# Patient Record
Sex: Female | Born: 1960
Health system: Southern US, Community
[De-identification: ages and names within clinical notes are randomized; demographics above are authoritative.]

## PROBLEM LIST (undated history)

## (undated) DIAGNOSIS — K760 Fatty (change of) liver, not elsewhere classified: Secondary | ICD-10-CM

## (undated) DIAGNOSIS — F32A Depression, unspecified: Secondary | ICD-10-CM

## (undated) DIAGNOSIS — M1990 Unspecified osteoarthritis, unspecified site: Secondary | ICD-10-CM

## (undated) DIAGNOSIS — E119 Type 2 diabetes mellitus without complications: Secondary | ICD-10-CM

## (undated) DIAGNOSIS — C801 Malignant (primary) neoplasm, unspecified: Secondary | ICD-10-CM

## (undated) DIAGNOSIS — T8859XA Other complications of anesthesia, initial encounter: Secondary | ICD-10-CM

## (undated) DIAGNOSIS — F419 Anxiety disorder, unspecified: Secondary | ICD-10-CM

## (undated) DIAGNOSIS — E049 Nontoxic goiter, unspecified: Secondary | ICD-10-CM

## (undated) DIAGNOSIS — D649 Anemia, unspecified: Secondary | ICD-10-CM

## (undated) HISTORY — DX: Type 2 diabetes mellitus without complications: E11.9

## (undated) HISTORY — PX: TONSILLECTOMY: SUR1361

## (undated) HISTORY — PX: PARTIAL HYSTERECTOMY: SHX80

## (undated) HISTORY — PX: BOWEL RESECTION: SHX1257

---

## 2001-04-11 ENCOUNTER — Encounter: Payer: Self-pay | Admitting: Emergency Medicine

## 2001-04-11 ENCOUNTER — Emergency Department (HOSPITAL_COMMUNITY): Admission: EM | Admit: 2001-04-11 | Discharge: 2001-04-11 | Payer: Self-pay | Admitting: Emergency Medicine

## 2001-05-14 ENCOUNTER — Ambulatory Visit (HOSPITAL_COMMUNITY): Admission: RE | Admit: 2001-05-14 | Discharge: 2001-05-14 | Payer: Self-pay | Admitting: Obstetrics and Gynecology

## 2001-05-14 ENCOUNTER — Encounter (INDEPENDENT_AMBULATORY_CARE_PROVIDER_SITE_OTHER): Payer: Self-pay

## 2001-09-05 ENCOUNTER — Emergency Department (HOSPITAL_COMMUNITY): Admission: EM | Admit: 2001-09-05 | Discharge: 2001-09-05 | Payer: Self-pay | Admitting: Emergency Medicine

## 2001-09-07 ENCOUNTER — Emergency Department (HOSPITAL_COMMUNITY): Admission: EM | Admit: 2001-09-07 | Discharge: 2001-09-07 | Payer: Self-pay | Admitting: Emergency Medicine

## 2002-03-04 ENCOUNTER — Encounter: Admission: RE | Admit: 2002-03-04 | Discharge: 2002-03-04 | Payer: Self-pay | Admitting: Family Medicine

## 2003-03-02 ENCOUNTER — Encounter: Admission: RE | Admit: 2003-03-02 | Discharge: 2003-03-02 | Payer: Self-pay | Admitting: Sports Medicine

## 2004-01-10 ENCOUNTER — Emergency Department (HOSPITAL_COMMUNITY): Admission: EM | Admit: 2004-01-10 | Discharge: 2004-01-10 | Payer: Self-pay | Admitting: Emergency Medicine

## 2004-08-03 ENCOUNTER — Encounter: Admission: RE | Admit: 2004-08-03 | Discharge: 2004-08-03 | Payer: Self-pay | Admitting: Family Medicine

## 2004-10-06 ENCOUNTER — Emergency Department (HOSPITAL_COMMUNITY): Admission: EM | Admit: 2004-10-06 | Discharge: 2004-10-07 | Payer: Self-pay | Admitting: Emergency Medicine

## 2005-02-13 ENCOUNTER — Ambulatory Visit: Payer: Self-pay | Admitting: Family Medicine

## 2007-06-23 ENCOUNTER — Ambulatory Visit: Payer: Self-pay | Admitting: Family Medicine

## 2008-09-19 ENCOUNTER — Ambulatory Visit: Payer: Self-pay | Admitting: Family Medicine

## 2008-09-19 ENCOUNTER — Telehealth: Payer: Self-pay | Admitting: *Deleted

## 2008-09-19 ENCOUNTER — Encounter: Payer: Self-pay | Admitting: Family Medicine

## 2008-09-19 LAB — CONVERTED CEMR LAB: Beta hcg, urine, semiquantitative: NEGATIVE

## 2008-09-20 LAB — CONVERTED CEMR LAB
HCT: 37 % (ref 36.0–46.0)
Hemoglobin: 10.9 g/dL — ABNORMAL LOW (ref 12.0–15.0)
MCHC: 29.5 g/dL — ABNORMAL LOW (ref 30.0–36.0)
MCV: 73.9 fL — ABNORMAL LOW (ref 78.0–100.0)
Platelets: 428 10*3/uL — ABNORMAL HIGH (ref 150–400)
RBC: 5.01 M/uL (ref 3.87–5.11)
RDW: 18.2 % — ABNORMAL HIGH (ref 11.5–15.5)
TSH: 1.759 microintl units/mL (ref 0.350–4.50)
WBC: 5.7 10*3/uL (ref 4.0–10.5)

## 2008-09-23 ENCOUNTER — Encounter: Admission: RE | Admit: 2008-09-23 | Discharge: 2008-09-23 | Payer: Self-pay | Admitting: Family Medicine

## 2008-09-28 ENCOUNTER — Ambulatory Visit: Payer: Self-pay | Admitting: Family Medicine

## 2008-09-28 DIAGNOSIS — D509 Iron deficiency anemia, unspecified: Secondary | ICD-10-CM | POA: Insufficient documentation

## 2008-09-29 ENCOUNTER — Inpatient Hospital Stay (HOSPITAL_COMMUNITY): Admission: AD | Admit: 2008-09-29 | Discharge: 2008-09-29 | Payer: Self-pay | Admitting: Obstetrics & Gynecology

## 2008-09-29 ENCOUNTER — Telehealth: Payer: Self-pay | Admitting: *Deleted

## 2008-10-03 ENCOUNTER — Telehealth: Payer: Self-pay | Admitting: *Deleted

## 2008-10-06 ENCOUNTER — Encounter: Payer: Self-pay | Admitting: *Deleted

## 2008-11-23 ENCOUNTER — Other Ambulatory Visit: Admission: RE | Admit: 2008-11-23 | Discharge: 2008-11-23 | Payer: Self-pay | Admitting: Obstetrics and Gynecology

## 2008-11-23 ENCOUNTER — Ambulatory Visit: Payer: Self-pay | Admitting: Obstetrics and Gynecology

## 2008-11-23 ENCOUNTER — Encounter (INDEPENDENT_AMBULATORY_CARE_PROVIDER_SITE_OTHER): Payer: Self-pay | Admitting: Family Medicine

## 2008-12-30 ENCOUNTER — Ambulatory Visit: Payer: Self-pay | Admitting: Obstetrics & Gynecology

## 2009-05-12 ENCOUNTER — Encounter: Payer: Self-pay | Admitting: *Deleted

## 2009-05-29 ENCOUNTER — Ambulatory Visit: Payer: Self-pay | Admitting: Family Medicine

## 2009-05-29 DIAGNOSIS — E669 Obesity, unspecified: Secondary | ICD-10-CM | POA: Insufficient documentation

## 2009-05-29 LAB — CONVERTED CEMR LAB
ALT: 13 units/L (ref 0–35)
AST: 17 units/L (ref 0–37)
Albumin: 3.9 g/dL (ref 3.5–5.2)
Alkaline Phosphatase: 44 units/L (ref 39–117)
BUN: 14 mg/dL (ref 6–23)
CO2: 22 meq/L (ref 19–32)
Calcium: 8.8 mg/dL (ref 8.4–10.5)
Chloride: 108 meq/L (ref 96–112)
Cholesterol: 143 mg/dL (ref 0–200)
Creatinine, Ser: 0.71 mg/dL (ref 0.40–1.20)
Glucose, Bld: 103 mg/dL — ABNORMAL HIGH (ref 70–99)
HCT: 32.4 % — ABNORMAL LOW (ref 36.0–46.0)
HDL: 40 mg/dL (ref >39)
Hemoglobin: 9.3 g/dL — ABNORMAL LOW (ref 12.0–15.0)
LDL Cholesterol: 72 mg/dL (ref 0–99)
MCHC: 28.7 g/dL — ABNORMAL LOW (ref 30.0–36.0)
MCV: 72.3 fL — ABNORMAL LOW (ref 78.0–100.0)
Platelets: 407 10*3/uL — ABNORMAL HIGH (ref 150–400)
Potassium: 4.3 meq/L (ref 3.5–5.3)
RBC: 4.48 M/uL (ref 3.87–5.11)
RDW: 20.1 % — ABNORMAL HIGH (ref 11.5–15.5)
Sodium: 140 meq/L (ref 135–145)
Total Bilirubin: 0.3 mg/dL (ref 0.3–1.2)
Total CHOL/HDL Ratio: 3.6
Total Protein: 6.9 g/dL (ref 6.0–8.3)
Triglycerides: 156 mg/dL — ABNORMAL HIGH (ref <150)
VLDL: 31 mg/dL (ref 0–40)
WBC: 4.2 10*3/uL (ref 4.0–10.5)

## 2009-05-30 ENCOUNTER — Encounter: Payer: Self-pay | Admitting: Family Medicine

## 2009-06-07 ENCOUNTER — Encounter: Payer: Self-pay | Admitting: Family Medicine

## 2009-06-28 ENCOUNTER — Telehealth: Payer: Self-pay | Admitting: Family Medicine

## 2009-07-28 ENCOUNTER — Ambulatory Visit: Payer: Self-pay | Admitting: Obstetrics & Gynecology

## 2009-08-17 ENCOUNTER — Ambulatory Visit: Payer: Self-pay | Admitting: Obstetrics & Gynecology

## 2009-08-21 ENCOUNTER — Ambulatory Visit (HOSPITAL_COMMUNITY): Admission: RE | Admit: 2009-08-21 | Discharge: 2009-08-21 | Payer: Self-pay | Admitting: Obstetrics & Gynecology

## 2009-09-01 ENCOUNTER — Encounter (INDEPENDENT_AMBULATORY_CARE_PROVIDER_SITE_OTHER): Payer: Self-pay | Admitting: *Deleted

## 2009-09-08 ENCOUNTER — Ambulatory Visit: Payer: Self-pay | Admitting: Family Medicine

## 2009-09-11 ENCOUNTER — Ambulatory Visit: Payer: Self-pay | Admitting: Family Medicine

## 2009-09-21 ENCOUNTER — Encounter: Payer: Self-pay | Admitting: Obstetrics & Gynecology

## 2009-09-21 ENCOUNTER — Ambulatory Visit: Payer: Self-pay | Admitting: Obstetrics & Gynecology

## 2009-09-21 ENCOUNTER — Inpatient Hospital Stay (HOSPITAL_COMMUNITY): Admission: RE | Admit: 2009-09-21 | Discharge: 2009-09-23 | Payer: Self-pay | Admitting: Obstetrics & Gynecology

## 2009-10-12 ENCOUNTER — Ambulatory Visit: Payer: Self-pay | Admitting: Obstetrics and Gynecology

## 2009-10-18 ENCOUNTER — Ambulatory Visit: Payer: Self-pay | Admitting: Obstetrics & Gynecology

## 2009-10-18 ENCOUNTER — Inpatient Hospital Stay (HOSPITAL_COMMUNITY): Admission: AD | Admit: 2009-10-18 | Discharge: 2009-10-18 | Payer: Self-pay | Admitting: Obstetrics & Gynecology

## 2009-11-02 ENCOUNTER — Ambulatory Visit: Payer: Self-pay | Admitting: Obstetrics and Gynecology

## 2011-01-21 ENCOUNTER — Ambulatory Visit: Admission: RE | Admit: 2011-01-21 | Discharge: 2011-01-21 | Payer: Self-pay | Source: Home / Self Care

## 2011-01-21 ENCOUNTER — Encounter: Payer: Self-pay | Admitting: Family Medicine

## 2011-01-21 LAB — CONVERTED CEMR LAB
Glucose, Bld: 116 mg/dL — ABNORMAL HIGH (ref 70–99)
HCT: 38.3 % (ref 36.0–46.0)
Hemoglobin: 12.3 g/dL (ref 12.0–15.0)
MCHC: 32.1 g/dL (ref 30.0–36.0)
MCV: 82.2 fL (ref 78.0–100.0)
Platelets: 353 10*3/uL (ref 150–400)
RBC: 4.66 M/uL (ref 3.87–5.11)
RDW: 15.4 % (ref 11.5–15.5)
WBC: 6 10*3/uL (ref 4.0–10.5)

## 2011-01-21 LAB — CBC
HCT: 38.3 % (ref 36.0–46.0)
Hemoglobin: 12.3 g/dL (ref 12.0–15.0)
MCH: 26.4 pg (ref 26.0–34.0)
MCHC: 32.1 g/dL (ref 30.0–36.0)
MCV: 82.2 fL (ref 78.0–100.0)
Platelets: 353 10*3/uL (ref 150–400)
RBC: 4.66 MIL/uL (ref 3.87–5.11)
RDW: 15.4 % (ref 11.5–15.5)
WBC: 6 10*3/uL (ref 4.0–10.5)

## 2011-01-22 ENCOUNTER — Other Ambulatory Visit: Payer: Self-pay | Admitting: Family Medicine

## 2011-01-22 ENCOUNTER — Encounter: Payer: Self-pay | Admitting: Family Medicine

## 2011-01-22 LAB — GLUCOSE, RANDOM: Glucose, Bld: 116 mg/dL — ABNORMAL HIGH (ref 70–99)

## 2011-01-22 NOTE — Assessment & Plan Note (Signed)
Summary: f/u for uterine fibroid   Vital Signs:  Patient Profile:   50 Years Old Female Height:     66 inches (167.64 cm) Weight:      253.9 pounds BMI:     41.13 Temp:     98.3 degrees F oral Pulse rate:   72 / minute BP sitting:   123 / 82  (right arm)  Pt. in pain?   yes    Location:   RUQ    Intensity:   6  Vitals Entered By: Dedra Skeens CMA, (September 28, 2008 8:48 AM)                  PCP:  Pearlean Brownie MD  Chief Complaint:  f/u abd pain/menorrhagia.  History of Present Illness: 50yo AAF here for f/u abd pain/menorrhagia.  Abd pain/menorrhagia: s/p U/S that conveyed 5.3cm uterine fibroid.  Continues to have severe pain taking vicodin.  Continues to have heavy bleeding.  Currently does not intend to have future pregnancies.  Denies syncopal epidoses, fever, or SOB.      Current Allergies: No known allergies      Review of Systems      See HPI   Physical Exam  General:     Morbidly obese AAF, NAD    Impression & Recommendations:  Problem # 1:  FIBROIDS, UTERUS (ICD-218.9) Assessment: New 5.3 cm supracervical fibroid.  Discuss different options of tx: myomectomy vs. uterine artery emobliztion vs. hysterectomy vs. GnRH  vs. Mirena.  Plan to refer to gynecology.  Will provide Ultram for pain.  Will f/u after she has been seen and treated.   Orders: Gynecologic Referral (Gyn) [facility]- Est Level  3 ([address])   Problem # 2:  ANEMIA, IRON DEFICIENCY (ICD-280.9) Assessment: New Most likely 2/2 to iron deficiency given Hgb 10. 9 and MCV 73.9.  Will treat with Fe supplementation two times a day.     Her updated medication list for this problem includes:    Iron Supplement 325 (65 Fe) Mg Tabs (Ferrous sulfate) ..... One tablet by mouth two times a day  Orders: [facility]- Est Level  3 (60454)   Complete Medication List: 1)  Ultram 50 Mg Tabs (Tramadol hcl) .... One tablet by mouth every fours as needed for pain 2)  Iron Supplement 325 (65 Fe) Mg Tabs  (Ferrous sulfate) .... One tablet by mouth two times a day   Patient Instructions: 1)  Schedule f/u after you have been seen and treated by the gynecologist. 2)  Call the office if you have symptoms of passing out or fever (101).   Prescriptions: IRON SUPPLEMENT 325 (65 FE) MG TABS (FERROUS SULFATE) one tablet by mouth two times a day  #60 x 3   Entered and Authorized by:   Marisue Ivan  MD   Signed by:   Marisue Ivan  MD on 09/28/2008   Method used:   Handwritten   RxID:   [id number] ULTRAM 50 MG TABS (TRAMADOL HCL) one tablet by mouth every fours as needed for pain  #40 x 0   Entered and Authorized by:   Marisue Ivan  MD   Signed by:   Marisue Ivan  MD on 09/28/2008   Method used:   Handwritten   RxID:   [id number]  ]

## 2011-01-22 NOTE — Assessment & Plan Note (Signed)
Summary: cpe for work wk   Vital Signs:  Patient Profile:   50 Years Old Female Height:     66 inches (167.64 cm) Weight:      252 pounds (114.55 kg) BMI:     40.82 Temp:     97.9 degrees F (36.61 degrees C) Pulse rate:   71 / minute BP sitting:   126 / 85  (left arm)  Pt. in pain?   no  Vitals Entered By: Tomasa Rand (June 23, 2007 3:20 PM)                Chief Complaint:  CPE for work.  History of Present Illness: Need PE for change in jobs.  Feels well.  No problems except occaisional left arm tingling and aching.  Had for years.  Does not cause her any limitations.  Wants a female provider for a Pap smear  No chronic medications    Past Surgical History:    Tonsils as a child   Family History:    Colon Cancer in brother    Diabetes in nonfirst degree relative  Social History:    Will start to work at [employer] in [employer].     Risk Factors:  Tobacco use:  never   Review of Systems  The patient denies anorexia, weight loss, chest pain, syncope, dyspnea on exhertion, hemoptysis, abdominal pain, severe indigestion/heartburn, hematuria, muscle weakness, suspicious skin lesions, difficulty walking, depression, and enlarged lymph nodes.     Physical Exam  General:     Well-developed,well-nourished,in no acute distress; alert,appropriate and cooperative throughout examination Head:     Normocephalic and atraumatic without obvious abnormalities. No apparent alopecia or balding. Eyes:     No corneal or conjunctival inflammation noted. EOMI. Perrla. Vision grossly normal. Ears:     External ear exam shows no significant lesions or deformities.  Otoscopic examination reveals clear canals, tympanic membranes are intact bilaterally without bulging, retraction, inflammation or discharge. Hearing is grossly normal bilaterally. Mouth:     Oral mucosa and oropharynx without lesions or exudates.  Teeth in good repair. Neck:     No deformities, masses, or  tenderness noted. Lungs:     Normal respiratory effort, chest expands symmetrically. Lungs are clear to auscultation, no crackles or wheezes. Heart:     Normal rate and regular rhythm. S1 and S2 normal without gallop, murmur, click, rub or other extra sounds. Abdomen:     Bowel sounds positive,abdomen soft and non-tender without masses, organomegaly or hernias noted. Msk:     No deformity or scoliosis noted of thoracic or lumbar spine.   Extremities:     No clubbing, cyanosis, edema, or deformity noted with normal full range of motion of all joints.      Impression & Recommendations:  Problem # 1:  ROUTINE GENERAL MEDICAL EXAM, NON PEDIATRIC (ICD-V70.0) Normal exam.   Will make appointment to have Pap.  Discussed weight loss and exercise Orders: [facility] - Est  40-64 yrs (010[address])    Patient Instructions: 1)  Schedule appointment for a PAP smear 2)  You need to lose weight. Consider a lower calorie diet and regular exercise.  3)  It is important that you exercise regularly at least 20 minutes 5 times a week. If you develop chest pain, have severe difficulty breathing, or feel very tired , stop exercising immediately and seek medical attention. 4)  Schedule your mammogram.

## 2011-01-22 NOTE — Progress Notes (Signed)
Summary: phn msg  Phone Note Call from Patient Call back at Home Phone [phone]   Caller: Patient Summary of Call: [facility], but [facility] till [facility] and they told her to call her dr back and see if he wanted to refer her somewhere else. Initial call taken by: Clydell Hakim,  June 28, 2009 11:37 AM

## 2011-01-22 NOTE — Progress Notes (Signed)
Summary: Triage  Phone Note Call from Patient Call back at [phone]   Caller: Husband Summary of Call: Pts got a bad stomach pain and needs to be worked in - hurts to even touch. Initial call taken by: Haydee Salter,  September 19, 2008 9:19 AM  Follow-up for Phone Call        work in at 11am with Dr. Burnadette Pop Follow-up by: Golden Circle RN,  September 19, 2008 9:42 AM

## 2011-01-22 NOTE — Miscellaneous (Signed)
Summary: Physical Form  Patient dropped off a form to be filled out for her to take part in a [facility].  Please call her when it is ready to be picked up. Bradly Bienenstock  June 07, 2009 10:50 AM  form to pcp for completion...Marland KitchenMarland KitchenGolden Circle RN  June 07, 2009 12:45 PM   done Pearlean Brownie MD  June 08, 2009 2:22 PM

## 2011-01-22 NOTE — Assessment & Plan Note (Signed)
Summary: stomach pain   Vital Signs:  Patient Profile:   50 Years Old Female Height:     66 inches (167.64 cm) Weight:      250.1 pounds BMI:     40.51 Temp:     98.3 degrees F Pulse rate:   86 / minute BP sitting:   137 / 84  (right arm)  Pt. in pain?   yes    Location:   abdomen    Intensity:   6  Vitals Entered By: Starleen Blue RN (September 19, 2008 11:36 AM)                  PCP:  Pearlean Brownie MD  Chief Complaint:  abdominal pain & heavy menstrual bleeding.  History of Present Illness: 50yo AAF here w/ worsening abd pain and heavy menstrual bleeding.  Abd pain: Has been going on for at least 5 years but has worsened over the past few months.  States that it is cyclical occurring 2 weeks prior to the beginning of her menstrual period.  Pain is diffuse not associated with fevers, chills, N/V, or diarrhea.  She reports regular bowel movements, no bloody stools, no vaginal d//c, and no change in appetite.    Menorrhagia: States that her periods occur every 28 days, lasts for 7 days, and she usually goes through 3 thick pads three times per day.  She reports seeing clots.  Her LMP was 08/22/08.  She is not currently on any contraception.  She is not a smoker and does not know of any bleeding disorders that run in the family.  Denies any syncope, SOB, chest pain, HA, or dizziness.  Does endorse some weakness.  Also endorses very painful cramps during her period.  She is a G3P3 with 3 c-sections and hx of BTL in 1998.         Review of Systems      See HPI   Physical Exam  General:     Obese AAF, pleasant, cooperative, NAD Eyes:     conjuntiva are not pale Mouth:     Oral mucosa and oropharynx without lesions or exudates.  Teeth in good repair. Lungs:     Normal respiratory effort, chest expands symmetrically. Lungs are clear to auscultation, no crackles or wheezes. Heart:     Normal rate and regular rhythm. S1 and S2 normal without gallop, murmur, click,  rub or other extra sounds. Abdomen:     Guarded, soft, diffuse tenderness worse in right quadrant, no HSM, no rebound, +BS Pulses:     2+ radial and dp Extremities:     no edema    Impression & Recommendations:  Problem # 1:  MENORRHAGIA (ICD-626.2) Assessment: New Pt d/w and examined by Dr. [place].  Plan to check U Preg, CBC, and TSH.  Will send for transvaginal U/S.  If the U/S is negative, we will discuss the option of IUD to help with her symptoms.     Orders: CBC-[facility] (67893) U Preg-[facility] (81025) TSH-[facility] ([id number]) [facility]- Est  Level 4 (85277) Ultrasound (Ultrasound)   Problem # 2:  ABDOMINAL PAIN, CHRONIC (ICD-789.00) Assessment: New Given that this has been going on for 5 years, I doubt that this is an infectious process.  Given its cyclical nature and relation to her menstrual period, it most likely is gyn related.  Will first evaluate with U/S.  If that is negative and her symptoms continue, we'll consider a CT of the abd.  Orders: [facility]- Est  Level 4 (95093)   Problem # 3:  Preventive Health Care (ICD-V70.0) Assessment: Comment Only Pt is due for a pap smear.  She has agreed to have it done at her next visit.  Also due for mammogram.  Will address this with her once her acute symptoms are taken care of.  Will provide a flu shot today.    Problem # 4:  Dispo Assessment: Comment Only Will f/u in 3 weeks or sooner if needed.  Will need preventative screening measures addressed at next visit.  U/S and labs to be followed up.    Other Orders: Influenza Vaccine NON MCR (26712)   Patient Instructions: 1)  Follow up in 3 weeks or sooner if your symptoms worsen or if you have assoiciated fever. 2)  We have set you up for an ultrasound and will obtain lab work today.  I will call you if with any abnormal labs.   ]  Influenza Vaccine    Vaccine Type: Fluvax Non-MCR    Site: right deltoid    Mfr: GlaxoSmithKline    Dose: 0.5 ml    Route: IM    Given by: Starleen Blue RN    Exp. Date: 06/21/2009    Lot #: [id number]    VIS given: 07/16/07 version given September 19, 2008.  Flu Vaccine Consent Questions    Do you have a history of severe allergic reactions to this vaccine? no    Any prior history of allergic reactions to egg and/or gelatin? no    Do you have a sensitivity to the preservative Thimersol? no    Do you have a past history of Guillan-Barre Syndrome? no    Do you currently have an acute febrile illness? no    Have you ever had a severe reaction to latex? no    Vaccine information given and explained to patient? yes    Are you currently pregnant? no  Laboratory Results   Urine Tests  Date/Time Received: September 19, 2008 12:26 PM  Date/Time Reported: September 19, 2008 12:36 PM     Urine HCG: negative Comments: ...........test performed by...........Marland KitchenTerese Door, CMA

## 2011-01-22 NOTE — Progress Notes (Signed)
Summary: Jenny [name] - pls call back  Phone Note Call from Patient Call back at [phone]   Caller: husband-Kevin Summary of Call: took Ultram and Hydrocodeine(because of headache) and had to go to hospital at 4am this morning.  Needs to talk to dr about meds. Initial call taken by: De Nurse,  September 29, 2008 8:44 AM  Follow-up for Phone Call        Patient took Ultram given to her by Dr. Burnadette Pop yesterday and it gave her a horrible headache so 3-4 hrs later she took one of her husband Vicodin (500mg ). Patient then began having hot/cold flashes, hallucinations, tightness in chest and was taken to the ED. Pt husband wants MD to know that she needs something other than the Ultram (due to headaches) for pain and also wanted to know if patient needed to come back in to be re-evaluated. Message to MD Follow-up by: Juwuan Sedita LPN,  September 29, 2008 9:37 AM  Additional Follow-up for Phone Call Additional follow up Details #1::        [name], husband called again to ask if you had a chance to talk to Dr.- pls call him back.  Additional Follow-up by: De Nurse,  September 29, 2008 1:55 PM    Additional Follow-up for Phone Call Additional follow up Details #2::    Would stop the ultram.  Start diclofenac 75 mg two times a day  #30 NR for the pain and should help wit the bleeding.  If not better should see me next week.  Pls call this in to a pharmacy thanks [facility] Follow-up by: Pearlean Brownie MD,  September 29, 2008 3:35 PM  Additional Follow-up for Phone Call Additional follow up Details #3:: Details for Additional Follow-up Action Taken: Rx called into [facility] on [address]. Called and informed patient husaband of above. Additional Follow-up by: Hani Campusano LPN,  September 29, 2008 3:43 PM  New/Updated Medications: DICLOFENAC SODIUM 75 MG TBEC (DICLOFENAC SODIUM) 1 by mouth two times a day   Prescriptions: DICLOFENAC SODIUM 75 MG TBEC (DICLOFENAC SODIUM) 1 by mouth two times a day  #30  x 0   Entered and Authorized by:   Pearlean Brownie MD   Signed by:   Pearlean Brownie MD on 09/29/2008   Method used:   Telephoned to ...         RxID:   [id number]

## 2011-01-22 NOTE — Assessment & Plan Note (Signed)
Summary: CPP/EO   Vital Signs:  Patient profile:   50 year old female Height:      66 inches Weight:      252.7 pounds Temp:     97.9 degrees F Pulse rate:   70 / minute BP sitting:   119 / 70  (left arm)  Vitals Entered By: Theresia Lo RN (May 29, 2009 9:19 AM) CC: CPE Is Patient Diabetic? No Pain Assessment Patient in pain? yes     Location: low abdomen Intensity: 2 Type: ache   Primary Care Provider:  Pearlean Brownie MD  CC:  CPE.  History of Present Illness: Feels well except for vaginal bleeding and pain.  Is going to schedule a hysterectomy  Obesity has attempted diets in the past.  Drinks sweet sodas and lots of carbs  Anemia never started iron tablets  Edema bilateral lower extremities.  Much better in the AM.  No orthopnea or PND.  Has history of anemia due to vaginal bleeding.    Habits & Providers  Alcohol-Tobacco-Diet     Tobacco Status: never  Current Medications (verified): 1)  Iron Supplement 325 (65 Fe) Mg Tabs (Ferrous Sulfate) .... 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    Physical Exam  General:  Well-developed,well-nourished,in no acute distress; alert,appropriate and cooperative throughout examination Obese Nose:  External nasal examination shows no deformity or inflammation. Nasal mucosa are pink and moist without lesions  or exudates. Mouth:  Oral mucosa and oropharynx without lesions or exudates.  Teeth in good repair. Neck:  No deformities, masses, or tenderness noted. Lungs:  Normal respiratory effort, chest expands symmetrically. Lungs are clear to auscultation, no crackles or wheezes. Heart:  Normal rate and regular rhythm. S1 and S2 normal without gallop, murmur, click, rub or other extra sounds. Abdomen:  Bowel sounds positive,abdomen soft and non-tender without masses, organomegaly or hernias noted. Extremities:  1 + pitting edema at ankles Neurologic:  . Skin:  Intact without suspicious lesions or rashes Cervical Nodes:  No lymphadenopathy noted Psych:  Cognition and judgment appear intact. Alert and cooperative with normal attention span and concentration. No apparent delusions, illusions, hallucinations   Impression & Recommendations:  Problem # 1:  OBESITY (ICD-278.00) Discussed approaches to weight loss.  She has several ideas  Problem # 2:  EDEMA- LOCALIZED (ICD-782.3) Very likely venous insuffiency.  No red flags for cancer or dvt.  Check labs for anemia.  Encouraged weight loss salt restriction and elevation  Orders: Comp Met-[facility] ([id number])  Problem # 3:  ROUTINE GENERAL MEDICAL EXAM, NON PEDIATRIC (ICD-V70.0)  Check lipids.  Recommended mammogram  Orders: [facility] - Est  40-64 yrs (35573)  Complete Medication List: 1)  Iron Supplement 325 (65 Fe) Mg Tabs (Ferrous sulfate) .... One tablet by mouth two times a day  Other Orders: Lipid-[facility] ([id number]) CBC-[facility] (23762)  Patient Instructions: 1)  Please schedule a follow-up appointment in 1 year.  2)  I will call you if your lab is abnormal otherwise I will send you a letter  within 2 weeks. 3)  Weight control is key for your heart and swelling and joints. 4)  Start with stopping sweet sodas and plan your diet 5)  It is important that you exercise reguarly at least 20 minutes 5 times a week. If you develop chest pain, have severe  difficulty breathing, or feel very tired, stop exercising immediately and seek medical attention.  6)  Schedule your mammogram.     Vital Signs:  Patient profile:   50 year old female Height:      66 inches Weight:      252.7 pounds Temp:     97.9 degrees F Pulse rate:   70 / minute BP sitting:   119 / 70  (left arm)  Vitals Entered By: Theresia Lo RN (May 29, 2009 9:19 AM)

## 2011-01-22 NOTE — Miscellaneous (Signed)
Summary: dnka/ts  Clinical Lists Changes 

## 2011-01-22 NOTE — Progress Notes (Signed)
Summary: status of appt  Phone Note Call from Patient Call back at [phone]   Reason for Call: Talk to Nurse Summary of Call[facility] Initial call taken by: Knox Royalty,  October 03, 2008 10:15 AM  Follow-up for Phone Call        Looked up in [facility] appt is for 12.2.09 @ 1:15pm.  Pt husband informed Follow-up by: Jone Baseman CMA,  October 03, 2008 3:37 PM

## 2011-01-22 NOTE — Assessment & Plan Note (Signed)
Summary: read ppd/kh  Nurse Visit    Allergies: No Known Drug Allergies  PPD Results    Date of reading: 09/11/2009    Results: 0    Interpretation: negative  Orders Added: 1)  No Charge Patient Arrived (NCPA0) [NCPA0]   form given to patient. Theresia Lo RN  September 11, 2009 8:51 AM

## 2011-01-30 NOTE — Assessment & Plan Note (Signed)
Summary: cpe,df   Vital Signs:  Patient profile:   50 year old female Height:      66 inches Weight:      256 pounds BMI:     41.47 BSA:     2.22 Temp:     97.9 degrees F Pulse rate:   72 / minute BP sitting:   108 / 86  Vitals Entered By: Jone Baseman CMA (January 21, 2011 3:33 PM) CC: cpe Is Patient Diabetic? No Pain Assessment Patient in pain? no        Primary Care Provider:  Pearlean Brownie MD  CC:  cpe.  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 No apparent alopecia or balding. Eyes:  No corneal or conjunctival inflammation noted. EOMI. Perrla. Funduscopic exam benign, without hemorrhages, exudates or papilledema. Vision grossly normal. Nose:  External nasal examination shows no deformity or inflammation. Nasal mucosa are pink and moist without lesions or exudates. Mouth:  Oral mucosa and oropharynx without lesions or exudates.  Teeth in good repair. Neck:  No deformities, masses, or tenderness noted. Breasts:  No mass, nodules, thickening, tenderness, bulging, retraction, inflamation, nipple discharge or skin changes noted.   Lungs:  Normal respiratory effort, chest expands symmetrically. Lungs are clear to auscultation, no crackles or wheezes. Heart:  Normal rate and regular rhythm. S1 and S2 normal without gallop, murmur, click, rub or other extra sounds. Abdomen:  Bowel sounds positive,abdomen soft and  non-tender without masses, organomegaly or hernias noted.Obese Genitalia:  Normal introitus for age, no external lesions, no vaginal discharge, mucosa pink and moist, no vaginal or cervical lesions, no vaginal atrophy, no friaility or hemorrhage,Unable to feel ovaries.  Cervix feels normal size  Msk:  No deformity or scoliosis noted of thoracic or lumbar spine.   Extremities:  No clubbing, cyanosis, edema, or deformity noted with normal full range of motion of all joints.     Impression & Recommendations:  Problem # 1:  OBESITY (ICD-278.00) Her major health issue.  we discusssed importance and approaches   Other Orders: CBC-[facility] ([address]) Glucose-[facility] ([id number]) Pap Smear-[facility] ([id number]) [facility] - Est  40-64 yrs (13086)  Patient Instructions: 1)  Please schedule a follow-up appointment in 1 year.  2)  I will call you if your lab is abnormal otherwise I will send you a letter within 2 weeks. 3)  Weight and diet control is the key to your health   Orders Added: 1)  CBC-[facility] [85027] 2)  Glucose-[facility] [[id number]] 3)  Pap Smear-[facility] [[id number]] 4)  [facility] - Est  40-64 yrs [02725]

## 2011-01-30 NOTE — Letter (Signed)
Summary: Generic Letter  [facility]  [address]   [place], [place] [address]   Phone: [phone]  Fax: [phone]    01/22/2011  Jenny [name] [address], [place]  [address]  Dear [name],  Your labs are good except your blood sugar.  You do not have diabetes  now but you may be heading toward that.  Working on your weight is the way to help this.     WBC                       6.0 K/uL                    4.0-10.5   RBC                       4.66 MIL/uL                 3.87-5.11   Hemoglobin                12.3 g/dL                   57.8-46.9   Hematocrit                38.3 %                      36.0-46.0   MCV                       82.2 fL                     78.0-100.0 ! MCH                       26.4 pg                     26.0-34.0   MCHC                      32.1 g/dL                   62.9-52.8   RDW                       15.4 %                      11.5-15.5   Platelet Count            353 K/uL                    150-400  Tests: (2) Glucose (23040)   Glucose              [H]  116 mg/dL                   41-32    Sincerely,   Pearlean Brownie MD  Appended Document: Generic Letter mailed

## 2011-01-30 NOTE — Letter (Signed)
Summary: Generic Letter  [facility]  [address]   [place], [place] [address]   Phone: [phone]  Fax: [phone]    01/21/2011  SALEM MASTROGIOVANNI [address] [place], [place]  [address]  To Whom It May Concern.   Mrs. Womach is healthy and I see no reason should would not be able to adopt.   Sincerely,   Pearlean Brownie MD

## 2011-03-28 LAB — URINALYSIS, ROUTINE W REFLEX MICROSCOPIC
Bilirubin Urine: NEGATIVE
Glucose, UA: NEGATIVE mg/dL
Ketones, ur: NEGATIVE mg/dL
Leukocytes, UA: NEGATIVE
Nitrite: NEGATIVE
Protein, ur: NEGATIVE mg/dL
Specific Gravity, Urine: 1.025 (ref 1.005–1.030)
Urobilinogen, UA: 0.2 mg/dL (ref 0.0–1.0)
pH: 5 (ref 5.0–8.0)

## 2011-03-28 LAB — URINE MICROSCOPIC-ADD ON

## 2011-03-28 LAB — CBC
HCT: 32.1 % — ABNORMAL LOW (ref 36.0–46.0)
Hemoglobin: 10.6 g/dL — ABNORMAL LOW (ref 12.0–15.0)
MCHC: 33.2 g/dL (ref 30.0–36.0)
MCV: 78.8 fL (ref 78.0–100.0)
Platelets: 322 10*3/uL (ref 150–400)
RBC: 4.07 MIL/uL (ref 3.87–5.11)
RDW: 20.8 % — ABNORMAL HIGH (ref 11.5–15.5)
WBC: 11.1 10*3/uL — ABNORMAL HIGH (ref 4.0–10.5)

## 2011-03-28 LAB — DIFFERENTIAL
Basophils Absolute: 0 10*3/uL (ref 0.0–0.1)
Basophils Relative: 0 % (ref 0–1)
Eosinophils Absolute: 0 10*3/uL (ref 0.0–0.7)
Eosinophils Relative: 0 % (ref 0–5)
Lymphocytes Relative: 7 % — ABNORMAL LOW (ref 12–46)
Lymphs Abs: 0.7 10*3/uL (ref 0.7–4.0)
Monocytes Absolute: 0.6 10*3/uL (ref 0.1–1.0)
Monocytes Relative: 5 % (ref 3–12)
Neutro Abs: 9.7 10*3/uL — ABNORMAL HIGH (ref 1.7–7.7)
Neutrophils Relative %: 88 % — ABNORMAL HIGH (ref 43–77)

## 2011-03-28 LAB — HEMOCCULT GUIAC POC 1CARD (OFFICE): Fecal Occult Bld: NEGATIVE

## 2011-03-29 LAB — CROSSMATCH
ABO/RH(D): A POS
Antibody Screen: NEGATIVE

## 2011-03-29 LAB — CBC
HCT: 31.2 % — ABNORMAL LOW (ref 36.0–46.0)
Hemoglobin: 10 g/dL — ABNORMAL LOW (ref 12.0–15.0)
MCHC: 32.1 g/dL (ref 30.0–36.0)
MCV: 72.9 fL — ABNORMAL LOW (ref 78.0–100.0)
Platelets: 383 10*3/uL (ref 150–400)
RBC: 4.28 MIL/uL (ref 3.87–5.11)
RDW: 20.2 % — ABNORMAL HIGH (ref 11.5–15.5)
WBC: 4.6 10*3/uL (ref 4.0–10.5)

## 2011-03-29 LAB — ABO/RH: ABO/RH(D): A POS

## 2011-05-07 NOTE — Group Therapy Note (Signed)
NAMECHIRSTINA, [name] NO.:  [id number]   MEDICAL RECORD NO.:  [id number]          PATIENT TYPE:  WOC   LOCATION:  [facility]                   FACILITY:  [facility]   PHYSICIAN:  Jaynie Collins, MD     DATE OF BIRTH:  1961/10/02   DATE OF SERVICE:  08/17/2009                                  CLINIC NOTE   CHIEF COMPLAINT:  Pelvic pain, continued menorrhagia.   HISTORY OF PRESENT ILLNESS:  The patient is a 50 year old, gravida 3,  para 3, with a long history of chronic pelvic pain which has been  attributed to her large fibroid uterus.  The patient was noted to have  an 18-week size uterus and an ultrasound that was done in October 2009.  Around the same time, she reported menorrhagia and she underwent an  endometrial biopsy in December 2009, which was benign. After that  evaluation, she was on cyclic Provera, but did not say that this  alleviated her symptoms.  She is now interested in definitive surgical  management.  Of note, the patient has a history of three cesarean  sections and has had a bilateral tubal ligation.  Apart from her pelvic  pain and menorrhagia, the patient has no other concerns at this point.   PAST OB/GYN HISTORY:  Menorrhagia as above.  The patient has had three  cesarean sections and a bilateral tubal ligation.   PAST MEDICAL HISTORY:  Obesity.   PAST SURGICAL HISTORY:  Three cesarean sections and a bilateral tubal  ligation.   MEDICATIONS:  Advil as needed for pain.   ALLERGIES:  No known drug allergies.   SOCIAL HISTORY:  The patient is employed.  She is currently engaged and  will be married in December.  She is a nonsmoker and nondrinker, and  does not abuse any illicit drugs.  Denies any past or current history of  sexual or physical abuse.   FAMILY HISTORY:  Positive for diabetes and a cancer of unknown type in  her aunt.   PHYSICAL EXAMINATION:  VITAL SIGNS:  Temperature 98.1, pulse 71, blood  pressure 112/75, weight 254.3  pounds, and height 65 inches.  GENERAL:  No apparent distress.  HEART:  Regular rate and rhythm.  LUNGS:  Clear to auscultation bilaterally.  ABDOMEN:  Soft.  Large fibroid uterus was able to be palpated above the  umbilicus and had some moderate tenderness on palpation.  No rebound or  guarding.  PELVIC:  Deferred as per patient request.  EXTREMITIES:  No cyanosis, clubbing, or edema.   ASSESSMENT/PLAN:  The patient is a 50 year old, gravida 3, para 3, here  for preoperative consult for management of her fibroid uterus.  The  patient is desiring definitive surgical management.  She does have a  history of three cesarean section.  Given the size of her uterus, the  patient was counseled about a total abdominal hysterectomy, needed to go  through a vertical incision.  The patient was also given options of  using Depo Lupron to see if this could shrink her fibroids and also help  with intraoperative blood loss, but the patient  is not interested in  this method as she does want definitive surgery.  The risks of surgery  including but not limited to bleeding which might require transfusion;  infection requiring antibiotics; injury to surrounding organs including  intestines, bladder, ureters; need for additional procedures; increased  risk of adhesive disease especially given her three prior cesarean  sections; likelihood of having cervix left in place due to surgical  difficulty and adhesive disease; and other postoperative complications  were discussed with the patient.  The patient was also given written  information from the ACOG website regarding what to expect from a  hysterectomy.  She was also given a prescription for Vicodin to use as  she says that her pain is not alleviated by Advil.  The patient does not  currently need another endometrial biopsy as her last one was negative.  She will get an MRI to evaluate her uterus and also to evaluate for  possible organ compression like  evaluating her kidneys for possible  hydronephrosis or dye effects.  The patient was told to use a Fleet  Enema the night before surgery for bowel prep.  She was told that she  will be contacted by a surgical scheduler regarding the time and date of  her surgery, and told to come to me for any further concerns prior to  surgery.           ______________________________  Jaynie Collins, MD     UA/MEDQ  D:  08/17/2009  T:  08/18/2009  Job:  [id number]

## 2011-05-07 NOTE — Group Therapy Note (Signed)
[name], ABDELRAHMAN NO.:  [id number]   MEDICAL RECORD NO.:  [id number]          PATIENT TYPE:  WOC   LOCATION:  [facility]                   FACILITY:  [facility]   PHYSICIAN:  Dorthula Perfect, MD     DATE OF BIRTH:  1961-03-21   DATE OF SERVICE:  07/28/2009                                  CLINIC NOTE   A 50 year old black female was seen here in December 2009 and had an  endometrial biopsy.  This reveal benign secretory endometrium.  Her main  problem is that of a very heavy period with an awful lot of pain.  Her  periods are fairly cyclic, but heavy.  She uses 3 pads at a time and  changes them a number of times during the day.  She has staining of her  clothing and bed sheets.  In addition, a little bit of abdominal pain  begins about 2 weeks before the onset of her menstrual flow and does not  go away until her period ends.  After that she has just tenderness in  her lower abdomen.  She had an ultrasound done at [facility] in October of last year which revealed an enlarged  uterus with at least 1 fibroid that was 5.3 cm.  Uterus was enlarged  measuring 17.8 cm long.   She used Provera in the early part of this year on a cyclic basis, but  this did not help at all.  Her main problem is that of the heavy  bleeding as well as the problem with pain.   Abdomen is obese.  A firm area is palpable up to within about 3 cm of  the umbilicus.  Because of her abdominal size, it is hard to outline the  uterus, it is done on abdominal exam.  Bimanual exam, external  genitalia, BUS, Skene glands are normal.  Vaginal vault epithelialized  as well as the cervix.  Her Pap smear December 2009 was negative.  Bimanual exam reveals the uterus to be probably 14 weeks' size,  multinodular, and very tender.  Separate ovaries are not palpable  because of her body habitus.   IMPRESSION:  Symptomatic menorrhagia, symptomatic dysmenorrhea, and  uterine  fibroids.   DISPOSITION:  She will come back the next few weeks to see one of the  GYN surgical folks to evaluate for hysterectomy.  She will use ibuprofen  in the meantime.           ______________________________  Dorthula Perfect, MD     ER/MEDQ  D:  07/28/2009  T:  07/29/2009  Job:  [id number]

## 2011-05-07 NOTE — Group Therapy Note (Signed)
Jenny [name], Jenny [name]   MEDICAL RECORD NO.:  [id number]          PATIENT TYPE:  WOC   LOCATION:  [facility]                   FACILITY:  [facility]   PHYSICIAN:  Maylon Cos, CNM    DATE OF BIRTH:  [birth year]   DATE OF SERVICE:                                  CLINIC NOTE   CHIEF COMPLAINT:  Follow-up results from endometrial biopsy that was  performed by Dr. Odie Sera on November 23, 2008.   HISTORY OF PRESENT ILLNESS:  Normal results of this endometrial biopsy  were shared with the patient, benign secretory endometrium with no  hyperplasia or malignancy were identified, also negative Pap smear  result was also shared with patient.  Plan by Dr. Odie Sera was for  the patient to began taking 10 mg of Provera daily for 20 days and then  to reassess her symptoms.  She was also given a prescription for 800 mg  of ibuprofen to be taken for menstrual cramping.  The patient states  that she had not filled either of these prescriptions and all of her  symptoms are still the same secondary to a family tragedy that prevented  her from filling the prescriptions and starting them.   ASSESSMENT:  Uterine fibroids and menorrhagia.   PLAN:  The patient was instructed to fill the prescriptions for the 10  mg Provera and to use it daily for 20 days with her next period which  she is expecting at the end of January.  The patient should also fill  the prescription for her ibuprofen 100 mg to take as directed as needed  for severe pain.  We will assess the effectiveness of this medical  regimen at the completion of the prescription of Provera.  The patient  is instructed to follow up at the end of February which coincides with  completing this medication and her next menstrual period.           ______________________________  Maylon Cos, CNM     SS/MEDQ  D:  12/30/2008  T:  12/30/2008  Job:  [id number]

## 2011-05-07 NOTE — Group Therapy Note (Signed)
NAMESYRENITY, KLEPACKI NO.:  [id number]   MEDICAL RECORD NO.:  [id number]          PATIENT TYPE:  WOC   [facility]ATION[facility]                   FACILITY:  [facility]   PHYSICIAN:  Odie Sera, D.O.    DATE OF BIRTH:  [birth year]   DATE OF SERVICE:  11/23/2008                                  CLINIC NOTE   CHIEF COMPLAINT:  Pelvic pain and menorrhagia.   REFERRED BY:  Dr. Jonn Shingles with the [facility] for evaluation of menorrhagia, pelvic pain, and uterine fibroids.   HISTORY OF PRESENT ILLNESS:  Ms. Klinger is a 50 year old African  American female who presents with at least five years of pelvic pain  that is cyclical, occurring two weeks prior to the beginning of her  menstrual period and continues throughout her period, is cramping in  nature.  She uses Pamprin to try to relieve the pain, but that does not  really work.  She also has a history of menorrhagia that she reports has  been present since she can remember, but it has been worsening.  Her  periods, her entire life, have occurred every 28 days, lasting for about  5 days.  They have always been heavy.  She has to use 3 pads at 1 time  or she will bleed through her clothes.  She reports passing clots.  For  the past several months, her menstrual periods have been occurring at  longer intervals, every 30 to 32 days, and have been lasting longer for  a full 7 days and they have also become heavier.  She reports she can  also feel her uterus just below her umbilicus and her husband concurs.  She does not have any bleeding between periods.  She does not know of  any bleeding disorders that run in her family.  She denies syncope,  shortness of breath, chest pain, headache, and dizziness.   She is currently sexually active with her husband only and has had a BTL  for contraception.  The first day of her last menstrual period was today  11/23/2008.  On 09/23/2008 she had a pelvic  ultrasound, transabdominal  and transvaginal, that revealed an enlarged uterus measuring 17.8 cm x  8.7 cm x 10.8 cm.  Multiple uterine fibroids were identified and  resulted in an inability to visualize the endometrial stripe.  The  largest visualized discrete fibroid measured up to 5.3 cm at the  posterior supracervical aspect.  Bilateral ovaries were also not  visualized.   PAST MEDICAL HISTORY:  She has no history of any chronic medical  conditions.   OBSTETRIC HISTORY:  She is a G3 P3-0-0-2.  She has had 3 C sections.   SURGICAL HISTORY:  The patient has had a bilateral tubal ligation for  contraception.   MEDICATIONS:  None.   ALLERGIES:  NO KNOWN DRUG ALLERGIES.   FAMILY HISTORY:  Positive for diabetes in her great grandmother and  cancer of unknown type in her aunt.   SOCIAL HISTORY:  She is currently working.  She is married and lives  with her husband.  She is a nonsmoker and nondrinker and nondrug abuser.   REVIEW OF SYSTEMS:  Positive for swelling in her legs, muscle aches,  fatigue, dizzy spells, coughing up phlegm, loss of urine when coughing,  sneezing, hot flashes, vaginal odor, itching, and pain with intercourse.   PHYSICAL EXAMINATION:  VITAL SIGNS:  Temperature is 97.3, pulse is 65,  blood pressure is 127/80, weight was 252.2 pounds or 114.41 kg, height  is 65 inches.  GENERAL:  Patient is obese, alert, and oriented, in no acute distress.  ABDOMEN:  Soft, diffusely tender to palpation.  Uterus palpated several  centimeters below the umbilicus.  No rebound or guarding.  GU:  The patient is normal on external genitalia without lesions.  The  patient had normal pink and moist vaginal mucosa.  A scant amount of  menstrual blood is present.  The patient had no cervical lesions.  Uterus is enlarged, as above.  Ovaries are not palpated.  A Pap smear  was performed.   ASSESSMENT:  This is a 50 year old female with menorrhagia, uterine  fibroids.   PLAN:  1.  Due to the patient's obesity, age, and ultrasound results, decision      was made to proceed with endometrial biopsy.  The risks and      benefits were discussed in detail with the patient, including risk      of infection, bleeding, and uterine perforation, and the patient      understands the risks and wishes to proceed.  Informed consent was      obtained and signed.  Cervix was cleaned with Betadine times two.      A tenaculum was placed and uterus was sounded to 13 cm.  Sounding      was initially difficult, as there was resistance at the internal      os.  After uterus was sounded, the pipette was inserted and an      adequate sample was collected to be sent to pathology.  The patient      tolerated the procedure well without any complications.  The      patient will return in two weeks for biopsy results.  2. For the treatment of her fibroids we discussed several options,      including Provera, Lupron, and surgical options.  The patient      wishes to proceed with a trial of Provera to see if she gets      adequate relief of her pain and menorrhagia with this.  She did      start her menstrual cycle today, so we will have her start taking      10 mg of Provera daily for the next 20 days and will re-assess her      symptoms at her two-week follow-up appointment, and at this time      decide how to proceed from there.  The patient is in agreement with      this plan.  3. The patient is also given a prescription for 800 mg of ibuprofen to      take after her biopsy and also for her severe menstrual cramps.     ______________________________  Dorene Ar, Dr.    ______________________________  Odie Sera, D.O.    JH/MEDQ  D:  11/23/2008  T:  11/23/2008  Job:  [id number]

## 2011-05-10 NOTE — Op Note (Signed)
[facility]  Patient:    Jenny [name], Jenny [name]                       MRN: [id number] Proc. Date: 05/14/01 Adm. Date:  [id number] Attending:  Lendon Colonel                           Operative Report  PREOPERATIVE DIAGNOSES:  Severe dysmenorrhea and menorrhagia.  POSTOPERATIVE DIAGNOSES:  Severe dysmenorrhea and menorrhagia.  OPERATION:  Laparoscopy with lysis of adhesions with the YAG laser.  DESCRIPTION OF PROCEDURE:  The patient was placed in the supine position and prepped and draped in the usual fashion.  A transverse incision was made in the umbilicus, and the abdomen was distended with the Veress needle.  A trocar was inserted into the abdomen and visualization of the pelvis revealed dense adhesions to the anterior abdominal wall.  Using the YAG laser, I was able to free about two-thirds of the adhesions enough so that we could get a good look at the uterus.  The uterus had a fundal fibroid.  The ovary had a follicular cyst on its right surface.  There was no evidence of endometriosis.  After manipulating the adhesions, I placed first a 5 mm trocar and then a second trocar in the right lower quadrant and was able to release a good three-fifths to four-fifths of the adhesions.  We deemed that a LUNA procedure was not necessary, and the gas was evacuated.  The two incisions were closed with 0 Vicryl and then 3-0 Vicryl for the skin.  Went down below and carefully dilated the cervix.  The endometrial cavity had several small polyps which were resected and a small posterior fundal fibroid.  The cervix was injected with a Pitressin solution.  Ms. Lortz tolerated this procedure well and was sent to the recovery room in good condition.  Final diagnosis is uterine fibroids and dense pelvic adhesions. DD:  05/14/01 TD:  05/14/01 Job: [id number]

## 2011-07-09 ENCOUNTER — Telehealth: Payer: Self-pay | Admitting: Family Medicine

## 2011-07-09 NOTE — Telephone Encounter (Signed)
The paperwork to be completed for the Physicals for the family was faxed yesterday.  Jenny [name] will be coming in at 11:30 this morning for a Physical with Dr. Deirdre Priest and was hoping it might be able to be completed.  I told him that I couldn't promise it would be.

## 2011-07-09 NOTE — Telephone Encounter (Signed)
To MD to see if he has received forms.  Actually Jenny [name] has appt with MD tomorrow.

## 2011-09-27 LAB — POCT PREGNANCY, URINE: Preg Test, Ur: NEGATIVE

## 2012-05-05 ENCOUNTER — Encounter: Payer: Self-pay | Admitting: Family Medicine

## 2012-05-05 ENCOUNTER — Ambulatory Visit (INDEPENDENT_AMBULATORY_CARE_PROVIDER_SITE_OTHER): Payer: Self-pay | Admitting: Family Medicine

## 2012-05-05 VITALS — BP 118/76 | HR 76 | Temp 98.0°F | Ht 66.0 in | Wt 262.0 lb

## 2012-05-05 DIAGNOSIS — M5412 Radiculopathy, cervical region: Secondary | ICD-10-CM

## 2012-05-05 DIAGNOSIS — M542 Cervicalgia: Secondary | ICD-10-CM | POA: Insufficient documentation

## 2012-05-05 MED ORDER — CAPSAICIN-MENTHOL-METHYL SAL 0.05-7-20 % EX OINT: 1.0000 "application " | TOPICAL_OINTMENT | Freq: Three times a day (TID) | CUTANEOUS | Status: DC | PRN

## 2012-05-05 MED ORDER — GABAPENTIN 300 MG PO CAPS: 300.0000 mg | ORAL_CAPSULE | Freq: Three times a day (TID) | ORAL | Status: DC

## 2012-05-05 NOTE — Progress Notes (Signed)
  Subjective:   Patient ID: Jenny [name], female DOB: July 07, 1961 51 y.o. MRN: [id number] HPI:  1. Pain in right arm/shoulder. Patient describes the pain as shooting that goes from her neck down her arm into her hand. She has numbness and tingling in her hand.  Onset: has been chronic and recurrent  Time period of: 1 year(s).  Severity is described as moderate.  Course of her symptoms over time is recurrent, intermittent and worsening. Aggravating: certain head movements cause a "locking" that makes her not move her arm.  Alleviating: putting her arm in a sling/covering her arm with a blanket/jacket for warmth.  Associated sx/sn: coldness of right arm.  No injuries that could have caused this. History of spider bite on the forearm years ago.  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  Denies chest pain, jaw pain, sob.  History  Substance Use Topics  . Smoking status: Never Smoker   . Smokeless tobacco: Not on file  . Alcohol Use: Not on file   Review of Systems: Pertinent items are noted in HPI.    Objective:   Filed Vitals:   05/05/12 1613  BP: 118/76  Pulse: 76  Temp: 98 F (36.7 C)  TempSrc: Oral  Height: 5\' 6"  (1.676 m)  Weight: 262 lb (118.842 kg)   Physical Exam: General: aaf, nad, morbidly obese. Right arm: no ttp from hand to shoulder. Positive spurling's test. Negative tinnels. Reflexes normal. Strength equal bilaterally. Sensation intact.  Negative empty can. FAROM. Behind body, up in the air, crossed arms.   Assessment & Plan:

## 2012-05-05 NOTE — Assessment & Plan Note (Signed)
Radicular symptoms on the right worse with spurlings. Will obtain an MRI to evaluate this extent of disease and need for further interventions including surgery as a possibility.  Capsaicin ointment. neurontin 300 MG TID.

## 2012-05-05 NOTE — Progress Notes (Signed)
appt made for pt to have MRI w/o contrast of cervical spine.  [address]  May 10, 2012 @630  pm pt given this information prior to leaving ofc.Loralee Pacas Edmonson

## 2012-05-05 NOTE — Patient Instructions (Signed)
Meds ordered this encounter  Medications  . gabapentin (NEURONTIN) 300 MG capsule    Sig: Take 1 capsule (300 mg total) by mouth 3 (three) times daily.    Dispense:  90 capsule    Refill:  3  . Capsaicin-Menthol-Methyl Sal 0.04-29-19 % OINT    Sig: Apply 1 application topically 3 (three) times daily as needed.    Dispense:  120 g    Refill:  1   I have ordered an MRI of your neck bones.  I think the cause of your nerve pain on the right side is pressing of your nerves from your spine.

## 2012-05-10 ENCOUNTER — Ambulatory Visit
Admission: RE | Admit: 2012-05-10 | Discharge: 2012-05-10 | Disposition: A | Discharge status: Home / Self Care | Payer: BC Managed Care – PPO | Source: Ambulatory Visit | Attending: Family Medicine | Admitting: Family Medicine

## 2012-05-10 DIAGNOSIS — M5412 Radiculopathy, cervical region: Secondary | ICD-10-CM

## 2012-05-11 ENCOUNTER — Telehealth: Payer: Self-pay | Admitting: Family Medicine

## 2012-05-11 DIAGNOSIS — M4802 Spinal stenosis, cervical region: Secondary | ICD-10-CM

## 2012-05-11 NOTE — Telephone Encounter (Signed)
Reviewed mri results. Notified patient. referral to neurosurgery.

## 2012-05-14 ENCOUNTER — Telehealth: Payer: Self-pay | Admitting: Family Medicine

## 2012-05-14 NOTE — Telephone Encounter (Signed)
Pt hasn't heard anything about results about her MRI-   I read to him about what Dr Rivka Safer put in on 5/20 and they didn't know anything about this.

## 2012-05-15 NOTE — Telephone Encounter (Signed)
Left another message on their phone. If they would like to discuss the results of the MRI they need to call back and leave a number and good time to call. I have already placed a referral to neurosurgery on the 20th.

## 2012-05-15 NOTE — Telephone Encounter (Signed)
I left a message on her phone

## 2012-06-17 HISTORY — PX: CERVICAL SPINE SURGERY: SHX589

## 2012-06-22 ENCOUNTER — Inpatient Hospital Stay (HOSPITAL_COMMUNITY)
Admission: AD | Admit: 2012-06-22 | Discharge: 2012-06-24 | DRG: 183 | Disposition: A | Discharge status: Home / Self Care | Payer: BC Managed Care – PPO | Source: Ambulatory Visit | Attending: Neurosurgery | Admitting: Neurosurgery

## 2012-06-22 DIAGNOSIS — Z981 Arthrodesis status: Secondary | ICD-10-CM

## 2012-06-22 DIAGNOSIS — R1310 Dysphagia, unspecified: Principal | ICD-10-CM | POA: Diagnosis present

## 2012-06-22 LAB — BASIC METABOLIC PANEL
BUN: 14 mg/dL (ref 6–23)
CO2: 19 mEq/L (ref 19–32)
Calcium: 10 mg/dL (ref 8.4–10.5)
Chloride: 103 mEq/L (ref 96–112)
Creatinine, Ser: 0.66 mg/dL (ref 0.50–1.10)
GFR calc Af Amer: >90 mL/min (ref >90)
GFR calc non Af Amer: >90 mL/min (ref >90)
Glucose, Bld: 104 mg/dL — ABNORMAL HIGH (ref 70–99)
Potassium: 3.8 mEq/L (ref 3.5–5.1)
Sodium: 141 mEq/L (ref 135–145)

## 2012-06-22 LAB — CBC
HCT: 40.4 % (ref 36.0–46.0)
Hemoglobin: 13.8 g/dL (ref 12.0–15.0)
MCH: 27.9 pg (ref 26.0–34.0)
MCHC: 34.2 g/dL (ref 30.0–36.0)
MCV: 81.6 fL (ref 78.0–100.0)
Platelets: 381 10*3/uL (ref 150–400)
RBC: 4.95 MIL/uL (ref 3.87–5.11)
RDW: 14.8 % (ref 11.5–15.5)
WBC: 8.6 10*3/uL (ref 4.0–10.5)

## 2012-06-22 LAB — GLUCOSE, CAPILLARY: Glucose-Capillary: 139 mg/dL — ABNORMAL HIGH (ref 70–99)

## 2012-06-22 MED ORDER — KCL IN DEXTROSE-NACL 20-5-0.45 MEQ/L-%-% IV SOLN
INTRAVENOUS | Status: DC
  Administered 2012-06-22 – 2012-06-24 (×4): via INTRAVENOUS
  Filled 2012-06-22 (×5): qty 1000

## 2012-06-22 MED ORDER — DEXAMETHASONE SODIUM PHOSPHATE 10 MG/ML IJ SOLN
10.0000 mg | Freq: Once | INTRAMUSCULAR | Status: AC
  Administered 2012-06-22: 10 mg via INTRAVENOUS
  Filled 2012-06-22: qty 1

## 2012-06-22 MED ORDER — DEXAMETHASONE SODIUM PHOSPHATE 4 MG/ML IJ SOLN
8.0000 mg | INTRAMUSCULAR | Status: DC
  Administered 2012-06-22 – 2012-06-23 (×5): 8 mg via INTRAVENOUS
  Filled 2012-06-22 (×11): qty 2

## 2012-06-22 NOTE — Progress Notes (Signed)
Patient got on the floor with family members she , vitals ok and MD notified

## 2012-06-22 NOTE — H&P (Signed)
  Jenny [name] is an 51 y.o. female.   Chief Complaint: Swallowing difficulty HPI: The patient is a 51 yo female who underwent a two level ACDF 5 days ago. Since surgery, she has done well except for progressive difficulty swallowing.  Her husband was concerned about her hydration, and nutrition, and called the office. It was elected to admit the patient for observation and IV steroids.  No past medical history on file.  No past surgical history on file.  No family history on file. Social History:  reports that she has never smoked. She does not have any smokeless tobacco history on file. Her alcohol and drug histories not on file.  Allergies: No Known Allergies  Medications Prior to Admission  Medication Sig Dispense Refill  . HYDROcodone-acetaminophen (VICODIN) 5-500 MG per tablet Take 1 tablet by mouth every 6 (six) hours as needed. For pain.        Results for orders placed during the hospital encounter of 06/22/12 (from the past 48 hour(s))  CBC     Status: Normal   Collection Time   06/22/12  1:35 PM      Component Value Range Comment   WBC 8.6  4.0 - 10.5 K/uL WHITE COUNT CONFIRMED ON SMEAR   RBC 4.95  3.87 - 5.11 MIL/uL    Hemoglobin 13.8  12.0 - 15.0 g/dL    HCT 40.9  81.1 - 91.4 %    MCV 81.6  78.0 - 100.0 fL    MCH 27.9  26.0 - 34.0 pg    MCHC 34.2  30.0 - 36.0 g/dL    RDW 78.2  95.6 - 21.3 %    Platelets 381  150 - 400 K/uL   BASIC METABOLIC PANEL     Status: Abnormal   Collection Time   06/22/12  1:35 PM      Component Value Range Comment   Sodium 141  135 - 145 mEq/L    Potassium 3.8  3.5 - 5.1 mEq/L    Chloride 103  96 - 112 mEq/L    CO2 19  19 - 32 mEq/L    Glucose, Bld 104 (*) 70 - 99 mg/dL    BUN 14  6 - 23 mg/dL    Creatinine, Ser 0.86  0.50 - 1.10 mg/dL    Calcium 57.8  8.4 - 10.5 mg/dL    GFR calc non Af Amer >90  >90 mL/min    GFR calc Af Amer >90  >90 mL/min    No results found.  A comprehensive review of systems was negative.  Blood pressure  152/91, pulse 97, temperature 98.4 F (36.9 C), temperature source Oral, resp. rate 18, height 5\' 5"  (1.651 m), weight 114.76 kg (253 lb), SpO2 97.00%.  The patient is awake, alert and oriented. her strength is 5/5. Her breathing is unlabored, and her speech is clear. She has no swelling, fullness or redness of her incision. Assessment/Plan Impression is of post op dysphagia. The plan is for 24 hours of steroids to look for improvement. If not, will get CT of neck even though wound looks excellent.  Reinaldo Meeker, MD 06/22/2012, 4:37 PM

## 2012-06-23 ENCOUNTER — Encounter (HOSPITAL_COMMUNITY): Payer: Self-pay

## 2012-06-23 MED ORDER — DEXAMETHASONE SODIUM PHOSPHATE 4 MG/ML IJ SOLN
4.0000 mg | Freq: Four times a day (QID) | INTRAMUSCULAR | Status: DC
  Administered 2012-06-23 – 2012-06-24 (×4): 4 mg via INTRAVENOUS
  Filled 2012-06-23 (×4): qty 1

## 2012-06-23 NOTE — Progress Notes (Signed)
Patient ID: Jenny [name], female   DOB: 12/21/1961, 51 y.o.   MRN: [id number] Subjective: Patient reports feeling much better  Objective: Vital signs in last 24 hours: Temp:  [98 F (36.7 C)-98.6 F (37 C)] 98 F (36.7 C) (07/02 1000) Pulse Rate:  [75-97] 75  (07/02 1000) Resp:  [17-18] 18  (07/02 1000) BP: (116-152)/(75-91) 125/85 mmHg (07/02 1000) SpO2:  [94 %-97 %] 94 % (07/02 1000) Weight:  [114.76 kg (253 lb)] 114.76 kg (253 lb) (07/01 1200)  Intake/Output from previous day: 07/01 0701 - 07/02 0700 In: 825 [I.V.:825] Out: -  Intake/Output this shift:    No issue with swallowing today  Lab Results:  [facility] 06/22/12 1335  WBC 8.6  HGB 13.8  HCT 40.4  PLT 381   BMET  Basename 06/22/12 1335  NA 141  K 3.8  CL 103  CO2 19  GLUCOSE 104*  BUN 14  CREATININE 0.66  CALCIUM 10.0    Studies/Results: No results found.  Assessment/Plan: Doing much better. Ate ice cream without difficulty. Will advance diet slowly. Probably d/c tomorrow.  LOS: 1 day  as above   Reinaldo Meeker, MD 06/23/2012, 10:54 AM

## 2012-06-24 MED ORDER — METHYLPREDNISOLONE 4 MG PO KIT: PACK | ORAL | Status: AC

## 2012-06-24 NOTE — Progress Notes (Signed)
Patient ambulated in hall.  After ambulationPt complained of dizziness and fatigue.  Dizziness relieved when at rest. BP 123/77  HR 78.  Pt concerned and would like MD to be made aware.

## 2012-06-24 NOTE — Discharge Summary (Signed)
  Physician Discharge Summary  Patient ID: Jenny [name] MRN: [id number] DOB/AGE: 49-Nov-1962 49 y.o.  Admit date: 06/22/2012 Discharge date: 06/24/2012  Admission Diagnoses:  Discharge Diagnoses:  Active Problems:  * No active hospital problems. *    Discharged Condition: good  Hospital Course: Admitted for post op dysphagia. Started on prednisone, and by next day markedly improved. Diet advanced, and ready for d/c on 2nd day post admission. Specific inst given.  Consults: None  Significant Diagnostic Studies: none  Treatments: IV hydration  Discharge Exam: Blood pressure 123/77, pulse 78, temperature 97.6 F (36.4 C), temperature source Oral, resp. rate 18, height 5\' 5"  (1.651 m), weight 114.76 kg (253 lb), SpO2 98.00%. Incision/Wound:Healing well  Disposition:   Discharge Orders    Future Orders Please Complete By Expires   Diet general      Discharge instructions      Comments:   Mostly bedrest. Get up 9 or 10 times each day and walk for 15-20 minutes each time. Very little sitting the first week. No riding in the car until your first post op appointment. If you had neck surgery...may shower from the chest down. If you had low back surgery....you may shower with a saran wrap covering over the incision. Take your pain medicine as needed...and other medicines that you are instructed to take. Call for an appointment...[phone].   Call MD for:  temperature >100.4      Call MD for:  persistant nausea and vomiting      Call MD for:  severe uncontrolled pain      Call MD for:  redness, tenderness, or signs of infection (pain, swelling, redness, odor or green/yellow discharge around incision site)      Call MD for:  difficulty breathing, headache or visual disturbances      Call MD for:  hives        Medication List  As of 06/24/2012  8:30 AM   TAKE these medications         HYDROcodone-acetaminophen 5-500 MG per tablet   Commonly known as: VICODIN   Take 1 tablet by mouth  every 6 (six) hours as needed. For pain.      methylPREDNISolone 4 MG tablet   Commonly known as: MEDROL DOSEPAK   follow package directions             At home rest most of the time. Get up 9 or 10 times each day and take a 15 or 20 minute walk. No riding in the car and to your first postoperative appointment. If you have neck surgery you may shower from the chest down starting on the third postoperative day. If you had back surgery he may start showering on the third postoperative day with saran wrap wrapped around your incisional area 3 times. After the shower remove the saran wrap. Take pain medicine as needed and other medications as instructed. Call my office for an appointment.  SignedReinaldo Meeker, MD 06/24/2012, 8:30 AM

## 2012-06-24 NOTE — Care Management Note (Signed)
    Page 1 of 1   06/24/2012     11:21:06 AM   CARE MANAGEMENT NOTE 06/24/2012  Patient:  Jenny [name], Jenny [name]   Account Number:  [id number]  Date Initiated:  06/23/2012  Documentation initiated by:  [facility]  Subjective/Objective Assessment:   Admitted with dysphagia.  Lives with spouse.     Action/Plan:   Anticipated DC Date:  06/24/2012   Anticipated DC Plan:  HOME/SELF CARE      DC Planning Services  CM consult      Choice offered to / List presented to:             Status of service:  Completed, signed off Medicare Important Message given?   (If response is "NO", the following Medicare IM given date fields will be blank) Date Medicare IM given:   Date Additional Medicare IM given:    Discharge Disposition:  HOME/SELF CARE  Per UR Regulation:  Reviewed for med. necessity/level of care/duration of stay  If discussed at Long Length of Stay Meetings, dates discussed:    Comments:  PCP Dr. Pearlean Brownie

## 2013-05-24 ENCOUNTER — Ambulatory Visit (INDEPENDENT_AMBULATORY_CARE_PROVIDER_SITE_OTHER): Payer: BC Managed Care – PPO | Admitting: Family Medicine

## 2013-05-24 ENCOUNTER — Encounter: Payer: Self-pay | Admitting: Family Medicine

## 2013-05-24 VITALS — BP 120/84 | HR 80 | Ht 65.0 in | Wt 258.0 lb

## 2013-05-24 DIAGNOSIS — Z634 Disappearance and death of family member: Secondary | ICD-10-CM

## 2013-05-24 MED ORDER — LORAZEPAM 0.5 MG PO TABS: 1.0000 mg | ORAL_TABLET | Freq: Three times a day (TID) | ORAL | Status: DC | PRN

## 2013-05-24 NOTE — Patient Instructions (Addendum)
It was nice meeting you today.  I'm so sorry for your loss.    We will try Ativan for anxiety.  This is only for a short duration as it can be habit forming and I do not prescribed this on a chronic basis.  If your symptoms worsen or do not improve please let me know.  Please follow up in 1 month so we can see how you are doing.

## 2013-05-24 NOTE — Progress Notes (Signed)
Subjective:     Patient ID: Jenny [name], female   DOB: 1961-04-23, 52 y.o.   MRN: [id number]  HPI Jenny [name] presents today for anxiety/depression following recent loss. - Patient reports recent loss; Brother and his spouse passed away unexpectedly 2 weeks ago. - She reports significant anxiety and inability to meet deadlines and deal with the pressures and expectations at work.   - Due to her anxiety and difficulty concentrating (and lack of motivation) she has requested time away from work.    - She also endorses feeling "down" but states that she has a good support system and is doing okay.   Review of Systems Patient also notes increasing sleep and difficulty getting out of bed in the am.    Objective:   Physical Exam Filed Vitals:   05/24/13 1513  BP: 120/84  Pulse: 80  General: sitting comfortably on table, NAD.  Does appear distracted and depressed. Psych: appears distracted with little direct eye contact.  Tearful during conversation.  Thought process appropriate. Mood - depressed.     Assessment:     See Prob. List    Plan:

## 2013-05-24 NOTE — Assessment & Plan Note (Signed)
Patient appears to be undergoing normal grief over the loss of loved ones (brother and sister-in-law). However, patient is having significant anxiety.  Will give short-term Ativan PRN for anxiety.  Informed patient of short-term nature and patient understands and agrees.  Appointment with Dr. Pascal Lux offered by patient declined.  Patient to follow up in ~ 1 month.

## 2013-05-27 ENCOUNTER — Telehealth: Payer: Self-pay | Admitting: Family Medicine

## 2013-05-27 NOTE — Telephone Encounter (Signed)
FMLA form dropped off.  Please call when complted.

## 2013-05-27 NOTE — Telephone Encounter (Signed)
Please send for to Dr Adriana Simas since he evaluated her Thanks [facility]

## 2013-05-31 ENCOUNTER — Telehealth: Payer: Self-pay | Admitting: *Deleted

## 2013-05-31 NOTE — Telephone Encounter (Signed)
I'm on OB and haven't had time to complete it.  You can send it to her PCP (Dr. Deirdre Priest) if you like.

## 2013-05-31 NOTE — Telephone Encounter (Signed)
Patient stopped by and was asking about FMLA paperwork that shew dropped off.

## 2013-06-04 ENCOUNTER — Telehealth: Payer: Self-pay | Admitting: *Deleted

## 2013-06-04 NOTE — Telephone Encounter (Signed)
Message copied by Farrell Ours on Fri Jun 04, 2013 12:09 PM ------      Message from: Tommie Sams      Created: Fri Jun 04, 2013 11:52 AM      Regarding: FMLA form       I completed designated parts of form.  Please inform patient that it is available for pick up at the front desk.            Thanks            [employer] DO ------

## 2013-06-04 NOTE — Telephone Encounter (Signed)
Spoke with patient and informed her that FMLA papers up front for pick up

## 2013-07-21 ENCOUNTER — Ambulatory Visit (INDEPENDENT_AMBULATORY_CARE_PROVIDER_SITE_OTHER): Payer: BC Managed Care – PPO | Admitting: Family Medicine

## 2013-07-21 ENCOUNTER — Encounter: Payer: Self-pay | Admitting: Family Medicine

## 2013-07-21 VITALS — BP 132/83 | HR 72 | Temp 98.2°F | Wt 263.0 lb

## 2013-07-21 DIAGNOSIS — Z634 Disappearance and death of family member: Secondary | ICD-10-CM

## 2013-07-21 NOTE — Assessment & Plan Note (Signed)
Improved.   She has good insight and plans if she feels worse.  Fine to go back to work.

## 2013-07-21 NOTE — Patient Instructions (Addendum)
  Come back as needed or for check in a year or so  Schedule a mammogram and a colonoscopy  If you are worsening anxiety then come back sooner  You weight is your most important health issue - aim is to lose 1-2 lb a week

## 2013-07-21 NOTE — Progress Notes (Signed)
  Subjective:    Patient ID: Jenny [name], female    DOB: 03-12-1961, 52 y.o.   MRN: [id number]  HPI  Anxiety - post stress Feeling much better.  Has come to terms with the deaths, is sleeping back to normal, not taking any medications now.  Eating well.  Feels she is ready to go back to work.  Can discuss with her church members and her husband.  No indications of suicidal ideation    Review of Systems     Objective:   Physical Exam  Alert interactive Psych:  Cognition and judgment appear intact. Alert, communicative  and cooperative with normal attention span and concentration. No apparent delusions, illusions, hallucinations       Assessment & Plan:

## 2013-11-23 ENCOUNTER — Encounter (HOSPITAL_BASED_OUTPATIENT_CLINIC_OR_DEPARTMENT_OTHER): Payer: Self-pay | Admitting: Emergency Medicine

## 2013-11-23 ENCOUNTER — Emergency Department (HOSPITAL_BASED_OUTPATIENT_CLINIC_OR_DEPARTMENT_OTHER)
Admission: EM | Admit: 2013-11-23 | Discharge: 2013-11-23 | Disposition: A | Discharge status: Home / Self Care | Payer: BC Managed Care – PPO | Attending: Emergency Medicine | Admitting: Emergency Medicine

## 2013-11-23 DIAGNOSIS — Z8639 Personal history of other endocrine, nutritional and metabolic disease: Secondary | ICD-10-CM | POA: Insufficient documentation

## 2013-11-23 DIAGNOSIS — E669 Obesity, unspecified: Secondary | ICD-10-CM | POA: Insufficient documentation

## 2013-11-23 DIAGNOSIS — R209 Unspecified disturbances of skin sensation: Secondary | ICD-10-CM | POA: Insufficient documentation

## 2013-11-23 DIAGNOSIS — IMO0001 Reserved for inherently not codable concepts without codable children: Secondary | ICD-10-CM | POA: Insufficient documentation

## 2013-11-23 DIAGNOSIS — M792 Neuralgia and neuritis, unspecified: Secondary | ICD-10-CM

## 2013-11-23 DIAGNOSIS — Z862 Personal history of diseases of the blood and blood-forming organs and certain disorders involving the immune mechanism: Secondary | ICD-10-CM | POA: Insufficient documentation

## 2013-11-23 LAB — GLUCOSE, CAPILLARY: Glucose-Capillary: 102 mg/dL — ABNORMAL HIGH (ref 70–99)

## 2013-11-23 MED ORDER — GABAPENTIN 100 MG PO CAPS: 100.0000 mg | ORAL_CAPSULE | Freq: Three times a day (TID) | ORAL | Status: DC

## 2013-11-23 MED ORDER — GABAPENTIN 100 MG PO CAPS
100.0000 mg | ORAL_CAPSULE | Freq: Once | ORAL | Status: DC
  Filled 2013-11-23: qty 1

## 2013-11-23 MED ORDER — HYDROCODONE-ACETAMINOPHEN 5-325 MG PO TABS: 1.0000 | ORAL_TABLET | Freq: Four times a day (QID) | ORAL | Status: DC | PRN

## 2013-11-23 MED ORDER — HYDROCODONE-ACETAMINOPHEN 5-325 MG PO TABS
1.0000 | ORAL_TABLET | Freq: Once | ORAL | Status: AC
  Administered 2013-11-23: 1 via ORAL
  Filled 2013-11-23: qty 1

## 2013-11-23 NOTE — ED Provider Notes (Signed)
CSN: [id number]     Arrival date & time 11/23/13  1418 History   First MD Initiated Contact with Patient 11/23/13 1527     Chief Complaint  Patient presents with  . Leg Pain   (Consider location/radiation/quality/duration/timing/severity/associated sxs/prior Treatment) HPI  This is a 52 year old female with no reported past medical history who presents with bilateral foot and ankle pain for the last 4 weeks. Patient reports progressive burning pain of the bilateral feet. She states that she takes Aleve at home but does help some. She is noted swelling of the bilateral feet especially at the end of the day. Over last 2 days, the pain has made it difficult to ambulate. She states that she is never been diagnosed with diabetes but has been called "borderline diabetic." Patient does also report some tingling in her bilateral hands. She denies any chest pain, shortness of breath, fevers.  History reviewed. No pertinent past medical history. Past Surgical History  Procedure Laterality Date  . Tonsillectomy    . Cervical spine surgery  06/17/2012    C5-C7 ACDF  . Cesarean section    . Partial hysterectomy     No family history on file. History  Substance Use Topics  . Smoking status: Never Smoker   . Smokeless tobacco: Never Used  . Alcohol Use: No   OB History   Grav Para Term Preterm Abortions TAB SAB Ect Mult Living                 Review of Systems  Constitutional: Negative for fever.  Respiratory: Negative for chest tightness and shortness of breath.   Cardiovascular: Negative for chest pain.  Gastrointestinal: Negative for nausea, vomiting and abdominal pain.  Musculoskeletal: Positive for joint swelling. Negative for back pain.       Bilateral foot and ankle pain  All other systems reviewed and are negative.    Allergies  Review of patient's allergies indicates no known allergies.  Home Medications   Current Outpatient Rx  Name  Route  Sig  Dispense  Refill  .  gabapentin (NEURONTIN) 100 MG capsule   Oral   Take 1 capsule (100 mg total) by mouth 3 (three) times daily.   90 capsule   0   . HYDROcodone-acetaminophen (NORCO/VICODIN) 5-325 MG per tablet   Oral   Take 1 tablet by mouth every 6 (six) hours as needed.   10 tablet   0    BP 107/71  Pulse 73  Temp(Src) 98.6 F (37 C) (Oral)  Resp 18  Ht 5\' 2"  (1.575 m)  Wt 260 lb (117.935 kg)  BMI 47.54 kg/m2  SpO2 99% Physical Exam  Nursing note and vitals reviewed. Constitutional: She is oriented to person, place, and time. No distress.  Obese  HENT:  Head: Normocephalic and atraumatic.  Cardiovascular: Normal rate, regular rhythm and normal heart sounds.   Pulmonary/Chest: Effort normal and breath sounds normal. No respiratory distress.  Abdominal: Soft. There is no tenderness.  Musculoskeletal:  Examination of the bilateral feet reveals no significant swelling of the foot and ankle, there is no warmth or erythema, there is no tenderness to palpation or midfoot tenderness. There is no obvious deformity the  Neurological: She is alert and oriented to person, place, and time.  Skin: Skin is warm and dry.  Psychiatric: She has a normal mood and affect.    ED Course  Procedures (including critical care time) Labs Review Labs Reviewed  GLUCOSE, CAPILLARY - Abnormal; Notable for the  following:    Glucose-Capillary 102 (*)    All other components within normal limits   Imaging Review No results found.  EKG Interpretation   None      Medications  HYDROcodone-acetaminophen (NORCO/VICODIN) 5-325 MG per tablet 1 tablet (1 tablet Oral Given 11/23/13 1558)   MDM   1. Neuropathic pain    Patient presents with a four-week history of progressive bilateral foot pain. She has no evidence of injury, warmth, erythema or infection of the feet. She has no significant swelling. Given the description of pain and the duration as well as reports of tingling in her bilateral hands, I suspect the  patient may have some neuropathy. She has no documented history of diabetes but has had previous random blood sugars greater than 126.  Blood glucose here is 102. Patient was given Norco. She will be given a prescription for Norco and gabapentin. She was encouraged followup with her primary care doctor for further evaluation for diabetes and titration of medication.  After history, exam, and medical workup I feel the patient has been appropriately medically screened and is safe for discharge home. Pertinent diagnoses were discussed with the patient. Patient was given return precautions.     Shon Baton, MD 11/23/13 [id number]

## 2013-11-23 NOTE — ED Notes (Signed)
C/o pain to bilat legs, feet with swelling to feet and ankles

## 2014-02-03 ENCOUNTER — Ambulatory Visit (INDEPENDENT_AMBULATORY_CARE_PROVIDER_SITE_OTHER): Payer: BC Managed Care – PPO | Admitting: Family Medicine

## 2014-02-03 ENCOUNTER — Encounter: Payer: Self-pay | Admitting: Family Medicine

## 2014-02-03 VITALS — BP 152/90 | HR 75 | Temp 98.5°F | Wt 264.0 lb

## 2014-02-03 DIAGNOSIS — A084 Viral intestinal infection, unspecified: Secondary | ICD-10-CM

## 2014-02-03 DIAGNOSIS — A088 Other specified intestinal infections: Secondary | ICD-10-CM

## 2014-02-03 DIAGNOSIS — R109 Unspecified abdominal pain: Secondary | ICD-10-CM

## 2014-02-03 LAB — POCT URINALYSIS DIPSTICK
Bilirubin, UA: NEGATIVE
Glucose, UA: NEGATIVE
Ketones, UA: NEGATIVE
Leukocytes, UA: NEGATIVE
Nitrite, UA: NEGATIVE
Protein, UA: NEGATIVE
Spec Grav, UA: 1.02
Urobilinogen, UA: 0.2
pH, UA: 8

## 2014-02-03 LAB — POCT UA - MICROSCOPIC ONLY

## 2014-02-03 MED ORDER — LOPERAMIDE HCL 2 MG PO CAPS: 2.0000 mg | ORAL_CAPSULE | ORAL | Status: DC | PRN

## 2014-02-03 NOTE — Patient Instructions (Signed)
Diet for Diarrhea, Adult  Frequent, runny stools (diarrhea) may be caused or worsened by food or drink. Diarrhea may be relieved by changing your diet. Since diarrhea can last up to 7 days, it is easy for you to lose too much fluid from the body and become dehydrated. Fluids that are lost need to be replaced. Along with a modified diet, make sure you drink enough fluids to keep your urine clear or pale yellow.  DIET INSTRUCTIONS  · Ensure adequate fluid intake (hydration): have 1 cup (8 oz) of fluid for each diarrhea episode. Avoid fluids that contain simple sugars or sports drinks, fruit juices, whole milk products, and sodas. Your urine should be clear or pale yellow if you are drinking enough fluids. Hydrate with an oral rehydration solution that you can purchase at pharmacies, retail stores, and online. You can prepare an oral rehydration solution at home by mixing the following ingredients together:  ·   tsp table salt.  · ¾ tsp baking soda.  ·  tsp salt substitute containing potassium chloride.  · 1  tablespoons sugar.  · 1 L (34 oz) of water.  · Certain foods and beverages may increase the speed at which food moves through the gastrointestinal (GI) tract. These foods and beverages should be avoided and include:  · Caffeinated and alcoholic beverages.  · High-fiber foods, such as raw fruits and vegetables, nuts, seeds, and whole grain breads and cereals.  · Foods and beverages sweetened with sugar alcohols, such as xylitol, sorbitol, and mannitol.  · Some foods may be well tolerated and may help thicken stool including:  · Starchy foods, such as rice, toast, pasta, low-sugar cereal, oatmeal, grits, baked potatoes, crackers, and bagels.    · Bananas.    · Applesauce.  · Add probiotic-rich foods to help increase healthy bacteria in the GI tract, such as yogurt and fermented milk products.  RECOMMENDED FOODS AND BEVERAGES  Starches  Choose foods with less than 2 g of fiber per serving.  · Recommended:  White,  French, and pita breads, plain rolls, buns, bagels. Plain muffins, matzo. Soda, saltine, or graham crackers. Pretzels, melba toast, zwieback. Cooked cereals made with water: cornmeal, farina, cream cereals. Dry cereals: refined corn, wheat, rice. Potatoes prepared any way without skins, refined macaroni, spaghetti, noodles, refined rice.  · Avoid:  Bread, rolls, or crackers made with whole wheat, multi-grains, rye, bran seeds, nuts, or coconut. Corn tortillas or taco shells. Cereals containing whole grains, multi-grains, bran, coconut, nuts, raisins. Cooked or dry oatmeal. Coarse wheat cereals, granola. Cereals advertised as "high-fiber." Potato skins. Whole grain pasta, wild or brown rice. Popcorn. Sweet potatoes, yams. Sweet rolls, doughnuts, waffles, pancakes, sweet breads.  Vegetables  · Recommended: Strained tomato and vegetable juices. Most well-cooked and canned vegetables without seeds. Fresh: Tender lettuce, cucumber without the skin, cabbage, spinach, bean sprouts.  · Avoid: Fresh, cooked, or canned: Artichokes, baked beans, beet greens, broccoli, Brussels sprouts, corn, kale, legumes, peas, sweet potatoes. Cooked: Green or red cabbage, spinach. Avoid large servings of any vegetables because vegetables shrink when cooked, and they contain more fiber per serving than fresh vegetables.  Fruit  · Recommended: Cooked or canned: Apricots, applesauce, cantaloupe, cherries, fruit cocktail, grapefruit, grapes, kiwi, mandarin oranges, peaches, pears, plums, watermelon. Fresh: Apples without skin, ripe banana, grapes, cantaloupe, cherries, grapefruit, peaches, oranges, plums. Keep servings limited to ½ cup or 1 piece.  · Avoid: Fresh: Apples with skin, apricots, mangoes, pears, raspberries, strawberries. Prune juice, stewed or dried prunes. Dried   fruits, raisins, dates. Large servings of all fresh fruits.  Protein  · Recommended: Ground or well-cooked tender beef, ham, veal, lamb, pork, or poultry. Eggs. Fish,  oysters, shrimp, lobster, other seafoods. Liver, organ meats.  · Avoid: Tough, fibrous meats with gristle. Peanut butter, smooth or chunky. Cheese, nuts, seeds, legumes, dried peas, beans, lentils.  Dairy  · Recommended: Yogurt, lactose-free milk, kefir, drinkable yogurt, buttermilk, soy milk, or plain hard cheese.  · Avoid: Milk, chocolate milk, beverages made with milk, such as milkshakes.  Soups  · Recommended: Bouillon, broth, or soups made from allowed foods. Any strained soup.  · Avoid: Soups made from vegetables that are not allowed, cream or milk-based soups.  Desserts and Sweets  · Recommended: Sugar-free gelatin, sugar-free frozen ice pops made without sugar alcohol.  · Avoid: Plain cakes and cookies, pie made with fruit, pudding, custard, cream pie. Gelatin, fruit, ice, sherbet, frozen ice pops. Ice cream, ice milk without nuts. Plain hard candy, honey, jelly, molasses, syrup, sugar, chocolate syrup, gumdrops, marshmallows.  Fats and Oils  · Recommended: Limit fats to less than 8 tsp per day.  · Avoid: Seeds, nuts, olives, avocados. Margarine, butter, cream, mayonnaise, salad oils, plain salad dressings. Plain gravy, crisp bacon without rind.  Beverages  · Recommended: Water, decaffeinated teas, oral rehydration solutions, sugar-free beverages not sweetened with sugar alcohols.  · Avoid: Fruit juices, caffeinated beverages (coffee, tea, soda), alcohol, sports drinks, or lemon-lime soda.  Condiments  · Recommended: Ketchup, mustard, horseradish, vinegar, cocoa powder. Spices in moderation: allspice, basil, bay leaves, celery powder or leaves, cinnamon, cumin powder, curry powder, ginger, mace, marjoram, onion or garlic powder, oregano, paprika, parsley flakes, ground pepper, rosemary, sage, savory, tarragon, thyme, turmeric.  · Avoid: Coconut, honey.  Document Released: 02/29/2004 Document Revised: 09/02/2012 Document Reviewed: 04/24/2012  Exit[employer]® Patient Information ©2014 [employer].

## 2014-02-03 NOTE — Progress Notes (Signed)
Patient ID: Jenny [name], female   DOB: 26-Jan-1961, 53 y.o.   MRN: [id number] Subjective:     Patient ID: Jenny [name], female   DOB: 1961-10-20, 53 y.o.   MRN: [id number]  HPI 53 y.o. F here for evaluation of black stool. Pt has been having loose stool since Monday. Pt reports diarrhea like water. 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 Pt reports that she is having thicker stools with decreased frequency.  Pt took pepto bismal on Tuesday and Wednesday. Feeling week and decreased appetite. No LOC, no dizziness.  Review of Systems Pt reports some improvement. Pt denies blood in stool. Denies camping or travel. Denies unfiltered water. No fevers or chills, +nausing with retching, no true emesis.    Objective:   Physical Exam Filed Vitals:   02/03/14 1343  BP: 152/90  Pulse: 75  Temp: 98.5 F (36.9 C)  NAD VSS RRR no mgt CTAb no wrc Minimal RUQ tenderness (Full feeling) no other tenderness, Neg  Rectal exam no blood or stool in vault. Neg heme occult     Assessment:     53 y.o. F with black stool after pepto bismal use. Likely a viral diarrhea. Recommend imodium PRN and hydration. Diet provided. F/u PRN for worsening bloody or black diarrhea. If able pt may return to work.  Fredrik Rigger, MD OB Fellow

## 2014-02-04 ENCOUNTER — Ambulatory Visit: Payer: BC Managed Care – PPO

## 2014-02-10 ENCOUNTER — Encounter: Payer: Self-pay | Admitting: Gastroenterology

## 2014-02-28 ENCOUNTER — Ambulatory Visit (AMBULATORY_SURGERY_CENTER): Payer: Self-pay

## 2014-02-28 VITALS — Ht 66.0 in | Wt 265.0 lb

## 2014-02-28 DIAGNOSIS — Z1211 Encounter for screening for malignant neoplasm of colon: Secondary | ICD-10-CM

## 2014-02-28 MED ORDER — MOVIPREP 100 G PO SOLR
1.0000 | Freq: Once | ORAL | Status: DC
Start: 2014-02-28 — End: 2014-11-20

## 2014-03-02 ENCOUNTER — Encounter: Payer: Self-pay | Admitting: Gastroenterology

## 2014-03-08 ENCOUNTER — Telehealth: Payer: Self-pay | Admitting: Family Medicine

## 2014-03-08 NOTE — Telephone Encounter (Signed)
Pt called and needs a letter stating that she has a metal plate in her neck with screws. 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 Please call when this is ready for pick up. Her flight is on Friday 03/11/14. Blima Rich

## 2014-03-08 NOTE — Telephone Encounter (Signed)
Pt is aware.  Jenny [name],CMA  

## 2014-03-08 NOTE — Telephone Encounter (Signed)
Sorry we Jenny [name] have any information about the plate what kind it is or what restrictions she needs.  She would need to contact whoever put it in her neck

## 2014-03-10 ENCOUNTER — Encounter: Payer: BC Managed Care – PPO | Admitting: Gastroenterology

## 2014-05-12 ENCOUNTER — Ambulatory Visit (HOSPITAL_COMMUNITY)
Admission: RE | Admit: 2014-05-12 | Discharge: 2014-05-12 | Disposition: A | Discharge status: Home / Self Care | Payer: BC Managed Care – PPO | Source: Ambulatory Visit | Attending: Family Medicine | Admitting: Family Medicine

## 2014-05-12 ENCOUNTER — Ambulatory Visit
Admission: RE | Admit: 2014-05-12 | Discharge: 2014-05-12 | Disposition: A | Discharge status: Home / Self Care | Payer: BC Managed Care – PPO | Source: Ambulatory Visit | Attending: Family Medicine | Admitting: Family Medicine

## 2014-05-12 ENCOUNTER — Encounter: Payer: Self-pay | Admitting: Family Medicine

## 2014-05-12 ENCOUNTER — Ambulatory Visit (INDEPENDENT_AMBULATORY_CARE_PROVIDER_SITE_OTHER): Payer: BC Managed Care – PPO | Admitting: Family Medicine

## 2014-05-12 VITALS — BP 144/93 | HR 72 | Temp 98.4°F | Resp 18 | Wt 262.0 lb

## 2014-05-12 DIAGNOSIS — R0602 Shortness of breath: Secondary | ICD-10-CM

## 2014-05-12 DIAGNOSIS — R079 Chest pain, unspecified: Secondary | ICD-10-CM

## 2014-05-12 LAB — CBC
HCT: 36.8 % (ref 36.0–46.0)
Hemoglobin: 11.8 g/dL — ABNORMAL LOW (ref 12.0–15.0)
MCH: 25.5 pg — ABNORMAL LOW (ref 26.0–34.0)
MCHC: 32.1 g/dL (ref 30.0–36.0)
MCV: 79.5 fL (ref 78.0–100.0)
Platelets: 380 10*3/uL (ref 150–400)
RBC: 4.63 MIL/uL (ref 3.87–5.11)
RDW: 15.9 % — ABNORMAL HIGH (ref 11.5–15.5)
WBC: 5.5 10*3/uL (ref 4.0–10.5)

## 2014-05-12 LAB — COMPLETE METABOLIC PANEL WITH GFR
ALT: 19 U/L (ref 0–35)
AST: 24 U/L (ref 0–37)
Albumin: 4.2 g/dL (ref 3.5–5.2)
Alkaline Phosphatase: 69 U/L (ref 39–117)
BUN: 10 mg/dL (ref 6–23)
CO2: 23 mEq/L (ref 19–32)
Calcium: 9.6 mg/dL (ref 8.4–10.5)
Chloride: 101 mEq/L (ref 96–112)
Creat: 0.7 mg/dL (ref 0.50–1.10)
GFR, Est African American: >89 mL/min
GFR, Est Non African American: >89 mL/min
Glucose, Bld: 123 mg/dL — ABNORMAL HIGH (ref 70–99)
Potassium: 4.9 mEq/L (ref 3.5–5.3)
Sodium: 138 mEq/L (ref 135–145)
Total Bilirubin: 0.4 mg/dL (ref 0.3–1.2)
Total Protein: 7.7 g/dL (ref 6.0–8.3)

## 2014-05-12 LAB — TSH: TSH: 0.852 u[IU]/mL (ref 0.350–4.500)

## 2014-05-12 LAB — PRO B NATRIURETIC PEPTIDE: Pro B Natriuretic peptide (BNP): <5 pg/mL (ref <126)

## 2014-05-12 MED ORDER — ASPIRIN 325 MG PO TABS
325.0000 mg | ORAL_TABLET | Freq: Once | ORAL | Status: AC
  Administered 2014-05-12: 325 mg via ORAL

## 2014-05-12 MED ORDER — ASPIRIN 325 MG PO TABS: 325.0000 mg | ORAL_TABLET | Freq: Every day | ORAL | Status: DC

## 2014-05-12 NOTE — Assessment & Plan Note (Signed)
Shortness of breath is concerning given orthopnea. However, patient is currently stable exam with no evidence of hypoxemia or tachypnea.  Additionally, no lower extremity edema or JVD was appreciated. Given this, will proceed with outpatient work up - pro BNP, and chest xray. Patient will likely need Echo in near future.

## 2014-05-12 NOTE — Patient Instructions (Signed)
It was nice to see you today.  I will call you with the results of your labs.  Please take a daily aspirin (81 mg).    If your symptoms worsen please go the to the ED.   Follow up Monday of next week.

## 2014-05-12 NOTE — Assessment & Plan Note (Addendum)
Patient with reports of left-sided chest pain today. EKG was obtained and revealed normal sinus rhythm.  No ST elevation or depression was noted.  Patient did have T-wave inversion in lead 3 and V2, but these are not contiguous leads and are nonspecific.  No prior EKG available for comparison. Given physical exam and history, chest pain appears atypical in nature.  Patient does have some features of angina, however tenderness to palpation is considerably against angina/ACS.  No element of GERD on history.  Patient has had a recent life stressor (anniversary of family death last week).   Patient stable on exam with normal vital signs and normal pulse ox.  No tachycardia or hypoxemia to suggest pulmonary embolus.  Given this, will proceed with outpatient work up - CBC, CMP, Chest xray, proBNP. Return precautions given- if symptoms worsen or fail to improve patient is to go to the emergency room.  I precepted this case with Attending Dr. Erin Hearing, who agreed with the above.

## 2014-05-12 NOTE — Progress Notes (Signed)
   Subjective:    Patient ID: Jenny [name], female    DOB: 06/19/1961, 53 y.o.   MRN: [id number]  HPI 53 year old African American female with history of obesity presents for same day appointment with complaints of chest pain and shortness of breath.  1) Chest pain  Patient reports that she has had chest pain intermittently over the past week.  Pain lasts for minutes to hours.  Pain is located at the left upper chest.  Pain described as sharp in character.  Pain is also achy at times.  Patient currently reports that her pain is 3-4/10 in severity.  Pain is exacerbated by activity and relieved by rest.  Patient also notes that pain is worse with certain activities.  For example, this morning she had pain while rolling over in bed.  She denies any prior history of chest pain.   2) SOB  Patient endorses shortness of breath x3-4 weeks.  She states her shortness of breath is primarily with exertion.  She is now finding it more difficult to ambulate short distances.  Does have associated chest pain intermittently.  She denies any PND. She does endorse 4 pillow orthopnea (she was using 2 pillows prior).  No recent fevers, chills.   Patient also endorses associated lower extremity edema.  However, she states that is improved today.  Soc Hx - Nonsmoker  Review of Systems Per HPI with the following additions: No reports of heartburn, epigastric discomfort.    Objective:   Physical Exam Filed Vitals:   05/12/14 0951  BP: 144/93  Pulse: 72  Temp: 98.4 F (36.9 C)  Resp: 18   Exam: General: well appearing female. Patient does not appear in any acute distress. Neck: Thyromegaly noted. No JVD appreciated.  Cardiovascular: RRR. No murmurs, rubs, or gallops.  Upper chest tender to palpation. Respiratory: CTAB. No rales, rhonchi, or wheeze. Extremities: Trace lower extremity edema.    Assessment & Plan:  See Problem List

## 2014-05-17 ENCOUNTER — Telehealth: Payer: Self-pay | Admitting: *Deleted

## 2014-05-17 NOTE — Telephone Encounter (Signed)
Message copied by Corinna Capra on Tue May 17, 2014  8:55 AM ------      Message from: Coral Spikes      Created: Sat May 14, 2014  8:50 PM       Please call and inform patient of normal chest xray.  Labs also normal other than mild anemia.            Thanks            Jayce ------

## 2014-05-17 NOTE — Telephone Encounter (Signed)
Patient informed.[place]

## 2014-08-11 ENCOUNTER — Encounter: Payer: Self-pay | Admitting: Family Medicine

## 2014-08-11 ENCOUNTER — Ambulatory Visit (INDEPENDENT_AMBULATORY_CARE_PROVIDER_SITE_OTHER): Payer: BC Managed Care – PPO | Admitting: Family Medicine

## 2014-08-11 VITALS — BP 110/68 | HR 72 | Ht 66.0 in | Wt 262.0 lb

## 2014-08-11 DIAGNOSIS — Z1211 Encounter for screening for malignant neoplasm of colon: Secondary | ICD-10-CM

## 2014-08-11 DIAGNOSIS — K625 Hemorrhage of anus and rectum: Secondary | ICD-10-CM | POA: Insufficient documentation

## 2014-08-11 MED ORDER — POLYETHYLENE GLYCOL 3350 17 GM/SCOOP PO POWD: 17.0000 g | Freq: Every day | ORAL | Status: DC

## 2014-08-11 NOTE — Patient Instructions (Signed)
The bleeding could potentially be caused by an internal hemorrhoid that it looked like you had on exam today. The treatment for this would be using fiber (miralax) to make your bowel movements looser, with a goal of at least 1 bowel movement a day, and if you develop any itchiness or pain a topical cream.  I would like you to go be seen by the GI doctor, as you still need your colonoscopy to screen for colon cancer.

## 2014-08-11 NOTE — Progress Notes (Signed)
Patient ID: Jenny [name], female   DOB: [birth year], 53 y.o.   MRN: [id number]   Subjective:    Patient ID: Jenny [name], female    DOB: [birth year], 53 y.o.   MRN: [id number]  HPI  CC: Blood in stool  # Rectal bleeding:  First noticed near the end of June; noticed 1-2 episodes in July, since beginning of August happening 2-3 times a week  First episode: noticed red part of stool, toilet paper being red when she wiped. Noticed toilet water had small drops of blood (like food coloring drops).  Worsened episode yesterday: similar but a lot more red, more loose  Scheduled to have a colonoscopy last March but canceled it  Reports no family history of colon cancer  Bowel pattern: regularly every day, used to have a lot of straining when episodes first started  Attempted diet changes to remove red foods  No abdominal pain, no rectal pain. No itchiness.  ROS: tired (thinks because of lack of sleep), no dizziness, no CP, SOB, heartburn  Review of Systems   See HPI for ROS. All other systems reviewed and are negative. Objective:  BP 110/68  Pulse 72  Ht 5\' 6"  (1.676 m)  Wt 262 lb (118.842 kg)  BMI 42.31 kg/m2 Vitals reviewed  General: NAD, obese AA female CV: RRR, normal s1 and s2, no murmurs, 2+ radial pulses bilaterally Resp: CTAB, normal effort Rectal: small non-thrombosed external hemorrhoid at 6 o'clock, tone normal, no appreciable masses. Anoscopy: difficult exam due to patient discomfort, appears to have additional small internal hemorrhoid at 6 c'clock. Grossly positive FOBT. Skin: normal skin tone, no palmar crease pallor.    Assessment & Plan:  See Problem List Documentation'

## 2014-08-12 NOTE — Assessment & Plan Note (Signed)
Suspect from bleeding internal hemorrhoid. Patient has not had screening colonoscopy yet. No major red flags, do not think blood loss is enough to cause anemia. FOBT grossly positive on exam. Plan: miralax daily to achieve 1 loose stool a day. Referral and given info to call GI regarding screening colonoscopy.

## 2014-08-26 ENCOUNTER — Encounter: Payer: Self-pay | Admitting: Gastroenterology

## 2014-09-14 ENCOUNTER — Telehealth: Payer: Self-pay | Admitting: *Deleted

## 2014-09-14 NOTE — Telephone Encounter (Signed)
Dr Deatra Ina: pt scheduled for direct screening colonoscopy 10/9.  PV scheduled for Friday 9/25.  According to OV with Dr. Lamar Benes 8/20; pt is having rectal bleeding.  He also wrote: " I would like you to go be seen by the GI doctor, as you still need your colonoscopy to screen for colon cancer. "  Please review OV: does pt need OV or okay for direct?  Thanks, Juliann Pulse

## 2014-09-15 NOTE — Telephone Encounter (Signed)
I would prefer to see her in the office prior to colonoscopy

## 2014-09-15 NOTE — Telephone Encounter (Signed)
Called pt's number and talked to her husband who states pt isn't home at this time. Explained to husband that Jenny [name] needs to see Dr Deatra Ina in the office for a visit before we do her colonoscopy, per Dr Kelby Fam note in this TE. Marland Kitchen He stated he understood the reasoning for this and would get his wife to call and schedule an appointment with Dr Deatra Ina.  Gave office number to him for her to call. Explained to the husband that her pre visit and her colon for 10-9 will be cancelled and once she makes an OV with Dr Deatra Ina, this will all be rescheduled. He verbalized understanding of these instructions and will have wife call us back. ewm

## 2014-09-19 ENCOUNTER — Telehealth: Payer: Self-pay | Admitting: Family Medicine

## 2014-09-19 NOTE — Telephone Encounter (Signed)
Spoke with pt's husband; stating that pt is still having passing blood in her stool.  The tried to schedule an appt with Dr. Deatra Ina, but no appt is available until Nov 21, 2014.  Pt would like to be seen sooner.  Pt will need to have a office visit with Dr. Deatra Ina before the colonoscopy appt is made.  Per Dr. Erin Hearing; have pt come in for same day appt in the morning.  Pt prefers to be seen in the afternoon; appt with Dr. Lamar Benes at 3:30 PM 09/20/2014.  Pt and husband advised to go to urgent care of ED if chest pain; increase in bleeding, SOB, dizziness occur.   Derl Barrow, RN

## 2014-09-19 NOTE — Telephone Encounter (Signed)
Pt needs advice, was scheduled for colonoscopy but it was cancelled, see previous phone note. Pt is still passing blood in her stool and needs to know what to do until she can have colonoscopy.

## 2014-09-20 ENCOUNTER — Encounter: Payer: Self-pay | Admitting: Family Medicine

## 2014-09-20 ENCOUNTER — Ambulatory Visit (INDEPENDENT_AMBULATORY_CARE_PROVIDER_SITE_OTHER): Payer: BC Managed Care – PPO | Admitting: Family Medicine

## 2014-09-20 VITALS — BP 112/75 | HR 88 | Temp 98.6°F | Ht 66.0 in | Wt 260.0 lb

## 2014-09-20 DIAGNOSIS — K625 Hemorrhage of anus and rectum: Secondary | ICD-10-CM

## 2014-09-20 LAB — POCT HEMOGLOBIN: Hemoglobin: 9.2 g/dL — AB (ref 12.2–16.2)

## 2014-09-20 MED ORDER — FERROUS SULFATE 325 (65 FE) MG PO TABS: 325.0000 mg | ORAL_TABLET | Freq: Every day | ORAL | Status: DC

## 2014-09-20 NOTE — Progress Notes (Signed)
Patient ID: Jenny [name], female   DOB: [birth year], 53 y.o.   MRN: [id number]   Subjective:    Patient ID: Jenny [name], female    DOB: Feb 20, 1961, 53 y.o.   MRN: [id number]  HPI  Patient presents for Same Day Appointment  CC: rectal bleeding  # Rectal bleeding:  Seen for this issue by me 1 month ago, symptoms have been present since July  Notices blood in stool 2-3 times a week, yesterday "real heavy"  Blood mixed in the stool, doesn't have drops of blood around the bowl.  About 1 BM a day, taking miralax.   Weak, especially after BM  Was scheduled to see GI, however this appt was canceled ROS: no syncope, +lightheadedness intermittently, no CP, no SOB, no nausea/vomiting, no fevers/chills  Review of Systems   See HPI for ROS. All other systems reviewed and are negative.  Past medical history, surgical, family, and social history reviewed and updated in the EMR as appropriate.  Objective:  BP 112/75  Pulse 88  Temp(Src) 98.6 F (37 C) (Oral)  Ht 5\' 6"  (1.676 m)  Wt 260 lb (117.935 kg)  BMI 41.99 kg/m2 Vitals reviewed  General: NAD HEENT: conjunctival pallor CV: RRR, normal s1 and s2, systolic flow murmur present (noted as new), 2+ radial and PT pulses bilat Resp: CTAB, normal effort  Abdomen: soft, mildly tender throughout all 4 quadrants, no rebound or guarding. Bowel sounds are normal Rectal: patient asks to defer this Ext: no edema or cyanosis Skin: no rashes  Assessment & Plan:  See Problem List Documentation

## 2014-09-20 NOTE — Assessment & Plan Note (Signed)
Still present, hgb today in office 9.2 (down from 11.8 in May). Called and appt scheduled for GI on Thursday. Will start iron supplement now. Discussed red flags and need to go to ED for transfusion, patient verbalized understanding.

## 2014-09-20 NOTE — Patient Instructions (Signed)
Keep your appointment on Thursday with the GI doctor. It is very important you don't miss this.  If you develop any of the following symptoms, go to the ED: chest pains, dizziness/lightheadedness that is persistent, passing out, a lot of blood is coming out with a bowel movement.

## 2014-09-30 ENCOUNTER — Encounter: Payer: BC Managed Care – PPO | Admitting: Gastroenterology

## 2014-10-07 ENCOUNTER — Other Ambulatory Visit: Payer: Self-pay | Admitting: Gastroenterology

## 2014-10-07 DIAGNOSIS — K6389 Other specified diseases of intestine: Secondary | ICD-10-CM

## 2014-10-10 ENCOUNTER — Ambulatory Visit
Admission: RE | Admit: 2014-10-10 | Discharge: 2014-10-10 | Disposition: A | Discharge status: Home / Self Care | Payer: BC Managed Care – PPO | Source: Ambulatory Visit | Attending: Gastroenterology | Admitting: Gastroenterology

## 2014-10-10 DIAGNOSIS — K6389 Other specified diseases of intestine: Secondary | ICD-10-CM

## 2014-10-10 MED ORDER — IOHEXOL 300 MG/ML  SOLN
125.0000 mL | Freq: Once | INTRAMUSCULAR | Status: AC | PRN
  Administered 2014-10-10: 125 mL via INTRAVENOUS

## 2014-10-17 ENCOUNTER — Other Ambulatory Visit (INDEPENDENT_AMBULATORY_CARE_PROVIDER_SITE_OTHER): Payer: Self-pay | Admitting: General Surgery

## 2014-10-17 ENCOUNTER — Other Ambulatory Visit (INDEPENDENT_AMBULATORY_CARE_PROVIDER_SITE_OTHER): Payer: Self-pay

## 2014-10-17 DIAGNOSIS — C189 Malignant neoplasm of colon, unspecified: Secondary | ICD-10-CM

## 2014-10-17 NOTE — H&P (Signed)
Jenny [name]. [place] 10/17/2014 2:26 PM Location: [facility] Patient #: [id number] DOB: May 10, 1961 Married / Language: English / Race: Black or African American Female History of Present Illness Jenny Ruff MD; 98/92/1194 4:09 PM) Patient words: NP--colon cancer.  The patient is a 53 year old female who presents with colorectal cancer. The patient is being seen for initial consultation for cancer of the sigmoid colon.. The patient was referred by a gastroenterologist (Dr Collene Mares). Initial presentation was a few month(s) ago for hematochezia.. Current diagnosis was determined by colonoscopy. Tumor markers include carcinoembryonic antigen level of <0.5. CT scan of the abdomen and pelvis shows sigmoid colon eccentric wall thickening. There no signs of metastatic disease. Symptoms do not include abdominal pain, constipation, nausea, vomiting or weight loss.. Onset was Onset was gradual. Other Problems Jenny [name]; 10/17/2014 2:26 PM) Colon Cancer  Past Surgical History Jenny [name]; 10/17/2014 2:26 PM) Cesarean Section - 1 Colon Polyp Removal - Colonoscopy Spinal Surgery - Neck Tonsillectomy  Diagnostic Studies History Jenny [name]; 10/17/2014 2:26 PM) Colonoscopy within last year Pap Smear >5 years ago  Allergies Jenny [name]; 10/17/2014 2:27 PM) No Known Drug Allergies 10/17/2014  Medication History [name]; 10/17/2014 2:28 PM) Gabapentin (100MG  Capsule, Oral) Active. Tylenol Extra Strength (500MG  Tablet, Oral) Active. MiraLax (Oral) Active. Ferrous Sulfate CR (325MG  Tablet ER, Oral) Active.  Social History Jenny [name]; 10/17/2014 2:26 PM) Caffeine use Carbonated beverages, Coffee, Tea. No alcohol use No drug use Tobacco use Never smoker.  Family History Jenny [name]; 10/17/2014 2:26 PM) Cancer Brother. Heart Disease Mother. Hypertension Mother. Thyroid problems Mother.  Pregnancy / Birth History Jenny [name]; 10/17/2014 2:26 PM) Age at menarche 41 years. Gravida 3 Irregular periods Maternal age 61-20 Para 3     Review of Systems Jenny [name]; 10/17/2014 2:26 PM) General Present- Chills and Fatigue. Not Present- Appetite Loss, Fever, Night Sweats, Weight Gain and Weight Loss. Skin Not Present- Change in Wart/Mole, Dryness, Hives, Jaundice, New Lesions, Non-Healing Wounds, Rash and Ulcer. HEENT Not Present- Earache, Hearing Loss, Hoarseness, Nose Bleed, Oral Ulcers, Ringing in the Ears, Seasonal Allergies, Sinus Pain, Sore Throat, Visual Disturbances, Wears glasses/contact lenses and Yellow Eyes. Respiratory Present- Snoring. Not Present- Bloody sputum, Chronic Cough, Difficulty Breathing and Wheezing. Breast Not Present- Breast Mass, Breast Pain, Nipple Discharge and Skin Changes. Cardiovascular Not Present- Chest Pain, Difficulty Breathing Lying Down, Leg Cramps, Palpitations, Rapid Heart Rate, Shortness of Breath and Swelling of Extremities. Gastrointestinal Present- Abdominal Pain and Bloody Stool. Not Present- Bloating, Change in Bowel Habits, Chronic diarrhea, Constipation, Difficulty Swallowing, Excessive gas, Gets full quickly at meals, Hemorrhoids, Indigestion, Nausea, Rectal Pain and Vomiting. Female Genitourinary Not Present- Frequency, Nocturia, Painful Urination, Pelvic Pain and Urgency. Musculoskeletal Not Present- Back Pain, Joint Pain, Joint Stiffness, Muscle Pain, Muscle Weakness and Swelling of Extremities. Neurological Present- Tingling. Not Present- Decreased Memory, Fainting, Headaches, Numbness, Seizures, Tremor, Trouble walking and Weakness. Psychiatric Present- Change in Sleep Pattern. Not Present- Anxiety, Bipolar, Depression, Fearful and Frequent crying. Endocrine Not Present- Cold Intolerance, Excessive Hunger, Hair Changes, Heat Intolerance, Hot flashes and New Diabetes. Hematology Not Present- Easy Bruising, Excessive bleeding, Gland problems, HIV and  Persistent Infections.  Vitals [name]; 10/17/2014 2:28 PM) 10/17/2014 2:28 PM Weight: 259 lb Height: 65in Body Surface Area: 2.32 m Body Mass Index: 43.1 kg/m Temp.: 47F(Tympanic)  Pulse: 76 (Regular)  Resp.: 18 (Unlabored)  BP: 130/76 (Sitting, Left Arm, Standard)     Physical Exam Jenny Ruff MD; 17/40/8144 4:11 PM)  General Mental Status-Alert. General Appearance-Not  in acute distress, Not Sickly. Orientation-Oriented X3. Hydration-Well hydrated. Voice-Normal.  Integumentary Global Assessment Upon inspection and palpation of skin surfaces of the - Axillae: non-tender, no inflammation or ulceration, no drainage. and Distribution of scalp and body hair is normal. General Characteristics Temperature - normal warmth is noted.  Head and Neck Head-normocephalic, atraumatic with no lesions or palpable masses. Face Global Assessment - atraumatic, no absence of expression. Neck Global Assessment - no abnormal movements, no bruit auscultated on the right, no bruit auscultated on the left, no decreased range of motion, non-tender. Trachea-midline. Thyroid Gland Characteristics - non-tender.  ENMT Ears Pinna - Left - no drainage observed, no generalized tenderness observed. Right - no drainage observed, no generalized tenderness observed. Nose and Sinuses External Inspection of the Nose - no destructive lesion observed. Inspection of the nares - Left - quiet respiration. Right - quiet respiration. Mouth and Throat Lips - Upper Lip - no fissures observed, no pallor noted. Lower Lip - no fissures observed, no pallor noted. Nasopharynx - no discharge present. Oral Cavity/Oropharynx - Tongue - no dryness observed. Oral Mucosa - no cyanosis observed. Hypopharynx - no evidence of airway distress observed.  Chest and Lung Exam Inspection Movements - Normal and Symmetrical. Accessory muscles - No use of accessory muscles in  breathing. Palpation Palpation of the chest reveals - Non-tender. Auscultation Breath sounds - Normal and Clear.  Cardiovascular Auscultation Rhythm - Regular. Murmurs & Other Heart Sounds - Auscultation of the heart reveals - No Murmurs and No Systolic Clicks.  Abdomen Inspection Inspection of the abdomen reveals - No Visible peristalsis and No Abnormal pulsations. Note: lower midline scar. Palpation/Percussion Palpation and Percussion of the abdomen reveal - Soft, Non Tender, No Rebound tenderness, No Rigidity (guarding) and No Cutaneous hyperesthesia.  Peripheral Vascular Upper Extremity Inspection - Left - No Cyanotic nailbeds, Not Ischemic. Right - No Cyanotic nailbeds, Not Ischemic.  Neurologic Neurologic evaluation reveals -normal attention span and ability to concentrate, able to name objects and repeat phrases. Appropriate fund of knowledge , normal sensation and normal coordination. Mental Status Affect - not angry, not paranoid. Cranial Nerves-Normal Bilaterally. Gait-Normal.  Neuropsychiatric Mental status exam performed with findings of-able to articulate well with normal speech/language, rate, volume and coherence, thought content normal with ability to perform basic computations and apply abstract reasoning and no evidence of hallucinations, delusions, obsessions or homicidal/suicidal ideation.  Musculoskeletal Global Assessment Spine, Ribs and Pelvis - no instability, subluxation or laxity. Right Upper Extremity - no instability, subluxation or laxity.    Assessment & Plan Jenny Ruff MD; 53/29/9242 4:16 PM)  LOCALIZED CANCER OF COLON (153.9  C18.9) Story: This is a 53 year old female who was found to have a sigmoid colon mass on diagnostic colonoscopy. Biopsy revealed invasive adenocarcinoma, moderately differentiated. CT scan of the abdomen and pelvis show no signs of metastatic disease. CEA level was normal. Impression: Her physical exam is  benign. I have recommended surgical excision of her sigmoid colon. we discussed robotic and laparoscopic techniques. I will send her back to Dr. Collene Mares for tattooing of the lesion. She also will get a chest CT to complete her metastatic workup.  The surgery and anatomy were described to the patient as well as the risks of surgery and the possible complications. These include: Bleeding, deep abdominal infections and possible wound complications such as hernia and infection, damage to adjacent structures, leak of surgical connections, which can lead to other surgeries and possibly an ostomy, possible need for other procedures, such as abscess  drains in radiology, possible prolonged hospital stay, possible diarrhea from removal of part of the colon, possible constipation from narcotics, possible bowel, bladder or sexual dysfunction if having rectal surgery, prolonged fatigue/weakness or appetite loss, possible early recurrence of of disease, possible complications of their medical problems such as heart disease or arrhythmias or lung problems, death (less than 1%). I believe the patient understands and wishes to proceed with the surgery.

## 2014-10-19 ENCOUNTER — Inpatient Hospital Stay: Admission: RE | Admit: 2014-10-19 | Payer: BC Managed Care – PPO | Source: Ambulatory Visit

## 2014-10-25 ENCOUNTER — Ambulatory Visit
Admission: RE | Admit: 2014-10-25 | Discharge: 2014-10-25 | Disposition: A | Discharge status: Home / Self Care | Payer: BC Managed Care – PPO | Source: Ambulatory Visit | Attending: General Surgery | Admitting: General Surgery

## 2014-10-25 DIAGNOSIS — C189 Malignant neoplasm of colon, unspecified: Secondary | ICD-10-CM

## 2014-10-25 MED ORDER — IOHEXOL 300 MG/ML  SOLN
75.0000 mL | Freq: Once | INTRAMUSCULAR | Status: AC | PRN
  Administered 2014-10-25: 75 mL via INTRAVENOUS

## 2014-11-08 NOTE — Patient Instructions (Addendum)
Aarion Kittrell Wixon  11/08/2014   Your procedure is scheduled on:  11/14/2014    Come thru the [facility] entrance.   Follow the Signs to [place] at   0530     am  Call this number if you have problems the morning of surgery: [phone]   Remember:   Do not eat food or drink liquids after midnight.   Take these medicines the morning of surgery with A SIP OF WATER: none    Do not wear jewelry, make-up or nail polish.  Do not wear lotions, powders, or perfumes.  deodorant.  Do not shave 48 hours prior to surgery.   Do not bring valuables to the hospital.  Contacts, dentures or bridgework may not be worn into surgery.  Leave suitcase in the car. After surgery it may be brought to your room.  For patients admitted to the hospital, checkout time is 11:00 AM the day of  discharge.            Please read over the following fact sheets that you were given: [facility] - Preparing for Surgery Before surgery, you can play an important role.  Because skin is not sterile, your skin needs to be as free of germs as possible.  You can reduce the number of germs on your skin by washing with CHG (chlorahexidine gluconate) soap before surgery.  CHG is an antiseptic cleaner which kills germs and bonds with the skin to continue killing germs even after washing. Please DO NOT use if you have an allergy to CHG or antibacterial soaps.  If your skin becomes reddened/irritated stop using the CHG and inform your nurse when you arrive at Short Stay. Do not shave (including legs and underarms) for at least 48 hours prior to the first CHG shower.  You may shave your face/neck. Please follow these instructions carefully:  1.  Shower with CHG Soap the night before surgery and the  morning of Surgery.  2.  If you choose to wash your hair, wash your hair first as usual with your  normal  shampoo.  3.  After you shampoo, rinse your hair and body thoroughly to remove the  shampoo.                           4.   Use CHG as you would any other liquid soap.  You can apply chg directly  to the skin and wash                       Gently with a scrungie or clean washcloth.  5.  Apply the CHG Soap to your body ONLY FROM THE NECK DOWN.   Do not use on face/ open                           Wound or open sores. Avoid contact with eyes, ears mouth and genitals (private parts).                       Wash face,  Genitals (private parts) with your normal soap.             6.  Wash thoroughly, paying special attention to the area where your surgery  will be performed.  7.  Thoroughly rinse your body with warm water from the neck down.  8.  DO NOT  shower/wash with your normal soap after using and rinsing off  the CHG Soap.                9.  Pat yourself dry with a clean towel.            10.  Wear clean pajamas.            11.  Place clean sheets on your bed the night of your first shower and do not  sleep with pets. Day of Surgery : Do not apply any lotions/deodorants the morning of surgery.  Please wear clean clothes to the hospital/surgery center.  FAILURE TO FOLLOW THESE INSTRUCTIONS MAY RESULT IN THE CANCELLATION OF YOUR SURGERY PATIENT SIGNATURE_________________________________  NURSE SIGNATURE__________________________________  ________________________________________________________________________  WHAT IS A BLOOD TRANSFUSION? Blood Transfusion Information  A transfusion is the replacement of blood or some of its parts. Blood is made up of multiple cells which provide different functions.  Red blood cells carry oxygen and are used for blood loss replacement.  White blood cells fight against infection.  Platelets control bleeding.  Plasma helps clot blood.  Other blood products are available for specialized needs, such as hemophilia or other clotting disorders. BEFORE THE TRANSFUSION  Who gives blood for transfusions?   Healthy volunteers who are fully evaluated to make sure their blood is  safe. This is blood bank blood. Transfusion therapy is the safest it has ever been in the practice of medicine. Before blood is taken from a donor, a complete history is taken to make sure that person has no history of diseases nor engages in risky social behavior (examples are intravenous drug use or sexual activity with multiple partners). The donor's travel history is screened to minimize risk of transmitting infections, such as malaria. The donated blood is tested for signs of infectious diseases, such as HIV and hepatitis. The blood is then tested to be sure it is compatible with you in order to minimize the chance of a transfusion reaction. If you or a relative donates blood, this is often done in anticipation of surgery and is not appropriate for emergency situations. It takes many days to process the donated blood. RISKS AND COMPLICATIONS Although transfusion therapy is very safe and saves many lives, the main dangers of transfusion include:   Getting an infectious disease.  Developing a transfusion reaction. This is an allergic reaction to something in the blood you were given. Every precaution is taken to prevent this. The decision to have a blood transfusion has been considered carefully by your caregiver before blood is given. Blood is not given unless the benefits outweigh the risks. AFTER THE TRANSFUSION  Right after receiving a blood transfusion, you will usually feel much better and more energetic. This is especially true if your red blood cells have gotten low (anemic). The transfusion raises the level of the red blood cells which carry oxygen, and this usually causes an energy increase.  The nurse administering the transfusion will monitor you carefully for complications. HOME CARE INSTRUCTIONS  No special instructions are needed after a transfusion. You may find your energy is better. Speak with your caregiver about any limitations on activity for underlying diseases you may  have. SEEK MEDICAL CARE IF:   Your condition is not improving after your transfusion.  You develop redness or irritation at the intravenous (IV) site. SEEK IMMEDIATE MEDICAL CARE IF:  Any of the following symptoms occur over the next 12 hours:  Shaking chills.  You have a  temperature by mouth above 102 F (38.9 C), not controlled by medicine.  Chest, back, or muscle pain.  People around you feel you are not acting correctly or are confused.  Shortness of breath or difficulty breathing.  Dizziness and fainting.  You get a rash or develop hives.  You have a decrease in urine output.  Your urine turns a dark color or changes to pink, red, or brown. Any of the following symptoms occur over the next 10 days:  You have a temperature by mouth above 102 F (38.9 C), not controlled by medicine.  Shortness of breath.  Weakness after normal activity.  The white part of the eye turns yellow (jaundice).  You have a decrease in the amount of urine or are urinating less often.  Your urine turns a dark color or changes to pink, red, or brown. Document Released: 12/06/2000 Document Revised: 03/02/2012 Document Reviewed: 07/25/2008 ExitCare Patient Information 2014 ExitCare, [place].  _______________________________________________________________________, coughing and deep breathing exercises, leg exercises            [facility] - Preparing for Surgery Before surgery, you can play an important role.  Because skin is not sterile, your skin needs to be as free of germs as possible.  You can reduce the number of germs on your skin by washing with CHG (chlorahexidine gluconate) soap before surgery.  CHG is an antiseptic cleaner which kills germs and bonds with the skin to continue killing germs even after washing. Please DO NOT use if you have an allergy to CHG or antibacterial soaps.  If your skin becomes reddened/irritated stop using the CHG and inform your nurse when you arrive at Short  Stay. Do not shave (including legs and underarms) for at least 48 hours prior to the first CHG shower.  You may shave your face/neck. Please follow these instructions carefully:  1.  Shower with CHG Soap the night before surgery and the  morning of Surgery.  2.  If you choose to wash your hair, wash your hair first as usual with your  normal  shampoo.  3.  After you shampoo, rinse your hair and body thoroughly to remove the  shampoo.                           4.  Use CHG as you would any other liquid soap.  You can apply chg directly  to the skin and wash                       Gently with a scrungie or clean washcloth.  5.  Apply the CHG Soap to your body ONLY FROM THE NECK DOWN.   Do not use on face/ open                           Wound or open sores. Avoid contact with eyes, ears mouth and genitals (private parts).                       Wash face,  Genitals (private parts) with your normal soap.             6.  Wash thoroughly, paying special attention to the area where your surgery  will be performed.  7.  Thoroughly rinse your body with warm water from the neck down.  8.  DO NOT shower/wash with your normal soap  after using and rinsing off  the CHG Soap.                9.  Pat yourself dry with a clean towel.            10.  Wear clean pajamas.            11.  Place clean sheets on your bed the night of your first shower and do not  sleep with pets. Day of Surgery : Do not apply any lotions/deodorants the morning of surgery.  Please wear clean clothes to the hospital/surgery center.  FAILURE TO FOLLOW THESE INSTRUCTIONS MAY RESULT IN THE CANCELLATION OF YOUR SURGERY PATIENT SIGNATURE_________________________________  NURSE SIGNATURE__________________________________  ________________________________________________________________________

## 2014-11-09 ENCOUNTER — Encounter (HOSPITAL_COMMUNITY)
Admission: RE | Admit: 2014-11-09 | Discharge: 2014-11-09 | Disposition: A | Discharge status: Home / Self Care | Payer: BC Managed Care – PPO | Source: Ambulatory Visit | Attending: General Surgery | Admitting: General Surgery

## 2014-11-09 ENCOUNTER — Encounter (HOSPITAL_COMMUNITY): Payer: Self-pay

## 2014-11-09 DIAGNOSIS — Z01812 Encounter for preprocedural laboratory examination: Secondary | ICD-10-CM | POA: Diagnosis not present

## 2014-11-09 HISTORY — DX: Anemia, unspecified: D64.9

## 2014-11-09 HISTORY — DX: Malignant (primary) neoplasm, unspecified: C80.1

## 2014-11-09 HISTORY — DX: Nontoxic goiter, unspecified: E04.9

## 2014-11-09 LAB — CBC
HCT: 32.1 % — ABNORMAL LOW (ref 36.0–46.0)
Hemoglobin: 9.7 g/dL — ABNORMAL LOW (ref 12.0–15.0)
MCH: 22.6 pg — ABNORMAL LOW (ref 26.0–34.0)
MCHC: 30.2 g/dL (ref 30.0–36.0)
MCV: 74.7 fL — ABNORMAL LOW (ref 78.0–100.0)
Platelets: 371 10*3/uL (ref 150–400)
RBC: 4.3 MIL/uL (ref 3.87–5.11)
RDW: 17.3 % — ABNORMAL HIGH (ref 11.5–15.5)
WBC: 6.7 10*3/uL (ref 4.0–10.5)

## 2014-11-09 LAB — BASIC METABOLIC PANEL
Anion gap: 11 (ref 5–15)
BUN: 12 mg/dL (ref 6–23)
CO2: 25 mEq/L (ref 19–32)
Calcium: 9.6 mg/dL (ref 8.4–10.5)
Chloride: 103 mEq/L (ref 96–112)
Creatinine, Ser: 0.73 mg/dL (ref 0.50–1.10)
GFR calc Af Amer: >90 mL/min (ref >90)
GFR calc non Af Amer: >90 mL/min (ref >90)
Glucose, Bld: 118 mg/dL — ABNORMAL HIGH (ref 70–99)
Potassium: 4.5 mEq/L (ref 3.7–5.3)
Sodium: 139 mEq/L (ref 137–147)

## 2014-11-09 LAB — HEMOGLOBIN A1C
Hgb A1c MFr Bld: 7 % — ABNORMAL HIGH (ref <5.7)
Mean Plasma Glucose: 154 mg/dL — ABNORMAL HIGH (ref <117)

## 2014-11-09 LAB — ABO/RH: ABO/RH(D): A POS

## 2014-11-09 NOTE — Progress Notes (Signed)
EKG_ 05/12/14 EPIC  Ct Chest- 10/25/14 EPIC

## 2014-11-09 NOTE — Progress Notes (Signed)
Cbc and bmet results routed to inbasket of dr Marcello Moores by epic

## 2014-11-13 NOTE — Anesthesia Preprocedure Evaluation (Addendum)
Anesthesia Evaluation  Patient identified by MRN, date of birth, ID band Patient awake    Reviewed: Allergy & Precautions, H&P , NPO status , Patient's Chart, lab work & pertinent test results  Airway Mallampati: II  TM Distance: >3 FB Neck ROM: full    Dental no notable dental hx. (+) Teeth Intact, Dental Advisory Given   Pulmonary shortness of breath and with exertion,  breath sounds clear to auscultation  Pulmonary exam normal       Cardiovascular Exercise Tolerance: Good negative cardio ROS  Rhythm:regular Rate:Normal     Neuro/Psych Cervical spine surgery negative neurological ROS  negative psych ROS   GI/Hepatic negative GI ROS, Neg liver ROS,   Endo/Other  negative endocrine ROSMorbid obesity  Renal/GU negative Renal ROS  negative genitourinary   Musculoskeletal   Abdominal (+) + obese,   Peds  Hematology negative hematology ROS (+) anemia , hgb 9.7   Anesthesia Other Findings   Reproductive/Obstetrics negative OB ROS                           Anesthesia Physical Anesthesia Plan  ASA: III  Anesthesia Plan: General   Post-op Pain Management:    Induction: Intravenous  Airway Management Planned: Oral ETT  Additional Equipment:   Intra-op Plan:   Post-operative Plan: Extubation in OR  Informed Consent: I have reviewed the patients History and Physical, chart, labs and discussed the procedure including the risks, benefits and alternatives for the proposed anesthesia with the patient or authorized representative who has indicated his/her understanding and acceptance.   Dental Advisory Given  Plan Discussed with: CRNA and Surgeon  Anesthesia Plan Comments:        Anesthesia Quick Evaluation

## 2014-11-14 ENCOUNTER — Encounter (HOSPITAL_COMMUNITY): Payer: Self-pay

## 2014-11-14 ENCOUNTER — Inpatient Hospital Stay (HOSPITAL_COMMUNITY)
Admission: RE | Admit: 2014-11-14 | Discharge: 2014-11-20 | DRG: 330 | Disposition: A | Discharge status: Home / Self Care | Payer: BC Managed Care – PPO | Source: Ambulatory Visit | Attending: General Surgery | Admitting: General Surgery

## 2014-11-14 ENCOUNTER — Inpatient Hospital Stay (HOSPITAL_COMMUNITY): Payer: BC Managed Care – PPO | Admitting: Anesthesiology

## 2014-11-14 ENCOUNTER — Encounter (HOSPITAL_COMMUNITY)
Admission: RE | Disposition: A | Discharge status: Home / Self Care | Payer: Self-pay | Source: Ambulatory Visit | Attending: General Surgery

## 2014-11-14 DIAGNOSIS — Z85038 Personal history of other malignant neoplasm of large intestine: Secondary | ICD-10-CM | POA: Diagnosis present

## 2014-11-14 DIAGNOSIS — Z6841 Body Mass Index (BMI) 40.0 and over, adult: Secondary | ICD-10-CM | POA: Diagnosis not present

## 2014-11-14 DIAGNOSIS — C189 Malignant neoplasm of colon, unspecified: Secondary | ICD-10-CM | POA: Diagnosis present

## 2014-11-14 DIAGNOSIS — Z23 Encounter for immunization: Secondary | ICD-10-CM

## 2014-11-14 DIAGNOSIS — C19 Malignant neoplasm of rectosigmoid junction: Secondary | ICD-10-CM | POA: Diagnosis present

## 2014-11-14 DIAGNOSIS — C772 Secondary and unspecified malignant neoplasm of intra-abdominal lymph nodes: Secondary | ICD-10-CM | POA: Diagnosis present

## 2014-11-14 DIAGNOSIS — E669 Obesity, unspecified: Secondary | ICD-10-CM | POA: Diagnosis present

## 2014-11-14 DIAGNOSIS — C187 Malignant neoplasm of sigmoid colon: Secondary | ICD-10-CM | POA: Diagnosis present

## 2014-11-14 LAB — TYPE AND SCREEN
ABO/RH(D): A POS
Antibody Screen: NEGATIVE

## 2014-11-14 LAB — GLUCOSE, CAPILLARY: Glucose-Capillary: 151 mg/dL — ABNORMAL HIGH (ref 70–99)

## 2014-11-14 SURGERY — COLECTOMY, PARTIAL, ROBOT-ASSISTED, LAPAROSCOPIC
Anesthesia: General | Site: Abdomen

## 2014-11-14 MED ORDER — DEXTROSE 5 % IV SOLN
2.0000 g | Freq: Two times a day (BID) | INTRAVENOUS | Status: AC
  Administered 2014-11-14: 2 g via INTRAVENOUS
  Filled 2014-11-14: qty 2

## 2014-11-14 MED ORDER — LACTATED RINGERS IV SOLN
INTRAVENOUS | Status: DC | PRN
  Administered 2014-11-14: 07:00:00 via INTRAVENOUS

## 2014-11-14 MED ORDER — LIDOCAINE HCL (CARDIAC) 20 MG/ML IV SOLN
INTRAVENOUS | Status: DC | PRN
  Administered 2014-11-14: 75 mg via INTRAVENOUS

## 2014-11-14 MED ORDER — ALVIMOPAN 12 MG PO CAPS
12.0000 mg | ORAL_CAPSULE | Freq: Once | ORAL | Status: AC
  Administered 2014-11-14: 12 mg via ORAL
  Filled 2014-11-14: qty 1

## 2014-11-14 MED ORDER — ACETAMINOPHEN 10 MG/ML IV SOLN
1000.0000 mg | Freq: Once | INTRAVENOUS | Status: AC
  Administered 2014-11-14: 1000 mg via INTRAVENOUS
  Filled 2014-11-14: qty 100

## 2014-11-14 MED ORDER — MIDAZOLAM HCL 2 MG/2ML IJ SOLN
INTRAMUSCULAR | Status: AC
  Filled 2014-11-14: qty 2

## 2014-11-14 MED ORDER — LIP MEDEX EX OINT
TOPICAL_OINTMENT | CUTANEOUS | Status: AC
  Administered 2014-11-14: 1
  Filled 2014-11-14: qty 7

## 2014-11-14 MED ORDER — CEFOTETAN DISODIUM-DEXTROSE 2-2.08 GM-% IV SOLR
INTRAVENOUS | Status: AC
  Filled 2014-11-14: qty 50

## 2014-11-14 MED ORDER — LACTATED RINGERS IV SOLN: INTRAVENOUS | Status: DC

## 2014-11-14 MED ORDER — PROPOFOL 10 MG/ML IV BOLUS
INTRAVENOUS | Status: DC | PRN
  Administered 2014-11-14: 100 mg via INTRAVENOUS

## 2014-11-14 MED ORDER — MIDAZOLAM HCL 5 MG/5ML IJ SOLN
INTRAMUSCULAR | Status: DC | PRN
  Administered 2014-11-14 (×2): 1 mg via INTRAVENOUS

## 2014-11-14 MED ORDER — ONDANSETRON HCL 4 MG/2ML IJ SOLN
4.0000 mg | Freq: Four times a day (QID) | INTRAMUSCULAR | Status: DC | PRN
  Administered 2014-11-17 – 2014-11-19 (×4): 4 mg via INTRAVENOUS
  Filled 2014-11-14 (×4): qty 2

## 2014-11-14 MED ORDER — BUPIVACAINE-EPINEPHRINE 0.25% -1:200000 IJ SOLN
INTRAMUSCULAR | Status: AC
  Filled 2014-11-14: qty 1

## 2014-11-14 MED ORDER — BUPIVACAINE-EPINEPHRINE 0.25% -1:200000 IJ SOLN
INTRAMUSCULAR | Status: DC | PRN
  Administered 2014-11-14: 20 mL

## 2014-11-14 MED ORDER — CEFAZOLIN SODIUM-DEXTROSE 2-3 GM-% IV SOLR
INTRAVENOUS | Status: AC
  Filled 2014-11-14: qty 50

## 2014-11-14 MED ORDER — PROPOFOL 10 MG/ML IV BOLUS
INTRAVENOUS | Status: AC
  Filled 2014-11-14: qty 20

## 2014-11-14 MED ORDER — RINGERS IRRIGATION IR SOLN
Status: DC | PRN
  Administered 2014-11-14: 1000 mL

## 2014-11-14 MED ORDER — DIPHENHYDRAMINE HCL 50 MG/ML IJ SOLN: 12.5000 mg | Freq: Four times a day (QID) | INTRAMUSCULAR | Status: DC | PRN

## 2014-11-14 MED ORDER — DIPHENHYDRAMINE HCL 12.5 MG/5ML PO ELIX: 12.5000 mg | ORAL_SOLUTION | Freq: Four times a day (QID) | ORAL | Status: DC | PRN

## 2014-11-14 MED ORDER — LIDOCAINE HCL (CARDIAC) 20 MG/ML IV SOLN
INTRAVENOUS | Status: AC
Start: 2014-11-14 — End: 2014-11-14
  Filled 2014-11-14: qty 5

## 2014-11-14 MED ORDER — ATROPINE SULFATE 0.4 MG/ML IJ SOLN
INTRAMUSCULAR | Status: AC
  Filled 2014-11-14: qty 1

## 2014-11-14 MED ORDER — DEXTROSE 5 % IV SOLN
2.0000 g | INTRAVENOUS | Status: AC
  Administered 2014-11-14: 2 g via INTRAVENOUS
  Filled 2014-11-14: qty 2

## 2014-11-14 MED ORDER — CISATRACURIUM BESYLATE 20 MG/10ML IV SOLN
INTRAVENOUS | Status: AC
  Filled 2014-11-14: qty 10

## 2014-11-14 MED ORDER — FENTANYL CITRATE 0.05 MG/ML IJ SOLN
INTRAMUSCULAR | Status: AC
  Filled 2014-11-14: qty 5

## 2014-11-14 MED ORDER — 0.9 % SODIUM CHLORIDE (POUR BTL) OPTIME
TOPICAL | Status: DC | PRN
  Administered 2014-11-14: 2000 mL

## 2014-11-14 MED ORDER — NEOSTIGMINE METHYLSULFATE 10 MG/10ML IV SOLN
INTRAVENOUS | Status: AC
  Filled 2014-11-14: qty 1

## 2014-11-14 MED ORDER — CISATRACURIUM BESYLATE (PF) 10 MG/5ML IV SOLN
INTRAVENOUS | Status: DC | PRN
  Administered 2014-11-14: 6 mg via INTRAVENOUS
  Administered 2014-11-14 (×2): 2 mg via INTRAVENOUS
  Administered 2014-11-14: 6 mg via INTRAVENOUS
  Administered 2014-11-14 (×3): 2 mg via INTRAVENOUS

## 2014-11-14 MED ORDER — ALVIMOPAN 12 MG PO CAPS
12.0000 mg | ORAL_CAPSULE | Freq: Two times a day (BID) | ORAL | Status: DC
  Administered 2014-11-15 – 2014-11-16 (×4): 12 mg via ORAL
  Filled 2014-11-14 (×6): qty 1

## 2014-11-14 MED ORDER — ONDANSETRON HCL 4 MG/2ML IJ SOLN
INTRAMUSCULAR | Status: AC
  Filled 2014-11-14: qty 2

## 2014-11-14 MED ORDER — LACTATED RINGERS IV SOLN
INTRAVENOUS | Status: DC | PRN
  Administered 2014-11-14 (×3): via INTRAVENOUS

## 2014-11-14 MED ORDER — HYDROMORPHONE HCL 1 MG/ML IJ SOLN
INTRAMUSCULAR | Status: AC
Start: 2014-11-14 — End: 2014-11-15
  Filled 2014-11-14: qty 1

## 2014-11-14 MED ORDER — INSULIN ASPART 100 UNIT/ML ~~LOC~~ SOLN: 0.0000 [IU] | Freq: Every day | SUBCUTANEOUS | Status: DC

## 2014-11-14 MED ORDER — ONDANSETRON HCL 4 MG PO TABS
4.0000 mg | ORAL_TABLET | Freq: Four times a day (QID) | ORAL | Status: DC | PRN
  Filled 2014-11-14: qty 1

## 2014-11-14 MED ORDER — ALUM & MAG HYDROXIDE-SIMETH 200-200-20 MG/5ML PO SUSP: 30.0000 mL | Freq: Four times a day (QID) | ORAL | Status: DC | PRN

## 2014-11-14 MED ORDER — OXYCODONE-ACETAMINOPHEN 5-325 MG PO TABS
1.0000 | ORAL_TABLET | ORAL | Status: DC | PRN
  Administered 2014-11-15 (×3): 1 via ORAL
  Administered 2014-11-16 (×2): 2 via ORAL
  Administered 2014-11-16: 1 via ORAL
  Administered 2014-11-16 (×2): 2 via ORAL
  Administered 2014-11-16: 1 via ORAL
  Administered 2014-11-17 – 2014-11-20 (×10): 2 via ORAL
  Filled 2014-11-14 (×13): qty 2
  Filled 2014-11-14: qty 1
  Filled 2014-11-14: qty 2
  Filled 2014-11-14: qty 1
  Filled 2014-11-14 (×2): qty 2
  Filled 2014-11-14: qty 1
  Filled 2014-11-14 (×3): qty 2

## 2014-11-14 MED ORDER — GLYCOPYRROLATE 0.2 MG/ML IJ SOLN
INTRAMUSCULAR | Status: AC
  Filled 2014-11-14: qty 3

## 2014-11-14 MED ORDER — ONDANSETRON HCL 4 MG/2ML IJ SOLN
INTRAMUSCULAR | Status: DC | PRN
  Administered 2014-11-14 (×2): 2 mg via INTRAVENOUS
  Administered 2014-11-14: 4 mg via INTRAVENOUS

## 2014-11-14 MED ORDER — DEXAMETHASONE SODIUM PHOSPHATE 10 MG/ML IJ SOLN
INTRAMUSCULAR | Status: DC | PRN
  Administered 2014-11-14: 10 mg via INTRAVENOUS

## 2014-11-14 MED ORDER — SUCCINYLCHOLINE CHLORIDE 20 MG/ML IJ SOLN
INTRAMUSCULAR | Status: DC | PRN
  Administered 2014-11-14: 120 mg via INTRAVENOUS

## 2014-11-14 MED ORDER — DEXAMETHASONE SODIUM PHOSPHATE 10 MG/ML IJ SOLN
INTRAMUSCULAR | Status: AC
  Filled 2014-11-14: qty 1

## 2014-11-14 MED ORDER — ENOXAPARIN SODIUM 40 MG/0.4ML ~~LOC~~ SOLN
40.0000 mg | SUBCUTANEOUS | Status: DC
  Administered 2014-11-15 – 2014-11-20 (×6): 40 mg via SUBCUTANEOUS
  Filled 2014-11-14 (×9): qty 0.4

## 2014-11-14 MED ORDER — ACETAMINOPHEN 500 MG PO TABS
1000.0000 mg | ORAL_TABLET | Freq: Four times a day (QID) | ORAL | Status: AC
  Administered 2014-11-14 – 2014-11-15 (×4): 1000 mg via ORAL
  Filled 2014-11-14 (×4): qty 2

## 2014-11-14 MED ORDER — LACTATED RINGERS IV SOLN
INTRAVENOUS | Status: DC
  Administered 2014-11-14: 14:00:00 via INTRAVENOUS

## 2014-11-14 MED ORDER — GLYCOPYRROLATE 0.2 MG/ML IJ SOLN
INTRAMUSCULAR | Status: DC | PRN
  Administered 2014-11-14: 0.2 mg via INTRAVENOUS
  Administered 2014-11-14: 0.6 mg via INTRAVENOUS

## 2014-11-14 MED ORDER — EPHEDRINE SULFATE 50 MG/ML IJ SOLN
INTRAMUSCULAR | Status: AC
  Filled 2014-11-14: qty 1

## 2014-11-14 MED ORDER — HYDROMORPHONE HCL 1 MG/ML IJ SOLN
0.2500 mg | INTRAMUSCULAR | Status: DC | PRN
  Administered 2014-11-14: 0.25 mg via INTRAVENOUS

## 2014-11-14 MED ORDER — NEOSTIGMINE METHYLSULFATE 10 MG/10ML IV SOLN
INTRAVENOUS | Status: DC | PRN
  Administered 2014-11-14: 2 mg via INTRAVENOUS

## 2014-11-14 MED ORDER — MORPHINE SULFATE 2 MG/ML IJ SOLN
2.0000 mg | INTRAMUSCULAR | Status: DC | PRN
  Administered 2014-11-14 (×3): 2 mg via INTRAVENOUS
  Administered 2014-11-15: 4 mg via INTRAVENOUS
  Administered 2014-11-15: 2 mg via INTRAVENOUS
  Administered 2014-11-15: 4 mg via INTRAVENOUS
  Administered 2014-11-15: 2 mg via INTRAVENOUS
  Administered 2014-11-17 – 2014-11-18 (×7): 4 mg via INTRAVENOUS
  Administered 2014-11-18 (×2): 2 mg via INTRAVENOUS
  Administered 2014-11-18 (×2): 4 mg via INTRAVENOUS
  Filled 2014-11-14: qty 1
  Filled 2014-11-14 (×3): qty 2
  Filled 2014-11-14 (×3): qty 1
  Filled 2014-11-14: qty 2
  Filled 2014-11-14: qty 1
  Filled 2014-11-14 (×2): qty 2
  Filled 2014-11-14: qty 1
  Filled 2014-11-14: qty 2
  Filled 2014-11-14: qty 1
  Filled 2014-11-14: qty 2
  Filled 2014-11-14: qty 1
  Filled 2014-11-14 (×4): qty 2

## 2014-11-14 MED ORDER — INFLUENZA VAC SPLIT QUAD 0.5 ML IM SUSY
0.5000 mL | PREFILLED_SYRINGE | INTRAMUSCULAR | Status: DC
  Filled 2014-11-14 (×2): qty 0.5

## 2014-11-14 MED ORDER — SODIUM CHLORIDE 0.9 % IJ SOLN
INTRAMUSCULAR | Status: AC
  Filled 2014-11-14: qty 10

## 2014-11-14 MED ORDER — FENTANYL CITRATE 0.05 MG/ML IJ SOLN
INTRAMUSCULAR | Status: DC | PRN
  Administered 2014-11-14: 50 ug via INTRAVENOUS
  Administered 2014-11-14: 100 ug via INTRAVENOUS
  Administered 2014-11-14 (×7): 50 ug via INTRAVENOUS

## 2014-11-14 MED ORDER — HEPARIN SODIUM (PORCINE) 5000 UNIT/ML IJ SOLN
5000.0000 [IU] | Freq: Once | INTRAMUSCULAR | Status: AC
  Administered 2014-11-14: 5000 [IU] via SUBCUTANEOUS
  Filled 2014-11-14: qty 1

## 2014-11-14 MED ORDER — EPHEDRINE SULFATE 50 MG/ML IJ SOLN
INTRAMUSCULAR | Status: DC | PRN
  Administered 2014-11-14: 5 mg via INTRAVENOUS

## 2014-11-14 SURGICAL SUPPLY — 93 items
BLADE EXTENDED COATED 6.5IN (ELECTRODE) ×2 IMPLANT
BLADE SURG SZ11 CARB STEEL (BLADE) ×2 IMPLANT
CANNULA REDUC XI 12-8 STAPL (CANNULA) ×1
CANNULA REDUCER 12-8 DVNC XI (CANNULA) ×1 IMPLANT
CELLS DAT CNTRL 66122 CELL SVR (MISCELLANEOUS) IMPLANT
CHLORAPREP W/TINT 26ML (MISCELLANEOUS) ×2 IMPLANT
CLIP LIGATING HEM O LOK PURPLE (MISCELLANEOUS) IMPLANT
CLIP LIGATING HEMOLOK MED (MISCELLANEOUS) IMPLANT
COVER MAYO STAND STRL (DRAPES) ×2 IMPLANT
COVER TIP SHEARS 8 DVNC (MISCELLANEOUS) ×1 IMPLANT
COVER TIP SHEARS 8MM DA VINCI (MISCELLANEOUS) ×1
DECANTER SPIKE VIAL GLASS SM (MISCELLANEOUS) ×2 IMPLANT
DEVICE PMI PUNCTURE CLOSURE (MISCELLANEOUS) ×2 IMPLANT
DEVICE TROCAR PUNCTURE CLOSURE (ENDOMECHANICALS) IMPLANT
DRAPE ARM DVNC X/XI (DISPOSABLE) ×4 IMPLANT
DRAPE COLUMN DVNC XI (DISPOSABLE) ×1 IMPLANT
DRAPE DA VINCI XI ARM (DISPOSABLE) ×4
DRAPE DA VINCI XI COLUMN (DISPOSABLE) ×1
DRAPE SHEET LG 3/4 BI-LAMINATE (DRAPES) ×2 IMPLANT
DRAPE SURG IRRIG POUCH 19X23 (DRAPES) ×2 IMPLANT
DRAPE WARM FLUID 44X44 (DRAPE) ×2 IMPLANT
DRSG OPSITE POSTOP 4X10 (GAUZE/BANDAGES/DRESSINGS) IMPLANT
DRSG OPSITE POSTOP 4X6 (GAUZE/BANDAGES/DRESSINGS) ×1 IMPLANT
DRSG OPSITE POSTOP 4X8 (GAUZE/BANDAGES/DRESSINGS) IMPLANT
ELECT PENCIL ROCKER SW 15FT (MISCELLANEOUS) ×4 IMPLANT
ELECT REM PT RETURN 15FT ADLT (MISCELLANEOUS) ×2 IMPLANT
ENDOLOOP SUT PDS II  0 18 (SUTURE)
ENDOLOOP SUT PDS II 0 18 (SUTURE) IMPLANT
EVACUATOR SILICONE 100CC (DRAIN) IMPLANT
GAUZE SPONGE 4X4 12PLY STRL (GAUZE/BANDAGES/DRESSINGS) IMPLANT
GLOVE BIO SURGEON STRL SZ 6.5 (GLOVE) ×6 IMPLANT
GLOVE BIOGEL PI IND STRL 7.0 (GLOVE) ×3 IMPLANT
GLOVE BIOGEL PI INDICATOR 7.0 (GLOVE) ×3
GOWN STRL REUS W/TWL 2XL LVL3 (GOWN DISPOSABLE) ×6 IMPLANT
GOWN STRL REUS W/TWL XL LVL3 (GOWN DISPOSABLE) ×8 IMPLANT
HOLDER FOLEY CATH W/STRAP (MISCELLANEOUS) ×2 IMPLANT
KIT BASIN OR (CUSTOM PROCEDURE TRAY) ×4 IMPLANT
LEGGING LITHOTOMY PAIR STRL (DRAPES) ×2 IMPLANT
NDL INSUFFLATION 14GA 120MM (NEEDLE) ×1 IMPLANT
NEEDLE HYPO 22GX1.5 SAFETY (NEEDLE) ×2 IMPLANT
NEEDLE INSUFFLATION 14GA 120MM (NEEDLE) ×2 IMPLANT
PACK CARDIOVASCULAR III (CUSTOM PROCEDURE TRAY) ×2 IMPLANT
PACK GENERAL/GYN (CUSTOM PROCEDURE TRAY) ×2 IMPLANT
PORT LAP GEL ALEXIS MED 5-9CM (MISCELLANEOUS) IMPLANT
RELOAD STAPLE 45 BLU REG DVNC (STAPLE) IMPLANT
RELOAD STAPLE 45 GRN THCK DVNC (STAPLE) IMPLANT
RETRACTOR WND ALEXIS 18 MED (MISCELLANEOUS) IMPLANT
RTRCTR WOUND ALEXIS 18CM MED (MISCELLANEOUS)
SCISSORS LAP 5X45 EPIX DISP (ENDOMECHANICALS) ×2 IMPLANT
SEAL CANN UNIV 5-8 DVNC XI (MISCELLANEOUS) ×3 IMPLANT
SEAL XI 5MM-8MM UNIVERSAL (MISCELLANEOUS) ×3
SEALER VESSEL DA VINCI XI (MISCELLANEOUS) ×1
SEALER VESSEL EXT DVNC XI (MISCELLANEOUS) ×1 IMPLANT
SET IRRIG TUBING LAPAROSCOPIC (IRRIGATION / IRRIGATOR) ×2 IMPLANT
SLEEVE XCEL OPT CAN 5 100 (ENDOMECHANICALS) IMPLANT
SOLUTION ELECTROLUBE (MISCELLANEOUS) ×2 IMPLANT
SPONGE LAP 18X18 X RAY DECT (DISPOSABLE) IMPLANT
STAPLER 45 BLU RELOAD XI (STAPLE) ×2 IMPLANT
STAPLER 45 BLUE RELOAD XI (STAPLE) ×2
STAPLER 45 GREEN RELOAD XI (STAPLE)
STAPLER 45 GRN RELOAD XI (STAPLE) IMPLANT
STAPLER CANNULA SEAL DVNC XI (STAPLE) IMPLANT
STAPLER CANNULA SEAL XI (STAPLE)
STAPLER CIRC ILS CVD 33MM 37CM (STAPLE) ×1 IMPLANT
STAPLER SHEATH (SHEATH)
STAPLER SHEATH ENDOWRIST DVNC (SHEATH) IMPLANT
STAPLER VISISTAT 35W (STAPLE) ×2 IMPLANT
SUCTION POOLE TIP (SUCTIONS) ×2 IMPLANT
SUT PDS AB 1 CTX 36 (SUTURE) IMPLANT
SUT PDS AB 1 TP1 96 (SUTURE) IMPLANT
SUT PROLENE 2 0 KS (SUTURE) ×2 IMPLANT
SUT SILK 2 0 (SUTURE) ×2
SUT SILK 2 0 SH CR/8 (SUTURE) ×2 IMPLANT
SUT SILK 2-0 18XBRD TIE 12 (SUTURE) ×1 IMPLANT
SUT SILK 3 0 (SUTURE) ×2
SUT SILK 3 0 SH CR/8 (SUTURE) ×2 IMPLANT
SUT SILK 3-0 18XBRD TIE 12 (SUTURE) ×1 IMPLANT
SUT VIC AB 2-0 SH 18 (SUTURE) IMPLANT
SUT VIC AB 2-0 SH 27 (SUTURE)
SUT VIC AB 2-0 SH 27X BRD (SUTURE) IMPLANT
SUT VIC AB 4-0 PS2 27 (SUTURE) ×4 IMPLANT
SUT VICRYL 0 UR6 27IN ABS (SUTURE) ×1 IMPLANT
SYR 20CC LL (SYRINGE) ×2 IMPLANT
SYRINGE 10CC LL (SYRINGE) ×2 IMPLANT
SYS LAPSCP GELPORT 120MM (MISCELLANEOUS)
SYSTEM LAPSCP GELPORT 120MM (MISCELLANEOUS) IMPLANT
TOWEL OR 17X26 10 PK STRL BLUE (TOWEL DISPOSABLE) ×4 IMPLANT
TOWEL OR NON WOVEN STRL DISP B (DISPOSABLE) ×2 IMPLANT
TRAY FOLEY CATH 14FRSI W/METER (CATHETERS) ×2 IMPLANT
TROCAR BLADELESS OPT 5 100 (ENDOMECHANICALS) ×2 IMPLANT
TUBING CONNECTING 10 (TUBING) IMPLANT
TUBING FILTER THERMOFLATOR (ELECTROSURGICAL) ×2 IMPLANT
YANKAUER SUCT BULB TIP 10FT TU (MISCELLANEOUS) ×2 IMPLANT

## 2014-11-14 NOTE — Anesthesia Procedure Notes (Signed)
Procedure Name: Intubation Date/Time: 11/14/2014 7:51 AM Performed by: Ofilia Neas Pre-anesthesia Checklist: Patient identified, Timeout performed, Emergency Drugs available, Suction available and Patient being monitored Patient Re-evaluated:Patient Re-evaluated prior to inductionOxygen Delivery Method: Circle system utilized Preoxygenation: Pre-oxygenation with 100% oxygen Intubation Type: IV induction and Cricoid Pressure applied Ventilation: Mask ventilation without difficulty Laryngoscope Size: Mac and 4 Grade View: Grade III Tube type: Oral Tube size: 7.5 mm Number of attempts: 1 Airway Equipment and Method: Stylet Placement Confirmation: ETT inserted through vocal cords under direct vision,  positive ETCO2 and breath sounds checked- equal and bilateral Secured at: 21 cm Tube secured with: Tape Dental Injury: Teeth and Oropharynx as per pre-operative assessment  Difficulty Due To: Difficulty was anticipated, Difficult Airway- due to large tongue, Difficult Airway- due to reduced neck mobility, Difficult Airway- due to limited oral opening and Difficult Airway- due to anterior larynx Future Recommendations: Recommend- induction with short-acting agent, and alternative techniques readily available Comments: Minimal view of posterior cords with head lift and cricoid pressure.

## 2014-11-14 NOTE — Anesthesia Postprocedure Evaluation (Signed)
  Anesthesia Post-op Note  Patient: Jenny [name]  Procedure(s) Performed: Procedure(s) (LRB): XI ROBOT ASSISTED LAPAROSCOPIC PARTIAL COLECTOMY (N/A)  Patient Location: PACU  Anesthesia Type: General  Level of Consciousness: awake and alert   Airway and Oxygen Therapy: Patient Spontanous Breathing  Post-op Pain: mild  Post-op Assessment: Post-op Vital signs reviewed, Patient's Cardiovascular Status Stable, Respiratory Function Stable, Patent Airway and No signs of Nausea or vomiting  Last Vitals:  Filed Vitals:   11/14/14 1309  BP: 113/74  Pulse: 75  Temp: 36.6 C  Resp: 18    Post-op Vital Signs: stable   Complications: No apparent anesthesia complications

## 2014-11-14 NOTE — H&P (Signed)
History of Present Illness Jenny Ruff MD; 62/26/3335 4:09 PM) Patient words: NP--colon cancer.  The patient is a 53 year old female who presents with colorectal cancer. The patient is being seen for initial consultation for cancer of the sigmoid colon.. The patient was referred by a gastroenterologist (Dr Collene Mares). Initial presentation was a few month(s) ago for hematochezia.. Current diagnosis was determined by colonoscopy. Tumor markers include carcinoembryonic antigen level of <0.5. CT scan of the abdomen and pelvis shows sigmoid colon eccentric wall thickening. There no signs of metastatic disease. Symptoms do not include abdominal pain, constipation, nausea, vomiting or weight loss.. Onset was Onset was gradual.   Other Problems Jenny [name]; 10/17/2014 2:26 PM) Colon Cancer  Past Surgical History Jenny [name]; 10/17/2014 2:26 PM) Cesarean Section - 1 Colon Polyp Removal - Colonoscopy Spinal Surgery - Neck Tonsillectomy  Diagnostic Studies History Jenny [name]; 10/17/2014 2:26 PM) Colonoscopy within last year Pap Smear >5 years ago  Allergies Jenny [name]; 10/17/2014 2:27 PM) No Known Drug Allergies10/26/2015  Medication History Jenny [name]; 10/17/2014 2:28 PM) Gabapentin (100MG  Capsule, Oral) Active. Tylenol Extra Strength (500MG  Tablet, Oral) Active. MiraLax (Oral) Active. Ferrous Sulfate CR (325MG  Tablet ER, Oral) Active.  Social History Jenny [name]; 10/17/2014 2:26 PM) Caffeine use Carbonated beverages, Coffee, Tea. No alcohol use No drug use Tobacco use Never smoker.  Family History Jenny [name]; 10/17/2014 2:26 PM) Cancer Brother. Heart Disease Mother. Hypertension Mother. Thyroid problems Mother.  Pregnancy / Birth History Jenny [name]; 10/17/2014 2:26 PM) Age at menarche 19 years. Gravida 3 Irregular periods Maternal age 2-20 Para 3  Review of Systems Jenny [name]; 10/17/2014 2:26  PM) General Present- Chills and Fatigue. Not Present- Appetite Loss, Fever, Night Sweats, Weight Gain and Weight Loss. Skin Not Present- Change in Wart/Mole, Dryness, Hives, Jaundice, New Lesions, Non-Healing Wounds, Rash and Ulcer. HEENT Not Present- Earache, Hearing Loss, Hoarseness, Nose Bleed, Oral Ulcers, Ringing in the Ears, Seasonal Allergies, Sinus Pain, Sore Throat, Visual Disturbances, Wears glasses/contact lenses and Yellow Eyes. Respiratory Present- Snoring. Not Present- Bloody sputum, Chronic Cough, Difficulty Breathing and Wheezing. Breast Not Present- Breast Mass, Breast Pain, Nipple Discharge and Skin Changes. Cardiovascular Not Present- Chest Pain, Difficulty Breathing Lying Down, Leg Cramps, Palpitations, Rapid Heart Rate, Shortness of Breath and Swelling of Extremities. Gastrointestinal Present- Abdominal Pain and Bloody Stool. Not Present- Bloating, Change in Bowel Habits, Chronic diarrhea, Constipation, Difficulty Swallowing, Excessive gas, Gets full quickly at meals, Hemorrhoids, Indigestion, Nausea, Rectal Pain and Vomiting. Female Genitourinary Not Present- Frequency, Nocturia, Painful Urination, Pelvic Pain and Urgency. Musculoskeletal Not Present- Back Pain, Joint Pain, Joint Stiffness, Muscle Pain, Muscle Weakness and Swelling of Extremities. Neurological Present- Tingling. Not Present- Decreased Memory, Fainting, Headaches, Numbness, Seizures, Tremor, Trouble walking and Weakness. Psychiatric Present- Change in Sleep Pattern. Not Present- Anxiety, Bipolar, Depression, Fearful and Frequent crying. Endocrine Not Present- Cold Intolerance, Excessive Hunger, Hair Changes, Heat Intolerance, Hot flashes and New Diabetes. Hematology Not Present- Easy Bruising, Excessive bleeding, Gland problems, HIV and Persistent Infections.   BP 124/84 mmHg  Pulse 79  Temp(Src) 98.3 F (36.8 C) (Oral)  Resp 18  Ht 5\' 4"  (1.626 m)  Wt 260 lb (117.935 kg)  BMI 44.61 kg/m2  SpO2  100%  Physical Exam Jenny Ruff MD; 45/62/5638 4:11 PM) General Mental Status-Alert. General Appearance-Not in acute distress, Not Sickly. Orientation-Oriented X3. Hydration-Well hydrated. Voice-Normal.  Integumentary Global Assessment Upon inspection and palpation of skin surfaces of the - Axillae: non-tender, no inflammation or ulceration, no drainage. and Distribution of scalp and  body hair is normal. General Characteristics Temperature - normal warmth is noted.  Head and Neck Head-normocephalic, atraumatic with no lesions or palpable masses. Face Global Assessment - atraumatic, no absence of expression. Neck Global Assessment - no abnormal movements, no bruit auscultated on the right, no bruit auscultated on the left, no decreased range of motion, non-tender. Trachea-midline. Thyroid Gland Characteristics - non-tender.  ENMT Ears Pinna - Left - no drainage observed, no generalized tenderness observed. Right - no drainage observed, no generalized tenderness observed. Nose and Sinuses External Inspection of the Nose - no destructive lesion observed. Inspection of the nares - Left - quiet respiration. Right - quiet respiration. Mouth and Throat Lips - Upper Lip - no fissures observed, no pallor noted. Lower Lip - no fissures observed, no pallor noted. Nasopharynx - no discharge present. Oral Cavity/Oropharynx - Tongue - no dryness observed. Oral Mucosa - no cyanosis observed. Hypopharynx - no evidence of airway distress observed.  Chest and Lung Exam Inspection Movements - Normal and Symmetrical. Accessory muscles - No use of accessory muscles in breathing. Palpation Palpation of the chest reveals - Non-tender. Auscultation Breath sounds - Normal and Clear.  Cardiovascular Auscultation Rhythm - Regular. Murmurs & Other Heart Sounds - Auscultation of the heart reveals - No Murmurs and No Systolic Clicks.  Abdomen Inspection Inspection of the abdomen  reveals - No Visible peristalsis and No Abnormal pulsations. Note: lower midline scar. Palpation/Percussion Palpation and Percussion of the abdomen reveal - Soft, Non Tender, No Rebound tenderness, No Rigidity (guarding) and No Cutaneous hyperesthesia.  Peripheral Vascular Upper Extremity Inspection - Left - No Cyanotic nailbeds, Not Ischemic. Right - No Cyanotic nailbeds, Not Ischemic.  Neurologic Neurologic evaluation reveals -normal attention span and ability to concentrate, able to name objects and repeat phrases. Appropriate fund of knowledge , normal sensation and normal coordination. Mental Status Affect - not angry, not paranoid. Cranial Nerves-Normal Bilaterally. Gait-Normal.  Neuropsychiatric Mental status exam performed with findings of-able to articulate well with normal speech/language, rate, volume and coherence, thought content normal with ability to perform basic computations and apply abstract reasoning and no evidence of hallucinations, delusions, obsessions or homicidal/suicidal ideation.  Musculoskeletal Global Assessment Spine, Ribs and Pelvis - no instability, subluxation or laxity. Right Upper Extremity - no instability, subluxation or laxity.    Assessment & Plan Jenny Ruff MD; 46/56/8127 4:16 PM) LOCALIZED CANCER OF COLON (153.9  C18.9) Story: This is a 53 year old female who was found to have a sigmoid colon mass on diagnostic colonoscopy. Biopsy revealed invasive adenocarcinoma, moderately differentiated. CT scan of the abdomen and pelvis show no signs of metastatic disease. CEA level was normal. Impression: Her physical exam is benign. I have recommended surgical excision of her sigmoid colon. we discussed robotic and laparoscopic techniques. I will send her back to Dr. Collene Mares for tattooing of the lesion. She also will get a chest CT to complete her metastatic workup.  The surgery and anatomy were described to the patient as well as the risks of  surgery and the possible complications. These include: Bleeding, deep abdominal infections and possible wound complications such as hernia and infection, damage to adjacent structures, leak of surgical connections, which can lead to other surgeries and possibly an ostomy, possible need for other procedures, such as abscess drains in radiology, possible prolonged hospital stay, possible diarrhea from removal of part of the colon, possible constipation from narcotics, possible bowel, bladder or sexual dysfunction if having rectal surgery, prolonged fatigue/weakness or appetite loss, possible early recurrence  of of disease, possible complications of their medical problems such as heart disease or arrhythmias or lung problems, death (less than 1%). I believe the patient understands and wishes to proceed with the surgery.

## 2014-11-14 NOTE — Progress Notes (Signed)
Telephone order from MD Marcello Moores for patient to have LR Infusion at 100 ml/hr Neta Mends RN 11-14-2014 1:37 PM

## 2014-11-14 NOTE — Op Note (Signed)
11/14/2014  11:52 AM  PATIENT:  Jenny [name]  53 y.o. female  Patient Care Team: Lind Covert, MD as PCP - General  PRE-OPERATIVE DIAGNOSIS:  colon cancer  POST-OPERATIVE DIAGNOSIS:  COLON CANCER  PROCEDURE:  XI ROBOT ASSISTED SIGMOID COLECTOMY   Surgeon(s): [name], MD Alphonsa Overall, MD  ASSISTANT: Lucia Gaskins   ANESTHESIA:   local and general  EBL:  Total I/O In: 1500 [I.V.:1500] Out: 43 [Urine:650; Blood:25]  Delay start of Pharmacological VTE agent (>24hrs) due to surgical blood loss or risk of bleeding:  no  DRAINS: none   SPECIMEN:  Source of Specimen:  Sigmoid colon  DISPOSITION OF SPECIMEN:  PATHOLOGY  COUNTS:  YES  PLAN OF CARE: Admit to inpatient   PATIENT DISPOSITION:  PACU - hemodynamically stable.  INDICATION:    This is a 53 y.o. F with a sigmoid colon cancer found on colonoscopy.  I recommended segmental resection:  The anatomy & physiology of the digestive tract was discussed.  The pathophysiology was discussed.  Natural history risks without surgery was discussed.   I worked to give an overview of the disease and the frequent need to have multispecialty involvement.  I feel the risks of no intervention will lead to serious problems that outweigh the operative risks; therefore, I recommended a partial colectomy to remove the pathology.  Laparoscopic & open techniques were discussed.   Risks such as bleeding, infection, abscess, leak, reoperation, possible ostomy, hernia, heart attack, death, and other risks were discussed.  I noted a good likelihood this will help address the problem.   Goals of post-operative recovery were discussed as well.    The patient expressed understanding & wished to proceed with surgery.  OR FINDINGS:   Patient had a easily identifiable tattoo mark in her distal sigmoid colon.    No obvious metastatic disease on visceral parietal peritoneum or liver.  The anastomosis rests ~15cm cm from the anal verge by  rigid proctoscopy.  DESCRIPTION:   Informed consent was confirmed.  The patient underwent general anaesthesia without difficulty.  The patient was positioned appropriately.  VTE prevention in place.  The patient's abdomen was clipped, prepped, & draped in a sterile fashion.  Surgical timeout confirmed our plan.  The patient was positioned in reverse Trendelenburg.  Abdominal entry was gained using a Veress needle.  Entry was clean.  I induced carbon dioxide insufflation.  Camera inspection revealed no injury.  Extra ports were carefully placed under direct laparoscopic visualization.  A 21mm port was placed in the RUQ.  I evaluated the patient's abdomen. There was no obvious signs of metastatic disease. There was a portion of the omentum and sigmoid colon that was adherent to the abdominal wall. This was bluntly taken down using the robotic camera and laparoscopic instruments. I was not able to get all of this taken down laparoscopically. I then docked the robot to the patient's left side and placed instruments under direct visualization. I took down the remaining portion of the omentum and sigmoid colon using sharp dissection.       I reflected the greater omentum and the upper abdomen the small bowel in the upper abdomen. I scored the base of peritoneum of the right side of the mesentery of the left colon from the ligament of Treitz to the peritoneal reflection of the mid rectum.  I elevated the sigmoid mesentery and enetered into the retro-mesenteric plane. We were able to identify the left ureter and gonadal vessels. We kept those  posterior within the retroperitoneum and elevated the left colon mesentery off that. I did isolated IMA pedicle but did not ligate it yet.  I continued distally and got into the avascular plane posterior to the mesorectum. This allowed me to help mobilize the rectum as well by freeing the mesorectum off the sacrum.  I mobilized the peritoneal coverings towards the peritoneal  reflection on both the right and left sides of the rectum.  I could see the right and left ureters and stayed away from them.    I skeletonized the inferior mesenteric artery pedicle.  I went down to its takeoff from the aorta. After confirming the left ureter was out of the way, I went ahead and ligated the inferior mesenteric artery pedicle with robotic vessel sealer  ~2cm above its takeoff from the aorta. I did ligate sigmoid vein in a similar fashion.  We ensured hemostasis.  I then mobilized the colon off of the lateral sidewall using the vessel sealer. I mobilized this down to the level of the proximal rectum. This was adherent to the uterus and ovaries and was taken down using sharp dissection. After this was completed I identified a portion in the proximal rectum that I felt was just distal to the rectal sigmoid junction.  I skeletonized the mesorectum at the junction using blunt dissection and the vessel sealer .  I mobilized the left colon in a lateral to medial fashion off the line of Toldt up towards the splenic flexure to ensure good mobilization of the left colon to reach into the pelvis.  After this was completed, I divided the colon mesentery up to the level of the descending colon. I skeletonized a portion of descending colon easily reached into the pelvis. After this was completed I made a small incision in the patient's previous lower midline incision using a scalpel. This was carried down through subcutaneous tissues using electrocautery. The fascia was divided at midline. The peritoneum was entered bluntly. An Hooper wound protector was placed. The sigmoid colon was brought out through the abdominal wall and transected at the level that was previously mobilized. This was in the pathology for further examination. The margins appeared very wide. A pursestring device was placed on the descending colon. A 2-0 Prolene pursestring was placed. This was secured into place with 3-0 silk sutures.  A  33 mm EEA anvil was inserted and the pursestring was tied tightly around the anvil. This was then placed back into the abdomen. The cap was placed on the wound protector and the abdomen was reinsufflated. A 33 mm EEA was placed into the patient's rectum and brought out through the rectal stump. An anastomosis was created without tension. There was no when insufflated under water. The abdomen was irrigated. Hemostasis was good. The omentum was then brought down over the anastomosis. After this we switched to clean instruments, gowns and gloves and drapes. The 12 mm port was closed using a 0 Vicryl and the Endo Close device. The fascia of the extraction site was closed using interrupted 0 Novafil sutures. The subcutaneous tissue was closed using interrupted 2-0 Vicryl sutures. The skin of all incisions were closed using 4-0 Vicryl sutures. A sterile dressing was placed on lower midline wound. Dermabond was placed on the port sites. The patient was awakened from anesthesia and sent to the postanesthesia care unit in stable condition. All counts were correct per operating room staff.

## 2014-11-14 NOTE — Transfer of Care (Signed)
Immediate Anesthesia Transfer of Care Note  Patient: Jenny [name]  Procedure(s) Performed: Procedure(s): XI ROBOT ASSISTED LAPAROSCOPIC PARTIAL COLECTOMY (N/A)  Patient Location: PACU  Anesthesia Type:General  Level of Consciousness: awake, oriented, patient cooperative, lethargic and responds to stimulation  Airway & Oxygen Therapy: Patient Spontanous Breathing and Patient connected to face mask oxygen  Post-op Assessment: Report given to PACU RN, Post -op Vital signs reviewed and stable and Patient moving all extremities  Post vital signs: Reviewed and stable  Complications: No apparent anesthesia complications

## 2014-11-15 LAB — GLUCOSE, CAPILLARY
Glucose-Capillary: 146 mg/dL — ABNORMAL HIGH (ref 70–99)
Glucose-Capillary: 145 mg/dL — ABNORMAL HIGH (ref 70–99)
Glucose-Capillary: 126 mg/dL — ABNORMAL HIGH (ref 70–99)
Glucose-Capillary: 146 mg/dL — ABNORMAL HIGH (ref 70–99)

## 2014-11-15 LAB — BASIC METABOLIC PANEL
Anion gap: 14 (ref 5–15)
BUN: 10 mg/dL (ref 6–23)
CO2: 24 mEq/L (ref 19–32)
Calcium: 9.1 mg/dL (ref 8.4–10.5)
Chloride: 106 mEq/L (ref 96–112)
Creatinine, Ser: 0.74 mg/dL (ref 0.50–1.10)
GFR calc Af Amer: >90 mL/min (ref >90)
GFR calc non Af Amer: >90 mL/min (ref >90)
Glucose, Bld: 132 mg/dL — ABNORMAL HIGH (ref 70–99)
Potassium: 4 mEq/L (ref 3.7–5.3)
Sodium: 144 mEq/L (ref 137–147)

## 2014-11-15 LAB — CBC
HCT: 27.9 % — ABNORMAL LOW (ref 36.0–46.0)
Hemoglobin: 8.9 g/dL — ABNORMAL LOW (ref 12.0–15.0)
MCH: 23.4 pg — ABNORMAL LOW (ref 26.0–34.0)
MCHC: 31.9 g/dL (ref 30.0–36.0)
MCV: 73.2 fL — ABNORMAL LOW (ref 78.0–100.0)
Platelets: 368 10*3/uL (ref 150–400)
RBC: 3.81 MIL/uL — ABNORMAL LOW (ref 3.87–5.11)
RDW: 17.3 % — ABNORMAL HIGH (ref 11.5–15.5)
WBC: 8.5 10*3/uL (ref 4.0–10.5)

## 2014-11-15 MED ORDER — KCL IN DEXTROSE-NACL 20-5-0.45 MEQ/L-%-% IV SOLN
INTRAVENOUS | Status: DC
  Administered 2014-11-15 – 2014-11-16 (×2): via INTRAVENOUS
  Administered 2014-11-16: 50 mL via INTRAVENOUS
  Administered 2014-11-17: 14:00:00 via INTRAVENOUS
  Administered 2014-11-17: 1000 mL via INTRAVENOUS
  Administered 2014-11-18: 14:00:00 via INTRAVENOUS
  Filled 2014-11-15 (×8): qty 1000

## 2014-11-15 NOTE — Progress Notes (Signed)
PT Cancellation Note  Patient Details Name: Jenny [name] MRN: [id number] DOB: 1961-12-08   Cancelled Treatment:    Reason Eval/Treat Not Completed: Other (comment) (patient returning to bed after having ambulated with nursing.)   Claretha Cooper 11/15/2014, 3:00 PM

## 2014-11-15 NOTE — Progress Notes (Signed)
1 Day Post-Op Robotic-assisted Sigmoidectomy Subjective: Doing well, pain controlled, min nausea  Objective: Vital signs in last 24 hours: Temp:  [97.7 F (36.5 C)-99.1 F (37.3 C)] 98.5 F (36.9 C) (11/24 0610) Pulse Rate:  [73-96] 73 (11/24 0610) Resp:  [12-19] 18 (11/24 0610) BP: (109-136)/(63-82) 129/63 mmHg (11/24 0610) SpO2:  [100 %] 100 % (11/24 0610)   Intake/Output from previous day: 11/23 0701 - 11/24 0700 In: 3306.7 [I.V.:3306.7] Out: 3325 [Urine:3300; Blood:25] Intake/Output this shift:     General appearance: alert and cooperative GI: soft, appropriately tender  Incision: slight clear drainage present  Lab Results:   Recent Labs  11/15/14 0435  WBC 8.5  HGB 8.9*  HCT 27.9*  PLT 368   BMET  Recent Labs  11/15/14 0435  NA 144  K 4.0  CL 106  CO2 24  GLUCOSE 132*  BUN 10  CREATININE 0.74  CALCIUM 9.1   PT/INR No results for input(s): LABPROT, INR in the last 72 hours. ABG No results for input(s): PHART, HCO3 in the last 72 hours.  Invalid input(s): PCO2, PO2  MEDS, Scheduled . acetaminophen  1,000 mg Oral 4 times per day  . alvimopan  12 mg Oral BID  . enoxaparin (LOVENOX) injection  40 mg Subcutaneous Q24H  . Influenza vac split quadrivalent PF  0.5 mL Intramuscular Tomorrow-1000  . insulin aspart  0-5 Units Subcutaneous QHS    Studies/Results: No results found.  Assessment: s/p Procedure(s): XI ROBOT ASSISTED LAPAROSCOPIC PARTIAL COLECTOMY Patient Active Problem List   Diagnosis Date Noted  . Colon cancer 11/14/2014  . Rectal bleeding 08/11/2014  . Chest pain 2014-06-08  . SOB (shortness of breath) 06/08/14  . Loss or death of family member 2013/06/20  . OBESITY 05/29/2009    Expected post op course  Plan: Advance diet to clears Ambulate Reduced MIV fluids, good uop   LOS: 1 day     .Rosario Adie, [facility], [place]   11/15/2014 7:41 AM

## 2014-11-16 LAB — BASIC METABOLIC PANEL
Anion gap: 8 (ref 5–15)
BUN: 10 mg/dL (ref 6–23)
CO2: 27 mEq/L (ref 19–32)
Calcium: 9 mg/dL (ref 8.4–10.5)
Chloride: 103 mEq/L (ref 96–112)
Creatinine, Ser: 0.79 mg/dL (ref 0.50–1.10)
GFR calc Af Amer: >90 mL/min (ref >90)
GFR calc non Af Amer: >90 mL/min (ref >90)
Glucose, Bld: 129 mg/dL — ABNORMAL HIGH (ref 70–99)
Potassium: 4.5 mEq/L (ref 3.7–5.3)
Sodium: 138 mEq/L (ref 137–147)

## 2014-11-16 LAB — CBC
HCT: 27.4 % — ABNORMAL LOW (ref 36.0–46.0)
Hemoglobin: 8.5 g/dL — ABNORMAL LOW (ref 12.0–15.0)
MCH: 23.3 pg — ABNORMAL LOW (ref 26.0–34.0)
MCHC: 31 g/dL (ref 30.0–36.0)
MCV: 75.1 fL — ABNORMAL LOW (ref 78.0–100.0)
Platelets: 337 10*3/uL (ref 150–400)
RBC: 3.65 MIL/uL — ABNORMAL LOW (ref 3.87–5.11)
RDW: 17.6 % — ABNORMAL HIGH (ref 11.5–15.5)
WBC: 7.1 10*3/uL (ref 4.0–10.5)

## 2014-11-16 LAB — GLUCOSE, CAPILLARY
Glucose-Capillary: 118 mg/dL — ABNORMAL HIGH (ref 70–99)
Glucose-Capillary: 124 mg/dL — ABNORMAL HIGH (ref 70–99)
Glucose-Capillary: 116 mg/dL — ABNORMAL HIGH (ref 70–99)
Glucose-Capillary: 125 mg/dL — ABNORMAL HIGH (ref 70–99)

## 2014-11-16 MED ORDER — INFLUENZA VAC SPLIT QUAD 0.5 ML IM SUSY
0.5000 mL | PREFILLED_SYRINGE | INTRAMUSCULAR | Status: AC
  Administered 2014-11-20: 0.5 mL via INTRAMUSCULAR
  Filled 2014-11-16 (×5): qty 0.5

## 2014-11-16 NOTE — Progress Notes (Signed)
Pt ambulated X2 today. Pt has voided. Pt requested pain medicine and was given. Reported off to [employer].

## 2014-11-16 NOTE — Progress Notes (Signed)
2 Days Post-Op Robotic-assisted Sigmoidectomy Subjective: Doing well, pain controlled, min nausea, tolerated clears  Objective: Vital signs in last 24 hours: Temp:  [98.1 F (36.7 C)-99 F (37.2 C)] 98.4 F (36.9 C) (11/25 0535) Pulse Rate:  [59-70] 65 (11/25 0535) Resp:  [15-18] 15 (11/25 0535) BP: (98-112)/(55-58) 98/55 mmHg (11/25 0535) SpO2:  [98 %-100 %] 100 % (11/25 0535)   Intake/Output from previous day: 11/24 0701 - 11/25 0700 In: 1563.3 [P.O.:480; I.V.:1083.3] Out: 1880 [Urine:1880] Intake/Output this shift:   General appearance: alert and cooperative GI: soft, appropriately tender  Incision: slight clear drainage present  Lab Results:   Recent Labs  11/15/14 0435 11/16/14 0418  WBC 8.5 7.1  HGB 8.9* 8.5*  HCT 27.9* 27.4*  PLT 368 337   BMET  Recent Labs  11/15/14 0435 11/16/14 0418  NA 144 138  K 4.0 4.5  CL 106 103  CO2 24 27  GLUCOSE 132* 129*  BUN 10 10  CREATININE 0.74 0.79  CALCIUM 9.1 9.0   PT/INR No results for input(s): LABPROT, INR in the last 72 hours. ABG No results for input(s): PHART, HCO3 in the last 72 hours.  Invalid input(s): PCO2, PO2  MEDS, Scheduled . alvimopan  12 mg Oral BID  . enoxaparin (LOVENOX) injection  40 mg Subcutaneous Q24H  . Influenza vac split quadrivalent PF  0.5 mL Intramuscular Tomorrow-1000  . insulin aspart  0-5 Units Subcutaneous QHS    Studies/Results: No results found.  Assessment: s/p Procedure(s): XI ROBOT ASSISTED LAPAROSCOPIC PARTIAL COLECTOMY Patient Active Problem List   Diagnosis Date Noted  . Colon cancer 11/14/2014  . Rectal bleeding 08/11/2014  . Chest pain 05/29/14  . SOB (shortness of breath) May 29, 2014  . Loss or death of family member Jun 10, 2013  . OBESITY 05/29/2009    Expected post op course  Plan: Advance diet to [facility] Ambulate Cont min MIV fluids, uop adequate   LOS: 2 days     .Rosario Adie, MD [facility],  [place]   11/16/2014 8:29 AM

## 2014-11-16 NOTE — Evaluation (Signed)
Physical Therapy Evaluation Patient Details Name: Jenny [name] MRN: [id number] DOB: 08/28/1961 Today's Date: 11/16/2014   History of Present Illness  53 yo female adm 11/23 and is s/p sigmoidectomy due to colon CA;   Clinical Impression  Pt will benefit from PT to address deficits below; She needs to continue to work on bed mobility/transfers/transitional movements; She is doing well but requires incr time for all tasks and is no where near her baseline mobility; She also has a flight of stairs to get to her bedroom, pt and husband feel the bed downstairs will not work for her at this time.    Follow Up Recommendations No PT follow up    Equipment Recommendations  None recommended by PT    Recommendations for Other Services       Precautions / Restrictions Precautions Precautions: Other (comment) Precaution Comments: abd surgery--pain with mobility      Mobility  Bed Mobility Overal bed mobility: Needs Assistance Bed Mobility: Rolling;Sidelying to Sit Rolling: Min assist Sidelying to sit: Min assist;HOB elevated (HOB at 45*)       General bed mobility comments: incr time and cues for technique, pillow held to abdomen to assist with pain control  Transfers Overall transfer level: Needs assistance Equipment used: None Transfers: Sit to/from Stand Sit to Stand: Min assist         General transfer comment: from toilet and elevated bed; pt requires incr time, cues for technique, pillow to abdomen for pain control  Ambulation/Gait Ambulation/Gait assistance: Min guard;Supervision Ambulation Distance (Feet): 300 Feet (10') Assistive device: None Gait Pattern/deviations: Wide base of support;Trunk flexed   Gait velocity interpretation: Below normal speed for age/gender General Gait Details: pt is very guarded, decr trunk rotation and decr joint excursion LEs;   Stairs            Wheelchair Mobility    Modified Rankin (Stroke Patients Only)        Balance                                             Pertinent Vitals/Pain Pain Assessment: 0-10 Pain Score: 6  Pain Location: abd Pain Descriptors / Indicators: Sore Pain Intervention(s): Monitored during session;RN gave pain meds during session    Home Living Family/patient expects to be discharged to:: Private residence Living Arrangements: Spouse/significant other Available Help at Discharge: Family Type of Home: House Home Access: Stairs to enter   Technical brewer of Steps: 2 Home Layout: Two level;Able to live on main level with bedroom/bathroom Home Equipment: Kasandra Knudsen - single point Additional Comments: pt can go upstairs and stay    Prior Function Level of Independence: Independent               Hand Dominance        Extremity/Trunk Assessment   Upper Extremity Assessment: Overall WFL for tasks assessed           Lower Extremity Assessment: Overall WFL for tasks assessed         Communication   Communication: No difficulties  Cognition Arousal/Alertness: Awake/alert Behavior During Therapy: WFL for tasks assessed/performed Overall Cognitive Status: Within Functional Limits for tasks assessed                      General Comments      Exercises  Assessment/Plan    PT Assessment Patient needs continued PT services  PT Diagnosis Difficulty walking   PT Problem List Decreased mobility;Decreased activity tolerance;Pain  PT Treatment Interventions Gait training;Stair training;Functional mobility training;Therapeutic activities;Therapeutic exercise;Patient/family education   PT Goals (Current goals can be found in the Care Plan section) Acute Rehab PT Goals Patient Stated Goal: home when ready, walk with less pain PT Goal Formulation: With patient Time For Goal Achievement: 11/23/14 Potential to Achieve Goals: Good    Frequency Min 3X/week   Barriers to discharge        Co-evaluation                End of Session   Activity Tolerance: Patient tolerated treatment well;Patient limited by pain Patient left: in chair;with call bell/phone within reach;with family/visitor present           Time: 1517-6160 PT Time Calculation (min) (ACUTE ONLY): 35 min   Charges:   PT Evaluation $Initial PT Evaluation Tier I: 1 Procedure PT Treatments $Gait Training: 23-37 mins   PT G Codes:          Bob Eastwood 12/12/14, 10:53 AM

## 2014-11-17 LAB — CBC
HCT: 28.4 % — ABNORMAL LOW (ref 36.0–46.0)
Hemoglobin: 8.6 g/dL — ABNORMAL LOW (ref 12.0–15.0)
MCH: 22.8 pg — ABNORMAL LOW (ref 26.0–34.0)
MCHC: 30.3 g/dL (ref 30.0–36.0)
MCV: 75.1 fL — ABNORMAL LOW (ref 78.0–100.0)
Platelets: 335 10*3/uL (ref 150–400)
RBC: 3.78 MIL/uL — ABNORMAL LOW (ref 3.87–5.11)
RDW: 17.6 % — ABNORMAL HIGH (ref 11.5–15.5)
WBC: 7.2 10*3/uL (ref 4.0–10.5)

## 2014-11-17 LAB — BASIC METABOLIC PANEL
Anion gap: 8 (ref 5–15)
BUN: 10 mg/dL (ref 6–23)
CO2: 26 mEq/L (ref 19–32)
Calcium: 9.1 mg/dL (ref 8.4–10.5)
Chloride: 102 mEq/L (ref 96–112)
Creatinine, Ser: 0.78 mg/dL (ref 0.50–1.10)
GFR calc Af Amer: >90 mL/min (ref >90)
GFR calc non Af Amer: >90 mL/min (ref >90)
Glucose, Bld: 132 mg/dL — ABNORMAL HIGH (ref 70–99)
Potassium: 3.9 mEq/L (ref 3.7–5.3)
Sodium: 136 mEq/L — ABNORMAL LOW (ref 137–147)

## 2014-11-17 LAB — GLUCOSE, CAPILLARY
Glucose-Capillary: 115 mg/dL — ABNORMAL HIGH (ref 70–99)
Glucose-Capillary: 128 mg/dL — ABNORMAL HIGH (ref 70–99)
Glucose-Capillary: 140 mg/dL — ABNORMAL HIGH (ref 70–99)
Glucose-Capillary: 149 mg/dL — ABNORMAL HIGH (ref 70–99)

## 2014-11-17 NOTE — Plan of Care (Signed)
Problem: Phase I Progression Outcomes Goal: Voiding-avoid urinary catheter unless indicated Outcome: Completed/Met Date Met:  11/17/14     

## 2014-11-17 NOTE — Progress Notes (Signed)
Pharmacy Brief Note - Alvimopan (Entereg)  The standing order set for alvimopan (Entereg) now includes an automatic order to discontinue the drug after the patient has had a bowel movement. The change was approved by the [place] and the Medical Executive Committee.   This patient has had bowel movements documented by nursing. Therefore, alvimopan has been discontinued. If there are questions, please contact the pharmacy at [phone].   Thank you-  Dolly Rias RPh 11/17/2014, 9:46 AM Pager [phone]

## 2014-11-17 NOTE — Plan of Care (Signed)
Problem: Phase I Progression Outcomes Goal: Pain controlled with appropriate interventions Outcome: Progressing     

## 2014-11-17 NOTE — Progress Notes (Signed)
3 Days Post-Op Robotic-assisted Sigmoidectomy Subjective: Feeling bloated and nauseated.  Having flatus and a small BM  Objective: Vital signs in last 24 hours: Temp:  [98.2 F (36.8 C)-98.8 F (37.1 C)] 98.2 F (36.8 C) (11/26 0535) Pulse Rate:  [74-82] 82 (11/26 0535) Resp:  [16-18] 18 (11/26 0535) BP: (108-133)/(63-80) 133/80 mmHg (11/26 0535) SpO2:  [96 %-98 %] 97 % (11/26 0535)   Intake/Output from previous day: 11/25 0701 - 11/26 0700 In: 1200 [I.V.:1200] Out: 250 [Urine:250] Intake/Output this shift:   General appearance: alert and cooperative GI: soft, appropriately tender  Incision: slight clear drainage present  Lab Results:   Recent Labs  11/16/14 0418 11/17/14 0454  WBC 7.1 7.2  HGB 8.5* 8.6*  HCT 27.4* 28.4*  PLT 337 335   BMET  Recent Labs  11/16/14 0418 11/17/14 0454  NA 138 136*  K 4.5 3.9  CL 103 102  CO2 27 26  GLUCOSE 129* 132*  BUN 10 10  CREATININE 0.79 0.78  CALCIUM 9.0 9.1   PT/INR No results for input(s): LABPROT, INR in the last 72 hours. ABG No results for input(s): PHART, HCO3 in the last 72 hours.  Invalid input(s): PCO2, PO2  MEDS, Scheduled . alvimopan  12 mg Oral BID  . enoxaparin (LOVENOX) injection  40 mg Subcutaneous Q24H  . Influenza vac split quadrivalent PF  0.5 mL Intramuscular Tomorrow-1000  . insulin aspart  0-5 Units Subcutaneous QHS    Studies/Results: No results found.  Assessment: s/p Procedure(s): XI ROBOT ASSISTED LAPAROSCOPIC PARTIAL COLECTOMY Patient Active Problem List   Diagnosis Date Noted  . Colon cancer 11/14/2014  . Rectal bleeding 08/11/2014  . Chest pain 2014-06-04  . SOB (shortness of breath) 2014-06-04  . Loss or death of family member June 16, 2013  . OBESITY 05/29/2009    Seems to have a bit of an ileus despite bowel function  Plan: NPO today Ambulate Cont MIV fluids, uop adequate   LOS: 3 days     .Rosario Adie, [facility],  [place]   11/17/2014 7:55 AM

## 2014-11-18 LAB — GLUCOSE, CAPILLARY
Glucose-Capillary: 129 mg/dL — ABNORMAL HIGH (ref 70–99)
Glucose-Capillary: 136 mg/dL — ABNORMAL HIGH (ref 70–99)
Glucose-Capillary: 121 mg/dL — ABNORMAL HIGH (ref 70–99)

## 2014-11-18 NOTE — Plan of Care (Signed)
Problem: Consults Goal: Skin Care Protocol Initiated - if Braden Score 18 or less If consults are not indicated, leave blank or document N/A  Outcome: Not Applicable Date Met:  32/00/37  Problem: Phase I Progression Outcomes Goal: OOB as tolerated unless otherwise ordered Outcome: Completed/Met Date Met:  11/18/14 Goal: Incision/dressings dry and intact Outcome: Completed/Met Date Met:  11/18/14 Goal: Tubes/drains patent Outcome: Not Applicable Date Met:  94/44/61

## 2014-11-18 NOTE — Progress Notes (Signed)
4 Days Post-Op Robotic-assisted Sigmoidectomy Subjective: Feeling somewhat better.  Having flatus and BM's.  Walked several laps yesterday.    Objective: Vital signs in last 24 hours: Temp:  [98.4 F (36.9 C)-98.9 F (37.2 C)] 98.4 F (36.9 C) (11/27 0543) Pulse Rate:  [74-86] 74 (11/27 0543) Resp:  [16-18] 16 (11/27 0543) BP: (112-133)/(69-76) 120/69 mmHg (11/27 0543) SpO2:  [96 %-100 %] 96 % (11/27 0543)   Intake/Output from previous day: 11/26 0701 - 11/27 0700 In: 2289.2 [I.V.:2289.2] Out: 850 [Urine:850] Intake/Output this shift:   General appearance: alert and cooperative GI: soft, appropriately tender  Incision: clean, dry, intact  Lab Results:   Recent Labs  11/16/14 0418 11/17/14 0454  WBC 7.1 7.2  HGB 8.5* 8.6*  HCT 27.4* 28.4*  PLT 337 335   BMET  Recent Labs  11/16/14 0418 11/17/14 0454  NA 138 136*  K 4.5 3.9  CL 103 102  CO2 27 26  GLUCOSE 129* 132*  BUN 10 10  CREATININE 0.79 0.78  CALCIUM 9.0 9.1   PT/INR No results for input(s): LABPROT, INR in the last 72 hours. ABG No results for input(s): PHART, HCO3 in the last 72 hours.  Invalid input(s): PCO2, PO2  MEDS, Scheduled . enoxaparin (LOVENOX) injection  40 mg Subcutaneous Q24H  . Influenza vac split quadrivalent PF  0.5 mL Intramuscular Tomorrow-1000  . insulin aspart  0-5 Units Subcutaneous QHS    Studies/Results: No results found.  Assessment: s/p Procedure(s): XI ROBOT ASSISTED LAPAROSCOPIC PARTIAL COLECTOMY Patient Active Problem List   Diagnosis Date Noted  . Colon cancer 11/14/2014  . Rectal bleeding 08/11/2014  . Chest pain 2014/05/21  . SOB (shortness of breath) May 21, 2014  . Loss or death of family member 06-02-2013  . OBESITY 05/29/2009    Ileus seems better today.  Vitals and labs normal  Plan: Will try some full liquids per pt request Cont to ambulate Decreased MIV fluids, uop seems adequate   LOS: 4 days     .Rosario Adie, MD [facility], [place]   11/18/2014 8:01 AM

## 2014-11-19 LAB — GLUCOSE, CAPILLARY
Glucose-Capillary: 113 mg/dL — ABNORMAL HIGH (ref 70–99)
Glucose-Capillary: 149 mg/dL — ABNORMAL HIGH (ref 70–99)
Glucose-Capillary: 130 mg/dL — ABNORMAL HIGH (ref 70–99)
Glucose-Capillary: 141 mg/dL — ABNORMAL HIGH (ref 70–99)

## 2014-11-19 NOTE — Plan of Care (Signed)
Problem: Phase I Progression Outcomes Goal: Pain controlled with appropriate interventions Outcome: Completed/Met Date Met:  11/19/14     

## 2014-11-19 NOTE — Progress Notes (Signed)
5 Days Post-Op Robotic-assisted Sigmoidectomy Subjective: Feeling better.  Having flatus and denies any nausea.  Walked several laps yesterday.    Objective: Vital signs in last 24 hours: Temp:  [98.1 F (36.7 C)-98.4 F (36.9 C)] 98.1 F (36.7 C) (11/28 0522) Pulse Rate:  [67-80] 67 (11/28 0522) Resp:  [16] 16 (11/28 0522) BP: (100-108)/(50-59) 100/50 mmHg (11/28 0522) SpO2:  [96 %-99 %] 96 % (11/28 0522)   Intake/Output from previous day: 11/27 0701 - 11/28 0700 In: 1135.8 [P.O.:360; I.V.:775.8] Out: 1452 [Urine:1450; Stool:2] Intake/Output this shift:   General appearance: alert and cooperative GI: soft, appropriately tender  Incision: clean, dry, intact  Lab Results:   Recent Labs  11/17/14 0454  WBC 7.2  HGB 8.6*  HCT 28.4*  PLT 335   BMET  Recent Labs  11/17/14 0454  NA 136*  K 3.9  CL 102  CO2 26  GLUCOSE 132*  BUN 10  CREATININE 0.78  CALCIUM 9.1   PT/INR No results for input(s): LABPROT, INR in the last 72 hours. ABG No results for input(s): PHART, HCO3 in the last 72 hours.  Invalid input(s): PCO2, PO2  MEDS, Scheduled . enoxaparin (LOVENOX) injection  40 mg Subcutaneous Q24H  . Influenza vac split quadrivalent PF  0.5 mL Intramuscular Tomorrow-1000  . insulin aspart  0-5 Units Subcutaneous QHS    Studies/Results: No results found.  Assessment: s/p Procedure(s): XI ROBOT ASSISTED LAPAROSCOPIC PARTIAL COLECTOMY Patient Active Problem List   Diagnosis Date Noted  . Colon cancer 11/14/2014  . Rectal bleeding 08/11/2014  . Chest pain 05-18-14  . SOB (shortness of breath) 2014-05-18  . Loss or death of family member 05-30-13  . OBESITY 05/29/2009    Ileus seems better today.  Vitals normal  Plan: Will advance to soft diet SL IV Cont to ambulate Anticipate d/c in AM   LOS: 5 days     .Rosario Adie, MD [facility], [place]   11/19/2014 8:05 AM

## 2014-11-19 NOTE — Plan of Care (Signed)
Problem: Phase I Progression Outcomes Goal: Initial discharge plan identified Outcome: Completed/Met Date Met:  11/19/14 Goal: Vital signs/hemodynamically stable Outcome: Completed/Met Date Met:  11/19/14 Goal: Other Phase I Outcomes/Goals Outcome: Completed/Met Date Met:  11/19/14  Problem: Phase II Progression Outcomes Goal: Pain controlled Outcome: Completed/Met Date Met:  11/19/14

## 2014-11-19 NOTE — Progress Notes (Signed)
PT Cancellation Note  Patient Details Name: Jenny [name] MRN: [id number] DOB: 1961/02/05   Cancelled Treatment:    Reason Eval/Treat Not Completed: Other (comment)had ambulated  earlier and plans to walk again after she eats.   Claretha Cooper 11/19/2014, 10:48 AM Tresa Endo PT [phone]

## 2014-11-20 LAB — GLUCOSE, CAPILLARY
Glucose-Capillary: 133 mg/dL — ABNORMAL HIGH (ref 70–99)
Glucose-Capillary: 107 mg/dL — ABNORMAL HIGH (ref 70–99)

## 2014-11-20 MED ORDER — OXYCODONE-ACETAMINOPHEN 5-325 MG PO TABS: 1.0000 | ORAL_TABLET | ORAL | Status: DC | PRN

## 2014-11-20 NOTE — Plan of Care (Signed)
Problem: Phase II Progression Outcomes Goal: Dressings dry/intact Outcome: Completed/Met Date Met:  11/20/14 Goal: Return of bowel function (flatus, BM) IF ABDOMINAL SURGERY:  Outcome: Completed/Met Date Met:  11/20/14 Goal: Foley discontinued Outcome: Completed/Met Date Met:  11/20/14 Goal: Discharge plan established Outcome: Completed/Met Date Met:  11/20/14 Goal: Tolerating diet Outcome: Completed/Met Date Met:  11/20/14 Goal: Other Phase II Outcomes/Goals Outcome: Completed/Met Date Met:  11/20/14  Problem: Phase III Progression Outcomes Goal: Pain controlled on oral analgesia Outcome: Completed/Met Date Met:  11/20/14 Goal: Activity at appropriate level-compared to baseline (UP IN CHAIR FOR HEMODIALYSIS)  Outcome: Completed/Met Date Met:  11/20/14 Goal: Voiding independently Outcome: Completed/Met Date Met:  11/20/14 Goal: IV changed to normal saline lock Outcome: Completed/Met Date Met:  11/20/14 Goal: Discharge plan remains appropriate-arrangements made Outcome: Completed/Met Date Met:  11/20/14 Goal: Demonstrates TCDB, IS independently Outcome: Completed/Met Date Met:  11/20/14 Goal: Other Phase III Outcomes/Goals Outcome: Completed/Met Date Met:  11/20/14     

## 2014-11-20 NOTE — Plan of Care (Signed)
Problem: Phase I Progression Outcomes Goal: Sutures/staples intact Outcome: Not Applicable Date Met:  34/35/68  Problem: Phase II Progression Outcomes Goal: Progress activity as tolerated unless otherwise ordered Outcome: Completed/Met Date Met:  11/20/14 Goal: Progressing with IS, TCDB Outcome: Completed/Met Date Met:  11/20/14 Goal: Vital signs stable Outcome: Completed/Met Date Met:  11/20/14 Goal: Surgical site without signs of infection Outcome: Completed/Met Date Met:  11/20/14

## 2014-11-20 NOTE — Plan of Care (Signed)
Problem: Discharge Progression Outcomes Goal: Barriers To Progression Addressed/Resolved Outcome: Completed/Met Date Met:  11/20/14 Goal: Discharge plan in place and appropriate Outcome: Completed/Met Date Met:  11/20/14 Goal: Pain controlled with appropriate interventions Outcome: Completed/Met Date Met:  11/20/14 Goal: Hemodynamically stable Outcome: Completed/Met Date Met:  10/18/24 Goal: Complications resolved/controlled Outcome: Completed/Met Date Met:  11/20/14 Goal: Tolerating diet Outcome: Completed/Met Date Met:  11/20/14 Goal: Activity appropriate for discharge plan Outcome: Completed/Met Date Met:  11/20/14 Goal: Tubes and drains discontinued if indicated Outcome: Completed/Met Date Met:  11/20/14 Goal: Other Discharge Outcomes/Goals Outcome: Completed/Met Date Met:  11/20/14

## 2014-11-20 NOTE — Discharge Instructions (Signed)

## 2014-11-20 NOTE — Discharge Summary (Signed)
Physician Discharge Summary  Patient ID: Jenny [name] MRN: [id number] DOB/AGE: 51-Oct-1962 51 y.o.  Admit date: 11/14/2014 Discharge date: 11/20/2014  Admission Diagnoses: colon cancer  Discharge Diagnoses:  Active Problems:   Colon cancer   Discharged Condition: good  Hospital Course: Patient admitted after robotic assisted sigmoidectomy for colon cancer.  She did well post operatively.  Her foley was removed on POD 2.  She was kept on a liquid diet until return of bowel function.  Her diet was help on POD 3 due to concern for ileus.  By POD 6 she was tolerating a soft diet and her pain was controlled on PO medications.  She was able to ambulate and found to be in stable condition for discharge to home.    Consults: None  Significant Diagnostic Studies: labs: CBG, CBC, chemistry  Treatments: IV hydration, analgesia: acetaminophen w/ codeine, insulin: regular and surgery: robotic assisted sigmoidectomy.    Discharge Exam: Blood pressure 98/62, pulse 70, temperature 98.5 F (36.9 C), temperature source Oral, resp. rate 18, height 5\' 4"  (1.626 m), weight 260 lb (117.935 kg), SpO2 100 %. General appearance: alert and cooperative GI: normal findings: soft, non-tender Incision/Wound: clean, dry, intact  Disposition: 01-Home or Self Care     Medication List    STOP taking these medications        acetaminophen 500 MG tablet  Commonly known as:  TYLENOL     MOVIPREP 100 G Solr  Generic drug:  peg 3350 powder      TAKE these medications        ferrous sulfate 325 (65 FE) MG tablet  Take 1 tablet (325 mg total) by mouth daily with breakfast.     loperamide 2 MG capsule  Commonly known as:  IMODIUM  Take 1 capsule (2 mg total) by mouth as needed for diarrhea or loose stools.     oxyCODONE-acetaminophen 5-325 MG per tablet  Commonly known as:  PERCOCET/ROXICET  Take 1-2 tablets by mouth every 4 (four) hours as needed for moderate pain.     polyethylene glycol powder  powder  Commonly known as:  GLYCOLAX/MIRALAX  Take 17 g by mouth daily. Increase by 1 packet a day until having one regular, nonstraining bowel movement a day           Follow-up Information    Follow up with Rosario Adie., MD. Schedule an appointment as soon as possible for a visit in 2 weeks.   Specialty:  General Surgery   Contact information:   [address] [place]  [id number] [phone]       Signed: Rosario Adie 51/73/2202, 7:38 AM

## 2014-11-22 ENCOUNTER — Encounter (HOSPITAL_COMMUNITY): Payer: Self-pay

## 2014-11-23 ENCOUNTER — Telehealth: Payer: Self-pay | Admitting: Hematology

## 2014-11-23 ENCOUNTER — Telehealth (INDEPENDENT_AMBULATORY_CARE_PROVIDER_SITE_OTHER): Payer: Self-pay | Admitting: General Surgery

## 2014-11-23 DIAGNOSIS — C187 Malignant neoplasm of sigmoid colon: Secondary | ICD-10-CM

## 2014-11-23 NOTE — Telephone Encounter (Signed)
Discussed path with pt.  Will refer to oncology

## 2014-11-23 NOTE — Telephone Encounter (Signed)
S/W PATIENT AND GAVE NP APPT FOR 12/14 @ 1:30 W/DR. Sheldahl DX-CANCER OF SIGMOID COLON WELCOME PACKET MAILED Garrel Ridgel

## 2014-12-04 NOTE — Progress Notes (Signed)
[place]  [phone] [phone]) [place] Note   Patient Care Team: Lind Covert, MD as PCP - General Juanita Craver, MD as Consulting Physician (Gastroenterology) [name], MD as Consulting Physician (General Surgery) Truitt Merle, MD as Consulting Physician (Hematology) 12/05/2014  CHIEF COMPLAINTS/PURPOSE OF CONSULTATION:  Stage III colon cancer   HISTORY OF PRESENTING ILLNESS:  Jenny [name] 53 y.o. female without a significant past medical history, who is referred by her surgeon Dr. Marcello Moores to discuss adjuvant chemotherapy for her resected colon cancer.  She presented with abdominal pain for 8-9 month, which was related to position. She noticed bloody stool in Aug 2015. She was seen by PCP, and was referred to GI Dr. Collene Mares, and underwent colonoscopy which showed a mass at sigmoid colon (per patient, I do not have the report). Biopsy showed adenocarcinoma. She was then referred to surgeon Dr. Marcello Moores and underwent sigmoid colon segmentectomy on 11/14/2014.   She is still recovering from the surgery. She has intermittent pain from surgical site, she is taking percocet 2-3 times a day. She has normal BM, no blood, no nausea vomiting.  She has decent appetite, no recent weight loss. She has moderate fatigue, able to take care of herself, but not much else. She was physically very active for the surgery.  MEDICAL HISTORY:  Past Medical History  Diagnosis Date  . Enlarged thyroid   . Anemia   . Cancer     colon cancer     SURGICAL HISTORY: Past Surgical History  Procedure Laterality Date  . Tonsillectomy    . Cervical spine surgery  06/17/2012    C5-C7 ACDF  . Cesarean section    . Partial hysterectomy      SOCIAL HISTORY: History   Social History  . Marital Status: Married    Spouse Name: N/A    Number of Children: 2  . Years of Education: N/A   Occupational History  . Not on file.   Social History Main Topics   . Smoking status: Never Smoker   . Smokeless tobacco: Never Used  . Alcohol Use: No  . Drug Use: No  . Sexual Activity: Not on file    P2G2, one child was murdered. She is a Paramedic.   FAMILY HISTORY: Family History  Problem Relation Age of Onset  . Colon cancer Neg Hx   . Liver Cancer Brother        . Hypertension Mother     ALLERGIES:  has No Known Allergies.  MEDICATIONS:  Current Outpatient Prescriptions  Medication Sig Dispense Refill  . ferrous sulfate 325 (65 FE) MG tablet Take 1 tablet (325 mg total) by mouth daily with breakfast. 30 tablet 3  . loperamide (IMODIUM) 2 MG capsule Take 1 capsule (2 mg total) by mouth as needed for diarrhea or loose stools. 30 capsule 0  . oxyCODONE-acetaminophen (PERCOCET/ROXICET) 5-325 MG per tablet Take 1-2 tablets by mouth every 4 (four) hours as needed for moderate pain. 30 tablet 0  . polyethylene glycol powder (GLYCOLAX/MIRALAX) powder Take 17 g by mouth daily. Increase by 1 packet a day until having one regular, nonstraining bowel movement a day 3350 g 1   No current facility-administered medications for this visit.    REVIEW OF SYSTEMS:   Constitutional: Denies fevers, chills or abnormal night sweats Eyes: Denies blurriness of vision, double vision or watery eyes Ears, nose, mouth, throat, and face: Denies mucositis or sore throat Respiratory: Denies cough,  dyspnea or wheezes Cardiovascular: Denies palpitation, chest discomfort or lower extremity swelling Gastrointestinal:  Denies nausea, heartburn or change in bowel habits Skin: Denies abnormal skin rashes Lymphatics: Denies new lymphadenopathy or easy bruising Neurological:Denies numbness, tingling or new weaknesses Behavioral/Psych: Mood is stable, no new changes  All other systems were reviewed with the patient and are negative.  PHYSICAL EXAMINATION: ECOG PERFORMANCE STATUS: 2 - Symptomatic, <50% confined to bed  Filed Vitals:   12/05/14 1356  BP:  104/66  Pulse: 75  Temp: 99.1 F (37.3 C)  Resp: 19   Filed Weights   12/05/14 1356  Weight: 248 lb 4.8 oz (112.628 kg)    GENERAL:alert, no distress and comfortable SKIN: skin color, texture, turgor are normal, no rashes or significant lesions EYES: normal, conjunctiva are pink and non-injected, sclera clear OROPHARYNX:no exudate, no erythema and lips, buccal mucosa, and tongue normal  NECK: supple, thyroid normal size, non-tender, without nodularity LYMPH:  no palpable lymphadenopathy in the cervical, axillary or inguinal LUNGS: clear to auscultation and percussion with normal breathing effort HEART: regular rate & rhythm and no murmurs and no lower extremity edema ABDOMEN:abdomen soft, non-tender and normal bowel sounds Musculoskeletal:no cyanosis of digits and no clubbing  PSYCH: alert & oriented x 3 with fluent speech NEURO: no focal motor/sensory deficits  LABORATORY DATA:  I have reviewed the data as listed Lab Results  Component Value Date   WBC 7.2 11/17/2014   HGB 8.6* 11/17/2014   HCT 28.4* 11/17/2014   MCV 75.1* 11/17/2014   PLT 335 11/17/2014    Recent Labs  05/12/14 1104  11/15/14 0435 11/16/14 0418 11/17/14 0454  NA 138  < > 144 138 136*  K 4.9  < > 4.0 4.5 3.9  CL 101  < > 106 103 102  CO2 23  < > $R'24 27 26  'Xq$ GLUCOSE 123*  < > 132* 129* 132*  BUN 10  < > $R'10 10 10  'ZS$ CREATININE 0.70  < > 0.74 0.79 0.78  CALCIUM 9.6  < > 9.1 9.0 9.1  GFRNONAA >89  < > >90 >90 >90  GFRAA >89  < > >90 >90 >90  PROT 7.7  --   --   --   --   ALBUMIN 4.2  --   --   --   --   AST 24  --   --   --   --   ALT 19  --   --   --   --   ALKPHOS 69  --   --   --   --   BILITOT 0.4  --   --   --   --   < > = values in this interval not displayed.  PATHOLOGY REPORT 11/14/2014 Diagnosis(continued) 1. Colon, segmental resection for tumor, sigmoid - INVASIVE ADENOCARCINOMA, INVADING THROUGH THE MUSCULARIS PROPRIA INTO PERICOLONIC FATTY TISSUE. - ONE OF TWENTY-EIGHT LYMPH NODES,  POSITIVE FOR METASTATIC CARCINOMA (1/28). - RESECTION MARGINS, NEGATIVE FOR ATYPIA OR MALIGNANCY - RESECTION MARGINS, NEGATIVE FOR ATYPIA OR MALIGNANCY. 2. Colon, resection margin (donut), colon final distal margin - BENIGN COLONIC MUCOSA, NO EVIDENCE OF MALIGNANCY. Microscopic Comment 1. COLON Specimen: Sigmoid colon Procedure: Segmental resection Tumor site: Sigmoid colon Specimen integrity: Intact Macroscopic intactness of mesorectum: N/A Macroscopic tumor perforation: No Invasive tumor: Maximum size: 2.7 cm, gross measurement Histologic type(s): Invasive adenocarcinoma Histologic grade and differentiation: G2: moderately differentiated/low grade Type of polyp tumor arose from: Tubular adenoma Microscopic extension of invasive tumor: Invading through  the muscularis propria into pericolonic fatty tissue. Lymph-Vascular invasion: Not identified Peri-neural invasion: Not identified Tumor deposit(s) (discontinuous extramural extension): N/A Resection margins: Negative Open margin: 7.1 cm Stapled margin: 5.5 cm Mesenteric margin (sigmoid and transverse): 9 cm Treatment effect (neo-adjuvant therapy): No Additional polyp(s): N/A Non-neoplastic findings: N/A Lymph nodes: number examined 28; number positive: 1 Pathologic Staging: pT3, pN1a, pMX Ancillary studies: MMR stains will be performed and an addendum report will follow. MSI testing will be performed at an outside institution and the results will be available in EPIC. (HCL:kh 11-15-14)  TUMOR MSI: stable (by PCR),  MMR: NORMAL   RADIOGRAPHIC STUDIES: I have personally reviewed the radiological images as listed and agreed with the findings in the report.  CT chest 10/25/2014 IMPRESSION: No CT findings for pulmonary metastatic disease. No mediastinal or hilar mass or adenopathy.  CT abdomen Pelvis w contrast 10/10/2014 Eccentric wall thickening along the sigmoid colon, likely corresponding to the finding on colonoscopy,  worrisome for early colonic adenocarcinoma.  No findings suspicious for metastatic disease.  ASSESSMENT & PLAN:  53 year old African-American female, without significant past medical history, presents with abdominal pain and bloody bowel movement, anemia, was found to have a sigmoid adenocarcinoma, status post complete surgical resection.   1. Sigmoid colon adenocarcinoma, G2, pT3pN1aM0, stage IIIB, MSI stable  -I reviewed her surgical pathology findings with her and her husband in great details.  -She has 30-40% cancer recurrence in the next 5 years. I recommend adjuvant chemotherapy to reduce her risks of cancer recurrence, which is the standard care for stage III colon cancer, per NCCN guideline. -I would recommend 5-FU or capecitabine in combination with oxaliplatin, a total of 6 months adjuvant treatment. Potential side effects were reviewed with them in great detail, and the written material was given to patient.  -She would need a port placement by Dr. Marcello Moores if she agrees to do 5-FU infusion. -We also discussed the 5 year surveillance plan after she completes the adjuvant therapy.  2. Abdominal pain, secondary to surgery -She is taking Percocet as needed, and her Pain is controlled.  3. Microcytic anemia, likely iron deficient anemia secondary to colon cancer -I'll check her CBC, iron, TIBC, ferritin levels today.  -She is not able to tolerate by mouth iron pill due to her recent surgery. If she has very low ferritin level, I'll likely give her 1 dose of IV iron Feraheme.  Plan #1 she will call us in the next 2 weeks to let us know her decisions about chemotherapy and which chemotherapy agent she would like to take. #2 chemotherapy class within the next 2 weeks #3 I'll see her back on the first week of January to finalize her plan for adjuvant chemotherapy. #4 possible port placement by Dr. Marcello Moores in the next 2 weeks #4 lab today.  All questions were answered. The patient knows  to call the clinic with any problems, questions or concerns. I spent 40 minutes counseling the patient face to face. The total time spent in the appointment was 60 minutes and more than 50% was on counseling.     Truitt Merle, MD 12/05/2014 2:20 PM

## 2014-12-05 ENCOUNTER — Ambulatory Visit: Payer: BC Managed Care – PPO

## 2014-12-05 ENCOUNTER — Encounter: Payer: Self-pay | Admitting: Hematology

## 2014-12-05 ENCOUNTER — Ambulatory Visit (HOSPITAL_BASED_OUTPATIENT_CLINIC_OR_DEPARTMENT_OTHER): Payer: BC Managed Care – PPO | Admitting: Hematology

## 2014-12-05 ENCOUNTER — Encounter (INDEPENDENT_AMBULATORY_CARE_PROVIDER_SITE_OTHER): Payer: Self-pay

## 2014-12-05 ENCOUNTER — Ambulatory Visit (HOSPITAL_BASED_OUTPATIENT_CLINIC_OR_DEPARTMENT_OTHER): Payer: BC Managed Care – PPO

## 2014-12-05 ENCOUNTER — Telehealth: Payer: Self-pay | Admitting: Hematology

## 2014-12-05 VITALS — BP 104/66 | HR 75 | Temp 99.1°F | Resp 19 | Ht 64.0 in | Wt 248.3 lb

## 2014-12-05 DIAGNOSIS — C189 Malignant neoplasm of colon, unspecified: Secondary | ICD-10-CM

## 2014-12-05 DIAGNOSIS — D649 Anemia, unspecified: Secondary | ICD-10-CM

## 2014-12-05 DIAGNOSIS — C187 Malignant neoplasm of sigmoid colon: Secondary | ICD-10-CM

## 2014-12-05 DIAGNOSIS — G8918 Other acute postprocedural pain: Secondary | ICD-10-CM

## 2014-12-05 LAB — COMPREHENSIVE METABOLIC PANEL (CC13)
ALT: 17 U/L (ref 0–55)
AST: 10 U/L (ref 5–34)
Albumin: 3.8 g/dL (ref 3.5–5.0)
Alkaline Phosphatase: 59 U/L (ref 40–150)
Anion Gap: 10 mEq/L (ref 3–11)
BUN: 10.2 mg/dL (ref 7.0–26.0)
CO2: 23 mEq/L (ref 22–29)
Calcium: 9.7 mg/dL (ref 8.4–10.4)
Chloride: 105 mEq/L (ref 98–109)
Creatinine: 0.8 mg/dL (ref 0.6–1.1)
EGFR: >90 mL/min/{1.73_m2} (ref >90)
Glucose: 148 mg/dl — ABNORMAL HIGH (ref 70–140)
Potassium: 4 mEq/L (ref 3.5–5.1)
Sodium: 138 mEq/L (ref 136–145)
Total Bilirubin: 0.29 mg/dL (ref 0.20–1.20)
Total Protein: 7.3 g/dL (ref 6.4–8.3)

## 2014-12-05 LAB — CBC & DIFF AND RETIC
BASO%: 0.5 % (ref 0.0–2.0)
Basophils Absolute: 0 10*3/uL (ref 0.0–0.1)
EOS%: 3.6 % (ref 0.0–7.0)
Eosinophils Absolute: 0.2 10*3/uL (ref 0.0–0.5)
HCT: 33.1 % — ABNORMAL LOW (ref 34.8–46.6)
HGB: 10.2 g/dL — ABNORMAL LOW (ref 11.6–15.9)
Immature Retic Fract: 17 % — ABNORMAL HIGH (ref 1.60–10.00)
LYMPH%: 32.6 % (ref 14.0–49.7)
MCH: 22.2 pg — ABNORMAL LOW (ref 25.1–34.0)
MCHC: 30.8 g/dL — ABNORMAL LOW (ref 31.5–36.0)
MCV: 72.1 fL — ABNORMAL LOW (ref 79.5–101.0)
MONO#: 0.3 10*3/uL (ref 0.1–0.9)
MONO%: 4.8 % (ref 0.0–14.0)
NEUT#: 3.6 10*3/uL (ref 1.5–6.5)
NEUT%: 58.5 % (ref 38.4–76.8)
Platelets: 468 10*3/uL — ABNORMAL HIGH (ref 145–400)
RBC: 4.59 10*6/uL (ref 3.70–5.45)
RDW: 17.5 % — ABNORMAL HIGH (ref 11.2–14.5)
Retic %: 1.4 % (ref 0.70–2.10)
Retic Ct Abs: 64.26 10*3/uL (ref 33.70–90.70)
WBC: 6.1 10*3/uL (ref 3.9–10.3)
lymph#: 2 10*3/uL (ref 0.9–3.3)

## 2014-12-05 NOTE — Progress Notes (Signed)
Checked in new pt with no financial concerns at this time.  Pt has my card for any billing or ins questions or concerns.

## 2014-12-05 NOTE — Telephone Encounter (Signed)
Gave avs & cal for Jan. °

## 2014-12-06 LAB — IRON AND TIBC CHCC
%SAT: 6 % — ABNORMAL LOW (ref 21–57)
Iron: 18 ug/dL — ABNORMAL LOW (ref 41–142)
TIBC: 324 ug/dL (ref 236–444)
UIBC: 306 ug/dL (ref 120–384)

## 2014-12-06 LAB — FERRITIN CHCC: Ferritin: 22 ng/ml (ref 9–269)

## 2014-12-06 LAB — CEA: CEA: <0.5 ng/mL (ref 0.0–5.0)

## 2014-12-13 ENCOUNTER — Other Ambulatory Visit (INDEPENDENT_AMBULATORY_CARE_PROVIDER_SITE_OTHER): Payer: Self-pay | Admitting: General Surgery

## 2014-12-14 ENCOUNTER — Encounter: Payer: Self-pay | Admitting: Family Medicine

## 2014-12-14 ENCOUNTER — Ambulatory Visit (INDEPENDENT_AMBULATORY_CARE_PROVIDER_SITE_OTHER): Payer: BC Managed Care – PPO | Admitting: Family Medicine

## 2014-12-14 ENCOUNTER — Telehealth: Payer: Self-pay | Admitting: *Deleted

## 2014-12-14 VITALS — BP 133/85 | HR 79 | Temp 98.0°F | Wt 249.0 lb

## 2014-12-14 DIAGNOSIS — E049 Nontoxic goiter, unspecified: Secondary | ICD-10-CM | POA: Insufficient documentation

## 2014-12-14 DIAGNOSIS — E010 Iodine-deficiency related diffuse (endemic) goiter: Secondary | ICD-10-CM

## 2014-12-14 DIAGNOSIS — L259 Unspecified contact dermatitis, unspecified cause: Secondary | ICD-10-CM | POA: Insufficient documentation

## 2014-12-14 DIAGNOSIS — E890 Postprocedural hypothyroidism: Secondary | ICD-10-CM | POA: Insufficient documentation

## 2014-12-14 MED ORDER — TRIAMCINOLONE ACETONIDE 0.025 % EX OINT: 1.0000 "application " | TOPICAL_OINTMENT | Freq: Two times a day (BID) | CUTANEOUS | Status: DC

## 2014-12-14 NOTE — Telephone Encounter (Signed)
Pt called and left message re:  Pt is ready to take the treatment that Dr. Burr Medico had discussed with pt at last office visit.  Pt stated she would like to take treatment before end of year. Called pt back and left message for pt informing her that message will be relayed to Dr. Burr Medico for review when md returns to office 12/19/14. Pt's   Phone    [phone].

## 2014-12-14 NOTE — Progress Notes (Signed)
   Subjective:    Patient ID: Jenny [name], female    DOB: 07-25-1961, 53 y.o.   MRN: [id number]  HPI  Rash Itchy irritated rash with bumps around each trocar site from her abdomen surgery - none around central scar for last 2  weeks.  No shortness of breath or fever or mouth lesions.  Had be using lidocaine cream on these sites stopped one week ago    Thyromegaly Found on CT scan for follow up colon cancer.  She occasionally feels it is enlarged and sometimes might interfere with swallowing a little No temperature intolerance or night sweats.  About to start chemotherapy  Chief Complaint noted Review of Symptoms - see HPI PMH - Smoking status noted.   Vital Signs reviewed   Review of Systems     Objective:   Physical Exam Alert no acute distress Visible thyroid enlargement on R with palpable smooth thryoid R > left Red papular rash nontender around each laproscopic site.  No discharge or purulence  Mouth - no lesions, mucous membranes are moist, no decaying teeth         Assessment & Plan:

## 2014-12-14 NOTE — Patient Instructions (Signed)
Good to see you today!  Thanks for coming in.  Use the ointment twice daily for the next 7-10 days the rash should be gone by then If not call me or if it is spreading or any sores in your mouth  We will schedule a thyroid US and call you with the results  I will call you if your tests are not good.  Otherwise I will send you a letter.  If you do not hear from me with in 2 weeks please call our office.

## 2014-12-14 NOTE — Assessment & Plan Note (Addendum)
Likely due to surgery or lidocaine. No signs of infection.  Treat with steroid ointment

## 2014-12-14 NOTE — Assessment & Plan Note (Signed)
Incidental finding on CT.  Likely benign goiter.  Will check [name] and TSH

## 2014-12-15 LAB — TSH: TSH: 0.817 u[IU]/mL (ref 0.350–4.500)

## 2014-12-18 ENCOUNTER — Encounter: Payer: Self-pay | Admitting: Family Medicine

## 2014-12-19 ENCOUNTER — Encounter (HOSPITAL_COMMUNITY): Payer: Self-pay | Admitting: *Deleted

## 2014-12-19 ENCOUNTER — Other Ambulatory Visit: Payer: Self-pay | Admitting: *Deleted

## 2014-12-19 DIAGNOSIS — C189 Malignant neoplasm of colon, unspecified: Secondary | ICD-10-CM

## 2014-12-20 ENCOUNTER — Telehealth: Payer: Self-pay | Admitting: *Deleted

## 2014-12-20 NOTE — Telephone Encounter (Signed)
Per staff message and POF I have scheduled appts. Advised scheduler of appts. JMW  

## 2014-12-21 ENCOUNTER — Other Ambulatory Visit: Payer: Self-pay | Admitting: *Deleted

## 2014-12-21 ENCOUNTER — Telehealth: Payer: Self-pay | Admitting: Hematology

## 2014-12-21 ENCOUNTER — Ambulatory Visit (HOSPITAL_COMMUNITY)
Admission: RE | Admit: 2014-12-21 | Discharge: 2014-12-21 | Disposition: A | Discharge status: Home / Self Care | Payer: BC Managed Care – PPO | Source: Ambulatory Visit | Attending: Family Medicine | Admitting: Family Medicine

## 2014-12-21 DIAGNOSIS — E041 Nontoxic single thyroid nodule: Secondary | ICD-10-CM | POA: Insufficient documentation

## 2014-12-21 DIAGNOSIS — R221 Localized swelling, mass and lump, neck: Secondary | ICD-10-CM | POA: Diagnosis not present

## 2014-12-21 DIAGNOSIS — E010 Iodine-deficiency related diffuse (endemic) goiter: Secondary | ICD-10-CM

## 2014-12-21 NOTE — Telephone Encounter (Signed)
LM to confirm appt for Jan. °

## 2014-12-22 ENCOUNTER — Ambulatory Visit (HOSPITAL_COMMUNITY): Payer: BC Managed Care – PPO

## 2014-12-22 ENCOUNTER — Ambulatory Visit (HOSPITAL_COMMUNITY): Payer: BC Managed Care – PPO | Admitting: Anesthesiology

## 2014-12-22 ENCOUNTER — Ambulatory Visit (HOSPITAL_COMMUNITY)
Admission: RE | Admit: 2014-12-22 | Discharge: 2014-12-22 | Disposition: A | Discharge status: Home / Self Care | Payer: BC Managed Care – PPO | Source: Ambulatory Visit | Attending: General Surgery | Admitting: General Surgery

## 2014-12-22 ENCOUNTER — Encounter (HOSPITAL_COMMUNITY)
Admission: RE | Disposition: A | Discharge status: Home / Self Care | Payer: Self-pay | Source: Ambulatory Visit | Attending: General Surgery

## 2014-12-22 ENCOUNTER — Encounter (HOSPITAL_COMMUNITY): Payer: Self-pay | Admitting: *Deleted

## 2014-12-22 DIAGNOSIS — Z6841 Body Mass Index (BMI) 40.0 and over, adult: Secondary | ICD-10-CM | POA: Diagnosis not present

## 2014-12-22 DIAGNOSIS — C187 Malignant neoplasm of sigmoid colon: Secondary | ICD-10-CM | POA: Insufficient documentation

## 2014-12-22 DIAGNOSIS — E669 Obesity, unspecified: Secondary | ICD-10-CM | POA: Diagnosis not present

## 2014-12-22 DIAGNOSIS — E119 Type 2 diabetes mellitus without complications: Secondary | ICD-10-CM | POA: Insufficient documentation

## 2014-12-22 DIAGNOSIS — Z452 Encounter for adjustment and management of vascular access device: Secondary | ICD-10-CM

## 2014-12-22 DIAGNOSIS — C189 Malignant neoplasm of colon, unspecified: Secondary | ICD-10-CM | POA: Diagnosis present

## 2014-12-22 DIAGNOSIS — Z95828 Presence of other vascular implants and grafts: Secondary | ICD-10-CM

## 2014-12-22 HISTORY — PX: PORTACATH PLACEMENT: SHX2246

## 2014-12-22 LAB — CBC
HCT: 35 % — ABNORMAL LOW (ref 36.0–46.0)
Hemoglobin: 10.4 g/dL — ABNORMAL LOW (ref 12.0–15.0)
MCH: 21.6 pg — ABNORMAL LOW (ref 26.0–34.0)
MCHC: 29.7 g/dL — ABNORMAL LOW (ref 30.0–36.0)
MCV: 72.8 fL — ABNORMAL LOW (ref 78.0–100.0)
Platelets: 403 10*3/uL — ABNORMAL HIGH (ref 150–400)
RBC: 4.81 MIL/uL (ref 3.87–5.11)
RDW: 18 % — ABNORMAL HIGH (ref 11.5–15.5)
WBC: 6.7 10*3/uL (ref 4.0–10.5)

## 2014-12-22 SURGERY — INSERTION, TUNNELED CENTRAL VENOUS DEVICE, WITH PORT
Anesthesia: Monitor Anesthesia Care | Site: Chest | Laterality: Left

## 2014-12-22 MED ORDER — 0.9 % SODIUM CHLORIDE (POUR BTL) OPTIME
TOPICAL | Status: DC | PRN
  Administered 2014-12-22: 1000 mL

## 2014-12-22 MED ORDER — FENTANYL CITRATE 0.05 MG/ML IJ SOLN
INTRAMUSCULAR | Status: DC | PRN
  Administered 2014-12-22: 25 ug via INTRAVENOUS
  Administered 2014-12-22: 50 ug via INTRAVENOUS
  Administered 2014-12-22 (×3): 25 ug via INTRAVENOUS

## 2014-12-22 MED ORDER — LACTATED RINGERS IV SOLN
INTRAVENOUS | Status: DC
  Administered 2014-12-22: 1000 mL via INTRAVENOUS

## 2014-12-22 MED ORDER — DEXAMETHASONE SODIUM PHOSPHATE 10 MG/ML IJ SOLN
INTRAMUSCULAR | Status: DC | PRN
  Administered 2014-12-22: 10 mg via INTRAVENOUS

## 2014-12-22 MED ORDER — FENTANYL CITRATE 0.05 MG/ML IJ SOLN
INTRAMUSCULAR | Status: AC
  Filled 2014-12-22: qty 5

## 2014-12-22 MED ORDER — CEFAZOLIN SODIUM-DEXTROSE 2-3 GM-% IV SOLR
2.0000 g | INTRAVENOUS | Status: AC
  Administered 2014-12-22: 2 g via INTRAVENOUS

## 2014-12-22 MED ORDER — PROPOFOL 10 MG/ML IV BOLUS
INTRAVENOUS | Status: AC
  Filled 2014-12-22: qty 20

## 2014-12-22 MED ORDER — BUPIVACAINE HCL 0.25 % IJ SOLN
INTRAMUSCULAR | Status: DC | PRN
  Administered 2014-12-22: 11 mL

## 2014-12-22 MED ORDER — HEPARIN SOD (PORK) LOCK FLUSH 100 UNIT/ML IV SOLN
INTRAVENOUS | Status: DC | PRN
  Administered 2014-12-22: 500 [IU]

## 2014-12-22 MED ORDER — CHLORHEXIDINE GLUCONATE 4 % EX LIQD: 1.0000 "application " | Freq: Once | CUTANEOUS | Status: DC

## 2014-12-22 MED ORDER — HYDROMORPHONE HCL 1 MG/ML IJ SOLN
0.2500 mg | INTRAMUSCULAR | Status: DC | PRN
  Administered 2014-12-22 (×2): 0.5 mg via INTRAVENOUS

## 2014-12-22 MED ORDER — ONDANSETRON HCL 4 MG/2ML IJ SOLN
INTRAMUSCULAR | Status: AC
  Filled 2014-12-22: qty 2

## 2014-12-22 MED ORDER — HYDROMORPHONE HCL 1 MG/ML IJ SOLN
INTRAMUSCULAR | Status: AC
  Filled 2014-12-22: qty 1

## 2014-12-22 MED ORDER — MIDAZOLAM HCL 2 MG/2ML IJ SOLN
INTRAMUSCULAR | Status: AC
  Filled 2014-12-22: qty 2

## 2014-12-22 MED ORDER — OXYCODONE-ACETAMINOPHEN 5-325 MG/5ML PO SOLN: 5.0000 mL | ORAL | Status: DC | PRN

## 2014-12-22 MED ORDER — BUPIVACAINE HCL (PF) 0.25 % IJ SOLN
INTRAMUSCULAR | Status: AC
  Filled 2014-12-22: qty 30

## 2014-12-22 MED ORDER — SODIUM CHLORIDE 0.9 % IR SOLN
Freq: Once | Status: DC
  Filled 2014-12-22: qty 1.2

## 2014-12-22 MED ORDER — DEXAMETHASONE SODIUM PHOSPHATE 10 MG/ML IJ SOLN
INTRAMUSCULAR | Status: AC
  Filled 2014-12-22: qty 1

## 2014-12-22 MED ORDER — ONDANSETRON HCL 4 MG/2ML IJ SOLN
INTRAMUSCULAR | Status: DC | PRN
  Administered 2014-12-22: 4 mg via INTRAVENOUS

## 2014-12-22 MED ORDER — CEFAZOLIN SODIUM-DEXTROSE 2-3 GM-% IV SOLR
INTRAVENOUS | Status: AC
  Filled 2014-12-22: qty 50

## 2014-12-22 MED ORDER — PROPOFOL 10 MG/ML IV BOLUS
INTRAVENOUS | Status: DC | PRN
  Administered 2014-12-22: 150 mg via INTRAVENOUS

## 2014-12-22 MED ORDER — HEPARIN SOD (PORK) LOCK FLUSH 100 UNIT/ML IV SOLN
INTRAVENOUS | Status: AC
  Filled 2014-12-22: qty 5

## 2014-12-22 MED ORDER — MIDAZOLAM HCL 5 MG/5ML IJ SOLN
INTRAMUSCULAR | Status: DC | PRN
  Administered 2014-12-22: 2 mg via INTRAVENOUS

## 2014-12-22 SURGICAL SUPPLY — 35 items
APL SKNCLS STERI-STRIP NONHPOA (GAUZE/BANDAGES/DRESSINGS) ×1
BAG DECANTER FOR FLEXI CONT (MISCELLANEOUS) ×2 IMPLANT
BENZOIN TINCTURE PRP APPL 2/3 (GAUZE/BANDAGES/DRESSINGS) ×1 IMPLANT
BLADE SURG SZ11 CARB STEEL (BLADE) ×2 IMPLANT
CHLORAPREP W/TINT 26ML (MISCELLANEOUS) ×2 IMPLANT
DECANTER SPIKE VIAL GLASS SM (MISCELLANEOUS) ×2 IMPLANT
DRAPE C-ARM 42X120 X-RAY (DRAPES) ×2 IMPLANT
DRAPE LAPAROTOMY TRNSV 102X78 (DRAPE) ×2 IMPLANT
ELECT REM PT RETURN 9FT ADLT (ELECTROSURGICAL) ×2
ELECTRODE REM PT RTRN 9FT ADLT (ELECTROSURGICAL) ×1 IMPLANT
GAUZE SPONGE 4X4 12PLY STRL (GAUZE/BANDAGES/DRESSINGS) ×1 IMPLANT
GAUZE SPONGE 4X4 16PLY XRAY LF (GAUZE/BANDAGES/DRESSINGS) ×2 IMPLANT
GLOVE BIO SURGEON STRL SZ 6.5 (GLOVE) ×2 IMPLANT
GLOVE BIOGEL PI IND STRL 7.0 (GLOVE) ×1 IMPLANT
GLOVE BIOGEL PI INDICATOR 7.0 (GLOVE) ×1
GOWN STRL REUS W/TWL 2XL LVL3 (GOWN DISPOSABLE) ×2 IMPLANT
GOWN STRL REUS W/TWL XL LVL3 (GOWN DISPOSABLE) ×2 IMPLANT
KIT BASIN OR (CUSTOM PROCEDURE TRAY) ×2 IMPLANT
KIT PORT POWER 8FR ISP CVUE (Catheter) ×1 IMPLANT
KIT POWER CATH 8FR (Catheter) ×1 IMPLANT
LIQUID BAND (GAUZE/BANDAGES/DRESSINGS) ×1 IMPLANT
NDL HYPO 25X1 1.5 SAFETY (NEEDLE) ×1 IMPLANT
NEEDLE HYPO 25X1 1.5 SAFETY (NEEDLE) ×2 IMPLANT
PACK BASIC VI WITH GOWN DISP (CUSTOM PROCEDURE TRAY) ×2 IMPLANT
PENCIL BUTTON HOLSTER BLD 10FT (ELECTRODE) ×2 IMPLANT
STRIP CLOSURE SKIN 1/2X4 (GAUZE/BANDAGES/DRESSINGS) ×1 IMPLANT
SUT PROLENE 2 0 SH DA (SUTURE) ×3 IMPLANT
SUT VIC AB 3-0 SH 27 (SUTURE) ×4
SUT VIC AB 3-0 SH 27XBRD (SUTURE) ×1 IMPLANT
SUT VIC AB 4-0 PS2 18 (SUTURE) ×2 IMPLANT
SYR CONTROL 10ML LL (SYRINGE) ×2 IMPLANT
SYRINGE 10CC LL (SYRINGE) ×2 IMPLANT
TAPE CLOTH SURG 4X10 WHT LF (GAUZE/BANDAGES/DRESSINGS) ×1 IMPLANT
TOWEL OR 17X26 10 PK STRL BLUE (TOWEL DISPOSABLE) ×2 IMPLANT
TOWEL OR NON WOVEN STRL DISP B (DISPOSABLE) ×2 IMPLANT

## 2014-12-22 NOTE — Transfer of Care (Signed)
Immediate Anesthesia Transfer of Care Note  Patient: Jenny [name]  Procedure(s) Performed: Procedure(s): INSERTION PORT-A-CATH LEFT SUBCLAVIAN (Left)  Patient Location: PACU  Anesthesia Type:General  Level of Consciousness: awake, alert  and patient cooperative  Airway & Oxygen Therapy: Patient Spontanous Breathing and Patient connected to face mask oxygen  Post-op Assessment: Report given to PACU RN and Post -op Vital signs reviewed and stable  Post vital signs: Reviewed and stable  Complications: No apparent anesthesia complications

## 2014-12-22 NOTE — Op Note (Signed)
12/22/2014  12:23 PM  PATIENT:  Jenny [name]  53 y.o. female  Patient Care Team: Lind Covert, MD as PCP - General Juanita Craver, MD as Consulting Physician (Gastroenterology) [name], MD as Consulting Physician (General Surgery) Truitt Merle, MD as Consulting Physician (Hematology)  PRE-OPERATIVE DIAGNOSIS:  STAGE 3 COLON CANCER   POST-OPERATIVE DIAGNOSIS: STAGE 3 COLON CANCER  PROCEDURE: INSERTION OF PORT-A-CATH   Surgeon(s): [name], MD  ANESTHESIA:   local and general  EBL:  Total I/O In: 500 [I.V.:500] Out: -   DISPOSITION OF SPECIMEN:  N/A  COUNTS:  YES  PLAN OF CARE: Discharge to home after PACU  PATIENT DISPOSITION:  PACU - hemodynamically stable.  INDICATION: Patient with need for IV chemotherapy for stage 3 colon cancer. Port-A-Cath placement was requested.   Use of a central venous catheter for intravenous therapy was discussed. Technique of catheter placement using ultrasound and fluoroscopy guidance was discussed. Risks such as bleeding, infection, pneumothorax, catheter occlusion, reoperation, and other risks were discussed. I noted a good likelihood this will help address the problem. Questions were answered. The patient expressed understanding & wishes to proceed.   Findings: Normal-appearing anatomy.   8 [place] power port. It goes through the left subclavian vein   Procedure: Informed consent was confirmed. Patient was brought the operating room and positioned supine. Arms were tucked. The patient underwent deep sedation. Neck and chest were clipped and prepped and draped in a sterile fashion. A surgical timeout confirmed our plan. I placed a field block of local anesthesia on the chest.  I entered into the left subclavian on the first venipuncture using standard landmarks. Non-pulsatile blood was returned. Wire was easily passed into the inferior vena cava and confirmed by fluoroscopy. I confirmed placement of the wire in the right side of  the chest.  I made an incision in the lateral infraclavicular area and made a subcutaneous pocket. I used a dilator on the wire using Seldinger technique to dilate the tract under fluoroscopy. I placed the catheter into the sheath. I then peeled away the dilator sheath. I tunneled the power port from the puncture site to the chest pocket. I cut the catheter to appropriate length and attached it to the port using the plastic connector. The port was placed into the pocket and secured to the left anterior chest wall using 2-0 Prolene interrupted stitches x2. Catheter flushed well.  Fluoroscopy confirmed the tip in the distal SVC. Catheter aspirated and flushed well. On final fluoro reevaluation the tip seen to be in good position in the distal SVC.   I closed the wounds using 3-0 Vicryl interrupted sutures for the pocket and 4-0 Monocryl stitch was used to close the skin.  I did a final flush with concentrated heparin and at that point was unable to draw back, but the catheter did flush. I evaluated the entire catheter and access system. I could not find any defects. We shot another film and there was normal placement without any kinking of the catheter.  I still could not get it to withdrawal and it would not flush well either. I decided to replace the entire system, as I was unsure if it will work long-term. I removed the catheter and place the patient back in Trendelenburg position. I accessed the vein on the first venipuncture again. I was unable to place a wire at that point. I then had to re-access the vein with a second venipuncture. The wire then flowed easily into the  vein. I adjusted the wire until is going down the SVC. I then placed the introducer under direct fluoroscopy again. I placed the catheter through the introducer after removing the wire. After that I tunneled the catheter in a deeper position using the tunneler and brought it out through the pocket. This was then connected to the port and  secured into place using two 2-0 Prolene sutures.   At this point the port continued to flush well. I close the pocket once again with 3-0 Vicryl sutures and close the access site with the same. I placed the final concentrated heparin flush and it also drew and flushed well at this point. I closed the skin using a running 4-0 Vicryl suture. Steri-Strips were used to close the incision and a sterile dressing was applied.  CXR will be performed in PACU. Patient should go home later today. Catheter is okay to use.

## 2014-12-22 NOTE — Discharge Instructions (Addendum)
PORT-A-CATH: POST OP INSTRUCTIONS  Always review your discharge instruction sheet given to you by the facility where your surgery was performed.   1. A prescription for pain medication may be given to you upon discharge. Take your pain medication as prescribed, if needed. If narcotic pain medicine is not needed, then you make take acetaminophen (Tylenol) or ibuprofen (Advil) as needed.  2. Take your usually prescribed medications unless otherwise directed. 3. If you need a refill on your pain medication, please contact our office. All narcotic pain medicine now requires a paper prescription.  Phoned in and fax refills are no longer allowed by law.  Prescriptions will not be filled after 5 pm or on weekends.  4. You should follow a light diet for the remainder of the day after your procedure. 5. Most patients will experience some mild swelling and/or bruising in the area of the incision. It may take several days to resolve. 6. It is common to experience some constipation if taking pain medication after surgery. Increasing fluid intake and taking a stool softener (such as Colace) will usually help or prevent this problem from occurring. A mild laxative (Milk of Magnesia or Miralax) should be taken according to package directions if there are no bowel movements after 48 hours.  7. Unless discharge instructions indicate otherwise, you may remove your bandages 48 hours after surgery, and you may shower at that time. You may have steri-strips (small white skin tapes) in place directly over the incision.  These strips should be left on the skin for 7-10 days.  8. ACTIVITIES:  Limit activity involving your arms for the next 72 hours. Do no strenuous exercise or activity for 1 week. You may drive when you are no longer taking prescription pain medication, you can comfortably wear a seatbelt, and you can maneuver your car. 10.You may need to see your doctor in the office for a follow-up appointment.  Please        check with your doctor.  11.When you receive a new Port-a-Cath, you will get a product guide and        ID card.  Please keep them in case you need them.  WHEN TO CALL YOUR DOCTOR [phone]): 1. Fever over 101.0 2. Chills 3. Continued bleeding from incision 4. Increased redness and tenderness at the site 5. Shortness of breath, difficulty breathing   The clinic staff is available to answer your questions during regular business hours. Please dont hesitate to call and ask to speak to one of the nurses or medical assistants for clinical concerns. If you have a medical emergency, go to the nearest emergency room or call 911.  A surgeon from [facility] is always on call at the hospital.     For further information, please visit [email or web address]     General Anesthesia, Care After Refer to this sheet in the next few weeks. These instructions provide you with information on caring for yourself after your procedure. Your health care provider may also give you more specific instructions. Your treatment has been planned according to current medical practices, but problems sometimes occur. Call your health care provider if you have any problems or questions after your procedure. WHAT TO EXPECT AFTER THE PROCEDURE After the procedure, it is typical to experience:  Sleepiness.  Nausea and vomiting. HOME CARE INSTRUCTIONS  For the first 24 hours after general anesthesia:  Have a responsible person with you.  Do not drive a car. If you are alone, do not  take public transportation.  Do not drink alcohol.  Do not take medicine that has not been prescribed by your health care provider.  Do not sign important papers or make important decisions.  You may resume a normal diet and activities as directed by your health care provider.  Change bandages (dressings) as directed.  If you have questions or problems that seem related to general anesthesia, call the  hospital and ask for the anesthetist or anesthesiologist on call. SEEK MEDICAL CARE IF:  You have nausea and vomiting that continue the day after anesthesia.  You develop a rash. SEEK IMMEDIATE MEDICAL CARE IF:   You have difficulty breathing.  You have chest pain.  You have any allergic problems. Document Released: 03/17/2001 Document Revised: 12/14/2013 Document Reviewed: 06/24/2013 [facility] Patient Information 2015 [place], [place]. This information is not intended to replace advice given to you by your health care provider. Make sure you discuss any questions you have with your health care provider.

## 2014-12-22 NOTE — Anesthesia Postprocedure Evaluation (Signed)
  Anesthesia Post-op Note  Patient: Jenny [name]  Procedure(s) Performed: Procedure(s): INSERTION PORT-A-CATH LEFT SUBCLAVIAN (Left)  Patient Location: PACU  Anesthesia Type:General  Level of Consciousness: awake and alert   Airway and Oxygen Therapy: Patient Spontanous Breathing and Patient connected to nasal cannula oxygen  Post-op Pain: mild  Post-op Assessment: Post-op Vital signs reviewed, Patient's Cardiovascular Status Stable and Patent Airway  Post-op Vital Signs: Reviewed and stable  Last Vitals:  Filed Vitals:   12/22/14 1236  BP: 121/72  Pulse: 76  Temp: 36.7 C  Resp: 15    Complications: No apparent anesthesia complications

## 2014-12-22 NOTE — Anesthesia Preprocedure Evaluation (Addendum)
Anesthesia Evaluation  Patient identified by MRN, date of birth, ID band  Reviewed: Allergy & Precautions, H&P , NPO status , Patient's Chart, lab work & pertinent test results, reviewed documented beta blocker date and time   Airway Mallampati: II       Dental  (+) Teeth Intact   Pulmonary neg pulmonary ROS,  breath sounds clear to auscultation        Cardiovascular Rhythm:Regular     Neuro/Psych negative neurological ROS     GI/Hepatic   Endo/Other  probale pre- diabetic  Renal/GU      Musculoskeletal   Abdominal (+) + obese,   Peds  Hematology  (+) anemia , H/H 10/35   Anesthesia Other Findings   Reproductive/Obstetrics                             Anesthesia Physical Anesthesia Plan  ASA: II  Anesthesia Plan: General   Post-op Pain Management:    Induction: Intravenous  Airway Management Planned: LMA  Additional Equipment:   Intra-op Plan:   Post-operative Plan: Extubation in OR  Informed Consent: I have reviewed the patients History and Physical, chart, labs and discussed the procedure including the risks, benefits and alternatives for the proposed anesthesia with the patient or authorized representative who has indicated his/her understanding and acceptance.     Plan Discussed with:   Anesthesia Plan Comments:         Anesthesia Quick Evaluation

## 2014-12-22 NOTE — Anesthesia Postprocedure Evaluation (Signed)
  Anesthesia Post-op Note  Patient: Jenny [name]  Procedure(s) Performed: Procedure(s): INSERTION PORT-A-CATH LEFT SUBCLAVIAN (Left)  Patient Location: PACU  Anesthesia Type:General  Level of Consciousness: awake and alert   Airway and Oxygen Therapy: Patient Spontanous Breathing and Patient connected to nasal cannula oxygen  Post-op Pain: none  Post-op Assessment: Post-op Vital signs reviewed, Patient's Cardiovascular Status Stable, Respiratory Function Stable and Patent Airway  Post-op Vital Signs: Reviewed and stable  Last Vitals:  Filed Vitals:   12/22/14 1442  BP: 117/81  Pulse: 78  Temp:   Resp: 20    Complications: No apparent anesthesia complications

## 2014-12-22 NOTE — H&P (Signed)
The patient is a 52 year old female who presents with sigmoid colon cancer. She is s/p sigmoidectomy and has recovered mostly from this.   Other Problems [name]; 10/17/2014 2:26 PM) Colon Cancer  Past Surgical History [name]; 10/17/2014 2:26 PM) Cesarean Section - 1 Colon Polyp Removal - Colonoscopy Spinal Surgery - Neck Tonsillectomy  Diagnostic Studies History [name]; 10/17/2014 2:26 PM) Colonoscopy within last year Pap Smear >5 years ago  Allergies [name]; 10/17/2014 2:27 PM) No Known Drug Allergies10/26/2015  Medication History [name]; 10/17/2014 2:28 PM) Gabapentin (100MG  Capsule, Oral) Active. Tylenol Extra Strength (500MG  Tablet, Oral) Active. MiraLax (Oral) Active. Ferrous Sulfate CR (325MG  Tablet ER, Oral) Active.  Social History [name]; 10/17/2014 2:26 PM) Caffeine use Carbonated beverages, Coffee, Tea. No alcohol use No drug use Tobacco use Never smoker.  Family History [name]; 10/17/2014 2:26 PM) Cancer Brother. Heart Disease Mother. Hypertension Mother. Thyroid problems Mother.  Pregnancy / Birth History [name]; 10/17/2014 2:26 PM) Age at menarche 4 years. Gravida 3 Irregular periods Maternal age 37-20 Para 3  Review of Systems [name]; 10/17/2014 2:26 PM) General Present- Chills and Fatigue. Not Present- Appetite Loss, Fever, Night Sweats, Weight Gain and Weight Loss. Skin Not Present- Change in Wart/Mole, Dryness, Hives, Jaundice, New Lesions, Non-Healing Wounds, Rash and Ulcer. HEENT Not Present- Earache, Hearing Loss, Hoarseness, Nose Bleed, Oral Ulcers, Ringing in the Ears, Seasonal Allergies, Sinus Pain, Sore Throat, Visual Disturbances, Wears glasses/contact lenses and Yellow Eyes. Respiratory Present- Snoring. Not Present- Bloody sputum, Chronic Cough, Difficulty Breathing and Wheezing. Breast Not Present- Breast Mass, Breast Pain,  Nipple Discharge and Skin Changes. Cardiovascular Not Present- Chest Pain, Difficulty Breathing Lying Down, Leg Cramps, Palpitations, Rapid Heart Rate, Shortness of Breath and Swelling of Extremities. Gastrointestinal Present- Abdominal Pain and Bloody Stool. Not Present- Bloating, Change in Bowel Habits, Chronic diarrhea, Constipation, Difficulty Swallowing, Excessive gas, Gets full quickly at meals, Hemorrhoids, Indigestion, Nausea, Rectal Pain and Vomiting. Female Genitourinary Not Present- Frequency, Nocturia, Painful Urination, Pelvic Pain and Urgency. Musculoskeletal Not Present- Back Pain, Joint Pain, Joint Stiffness, Muscle Pain, Muscle Weakness and Swelling of Extremities. Neurological Present- Tingling. Not Present- Decreased Memory, Fainting, Headaches, Numbness, Seizures, Tremor, Trouble walking and Weakness. Psychiatric Present- Change in Sleep Pattern. Not Present- Anxiety, Bipolar, Depression, Fearful and Frequent crying. Endocrine Not Present- Cold Intolerance, Excessive Hunger, Hair Changes, Heat Intolerance, Hot flashes and New Diabetes. Hematology Not Present- Easy Bruising, Excessive bleeding, Gland problems, HIV and Persistent Infections.   BP 117/86 mmHg  Pulse 88  Temp(Src) 98 F (36.7 C) (Oral)  Resp 18  Ht 5\' 3"  (1.6 m)  Wt 245 lb 6.4 oz (111.313 kg)  BMI 43.48 kg/m2  SpO2 100%   Physical Exam [name] MD; 69/62/9528 4:11 PM) General Mental Status-Alert. General Appearance-Not in acute distress, Not Sickly. Orientation-Oriented X3. Hydration-Well hydrated. Voice-Normal.  Integumentary Global Assessment Upon inspection and palpation of skin surfaces of the - Axillae: non-tender, no inflammation or ulceration, no drainage. and Distribution of scalp and body hair is normal. General Characteristics Temperature - normal warmth is noted.  Head and Neck Head-normocephalic, atraumatic with no lesions or palpable masses. Face Global Assessment  - atraumatic, no absence of expression. Neck Global Assessment - no abnormal movements, no bruit auscultated on the right, no bruit auscultated on the left, no decreased range of motion, non-tender. Trachea-midline. Thyroid Gland Characteristics - non-tender.  ENMT Ears Pinna - Left - no drainage observed, no generalized tenderness observed. Right - no drainage observed,  no generalized tenderness observed. Nose and Sinuses External Inspection of the Nose - no destructive lesion observed. Inspection of the nares - Left - quiet respiration. Right - quiet respiration. Mouth and Throat Lips - Upper Lip - no fissures observed, no pallor noted. Lower Lip - no fissures observed, no pallor noted. Nasopharynx - no discharge present. Oral Cavity/Oropharynx - Tongue - no dryness observed. Oral Mucosa - no cyanosis observed. Hypopharynx - no evidence of airway distress observed.  Chest and Lung Exam Inspection Movements - Normal and Symmetrical. Accessory muscles - No use of accessory muscles in breathing. Palpation Palpation of the chest reveals - Non-tender. Auscultation Breath sounds - Normal and Clear.  Cardiovascular Auscultation Rhythm - Regular. Murmurs & Other Heart Sounds - Auscultation of the heart reveals - No Murmurs and No Systolic Clicks.  Abdomen Inspection Inspection of the abdomen reveals - No Visible peristalsis and No Abnormal pulsations. Note: lower midline scar. Palpation/Percussion Palpation and Percussion of the abdomen reveal - Soft, Non Tender, No Rebound tenderness, No Rigidity (guarding) and No Cutaneous hyperesthesia.  Peripheral Vascular Upper Extremity Inspection - Left - No Cyanotic nailbeds, Not Ischemic. Right - No Cyanotic nailbeds, Not Ischemic.  Neurologic Neurologic evaluation reveals -normal attention span and ability to concentrate, able to name objects and repeat phrases. Appropriate fund of knowledge , normal sensation and normal  coordination. Mental Status Affect - not angry, not paranoid. Cranial Nerves-Normal Bilaterally. Gait-Normal.  Neuropsychiatric Mental status exam performed with findings of-able to articulate well with normal speech/language, rate, volume and coherence, thought content normal with ability to perform basic computations and apply abstract reasoning and no evidence of hallucinations, delusions, obsessions or homicidal/suicidal ideation.  Musculoskeletal Global Assessment Spine, Ribs and Pelvis - no instability, subluxation or laxity. Right Upper Extremity - no instability, subluxation or laxity.    Assessment & Plan Pt with Stage 3 colon cancer.  She has decided to proceed with adjuvant IV chemotherapy.  We will place a port for this.  Patient appears to understand the risks of surgery, which include bleeding, infection, pneumothorax and device malfunction.

## 2014-12-26 ENCOUNTER — Encounter (HOSPITAL_COMMUNITY): Payer: Self-pay | Admitting: General Surgery

## 2014-12-26 ENCOUNTER — Encounter: Payer: Self-pay | Admitting: Hematology

## 2014-12-26 ENCOUNTER — Other Ambulatory Visit: Payer: Self-pay | Admitting: Hematology

## 2014-12-26 ENCOUNTER — Ambulatory Visit: Payer: BC Managed Care – PPO

## 2014-12-26 ENCOUNTER — Telehealth: Payer: Self-pay | Admitting: *Deleted

## 2014-12-26 DIAGNOSIS — C189 Malignant neoplasm of colon, unspecified: Secondary | ICD-10-CM

## 2014-12-26 MED ORDER — LIDOCAINE-PRILOCAINE 2.5-2.5 % EX CREA: 1.0000 "application " | TOPICAL_CREAM | CUTANEOUS | Status: DC | PRN

## 2014-12-26 NOTE — Telephone Encounter (Signed)
Faxed dictated letter to [facility] for pt to remain out of work until she completes chemo to [phone].

## 2014-12-27 ENCOUNTER — Telehealth: Payer: Self-pay | Admitting: Family Medicine

## 2014-12-27 ENCOUNTER — Other Ambulatory Visit: Payer: Self-pay | Admitting: Hematology

## 2014-12-27 ENCOUNTER — Encounter: Payer: Self-pay | Admitting: Family Medicine

## 2014-12-27 ENCOUNTER — Other Ambulatory Visit: Payer: Self-pay | Admitting: *Deleted

## 2014-12-27 NOTE — Telephone Encounter (Signed)
Called patient to dscuss thryoid [name].  See letter for details.  She is agreeable

## 2014-12-28 ENCOUNTER — Ambulatory Visit (HOSPITAL_BASED_OUTPATIENT_CLINIC_OR_DEPARTMENT_OTHER): Payer: BC Managed Care – PPO

## 2014-12-28 ENCOUNTER — Other Ambulatory Visit: Payer: Self-pay | Admitting: *Deleted

## 2014-12-28 ENCOUNTER — Ambulatory Visit (HOSPITAL_BASED_OUTPATIENT_CLINIC_OR_DEPARTMENT_OTHER): Payer: BC Managed Care – PPO | Admitting: Hematology

## 2014-12-28 ENCOUNTER — Encounter: Payer: Self-pay | Admitting: Hematology

## 2014-12-28 DIAGNOSIS — C189 Malignant neoplasm of colon, unspecified: Secondary | ICD-10-CM

## 2014-12-28 DIAGNOSIS — C187 Malignant neoplasm of sigmoid colon: Secondary | ICD-10-CM

## 2014-12-28 DIAGNOSIS — Z5111 Encounter for antineoplastic chemotherapy: Secondary | ICD-10-CM

## 2014-12-28 DIAGNOSIS — D509 Iron deficiency anemia, unspecified: Secondary | ICD-10-CM

## 2014-12-28 MED ORDER — OXYCODONE-ACETAMINOPHEN 5-325 MG PO TABS
2.0000 | ORAL_TABLET | Freq: Once | ORAL | Status: AC
  Administered 2014-12-28: 2 via ORAL

## 2014-12-28 MED ORDER — DEXTROSE 5 % IV SOLN
Freq: Once | INTRAVENOUS | Status: AC
  Administered 2014-12-28: 11:00:00 via INTRAVENOUS

## 2014-12-28 MED ORDER — ONDANSETRON HCL 8 MG PO TABS: ORAL_TABLET | ORAL | Status: DC

## 2014-12-28 MED ORDER — DEXAMETHASONE SODIUM PHOSPHATE 10 MG/ML IJ SOLN
INTRAMUSCULAR | Status: AC
  Filled 2014-12-28: qty 1

## 2014-12-28 MED ORDER — ONDANSETRON 8 MG/50ML IVPB (CHCC)
8.0000 mg | Freq: Once | INTRAVENOUS | Status: AC
  Administered 2014-12-28: 8 mg via INTRAVENOUS

## 2014-12-28 MED ORDER — OXYCODONE-ACETAMINOPHEN 5-325 MG PO TABS
ORAL_TABLET | ORAL | Status: AC
  Filled 2014-12-28: qty 1

## 2014-12-28 MED ORDER — ONDANSETRON 8 MG/NS 50 ML IVPB
INTRAVENOUS | Status: AC
  Filled 2014-12-28: qty 8

## 2014-12-28 MED ORDER — LEUCOVORIN CALCIUM INJECTION 350 MG
400.0000 mg/m2 | Freq: Once | INTRAVENOUS | Status: AC
  Administered 2014-12-28: 888 mg via INTRAVENOUS
  Filled 2014-12-28: qty 44.4

## 2014-12-28 MED ORDER — OXALIPLATIN CHEMO INJECTION 100 MG/20ML
85.0000 mg/m2 | Freq: Once | INTRAVENOUS | Status: AC
  Administered 2014-12-28: 190 mg via INTRAVENOUS
  Filled 2014-12-28: qty 38

## 2014-12-28 MED ORDER — DEXAMETHASONE SODIUM PHOSPHATE 10 MG/ML IJ SOLN
10.0000 mg | Freq: Once | INTRAMUSCULAR | Status: AC
  Administered 2014-12-28: 10 mg via INTRAVENOUS

## 2014-12-28 MED ORDER — SODIUM CHLORIDE 0.9 % IV SOLN
2400.0000 mg/m2 | INTRAVENOUS | Status: DC
  Administered 2014-12-28: 5350 mg via INTRAVENOUS
  Filled 2014-12-28: qty 107

## 2014-12-28 MED ORDER — FLUOROURACIL CHEMO INJECTION 2.5 GM/50ML
400.0000 mg/m2 | Freq: Once | INTRAVENOUS | Status: AC
  Administered 2014-12-28: 900 mg via INTRAVENOUS
  Filled 2014-12-28: qty 18

## 2014-12-28 NOTE — Progress Notes (Signed)
Reports pain relief and ready to proceed with access of port.

## 2014-12-28 NOTE — Progress Notes (Signed)
[place]  [phone] Fax:([phone] [name] Note   Patient Care Team: Lind Covert, MD as PCP - General Juanita Craver, MD as Consulting Physician (Gastroenterology) [name], MD as Consulting Physician (General Surgery) Truitt Merle, MD as Consulting Physician (Hematology) 12/28/2014  CHIEF COMPLAINTS Follow up colon cancer   DIAGNOSIS:  Colon cancer   Staging form: Colon and Rectum, AJCC 7th Edition     Clinical: Stage IIIB (T3, N1a, M0) - Signed by Truitt Merle, MD on 12/28/2014     Colon cancer   10/10/2014 Imaging CT abdomen and pelvis: Eccentric wall thickening along the sigmoid colon, worrisome for early colonic adenocarcinoma. No findings suspicious for metastatic disease. CT chest (-)      11/14/2014 Initial Diagnosis Colon cancer   11/14/2014 Pathologic Stage pT3pN1pMx, G2, tumor invading through the muscular propria into pericolonic fatty tissue.  LVI (-), PNI (-). 1/28 node positive, margins negative.    11/14/2014 Surgery sigmoid colon segmental resection, margins (-).     HISTORY OF INITIAL PRESENTING ILLNESS:  Jenny [name] 54 y.o. female without a significant past medical history, who is referred by her surgeon Dr. Marcello Moores to discuss adjuvant chemotherapy for her resected colon cancer.  She presented with abdominal pain for 8-9 month, which was related to position. She noticed bloody stool in Aug 2015. She was seen by PCP, and was referred to GI Dr. Collene Mares, and underwent colonoscopy which showed a mass at sigmoid colon (per patient, I do not have the report). Biopsy showed adenocarcinoma. She was then referred to surgeon Dr. Marcello Moores and underwent sigmoid colon segmentectomy on 11/14/2014.   INTERIM HISTORY: Jenny [name] returns for follow-up and the first cycle of adjuvant chemotherapy. She had a port placed by Dr. Marcello Moores week ago, still has some soreness at the port site. She actually had significant pain when the port  was accessed this morning and required a dose of Percocet. She otherwise has recovered well from surgery. She has good appetite, eating well, abdominal pain at the surgical site has most resolved. She still has some residual fatigue, but able to function well at home. She has not returned to work yet.  MEDICAL HISTORY:  Past Medical History  Diagnosis Date  . Enlarged thyroid   . Anemia   . Cancer     colon cancer     SURGICAL HISTORY: Past Surgical History  Procedure Laterality Date  . Tonsillectomy    . Cervical spine surgery  06/17/2012    C5-C7 ACDF  . Cesarean section    . Partial hysterectomy    . Portacath placement Left 12/22/2014    Procedure: INSERTION PORT-A-CATH LEFT SUBCLAVIAN;  Surgeon: [name], MD;  Location: [facility];  Service: General;  Laterality: Left;    SOCIAL HISTORY: History   Social History  . Marital Status: Married    Spouse Name: N/A    Number of Children: 2  . Years of Education: N/A   Occupational History  . Not on file.   Social History Main Topics  . Smoking status: Never Smoker   . Smokeless tobacco: Never Used  . Alcohol Use: No  . Drug Use: No  . Sexual Activity: Not on file    P2G2, one child was murdered. She is a Paramedic.   FAMILY HISTORY: Family History  Problem Relation Age of Onset  . Colon cancer Neg Hx   . Liver Cancer Brother        .  Hypertension Mother     ALLERGIES:  is allergic to other and shellfish allergy.  MEDICATIONS:  Current Outpatient Prescriptions  Medication Sig Dispense Refill  . diphenhydrAMINE (BENADRYL) 12.5 MG/5ML liquid Take 12.5-25 mg by mouth every 4 (four) hours as needed for itching or allergies.    . hydrocortisone cream 0.5 % Apply 1 application topically 2 (two) times daily as needed for itching.    . lidocaine-prilocaine (EMLA) cream Apply 1 application topically as needed. 30 g 0  . ondansetron (ZOFRAN) 8 MG tablet Take 1 by mouth twice a day for 2 days after  chemotherapy and then twice daily as needed 30 tablet 1  . oxyCODONE (ROXICODONE) 5 MG/5ML solution   0  . oxyCODONE-acetaminophen (PERCOCET/ROXICET) 5-325 MG per tablet     . oxyCODONE-acetaminophen (ROXICET) 5-325 MG/5ML solution Take 5 mLs by mouth every 4 (four) hours as needed for severe pain. 50 mL 0  . triamcinolone (KENALOG) 0.025 % ointment Apply 1 application topically 2 (two) times daily. 30 g 1   No current facility-administered medications for this visit.    REVIEW OF SYSTEMS:   Constitutional: Denies fevers, chills or abnormal night sweats Eyes: Denies blurriness of vision, double vision or watery eyes Ears, nose, mouth, throat, and face: Denies mucositis or sore throat Respiratory: Denies cough, dyspnea or wheezes Cardiovascular: Denies palpitation, chest discomfort or lower extremity swelling Gastrointestinal:  Denies nausea, heartburn or change in bowel habits Skin: Denies abnormal skin rashes Lymphatics: Denies new lymphadenopathy or easy bruising Neurological:Denies numbness, tingling or new weaknesses Behavioral/Psych: Mood is stable, no new changes  All other systems were reviewed with the patient and are negative.  PHYSICAL EXAMINATION: ECOG PERFORMANCE STATUS: 1 Vital sign was reviewed, normal.  GENERAL:alert, no distress and comfortable SKIN: skin color, texture, turgor are normal, no rashes or significant lesions EYES: normal, conjunctiva are pink and non-injected, sclera clear OROPHARYNX:no exudate, no erythema and lips, buccal mucosa, and tongue normal  NECK: supple, thyroid normal size, non-tender, without nodularity LYMPH:  no palpable lymphadenopathy in the cervical, axillary or inguinal LUNGS: clear to auscultation and percussion with normal breathing effort HEART: regular rate & rhythm and no murmurs and no lower extremity edema ABDOMEN:abdomen soft, non-tender and normal bowel sounds. Surgical wound has well-healed Musculoskeletal:no cyanosis of  digits and no clubbing  PSYCH: alert & oriented x 3 with fluent speech NEURO: no focal motor/sensory deficits  LABORATORY DATA:  I have reviewed the data as listed Lab Results  Component Value Date   WBC 6.7 12/22/2014   HGB 10.4* 12/22/2014   HCT 35.0* 12/22/2014   MCV 72.8* 12/22/2014   PLT 403* 12/22/2014    Recent Labs  05/12/14 1104  11/15/14 0435 11/16/14 0418 11/17/14 0454 12/05/14 1553  NA 138  < > 144 138 136* 138  K 4.9  < > 4.0 4.5 3.9 4.0  CL 101  < > 106 103 102  --   CO2 23  < > $R'24 27 26 23  'Qu$ GLUCOSE 123*  < > 132* 129* 132* 148*  BUN 10  < > $R'10 10 10 'Kx$ 10.2  CREATININE 0.70  < > 0.74 0.79 0.78 0.8  CALCIUM 9.6  < > 9.1 9.0 9.1 9.7  GFRNONAA >89  < > >90 >90 >90  --   GFRAA >89  < > >90 >90 >90  --   PROT 7.7  --   --   --   --  7.3  ALBUMIN 4.2  --   --   --   --  3.8  AST 24  --   --   --   --  10  ALT 19  --   --   --   --  17  ALKPHOS 69  --   --   --   --  59  BILITOT 0.4  --   --   --   --  0.29  < > = values in this interval not displayed.  PATHOLOGY REPORT 11/14/2014 Diagnosis(continued) 1. Colon, segmental resection for tumor, sigmoid - INVASIVE ADENOCARCINOMA, INVADING THROUGH THE MUSCULARIS PROPRIA INTO PERICOLONIC FATTY TISSUE. - ONE OF TWENTY-EIGHT LYMPH NODES, POSITIVE FOR METASTATIC CARCINOMA (1/28). - RESECTION MARGINS, NEGATIVE FOR ATYPIA OR MALIGNANCY - RESECTION MARGINS, NEGATIVE FOR ATYPIA OR MALIGNANCY. 2. Colon, resection margin (donut), colon final distal margin - BENIGN COLONIC MUCOSA, NO EVIDENCE OF MALIGNANCY. Microscopic Comment 1. COLON Specimen: Sigmoid colon Procedure: Segmental resection Tumor site: Sigmoid colon Specimen integrity: Intact Macroscopic intactness of mesorectum: N/A Macroscopic tumor perforation: No Invasive tumor: Maximum size: 2.7 cm, gross measurement Histologic type(s): Invasive adenocarcinoma Histologic grade and differentiation: G2: moderately differentiated/low grade Type of polyp tumor arose  from: Tubular adenoma Microscopic extension of invasive tumor: Invading through the muscularis propria into pericolonic fatty tissue. Lymph-Vascular invasion: Not identified Peri-neural invasion: Not identified Tumor deposit(s) (discontinuous extramural extension): N/A Resection margins: Negative Open margin: 7.1 cm Stapled margin: 5.5 cm Mesenteric margin (sigmoid and transverse): 9 cm Treatment effect (neo-adjuvant therapy): No Additional polyp(s): N/A Non-neoplastic findings: N/A Lymph nodes: number examined 28; number positive: 1 Pathologic Staging: pT3, pN1a, pMX Ancillary studies: MMR stains will be performed and an addendum report will follow. MSI testing will be performed at an outside institution and the results will be available in EPIC. (HCL:kh 11-15-14)  TUMOR MSI: stable (by PCR),  MMR: NORMAL   RADIOGRAPHIC STUDIES: I have personally reviewed the radiological images as listed and agreed with the findings in the report.  CT chest 10/25/2014 IMPRESSION: No CT findings for pulmonary metastatic disease. No mediastinal or hilar mass or adenopathy.  CT abdomen Pelvis w contrast 10/10/2014 Eccentric wall thickening along the sigmoid colon, likely corresponding to the finding on colonoscopy, worrisome for early colonic adenocarcinoma.  No findings suspicious for metastatic disease.  ASSESSMENT & PLAN:  54 year old African-American female, without significant past medical history, presents with abdominal pain and bloody bowel movement, anemia, was found to have a sigmoid adenocarcinoma, status post complete surgical resection.   1. Sigmoid colon adenocarcinoma, G2, pT3pN1aM0, stage IIIB, MSI stable  -She has recovered well from her surgery. I recommend adjuvant chemotherapy with FOLFOX, side effects include but not limited to, fatigue, cytopenia, nausea vomiting, diarrhea, mouth sores, hearing loss, cold sensitivity and peripheral neuropathy, risk of infection, need for  blood transfusion, heart attack, were explained to patient in great details. she agrees with chemotherapy consent was obtained today. -She will start today. -We also discussed the 5 year surveillance plan after she completes the adjuvant therapy.  2. Abdominal pain, secondary to surgery -Much improved. She still takes Percocet occasionally.  3. Microcytic anemia, likely iron deficient anemia secondary to colon cancer -Her serum iron and saturation were low. Likely from her colon cancer relates his GI bleeding. -She is not able to tolerate by mouth iron pill due to her recent surgery. I will give her 1 dose of IV iron Feraheme.  Plan -Return to clinic next week for toxicity check up. Lab before her visit. -Feraheme 510 mg infusion in 2 days.  All questions were answered.  The patient knows to call the clinic with any problems, questions or concerns.  I spent 20 minutes counseling the patient face to face. The total time spent in the appointment was 25 minutes and more than 50% was on counseling.     Truitt Merle, MD 12/28/2014 9:08 PM

## 2014-12-28 NOTE — Patient Instructions (Addendum)
[place] Discharge Instructions for Patients Receiving Chemotherapy  Today you received the following chemotherapy agents Oxaliplatin, leucovorin, 5FU.  To help prevent nausea and vomiting after your treatment, we encourage you to take your nausea medication Zofran 8 mg twice a day for two days beginning day after chemotherapy and then twice a day as needed.   If you develop nausea and vomiting that is not controlled by your nausea medication, call the clinic.   BELOW ARE SYMPTOMS THAT SHOULD BE REPORTED IMMEDIATELY:  *FEVER GREATER THAN 100.5 F  *CHILLS WITH OR WITHOUT FEVER  NAUSEA AND VOMITING THAT IS NOT CONTROLLED WITH YOUR NAUSEA MEDICATION  *UNUSUAL SHORTNESS OF BREATH  *UNUSUAL BRUISING OR BLEEDING  TENDERNESS IN MOUTH AND THROAT WITH OR WITHOUT PRESENCE OF ULCERS  *URINARY PROBLEMS  *BOWEL PROBLEMS  UNUSUAL RASH Items with * indicate a potential emergency and should be followed up as soon as possible.  Feel free to call the clinic you have any questions or concerns. The clinic phone number is [phone].   Gentle mouth care with Biotene products Salt and baking soda gargles after meals Make sure have BM every 3 days.  May hold miralax if diarrhea.

## 2014-12-28 NOTE — Progress Notes (Signed)
Discharged at 1540, with spouse via wheelchair in no distress.

## 2014-12-28 NOTE — Progress Notes (Signed)
Patient's left chest port-a-cath tender to touch.  With attempt to access patient yelled and pulled away.  Ice applied and emotional support given.  Dr. Burr Medico notified, here to see patient and ordered percocet for pain.  Patient resting awaiting pain relief to try to accesss port-a-cath. 11:25 patient's port-a-cath accessed with no pain or problems.  Reported relief of pain to port site with access.  Patient reports she is to receive [facility] today.  Called Dr. Burr Medico who will talk with patient in infusion room.  Dr. Burr Medico spoke with patient at 1430 about ferritin and iron.   1500 patient given 5FU bolus and tolerated well.  This nurse preparing CADD ambulatory pump and MRS. Been reports " I feel hard thumping in my chest, three times.  Is my body getting used to that medicine you gave me?"  Vitals signes checked and stable.  Denies S.O.B. Pain, dizziness or feeling faint.  Patient admits to feeling this in August 2015.  "I saw my PCP, they did an EKG and xray and said nothing is wrong."  Denies seeing cardiologist.   Called Dr. Burr Medico and notified her of these events.  Orders received to proceed with treatment if VSS.   1510 patient's VSS, she denies any further palpitations. 54 assisted to bathroom via wheelchair and one person assist.  Reports "it happened again.  I felt one more thump".  VSS, advised to go to  The ER if further episodes especially if sweating, dizzy and pain.  Spill kit given to patient.  Reviewed how to call the INFU system staff and how to rest at night with pump and do sponge baths.

## 2014-12-28 NOTE — Progress Notes (Signed)
Patient C/O feeling "thumps in her chest", VSS.

## 2014-12-28 NOTE — Progress Notes (Signed)
Reports feeling better with no further palpitations.

## 2014-12-30 ENCOUNTER — Ambulatory Visit (HOSPITAL_BASED_OUTPATIENT_CLINIC_OR_DEPARTMENT_OTHER): Payer: BC Managed Care – PPO

## 2014-12-30 ENCOUNTER — Telehealth: Payer: Self-pay | Admitting: *Deleted

## 2014-12-30 ENCOUNTER — Other Ambulatory Visit: Payer: Self-pay | Admitting: Physician Assistant

## 2014-12-30 DIAGNOSIS — D509 Iron deficiency anemia, unspecified: Secondary | ICD-10-CM

## 2014-12-30 DIAGNOSIS — C189 Malignant neoplasm of colon, unspecified: Secondary | ICD-10-CM

## 2014-12-30 MED ORDER — SODIUM CHLORIDE 0.9 % IJ SOLN
10.0000 mL | INTRAMUSCULAR | Status: DC | PRN
  Administered 2014-12-30: 10 mL
  Filled 2014-12-30: qty 10

## 2014-12-30 MED ORDER — FERUMOXYTOL INJECTION 510 MG/17 ML
510.0000 mg | Freq: Once | INTRAVENOUS | Status: AC
  Administered 2014-12-30: 510 mg via INTRAVENOUS
  Filled 2014-12-30: qty 17

## 2014-12-30 MED ORDER — HEPARIN SOD (PORK) LOCK FLUSH 100 UNIT/ML IV SOLN
500.0000 [IU] | Freq: Once | INTRAVENOUS | Status: AC | PRN
  Administered 2014-12-30: 500 [IU]
  Filled 2014-12-30: qty 5

## 2014-12-30 NOTE — Patient Instructions (Signed)

## 2014-12-30 NOTE — Telephone Encounter (Signed)
Confirmed with Dr. Burr Medico that she will receive fereheme today with her pump d/c. Spoke with infusion charge nurse to get her on schedule for 1 pm since pump was started at 3:10 pm on 12/28/13. Will take off flush list today. [place] notified.

## 2014-12-30 NOTE — Telephone Encounter (Signed)
Reports some mild body/muscle aches past couple days, but is improving. Some tingling in her fingers/toes on and off. No nausea, eating well. Good BM today. Asking for something to sleep at night-suggested she try OTC Benadryl 1st. Also asking if OK to take Tylenol or Ibuprofen for pain instead of the narcotic-confirmed OK to try OTC pain meds and try to limit how much ibuprofen she takes.

## 2015-01-03 ENCOUNTER — Ambulatory Visit (HOSPITAL_BASED_OUTPATIENT_CLINIC_OR_DEPARTMENT_OTHER): Payer: BC Managed Care – PPO | Admitting: Hematology

## 2015-01-03 ENCOUNTER — Encounter: Payer: Self-pay | Admitting: Hematology

## 2015-01-03 ENCOUNTER — Other Ambulatory Visit (HOSPITAL_BASED_OUTPATIENT_CLINIC_OR_DEPARTMENT_OTHER): Payer: BC Managed Care – PPO

## 2015-01-03 ENCOUNTER — Telehealth: Payer: Self-pay | Admitting: Hematology

## 2015-01-03 VITALS — BP 124/89 | HR 79 | Temp 98.1°F | Resp 18 | Ht 63.0 in | Wt 244.6 lb

## 2015-01-03 DIAGNOSIS — C189 Malignant neoplasm of colon, unspecified: Secondary | ICD-10-CM

## 2015-01-03 DIAGNOSIS — D509 Iron deficiency anemia, unspecified: Secondary | ICD-10-CM

## 2015-01-03 DIAGNOSIS — C187 Malignant neoplasm of sigmoid colon: Secondary | ICD-10-CM

## 2015-01-03 LAB — COMPREHENSIVE METABOLIC PANEL (CC13)
ALT: 7 U/L (ref 0–55)
AST: 11 U/L (ref 5–34)
Albumin: 4.1 g/dL (ref 3.5–5.0)
Alkaline Phosphatase: 71 U/L (ref 40–150)
Anion Gap: 10 mEq/L (ref 3–11)
BUN: 11.9 mg/dL (ref 7.0–26.0)
CO2: 24 mEq/L (ref 22–29)
Calcium: 9.2 mg/dL (ref 8.4–10.4)
Chloride: 107 mEq/L (ref 98–109)
Creatinine: 0.8 mg/dL (ref 0.6–1.1)
EGFR: >90 mL/min/{1.73_m2} (ref >90)
Glucose: 152 mg/dl — ABNORMAL HIGH (ref 70–140)
Potassium: 4.2 mEq/L (ref 3.5–5.1)
Sodium: 141 mEq/L (ref 136–145)
Total Bilirubin: 0.34 mg/dL (ref 0.20–1.20)
Total Protein: 7.5 g/dL (ref 6.4–8.3)

## 2015-01-03 LAB — CBC WITH DIFFERENTIAL/PLATELET
BASO%: 0.4 % (ref 0.0–2.0)
Basophils Absolute: 0 10*3/uL (ref 0.0–0.1)
EOS%: 4.5 % (ref 0.0–7.0)
Eosinophils Absolute: 0.3 10*3/uL (ref 0.0–0.5)
HCT: 33.8 % — ABNORMAL LOW (ref 34.8–46.6)
HGB: 10.5 g/dL — ABNORMAL LOW (ref 11.6–15.9)
LYMPH%: 38.7 % (ref 14.0–49.7)
MCH: 22.3 pg — ABNORMAL LOW (ref 25.1–34.0)
MCHC: 31.1 g/dL — ABNORMAL LOW (ref 31.5–36.0)
MCV: 71.8 fL — ABNORMAL LOW (ref 79.5–101.0)
MONO#: 0.2 10*3/uL (ref 0.1–0.9)
MONO%: 2.2 % (ref 0.0–14.0)
NEUT#: 3.6 10*3/uL (ref 1.5–6.5)
NEUT%: 54.2 % (ref 38.4–76.8)
Platelets: 397 10*3/uL (ref 145–400)
RBC: 4.71 10*6/uL (ref 3.70–5.45)
RDW: 18.3 % — ABNORMAL HIGH (ref 11.2–14.5)
WBC: 6.7 10*3/uL (ref 3.9–10.3)
lymph#: 2.6 10*3/uL (ref 0.9–3.3)

## 2015-01-03 MED ORDER — HYDROCODONE-ACETAMINOPHEN 5-300 MG PO TABS: 1.0000 | ORAL_TABLET | ORAL | Status: DC

## 2015-01-03 NOTE — Progress Notes (Signed)
[facility]  [phone]) [phone] [phone]  Clinic New Consult Note   Patient Care Team: Carney Living, MD as PCP - General Charna Elizabeth, MD as Consulting Physician (Gastroenterology) Romie Levee, MD as Consulting Physician (General Surgery) Malachy Mood, MD as Consulting Physician (Hematology) 01/03/2015  CHIEF COMPLAINTS Follow up colon cancer   DIAGNOSIS:  Colon cancer   Staging form: Colon and Rectum, AJCC 7th Edition     Clinical: Stage IIIB (T3, N1a, M0) - Signed by Malachy Mood, MD on 12/28/2014     Colon cancer   10/10/2014 Imaging CT abdomen and pelvis: Eccentric wall thickening along the sigmoid colon, worrisome for early colonic adenocarcinoma. No findings suspicious for metastatic disease. CT chest (-)      11/14/2014 Initial Diagnosis Colon cancer   11/14/2014 Pathologic Stage pT3pN1pMx, G2, tumor invading through the muscular propria into pericolonic fatty tissue.  LVI (-), PNI (-). 1/28 node positive, margins negative.    11/14/2014 Surgery sigmoid colon segmental resection, margins (-).     HISTORY OF INITIAL PRESENTING ILLNESS:  Jenny [name] 54 y.o. female without a significant past medical history, who is referred by her surgeon Dr. Maisie Fus to discuss adjuvant chemotherapy for her resected colon cancer.  She presented with abdominal pain for 8-9 month, which was related to position. She noticed bloody stool in Aug 2015. She was seen by PCP, and was referred to GI Dr. Loreta Ave, and underwent colonoscopy which showed a mass at sigmoid colon (per patient, I do not have the report). Biopsy showed adenocarcinoma. She was then referred to surgeon Dr. Maisie Fus and underwent sigmoid colon segmentectomy on 11/14/2014.   INTERIM HISTORY: Mrs. Brookover returns for follow-up after the first cycle of adjuvant chemotherapy. She tolerated the chemo well overall,  no significant nausea or vomiting. She did have low appetite and taste change  after chemo, but  able to keep food down, and drinks Ensure 1 bottle daily. She has cold sweats at night, and she keeps herself warm. Her appetite has improved this week. No fever or chills.    MEDICAL HISTORY:  Past Medical History  Diagnosis Date  . Enlarged thyroid   . Anemia   . Cancer     colon cancer     SURGICAL HISTORY: Past Surgical History  Procedure Laterality Date  . Tonsillectomy    . Cervical spine surgery  06/17/2012    C5-C7 ACDF  . Cesarean section    . Partial hysterectomy    . Portacath placement Left 12/22/2014    Procedure: INSERTION PORT-A-CATH LEFT SUBCLAVIAN;  Surgeon: Romie Levee, MD;  Location: [facility];  Service: General;  Laterality: Left;    SOCIAL HISTORY: History   Social History  . Marital Status: Married    Spouse Name: N/A    Number of Children: 2  . Years of Education: N/A   Occupational History  . Not on file.   Social History Main Topics  . Smoking status: Never Smoker   . Smokeless tobacco: Never Used  . Alcohol Use: No  . Drug Use: No  . Sexual Activity: Not on file    P2G2, one child was murdered. She is a Dentist.   FAMILY HISTORY: Family History  Problem Relation Age of Onset  . Colon cancer Neg Hx   . Liver Cancer Brother        . Hypertension Mother     ALLERGIES:  is allergic to other and shellfish allergy.  MEDICATIONS:  Current Outpatient  Prescriptions  Medication Sig Dispense Refill  . diphenhydrAMINE (BENADRYL) 12.5 MG/5ML liquid Take 12.5-25 mg by mouth every 4 (four) hours as needed for itching or allergies.    . hydrocortisone cream 0.5 % Apply 1 application topically 2 (two) times daily as needed for itching.    . lidocaine-prilocaine (EMLA) cream Apply 1 application topically as needed. 30 g 0  . ondansetron (ZOFRAN) 8 MG tablet Take 1 by mouth twice a day for 2 days after chemotherapy and then twice daily as needed 30 tablet 1  . oxyCODONE (ROXICODONE) 5 MG/5ML solution   0  . oxyCODONE-acetaminophen  (PERCOCET/ROXICET) 5-325 MG per tablet     . oxyCODONE-acetaminophen (ROXICET) 5-325 MG/5ML solution Take 5 mLs by mouth every 4 (four) hours as needed for severe pain. 50 mL 0  . triamcinolone (KENALOG) 0.025 % ointment Apply 1 application topically 2 (two) times daily. 30 g 1   No current facility-administered medications for this visit.    REVIEW OF SYSTEMS:   Constitutional: Denies fevers, chills or abnormal night sweats Eyes: Denies blurriness of vision, double vision or watery eyes Ears, nose, mouth, throat, and face: Denies mucositis or sore throat Respiratory: Denies cough, dyspnea or wheezes Cardiovascular: Denies palpitation, chest discomfort or lower extremity swelling Gastrointestinal:  Denies nausea, heartburn or change in bowel habits Skin: Denies abnormal skin rashes Lymphatics: Denies new lymphadenopathy or easy bruising Neurological:Denies numbness, tingling or new weaknesses Behavioral/Psych: Mood is stable, no new changes  All other systems were reviewed with the patient and are negative.  PHYSICAL EXAMINATION: ECOG PERFORMANCE STATUS: 1 Vital sign was reviewed, normal.  GENERAL:alert, no distress and comfortable SKIN: skin color, texture, turgor are normal, no rashes or significant lesions EYES: normal, conjunctiva are pink and non-injected, sclera clear OROPHARYNX:no exudate, no erythema and lips, buccal mucosa, and tongue normal  NECK: supple, thyroid normal size, non-tender, without nodularity LYMPH:  no palpable lymphadenopathy in the cervical, axillary or inguinal LUNGS: clear to auscultation and percussion with normal breathing effort HEART: regular rate & rhythm and no murmurs and no lower extremity edema ABDOMEN:abdomen soft, non-tender and normal bowel sounds. Surgical wound has well-healed Musculoskeletal:no cyanosis of digits and no clubbing  PSYCH: alert & oriented x 3 with fluent speech NEURO: no focal motor/sensory deficits  LABORATORY DATA:  I  have reviewed the data as listed Lab Results  Component Value Date   WBC 6.7 01/03/2015   HGB 10.5* 01/03/2015   HCT 33.8* 01/03/2015   MCV 71.8* 01/03/2015   PLT 397 01/03/2015    Recent Labs  05/12/14 1104  11/15/14 0435 11/16/14 0418 11/17/14 0454 12/05/14 1553  NA 138  < > 144 138 136* 138  K 4.9  < > 4.0 4.5 3.9 4.0  CL 101  < > 106 103 102  --   CO2 23  < > $R'24 27 26 23  'Te$ GLUCOSE 123*  < > 132* 129* 132* 148*  BUN 10  < > $R'10 10 10 'QY$ 10.2  CREATININE 0.70  < > 0.74 0.79 0.78 0.8  CALCIUM 9.6  < > 9.1 9.0 9.1 9.7  GFRNONAA >89  < > >90 >90 >90  --   GFRAA >89  < > >90 >90 >90  --   PROT 7.7  --   --   --   --  7.3  ALBUMIN 4.2  --   --   --   --  3.8  AST 24  --   --   --   --  10  ALT 19  --   --   --   --  17  ALKPHOS 69  --   --   --   --  59  BILITOT 0.4  --   --   --   --  0.29  < > = values in this interval not displayed.  PATHOLOGY REPORT 11/14/2014 Diagnosis(continued) 1. Colon, segmental resection for tumor, sigmoid - INVASIVE ADENOCARCINOMA, INVADING THROUGH THE MUSCULARIS PROPRIA INTO PERICOLONIC FATTY TISSUE. - ONE OF TWENTY-EIGHT LYMPH NODES, POSITIVE FOR METASTATIC CARCINOMA (1/28). - RESECTION MARGINS, NEGATIVE FOR ATYPIA OR MALIGNANCY - RESECTION MARGINS, NEGATIVE FOR ATYPIA OR MALIGNANCY. 2. Colon, resection margin (donut), colon final distal margin - BENIGN COLONIC MUCOSA, NO EVIDENCE OF MALIGNANCY. Microscopic Comment 1. COLON Specimen: Sigmoid colon Procedure: Segmental resection Tumor site: Sigmoid colon Specimen integrity: Intact Macroscopic intactness of mesorectum: N/A Macroscopic tumor perforation: No Invasive tumor: Maximum size: 2.7 cm, gross measurement Histologic type(s): Invasive adenocarcinoma Histologic grade and differentiation: G2: moderately differentiated/low grade Type of polyp tumor arose from: Tubular adenoma Microscopic extension of invasive tumor: Invading through the muscularis propria into pericolonic  fatty tissue. Lymph-Vascular invasion: Not identified Peri-neural invasion: Not identified Tumor deposit(s) (discontinuous extramural extension): N/A Resection margins: Negative Open margin: 7.1 cm Stapled margin: 5.5 cm Mesenteric margin (sigmoid and transverse): 9 cm Treatment effect (neo-adjuvant therapy): No Additional polyp(s): N/A Non-neoplastic findings: N/A Lymph nodes: number examined 28; number positive: 1 Pathologic Staging: pT3, pN1a, pMX Ancillary studies: MMR stains will be performed and an addendum report will follow. MSI testing will be performed at an outside institution and the results will be available in EPIC. (HCL:kh 11-15-14)  TUMOR MSI: stable (by PCR),  MMR: NORMAL   RADIOGRAPHIC STUDIES: I have personally reviewed the radiological images as listed and agreed with the findings in the report.  CT chest 10/25/2014 IMPRESSION: No CT findings for pulmonary metastatic disease. No mediastinal or hilar mass or adenopathy.  CT abdomen Pelvis w contrast 10/10/2014 Eccentric wall thickening along the sigmoid colon, likely corresponding to the finding on colonoscopy, worrisome for early colonic adenocarcinoma.  No findings suspicious for metastatic disease.  ASSESSMENT & PLAN:  55 year old African-American female, without significant past medical history, presents with abdominal pain and bloody bowel movement, anemia, was found to have a sigmoid adenocarcinoma, status post complete surgical resection.   1. Sigmoid colon adenocarcinoma, G2, pT3pN1aM0, stage IIIB, MSI stable  -She has recovered well from her surgery. I recommended adjuvant chemotherapy with FOLFOX for total of 12 cycles and she agreed. -She tolerated the first cycle well with some expected side effects. -We also discussed the 5 year surveillance plan after she completes the adjuvant therapy.  2. Abdominal pain, secondary to surgery -Much improved. She still takes Percocet occasionally. -She  is concerned about pain with her Mediport access, I given her a prescription of Vicodin and she can take 1 tablet before her port access.  3. Microcytic anemia, likely iron deficient anemia secondary to colon cancer -Her serum iron and saturation were low. Likely from her colon cancer relates. -she received iv feraheme last week.   Plan -continue FOLFOX Q2W, schedule next 3 treatments and lab today -RTC in 3 weeks, before third cycle chemo     All questions were answered. The patient knows to call the clinic with any problems,  questions or concerns.  I spent 20 minutes counseling the patient face to face. The total time spent in the appointment was 25 minutes and more than 50% was  on counseling.     Truitt Merle, MD 01/03/2015 1:48 PM

## 2015-01-03 NOTE — Telephone Encounter (Signed)
Gave avs & cal for Jan/Feb. Sent mess to sch tx. °

## 2015-01-05 ENCOUNTER — Telehealth: Payer: Self-pay | Admitting: Hematology

## 2015-01-05 ENCOUNTER — Telehealth: Payer: Self-pay | Admitting: *Deleted

## 2015-01-05 NOTE — Telephone Encounter (Signed)
LM to confirm change for 01/13/15.

## 2015-01-05 NOTE — Telephone Encounter (Signed)
Per staff message and POF I have scheduled appts. Advised scheduler of appts, first available given and top move lab appt. JMW

## 2015-01-11 ENCOUNTER — Other Ambulatory Visit: Payer: BC Managed Care – PPO

## 2015-01-11 ENCOUNTER — Ambulatory Visit (HOSPITAL_BASED_OUTPATIENT_CLINIC_OR_DEPARTMENT_OTHER): Payer: BC Managed Care – PPO

## 2015-01-11 ENCOUNTER — Other Ambulatory Visit (HOSPITAL_BASED_OUTPATIENT_CLINIC_OR_DEPARTMENT_OTHER): Payer: BC Managed Care – PPO

## 2015-01-11 ENCOUNTER — Other Ambulatory Visit: Payer: Self-pay | Admitting: *Deleted

## 2015-01-11 DIAGNOSIS — C187 Malignant neoplasm of sigmoid colon: Secondary | ICD-10-CM

## 2015-01-11 DIAGNOSIS — C189 Malignant neoplasm of colon, unspecified: Secondary | ICD-10-CM

## 2015-01-11 DIAGNOSIS — D509 Iron deficiency anemia, unspecified: Secondary | ICD-10-CM

## 2015-01-11 DIAGNOSIS — Z5111 Encounter for antineoplastic chemotherapy: Secondary | ICD-10-CM

## 2015-01-11 LAB — CBC & DIFF AND RETIC
BASO%: 0.6 % (ref 0.0–2.0)
Basophils Absolute: 0 10*3/uL (ref 0.0–0.1)
EOS%: 4.4 % (ref 0.0–7.0)
Eosinophils Absolute: 0.2 10*3/uL (ref 0.0–0.5)
HCT: 35.1 % (ref 34.8–46.6)
HGB: 10.8 g/dL — ABNORMAL LOW (ref 11.6–15.9)
Immature Retic Fract: 11.3 % — ABNORMAL HIGH (ref 1.60–10.00)
LYMPH%: 52.9 % — ABNORMAL HIGH (ref 14.0–49.7)
MCH: 22.7 pg — ABNORMAL LOW (ref 25.1–34.0)
MCHC: 30.8 g/dL — ABNORMAL LOW (ref 31.5–36.0)
MCV: 73.9 fL — ABNORMAL LOW (ref 79.5–101.0)
MONO#: 0.2 10*3/uL (ref 0.1–0.9)
MONO%: 5.6 % (ref 0.0–14.0)
NEUT#: 1.3 10*3/uL — ABNORMAL LOW (ref 1.5–6.5)
NEUT%: 36.5 % — ABNORMAL LOW (ref 38.4–76.8)
Platelets: 273 10*3/uL (ref 145–400)
RBC: 4.75 10*6/uL (ref 3.70–5.45)
RDW: 20.6 % — ABNORMAL HIGH (ref 11.2–14.5)
Retic %: 1.19 % (ref 0.70–2.10)
Retic Ct Abs: 56.53 10*3/uL (ref 33.70–90.70)
WBC: 3.4 10*3/uL — ABNORMAL LOW (ref 3.9–10.3)
lymph#: 1.8 10*3/uL (ref 0.9–3.3)

## 2015-01-11 LAB — COMPREHENSIVE METABOLIC PANEL (CC13)
ALT: 14 U/L (ref 0–55)
AST: 14 U/L (ref 5–34)
Albumin: 4.1 g/dL (ref 3.5–5.0)
Alkaline Phosphatase: 66 U/L (ref 40–150)
Anion Gap: 10 mEq/L (ref 3–11)
BUN: 11.9 mg/dL (ref 7.0–26.0)
CO2: 24 mEq/L (ref 22–29)
Calcium: 9.5 mg/dL (ref 8.4–10.4)
Chloride: 108 mEq/L (ref 98–109)
Creatinine: 0.8 mg/dL (ref 0.6–1.1)
EGFR: >90 mL/min/{1.73_m2} (ref >90)
Glucose: 110 mg/dl (ref 70–140)
Potassium: 4 mEq/L (ref 3.5–5.1)
Sodium: 142 mEq/L (ref 136–145)
Total Bilirubin: 0.41 mg/dL (ref 0.20–1.20)
Total Protein: 7.4 g/dL (ref 6.4–8.3)

## 2015-01-11 MED ORDER — LEUCOVORIN CALCIUM INJECTION 350 MG
400.0000 mg/m2 | Freq: Once | INTRAVENOUS | Status: AC
  Administered 2015-01-11: 888 mg via INTRAVENOUS
  Filled 2015-01-11: qty 44.4

## 2015-01-11 MED ORDER — DEXTROSE 5 % IV SOLN
Freq: Once | INTRAVENOUS | Status: AC
  Administered 2015-01-11: 15:00:00 via INTRAVENOUS

## 2015-01-11 MED ORDER — OXALIPLATIN CHEMO INJECTION 100 MG/20ML
85.0000 mg/m2 | Freq: Once | INTRAVENOUS | Status: AC
  Administered 2015-01-11: 190 mg via INTRAVENOUS
  Filled 2015-01-11: qty 38

## 2015-01-11 MED ORDER — SODIUM CHLORIDE 0.9 % IV SOLN
2400.0000 mg/m2 | INTRAVENOUS | Status: DC
  Administered 2015-01-11: 5350 mg via INTRAVENOUS
  Filled 2015-01-11: qty 107

## 2015-01-11 MED ORDER — DEXAMETHASONE SODIUM PHOSPHATE 10 MG/ML IJ SOLN
10.0000 mg | Freq: Once | INTRAMUSCULAR | Status: AC
  Administered 2015-01-11: 10 mg via INTRAVENOUS

## 2015-01-11 MED ORDER — ONDANSETRON 8 MG/50ML IVPB (CHCC)
8.0000 mg | Freq: Once | INTRAVENOUS | Status: AC
  Administered 2015-01-11: 8 mg via INTRAVENOUS

## 2015-01-11 MED ORDER — ONDANSETRON 8 MG/NS 50 ML IVPB
INTRAVENOUS | Status: AC
  Filled 2015-01-11: qty 8

## 2015-01-11 MED ORDER — DEXAMETHASONE SODIUM PHOSPHATE 10 MG/ML IJ SOLN
INTRAMUSCULAR | Status: AC
  Filled 2015-01-11: qty 1

## 2015-01-11 NOTE — Telephone Encounter (Signed)
Error

## 2015-01-11 NOTE — Patient Instructions (Signed)
[facility] Discharge Instructions for Patients Receiving Chemotherapy  Today you received the following chemotherapy agents oxaliplatin, leucovorin, 5FU pump  To help prevent nausea and vomiting after your treatment, we encourage you to take your nausea medication if needed. May start about 8 pm.   If you develop nausea and vomiting that is not controlled by your nausea medication, call the clinic.   BELOW ARE SYMPTOMS THAT SHOULD BE REPORTED IMMEDIATELY:  *FEVER GREATER THAN 100.5 F  *CHILLS WITH OR WITHOUT FEVER  NAUSEA AND VOMITING THAT IS NOT CONTROLLED WITH YOUR NAUSEA MEDICATION  *UNUSUAL SHORTNESS OF BREATH  *UNUSUAL BRUISING OR BLEEDING  TENDERNESS IN MOUTH AND THROAT WITH OR WITHOUT PRESENCE OF ULCERS  *URINARY PROBLEMS  *BOWEL PROBLEMS  UNUSUAL RASH Items with * indicate a potential emergency and should be followed up as soon as possible.  Feel free to call the clinic you have any questions or concerns. The clinic phone number is [phone].

## 2015-01-11 NOTE — Progress Notes (Signed)
OK to treat w/ANC 1.3 per Dr Burr Medico. She will dc 8fu push and add neulasta shot to pump dc day.

## 2015-01-13 ENCOUNTER — Ambulatory Visit (HOSPITAL_BASED_OUTPATIENT_CLINIC_OR_DEPARTMENT_OTHER): Payer: BC Managed Care – PPO

## 2015-01-13 ENCOUNTER — Telehealth: Payer: Self-pay | Admitting: *Deleted

## 2015-01-13 ENCOUNTER — Ambulatory Visit: Payer: BC Managed Care – PPO

## 2015-01-13 DIAGNOSIS — C187 Malignant neoplasm of sigmoid colon: Secondary | ICD-10-CM

## 2015-01-13 DIAGNOSIS — C189 Malignant neoplasm of colon, unspecified: Secondary | ICD-10-CM

## 2015-01-13 MED ORDER — HEPARIN SOD (PORK) LOCK FLUSH 100 UNIT/ML IV SOLN
500.0000 [IU] | Freq: Once | INTRAVENOUS | Status: AC | PRN
  Administered 2015-01-13: 500 [IU]
  Filled 2015-01-13: qty 5

## 2015-01-13 MED ORDER — PEGFILGRASTIM INJECTION 6 MG/0.6ML ~~LOC~~
6.0000 mg | PREFILLED_SYRINGE | Freq: Once | SUBCUTANEOUS | Status: AC
  Administered 2015-01-13: 6 mg via SUBCUTANEOUS

## 2015-01-13 MED ORDER — SODIUM CHLORIDE 0.9 % IJ SOLN
10.0000 mL | INTRAMUSCULAR | Status: DC | PRN
  Administered 2015-01-13: 10 mL
  Filled 2015-01-13: qty 10

## 2015-01-13 NOTE — Patient Instructions (Signed)
Pegfilgrastim injection What is this medicine? PEGFILGRASTIM (peg fil GRA stim) is a long-acting granulocyte colony-stimulating factor that stimulates the growth of neutrophils, a type of white blood cell important in the body's fight against infection. It is used to reduce the incidence of fever and infection in patients with certain types of cancer who are receiving chemotherapy that affects the bone marrow. This medicine may be used for other purposes; ask your health care provider or pharmacist if you have questions. COMMON BRAND NAME(S): Neulasta What should I tell my health care provider before I take this medicine? They need to know if you have any of these conditions: -latex allergy -ongoing radiation therapy -sickle cell disease -skin reactions to acrylic adhesives (On-Body Injector only) -an unusual or allergic reaction to pegfilgrastim, filgrastim, other medicines, foods, dyes, or preservatives -pregnant or trying to get pregnant -breast-feeding How should I use this medicine? This medicine is for injection under the skin. If you get this medicine at home, you will be taught how to prepare and give the pre-filled syringe or how to use the On-body Injector. Refer to the patient Instructions for Use for detailed instructions. Use exactly as directed. Take your medicine at regular intervals. Do not take your medicine more often than directed. It is important that you put your used needles and syringes in a special sharps container. Do not put them in a trash can. If you do not have a sharps container, call your pharmacist or healthcare provider to get one. Talk to your pediatrician regarding the use of this medicine in children. Special care may be needed. Overdosage: If you think you have taken too much of this medicine contact a poison control center or emergency room at once. NOTE: This medicine is only for you. Do not share this medicine with others. What if I miss a dose? It is  important not to miss your dose. Call your doctor or health care professional if you miss your dose. If you miss a dose due to an On-body Injector failure or leakage, a new dose should be administered as soon as possible using a single prefilled syringe for manual use. What may interact with this medicine? Interactions have not been studied. Give your health care provider a list of all the medicines, herbs, non-prescription drugs, or dietary supplements you use. Also tell them if you smoke, drink alcohol, or use illegal drugs. Some items may interact with your medicine. This list may not describe all possible interactions. Give your health care provider a list of all the medicines, herbs, non-prescription drugs, or dietary supplements you use. Also tell them if you smoke, drink alcohol, or use illegal drugs. Some items may interact with your medicine. What should I watch for while using this medicine? You may need blood work done while you are taking this medicine. If you are going to need a MRI, CT scan, or other procedure, tell your doctor that you are using this medicine (On-Body Injector only). What side effects may I notice from receiving this medicine? Side effects that you should report to your doctor or health care professional as soon as possible: -allergic reactions like skin rash, itching or hives, swelling of the face, lips, or tongue -dizziness -fever -pain, redness, or irritation at site where injected -pinpoint red spots on the skin -shortness of breath or breathing problems -stomach or side pain, or pain at the shoulder -swelling -tiredness -trouble passing urine Side effects that usually do not require medical attention (report to your doctor   or health care professional if they continue or are bothersome): -bone pain -muscle pain This list may not describe all possible side effects. Call your doctor for medical advice about side effects. You may report side effects to FDA at  1-800-FDA-1088. Where should I keep my medicine? Keep out of the reach of children. Store pre-filled syringes in a refrigerator between 2 and 8 degrees C (36 and 46 degrees F). Do not freeze. Keep in carton to protect from light. Throw away this medicine if it is left out of the refrigerator for more than 48 hours. Throw away any unused medicine after the expiration date. NOTE: This sheet is a summary. It may not cover all possible information. If you have questions about this medicine, talk to your doctor, pharmacist, or health care provider.  2015, [employer] Standard. (2014-03-10 16:14:05)  

## 2015-01-13 NOTE — Telephone Encounter (Signed)
Pt asked if she could make here before 3pm in order to have pump removed.  Pt states that she will try to make it here before 3pm today.

## 2015-01-25 ENCOUNTER — Ambulatory Visit (HOSPITAL_BASED_OUTPATIENT_CLINIC_OR_DEPARTMENT_OTHER): Payer: BC Managed Care – PPO

## 2015-01-25 ENCOUNTER — Other Ambulatory Visit (HOSPITAL_BASED_OUTPATIENT_CLINIC_OR_DEPARTMENT_OTHER): Payer: BC Managed Care – PPO

## 2015-01-25 ENCOUNTER — Telehealth: Payer: Self-pay | Admitting: *Deleted

## 2015-01-25 ENCOUNTER — Ambulatory Visit (HOSPITAL_BASED_OUTPATIENT_CLINIC_OR_DEPARTMENT_OTHER): Payer: BC Managed Care – PPO | Admitting: Hematology

## 2015-01-25 ENCOUNTER — Telehealth: Payer: Self-pay | Admitting: Hematology

## 2015-01-25 ENCOUNTER — Encounter: Payer: Self-pay | Admitting: Hematology

## 2015-01-25 VITALS — BP 135/83 | HR 102 | Temp 98.5°F | Resp 18 | Ht 63.0 in | Wt 244.4 lb

## 2015-01-25 DIAGNOSIS — C187 Malignant neoplasm of sigmoid colon: Secondary | ICD-10-CM

## 2015-01-25 DIAGNOSIS — C189 Malignant neoplasm of colon, unspecified: Secondary | ICD-10-CM

## 2015-01-25 DIAGNOSIS — Z5111 Encounter for antineoplastic chemotherapy: Secondary | ICD-10-CM

## 2015-01-25 DIAGNOSIS — D509 Iron deficiency anemia, unspecified: Secondary | ICD-10-CM

## 2015-01-25 LAB — COMPREHENSIVE METABOLIC PANEL (CC13)
ALT: 20 U/L (ref 0–55)
AST: 15 U/L (ref 5–34)
Albumin: 3.9 g/dL (ref 3.5–5.0)
Alkaline Phosphatase: 86 U/L (ref 40–150)
Anion Gap: 9 mEq/L (ref 3–11)
BUN: 11.1 mg/dL (ref 7.0–26.0)
CO2: 24 mEq/L (ref 22–29)
Calcium: 9.1 mg/dL (ref 8.4–10.4)
Chloride: 109 mEq/L (ref 98–109)
Creatinine: 0.8 mg/dL (ref 0.6–1.1)
EGFR: >90 mL/min/{1.73_m2} (ref >90)
Glucose: 133 mg/dl (ref 70–140)
Potassium: 3.9 mEq/L (ref 3.5–5.1)
Sodium: 142 mEq/L (ref 136–145)
Total Bilirubin: 0.42 mg/dL (ref 0.20–1.20)
Total Protein: 7.1 g/dL (ref 6.4–8.3)

## 2015-01-25 LAB — CBC & DIFF AND RETIC
BASO%: 0.2 % (ref 0.0–2.0)
Basophils Absolute: 0 10*3/uL (ref 0.0–0.1)
EOS%: 1.4 % (ref 0.0–7.0)
Eosinophils Absolute: 0.1 10*3/uL (ref 0.0–0.5)
HCT: 37.3 % (ref 34.8–46.6)
HGB: 11.8 g/dL (ref 11.6–15.9)
Immature Retic Fract: 26.3 % — ABNORMAL HIGH (ref 1.60–10.00)
LYMPH%: 30.7 % (ref 14.0–49.7)
MCH: 24.1 pg — ABNORMAL LOW (ref 25.1–34.0)
MCHC: 31.6 g/dL (ref 31.5–36.0)
MCV: 76.3 fL — ABNORMAL LOW (ref 79.5–101.0)
MONO#: 0.3 10*3/uL (ref 0.1–0.9)
MONO%: 4.2 % (ref 0.0–14.0)
NEUT#: 4.2 10*3/uL (ref 1.5–6.5)
NEUT%: 63.5 % (ref 38.4–76.8)
Platelets: 128 10*3/uL — ABNORMAL LOW (ref 145–400)
RBC: 4.89 10*6/uL (ref 3.70–5.45)
RDW: 22.8 % — ABNORMAL HIGH (ref 11.2–14.5)
Retic %: 1.46 % (ref 0.70–2.10)
Retic Ct Abs: 71.39 10*3/uL (ref 33.70–90.70)
WBC: 6.6 10*3/uL (ref 3.9–10.3)
lymph#: 2 10*3/uL (ref 0.9–3.3)

## 2015-01-25 MED ORDER — DEXAMETHASONE SODIUM PHOSPHATE 10 MG/ML IJ SOLN
10.0000 mg | Freq: Once | INTRAMUSCULAR | Status: AC
  Administered 2015-01-25: 10 mg via INTRAVENOUS

## 2015-01-25 MED ORDER — FLUOROURACIL CHEMO INJECTION 2.5 GM/50ML
400.0000 mg/m2 | Freq: Once | INTRAVENOUS | Status: AC
  Administered 2015-01-25: 900 mg via INTRAVENOUS
  Filled 2015-01-25: qty 18

## 2015-01-25 MED ORDER — LEUCOVORIN CALCIUM INJECTION 350 MG
400.0000 mg/m2 | Freq: Once | INTRAVENOUS | Status: AC
  Administered 2015-01-25: 888 mg via INTRAVENOUS
  Filled 2015-01-25: qty 44.4

## 2015-01-25 MED ORDER — DEXAMETHASONE SODIUM PHOSPHATE 10 MG/ML IJ SOLN
INTRAMUSCULAR | Status: AC
  Filled 2015-01-25: qty 1

## 2015-01-25 MED ORDER — OXALIPLATIN CHEMO INJECTION 100 MG/20ML
85.0000 mg/m2 | Freq: Once | INTRAVENOUS | Status: AC
  Administered 2015-01-25: 190 mg via INTRAVENOUS
  Filled 2015-01-25: qty 38

## 2015-01-25 MED ORDER — ONDANSETRON 8 MG/50ML IVPB (CHCC)
8.0000 mg | Freq: Once | INTRAVENOUS | Status: AC
  Administered 2015-01-25: 8 mg via INTRAVENOUS

## 2015-01-25 MED ORDER — ONDANSETRON 8 MG/NS 50 ML IVPB
INTRAVENOUS | Status: AC
  Filled 2015-01-25: qty 8

## 2015-01-25 MED ORDER — DEXTROSE 5 % IV SOLN
Freq: Once | INTRAVENOUS | Status: AC
  Administered 2015-01-25: 12:00:00 via INTRAVENOUS

## 2015-01-25 MED ORDER — SODIUM CHLORIDE 0.9 % IV SOLN
2400.0000 mg/m2 | INTRAVENOUS | Status: DC
  Administered 2015-01-25: 5350 mg via INTRAVENOUS
  Filled 2015-01-25: qty 107

## 2015-01-25 NOTE — Telephone Encounter (Signed)
Pt confirmed labs/ov per 02/03 POF, gave pt AVS.... KJ, sent msg to add chemo

## 2015-01-25 NOTE — Progress Notes (Signed)
[place]  Telephone:([phone]) [phone] Fax:([phone]) [place] Note   Patient Care Team: Lind Covert, MD as PCP - General Juanita Craver, MD as Consulting Physician (Gastroenterology) [name], MD as Consulting Physician (General Surgery) Truitt Merle, MD as Consulting Physician (Hematology) 01/25/2015  CHIEF COMPLAINTS Follow up colon cancer   DIAGNOSIS:  Colon cancer   Staging form: Colon and Rectum, AJCC 7th Edition     Clinical: Stage IIIB (T3, N1a, M0) - Signed by Truitt Merle, MD on 12/28/2014     Colon cancer   10/10/2014 Imaging CT abdomen and pelvis: Eccentric wall thickening along the sigmoid colon, worrisome for early colonic adenocarcinoma. No findings suspicious for metastatic disease. CT chest (-)      11/14/2014 Initial Diagnosis Colon cancer   11/14/2014 Pathologic Stage pT3pN1pMx, G2, tumor invading through the muscular propria into pericolonic fatty tissue.  LVI (-), PNI (-). 1/28 node positive, margins negative.    11/14/2014 Surgery sigmoid colon segmental resection, margins (-).     HISTORY OF INITIAL PRESENTING ILLNESS:  Jenny [name] 54 y.o. female without a significant past medical history, who is referred by her surgeon Dr. Marcello Moores to discuss adjuvant chemotherapy for her resected colon cancer.  She presented with abdominal pain for 8-9 month, which was related to position. She noticed bloody stool in Aug 2015. She was seen by PCP, and was referred to GI Dr. Collene Mares, and underwent colonoscopy which showed a mass at sigmoid colon (per patient, I do not have the report). Biopsy showed adenocarcinoma. She was then referred to surgeon Dr. Marcello Moores and underwent sigmoid colon segmentectomy on 11/14/2014.   TREATMENT: [id number]  started on 12/28/2014   INTERIM HISTORY: Mrs. Yusupov returns for follow-up and third cycle chemo. She tolerated the second cycle chemotherapy very well. She had mild nose bleeding yesterday. She has some  sensitivity to cold water, she also reports numbness and tingling on fingers and toes, mild, no pain, no effect on her and functions, gait is stable. No fever or chills, she has good appetite and eats well.   MEDICAL HISTORY:  Past Medical History  Diagnosis Date  . Enlarged thyroid   . Anemia   . Cancer     colon cancer     SURGICAL HISTORY: Past Surgical History  Procedure Laterality Date  . Tonsillectomy    . Cervical spine surgery  06/17/2012    C5-C7 ACDF  . Cesarean section    . Partial hysterectomy    . Portacath placement Left 12/22/2014    Procedure: INSERTION PORT-A-CATH LEFT SUBCLAVIAN;  Surgeon: [name], MD;  Location: [facility];  Service: General;  Laterality: Left;    SOCIAL HISTORY: History   Social History  . Marital Status: Married    Spouse Name: N/A    Number of Children: 2  . Years of Education: N/A   Occupational History  . Not on file.   Social History Main Topics  . Smoking status: Never Smoker   . Smokeless tobacco: Never Used  . Alcohol Use: No  . Drug Use: No  . Sexual Activity: Not on file    P2G2, one child was murdered. She is a Paramedic.   FAMILY HISTORY: Family History  Problem Relation Age of Onset  . Colon cancer Neg Hx   . Liver Cancer Brother        . Hypertension Mother     ALLERGIES:  is allergic to other and shellfish allergy.  MEDICATIONS:  Current Outpatient Prescriptions  Medication Sig Dispense Refill  . diphenhydrAMINE (BENADRYL) 12.5 MG/5ML liquid Take 12.5-25 mg by mouth every 4 (four) hours as needed for itching or allergies.    . Hydrocodone-Acetaminophen (VICODIN) 5-300 MG TABS Take 1 tablet by mouth every 21 ( twenty-one) days. 5 each 0  . hydrocortisone cream 0.5 % Apply 1 application topically 2 (two) times daily as needed for itching.    . lidocaine-prilocaine (EMLA) cream Apply 1 application topically as needed. 30 g 0  . ondansetron (ZOFRAN) 8 MG tablet Take 1 by mouth twice a day for 2  days after chemotherapy and then twice daily as needed 30 tablet 1  . oxyCODONE (ROXICODONE) 5 MG/5ML solution   0  . oxyCODONE-acetaminophen (PERCOCET/ROXICET) 5-325 MG per tablet     . oxyCODONE-acetaminophen (ROXICET) 5-325 MG/5ML solution Take 5 mLs by mouth every 4 (four) hours as needed for severe pain. 50 mL 0  . triamcinolone (KENALOG) 0.025 % ointment Apply 1 application topically 2 (two) times daily. 30 g 1   No current facility-administered medications for this visit.    REVIEW OF SYSTEMS:   Constitutional: Denies fevers, chills or abnormal night sweats Eyes: Denies blurriness of vision, double vision or watery eyes Ears, nose, mouth, throat, and face: Denies mucositis or sore throat Respiratory: Denies cough, dyspnea or wheezes Cardiovascular: Denies palpitation, chest discomfort or lower extremity swelling Gastrointestinal:  Denies nausea, heartburn or change in bowel habits Skin: Denies abnormal skin rashes Lymphatics: Denies new lymphadenopathy or easy bruising Neurological:Denies numbness, tingling or new weaknesses Behavioral/Psych: Mood is stable, no new changes  All other systems were reviewed with the patient and are negative.  PHYSICAL EXAMINATION: ECOG PERFORMANCE STATUS: 1 Vital sign was reviewed, normal.  GENERAL:alert, no distress and comfortable SKIN: skin color, texture, turgor are normal, no rashes or significant lesions EYES: normal, conjunctiva are pink and non-injected, sclera clear OROPHARYNX:no exudate, no erythema and lips, buccal mucosa, and tongue normal  NECK: supple, thyroid normal size, non-tender, without nodularity LYMPH:  no palpable lymphadenopathy in the cervical, axillary or inguinal LUNGS: clear to auscultation and percussion with normal breathing effort HEART: regular rate & rhythm and no murmurs and no lower extremity edema ABDOMEN:abdomen soft, non-tender and normal bowel sounds. Surgical wound has well-healed Musculoskeletal:no  cyanosis of digits and no clubbing  PSYCH: alert & oriented x 3 with fluent speech NEURO: no focal motor/sensory deficits  LABORATORY DATA:  I have reviewed the data as listed Lab Results  Component Value Date   WBC 6.6 01/25/2015   HGB 11.8 01/25/2015   HCT 37.3 01/25/2015   MCV 76.3* 01/25/2015   PLT 128* 01/25/2015    Recent Labs  11/15/14 0435 11/16/14 0418 11/17/14 0454  01/03/15 1311 01/11/15 1242 01/25/15 1011  NA 144 138 136*  < > 141 142 142  K 4.0 4.5 3.9  < > 4.2 4.0 3.9  CL 106 103 102  --   --   --   --   CO2 $Re'24 27 26  'qEq$ < > $R'24 24 24  'ie$ GLUCOSE 132* 129* 132*  < > 152* 110 133  BUN $Re'10 10 10  'fvz$ < > 11.9 11.9 11.1  CREATININE 0.74 0.79 0.78  < > 0.8 0.8 0.8  CALCIUM 9.1 9.0 9.1  < > 9.2 9.5 9.1  GFRNONAA >90 >90 >90  --   --   --   --   GFRAA >90 >90 >90  --   --   --   --  PROT  --   --   --   < > 7.5 7.4 7.1  ALBUMIN  --   --   --   < > 4.1 4.1 3.9  AST  --   --   --   < > $R'11 14 15  'Vf$ ALT  --   --   --   < > $R'7 14 20  'AO$ ALKPHOS  --   --   --   < > 71 66 86  BILITOT  --   --   --   < > 0.34 0.41 0.42  < > = values in this interval not displayed.  PATHOLOGY REPORT 11/14/2014 Diagnosis(continued) 1. Colon, segmental resection for tumor, sigmoid - INVASIVE ADENOCARCINOMA, INVADING THROUGH THE MUSCULARIS PROPRIA INTO PERICOLONIC FATTY TISSUE. - ONE OF TWENTY-EIGHT LYMPH NODES, POSITIVE FOR METASTATIC CARCINOMA (1/28). - RESECTION MARGINS, NEGATIVE FOR ATYPIA OR MALIGNANCY - RESECTION MARGINS, NEGATIVE FOR ATYPIA OR MALIGNANCY. 2. Colon, resection margin (donut), colon final distal margin - BENIGN COLONIC MUCOSA, NO EVIDENCE OF MALIGNANCY. Microscopic Comment 1. COLON Specimen: Sigmoid colon Procedure: Segmental resection Tumor site: Sigmoid colon Specimen integrity: Intact Macroscopic intactness of mesorectum: N/A Macroscopic tumor perforation: No Invasive tumor: Maximum size: 2.7 cm, gross measurement Histologic type(s): Invasive adenocarcinoma Histologic  grade and differentiation: G2: moderately differentiated/low grade Type of polyp tumor arose from: Tubular adenoma Microscopic extension of invasive tumor: Invading through the muscularis propria into pericolonic fatty tissue. Lymph-Vascular invasion: Not identified Peri-neural invasion: Not identified Tumor deposit(s) (discontinuous extramural extension): N/A Resection margins: Negative Open margin: 7.1 cm Stapled margin: 5.5 cm Mesenteric margin (sigmoid and transverse): 9 cm Treatment effect (neo-adjuvant therapy): No Additional polyp(s): N/A Non-neoplastic findings: N/A Lymph nodes: number examined 28; number positive: 1 Pathologic Staging: pT3, pN1a, pMX Ancillary studies: MMR stains will be performed and an addendum report will follow. MSI testing will be performed at an outside institution and the results will be available in EPIC. (HCL:kh 11-15-14)  TUMOR MSI: stable (by PCR),  MMR: NORMAL   RADIOGRAPHIC STUDIES: I have personally reviewed the radiological images as listed and agreed with the findings in the report.  CT chest 10/25/2014 IMPRESSION: No CT findings for pulmonary metastatic disease. No mediastinal or hilar mass or adenopathy.  CT abdomen Pelvis w contrast 10/10/2014 Eccentric wall thickening along the sigmoid colon, likely corresponding to the finding on colonoscopy, worrisome for early colonic adenocarcinoma.  No findings suspicious for metastatic disease.  ASSESSMENT & PLAN:  54 year old African-American female, without significant past medical history, presents with abdominal pain and bloody bowel movement, anemia, was found to have a sigmoid adenocarcinoma, status post complete surgical resection.   1. Sigmoid colon adenocarcinoma, G2, pT3pN1aM0, stage IIIB, MSI stable  -She is on adjuvant FOLFOX for total of 12 cycles planned. The goal of treatment is curative.  -She tolerated the first two  cycle well with some expected side effects. -She has  developed some mild neuropathy, we'll watch it closely. -We also discussed the 5 year surveillance plan after she completes the adjuvant therapy.  2. Abdominal pain, secondary to surgery -Resolved. She still takes Percocet occasionally.  3. Microcytic anemia, likely iron deficient anemia secondary to colon cancer -Her serum iron and saturation were low. Likely from her colon cancer relates.  -Her anemia improved after IV Feraheme. She will get her second dose today.  Plan -continue FOLFOX Q2W, cycle 3 today, no neulasta with this cycle and next cycle unless her ANC<1.5 before next cycle  -second dose Feraheme today  -  RTC in 4 weeks before cycle 5    All questions were answered. The patient knows to call the clinic with any problems,  questions or concerns.  I spent 20 minutes counseling the patient face to face. The total time spent in the appointment was 25 minutes and more than 50% was on counseling.     Truitt Merle, MD 01/25/2015 10:58 AM

## 2015-01-25 NOTE — Telephone Encounter (Signed)
Per staff message and POF I have scheduled appts. Advised scheduler of appts and to move labs. JMW  

## 2015-01-25 NOTE — Patient Instructions (Signed)
[place] Discharge Instructions for Patients Receiving Chemotherapy  Today you received the following chemotherapy agents: Oxaliplatin, Leucovorin, 5FU  To help prevent nausea and vomiting after your treatment, we encourage you to take your nausea medication: Zofran 8 mg every 8 hours as needed.   If you develop nausea and vomiting that is not controlled by your nausea medication, call the clinic.   BELOW ARE SYMPTOMS THAT SHOULD BE REPORTED IMMEDIATELY:  *FEVER GREATER THAN 100.5 F  *CHILLS WITH OR WITHOUT FEVER  NAUSEA AND VOMITING THAT IS NOT CONTROLLED WITH YOUR NAUSEA MEDICATION  *UNUSUAL SHORTNESS OF BREATH  *UNUSUAL BRUISING OR BLEEDING  TENDERNESS IN MOUTH AND THROAT WITH OR WITHOUT PRESENCE OF ULCERS  *URINARY PROBLEMS  *BOWEL PROBLEMS  UNUSUAL RASH Items with * indicate a potential emergency and should be followed up as soon as possible.  Feel free to call the clinic you have any questions or concerns. The clinic phone number is [phone].

## 2015-01-27 ENCOUNTER — Ambulatory Visit: Payer: BC Managed Care – PPO

## 2015-01-27 ENCOUNTER — Ambulatory Visit (HOSPITAL_BASED_OUTPATIENT_CLINIC_OR_DEPARTMENT_OTHER): Payer: BC Managed Care – PPO

## 2015-01-27 VITALS — BP 112/62 | HR 65 | Temp 98.2°F | Resp 20

## 2015-01-27 DIAGNOSIS — Z452 Encounter for adjustment and management of vascular access device: Secondary | ICD-10-CM

## 2015-01-27 DIAGNOSIS — C189 Malignant neoplasm of colon, unspecified: Secondary | ICD-10-CM

## 2015-01-27 DIAGNOSIS — C187 Malignant neoplasm of sigmoid colon: Secondary | ICD-10-CM

## 2015-01-27 MED ORDER — HEPARIN SOD (PORK) LOCK FLUSH 100 UNIT/ML IV SOLN
500.0000 [IU] | Freq: Once | INTRAVENOUS | Status: AC
  Administered 2015-01-27: 500 [IU] via INTRAVENOUS
  Filled 2015-01-27: qty 5

## 2015-01-27 MED ORDER — SODIUM CHLORIDE 0.9 % IJ SOLN
10.0000 mL | INTRAMUSCULAR | Status: DC | PRN
  Administered 2015-01-27: 10 mL via INTRAVENOUS
  Filled 2015-01-27: qty 10

## 2015-01-27 NOTE — Patient Instructions (Signed)
Implanted Port Insertion, Care After Refer to this sheet in the next few weeks. These instructions provide you with information on caring for yourself after your procedure. Your health care provider may also give you more specific instructions. Your treatment has been planned according to current medical practices, but problems sometimes occur. Call your health care provider if you have any problems or questions after your procedure. WHAT TO EXPECT AFTER THE PROCEDURE After your procedure, it is typical to have the following:   Discomfort at the port insertion site. Ice packs to the area will help.  Bruising on the skin over the port. This will subside in 3-4 days. HOME CARE INSTRUCTIONS  After your port is placed, you will get a manufacturer's information card. The card has information about your port. Keep this card with you at all times.   Know what kind of port you have. There are many types of ports available.   Wear a medical alert bracelet in case of an emergency. This can help alert health care workers that you have a port.   The port can stay in for as long as your health care provider believes it is necessary.   A home health care nurse may give medicines and take care of the port.   You or a family member can get special training and directions for giving medicine and taking care of the port at home.  SEEK MEDICAL CARE IF:   Your port does not flush or you are unable to get a blood return.   You have a fever or chills. SEEK IMMEDIATE MEDICAL CARE IF:  You have new fluid or pus coming from your incision.   You notice a bad smell coming from your incision site.   You have swelling, pain, or more redness at the incision or port site.   You have chest pain or shortness of breath. Document Released: 09/29/2013 Document Revised: 12/14/2013 Document Reviewed: 09/29/2013 Exit[employer] Patient Information 2015 [employer]. This information is not intended to replace  advice given to you by your health care provider. Make sure you discuss any questions you have with your health care provider.  

## 2015-02-08 ENCOUNTER — Ambulatory Visit (HOSPITAL_BASED_OUTPATIENT_CLINIC_OR_DEPARTMENT_OTHER): Payer: BC Managed Care – PPO

## 2015-02-08 ENCOUNTER — Other Ambulatory Visit (HOSPITAL_BASED_OUTPATIENT_CLINIC_OR_DEPARTMENT_OTHER): Payer: BC Managed Care – PPO

## 2015-02-08 DIAGNOSIS — C187 Malignant neoplasm of sigmoid colon: Secondary | ICD-10-CM

## 2015-02-08 DIAGNOSIS — C189 Malignant neoplasm of colon, unspecified: Secondary | ICD-10-CM

## 2015-02-08 DIAGNOSIS — Z5111 Encounter for antineoplastic chemotherapy: Secondary | ICD-10-CM

## 2015-02-08 LAB — CBC & DIFF AND RETIC
BASO%: 0.3 % (ref 0.0–2.0)
Basophils Absolute: 0 10*3/uL (ref 0.0–0.1)
EOS%: 5.2 % (ref 0.0–7.0)
Eosinophils Absolute: 0.2 10*3/uL (ref 0.0–0.5)
HCT: 36.6 % (ref 34.8–46.6)
HGB: 11.7 g/dL (ref 11.6–15.9)
Immature Retic Fract: 19.3 % — ABNORMAL HIGH (ref 1.60–10.00)
LYMPH%: 50.6 % — ABNORMAL HIGH (ref 14.0–49.7)
MCH: 24.5 pg — ABNORMAL LOW (ref 25.1–34.0)
MCHC: 32 g/dL (ref 31.5–36.0)
MCV: 76.6 fL — ABNORMAL LOW (ref 79.5–101.0)
MONO#: 0.3 10*3/uL (ref 0.1–0.9)
MONO%: 7.6 % (ref 0.0–14.0)
NEUT#: 1.2 10*3/uL — ABNORMAL LOW (ref 1.5–6.5)
NEUT%: 36.3 % — ABNORMAL LOW (ref 38.4–76.8)
Platelets: 160 10*3/uL (ref 145–400)
RBC: 4.78 10*6/uL (ref 3.70–5.45)
RDW: 23.9 % — ABNORMAL HIGH (ref 11.2–14.5)
Retic %: 1.74 % (ref 0.70–2.10)
Retic Ct Abs: 83.17 10*3/uL (ref 33.70–90.70)
WBC: 3.3 10*3/uL — ABNORMAL LOW (ref 3.9–10.3)
lymph#: 1.7 10*3/uL (ref 0.9–3.3)

## 2015-02-08 LAB — COMPREHENSIVE METABOLIC PANEL (CC13)
ALT: 35 U/L (ref 0–55)
AST: 35 U/L — ABNORMAL HIGH (ref 5–34)
Albumin: 4 g/dL (ref 3.5–5.0)
Alkaline Phosphatase: 71 U/L (ref 40–150)
Anion Gap: 11 mEq/L (ref 3–11)
BUN: 11.5 mg/dL (ref 7.0–26.0)
CO2: 23 mEq/L (ref 22–29)
Calcium: 9.8 mg/dL (ref 8.4–10.4)
Chloride: 109 mEq/L (ref 98–109)
Creatinine: 0.8 mg/dL (ref 0.6–1.1)
EGFR: >90 mL/min/{1.73_m2} (ref >90)
Glucose: 108 mg/dl (ref 70–140)
Potassium: 4.1 mEq/L (ref 3.5–5.1)
Sodium: 144 mEq/L (ref 136–145)
Total Bilirubin: 0.37 mg/dL (ref 0.20–1.20)
Total Protein: 7.6 g/dL (ref 6.4–8.3)

## 2015-02-08 MED ORDER — ONDANSETRON 8 MG/NS 50 ML IVPB
INTRAVENOUS | Status: AC
  Filled 2015-02-08: qty 8

## 2015-02-08 MED ORDER — SODIUM CHLORIDE 0.9 % IV SOLN
2400.0000 mg/m2 | INTRAVENOUS | Status: DC
  Administered 2015-02-08: 5350 mg via INTRAVENOUS
  Filled 2015-02-08: qty 107

## 2015-02-08 MED ORDER — OXALIPLATIN CHEMO INJECTION 100 MG/20ML
85.0000 mg/m2 | Freq: Once | INTRAVENOUS | Status: AC
  Administered 2015-02-08: 190 mg via INTRAVENOUS
  Filled 2015-02-08: qty 38

## 2015-02-08 MED ORDER — LEUCOVORIN CALCIUM INJECTION 350 MG
400.0000 mg/m2 | Freq: Once | INTRAMUSCULAR | Status: AC
  Administered 2015-02-08: 888 mg via INTRAVENOUS
  Filled 2015-02-08: qty 44.4

## 2015-02-08 MED ORDER — ONDANSETRON 8 MG/50ML IVPB (CHCC)
8.0000 mg | Freq: Once | INTRAVENOUS | Status: AC
  Administered 2015-02-08: 8 mg via INTRAVENOUS

## 2015-02-08 MED ORDER — DEXAMETHASONE SODIUM PHOSPHATE 10 MG/ML IJ SOLN
INTRAMUSCULAR | Status: AC
  Filled 2015-02-08: qty 1

## 2015-02-08 MED ORDER — DEXTROSE 5 % IV SOLN
Freq: Once | INTRAVENOUS | Status: AC
  Administered 2015-02-08: 12:00:00 via INTRAVENOUS

## 2015-02-08 MED ORDER — FLUOROURACIL CHEMO INJECTION 2.5 GM/50ML
400.0000 mg/m2 | Freq: Once | INTRAVENOUS | Status: AC
  Administered 2015-02-08: 900 mg via INTRAVENOUS
  Filled 2015-02-08: qty 18

## 2015-02-08 MED ORDER — DEXAMETHASONE SODIUM PHOSPHATE 10 MG/ML IJ SOLN
10.0000 mg | Freq: Once | INTRAMUSCULAR | Status: AC
  Administered 2015-02-08: 10 mg via INTRAVENOUS

## 2015-02-08 NOTE — Patient Instructions (Signed)
[facility] Discharge Instructions for Patients Receiving Chemotherapy  Today you received the following chemotherapy agents: Oxaliplatin, Leucovorin, 5FU  To help prevent nausea and vomiting after your treatment, we encourage you to take your nausea medication: Zofran 8 mg every 12 hours for 2 days after chemotherapy.   If you develop nausea and vomiting that is not controlled by your nausea medication, call the clinic.   BELOW ARE SYMPTOMS THAT SHOULD BE REPORTED IMMEDIATELY:  *FEVER GREATER THAN 100.5 F  *CHILLS WITH OR WITHOUT FEVER  NAUSEA AND VOMITING THAT IS NOT CONTROLLED WITH YOUR NAUSEA MEDICATION  *UNUSUAL SHORTNESS OF BREATH  *UNUSUAL BRUISING OR BLEEDING  TENDERNESS IN MOUTH AND THROAT WITH OR WITHOUT PRESENCE OF ULCERS  *URINARY PROBLEMS  *BOWEL PROBLEMS  UNUSUAL RASH Items with * indicate a potential emergency and should be followed up as soon as possible.  Feel free to call the clinic you have any questions or concerns. The clinic phone number is [phone].

## 2015-02-08 NOTE — Progress Notes (Signed)
Per Dr Burr Medico it is okay to treat pt today with chemotherapy and today's labs.

## 2015-02-10 ENCOUNTER — Ambulatory Visit (HOSPITAL_BASED_OUTPATIENT_CLINIC_OR_DEPARTMENT_OTHER): Payer: BC Managed Care – PPO

## 2015-02-10 ENCOUNTER — Other Ambulatory Visit: Payer: Self-pay

## 2015-02-10 DIAGNOSIS — Z5189 Encounter for other specified aftercare: Secondary | ICD-10-CM

## 2015-02-10 DIAGNOSIS — C189 Malignant neoplasm of colon, unspecified: Secondary | ICD-10-CM

## 2015-02-10 DIAGNOSIS — C187 Malignant neoplasm of sigmoid colon: Secondary | ICD-10-CM

## 2015-02-10 MED ORDER — HEPARIN SOD (PORK) LOCK FLUSH 100 UNIT/ML IV SOLN
500.0000 [IU] | Freq: Once | INTRAVENOUS | Status: AC | PRN
  Administered 2015-02-10: 500 [IU]
  Filled 2015-02-10: qty 5

## 2015-02-10 MED ORDER — SODIUM CHLORIDE 0.9 % IJ SOLN
10.0000 mL | INTRAMUSCULAR | Status: DC | PRN
  Administered 2015-02-10: 10 mL
  Filled 2015-02-10: qty 10

## 2015-02-10 MED ORDER — PEGFILGRASTIM INJECTION 6 MG/0.6ML ~~LOC~~
6.0000 mg | PREFILLED_SYRINGE | Freq: Once | SUBCUTANEOUS | Status: AC
  Administered 2015-02-10: 6 mg via SUBCUTANEOUS
  Filled 2015-02-10: qty 0.6

## 2015-02-10 NOTE — Patient Instructions (Signed)
Pegfilgrastim injection What is this medicine? PEGFILGRASTIM (peg fil GRA stim) is a long-acting granulocyte colony-stimulating factor that stimulates the growth of neutrophils, a type of white blood cell important in the body's fight against infection. It is used to reduce the incidence of fever and infection in patients with certain types of cancer who are receiving chemotherapy that affects the bone marrow. This medicine may be used for other purposes; ask your health care provider or pharmacist if you have questions. COMMON BRAND NAME(S): Neulasta What should I tell my health care provider before I take this medicine? They need to know if you have any of these conditions: -latex allergy -ongoing radiation therapy -sickle cell disease -skin reactions to acrylic adhesives (On-Body Injector only) -an unusual or allergic reaction to pegfilgrastim, filgrastim, other medicines, foods, dyes, or preservatives -pregnant or trying to get pregnant -breast-feeding How should I use this medicine? This medicine is for injection under the skin. If you get this medicine at home, you will be taught how to prepare and give the pre-filled syringe or how to use the On-body Injector. Refer to the patient Instructions for Use for detailed instructions. Use exactly as directed. Take your medicine at regular intervals. Do not take your medicine more often than directed. It is important that you put your used needles and syringes in a special sharps container. Do not put them in a trash can. If you do not have a sharps container, call your pharmacist or healthcare provider to get one. Talk to your pediatrician regarding the use of this medicine in children. Special care may be needed. Overdosage: If you think you have taken too much of this medicine contact a poison control center or emergency room at once. NOTE: This medicine is only for you. Do not share this medicine with others. What if I miss a dose? It is  important not to miss your dose. Call your doctor or health care professional if you miss your dose. If you miss a dose due to an On-body Injector failure or leakage, a new dose should be administered as soon as possible using a single prefilled syringe for manual use. What may interact with this medicine? Interactions have not been studied. Give your health care provider a list of all the medicines, herbs, non-prescription drugs, or dietary supplements you use. Also tell them if you smoke, drink alcohol, or use illegal drugs. Some items may interact with your medicine. This list may not describe all possible interactions. Give your health care provider a list of all the medicines, herbs, non-prescription drugs, or dietary supplements you use. Also tell them if you smoke, drink alcohol, or use illegal drugs. Some items may interact with your medicine. What should I watch for while using this medicine? You may need blood work done while you are taking this medicine. If you are going to need a MRI, CT scan, or other procedure, tell your doctor that you are using this medicine (On-Body Injector only). What side effects may I notice from receiving this medicine? Side effects that you should report to your doctor or health care professional as soon as possible: -allergic reactions like skin rash, itching or hives, swelling of the face, lips, or tongue -dizziness -fever -pain, redness, or irritation at site where injected -pinpoint red spots on the skin -shortness of breath or breathing problems -stomach or side pain, or pain at the shoulder -swelling -tiredness -trouble passing urine Side effects that usually do not require medical attention (report to your doctor   or health care professional if they continue or are bothersome): -bone pain -muscle pain This list may not describe all possible side effects. Call your doctor for medical advice about side effects. You may report side effects to FDA at  1-800-FDA-1088. Where should I keep my medicine? Keep out of the reach of children. Store pre-filled syringes in a refrigerator between 2 and 8 degrees C (36 and 46 degrees F). Do not freeze. Keep in carton to protect from light. Throw away this medicine if it is left out of the refrigerator for more than 48 hours. Throw away any unused medicine after the expiration date. NOTE: This sheet is a summary. It may not cover all possible information. If you have questions about this medicine, talk to your doctor, pharmacist, or health care provider.  2015, Elsevier/Gold Standard. (2014-03-10 16:14:05) Fluorouracil, 5FU; Diclofenac topical cream What is this medicine? FLUOROURACIL; DICLOFENAC (flure oh YOOR a sil; dye KLOE fen ak) is a combination of a topical chemotherapy agent and non-steroidal anti-inflammatory drug (NSAID). It is used on the skin to treat skin cancer and skin conditions that could become cancer. This medicine may be used for other purposes; ask your health care provider or pharmacist if you have questions. COMMON BRAND NAME(S): FLUORAC What should I tell my health care provider before I take this medicine? They need to know if you have any of these conditions: -bleeding problems -cigarette smoker -DPD enzyme deficiency -heart disease -high blood pressure -if you frequently drink alcohol containing drinks -kidney disease -liver disease -open or infected skin -stomach problems -swelling or open sores at the treatment site -recent or planned coronary artery bypass graft (CABG) surgery -an unusual or allergic reaction to fluorouracil, diclofenac, aspirin, other NSAIDs, other medicines, foods, dyes, or preservatives -pregnant or trying to get pregnant -breast-feeding How should I use this medicine? This medicine is only for use on the skin. Follow the directions on the prescription label. Wash hands before and after use. Wash affected area and gently pat dry. To apply this  medicine use a cotton-tipped applicator, or use gloves if applying with fingertips. If applied with unprotected fingertips, it is very important to wash your hands well after you apply this medicine. Avoid applying to the eyes, nose, or mouth. Apply enough medicine to cover the affected area. You can cover the area with a light gauze dressing, but do not use tight or air-tight dressings. Finish the full course prescribed by your doctor or health care professional, even if you think your condition is better. Do not stop taking except on the advice of your doctor or health care professional. Talk to your pediatrician regarding the use of this medicine in children. Special care may be needed. Overdosage: If you think you've taken too much of this medicine contact a poison control center or emergency room at once. Overdosage: If you think you have taken too much of this medicine contact a poison control center or emergency room at once. NOTE: This medicine is only for you. Do not share this medicine with others. What if I miss a dose? If you miss a dose, apply it as soon as you can. If it is almost time for your next dose, only use that dose. Do not apply extra doses. Contact your doctor or health care professional if you miss more than one dose. What may interact with this medicine? Interactions are not expected. Do not use any other skin products without telling your doctor or health care professional. This list   may not describe all possible interactions. Give your health care provider a list of all the medicines, herbs, non-prescription drugs, or dietary supplements you use. Also tell them if you smoke, drink alcohol, or use illegal drugs. Some items may interact with your medicine. What should I watch for while using this medicine? Visit your doctor or health care professional for checks on your progress. You will need to use this medicine for 2 to 6 weeks. This may be longer depending on the condition  being treated. You may not see full healing for another 1 to 2 months after you stop using the medicine. Treated areas of skin can look unsightly during and for several weeks after treatment with this medicine. This medicine can make you more sensitive to the sun. Keep out of the sun. If you cannot avoid being in the sun, wear protective clothing and use sunscreen. Do not use sun lamps or tanning beds/booths. Where should I keep my What side effects may I notice from receiving this medicine? Side effects that you should report to your doctor or health care professional as soon as possible: -allergic reactions like skin rash, itching or hives, swelling of the face, lips, or tongue -black or bloody stools, blood in the urine or vomit -blurred vision -chest pain -difficulty breathing or wheezing -redness, blistering, peeling or loosening of the skin, including inside the mouth -severe redness and swelling of normal skin -slurred speech or weakness on one side of the body -trouble passing urine or change in the amount of urine -unexplained weight gain or swelling -unusually weak or tired -yellowing of eyes or skin Side effects that usually do not require medical attention (Report these to your doctor or health care professional if they continue or are bothersome.): -increased sensitivity of the skin to sun and ultraviolet light -pain and burning of the affected area -scaling or swelling of the affected area -skin rash, itching of the affected area -tenderness This list may not describe all possible side effects. Call your doctor for medical advice about side effects. You may report side effects to FDA at 1-800-FDA-1088. Where should I keep my medicine? Keep out of the reach of children. Store at room temperature between 20 and 25 degrees C (68 and 77 degrees F). Throw away any unused medicine after the expiration date. NOTE: This sheet is a summary. It may not cover all possible information.  If you have questions about this medicine, talk to your doctor, pharmacist, or health care provider.  2015, Elsevier/Gold Standard. (2014-04-11 11:09:58)  

## 2015-02-22 ENCOUNTER — Encounter: Payer: Self-pay | Admitting: Hematology

## 2015-02-22 ENCOUNTER — Ambulatory Visit: Payer: BC Managed Care – PPO

## 2015-02-22 ENCOUNTER — Encounter: Payer: Self-pay | Admitting: *Deleted

## 2015-02-22 ENCOUNTER — Other Ambulatory Visit (HOSPITAL_BASED_OUTPATIENT_CLINIC_OR_DEPARTMENT_OTHER): Payer: BC Managed Care – PPO

## 2015-02-22 ENCOUNTER — Ambulatory Visit (HOSPITAL_BASED_OUTPATIENT_CLINIC_OR_DEPARTMENT_OTHER): Payer: BC Managed Care – PPO | Admitting: Hematology

## 2015-02-22 ENCOUNTER — Telehealth: Payer: Self-pay | Admitting: *Deleted

## 2015-02-22 VITALS — BP 117/68 | HR 79 | Temp 98.3°F | Resp 18 | Ht 63.0 in | Wt 246.6 lb

## 2015-02-22 DIAGNOSIS — C187 Malignant neoplasm of sigmoid colon: Secondary | ICD-10-CM

## 2015-02-22 DIAGNOSIS — D509 Iron deficiency anemia, unspecified: Secondary | ICD-10-CM

## 2015-02-22 DIAGNOSIS — R52 Pain, unspecified: Secondary | ICD-10-CM

## 2015-02-22 DIAGNOSIS — D500 Iron deficiency anemia secondary to blood loss (chronic): Secondary | ICD-10-CM

## 2015-02-22 DIAGNOSIS — C189 Malignant neoplasm of colon, unspecified: Secondary | ICD-10-CM

## 2015-02-22 LAB — CBC & DIFF AND RETIC
BASO%: 0.2 % (ref 0.0–2.0)
Basophils Absolute: 0 10*3/uL (ref 0.0–0.1)
EOS%: 1.4 % (ref 0.0–7.0)
Eosinophils Absolute: 0.1 10*3/uL (ref 0.0–0.5)
HCT: 37.3 % (ref 34.8–46.6)
HGB: 11.9 g/dL (ref 11.6–15.9)
Immature Retic Fract: 32.3 % — ABNORMAL HIGH (ref 1.60–10.00)
LYMPH%: 43.7 % (ref 14.0–49.7)
MCH: 25.2 pg (ref 25.1–34.0)
MCHC: 31.9 g/dL (ref 31.5–36.0)
MCV: 78.9 fL — ABNORMAL LOW (ref 79.5–101.0)
MONO#: 0.2 10*3/uL (ref 0.1–0.9)
MONO%: 4.8 % (ref 0.0–14.0)
NEUT#: 2.5 10*3/uL (ref 1.5–6.5)
NEUT%: 49.9 % (ref 38.4–76.8)
Platelets: 87 10*3/uL — ABNORMAL LOW (ref 145–400)
RBC: 4.73 10*6/uL (ref 3.70–5.45)
RDW: 25 % — ABNORMAL HIGH (ref 11.2–14.5)
Retic %: 2.53 % — ABNORMAL HIGH (ref 0.70–2.10)
Retic Ct Abs: 119.67 10*3/uL — ABNORMAL HIGH (ref 33.70–90.70)
WBC: 5 10*3/uL (ref 3.9–10.3)
lymph#: 2.2 10*3/uL (ref 0.9–3.3)

## 2015-02-22 LAB — COMPREHENSIVE METABOLIC PANEL (CC13)
ALT: 22 U/L (ref 0–55)
AST: 21 U/L (ref 5–34)
Albumin: 3.7 g/dL (ref 3.5–5.0)
Alkaline Phosphatase: 84 U/L (ref 40–150)
Anion Gap: 9 mEq/L (ref 3–11)
BUN: 10 mg/dL (ref 7.0–26.0)
CO2: 25 mEq/L (ref 22–29)
Calcium: 9.5 mg/dL (ref 8.4–10.4)
Chloride: 108 mEq/L (ref 98–109)
Creatinine: 0.8 mg/dL (ref 0.6–1.1)
EGFR: >90 mL/min/{1.73_m2} (ref >90)
Glucose: 124 mg/dl (ref 70–140)
Potassium: 4 mEq/L (ref 3.5–5.1)
Sodium: 142 mEq/L (ref 136–145)
Total Bilirubin: 0.41 mg/dL (ref 0.20–1.20)
Total Protein: 7.2 g/dL (ref 6.4–8.3)

## 2015-02-22 MED ORDER — OXYCODONE HCL 5 MG PO TABS: 5.0000 mg | ORAL_TABLET | Freq: Four times a day (QID) | ORAL | Status: DC | PRN

## 2015-02-22 NOTE — Telephone Encounter (Signed)
Per staff message and POF I have scheduled appts. Advised scheduler of appts and to move labs. JMW  

## 2015-02-22 NOTE — Progress Notes (Signed)
[facility]  Telephone:([phone] Fax:([phone]) [place] Note   Patient Care Team: Lind Covert, MD as PCP - General Juanita Craver, MD as Consulting Physician (Gastroenterology) [name], MD as Consulting Physician (General Surgery) Truitt Merle, MD as Consulting Physician (Hematology) 02/22/2015  CHIEF COMPLAINTS Follow up colon cancer   DIAGNOSIS:  Colon cancer   Staging form: Colon and Rectum, AJCC 7th Edition     Clinical: Stage IIIB (T3, N1a, M0) - Signed by Truitt Merle, MD on 12/28/2014     Colon cancer   10/10/2014 Imaging CT abdomen and pelvis: Eccentric wall thickening along the sigmoid colon, worrisome for early colonic adenocarcinoma. No findings suspicious for metastatic disease. CT chest (-)      11/14/2014 Initial Diagnosis Colon cancer   11/14/2014 Pathologic Stage pT3pN1pMx, G2, tumor invading through the muscular propria into pericolonic fatty tissue.  LVI (-), PNI (-). 1/28 node positive, margins negative.    11/14/2014 Surgery sigmoid colon segmental resection, margins (-).     HISTORY OF INITIAL PRESENTING ILLNESS:  Jenny [name] 54 y.o. female without a significant past medical history, who is referred by her surgeon Dr. Marcello Moores to discuss adjuvant chemotherapy for her resected colon cancer.  She presented with abdominal pain for 8-9 month, which was related to position. She noticed bloody stool in Aug 2015. She was seen by PCP, and was referred to GI Dr. Collene Mares, and underwent colonoscopy which showed a mass at sigmoid colon (per patient, I do not have the report). Biopsy showed adenocarcinoma. She was then referred to surgeon Dr. Marcello Moores and underwent sigmoid colon segmentectomy on 11/14/2014.   TREATMENT: [id number]  started on 12/28/2014   INTERIM HISTORY: Jenny [name] returns for follow-up and 5th cycle chemo. She has been more fatigued after the last chemo, it took longer time (about 10 days) to recover with the last cycle,  she also had nausea and low appetite for 3 days, no vomiting, her appetite much better this week. No fever or chills. Her neuropathy is stable, G1, no pain or weakness.    MEDICAL HISTORY:  Past Medical History  Diagnosis Date  . Enlarged thyroid   . Anemia   . Cancer     colon cancer     SURGICAL HISTORY: Past Surgical History  Procedure Laterality Date  . Tonsillectomy    . Cervical spine surgery  06/17/2012    C5-C7 ACDF  . Cesarean section    . Partial hysterectomy    . Portacath placement Left 12/22/2014    Procedure: INSERTION PORT-A-CATH LEFT SUBCLAVIAN;  Surgeon: [name], MD;  Location: [facility];  Service: General;  Laterality: Left;    SOCIAL HISTORY: History   Social History  . Marital Status: Married    Spouse Name: N/A    Number of Children: 2  . Years of Education: N/A   Occupational History  . Not on file.   Social History Main Topics  . Smoking status: Never Smoker   . Smokeless tobacco: Never Used  . Alcohol Use: No  . Drug Use: No  . Sexual Activity: Not on file    P2G2, one child was murdered. She is a Paramedic.   FAMILY HISTORY: Family History  Problem Relation Age of Onset  . Colon cancer Neg Hx   . Liver Cancer Brother        . Hypertension Mother     ALLERGIES:  is allergic to other and shellfish allergy.  MEDICATIONS:  Current Outpatient Prescriptions  Medication Sig Dispense Refill  . diphenhydrAMINE (BENADRYL) 12.5 MG/5ML liquid Take 12.5-25 mg by mouth every 4 (four) hours as needed for itching or allergies.    . hydrocortisone cream 0.5 % Apply 1 application topically 2 (two) times daily as needed for itching.    . lidocaine-prilocaine (EMLA) cream Apply 1 application topically as needed. 30 g 0  . ondansetron (ZOFRAN) 8 MG tablet Take 1 by mouth twice a day for 2 days after chemotherapy and then twice daily as needed 30 tablet 1  . triamcinolone (KENALOG) 0.025 % ointment Apply 1 application topically 2 (two)  times daily. 30 g 1  . Hydrocodone-Acetaminophen (VICODIN) 5-300 MG TABS Take 1 tablet by mouth every 21 ( twenty-one) days. (Patient not taking: Reported on 02/22/2015) 5 each 0   No current facility-administered medications for this visit.    REVIEW OF SYSTEMS:   Constitutional: Denies fevers, chills or abnormal night sweats Eyes: Denies blurriness of vision, double vision or watery eyes Ears, nose, mouth, throat, and face: Denies mucositis or sore throat Respiratory: Denies cough, dyspnea or wheezes Cardiovascular: Denies palpitation, chest discomfort or lower extremity swelling Gastrointestinal:  Denies nausea, heartburn or change in bowel habits Skin: Denies abnormal skin rashes Lymphatics: Denies new lymphadenopathy or easy bruising Neurological:Denies numbness, tingling or new weaknesses Behavioral/Psych: Mood is stable, no new changes  All other systems were reviewed with the patient and are negative.  PHYSICAL EXAMINATION: ECOG PERFORMANCE STATUS: 1 Vital sign was reviewed, normal.  GENERAL:alert, no distress and comfortable SKIN: skin color, texture, turgor are normal, no rashes or significant lesions EYES: normal, conjunctiva are pink and non-injected, sclera clear OROPHARYNX:no exudate, no erythema and lips, buccal mucosa, and tongue normal  NECK: supple, thyroid normal size, non-tender, without nodularity LYMPH:  no palpable lymphadenopathy in the cervical, axillary or inguinal LUNGS: clear to auscultation and percussion with normal breathing effort HEART: regular rate & rhythm and no murmurs and no lower extremity edema ABDOMEN:abdomen soft, non-tender and normal bowel sounds. Surgical wound has well-healed Musculoskeletal:no cyanosis of digits and no clubbing  PSYCH: alert & oriented x 3 with fluent speech NEURO: no focal motor/sensory deficits  LABORATORY DATA:  I have reviewed the data as listed CBC Latest Ref Rng 02/08/2015 01/25/2015 01/11/2015  WBC 3.9 - 10.3  10e3/uL 3.3(L) 6.6 3.4(L)  Hemoglobin 11.6 - 15.9 g/dL 11.7 11.8 10.8(L)  Hematocrit 34.8 - 46.6 % 36.6 37.3 35.1  Platelets 145 - 400 10e3/uL 160 128(L) 273    CMP Latest Ref Rng 02/08/2015 01/25/2015 01/11/2015  Glucose 70 - 140 mg/dl 108 133 110  BUN 7.0 - 26.0 mg/dL 11.5 11.1 11.9  Creatinine 0.6 - 1.1 mg/dL 0.8 0.8 0.8  Sodium 136 - 145 mEq/L 144 142 142  Potassium 3.5 - 5.1 mEq/L 4.1 3.9 4.0  Chloride 96 - 112 mEq/L - - -  CO2 22 - 29 mEq/L $Remove'23 24 24  '[id number]$ Calcium 8.4 - 10.4 mg/dL 9.8 9.1 9.5  Total Protein 6.4 - 8.3 g/dL 7.6 7.1 7.4  Total Bilirubin 0.20 - 1.20 mg/dL 0.37 0.42 0.41  Alkaline Phos 40 - 150 U/L 71 86 66  AST 5 - 34 U/L 35(H) 15 14  ALT 0 - 55 U/L 35 20 14     PATHOLOGY REPORT 11/14/2014 Diagnosis(continued) 1. Colon, segmental resection for tumor, sigmoid - INVASIVE ADENOCARCINOMA, INVADING THROUGH THE MUSCULARIS PROPRIA INTO PERICOLONIC FATTY TISSUE. - ONE OF TWENTY-EIGHT LYMPH NODES, POSITIVE FOR METASTATIC CARCINOMA (1/28). - RESECTION MARGINS, NEGATIVE  FOR ATYPIA OR MALIGNANCY - RESECTION MARGINS, NEGATIVE FOR ATYPIA OR MALIGNANCY. 2. Colon, resection margin (donut), colon final distal margin - BENIGN COLONIC MUCOSA, NO EVIDENCE OF MALIGNANCY. Microscopic Comment 1. COLON Specimen: Sigmoid colon Procedure: Segmental resection Tumor site: Sigmoid colon Specimen integrity: Intact Macroscopic intactness of mesorectum: N/A Macroscopic tumor perforation: No Invasive tumor: Maximum size: 2.7 cm, gross measurement Histologic type(s): Invasive adenocarcinoma Histologic grade and differentiation: G2: moderately differentiated/low grade Type of polyp tumor arose from: Tubular adenoma Microscopic extension of invasive tumor: Invading through the muscularis propria into pericolonic fatty tissue. Lymph-Vascular invasion: Not identified Peri-neural invasion: Not identified Tumor deposit(s) (discontinuous extramural extension): N/A Resection margins: Negative Open  margin: 7.1 cm Stapled margin: 5.5 cm Mesenteric margin (sigmoid and transverse): 9 cm Treatment effect (neo-adjuvant therapy): No Additional polyp(s): N/A Non-neoplastic findings: N/A Lymph nodes: number examined 28; number positive: 1 Pathologic Staging: pT3, pN1a, pMX Ancillary studies: MMR stains will be performed and an addendum report will follow. MSI testing will be performed at an outside institution and the results will be available in EPIC. (HCL:kh 11-15-14)  TUMOR MSI: stable (by PCR),  MMR: NORMAL   RADIOGRAPHIC STUDIES: I have personally reviewed the radiological images as listed and agreed with the findings in the report.  CT chest 10/25/2014 IMPRESSION: No CT findings for pulmonary metastatic disease. No mediastinal or hilar mass or adenopathy.  CT abdomen Pelvis w contrast 10/10/2014 Eccentric wall thickening along the sigmoid colon, likely corresponding to the finding on colonoscopy, worrisome for early colonic adenocarcinoma.  No findings suspicious for metastatic disease.  ASSESSMENT & PLAN:  54 year old African-American female, without significant past medical history, presents with abdominal pain and bloody bowel movement, anemia, was found to have a sigmoid adenocarcinoma, status post complete surgical resection.   1. Sigmoid colon adenocarcinoma, G2, pT3pN1aM0, stage IIIB, MSI stable  -She is on adjuvant FOLFOX for total of 12 cycles planned. The goal of treatment is curative.  -She tolerated the first 4 cycle well with some expected side effects, but fatigued, and anorexia seems getting worse with each cycle. -Due to her some cytopenia, I'll hold chemotherapy today. Postpone 2 next week. -She has developed G1 mild neuropathy, stable, we'll watch it closely. -Due to her thrombocytopenia, I will reduce her chemotherapy dose by 10% with next Cycle.    2. Abdominal pain, secondary to surgery, body pain, secondary to chemo? -Her abdominal pain has  resolved.  -She states she has diffuse body pain after chemotherapy, she takes Tylenol and ibuprofen but not sufficient. -I give her her refill of oxycodone today.  3. Microcytic anemia, likely iron deficient anemia secondary to colon cancer -Her serum iron and saturation were low. Likely from her colon cancer relates.  -Her anemia improved after IV Feraheme.  -Repeat her ferritin level.  Plan -hold chemo today, postpone to next week with 10% dose reduction.  -I will see you before cycle 6 on 3/23  All questions were answered. The patient knows to call the clinic with any problems,  questions or concerns.  I spent 20 minutes counseling the patient face to face. The total time spent in the appointment was 25 minutes and more than 50% was on counseling.     Truitt Merle, MD 02/22/2015 11:02 AM

## 2015-02-23 ENCOUNTER — Telehealth: Payer: Self-pay | Admitting: Hematology

## 2015-02-23 NOTE — Telephone Encounter (Signed)
Confirm appointment for 03/09. Will get calendar for March at appointment.

## 2015-02-24 ENCOUNTER — Ambulatory Visit: Payer: BC Managed Care – PPO

## 2015-03-01 ENCOUNTER — Ambulatory Visit (HOSPITAL_BASED_OUTPATIENT_CLINIC_OR_DEPARTMENT_OTHER): Payer: BC Managed Care – PPO

## 2015-03-01 ENCOUNTER — Encounter: Payer: Self-pay | Admitting: Hematology

## 2015-03-01 ENCOUNTER — Other Ambulatory Visit: Payer: BC Managed Care – PPO

## 2015-03-01 ENCOUNTER — Other Ambulatory Visit (HOSPITAL_BASED_OUTPATIENT_CLINIC_OR_DEPARTMENT_OTHER): Payer: BC Managed Care – PPO

## 2015-03-01 DIAGNOSIS — C189 Malignant neoplasm of colon, unspecified: Secondary | ICD-10-CM

## 2015-03-01 DIAGNOSIS — C187 Malignant neoplasm of sigmoid colon: Secondary | ICD-10-CM

## 2015-03-01 DIAGNOSIS — D509 Iron deficiency anemia, unspecified: Secondary | ICD-10-CM

## 2015-03-01 DIAGNOSIS — D500 Iron deficiency anemia secondary to blood loss (chronic): Secondary | ICD-10-CM

## 2015-03-01 DIAGNOSIS — Z5111 Encounter for antineoplastic chemotherapy: Secondary | ICD-10-CM

## 2015-03-01 LAB — CBC & DIFF AND RETIC
BASO%: 0.4 % (ref 0.0–2.0)
Basophils Absolute: 0 10*3/uL (ref 0.0–0.1)
EOS%: 1.2 % (ref 0.0–7.0)
Eosinophils Absolute: 0.1 10*3/uL (ref 0.0–0.5)
HCT: 40.6 % (ref 34.8–46.6)
HGB: 13 g/dL (ref 11.6–15.9)
Immature Retic Fract: 10.4 % — ABNORMAL HIGH (ref 1.60–10.00)
LYMPH%: 40.2 % (ref 14.0–49.7)
MCH: 25.5 pg (ref 25.1–34.0)
MCHC: 32 g/dL (ref 31.5–36.0)
MCV: 79.8 fL (ref 79.5–101.0)
MONO#: 0.4 10*3/uL (ref 0.1–0.9)
MONO%: 7.7 % (ref 0.0–14.0)
NEUT#: 2.5 10*3/uL (ref 1.5–6.5)
NEUT%: 50.5 % (ref 38.4–76.8)
Platelets: 179 10*3/uL (ref 145–400)
RBC: 5.09 10*6/uL (ref 3.70–5.45)
RDW: 25 % — ABNORMAL HIGH (ref 11.2–14.5)
Retic %: 1.29 % (ref 0.70–2.10)
Retic Ct Abs: 65.66 10*3/uL (ref 33.70–90.70)
WBC: 4.9 10*3/uL (ref 3.9–10.3)
lymph#: 2 10*3/uL (ref 0.9–3.3)

## 2015-03-01 LAB — COMPREHENSIVE METABOLIC PANEL (CC13)
ALT: 33 U/L (ref 0–55)
AST: 30 U/L (ref 5–34)
Albumin: 3.9 g/dL (ref 3.5–5.0)
Alkaline Phosphatase: 81 U/L (ref 40–150)
Anion Gap: 12 mEq/L — ABNORMAL HIGH (ref 3–11)
BUN: 12.4 mg/dL (ref 7.0–26.0)
CO2: 22 mEq/L (ref 22–29)
Calcium: 9.7 mg/dL (ref 8.4–10.4)
Chloride: 110 mEq/L — ABNORMAL HIGH (ref 98–109)
Creatinine: 0.8 mg/dL (ref 0.6–1.1)
EGFR: >90 mL/min/{1.73_m2} (ref >90)
Glucose: 124 mg/dl (ref 70–140)
Potassium: 3.8 mEq/L (ref 3.5–5.1)
Sodium: 144 mEq/L (ref 136–145)
Total Bilirubin: 0.35 mg/dL (ref 0.20–1.20)
Total Protein: 8 g/dL (ref 6.4–8.3)

## 2015-03-01 LAB — FERRITIN CHCC: Ferritin: 80 ng/ml (ref 9–269)

## 2015-03-01 MED ORDER — DEXTROSE 5 % IV SOLN
76.5000 mg/m2 | Freq: Once | INTRAVENOUS | Status: AC
  Administered 2015-03-01: 170 mg via INTRAVENOUS
  Filled 2015-03-01: qty 34

## 2015-03-01 MED ORDER — LEUCOVORIN CALCIUM INJECTION 350 MG
360.0000 mg/m2 | Freq: Once | INTRAMUSCULAR | Status: AC
  Administered 2015-03-01: 800 mg via INTRAVENOUS
  Filled 2015-03-01: qty 40

## 2015-03-01 MED ORDER — SODIUM CHLORIDE 0.9 % IV SOLN
Freq: Once | INTRAVENOUS | Status: AC
  Administered 2015-03-01: 15:00:00 via INTRAVENOUS
  Filled 2015-03-01: qty 4

## 2015-03-01 MED ORDER — DEXTROSE 5 % IV SOLN
Freq: Once | INTRAVENOUS | Status: AC
  Administered 2015-03-01: 15:00:00 via INTRAVENOUS

## 2015-03-01 MED ORDER — SODIUM CHLORIDE 0.9 % IV SOLN
2160.0000 mg/m2 | INTRAVENOUS | Status: DC
  Administered 2015-03-01: 4800 mg via INTRAVENOUS
  Filled 2015-03-01: qty 96

## 2015-03-01 MED ORDER — FLUOROURACIL CHEMO INJECTION 2.5 GM/50ML
360.0000 mg/m2 | Freq: Once | INTRAVENOUS | Status: AC
  Administered 2015-03-01: 800 mg via INTRAVENOUS
  Filled 2015-03-01: qty 16

## 2015-03-01 NOTE — Patient Instructions (Signed)
[facility] Discharge Instructions for Patients Receiving Chemotherapy  Today you received the following chemotherapy agents:  Oxaliplatin, Leucovorin and 5FU  To help prevent nausea and vomiting after your treatment, we encourage you to take your nausea medication as ordered per MD.   If you develop nausea and vomiting that is not controlled by your nausea medication, call the clinic.   BELOW ARE SYMPTOMS THAT SHOULD BE REPORTED IMMEDIATELY:  *FEVER GREATER THAN 100.5 F  *CHILLS WITH OR WITHOUT FEVER  NAUSEA AND VOMITING THAT IS NOT CONTROLLED WITH YOUR NAUSEA MEDICATION  *UNUSUAL SHORTNESS OF BREATH  *UNUSUAL BRUISING OR BLEEDING  TENDERNESS IN MOUTH AND THROAT WITH OR WITHOUT PRESENCE OF ULCERS  *URINARY PROBLEMS  *BOWEL PROBLEMS  UNUSUAL RASH Items with * indicate a potential emergency and should be followed up as soon as possible.  Feel free to call the clinic you have any questions or concerns. The clinic phone number is [phone].

## 2015-03-01 NOTE — Progress Notes (Signed)
Placed disability forms on the nurse for the dr. Burr Medico

## 2015-03-01 NOTE — Progress Notes (Signed)
Recd disability forms back from Dr. Burr Medico. Mrs Jenny [name] will give the patient her completed forms.

## 2015-03-02 ENCOUNTER — Telehealth: Payer: Self-pay | Admitting: *Deleted

## 2015-03-02 NOTE — Telephone Encounter (Signed)
Patient called stating that she has a headache rating 10 on a scale from 0-10. Patient has taken oxycodone with no relief. Advised patient to take Tylenol 500 mg every 6 hours. If headache gets worse she should call her PCP. Patient verbalized understanding.

## 2015-03-03 ENCOUNTER — Ambulatory Visit: Payer: BC Managed Care – PPO

## 2015-03-03 ENCOUNTER — Ambulatory Visit (HOSPITAL_BASED_OUTPATIENT_CLINIC_OR_DEPARTMENT_OTHER): Payer: BC Managed Care – PPO

## 2015-03-03 DIAGNOSIS — C187 Malignant neoplasm of sigmoid colon: Secondary | ICD-10-CM

## 2015-03-03 DIAGNOSIS — C189 Malignant neoplasm of colon, unspecified: Secondary | ICD-10-CM

## 2015-03-03 DIAGNOSIS — Z452 Encounter for adjustment and management of vascular access device: Secondary | ICD-10-CM

## 2015-03-03 MED ORDER — HEPARIN SOD (PORK) LOCK FLUSH 100 UNIT/ML IV SOLN
500.0000 [IU] | Freq: Once | INTRAVENOUS | Status: AC | PRN
  Administered 2015-03-03: 500 [IU]
  Filled 2015-03-03: qty 5

## 2015-03-03 MED ORDER — SODIUM CHLORIDE 0.9 % IJ SOLN
10.0000 mL | INTRAMUSCULAR | Status: DC | PRN
  Administered 2015-03-03: 10 mL
  Filled 2015-03-03: qty 10

## 2015-03-03 NOTE — Patient Instructions (Signed)
Fluorouracil, 5FU; Diclofenac topical cream What is this medicine? FLUOROURACIL; DICLOFENAC (flure oh YOOR a sil; dye KLOE fen ak) is a combination of a topical chemotherapy agent and non-steroidal anti-inflammatory drug (NSAID). It is used on the skin to treat skin cancer and skin conditions that could become cancer. This medicine may be used for other purposes; ask your health care provider or pharmacist if you have questions. COMMON BRAND NAME(S): FLUORAC What should I tell my health care provider before I take this medicine? They need to know if you have any of these conditions: -bleeding problems -cigarette smoker -DPD enzyme deficiency -heart disease -high blood pressure -if you frequently drink alcohol containing drinks -kidney disease -liver disease -open or infected skin -stomach problems -swelling or open sores at the treatment site -recent or planned coronary artery bypass graft (CABG) surgery -an unusual or allergic reaction to fluorouracil, diclofenac, aspirin, other NSAIDs, other medicines, foods, dyes, or preservatives -pregnant or trying to get pregnant -breast-feeding How should I use this medicine? This medicine is only for use on the skin. Follow the directions on the prescription label. Wash hands before and after use. Wash affected area and gently pat dry. To apply this medicine use a cotton-tipped applicator, or use gloves if applying with fingertips. If applied with unprotected fingertips, it is very important to wash your hands well after you apply this medicine. Avoid applying to the eyes, nose, or mouth. Apply enough medicine to cover the affected area. You can cover the area with a light gauze dressing, but do not use tight or air-tight dressings. Finish the full course prescribed by your doctor or health care professional, even if you think your condition is better. Do not stop taking except on the advice of your doctor or health care professional. Talk to your  pediatrician regarding the use of this medicine in children. Special care may be needed. Overdosage: If you think you've taken too much of this medicine contact a poison control center or emergency room at once. Overdosage: If you think you have taken too much of this medicine contact a poison control center or emergency room at once. NOTE: This medicine is only for you. Do not share this medicine with others. What if I miss a dose? If you miss a dose, apply it as soon as you can. If it is almost time for your next dose, only use that dose. Do not apply extra doses. Contact your doctor or health care professional if you miss more than one dose. What may interact with this medicine? Interactions are not expected. Do not use any other skin products without telling your doctor or health care professional. This list may not describe all possible interactions. Give your health care provider a list of all the medicines, herbs, non-prescription drugs, or dietary supplements you use. Also tell them if you smoke, drink alcohol, or use illegal drugs. Some items may interact with your medicine. What should I watch for while using this medicine? Visit your doctor or health care professional for checks on your progress. You will need to use this medicine for 2 to 6 weeks. This may be longer depending on the condition being treated. You may not see full healing for another 1 to 2 months after you stop using the medicine. Treated areas of skin can look unsightly during and for several weeks after treatment with this medicine. This medicine can make you more sensitive to the sun. Keep out of the sun. If you cannot avoid being in   the sun, wear protective clothing and use sunscreen. Do not use sun lamps or tanning beds/booths. Where should I keep my What side effects may I notice from receiving this medicine? Side effects that you should report to your doctor or health care professional as soon as possible: -allergic  reactions like skin rash, itching or hives, swelling of the face, lips, or tongue -black or bloody stools, blood in the urine or vomit -blurred vision -chest pain -difficulty breathing or wheezing -redness, blistering, peeling or loosening of the skin, including inside the mouth -severe redness and swelling of normal skin -slurred speech or weakness on one side of the body -trouble passing urine or change in the amount of urine -unexplained weight gain or swelling -unusually weak or tired -yellowing of eyes or skin Side effects that usually do not require medical attention (Report these to your doctor or health care professional if they continue or are bothersome.): -increased sensitivity of the skin to sun and ultraviolet light -pain and burning of the affected area -scaling or swelling of the affected area -skin rash, itching of the affected area -tenderness This list may not describe all possible side effects. Call your doctor for medical advice about side effects. You may report side effects to FDA at 1-800-FDA-1088. Where should I keep my medicine? Keep out of the reach of children. Store at room temperature between 20 and 25 degrees C (68 and 77 degrees F). Throw away any unused medicine after the expiration date. NOTE: This sheet is a summary. It may not cover all possible information. If you have questions about this medicine, talk to your doctor, pharmacist, or health care provider.  2015, [employer]vier/Gold Standard. (2014-04-11 11:09:58)  

## 2015-03-08 ENCOUNTER — Ambulatory Visit: Payer: BC Managed Care – PPO

## 2015-03-15 ENCOUNTER — Encounter: Payer: Self-pay | Admitting: Hematology

## 2015-03-15 ENCOUNTER — Ambulatory Visit (HOSPITAL_BASED_OUTPATIENT_CLINIC_OR_DEPARTMENT_OTHER): Payer: BC Managed Care – PPO | Admitting: Hematology

## 2015-03-15 ENCOUNTER — Ambulatory Visit (HOSPITAL_BASED_OUTPATIENT_CLINIC_OR_DEPARTMENT_OTHER): Payer: BC Managed Care – PPO

## 2015-03-15 ENCOUNTER — Other Ambulatory Visit (HOSPITAL_BASED_OUTPATIENT_CLINIC_OR_DEPARTMENT_OTHER): Payer: BC Managed Care – PPO

## 2015-03-15 ENCOUNTER — Telehealth: Payer: Self-pay | Admitting: *Deleted

## 2015-03-15 ENCOUNTER — Telehealth: Payer: Self-pay | Admitting: Hematology

## 2015-03-15 VITALS — BP 114/74 | HR 73 | Temp 98.2°F | Resp 18 | Ht 63.0 in | Wt 246.4 lb

## 2015-03-15 DIAGNOSIS — G620 Drug-induced polyneuropathy: Secondary | ICD-10-CM

## 2015-03-15 DIAGNOSIS — D6481 Anemia due to antineoplastic chemotherapy: Secondary | ICD-10-CM | POA: Diagnosis not present

## 2015-03-15 DIAGNOSIS — D509 Iron deficiency anemia, unspecified: Secondary | ICD-10-CM

## 2015-03-15 DIAGNOSIS — C187 Malignant neoplasm of sigmoid colon: Secondary | ICD-10-CM

## 2015-03-15 DIAGNOSIS — C189 Malignant neoplasm of colon, unspecified: Secondary | ICD-10-CM

## 2015-03-15 DIAGNOSIS — G893 Neoplasm related pain (acute) (chronic): Secondary | ICD-10-CM | POA: Diagnosis not present

## 2015-03-15 DIAGNOSIS — Z5111 Encounter for antineoplastic chemotherapy: Secondary | ICD-10-CM | POA: Diagnosis not present

## 2015-03-15 LAB — CBC & DIFF AND RETIC
BASO%: 0.4 % (ref 0.0–2.0)
Basophils Absolute: 0 10*3/uL (ref 0.0–0.1)
EOS%: 3.5 % (ref 0.0–7.0)
Eosinophils Absolute: 0.2 10*3/uL (ref 0.0–0.5)
HCT: 38.9 % (ref 34.8–46.6)
HGB: 12.7 g/dL (ref 11.6–15.9)
Immature Retic Fract: 13 % — ABNORMAL HIGH (ref 1.60–10.00)
LYMPH%: 47.2 % (ref 14.0–49.7)
MCH: 26.1 pg (ref 25.1–34.0)
MCHC: 32.6 g/dL (ref 31.5–36.0)
MCV: 80 fL (ref 79.5–101.0)
MONO#: 0.4 10*3/uL (ref 0.1–0.9)
MONO%: 8.7 % (ref 0.0–14.0)
NEUT#: 1.9 10*3/uL (ref 1.5–6.5)
NEUT%: 40.2 % (ref 38.4–76.8)
Platelets: 166 10*3/uL (ref 145–400)
RBC: 4.86 10*6/uL (ref 3.70–5.45)
RDW: 24.3 % — ABNORMAL HIGH (ref 11.2–14.5)
Retic %: 1.71 % (ref 0.70–2.10)
Retic Ct Abs: 83.11 10*3/uL (ref 33.70–90.70)
WBC: 4.8 10*3/uL (ref 3.9–10.3)
lymph#: 2.3 10*3/uL (ref 0.9–3.3)
nRBC: 0 % (ref 0–0)

## 2015-03-15 LAB — COMPREHENSIVE METABOLIC PANEL (CC13)
ALT: 26 U/L (ref 0–55)
AST: 27 U/L (ref 5–34)
Albumin: 3.8 g/dL (ref 3.5–5.0)
Alkaline Phosphatase: 70 U/L (ref 40–150)
Anion Gap: 10 mEq/L (ref 3–11)
BUN: 12.7 mg/dL (ref 7.0–26.0)
CO2: 24 mEq/L (ref 22–29)
Calcium: 9.3 mg/dL (ref 8.4–10.4)
Chloride: 108 mEq/L (ref 98–109)
Creatinine: 0.7 mg/dL (ref 0.6–1.1)
EGFR: >90 mL/min/{1.73_m2} (ref >90)
Glucose: 118 mg/dl (ref 70–140)
Potassium: 4.1 mEq/L (ref 3.5–5.1)
Sodium: 142 mEq/L (ref 136–145)
Total Bilirubin: 0.36 mg/dL (ref 0.20–1.20)
Total Protein: 7.6 g/dL (ref 6.4–8.3)

## 2015-03-15 MED ORDER — DEXTROSE 5 % IV SOLN
76.5000 mg/m2 | Freq: Once | INTRAVENOUS | Status: AC
  Administered 2015-03-15: 170 mg via INTRAVENOUS
  Filled 2015-03-15: qty 34

## 2015-03-15 MED ORDER — SODIUM CHLORIDE 0.9 % IV SOLN
Freq: Once | INTRAVENOUS | Status: AC
  Administered 2015-03-15: 14:00:00 via INTRAVENOUS
  Filled 2015-03-15: qty 4

## 2015-03-15 MED ORDER — OXYCODONE HCL 5 MG PO TABS: 5.0000 mg | ORAL_TABLET | Freq: Four times a day (QID) | ORAL | Status: DC | PRN

## 2015-03-15 MED ORDER — DEXTROSE 5 % IV SOLN
Freq: Once | INTRAVENOUS | Status: AC
  Administered 2015-03-15: 14:00:00 via INTRAVENOUS

## 2015-03-15 MED ORDER — FLUOROURACIL CHEMO INJECTION 2.5 GM/50ML
360.0000 mg/m2 | Freq: Once | INTRAVENOUS | Status: AC
  Administered 2015-03-15: 800 mg via INTRAVENOUS
  Filled 2015-03-15: qty 16

## 2015-03-15 MED ORDER — GABAPENTIN 100 MG PO CAPS: 100.0000 mg | ORAL_CAPSULE | Freq: Three times a day (TID) | ORAL | Status: DC

## 2015-03-15 MED ORDER — LIDOCAINE-PRILOCAINE 2.5-2.5 % EX CREA: 1.0000 "application " | TOPICAL_CREAM | CUTANEOUS | Status: DC | PRN

## 2015-03-15 MED ORDER — SODIUM CHLORIDE 0.9 % IV SOLN
2160.0000 mg/m2 | INTRAVENOUS | Status: DC
  Administered 2015-03-15: 4800 mg via INTRAVENOUS
  Filled 2015-03-15: qty 96

## 2015-03-15 MED ORDER — LEUCOVORIN CALCIUM INJECTION 350 MG
360.0000 mg/m2 | Freq: Once | INTRAVENOUS | Status: AC
  Administered 2015-03-15: 800 mg via INTRAVENOUS
  Filled 2015-03-15: qty 40

## 2015-03-15 NOTE — Progress Notes (Signed)
[facility]  [phone] Fax:([phone]) [place] Note   Patient Care Team: Lind Covert, MD as PCP - General Juanita Craver, MD as Consulting Physician (Gastroenterology) [name], MD as Consulting Physician (General Surgery) Truitt Merle, MD as Consulting Physician (Hematology) 03/15/2015  CHIEF COMPLAINTS Follow up colon cancer   DIAGNOSIS:  Colon cancer   Staging form: Colon and Rectum, AJCC 7th Edition     Clinical: Stage IIIB (T3, N1a, M0) - Signed by Truitt Merle, MD on 12/28/2014     Colon cancer   10/10/2014 Imaging CT abdomen and pelvis: Eccentric wall thickening along the sigmoid colon, worrisome for early colonic adenocarcinoma. No findings suspicious for metastatic disease. CT chest (-)      11/14/2014 Initial Diagnosis Colon cancer   11/14/2014 Pathologic Stage pT3pN1pMx, G2, tumor invading through the muscular propria into pericolonic fatty tissue.  LVI (-), PNI (-). 1/28 node positive, margins negative.    11/14/2014 Surgery sigmoid colon segmental resection, margins (-).     HISTORY OF INITIAL PRESENTING ILLNESS:  Sula Soda Iwai 54 y.o. female without a significant past medical history, who is referred by her surgeon Dr. Marcello Moores to discuss adjuvant chemotherapy for her resected colon cancer.  She presented with abdominal pain for 8-9 month, which was related to position. She noticed bloody stool in Aug 2015. She was seen by PCP, and was referred to GI Dr. Collene Mares, and underwent colonoscopy which showed a mass at sigmoid colon (per patient, I do not have the report). Biopsy showed adenocarcinoma. She was then referred to surgeon Dr. Marcello Moores and underwent sigmoid colon segmentectomy on 11/14/2014.   TREATMENT: [id number]  started on 12/28/2014, 10% dose reduction due to fatigue and thrombocytopenia from cycle 5  INTERIM HISTORY: Mrs. Mette returns for follow-up and 6 th cycle chemo. She has been more fatigued after the last chemo,  she is still able to do ADLs and some light house work, but she takes a lot of naps and sitting down for rest. She also had a few episodes of diarrhea, resolved on it's own. She had no fever, she does have mild chills. Her neuropathy in fingers and toes are slightly worse but she is able to functions well. She also has headaches and body aches after chemo, for which she takes oxycodone.   MEDICAL HISTORY:  Past Medical History  Diagnosis Date  . Enlarged thyroid   . Anemia   . Cancer     colon cancer     SURGICAL HISTORY: Past Surgical History  Procedure Laterality Date  . Tonsillectomy    . Cervical spine surgery  06/17/2012    C5-C7 ACDF  . Cesarean section    . Partial hysterectomy    . Portacath placement Left 12/22/2014    Procedure: INSERTION PORT-A-CATH LEFT SUBCLAVIAN;  Surgeon: [name], MD;  Location: [facility];  Service: General;  Laterality: Left;    SOCIAL HISTORY: History   Social History  . Marital Status: Married    Spouse Name: N/A    Number of Children: 2  . Years of Education: N/A   Occupational History  . Not on file.   Social History Main Topics  . Smoking status: Never Smoker   . Smokeless tobacco: Never Used  . Alcohol Use: No  . Drug Use: No  . Sexual Activity: Not on file    P2G2, one child was murdered. She is a Paramedic.   FAMILY HISTORY: Family History  Problem Relation  Age of Onset  . Colon cancer Neg Hx   . Liver Cancer Brother        . Hypertension Mother     ALLERGIES:  is allergic to other and shellfish allergy.  MEDICATIONS:  Current Outpatient Prescriptions  Medication Sig Dispense Refill  . diphenhydrAMINE (BENADRYL) 12.5 MG/5ML liquid Take 12.5-25 mg by mouth every 4 (four) hours as needed for itching or allergies.    . Hydrocodone-Acetaminophen (VICODIN) 5-300 MG TABS Take 1 tablet by mouth every 21 ( twenty-one) days. 5 each 0  . hydrocortisone cream 0.5 % Apply 1 application topically 2 (two) times daily  as needed for itching.    . lidocaine-prilocaine (EMLA) cream Apply 1 application topically as needed. 30 g 0  . ondansetron (ZOFRAN) 8 MG tablet Take 1 by mouth twice a day for 2 days after chemotherapy and then twice daily as needed 30 tablet 1  . oxyCODONE (OXY IR/ROXICODONE) 5 MG immediate release tablet Take 1 tablet (5 mg total) by mouth every 6 (six) hours as needed for severe pain. 40 tablet 0  . triamcinolone (KENALOG) 0.025 % ointment Apply 1 application topically 2 (two) times daily. 30 g 1   No current facility-administered medications for this visit.    REVIEW OF SYSTEMS:   Constitutional: Denies fevers, chills or abnormal night sweats Eyes: Denies blurriness of vision, double vision or watery eyes Ears, nose, mouth, throat, and face: Denies mucositis or sore throat Respiratory: Denies cough, dyspnea or wheezes Cardiovascular: Denies palpitation, chest discomfort or lower extremity swelling Gastrointestinal:  Denies nausea, heartburn or change in bowel habits Skin: Denies abnormal skin rashes Lymphatics: Denies new lymphadenopathy or easy bruising Neurological:Denies numbness, tingling or new weaknesses Behavioral/Psych: Mood is stable, no new changes  All other systems were reviewed with the patient and are negative.  PHYSICAL EXAMINATION: ECOG PERFORMANCE STATUS: 2 Vital sign was reviewed, normal.  GENERAL:alert, no distress and comfortable SKIN: skin color, texture, turgor are normal, no rashes or significant lesions EYES: normal, conjunctiva are pink and non-injected, sclera clear OROPHARYNX:no exudate, no erythema and lips, buccal mucosa, and tongue normal  NECK: supple, thyroid normal size, non-tender, without nodularity LYMPH:  no palpable lymphadenopathy in the cervical, axillary or inguinal LUNGS: clear to auscultation and percussion with normal breathing effort HEART: regular rate & rhythm and no murmurs and no lower extremity edema ABDOMEN:abdomen soft,  non-tender and normal bowel sounds. Surgical wound has well-healed Musculoskeletal:no cyanosis of digits and no clubbing  PSYCH: alert & oriented x 3 with fluent speech NEURO: no focal motor/sensory deficits  LABORATORY DATA:  I have reviewed the data as listed CBC Latest Ref Rng 03/15/2015 03/01/2015 02/22/2015  WBC 3.9 - 10.3 10e3/uL 4.8 4.9 5.0  Hemoglobin 11.6 - 15.9 g/dL 12.7 13.0 11.9  Hematocrit 34.8 - 46.6 % 38.9 40.6 37.3  Platelets 145 - 400 10e3/uL 166 179 87(L)    CMP Latest Ref Rng 03/01/2015 02/22/2015 02/08/2015  Glucose 70 - 140 mg/dl 124 124 108  BUN 7.0 - 26.0 mg/dL 12.4 10.0 11.5  Creatinine 0.6 - 1.1 mg/dL 0.8 0.8 0.8  Sodium 136 - 145 mEq/L 144 142 144  Potassium 3.5 - 5.1 mEq/L 3.8 4.0 4.1  Chloride 96 - 112 mEq/L - - -  CO2 22 - 29 mEq/L $Remove'22 25 23  'rCWxZub$ Calcium 8.4 - 10.4 mg/dL 9.7 9.5 9.8  Total Protein 6.4 - 8.3 g/dL 8.0 7.2 7.6  Total Bilirubin 0.20 - 1.20 mg/dL 0.35 0.41 0.37  Alkaline Phos 40 -  150 U/L 81 84 71  AST 5 - 34 U/L 30 21 35(H)  ALT 0 - 55 U/L 33 22 35     PATHOLOGY REPORT 11/14/2014 Diagnosis(continued) 1. Colon, segmental resection for tumor, sigmoid - INVASIVE ADENOCARCINOMA, INVADING THROUGH THE MUSCULARIS PROPRIA INTO PERICOLONIC FATTY TISSUE. - ONE OF TWENTY-EIGHT LYMPH NODES, POSITIVE FOR METASTATIC CARCINOMA (1/28). - RESECTION MARGINS, NEGATIVE FOR ATYPIA OR MALIGNANCY - RESECTION MARGINS, NEGATIVE FOR ATYPIA OR MALIGNANCY. 2. Colon, resection margin (donut), colon final distal margin - BENIGN COLONIC MUCOSA, NO EVIDENCE OF MALIGNANCY. Microscopic Comment 1. COLON Specimen: Sigmoid colon Procedure: Segmental resection Tumor site: Sigmoid colon Specimen integrity: Intact Macroscopic intactness of mesorectum: N/A Macroscopic tumor perforation: No Invasive tumor: Maximum size: 2.7 cm, gross measurement Histologic type(s): Invasive adenocarcinoma Histologic grade and differentiation: G2: moderately differentiated/low grade Type of polyp  tumor arose from: Tubular adenoma Microscopic extension of invasive tumor: Invading through the muscularis propria into pericolonic fatty tissue. Lymph-Vascular invasion: Not identified Peri-neural invasion: Not identified Tumor deposit(s) (discontinuous extramural extension): N/A Resection margins: Negative Open margin: 7.1 cm Stapled margin: 5.5 cm Mesenteric margin (sigmoid and transverse): 9 cm Treatment effect (neo-adjuvant therapy): No Additional polyp(s): N/A Non-neoplastic findings: N/A Lymph nodes: number examined 28; number positive: 1 Pathologic Staging: pT3, pN1a, pMX Ancillary studies: MMR stains will be performed and an addendum report will follow. MSI testing will be performed at an outside institution and the results will be available in EPIC. (HCL:kh 11-15-14)  TUMOR MSI: stable (by PCR),  MMR: NORMAL   RADIOGRAPHIC STUDIES: I have personally reviewed the radiological images as listed and agreed with the findings in the report.  CT chest 10/25/2014 IMPRESSION: No CT findings for pulmonary metastatic disease. No mediastinal or hilar mass or adenopathy.  CT abdomen Pelvis w contrast 10/10/2014 Eccentric wall thickening along the sigmoid colon, likely corresponding to the finding on colonoscopy, worrisome for early colonic adenocarcinoma.  No findings suspicious for metastatic disease.  ASSESSMENT & PLAN:  54 year old African-American female, without significant past medical history, presents with abdominal pain and bloody bowel movement, anemia, was found to have a sigmoid adenocarcinoma, status post complete surgical resection.   1. Sigmoid colon adenocarcinoma, G2, pT3pN1aM0, stage IIIB, MSI stable  -She is on adjuvant FOLFOX for total of 12 cycles planned. The goal of treatment is curative.  -She tolerated the first 4 cycle well with some expected side effects, but fatigued, and anorexia seems getting worse with each cycle. -Due to her thrombocytopenia  and fatigue, chemo dose was reduced by 10% from cycle 5  -She has developed G1 mild neuropathy, slightly worse, will add neurontin, possible further dose reduction or hold oxaliplatin if it gets worse. l watch it closely. -Her counts are adequate, proceed to cycle 6 today with 10% dose reduction (same as cycle 5)   2. body pain, secondary to chemo? -She states she has diffuse body pain after chemotherapy, she takes Tylenol and ibuprofen but not sufficient. -I give her her refill of oxycodone today.  3. Microcytic anemia, likely iron deficient anemia secondary to colon cancer, and chemotherapy related anemia -Her serum iron and saturation were low. Likely from her colon cancer relates.  -Her anemia improved after IV Feraheme.  -follow ferritin level.  4. Grade 1 neuropathy -Secondary to chemotherapy -I give her a prescription of Neurontin today  Plan -cycle 6 chemotherapy today -She will seethis in her in 2 weeks and the knee in 4 weeks before chemotherapy  -Start Neurontin 100 mg at bedtime, and increased  to 200 mg at bedtime after all week, for peripheral neuropathy  All questions were answered. The patient knows to call the clinic with any problems,  questions or concerns.  I spent 20 minutes counseling the patient face to face. The total time spent in the appointment was 25 minutes and more than 50% was on counseling.     Truitt Merle, MD 03/15/2015 12:48 PM

## 2015-03-15 NOTE — Patient Instructions (Signed)
[facility] Discharge Instructions for Patients Receiving Chemotherapy  Today you received the following chemotherapy agents 5 FU/Leucovorin/Oxaliplatin To help prevent nausea and vomiting after your treatment, we encourage you to take your nausea medication as prescribed.   If you develop nausea and vomiting that is not controlled by your nausea medication, call the clinic.   BELOW ARE SYMPTOMS THAT SHOULD BE REPORTED IMMEDIATELY:  *FEVER GREATER THAN 100.5 F  *CHILLS WITH OR WITHOUT FEVER  NAUSEA AND VOMITING THAT IS NOT CONTROLLED WITH YOUR NAUSEA MEDICATION  *UNUSUAL SHORTNESS OF BREATH  *UNUSUAL BRUISING OR BLEEDING  TENDERNESS IN MOUTH AND THROAT WITH OR WITHOUT PRESENCE OF ULCERS  *URINARY PROBLEMS  *BOWEL PROBLEMS  UNUSUAL RASH Items with * indicate a potential emergency and should be followed up as soon as possible.  Feel free to call the clinic you have any questions or concerns. The clinic phone number is [phone].  Please show the CHEMO ALERT CARD at check-in to the Emergency Department and triage nurse.   

## 2015-03-15 NOTE — Telephone Encounter (Signed)
Per staff message and POF I have scheduled appts. Advised scheduler of appts. JMW  

## 2015-03-15 NOTE — Telephone Encounter (Signed)
Gave avs & calendar for April. Sent message to schedule treatment. °

## 2015-03-16 ENCOUNTER — Other Ambulatory Visit: Payer: Self-pay | Admitting: *Deleted

## 2015-03-16 MED ORDER — GABAPENTIN 100 MG PO CAPS: 100.0000 mg | ORAL_CAPSULE | Freq: Three times a day (TID) | ORAL | Status: DC

## 2015-03-17 ENCOUNTER — Ambulatory Visit (HOSPITAL_BASED_OUTPATIENT_CLINIC_OR_DEPARTMENT_OTHER): Payer: BC Managed Care – PPO

## 2015-03-17 ENCOUNTER — Ambulatory Visit: Payer: BC Managed Care – PPO

## 2015-03-17 DIAGNOSIS — C189 Malignant neoplasm of colon, unspecified: Secondary | ICD-10-CM | POA: Diagnosis not present

## 2015-03-17 DIAGNOSIS — Z452 Encounter for adjustment and management of vascular access device: Secondary | ICD-10-CM

## 2015-03-17 MED ORDER — HEPARIN SOD (PORK) LOCK FLUSH 100 UNIT/ML IV SOLN
500.0000 [IU] | Freq: Once | INTRAVENOUS | Status: AC | PRN
Start: 2015-03-17 — End: 2015-03-17
  Administered 2015-03-17: 500 [IU]
  Filled 2015-03-17: qty 5

## 2015-03-17 MED ORDER — SODIUM CHLORIDE 0.9 % IJ SOLN
10.0000 mL | INTRAMUSCULAR | Status: DC | PRN
  Administered 2015-03-17: 10 mL
  Filled 2015-03-17: qty 10

## 2015-03-17 NOTE — Patient Instructions (Signed)
Fluorouracil, 5FU; Diclofenac topical cream What is this medicine? FLUOROURACIL; DICLOFENAC (flure oh YOOR a sil; dye KLOE fen ak) is a combination of a topical chemotherapy agent and non-steroidal anti-inflammatory drug (NSAID). It is used on the skin to treat skin cancer and skin conditions that could become cancer. This medicine may be used for other purposes; ask your health care provider or pharmacist if you have questions. COMMON BRAND NAME(S): FLUORAC What should I tell my health care provider before I take this medicine? They need to know if you have any of these conditions: -bleeding problems -cigarette smoker -DPD enzyme deficiency -heart disease -high blood pressure -if you frequently drink alcohol containing drinks -kidney disease -liver disease -open or infected skin -stomach problems -swelling or open sores at the treatment site -recent or planned coronary artery bypass graft (CABG) surgery -an unusual or allergic reaction to fluorouracil, diclofenac, aspirin, other NSAIDs, other medicines, foods, dyes, or preservatives -pregnant or trying to get pregnant -breast-feeding How should I use this medicine? This medicine is only for use on the skin. Follow the directions on the prescription label. Wash hands before and after use. Wash affected area and gently pat dry. To apply this medicine use a cotton-tipped applicator, or use gloves if applying with fingertips. If applied with unprotected fingertips, it is very important to wash your hands well after you apply this medicine. Avoid applying to the eyes, nose, or mouth. Apply enough medicine to cover the affected area. You can cover the area with a light gauze dressing, but do not use tight or air-tight dressings. Finish the full course prescribed by your doctor or health care professional, even if you think your condition is better. Do not stop taking except on the advice of your doctor or health care professional. Talk to your  pediatrician regarding the use of this medicine in children. Special care may be needed. Overdosage: If you think you've taken too much of this medicine contact a poison control center or emergency room at once. Overdosage: If you think you have taken too much of this medicine contact a poison control center or emergency room at once. NOTE: This medicine is only for you. Do not share this medicine with others. What if I miss a dose? If you miss a dose, apply it as soon as you can. If it is almost time for your next dose, only use that dose. Do not apply extra doses. Contact your doctor or health care professional if you miss more than one dose. What may interact with this medicine? Interactions are not expected. Do not use any other skin products without telling your doctor or health care professional. This list may not describe all possible interactions. Give your health care provider a list of all the medicines, herbs, non-prescription drugs, or dietary supplements you use. Also tell them if you smoke, drink alcohol, or use illegal drugs. Some items may interact with your medicine. What should I watch for while using this medicine? Visit your doctor or health care professional for checks on your progress. You will need to use this medicine for 2 to 6 weeks. This may be longer depending on the condition being treated. You may not see full healing for another 1 to 2 months after you stop using the medicine. Treated areas of skin can look unsightly during and for several weeks after treatment with this medicine. This medicine can make you more sensitive to the sun. Keep out of the sun. If you cannot avoid being in   the sun, wear protective clothing and use sunscreen. Do not use sun lamps or tanning beds/booths. Where should I keep my What side effects may I notice from receiving this medicine? Side effects that you should report to your doctor or health care professional as soon as possible: -allergic  reactions like skin rash, itching or hives, swelling of the face, lips, or tongue -black or bloody stools, blood in the urine or vomit -blurred vision -chest pain -difficulty breathing or wheezing -redness, blistering, peeling or loosening of the skin, including inside the mouth -severe redness and swelling of normal skin -slurred speech or weakness on one side of the body -trouble passing urine or change in the amount of urine -unexplained weight gain or swelling -unusually weak or tired -yellowing of eyes or skin Side effects that usually do not require medical attention (Report these to your doctor or health care professional if they continue or are bothersome.): -increased sensitivity of the skin to sun and ultraviolet light -pain and burning of the affected area -scaling or swelling of the affected area -skin rash, itching of the affected area -tenderness This list may not describe all possible side effects. Call your doctor for medical advice about side effects. You may report side effects to FDA at 1-800-FDA-1088. Where should I keep my medicine? Keep out of the reach of children. Store at room temperature between 20 and 25 degrees C (68 and 77 degrees F). Throw away any unused medicine after the expiration date. NOTE: This sheet is a summary. It may not cover all possible information. If you have questions about this medicine, talk to your doctor, pharmacist, or health care provider.  2015, [employer]vier/Gold Standard. (2014-04-11 11:09:58)  

## 2015-03-28 ENCOUNTER — Telehealth: Payer: Self-pay | Admitting: Hematology

## 2015-03-28 ENCOUNTER — Other Ambulatory Visit (HOSPITAL_BASED_OUTPATIENT_CLINIC_OR_DEPARTMENT_OTHER): Payer: BC Managed Care – PPO

## 2015-03-28 ENCOUNTER — Encounter: Payer: Self-pay | Admitting: Physician Assistant

## 2015-03-28 ENCOUNTER — Ambulatory Visit (HOSPITAL_BASED_OUTPATIENT_CLINIC_OR_DEPARTMENT_OTHER): Payer: BC Managed Care – PPO | Admitting: Physician Assistant

## 2015-03-28 VITALS — BP 134/82 | HR 65 | Temp 98.1°F | Resp 18 | Ht 63.0 in | Wt 235.4 lb

## 2015-03-28 DIAGNOSIS — D6481 Anemia due to antineoplastic chemotherapy: Secondary | ICD-10-CM | POA: Diagnosis not present

## 2015-03-28 DIAGNOSIS — C189 Malignant neoplasm of colon, unspecified: Secondary | ICD-10-CM

## 2015-03-28 DIAGNOSIS — C187 Malignant neoplasm of sigmoid colon: Secondary | ICD-10-CM

## 2015-03-28 DIAGNOSIS — G893 Neoplasm related pain (acute) (chronic): Secondary | ICD-10-CM | POA: Diagnosis not present

## 2015-03-28 DIAGNOSIS — D696 Thrombocytopenia, unspecified: Secondary | ICD-10-CM

## 2015-03-28 DIAGNOSIS — G620 Drug-induced polyneuropathy: Secondary | ICD-10-CM

## 2015-03-28 LAB — CBC & DIFF AND RETIC
BASO%: 0.2 % (ref 0.0–2.0)
Basophils Absolute: 0 10*3/uL (ref 0.0–0.1)
EOS%: 3 % (ref 0.0–7.0)
Eosinophils Absolute: 0.1 10*3/uL (ref 0.0–0.5)
HCT: 35.8 % (ref 34.8–46.6)
HGB: 11.6 g/dL (ref 11.6–15.9)
Immature Retic Fract: 22.3 % — ABNORMAL HIGH (ref 1.60–10.00)
LYMPH%: 52.6 % — ABNORMAL HIGH (ref 14.0–49.7)
MCH: 26.2 pg (ref 25.1–34.0)
MCHC: 32.4 g/dL (ref 31.5–36.0)
MCV: 81 fL (ref 79.5–101.0)
MONO#: 0.4 10*3/uL (ref 0.1–0.9)
MONO%: 8.9 % (ref 0.0–14.0)
NEUT#: 1.4 10*3/uL — ABNORMAL LOW (ref 1.5–6.5)
NEUT%: 35.3 % — ABNORMAL LOW (ref 38.4–76.8)
Platelets: 76 10*3/uL — ABNORMAL LOW (ref 145–400)
RBC: 4.42 10*6/uL (ref 3.70–5.45)
RDW: 23.1 % — ABNORMAL HIGH (ref 11.2–14.5)
Retic %: 2.38 % — ABNORMAL HIGH (ref 0.70–2.10)
Retic Ct Abs: 105.2 10*3/uL — ABNORMAL HIGH (ref 33.70–90.70)
WBC: 4.1 10*3/uL (ref 3.9–10.3)
lymph#: 2.1 10*3/uL (ref 0.9–3.3)
nRBC: 0 % (ref 0–0)

## 2015-03-28 LAB — COMPREHENSIVE METABOLIC PANEL (CC13)
ALT: 25 U/L (ref 0–55)
AST: 28 U/L (ref 5–34)
Albumin: 3.7 g/dL (ref 3.5–5.0)
Alkaline Phosphatase: 84 U/L (ref 40–150)
Anion Gap: 10 mEq/L (ref 3–11)
BUN: 16.4 mg/dL (ref 7.0–26.0)
CO2: 22 mEq/L (ref 22–29)
Calcium: 9.3 mg/dL (ref 8.4–10.4)
Chloride: 109 mEq/L (ref 98–109)
Creatinine: 0.8 mg/dL (ref 0.6–1.1)
EGFR: >90 mL/min/{1.73_m2} (ref >90)
Glucose: 107 mg/dl (ref 70–140)
Potassium: 3.9 mEq/L (ref 3.5–5.1)
Sodium: 141 mEq/L (ref 136–145)
Total Bilirubin: 0.45 mg/dL (ref 0.20–1.20)
Total Protein: 7.8 g/dL (ref 6.4–8.3)

## 2015-03-28 NOTE — Telephone Encounter (Signed)
Appointments adjusted/rescheduled per 4/5 pof. Patient given avs report and appointments for April.

## 2015-03-28 NOTE — Progress Notes (Signed)
[facility]  [phone] Fax:([phone]) [place] Note   Patient Care Team: Lind Covert, MD as PCP - General Juanita Craver, MD as Consulting Physician (Gastroenterology) [name], MD as Consulting Physician (General Surgery) Truitt Merle, MD as Consulting Physician (Hematology) 03/28/2015  CHIEF COMPLAINTS Follow up colon cancer   DIAGNOSIS:  Colon cancer   Staging form: Colon and Rectum, AJCC 7th Edition     Clinical: Stage IIIB (T3, N1a, M0) - Signed by Truitt Merle, MD on 12/28/2014     Colon cancer   10/10/2014 Imaging CT abdomen and pelvis: Eccentric wall thickening along the sigmoid colon, worrisome for early colonic adenocarcinoma. No findings suspicious for metastatic disease. CT chest (-)      11/14/2014 Initial Diagnosis Colon cancer   11/14/2014 Pathologic Stage pT3pN1pMx, G2, tumor invading through the muscular propria into pericolonic fatty tissue.  LVI (-), PNI (-). 1/28 node positive, margins negative.    11/14/2014 Surgery sigmoid colon segmental resection, margins (-).     HISTORY OF INITIAL PRESENTING ILLNESS:  Jenny [name] 54 y.o. female without a significant past medical history, who is referred by her surgeon Dr. Marcello Moores to discuss adjuvant chemotherapy for her resected colon cancer.  She presented with abdominal pain for 8-9 month, which was related to position. She noticed bloody stool in Aug 2015. She was seen by PCP, and was referred to GI Dr. Collene Mares, and underwent colonoscopy which showed a mass at sigmoid colon (per patient, I do not have the report). Biopsy showed adenocarcinoma. She was then referred to surgeon Dr. Marcello Moores and underwent sigmoid colon segmentectomy on 11/14/2014.   TREATMENT: [id number]  started on 12/28/2014, 10% dose reduction due to fatigue and thrombocytopenia from cycle 5. Status post 6 cycles  INTERIM HISTORY: Jenny [name] returns for follow-up and 7 th cycle chemo. She Continues to complain  of fatigue but is able to do her ADLs. She complains of her legs aching. She is tolerating the Neurontin and is currently is taking 200 mg at bedtime. She denied any fever or chills, nausea or vomiting, diarrhea or constipation.   MEDICAL HISTORY:  Past Medical History  Diagnosis Date  . Enlarged thyroid   . Anemia   . Cancer     colon cancer     SURGICAL HISTORY: Past Surgical History  Procedure Laterality Date  . Tonsillectomy    . Cervical spine surgery  06/17/2012    C5-C7 ACDF  . Cesarean section    . Partial hysterectomy    . Portacath placement Left 12/22/2014    Procedure: INSERTION PORT-A-CATH LEFT SUBCLAVIAN;  Surgeon: [name], MD;  Location: [facility];  Service: General;  Laterality: Left;    SOCIAL HISTORY: History   Social History  . Marital Status: Married    Spouse Name: N/A    Number of Children: 2  . Years of Education: N/A   Occupational History  . Not on file.   Social History Main Topics  . Smoking status: Never Smoker   . Smokeless tobacco: Never Used  . Alcohol Use: No  . Drug Use: No  . Sexual Activity: Not on file    P2G2, one child was murdered. She is a Paramedic.   FAMILY HISTORY: Family History  Problem Relation Age of Onset  . Colon cancer Neg Hx   . Liver Cancer Brother        . Hypertension Mother     ALLERGIES:  is allergic to other  and shellfish allergy.  MEDICATIONS:  Current Outpatient Prescriptions  Medication Sig Dispense Refill  . diphenhydrAMINE (BENADRYL) 12.5 MG/5ML liquid Take 12.5-25 mg by mouth every 4 (four) hours as needed for itching or allergies.    Marland Kitchen gabapentin (NEURONTIN) 100 MG capsule Take 1 capsule (100 mg total) by mouth 3 (three) times daily. 90 capsule 1  . Hydrocodone-Acetaminophen (VICODIN) 5-300 MG TABS Take 1 tablet by mouth every 21 ( twenty-one) days. 5 each 0  . hydrocortisone cream 0.5 % Apply 1 application topically 2 (two) times daily as needed for itching.    .  lidocaine-prilocaine (EMLA) cream Apply 1 application topically as needed. 30 g 2  . ondansetron (ZOFRAN) 8 MG tablet Take 1 by mouth twice a day for 2 days after chemotherapy and then twice daily as needed 30 tablet 1  . oxyCODONE (OXY IR/ROXICODONE) 5 MG immediate release tablet Take 1 tablet (5 mg total) by mouth every 6 (six) hours as needed for severe pain. 60 tablet 0  . triamcinolone (KENALOG) 0.025 % ointment Apply 1 application topically 2 (two) times daily. 30 g 1   No current facility-administered medications for this visit.    REVIEW OF SYSTEMS:   Constitutional: Denies fevers, chills or abnormal night sweats Eyes: Denies blurriness of vision, double vision or watery eyes Ears, nose, mouth, throat, and face: Denies mucositis or sore throat Respiratory: Denies cough, dyspnea or wheezes Cardiovascular: Denies palpitation, chest discomfort or lower extremity swelling Gastrointestinal:  Denies nausea, heartburn or change in bowel habits Skin: Denies abnormal skin rashes Lymphatics: Denies new lymphadenopathy or easy bruising Neurological:Denies numbness, tingling or new weaknesses Behavioral/Psych: Mood is stable, no new changes  All other systems were reviewed with the patient and are negative.  PHYSICAL EXAMINATION: ECOG PERFORMANCE STATUS: 2 Vital sign was reviewed, normal.  GENERAL:alert, no distress and comfortable SKIN: skin color, texture, turgor are normal, no rashes or significant lesions EYES: normal, conjunctiva are pink and non-injected, sclera clear OROPHARYNX:no exudate, no erythema and lips, buccal mucosa, and tongue normal  NECK: supple, thyroid normal size, non-tender, without nodularity LYMPH:  no palpable lymphadenopathy in the cervical, axillary or inguinal LUNGS: clear to auscultation and percussion with normal breathing effort HEART: regular rate & rhythm and no murmurs and no lower extremity edema ABDOMEN:abdomen soft, non-tender and normal bowel sounds.  Surgical wound has well-healed Musculoskeletal:no cyanosis of digits and no clubbing  PSYCH: alert & oriented x 3 with fluent speech NEURO: no focal motor/sensory deficits  LABORATORY DATA:  I have reviewed the data as listed CBC Latest Ref Rng 03/28/2015 03/15/2015 03/01/2015  WBC 3.9 - 10.3 10e3/uL 4.1 4.8 4.9  Hemoglobin 11.6 - 15.9 g/dL 11.6 12.7 13.0  Hematocrit 34.8 - 46.6 % 35.8 38.9 40.6  Platelets 145 - 400 10e3/uL 76(L) 166 179    CMP Latest Ref Rng 03/28/2015 03/15/2015 03/01/2015  Glucose 70 - 140 mg/dl 107 118 124  BUN 7.0 - 26.0 mg/dL 16.4 12.7 12.4  Creatinine 0.6 - 1.1 mg/dL 0.8 0.7 0.8  Sodium 136 - 145 mEq/L 141 142 144  Potassium 3.5 - 5.1 mEq/L 3.9 4.1 3.8  Chloride 96 - 112 mEq/L - - -  CO2 22 - 29 mEq/L _0 Calcium 8.4 - 10.4 mg/dL 9.3 9.3 9.7  Total Protein 6.4 - 8.3 g/dL 7.8 7.6 8.0  Total Bilirubin 0.20 - 1.20 mg/dL 0.45 0.36 0.35  Alkaline Phos 40 - 150 U/L 84 70 81  AST 5 - 34 U/L 28 27  30  ALT 0 - 55 U/L 25 26 33     PATHOLOGY REPORT 11/14/2014 Diagnosis(continued) 1. Colon, segmental resection for tumor, sigmoid - INVASIVE ADENOCARCINOMA, INVADING THROUGH THE MUSCULARIS PROPRIA INTO PERICOLONIC FATTY TISSUE. - ONE OF TWENTY-EIGHT LYMPH NODES, POSITIVE FOR METASTATIC CARCINOMA (1/28). - RESECTION MARGINS, NEGATIVE FOR ATYPIA OR MALIGNANCY - RESECTION MARGINS, NEGATIVE FOR ATYPIA OR MALIGNANCY. 2. Colon, resection margin (donut), colon final distal margin - BENIGN COLONIC MUCOSA, NO EVIDENCE OF MALIGNANCY. Microscopic Comment 1. COLON Specimen: Sigmoid colon Procedure: Segmental resection Tumor site: Sigmoid colon Specimen integrity: Intact Macroscopic intactness of mesorectum: N/A Macroscopic tumor perforation: No Invasive tumor: Maximum size: 2.7 cm, gross measurement Histologic type(s): Invasive adenocarcinoma Histologic grade and differentiation: G2: moderately differentiated/low grade Type of polyp tumor arose from: Tubular  adenoma Microscopic extension of invasive tumor: Invading through the muscularis propria into pericolonic fatty tissue. Lymph-Vascular invasion: Not identified Peri-neural invasion: Not identified Tumor deposit(s) (discontinuous extramural extension): N/A Resection margins: Negative Open margin: 7.1 cm Stapled margin: 5.5 cm Mesenteric margin (sigmoid and transverse): 9 cm Treatment effect (neo-adjuvant therapy): No Additional polyp(s): N/A Non-neoplastic findings: N/A Lymph nodes: number examined 28; number positive: 1 Pathologic Staging: pT3, pN1a, pMX Ancillary studies: MMR stains will be performed and an addendum report will follow. MSI testing will be performed at an outside institution and the results will be available in EPIC. (HCL:kh 11-15-14)  TUMOR MSI: stable (by PCR),  MMR: NORMAL   RADIOGRAPHIC STUDIES: I have personally reviewed the radiological images as listed and agreed with the findings in the report.  CT chest 10/25/2014 IMPRESSION: No CT findings for pulmonary metastatic disease. No mediastinal or hilar mass or adenopathy.  CT abdomen Pelvis w contrast 10/10/2014 Eccentric wall thickening along the sigmoid colon, likely corresponding to the finding on colonoscopy, worrisome for early colonic adenocarcinoma.  No findings suspicious for metastatic disease.  ASSESSMENT & PLAN:  54 year old African-American female, without significant past medical history, presents with abdominal pain and bloody bowel movement, anemia, was found to have a sigmoid adenocarcinoma, status post complete surgical resection.   1. Sigmoid colon adenocarcinoma, G2, pT3pN1aM0, stage IIIB, MSI stable  -She is on adjuvant FOLFOX for total of 12 cycles planned. The goal of treatment is curative.  -She tolerated the first 4 cycle well with some expected side effects, but fatigued, and anorexia seems getting worse with each cycle. -Due to her thrombocytopenia and fatigue, chemo dose was  reduced by 10% from cycle 5  -She has developed G1 mild neuropathy, slightly worse, will add neurontin, possible further dose reduction or hold oxaliplatin if it gets worse. l watch it closely. -Her platelet count is low today and we will post pone chemotherapy (cycle 7)  by one week.with 10% dose reduction (same as cycle 5)   2. body pain, secondary to chemo? -She states she has diffuse body pain after chemotherapy, she will continue oxycodone as prescribed.   3. Microcytic anemia, likely iron deficient anemia secondary to colon cancer, and chemotherapy related anemia -Her serum iron and saturation were low. Likely from her colon cancer relates.  -Her anemia improved after IV Feraheme.  - continue tofollow ferritin level.  4. Grade 1 neuropathy -Secondary to chemotherapy -She will increase her Neurontin to  300 mg at bedtime  Plan -postpone cycle 7 chemotherapy today, secondary to thrombocytopenia -She return in one week for cycle # 7 if her platelet count has recovered. She will then follow up with Dr. Burr Medico in 3 weeks with the start of  cycle # 8.  -Increase Neurontin to 300 mg at bedtime for peripheral neuropathy  All questions were answered. The patient knows to call the clinic with any problems,  questions or concerns.  I spent 25 minutes counseling the patient face to face. The total time spent in the appointment was 30 minutes and more than 50% was on counseling.  Patient reviewed with Dr. Burr Medico.    Carlton Adam, PA-C 03/28/2015 4:37 PM

## 2015-03-29 ENCOUNTER — Encounter: Payer: Self-pay | Admitting: Hematology

## 2015-03-29 ENCOUNTER — Ambulatory Visit: Payer: BC Managed Care – PPO

## 2015-03-29 NOTE — Progress Notes (Unsigned)
03/29/2015 Disability forms completed and signed by Dr. Burr Medico.  I faxed the completed forms to [facility] [employer]. Stanford Breed

## 2015-04-01 NOTE — Patient Instructions (Signed)
Increase your Neurontin to 300 mg by mouth at bedtime Return in 1 week for chemotherapy Follow up in 3 weeks for another evaluation prior to the next scheduled cycle of chemotherapy

## 2015-04-05 ENCOUNTER — Other Ambulatory Visit: Payer: Self-pay | Admitting: Hematology

## 2015-04-05 ENCOUNTER — Other Ambulatory Visit (HOSPITAL_BASED_OUTPATIENT_CLINIC_OR_DEPARTMENT_OTHER): Payer: BC Managed Care – PPO

## 2015-04-05 ENCOUNTER — Ambulatory Visit (HOSPITAL_BASED_OUTPATIENT_CLINIC_OR_DEPARTMENT_OTHER): Payer: BC Managed Care – PPO

## 2015-04-05 VITALS — BP 129/69 | HR 63 | Temp 98.1°F

## 2015-04-05 DIAGNOSIS — C187 Malignant neoplasm of sigmoid colon: Secondary | ICD-10-CM

## 2015-04-05 DIAGNOSIS — C189 Malignant neoplasm of colon, unspecified: Secondary | ICD-10-CM

## 2015-04-05 DIAGNOSIS — Z5111 Encounter for antineoplastic chemotherapy: Secondary | ICD-10-CM | POA: Diagnosis not present

## 2015-04-05 LAB — CBC & DIFF AND RETIC
BASO%: 0.6 % (ref 0.0–2.0)
Basophils Absolute: 0 10*3/uL (ref 0.0–0.1)
EOS%: 2.2 % (ref 0.0–7.0)
Eosinophils Absolute: 0.1 10*3/uL (ref 0.0–0.5)
HCT: 38.7 % (ref 34.8–46.6)
HGB: 12.4 g/dL (ref 11.6–15.9)
Immature Retic Fract: 19.9 % — ABNORMAL HIGH (ref 1.60–10.00)
LYMPH%: 48.9 % (ref 14.0–49.7)
MCH: 26.4 pg (ref 25.1–34.0)
MCHC: 32 g/dL (ref 31.5–36.0)
MCV: 82.5 fL (ref 79.5–101.0)
MONO#: 0.4 10*3/uL (ref 0.1–0.9)
MONO%: 8.7 % (ref 0.0–14.0)
NEUT#: 1.8 10*3/uL (ref 1.5–6.5)
NEUT%: 39.6 % (ref 38.4–76.8)
Platelets: 132 10*3/uL — ABNORMAL LOW (ref 145–400)
RBC: 4.69 10*6/uL (ref 3.70–5.45)
RDW: 22.8 % — ABNORMAL HIGH (ref 11.2–14.5)
Retic %: 1.94 % (ref 0.70–2.10)
Retic Ct Abs: 90.99 10*3/uL — ABNORMAL HIGH (ref 33.70–90.70)
WBC: 4.6 10*3/uL (ref 3.9–10.3)
lymph#: 2.3 10*3/uL (ref 0.9–3.3)

## 2015-04-05 LAB — COMPREHENSIVE METABOLIC PANEL (CC13)
ALT: 32 U/L (ref 0–55)
AST: 32 U/L (ref 5–34)
Albumin: 3.8 g/dL (ref 3.5–5.0)
Alkaline Phosphatase: 77 U/L (ref 40–150)
Anion Gap: 11 mEq/L (ref 3–11)
BUN: 11.4 mg/dL (ref 7.0–26.0)
CO2: 25 mEq/L (ref 22–29)
Calcium: 9.4 mg/dL (ref 8.4–10.4)
Chloride: 105 mEq/L (ref 98–109)
Creatinine: 0.8 mg/dL (ref 0.6–1.1)
EGFR: >90 mL/min/{1.73_m2} (ref >90)
Glucose: 152 mg/dl — ABNORMAL HIGH (ref 70–140)
Potassium: 3.8 mEq/L (ref 3.5–5.1)
Sodium: 141 mEq/L (ref 136–145)
Total Bilirubin: 0.48 mg/dL (ref 0.20–1.20)
Total Protein: 7.7 g/dL (ref 6.4–8.3)

## 2015-04-05 MED ORDER — DEXTROSE 5 % IV SOLN
Freq: Once | INTRAVENOUS | Status: AC
  Administered 2015-04-05: 12:00:00 via INTRAVENOUS

## 2015-04-05 MED ORDER — SODIUM CHLORIDE 0.9 % IV SOLN
Freq: Once | INTRAVENOUS | Status: AC
  Administered 2015-04-05: 13:00:00 via INTRAVENOUS
  Filled 2015-04-05: qty 4

## 2015-04-05 MED ORDER — FLUOROURACIL CHEMO INJECTION 2.5 GM/50ML: 360.0000 mg/m2 | Freq: Once | INTRAVENOUS | Status: DC

## 2015-04-05 MED ORDER — DEXTROSE 5 % IV SOLN
360.0000 mg/m2 | Freq: Once | INTRAVENOUS | Status: AC
  Administered 2015-04-05: 800 mg via INTRAVENOUS
  Filled 2015-04-05: qty 40

## 2015-04-05 MED ORDER — OXALIPLATIN CHEMO INJECTION 100 MG/20ML
76.5000 mg/m2 | Freq: Once | INTRAVENOUS | Status: AC
  Administered 2015-04-05: 170 mg via INTRAVENOUS
  Filled 2015-04-05: qty 34

## 2015-04-05 MED ORDER — FLUOROURACIL CHEMO INJECTION 5 GM/100ML
2160.0000 mg/m2 | INTRAVENOUS | Status: DC
  Administered 2015-04-05: 4800 mg via INTRAVENOUS
  Filled 2015-04-05: qty 96

## 2015-04-05 NOTE — Patient Instructions (Signed)
[facility] Discharge Instructions for Patients Receiving Chemotherapy  Today you received the following chemotherapy agents 5 FU/Leucovorin/Oxaliplatin To help prevent nausea and vomiting after your treatment, we encourage you to take your nausea medication as prescribed.   If you develop nausea and vomiting that is not controlled by your nausea medication, call the clinic.   BELOW ARE SYMPTOMS THAT SHOULD BE REPORTED IMMEDIATELY:  *FEVER GREATER THAN 100.5 F  *CHILLS WITH OR WITHOUT FEVER  NAUSEA AND VOMITING THAT IS NOT CONTROLLED WITH YOUR NAUSEA MEDICATION  *UNUSUAL SHORTNESS OF BREATH  *UNUSUAL BRUISING OR BLEEDING  TENDERNESS IN MOUTH AND THROAT WITH OR WITHOUT PRESENCE OF ULCERS  *URINARY PROBLEMS  *BOWEL PROBLEMS  UNUSUAL RASH Items with * indicate a potential emergency and should be followed up as soon as possible.  Feel free to call the clinic you have any questions or concerns. The clinic phone number is [phone].  Please show the CHEMO ALERT CARD at check-in to the Emergency Department and triage nurse.   

## 2015-04-05 NOTE — Progress Notes (Signed)
Discussed with patient that Dr. Burr Medico is not going to give her the 21fu push today because her blood counts have been borderline low.  She will get the 48fu in the pump for 46hrs.  When she returns to have pump discontinued, she will also get the neulasta shot that she has had before to help her white blood cell count.  Patient is agreeable to this plan

## 2015-04-07 ENCOUNTER — Ambulatory Visit (HOSPITAL_BASED_OUTPATIENT_CLINIC_OR_DEPARTMENT_OTHER): Payer: BC Managed Care – PPO

## 2015-04-07 ENCOUNTER — Ambulatory Visit: Payer: BC Managed Care – PPO

## 2015-04-07 VITALS — BP 106/62 | HR 73 | Temp 98.6°F

## 2015-04-07 DIAGNOSIS — C187 Malignant neoplasm of sigmoid colon: Secondary | ICD-10-CM

## 2015-04-07 DIAGNOSIS — Z5189 Encounter for other specified aftercare: Secondary | ICD-10-CM

## 2015-04-07 DIAGNOSIS — C189 Malignant neoplasm of colon, unspecified: Secondary | ICD-10-CM

## 2015-04-07 MED ORDER — SODIUM CHLORIDE 0.9 % IJ SOLN
10.0000 mL | INTRAMUSCULAR | Status: DC | PRN
  Administered 2015-04-07: 10 mL
  Filled 2015-04-07: qty 10

## 2015-04-07 MED ORDER — HEPARIN SOD (PORK) LOCK FLUSH 100 UNIT/ML IV SOLN
500.0000 [IU] | Freq: Once | INTRAVENOUS | Status: AC | PRN
  Administered 2015-04-07: 500 [IU]
  Filled 2015-04-07: qty 5

## 2015-04-07 MED ORDER — PEGFILGRASTIM INJECTION 6 MG/0.6ML ~~LOC~~
6.0000 mg | PREFILLED_SYRINGE | Freq: Once | SUBCUTANEOUS | Status: AC
  Administered 2015-04-07: 6 mg via SUBCUTANEOUS
  Filled 2015-04-07: qty 0.6

## 2015-04-07 NOTE — Progress Notes (Signed)
Neulasta injection given by the flush nurse.

## 2015-04-07 NOTE — Patient Instructions (Signed)
Pegfilgrastim injection What is this medicine? PEGFILGRASTIM (peg fil GRA stim) is a long-acting granulocyte colony-stimulating factor that stimulates the growth of neutrophils, a type of white blood cell important in the body's fight against infection. It is used to reduce the incidence of fever and infection in patients with certain types of cancer who are receiving chemotherapy that affects the bone marrow. This medicine may be used for other purposes; ask your health care provider or pharmacist if you have questions. COMMON BRAND NAME(S): Neulasta What should I tell my health care provider before I take this medicine? They need to know if you have any of these conditions: -latex allergy -ongoing radiation therapy -sickle cell disease -skin reactions to acrylic adhesives (On-Body Injector only) -an unusual or allergic reaction to pegfilgrastim, filgrastim, other medicines, foods, dyes, or preservatives -pregnant or trying to get pregnant -breast-feeding How should I use this medicine? This medicine is for injection under the skin. If you get this medicine at home, you will be taught how to prepare and give the pre-filled syringe or how to use the On-body Injector. Refer to the patient Instructions for Use for detailed instructions. Use exactly as directed. Take your medicine at regular intervals. Do not take your medicine more often than directed. It is important that you put your used needles and syringes in a special sharps container. Do not put them in a trash can. If you do not have a sharps container, call your pharmacist or healthcare provider to get one. Talk to your pediatrician regarding the use of this medicine in children. Special care may be needed. Overdosage: If you think you have taken too much of this medicine contact a poison control center or emergency room at once. NOTE: This medicine is only for you. Do not share this medicine with others. What if I miss a dose? It is  important not to miss your dose. Call your doctor or health care professional if you miss your dose. If you miss a dose due to an On-body Injector failure or leakage, a new dose should be administered as soon as possible using a single prefilled syringe for manual use. What may interact with this medicine? Interactions have not been studied. Give your health care provider a list of all the medicines, herbs, non-prescription drugs, or dietary supplements you use. Also tell them if you smoke, drink alcohol, or use illegal drugs. Some items may interact with your medicine. This list may not describe all possible interactions. Give your health care provider a list of all the medicines, herbs, non-prescription drugs, or dietary supplements you use. Also tell them if you smoke, drink alcohol, or use illegal drugs. Some items may interact with your medicine. What should I watch for while using this medicine? You may need blood work done while you are taking this medicine. If you are going to need a MRI, CT scan, or other procedure, tell your doctor that you are using this medicine (On-Body Injector only). What side effects may I notice from receiving this medicine? Side effects that you should report to your doctor or health care professional as soon as possible: -allergic reactions like skin rash, itching or hives, swelling of the face, lips, or tongue -dizziness -fever -pain, redness, or irritation at site where injected -pinpoint red spots on the skin -shortness of breath or breathing problems -stomach or side pain, or pain at the shoulder -swelling -tiredness -trouble passing urine Side effects that usually do not require medical attention (report to your doctor   or health care professional if they continue or are bothersome): -bone pain -muscle pain This list may not describe all possible side effects. Call your doctor for medical advice about side effects. You may report side effects to FDA at  1-800-FDA-1088. Where should I keep my medicine? Keep out of the reach of children. Store pre-filled syringes in a refrigerator between 2 and 8 degrees C (36 and 46 degrees F). Do not freeze. Keep in carton to protect from light. Throw away this medicine if it is left out of the refrigerator for more than 48 hours. Throw away any unused medicine after the expiration date. NOTE: This sheet is a summary. It may not cover all possible information. If you have questions about this medicine, talk to your doctor, pharmacist, or health care provider.  2015, Elsevier/Gold Standard. (2014-03-10 16:14:05) Fluorouracil, 5FU; Diclofenac topical cream What is this medicine? FLUOROURACIL; DICLOFENAC (flure oh YOOR a sil; dye KLOE fen ak) is a combination of a topical chemotherapy agent and non-steroidal anti-inflammatory drug (NSAID). It is used on the skin to treat skin cancer and skin conditions that could become cancer. This medicine may be used for other purposes; ask your health care provider or pharmacist if you have questions. COMMON BRAND NAME(S): FLUORAC What should I tell my health care provider before I take this medicine? They need to know if you have any of these conditions: -bleeding problems -cigarette smoker -DPD enzyme deficiency -heart disease -high blood pressure -if you frequently drink alcohol containing drinks -kidney disease -liver disease -open or infected skin -stomach problems -swelling or open sores at the treatment site -recent or planned coronary artery bypass graft (CABG) surgery -an unusual or allergic reaction to fluorouracil, diclofenac, aspirin, other NSAIDs, other medicines, foods, dyes, or preservatives -pregnant or trying to get pregnant -breast-feeding How should I use this medicine? This medicine is only for use on the skin. Follow the directions on the prescription label. Wash hands before and after use. Wash affected area and gently pat dry. To apply this  medicine use a cotton-tipped applicator, or use gloves if applying with fingertips. If applied with unprotected fingertips, it is very important to wash your hands well after you apply this medicine. Avoid applying to the eyes, nose, or mouth. Apply enough medicine to cover the affected area. You can cover the area with a light gauze dressing, but do not use tight or air-tight dressings. Finish the full course prescribed by your doctor or health care professional, even if you think your condition is better. Do not stop taking except on the advice of your doctor or health care professional. Talk to your pediatrician regarding the use of this medicine in children. Special care may be needed. Overdosage: If you think you've taken too much of this medicine contact a poison control center or emergency room at once. Overdosage: If you think you have taken too much of this medicine contact a poison control center or emergency room at once. NOTE: This medicine is only for you. Do not share this medicine with others. What if I miss a dose? If you miss a dose, apply it as soon as you can. If it is almost time for your next dose, only use that dose. Do not apply extra doses. Contact your doctor or health care professional if you miss more than one dose. What may interact with this medicine? Interactions are not expected. Do not use any other skin products without telling your doctor or health care professional. This list   may not describe all possible interactions. Give your health care provider a list of all the medicines, herbs, non-prescription drugs, or dietary supplements you use. Also tell them if you smoke, drink alcohol, or use illegal drugs. Some items may interact with your medicine. What should I watch for while using this medicine? Visit your doctor or health care professional for checks on your progress. You will need to use this medicine for 2 to 6 weeks. This may be longer depending on the condition  being treated. You may not see full healing for another 1 to 2 months after you stop using the medicine. Treated areas of skin can look unsightly during and for several weeks after treatment with this medicine. This medicine can make you more sensitive to the sun. Keep out of the sun. If you cannot avoid being in the sun, wear protective clothing and use sunscreen. Do not use sun lamps or tanning beds/booths. Where should I keep my What side effects may I notice from receiving this medicine? Side effects that you should report to your doctor or health care professional as soon as possible: -allergic reactions like skin rash, itching or hives, swelling of the face, lips, or tongue -black or bloody stools, blood in the urine or vomit -blurred vision -chest pain -difficulty breathing or wheezing -redness, blistering, peeling or loosening of the skin, including inside the mouth -severe redness and swelling of normal skin -slurred speech or weakness on one side of the body -trouble passing urine or change in the amount of urine -unexplained weight gain or swelling -unusually weak or tired -yellowing of eyes or skin Side effects that usually do not require medical attention (Report these to your doctor or health care professional if they continue or are bothersome.): -increased sensitivity of the skin to sun and ultraviolet light -pain and burning of the affected area -scaling or swelling of the affected area -skin rash, itching of the affected area -tenderness This list may not describe all possible side effects. Call your doctor for medical advice about side effects. You may report side effects to FDA at 1-800-FDA-1088. Where should I keep my medicine? Keep out of the reach of children. Store at room temperature between 20 and 25 degrees C (68 and 77 degrees F). Throw away any unused medicine after the expiration date. NOTE: This sheet is a summary. It may not cover all possible information.  If you have questions about this medicine, talk to your doctor, pharmacist, or health care provider.  2015, Elsevier/Gold Standard. (2014-04-11 11:09:58)  

## 2015-04-12 ENCOUNTER — Other Ambulatory Visit: Payer: BC Managed Care – PPO

## 2015-04-12 ENCOUNTER — Ambulatory Visit: Payer: BC Managed Care – PPO | Admitting: Hematology

## 2015-04-12 ENCOUNTER — Ambulatory Visit: Payer: BC Managed Care – PPO

## 2015-04-19 ENCOUNTER — Ambulatory Visit (HOSPITAL_BASED_OUTPATIENT_CLINIC_OR_DEPARTMENT_OTHER): Payer: BC Managed Care – PPO | Admitting: Nurse Practitioner

## 2015-04-19 ENCOUNTER — Telehealth: Payer: Self-pay | Admitting: Hematology

## 2015-04-19 ENCOUNTER — Ambulatory Visit (HOSPITAL_BASED_OUTPATIENT_CLINIC_OR_DEPARTMENT_OTHER): Payer: BC Managed Care – PPO

## 2015-04-19 ENCOUNTER — Other Ambulatory Visit (HOSPITAL_BASED_OUTPATIENT_CLINIC_OR_DEPARTMENT_OTHER): Payer: BC Managed Care – PPO

## 2015-04-19 ENCOUNTER — Ambulatory Visit (HOSPITAL_BASED_OUTPATIENT_CLINIC_OR_DEPARTMENT_OTHER): Payer: BC Managed Care – PPO | Admitting: Hematology

## 2015-04-19 VITALS — BP 150/94 | HR 64 | Temp 97.0°F | Resp 19

## 2015-04-19 VITALS — BP 122/84 | HR 70 | Temp 98.2°F | Resp 18 | Ht 63.0 in | Wt 249.7 lb

## 2015-04-19 DIAGNOSIS — G893 Neoplasm related pain (acute) (chronic): Secondary | ICD-10-CM

## 2015-04-19 DIAGNOSIS — C187 Malignant neoplasm of sigmoid colon: Secondary | ICD-10-CM

## 2015-04-19 DIAGNOSIS — R531 Weakness: Secondary | ICD-10-CM

## 2015-04-19 DIAGNOSIS — G620 Drug-induced polyneuropathy: Secondary | ICD-10-CM | POA: Diagnosis not present

## 2015-04-19 DIAGNOSIS — C189 Malignant neoplasm of colon, unspecified: Secondary | ICD-10-CM

## 2015-04-19 DIAGNOSIS — D630 Anemia in neoplastic disease: Secondary | ICD-10-CM

## 2015-04-19 DIAGNOSIS — Z5111 Encounter for antineoplastic chemotherapy: Secondary | ICD-10-CM

## 2015-04-19 DIAGNOSIS — D6481 Anemia due to antineoplastic chemotherapy: Secondary | ICD-10-CM | POA: Diagnosis not present

## 2015-04-19 LAB — COMPREHENSIVE METABOLIC PANEL (CC13)
ALT: 19 U/L (ref 0–55)
AST: 22 U/L (ref 5–34)
Albumin: 3.7 g/dL (ref 3.5–5.0)
Alkaline Phosphatase: 99 U/L (ref 40–150)
Anion Gap: 11 mEq/L (ref 3–11)
BUN: 12.8 mg/dL (ref 7.0–26.0)
CO2: 23 mEq/L (ref 22–29)
Calcium: 10 mg/dL (ref 8.4–10.4)
Chloride: 109 mEq/L (ref 98–109)
Creatinine: 0.8 mg/dL (ref 0.6–1.1)
EGFR: >90 mL/min/{1.73_m2} (ref >90)
Glucose: 125 mg/dl (ref 70–140)
Potassium: 4.2 mEq/L (ref 3.5–5.1)
Sodium: 142 mEq/L (ref 136–145)
Total Bilirubin: 0.34 mg/dL (ref 0.20–1.20)
Total Protein: 7.8 g/dL (ref 6.4–8.3)

## 2015-04-19 LAB — CBC & DIFF AND RETIC
BASO%: 0.4 % (ref 0.0–2.0)
Basophils Absolute: 0 10*3/uL (ref 0.0–0.1)
EOS%: 1.4 % (ref 0.0–7.0)
Eosinophils Absolute: 0.1 10*3/uL (ref 0.0–0.5)
HCT: 37 % (ref 34.8–46.6)
HGB: 12 g/dL (ref 11.6–15.9)
Immature Retic Fract: 21.4 % — ABNORMAL HIGH (ref 1.60–10.00)
LYMPH%: 42.2 % (ref 14.0–49.7)
MCH: 27.4 pg (ref 25.1–34.0)
MCHC: 32.4 g/dL (ref 31.5–36.0)
MCV: 84.5 fL (ref 79.5–101.0)
MONO#: 0.4 10*3/uL (ref 0.1–0.9)
MONO%: 7.6 % (ref 0.0–14.0)
NEUT#: 2.5 10*3/uL (ref 1.5–6.5)
NEUT%: 48.4 % (ref 38.4–76.8)
Platelets: 125 10*3/uL — ABNORMAL LOW (ref 145–400)
RBC: 4.38 10*6/uL (ref 3.70–5.45)
RDW: 21.3 % — ABNORMAL HIGH (ref 11.2–14.5)
Retic %: 2.02 % (ref 0.70–2.10)
Retic Ct Abs: 88.48 10*3/uL (ref 33.70–90.70)
WBC: 5.1 10*3/uL (ref 3.9–10.3)
lymph#: 2.2 10*3/uL (ref 0.9–3.3)

## 2015-04-19 MED ORDER — SODIUM CHLORIDE 0.9 % IV SOLN
2160.0000 mg/m2 | INTRAVENOUS | Status: DC
  Administered 2015-04-19: 4800 mg via INTRAVENOUS
  Filled 2015-04-19: qty 96

## 2015-04-19 MED ORDER — SODIUM CHLORIDE 0.9 % IJ SOLN
10.0000 mL | INTRAMUSCULAR | Status: DC | PRN
  Filled 2015-04-19: qty 10

## 2015-04-19 MED ORDER — LEUCOVORIN CALCIUM INJECTION 350 MG
360.0000 mg/m2 | Freq: Once | INTRAMUSCULAR | Status: AC
  Administered 2015-04-19: 800 mg via INTRAVENOUS
  Filled 2015-04-19: qty 40

## 2015-04-19 MED ORDER — FLUOROURACIL CHEMO INJECTION 2.5 GM/50ML
360.0000 mg/m2 | Freq: Once | INTRAVENOUS | Status: AC
  Administered 2015-04-19: 800 mg via INTRAVENOUS
  Filled 2015-04-19: qty 16

## 2015-04-19 MED ORDER — OXYCODONE HCL 5 MG PO TABS: 5.0000 mg | ORAL_TABLET | Freq: Four times a day (QID) | ORAL | Status: DC | PRN

## 2015-04-19 MED ORDER — SODIUM CHLORIDE 0.9 % IV SOLN
Freq: Once | INTRAVENOUS | Status: AC
  Administered 2015-04-19: 12:00:00 via INTRAVENOUS
  Filled 2015-04-19: qty 4

## 2015-04-19 MED ORDER — HEPARIN SOD (PORK) LOCK FLUSH 100 UNIT/ML IV SOLN
500.0000 [IU] | Freq: Once | INTRAVENOUS | Status: DC | PRN
  Filled 2015-04-19: qty 5

## 2015-04-19 MED ORDER — SODIUM CHLORIDE 0.9 % IV SOLN
Freq: Once | INTRAVENOUS | Status: AC
  Administered 2015-04-19: 12:00:00 via INTRAVENOUS

## 2015-04-19 NOTE — Telephone Encounter (Signed)
lvm for pt regarding to May appts.Marland KitchenMarland KitchenMarland KitchenMarland Kitchenpt will get print out in chemo

## 2015-04-19 NOTE — Progress Notes (Signed)
[place]  Telephone:([phone]) [phone] Fax:([phone]) [place] Note   Patient Care Team: Lind Covert, MD as PCP - General Juanita Craver, MD as Consulting Physician (Gastroenterology) [name], MD as Consulting Physician (General Surgery) Truitt Merle, MD as Consulting Physician (Hematology) 04/19/2015  CHIEF COMPLAINTS Follow up colon cancer   DIAGNOSIS:  Colon cancer   Staging form: Colon and Rectum, AJCC 7th Edition     Clinical: Stage IIIB (T3, N1a, M0) - Signed by Truitt Merle, MD on 12/28/2014     Colon cancer   10/10/2014 Imaging CT abdomen and pelvis: Eccentric wall thickening along the sigmoid colon, worrisome for early colonic adenocarcinoma. No findings suspicious for metastatic disease. CT chest (-)      11/14/2014 Initial Diagnosis Colon cancer   11/14/2014 Pathologic Stage pT3pN1pMx, G2, tumor invading through the muscular propria into pericolonic fatty tissue.  LVI (-), PNI (-). 1/28 node positive, margins negative.    11/14/2014 Surgery sigmoid colon segmental resection, margins (-).     HISTORY OF INITIAL PRESENTING ILLNESS:  Jenny [name] 54 y.o. female without a significant past medical history, who is referred by her surgeon Dr. Marcello Moores to discuss adjuvant chemotherapy for her resected colon cancer.  She presented with abdominal pain for 8-9 month, which was related to position. She noticed bloody stool in Aug 2015. She was seen by PCP, and was referred to GI Dr. Collene Mares, and underwent colonoscopy which showed a mass at sigmoid colon (per patient, I do not have the report). Biopsy showed adenocarcinoma. She was then referred to surgeon Dr. Marcello Moores and underwent sigmoid colon segmentectomy on 11/14/2014.   TREATMENT: [id number]  started on 12/28/2014, 10% dose reduction due to fatigue and thrombocytopenia from cycle 5  INTERIM HISTORY: Mrs. Leffel returns for follow-up and 8 th cycle chemo. Her prior cycle was postponed for one week due  to thrombocytopenia. She has been more fatigued after the last few cycle chemo, and she complains of worsening severe leg pain and tingling numbness on fingers and toes. The neck pain and tingling wakes her up at night. She feels some time is unbearable. She does take Neurontin 300 mg at night, which helps some. She has some difficulty buttoning, and drops small things sometime. No fall or balance issue. Her appetite recovers well from chemotherapy, she again some weight lately. No fever or chills.  MEDICAL HISTORY:  Past Medical History  Diagnosis Date  . Enlarged thyroid   . Anemia   . Cancer     colon cancer     SURGICAL HISTORY: Past Surgical History  Procedure Laterality Date  . Tonsillectomy    . Cervical spine surgery  06/17/2012    C5-C7 ACDF  . Cesarean section    . Partial hysterectomy    . Portacath placement Left 12/22/2014    Procedure: INSERTION PORT-A-CATH LEFT SUBCLAVIAN;  Surgeon: [name], MD;  Location: [facility];  Service: General;  Laterality: Left;    SOCIAL HISTORY: History   Social History  . Marital Status: Married    Spouse Name: N/A    Number of Children: 2  . Years of Education: N/A   Occupational History  . Not on file.   Social History Main Topics  . Smoking status: Never Smoker   . Smokeless tobacco: Never Used  . Alcohol Use: No  . Drug Use: No  . Sexual Activity: Not on file    P2G2, one child was murdered. She is a Paramedic.  FAMILY HISTORY: Family History  Problem Relation Age of Onset  . Colon cancer Neg Hx   . Liver Cancer Brother        . Hypertension Mother     ALLERGIES:  is allergic to other and shellfish allergy.  MEDICATIONS:  Current Outpatient Prescriptions  Medication Sig Dispense Refill  . diphenhydrAMINE (BENADRYL) 12.5 MG/5ML liquid Take 12.5-25 mg by mouth every 4 (four) hours as needed for itching or allergies.    Marland Kitchen gabapentin (NEURONTIN) 100 MG capsule Take 1 capsule (100 mg total) by mouth  3 (three) times daily. 90 capsule 1  . Hydrocodone-Acetaminophen (VICODIN) 5-300 MG TABS Take 1 tablet by mouth every 21 ( twenty-one) days. 5 each 0  . hydrocortisone cream 0.5 % Apply 1 application topically 2 (two) times daily as needed for itching.    . lidocaine-prilocaine (EMLA) cream Apply 1 application topically as needed. 30 g 2  . ondansetron (ZOFRAN) 8 MG tablet Take 1 by mouth twice a day for 2 days after chemotherapy and then twice daily as needed 30 tablet 1  . oxyCODONE (OXY IR/ROXICODONE) 5 MG immediate release tablet Take 1 tablet (5 mg total) by mouth every 6 (six) hours as needed for severe pain. 60 tablet 0  . triamcinolone (KENALOG) 0.025 % ointment Apply 1 application topically 2 (two) times daily. 30 g 1   No current facility-administered medications for this visit.    REVIEW OF SYSTEMS:   Constitutional: Denies fevers, chills or abnormal night sweats Eyes: Denies blurriness of vision, double vision or watery eyes Ears, nose, mouth, throat, and face: Denies mucositis or sore throat Respiratory: Denies cough, dyspnea or wheezes Cardiovascular: Denies palpitation, chest discomfort or lower extremity swelling Gastrointestinal:  Denies nausea, heartburn or change in bowel habits Skin: Denies abnormal skin rashes Lymphatics: Denies new lymphadenopathy or easy bruising Neurological:Denies numbness, tingling or new weaknesses Behavioral/Psych: Mood is stable, no new changes  All other systems were reviewed with the patient and are negative.  PHYSICAL EXAMINATION: ECOG PERFORMANCE STATUS: 2 Vital sign was reviewed, normal.  GENERAL:alert, no distress and comfortable SKIN: skin color, texture, turgor are normal, no rashes or significant lesions EYES: normal, conjunctiva are pink and non-injected, sclera clear OROPHARYNX:no exudate, no erythema and lips, buccal mucosa, and tongue normal  NECK: supple, thyroid normal size, non-tender, without nodularity LYMPH:  no palpable  lymphadenopathy in the cervical, axillary or inguinal LUNGS: clear to auscultation and percussion with normal breathing effort HEART: regular rate & rhythm and no murmurs and no lower extremity edema ABDOMEN:abdomen soft, non-tender and normal bowel sounds. Surgical wound has well-healed Musculoskeletal:no cyanosis of digits and no clubbing  PSYCH: alert & oriented x 3 with fluent speech NEURO: no focal motor/sensory deficits  LABORATORY DATA:  I have reviewed the data as listed CBC Latest Ref Rng 04/19/2015 04/05/2015 03/28/2015  WBC 3.9 - 10.3 10e3/uL 5.1 4.6 4.1  Hemoglobin 11.6 - 15.9 g/dL 12.0 12.4 11.6  Hematocrit 34.8 - 46.6 % 37.0 38.7 35.8  Platelets 145 - 400 10e3/uL 125(L) 132(L) 76(L)    CMP Latest Ref Rng 04/05/2015 03/28/2015 03/15/2015  Glucose 70 - 140 mg/dl 152(H) 107 118  BUN 7.0 - 26.0 mg/dL 11.4 16.4 12.7  Creatinine 0.6 - 1.1 mg/dL 0.8 0.8 0.7  Sodium 136 - 145 mEq/L 141 141 142  Potassium 3.5 - 5.1 mEq/L 3.8 3.9 4.1  Chloride 96 - 112 mEq/L - - -  CO2 22 - 29 mEq/L $Remove'25 22 24  'pMkZemo$ Calcium 8.4 - 10.4  mg/dL 9.4 9.3 9.3  Total Protein 6.4 - 8.3 g/dL 7.7 7.8 7.6  Total Bilirubin 0.20 - 1.20 mg/dL 0.48 0.45 0.36  Alkaline Phos 40 - 150 U/L 77 84 70  AST 5 - 34 U/L 32 28 27  ALT 0 - 55 U/L 32 25 26     PATHOLOGY REPORT 11/14/2014 Diagnosis(continued) 1. Colon, segmental resection for tumor, sigmoid - INVASIVE ADENOCARCINOMA, INVADING THROUGH THE MUSCULARIS PROPRIA INTO PERICOLONIC FATTY TISSUE. - ONE OF TWENTY-EIGHT LYMPH NODES, POSITIVE FOR METASTATIC CARCINOMA (1/28). - RESECTION MARGINS, NEGATIVE FOR ATYPIA OR MALIGNANCY - RESECTION MARGINS, NEGATIVE FOR ATYPIA OR MALIGNANCY. 2. Colon, resection margin (donut), colon final distal margin - BENIGN COLONIC MUCOSA, NO EVIDENCE OF MALIGNANCY. Microscopic Comment 1. COLON Specimen: Sigmoid colon Procedure: Segmental resection Tumor site: Sigmoid colon Specimen integrity: Intact Macroscopic intactness of mesorectum:  N/A Macroscopic tumor perforation: No Invasive tumor: Maximum size: 2.7 cm, gross measurement Histologic type(s): Invasive adenocarcinoma Histologic grade and differentiation: G2: moderately differentiated/low grade Type of polyp tumor arose from: Tubular adenoma Microscopic extension of invasive tumor: Invading through the muscularis propria into pericolonic fatty tissue. Lymph-Vascular invasion: Not identified Peri-neural invasion: Not identified Tumor deposit(s) (discontinuous extramural extension): N/A Resection margins: Negative Open margin: 7.1 cm Stapled margin: 5.5 cm Mesenteric margin (sigmoid and transverse): 9 cm Treatment effect (neo-adjuvant therapy): No Additional polyp(s): N/A Non-neoplastic findings: N/A Lymph nodes: number examined 28; number positive: 1 Pathologic Staging: pT3, pN1a, pMX Ancillary studies: MMR stains will be performed and an addendum report will follow. MSI testing will be performed at an outside institution and the results will be available in EPIC. (HCL:kh 11-15-14)  TUMOR MSI: stable (by PCR),  MMR: NORMAL   RADIOGRAPHIC STUDIES: I have personally reviewed the radiological images as listed and agreed with the findings in the report.  CT chest 10/25/2014 IMPRESSION: No CT findings for pulmonary metastatic disease. No mediastinal or hilar mass or adenopathy.  CT abdomen Pelvis w contrast 10/10/2014 Eccentric wall thickening along the sigmoid colon, likely corresponding to the finding on colonoscopy, worrisome for early colonic adenocarcinoma.  No findings suspicious for metastatic disease.  ASSESSMENT & PLAN:  54 year old African-American female, without significant past medical history, presents with abdominal pain and bloody bowel movement, anemia, was found to have a sigmoid adenocarcinoma, status post complete surgical resection.   1. Sigmoid colon adenocarcinoma, G2, pT3pN1aM0, stage IIIB, MSI stable  -She is on adjuvant FOLFOX  for total of 12 cycles planned. The goal of treatment is curative.  -She tolerated the first 4 cycle well with some expected side effects, but fatigued, and anorexia seems getting worse with each cycle. -Due to her thrombocytopenia and fatigue, chemo dose was reduced by 10% from cycle 5  -Her neuropathy and leg pain are getting much worse lately, despite a dose reduction on oxaliplatin and 5-FU. She has received 7 cycles, I'll stop of the platinum from this cycle.  -She still has mild some cytopenia, but adequate for treatment since I'm dropping off the oxaliplatin today. We'll proceed with 5-FU only today -We'll change to capecitabine from next cycle if her insurance company covers it.    2. body pain, secondary to chemo? -She states she has diffuse body pain after chemotherapy, she takes Tylenol and ibuprofen but not sufficient. -I give her her refill of oxycodone today.  3. Microcytic anemia, likely iron deficient anemia secondary to colon cancer, and chemotherapy related anemia -Her serum iron and saturation were low. Likely from her colon cancer relates.  -Her anemia  improved after IV Feraheme.  -follow ferritin level.  4. 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 The patient knows to call the clinic with any problems,  questions or concerns.  I spent 20 minutes counseling the patient face to face. The total time spent in the appointment was 25 minutes and more than 50% was on counseling.     Truitt Merle, MD 04/19/2015 10:59 AM

## 2015-04-19 NOTE — Progress Notes (Signed)
Patient needing to use rest room.  When she stood up she was very dizzy.  Steady used to transport pt. To and from restroom.  Patient still feels very tired and unsteady.  Sister in law is here and states patient has been up since 4am.  Patient states she has not eaten since this am.  [place] sandwhich and drink given to patient, as well as cheese and crackers.  VS taken and then rechecked.  Patient states she is feeling better as she eats. At discharge - patient is still feeling very weak.  Selena Lesser here to see patient.

## 2015-04-19 NOTE — Patient Instructions (Signed)
[facility] Discharge Instructions for Patients Receiving Chemotherapy  Today you received the following chemotherapy agents 5 FU/Leucovorin To help prevent nausea and vomiting after your treatment, we encourage you to take your nausea medication as prescribed. If you develop nausea and vomiting that is not controlled by your nausea medication, call the clinic.   BELOW ARE SYMPTOMS THAT SHOULD BE REPORTED IMMEDIATELY:  *FEVER GREATER THAN 100.5 F  *CHILLS WITH OR WITHOUT FEVER  NAUSEA AND VOMITING THAT IS NOT CONTROLLED WITH YOUR NAUSEA MEDICATION  *UNUSUAL SHORTNESS OF BREATH  *UNUSUAL BRUISING OR BLEEDING  TENDERNESS IN MOUTH AND THROAT WITH OR WITHOUT PRESENCE OF ULCERS  *URINARY PROBLEMS  *BOWEL PROBLEMS  UNUSUAL RASH Items with * indicate a potential emergency and should be followed up as soon as possible.  Feel free to call the clinic you have any questions or concerns. The clinic phone number is [phone].  Please show the CHEMO ALERT CARD at check-in to the Emergency Department and triage nurse.   

## 2015-04-20 ENCOUNTER — Encounter: Payer: Self-pay | Admitting: Nurse Practitioner

## 2015-04-20 ENCOUNTER — Encounter: Payer: Self-pay | Admitting: Hematology

## 2015-04-20 DIAGNOSIS — R531 Weakness: Secondary | ICD-10-CM | POA: Insufficient documentation

## 2015-04-20 MED ORDER — CAPECITABINE 500 MG PO TABS
2500.0000 mg | ORAL_TABLET | Freq: Two times a day (BID) | ORAL | Status: DC
Start: 2015-04-20 — End: 2015-07-18

## 2015-04-20 NOTE — Assessment & Plan Note (Signed)
Patient had completed all of her chemotherapy this afternoon; and was ready to be discharged to home- when she complained of some mild, generalized weakness.  Patient states that she has been up since 4 AM this morning; it has very little to eat all day.  On exam-patient appeared neurologically intact; but tired.  Patient was given a high protein snack which consisted of cheese, [place] sandwich, soda, and orange juice.  Following patient's neck-patient felt well enough to go home with family.  Patient advised to call/return or go directly to the emergency department if she develops any worsening symptoms whatsoever.

## 2015-04-20 NOTE — Assessment & Plan Note (Signed)
Patient is status post colon resection in November 2015.  Patient met earlier today with Dr.Feng; and decision was made to proceed today with only the 5-FU portion of her chemotherapy infusion due to continued poor tolerability of the oxaliplatin.  Patient has plans to return on 05/03/2015 for labs, follow up visit, and her next chemotherapy.

## 2015-04-20 NOTE — Progress Notes (Signed)
[facility]   HPI: Jenny [name] 54 y.o. female diagnosed with colon cancer.  Patient is status post colon resection.  Patient has been undergoing FOLFOX chemotherapy regimen; but decision was made today to switch to 5-FU chemotherapy only due to poor tolerability of the oxaliplatin.   Patient had completed all of her chemotherapy this afternoon; and was ready to be discharged to home- when she complained of some mild, generalized weakness.  Patient states that she has been up since 4 AM this morning; and has had very little to eat all day.    HPI  ROS  Past Medical History  Diagnosis Date  . Enlarged thyroid   . Anemia   . Cancer     colon cancer     Past Surgical History  Procedure Laterality Date  . Tonsillectomy    . Cervical spine surgery  06/17/2012    C5-C7 ACDF  . Cesarean section    . Partial hysterectomy    . Portacath placement Left 12/22/2014    Procedure: INSERTION PORT-A-CATH LEFT SUBCLAVIAN;  Surgeon: [name], MD;  Location: [facility];  Service: General;  Laterality: Left;    has OBESITY; Loss or death of family member; Chest pain; SOB (shortness of breath); Rectal bleeding; Colon cancer; Thyromegaly; Contact dermatitis; and Weakness on her problem list.    is allergic to other and shellfish allergy.    Medication List       This list is accurate as of: 04/19/15 11:59 PM.  Always use your most recent med list.               capecitabine 500 MG tablet  Commonly known as:  XELODA  Take 5 tablets (2,500 mg total) by mouth 2 (two) times daily after a meal.     diphenhydrAMINE 12.5 MG/5ML liquid  Commonly known as:  BENADRYL  Take 12.5-25 mg by mouth every 4 (four) hours as needed for itching or allergies.     gabapentin 100 MG capsule  Commonly known as:  NEURONTIN  Take 1 capsule (100 mg total) by mouth 3 (three) times daily.     Hydrocodone-Acetaminophen 5-300 MG Tabs  Commonly known as:  VICODIN  Take 1 tablet by mouth  every 21 ( twenty-one) days.     hydrocortisone cream 0.5 %  Apply 1 application topically 2 (two) times daily as needed for itching.     lidocaine-prilocaine cream  Commonly known as:  EMLA  Apply 1 application topically as needed.     ondansetron 8 MG tablet  Commonly known as:  ZOFRAN  Take 1 by mouth twice a day for 2 days after chemotherapy and then twice daily as needed     oxyCODONE 5 MG immediate release tablet  Commonly known as:  Oxy IR/ROXICODONE  Take 1 tablet (5 mg total) by mouth every 6 (six) hours as needed for severe pain.     triamcinolone 0.025 % ointment  Commonly known as:  KENALOG  Apply 1 application topically 2 (two) times daily.         PHYSICAL EXAMINATION  Oncology Vitals 04/19/2015 04/19/2015 04/19/2015 04/19/2015 04/19/2015 04/07/2015 04/05/2015  Height - - - - 160 cm - -  Weight - - - - 113.263 kg - -  Weight (lbs) - - - - 249 lbs 11 oz - -  BMI (kg/m2) - - - - 44.23 kg/m2 - -  Temp 97 98.6 - 98.9 98.2 98.6 98.1  Pulse 64 64 60 52  70 73 63  Resp 19 16 - 18 18 - -  SpO2 98 100 - 100 100 100 -  BSA (m2) - - - - 2.24 m2 - -   BP Readings from Last 3 Encounters:  04/19/15 150/94  04/19/15 122/84  04/07/15 106/62    Physical Exam  Constitutional: She is oriented to person, place, and time and well-developed, well-nourished, and in no distress.  HENT:  Head: Normocephalic and atraumatic.  Mouth/Throat: Oropharynx is clear and moist.  Eyes: Conjunctivae and EOM are normal. Pupils are equal, round, and reactive to light. Right eye exhibits no discharge. Left eye exhibits no discharge. No scleral icterus.  Neck: Normal range of motion. Neck supple.  Pulmonary/Chest: Effort normal. No stridor. No respiratory distress.  Musculoskeletal: Normal range of motion.  Neurological: She is alert and oriented to person, place, and time.  Skin: Skin is warm and dry.  Psychiatric: Affect normal.  Nursing note and vitals reviewed.   LABORATORY  DATA:. Appointment on 04/19/2015  Component Date Value Ref Range Status  . Sodium 04/19/2015 142  136 - 145 mEq/L Final  . Potassium 04/19/2015 4.2  3.5 - 5.1 mEq/L Final  . Chloride 04/19/2015 109  98 - 109 mEq/L Final  . CO2 04/19/2015 23  22 - 29 mEq/L Final  . Glucose 04/19/2015 125  70 - 140 mg/dl Final  . BUN 04/19/2015 12.8  7.0 - 26.0 mg/dL Final  . Creatinine 04/19/2015 0.8  0.6 - 1.1 mg/dL Final  . Total Bilirubin 04/19/2015 0.34  0.20 - 1.20 mg/dL Final  . Alkaline Phosphatase 04/19/2015 99  40 - 150 U/L Final  . AST 04/19/2015 22  5 - 34 U/L Final  . ALT 04/19/2015 19  0 - 55 U/L Final  . Total Protein 04/19/2015 7.8  6.4 - 8.3 g/dL Final  . Albumin 04/19/2015 3.7  3.5 - 5.0 g/dL Final  . Calcium 04/19/2015 10.0  8.4 - 10.4 mg/dL Final  . Anion Gap 04/19/2015 11  3 - 11 mEq/L Final  . EGFR 04/19/2015 >90  >90 ml/min/1.73 m2 Final   eGFR is calculated using the CKD-EPI Creatinine Equation (2009)  . WBC 04/19/2015 5.1  3.9 - 10.3 10e3/uL Final  . NEUT# 04/19/2015 2.5  1.5 - 6.5 10e3/uL Final  . HGB 04/19/2015 12.0  11.6 - 15.9 g/dL Final  . HCT 04/19/2015 37.0  34.8 - 46.6 % Final  . Platelets 04/19/2015 125* 145 - 400 10e3/uL Final  . MCV 04/19/2015 84.5  79.5 - 101.0 fL Final  . MCH 04/19/2015 27.4  25.1 - 34.0 pg Final  . MCHC 04/19/2015 32.4  31.5 - 36.0 g/dL Final  . RBC 04/19/2015 4.38  3.70 - 5.45 10e6/uL Final  . RDW 04/19/2015 21.3* 11.2 - 14.5 % Final  . lymph# 04/19/2015 2.2  0.9 - 3.3 10e3/uL Final  . MONO# 04/19/2015 0.4  0.1 - 0.9 10e3/uL Final  . Eosinophils Absolute 04/19/2015 0.1  0.0 - 0.5 10e3/uL Final  . Basophils Absolute 04/19/2015 0.0  0.0 - 0.1 10e3/uL Final  . NEUT% 04/19/2015 48.4  38.4 - 76.8 % Final  . LYMPH% 04/19/2015 42.2  14.0 - 49.7 % Final  . MONO% 04/19/2015 7.6  0.0 - 14.0 % Final  . EOS% 04/19/2015 1.4  0.0 - 7.0 % Final  . BASO% 04/19/2015 0.4  0.0 - 2.0 % Final  . Retic % 04/19/2015 2.02  0.70 - 2.10 % Final  . Retic Ct Abs  04/19/2015 88.48  33.70 - 90.70 10e3/uL Final  . Immature Retic Fract 04/19/2015 21.40* 1.60 - 10.00 % Final     RADIOGRAPHIC STUDIES: No results found.  ASSESSMENT/PLAN:    Colon cancer Patient is status post colon resection in November 2015.  Patient met earlier today with Dr.Feng; and decision was made to proceed today with only the 5-FU portion of her chemotherapy infusion due to continued poor tolerability of the oxaliplatin.  Patient has plans to return on 05/03/2015 for labs, follow up visit, and her next chemotherapy.   Weakness Patient had completed all of her chemotherapy this afternoon; and was ready to be discharged to home- when she complained of some mild, generalized weakness.  Patient states that she has been up since 4 AM this morning; it has very little to eat all day.  On exam-patient appeared neurologically intact; but tired.  Patient was given a high protein snack which consisted of cheese, [place] sandwich, soda, and orange juice.  Following patient's neck-patient felt well enough to go home with family.  Patient advised to call/return or go directly to the emergency department if she develops any worsening symptoms whatsoever.   Patient stated understanding of all instructions; and was in agreement with this plan of care. The patient knows to call the clinic with any problems, questions or concerns.   Review/collaboration with Dr. Burr Medico regarding all aspects of patient's visit today.   Total time spent with patient was 15 minutes;  with greater than 75 percent of that time spent in face to face counseling regarding patient's symptoms,  and coordination of care and follow up.  Disclaimer: This note was dictated with voice recognition software. Similar sounding words can inadvertently be transcribed and may not be corrected upon review.   Drue Second, NP 04/20/2015

## 2015-04-21 ENCOUNTER — Ambulatory Visit (HOSPITAL_BASED_OUTPATIENT_CLINIC_OR_DEPARTMENT_OTHER): Payer: BC Managed Care – PPO

## 2015-04-21 ENCOUNTER — Telehealth: Payer: Self-pay | Admitting: *Deleted

## 2015-04-21 VITALS — BP 115/70 | HR 65 | Temp 98.5°F | Resp 16

## 2015-04-21 DIAGNOSIS — C187 Malignant neoplasm of sigmoid colon: Secondary | ICD-10-CM | POA: Diagnosis not present

## 2015-04-21 DIAGNOSIS — C189 Malignant neoplasm of colon, unspecified: Secondary | ICD-10-CM

## 2015-04-21 MED ORDER — HEPARIN SOD (PORK) LOCK FLUSH 100 UNIT/ML IV SOLN
500.0000 [IU] | Freq: Once | INTRAVENOUS | Status: AC | PRN
  Administered 2015-04-21: 500 [IU]
  Filled 2015-04-21: qty 5

## 2015-04-21 MED ORDER — SODIUM CHLORIDE 0.9 % IJ SOLN
10.0000 mL | INTRAMUSCULAR | Status: DC | PRN
  Administered 2015-04-21: 10 mL
  Filled 2015-04-21: qty 10

## 2015-04-21 NOTE — Telephone Encounter (Signed)
TC to pt- LM for pt to rtn call checking status

## 2015-04-24 ENCOUNTER — Telehealth: Payer: Self-pay | Admitting: *Deleted

## 2015-04-24 NOTE — Telephone Encounter (Signed)
Patient's husband called to say pharmacy called to say prescription was ready for pick up. He states this is making his wife stressed out as she is not a pill taker, they are big pills, high copay and she wants to finish out IV chemo. Would like to talk with Dr Burr Medico further about this prescription. Could not give name of RX. Will forward to Dr Burr Medico.

## 2015-04-26 ENCOUNTER — Telehealth: Payer: Self-pay | Admitting: *Deleted

## 2015-04-26 NOTE — Telephone Encounter (Signed)
I spoke with pt and answered her questions. She will discuss with her husband and let us know if she decides to take Xeloda pill or 5-FU infusion. If she is going to take xeloda, please cancel her infusion appointment on May 11.  Truitt Merle  04/26/2015

## 2015-04-26 NOTE — Telephone Encounter (Signed)
TC from patient with numerous questions regarding her new Xeloda pills and if she will be getting any further IV chemo, questions about co-pay for Xeloda etc. Please call patient as soon as you are able.

## 2015-05-03 ENCOUNTER — Encounter: Payer: Self-pay | Admitting: *Deleted

## 2015-05-03 ENCOUNTER — Encounter: Payer: Self-pay | Admitting: Hematology

## 2015-05-03 ENCOUNTER — Ambulatory Visit (HOSPITAL_BASED_OUTPATIENT_CLINIC_OR_DEPARTMENT_OTHER): Payer: BC Managed Care – PPO | Admitting: Hematology

## 2015-05-03 ENCOUNTER — Ambulatory Visit (HOSPITAL_BASED_OUTPATIENT_CLINIC_OR_DEPARTMENT_OTHER): Payer: BC Managed Care – PPO

## 2015-05-03 ENCOUNTER — Telehealth: Payer: Self-pay | Admitting: Hematology

## 2015-05-03 VITALS — BP 139/73 | HR 65 | Temp 98.0°F | Resp 19 | Ht 63.0 in | Wt 254.4 lb

## 2015-05-03 DIAGNOSIS — C187 Malignant neoplasm of sigmoid colon: Secondary | ICD-10-CM

## 2015-05-03 DIAGNOSIS — Z5111 Encounter for antineoplastic chemotherapy: Secondary | ICD-10-CM | POA: Diagnosis not present

## 2015-05-03 DIAGNOSIS — C189 Malignant neoplasm of colon, unspecified: Secondary | ICD-10-CM

## 2015-05-03 DIAGNOSIS — G620 Drug-induced polyneuropathy: Secondary | ICD-10-CM | POA: Diagnosis not present

## 2015-05-03 DIAGNOSIS — D696 Thrombocytopenia, unspecified: Secondary | ICD-10-CM | POA: Diagnosis not present

## 2015-05-03 DIAGNOSIS — D509 Iron deficiency anemia, unspecified: Secondary | ICD-10-CM | POA: Diagnosis not present

## 2015-05-03 DIAGNOSIS — R52 Pain, unspecified: Secondary | ICD-10-CM

## 2015-05-03 LAB — COMPREHENSIVE METABOLIC PANEL (CC13)
ALT: 18 U/L (ref 0–55)
AST: 22 U/L (ref 5–34)
Albumin: 3.6 g/dL (ref 3.5–5.0)
Alkaline Phosphatase: 77 U/L (ref 40–150)
Anion Gap: 8 mEq/L (ref 3–11)
BUN: 12 mg/dL (ref 7.0–26.0)
CO2: 23 mEq/L (ref 22–29)
Calcium: 9 mg/dL (ref 8.4–10.4)
Chloride: 109 mEq/L (ref 98–109)
Creatinine: 0.8 mg/dL (ref 0.6–1.1)
EGFR: >90 mL/min/{1.73_m2} (ref >90)
Glucose: 134 mg/dl (ref 70–140)
Potassium: 4 mEq/L (ref 3.5–5.1)
Sodium: 140 mEq/L (ref 136–145)
Total Bilirubin: 0.28 mg/dL (ref 0.20–1.20)
Total Protein: 7.2 g/dL (ref 6.4–8.3)

## 2015-05-03 LAB — CBC & DIFF AND RETIC
BASO%: 0.3 % (ref 0.0–2.0)
Basophils Absolute: 0 10*3/uL (ref 0.0–0.1)
EOS%: 3 % (ref 0.0–7.0)
Eosinophils Absolute: 0.1 10*3/uL (ref 0.0–0.5)
HCT: 35.6 % (ref 34.8–46.6)
HGB: 11.7 g/dL (ref 11.6–15.9)
Immature Retic Fract: 14 % — ABNORMAL HIGH (ref 1.60–10.00)
LYMPH%: 54.6 % — ABNORMAL HIGH (ref 14.0–49.7)
MCH: 28.1 pg (ref 25.1–34.0)
MCHC: 32.9 g/dL (ref 31.5–36.0)
MCV: 85.4 fL (ref 79.5–101.0)
MONO#: 0.3 10*3/uL (ref 0.1–0.9)
MONO%: 6.8 % (ref 0.0–14.0)
NEUT#: 1.3 10*3/uL — ABNORMAL LOW (ref 1.5–6.5)
NEUT%: 35.3 % — ABNORMAL LOW (ref 38.4–76.8)
Platelets: 135 10*3/uL — ABNORMAL LOW (ref 145–400)
RBC: 4.17 10*6/uL (ref 3.70–5.45)
RDW: 19 % — ABNORMAL HIGH (ref 11.2–14.5)
Retic %: 1.8 % (ref 0.70–2.10)
Retic Ct Abs: 75.06 10*3/uL (ref 33.70–90.70)
WBC: 3.7 10*3/uL — ABNORMAL LOW (ref 3.9–10.3)
lymph#: 2 10*3/uL (ref 0.9–3.3)

## 2015-05-03 MED ORDER — HEPARIN SOD (PORK) LOCK FLUSH 100 UNIT/ML IV SOLN
500.0000 [IU] | Freq: Once | INTRAVENOUS | Status: DC | PRN
  Filled 2015-05-03: qty 5

## 2015-05-03 MED ORDER — SODIUM CHLORIDE 0.9 % IV SOLN
INTRAVENOUS | Status: DC
  Administered 2015-05-03: 11:00:00 via INTRAVENOUS

## 2015-05-03 MED ORDER — SODIUM CHLORIDE 0.9 % IJ SOLN
10.0000 mL | INTRAMUSCULAR | Status: DC | PRN
  Filled 2015-05-03: qty 10

## 2015-05-03 MED ORDER — SODIUM CHLORIDE 0.9 % IV SOLN
2160.0000 mg/m2 | INTRAVENOUS | Status: DC
  Administered 2015-05-03: 4800 mg via INTRAVENOUS
  Filled 2015-05-03: qty 96

## 2015-05-03 MED ORDER — FLUOROURACIL CHEMO INJECTION 2.5 GM/50ML
360.0000 mg/m2 | Freq: Once | INTRAVENOUS | Status: AC
  Administered 2015-05-03: 800 mg via INTRAVENOUS
  Filled 2015-05-03: qty 16

## 2015-05-03 MED ORDER — LEUCOVORIN CALCIUM INJECTION 350 MG
360.0000 mg/m2 | Freq: Once | INTRAMUSCULAR | Status: AC
  Administered 2015-05-03: 800 mg via INTRAVENOUS
  Filled 2015-05-03: qty 40

## 2015-05-03 MED ORDER — SODIUM CHLORIDE 0.9 % IV SOLN
Freq: Once | INTRAVENOUS | Status: AC
  Administered 2015-05-03: 11:00:00 via INTRAVENOUS
  Filled 2015-05-03: qty 4

## 2015-05-03 NOTE — Patient Instructions (Signed)
[facility] Discharge Instructions for Patients Receiving Chemotherapy  Today you received the following chemotherapy agents: leucovorin, 39fu  To help prevent nausea and vomiting after your treatment, we encourage you to take your nausea medication.  Take it as often as prescribed.     If you develop nausea and vomiting that is not controlled by your nausea medication, call the clinic. If it is after clinic hours your family physician or the after hours number for the clinic or go to the Emergency Department.   BELOW ARE SYMPTOMS THAT SHOULD BE REPORTED IMMEDIATELY:  *FEVER GREATER THAN 100.5 F  *CHILLS WITH OR WITHOUT FEVER  NAUSEA AND VOMITING THAT IS NOT CONTROLLED WITH YOUR NAUSEA MEDICATION  *UNUSUAL SHORTNESS OF BREATH  *UNUSUAL BRUISING OR BLEEDING  TENDERNESS IN MOUTH AND THROAT WITH OR WITHOUT PRESENCE OF ULCERS  *URINARY PROBLEMS  *BOWEL PROBLEMS  UNUSUAL RASH Items with * indicate a potential emergency and should be followed up as soon as possible.  Feel free to call the clinic you have any questions or concerns. The clinic phone number is [phone].   I have been informed and understand all the instructions given to me. I know to contact the clinic, my physician, or go to the Emergency Department if any problems should occur. I do not have any questions at this time, but understand that I may call the clinic during office hours   should I have any questions or need assistance in obtaining follow up care.    __________________________________________  _____________  __________ Signature of Patient or Authorized Representative            Date                   Time    __________________________________________ Nurse's Signature

## 2015-05-03 NOTE — [facility] Oncology Navigator Note (Signed)
Met with patient and husband during follow up visit. Explained the role of the GI Nurse Navigator and provided contact information. Discussed briefly the co pay $100 for her Xeloda and potential side effects. She and her husband have decided to proceed with the infusional 5FU at this time. Husband brought up that patient has had a lot of weight gain and feels this could be increasing her fatigue and leg aches. Inquired if she would like nutritionist to make appointment with her to discuss a healthy diet in general? She agrees.   , RN, BSN GI Oncology Navigator [facility] 

## 2015-05-03 NOTE — Progress Notes (Signed)
Pt saw Dr. Burr Medico today prior to chemo.  OK to treat with all lab results today per md.

## 2015-05-03 NOTE — Progress Notes (Signed)
[facility]  [phone] Fax:([phone]) [place] Note   Patient Care Team: Lind Covert, MD as PCP - General Juanita Craver, MD as Consulting Physician (Gastroenterology) [name], MD as Consulting Physician (General Surgery) Truitt Merle, MD as Consulting Physician (Hematology) 05/03/2015  CHIEF COMPLAINTS Follow up colon cancer   DIAGNOSIS:  Colon cancer   Staging form: Colon and Rectum, AJCC 7th Edition     Clinical: Stage IIIB (T3, N1a, M0) - Signed by Truitt Merle, MD on 12/28/2014     Colon cancer   10/10/2014 Imaging CT abdomen and pelvis: Eccentric wall thickening along the sigmoid colon, worrisome for early colonic adenocarcinoma. No findings suspicious for metastatic disease. CT chest (-)      11/14/2014 Initial Diagnosis Colon cancer   11/14/2014 Pathologic Stage pT3pN1pMx, G2, tumor invading through the muscular propria into pericolonic fatty tissue.  LVI (-), PNI (-). 1/28 node positive, margins negative.    11/14/2014 Surgery sigmoid colon segmental resection, margins (-).     HISTORY OF INITIAL PRESENTING ILLNESS:  Jenny [name] 54 y.o. female without a significant past medical history, who is referred by her surgeon Dr. Marcello Moores to discuss adjuvant chemotherapy for her resected colon cancer.  She presented with abdominal pain for 8-9 month, which was related to position. She noticed bloody stool in Aug 2015. She was seen by PCP, and was referred to GI Dr. Collene Mares, and underwent colonoscopy which showed a mass at sigmoid colon (per patient, I do not have the report). Biopsy showed adenocarcinoma. She was then referred to surgeon Dr. Marcello Moores and underwent sigmoid colon segmentectomy on 11/14/2014.   TREATMENT: [id number]  started on 12/28/2014, 10% dose reduction due to fatigue and thrombocytopenia from cycle 5, oxaliplatin held from cycle 8 due to leg pain and neuropathy  INTERIM HISTORY: Mrs. Willers returns for follow-up and 9th  cycle chemo. She presents with her husband today. She still has persistent severe back pain, and mild to moderate neuropathy on fingers and toes. Although she did not get oxaliplatin with last cycle chemotherapy, she did not feel much better compared to before. She is still able to function well at home, Fine motor function of fingers I slightly impact, but overall still preserved. She has good appetite, and gained about 10 pounds in the past 2 months.     MEDICAL HISTORY:  Past Medical History  Diagnosis Date  . Enlarged thyroid   . Anemia   . Cancer     colon cancer     SURGICAL HISTORY: Past Surgical History  Procedure Laterality Date  . Tonsillectomy    . Cervical spine surgery  06/17/2012    C5-C7 ACDF  . Cesarean section    . Partial hysterectomy    . Portacath placement Left 12/22/2014    Procedure: INSERTION PORT-A-CATH LEFT SUBCLAVIAN;  Surgeon: [name], MD;  Location: [facility];  Service: General;  Laterality: Left;    SOCIAL HISTORY: History   Social History  . Marital Status: Married    Spouse Name: N/A    Number of Children: 2  . Years of Education: N/A   Occupational History  . Not on file.   Social History Main Topics  . Smoking status: Never Smoker   . Smokeless tobacco: Never Used  . Alcohol Use: No  . Drug Use: No  . Sexual Activity: Not on file    P2G2, one child was murdered. She is a Paramedic.   FAMILY HISTORY: Family  History  Problem Relation Age of Onset  . Colon cancer Neg Hx   . Liver Cancer Brother        . Hypertension Mother     ALLERGIES:  is allergic to other and shellfish allergy.  MEDICATIONS:  Current Outpatient Prescriptions  Medication Sig Dispense Refill  . diphenhydrAMINE (BENADRYL) 12.5 MG/5ML liquid Take 12.5-25 mg by mouth every 4 (four) hours as needed for itching or allergies.    Marland Kitchen gabapentin (NEURONTIN) 100 MG capsule Take 1 capsule (100 mg total) by mouth 3 (three) times daily. 90 capsule 1  .  Hydrocodone-Acetaminophen (VICODIN) 5-300 MG TABS Take 1 tablet by mouth every 21 ( twenty-one) days. 5 each 0  . hydrocortisone cream 0.5 % Apply 1 application topically 2 (two) times daily as needed for itching.    . lidocaine-prilocaine (EMLA) cream Apply 1 application topically as needed. 30 g 2  . ondansetron (ZOFRAN) 8 MG tablet Take 1 by mouth twice a day for 2 days after chemotherapy and then twice daily as needed 30 tablet 1  . oxyCODONE (OXY IR/ROXICODONE) 5 MG immediate release tablet Take 1 tablet (5 mg total) by mouth every 6 (six) hours as needed for severe pain. 60 tablet 0  . triamcinolone (KENALOG) 0.025 % ointment Apply 1 application topically 2 (two) times daily. 30 g 1  . capecitabine (XELODA) 500 MG tablet Take 5 tablets (2,500 mg total) by mouth 2 (two) times daily after a meal. (Patient not taking: Reported on 05/03/2015) 140 tablet 0   No current facility-administered medications for this visit.    REVIEW OF SYSTEMS:   Constitutional: Denies fevers, chills or abnormal night sweats Eyes: Denies blurriness of vision, double vision or watery eyes Ears, nose, mouth, throat, and face: Denies mucositis or sore throat Respiratory: Denies cough, dyspnea or wheezes Cardiovascular: Denies palpitation, chest discomfort or lower extremity swelling Gastrointestinal:  Denies nausea, heartburn or change in bowel habits Skin: Denies abnormal skin rashes Lymphatics: Denies new lymphadenopathy or easy bruising Neurological:Denies numbness, tingling or new weaknesses Behavioral/Psych: Mood is stable, no new changes  All other systems were reviewed with the patient and are negative.  PHYSICAL EXAMINATION: ECOG PERFORMANCE STATUS: 2 Vital sign was reviewed, normal.  GENERAL:alert, no distress and comfortable SKIN: skin color, texture, turgor are normal, no rashes or significant lesions EYES: normal, conjunctiva are pink and non-injected, sclera clear OROPHARYNX:no exudate, no erythema  and lips, buccal mucosa, and tongue normal  NECK: supple, thyroid normal size, non-tender, without nodularity LYMPH:  no palpable lymphadenopathy in the cervical, axillary or inguinal LUNGS: clear to auscultation and percussion with normal breathing effort HEART: regular rate & rhythm and no murmurs and no lower extremity edema ABDOMEN:abdomen soft, non-tender and normal bowel sounds. Surgical wound has well-healed Musculoskeletal:no cyanosis of digits and no clubbing  PSYCH: alert & oriented x 3 with fluent speech NEURO: no focal motor/sensory deficits  LABORATORY DATA:  I have reviewed the data as listed CBC Latest Ref Rng 05/03/2015 04/19/2015 04/05/2015  WBC 3.9 - 10.3 10e3/uL 3.7(L) 5.1 4.6  Hemoglobin 11.6 - 15.9 g/dL 11.7 12.0 12.4  Hematocrit 34.8 - 46.6 % 35.6 37.0 38.7  Platelets 145 - 400 10e3/uL 135(L) 125(L) 132(L)    CMP Latest Ref Rng 05/03/2015 04/19/2015 04/05/2015  Glucose 70 - 140 mg/dl 134 125 152(H)  BUN 7.0 - 26.0 mg/dL 12.0 12.8 11.4  Creatinine 0.6 - 1.1 mg/dL 0.8 0.8 0.8  Sodium 136 - 145 mEq/L 140 142 141  Potassium 3.5 -  5.1 mEq/L 4.0 4.2 3.8  Chloride 96 - 112 mEq/L - - -  CO2 22 - 29 mEq/L $Remove'23 23 25  'lkLlMgn$ Calcium 8.4 - 10.4 mg/dL 9.0 10.0 9.4  Total Protein 6.4 - 8.3 g/dL 7.2 7.8 7.7  Total Bilirubin 0.20 - 1.20 mg/dL 0.28 0.34 0.48  Alkaline Phos 40 - 150 U/L 77 99 77  AST 5 - 34 U/L 22 22 32  ALT 0 - 55 U/L 18 19 32     PATHOLOGY REPORT 11/14/2014 Diagnosis(continued) 1. Colon, segmental resection for tumor, sigmoid - INVASIVE ADENOCARCINOMA, INVADING THROUGH THE MUSCULARIS PROPRIA INTO PERICOLONIC FATTY TISSUE. - ONE OF TWENTY-EIGHT LYMPH NODES, POSITIVE FOR METASTATIC CARCINOMA (1/28). - RESECTION MARGINS, NEGATIVE FOR ATYPIA OR MALIGNANCY - RESECTION MARGINS, NEGATIVE FOR ATYPIA OR MALIGNANCY. 2. Colon, resection margin (donut), colon final distal margin - BENIGN COLONIC MUCOSA, NO EVIDENCE OF MALIGNANCY. Microscopic Comment 1. COLON Specimen:  Sigmoid colon Procedure: Segmental resection Tumor site: Sigmoid colon Specimen integrity: Intact Macroscopic intactness of mesorectum: N/A Macroscopic tumor perforation: No Invasive tumor: Maximum size: 2.7 cm, gross measurement Histologic type(s): Invasive adenocarcinoma Histologic grade and differentiation: G2: moderately differentiated/low grade Type of polyp tumor arose from: Tubular adenoma Microscopic extension of invasive tumor: Invading through the muscularis propria into pericolonic fatty tissue. Lymph-Vascular invasion: Not identified Peri-neural invasion: Not identified Tumor deposit(s) (discontinuous extramural extension): N/A Resection margins: Negative Open margin: 7.1 cm Stapled margin: 5.5 cm Mesenteric margin (sigmoid and transverse): 9 cm Treatment effect (neo-adjuvant therapy): No Additional polyp(s): N/A Non-neoplastic findings: N/A Lymph nodes: number examined 28; number positive: 1 Pathologic Staging: pT3, pN1a, pMX Ancillary studies: MMR stains will be performed and an addendum report will follow. MSI testing will be performed at an outside institution and the results will be available in EPIC. (HCL:kh 11-15-14)  TUMOR MSI: stable (by PCR),  MMR: NORMAL   RADIOGRAPHIC STUDIES: I have personally reviewed the radiological images as listed and agreed with the findings in the report.  CT chest 10/25/2014 IMPRESSION: No CT findings for pulmonary metastatic disease. No mediastinal or hilar mass or adenopathy.  CT abdomen Pelvis w contrast 10/10/2014 Eccentric wall thickening along the sigmoid colon, likely corresponding to the finding on colonoscopy, worrisome for early colonic adenocarcinoma.  No findings suspicious for metastatic disease.  ASSESSMENT & PLAN:  54 year old African-American female, without significant past medical history, presents with abdominal pain and bloody bowel movement, anemia, was found to have a sigmoid adenocarcinoma,  status post complete surgical resection.   1. Sigmoid colon adenocarcinoma, G2, pT3pN1aM0, stage IIIB, MSI stable  -She is on adjuvant FOLFOX for total of 12 cycles planned. The goal of treatment is curative.  -She tolerated the first 4 cycle well with some expected side effects, but fatigued, and anorexia seems getting worse with each cycle. -Due to her thrombocytopenia and fatigue, chemo dose was reduced by 10% from cycle 5  -Her neuropathy and leg pain are getting much worse lately, despite a dose reduction on oxaliplatin and 5-FU.  I held oxaliplatin from cycle 8. -After lengthy discussion with patient and her husband, due to the concern of her complaints with oral pills, we decided to continue 5-FU infusion instead of capecitabine -Lab reviewed, she still has mild thrombus cytopenia, OK to proceed with 5-FU bolus and infusion, as same dose as last cycle.  2. body pain, secondary to chemo? -She states she has diffuse body pain after chemotherapy, she takes Tylenol and ibuprofen but not sufficient. -Continue  Oxycodone as needed. We'll weaned off  after she completes chemotherapy  3. Microcytic anemia, likely iron deficient anemia secondary to colon cancer, and chemotherapy related anemia -Her serum iron and saturation were low. Likely from her colon cancer relates.  -Her anemia improved after IV Feraheme.  -follow ferritin level.  4. Grade 2 neuropathy -Secondary to chemotherapy -continue neurontin -stop oxaliplatin from cycle 8  Plan  -cycle 9 chemotherapy today, hold oxaplatin -She will return in 2 weeks for next cycle   All questions were answered. The patient knows to call the clinic with any problems,  questions or concerns.  I spent 20 minutes counseling the patient face to face. The total time spent in the appointment was 25 minutes and more than 50% was on counseling.     Truitt Merle, MD 05/03/2015 10:12 AM

## 2015-05-03 NOTE — Telephone Encounter (Signed)
Gave and printed appt sched and avs for pt for May and June....added tx.

## 2015-05-05 ENCOUNTER — Ambulatory Visit (HOSPITAL_BASED_OUTPATIENT_CLINIC_OR_DEPARTMENT_OTHER): Payer: BC Managed Care – PPO

## 2015-05-05 VITALS — BP 100/55 | HR 80 | Temp 98.3°F

## 2015-05-05 DIAGNOSIS — C187 Malignant neoplasm of sigmoid colon: Secondary | ICD-10-CM | POA: Diagnosis not present

## 2015-05-05 DIAGNOSIS — C189 Malignant neoplasm of colon, unspecified: Secondary | ICD-10-CM

## 2015-05-05 MED ORDER — SODIUM CHLORIDE 0.9 % IJ SOLN
10.0000 mL | INTRAMUSCULAR | Status: DC | PRN
  Administered 2015-05-05: 10 mL
  Filled 2015-05-05: qty 10

## 2015-05-05 MED ORDER — HEPARIN SOD (PORK) LOCK FLUSH 100 UNIT/ML IV SOLN
500.0000 [IU] | Freq: Once | INTRAVENOUS | Status: AC | PRN
  Administered 2015-05-05: 500 [IU]
  Filled 2015-05-05: qty 5

## 2015-05-05 NOTE — Patient Instructions (Signed)
Fluorouracil, 5-FU injection What is this medicine? FLUOROURACIL, 5-FU (flure oh YOOR a sil) is a chemotherapy drug. It slows the growth of cancer cells. This medicine is used to treat many types of cancer like breast cancer, colon or rectal cancer, pancreatic cancer, and stomach cancer. This medicine may be used for other purposes; ask your health care provider or pharmacist if you have questions. COMMON BRAND NAME(S): Adrucil What should I tell my health care provider before I take this medicine? They need to know if you have any of these conditions: -blood disorders -dihydropyrimidine dehydrogenase (DPD) deficiency -infection (especially a virus infection such as chickenpox, cold sores, or herpes) -kidney disease -liver disease -malnourished, poor nutrition -recent or ongoing radiation therapy -an unusual or allergic reaction to fluorouracil, other chemotherapy, other medicines, foods, dyes, or preservatives -pregnant or trying to get pregnant -breast-feeding How should I use this medicine? This drug is given as an infusion or injection into a vein. It is administered in a hospital or clinic by a specially trained health care professional. Talk to your pediatrician regarding the use of this medicine in children. Special care may be needed. Overdosage: If you think you have taken too much of this medicine contact a poison control center or emergency room at once. NOTE: This medicine is only for you. Do not share this medicine with others. What if I miss a dose? It is important not to miss your dose. Call your doctor or health care professional if you are unable to keep an appointment. What may interact with this medicine? -allopurinol -cimetidine -dapsone -digoxin -hydroxyurea -leucovorin -levamisole -medicines for seizures like ethotoin, fosphenytoin, phenytoin -medicines to increase blood counts like filgrastim, pegfilgrastim, sargramostim -medicines that treat or prevent blood  clots like warfarin, enoxaparin, and dalteparin -methotrexate -metronidazole -pyrimethamine -some other chemotherapy drugs like busulfan, cisplatin, estramustine, vinblastine -trimethoprim -trimetrexate -vaccines Talk to your doctor or health care professional before taking any of these medicines: -acetaminophen -aspirin -ibuprofen -ketoprofen -naproxen This list may not describe all possible interactions. Give your health care provider a list of all the medicines, herbs, non-prescription drugs, or dietary supplements you use. Also tell them if you smoke, drink alcohol, or use illegal drugs. Some items may interact with your medicine. What should I watch for while using this medicine? Visit your doctor for checks on your progress. This drug may make you feel generally unwell. This is not uncommon, as chemotherapy can affect healthy cells as well as cancer cells. Report any side effects. Continue your course of treatment even though you feel ill unless your doctor tells you to stop. In some cases, you may be given additional medicines to help with side effects. Follow all directions for their use. Call your doctor or health care professional for advice if you get a fever, chills or sore throat, or other symptoms of a cold or flu. Do not treat yourself. This drug decreases your body's ability to fight infections. Try to avoid being around people who are sick. This medicine may increase your risk to bruise or bleed. Call your doctor or health care professional if you notice any unusual bleeding. Be careful brushing and flossing your teeth or using a toothpick because you may get an infection or bleed more easily. If you have any dental work done, tell your dentist you are receiving this medicine. Avoid taking products that contain aspirin, acetaminophen, ibuprofen, naproxen, or ketoprofen unless instructed by your doctor. These medicines may hide a fever. Do not become pregnant while taking this    medicine. Women should inform their doctor if they wish to become pregnant or think they might be pregnant. There is a potential for serious side effects to an unborn child. Talk to your health care professional or pharmacist for more information. Do not breast-feed an infant while taking this medicine. Men should inform their doctor if they wish to father a child. This medicine may lower sperm counts. Do not treat diarrhea with over the counter products. Contact your doctor if you have diarrhea that lasts more than 2 days or if it is severe and watery. This medicine can make you more sensitive to the sun. Keep out of the sun. If you cannot avoid being in the sun, wear protective clothing and use sunscreen. Do not use sun lamps or tanning beds/booths. What side effects may I notice from receiving this medicine? Side effects that you should report to your doctor or health care professional as soon as possible: -allergic reactions like skin rash, itching or hives, swelling of the face, lips, or tongue -low blood counts - this medicine may decrease the number of white blood cells, red blood cells and platelets. You may be at increased risk for infections and bleeding. -signs of infection - fever or chills, cough, sore throat, pain or difficulty passing urine -signs of decreased platelets or bleeding - bruising, pinpoint red spots on the skin, black, tarry stools, blood in the urine -signs of decreased red blood cells - unusually weak or tired, fainting spells, lightheadedness -breathing problems -changes in vision -chest pain -mouth sores -nausea and vomiting -pain, swelling, redness at site where injected -pain, tingling, numbness in the hands or feet -redness, swelling, or sores on hands or feet -stomach pain -unusual bleeding Side effects that usually do not require medical attention (report to your doctor or health care professional if they continue or are bothersome): -changes in finger or  toe nails -diarrhea -dry or itchy skin -hair loss -headache -loss of appetite -sensitivity of eyes to the light -stomach upset -unusually teary eyes This list may not describe all possible side effects. Call your doctor for medical advice about side effects. You may report side effects to FDA at 1-800-FDA-1088. Where should I keep my medicine? This drug is given in a hospital or clinic and will not be stored at home. NOTE: This sheet is a summary. It may not cover all possible information. If you have questions about this medicine, talk to your doctor, pharmacist, or health care provider.  2015, Elsevier/Gold Standard. (2008-04-13 13:53:16)   

## 2015-05-15 NOTE — Progress Notes (Signed)
Error

## 2015-05-16 ENCOUNTER — Other Ambulatory Visit: Payer: Self-pay | Admitting: Hematology

## 2015-05-17 ENCOUNTER — Encounter: Payer: Self-pay | Admitting: Physician Assistant

## 2015-05-17 ENCOUNTER — Ambulatory Visit (HOSPITAL_BASED_OUTPATIENT_CLINIC_OR_DEPARTMENT_OTHER): Payer: BC Managed Care – PPO

## 2015-05-17 ENCOUNTER — Ambulatory Visit: Payer: BC Managed Care – PPO | Admitting: Nutrition

## 2015-05-17 ENCOUNTER — Telehealth: Payer: Self-pay | Admitting: *Deleted

## 2015-05-17 ENCOUNTER — Telehealth: Payer: Self-pay | Admitting: Hematology

## 2015-05-17 ENCOUNTER — Other Ambulatory Visit (HOSPITAL_BASED_OUTPATIENT_CLINIC_OR_DEPARTMENT_OTHER): Payer: BC Managed Care – PPO

## 2015-05-17 ENCOUNTER — Ambulatory Visit (HOSPITAL_BASED_OUTPATIENT_CLINIC_OR_DEPARTMENT_OTHER): Payer: BC Managed Care – PPO | Admitting: Physician Assistant

## 2015-05-17 VITALS — BP 123/88 | HR 81 | Temp 98.1°F | Resp 19 | Ht 63.0 in | Wt 254.2 lb

## 2015-05-17 DIAGNOSIS — C187 Malignant neoplasm of sigmoid colon: Secondary | ICD-10-CM

## 2015-05-17 DIAGNOSIS — E669 Obesity, unspecified: Secondary | ICD-10-CM

## 2015-05-17 DIAGNOSIS — C189 Malignant neoplasm of colon, unspecified: Secondary | ICD-10-CM

## 2015-05-17 DIAGNOSIS — Z5111 Encounter for antineoplastic chemotherapy: Secondary | ICD-10-CM | POA: Diagnosis not present

## 2015-05-17 LAB — CBC & DIFF AND RETIC
BASO%: 0.6 % (ref 0.0–2.0)
Basophils Absolute: 0 10*3/uL (ref 0.0–0.1)
EOS%: 1.9 % (ref 0.0–7.0)
Eosinophils Absolute: 0.1 10*3/uL (ref 0.0–0.5)
HCT: 40 % (ref 34.8–46.6)
HGB: 13.2 g/dL (ref 11.6–15.9)
Immature Retic Fract: 17.2 % — ABNORMAL HIGH (ref 1.60–10.00)
LYMPH%: 42.7 % (ref 14.0–49.7)
MCH: 28.1 pg (ref 25.1–34.0)
MCHC: 33 g/dL (ref 31.5–36.0)
MCV: 85.1 fL (ref 79.5–101.0)
MONO#: 0.3 10*3/uL (ref 0.1–0.9)
MONO%: 7.2 % (ref 0.0–14.0)
NEUT#: 2.3 10*3/uL (ref 1.5–6.5)
NEUT%: 47.6 % (ref 38.4–76.8)
Platelets: 197 10*3/uL (ref 145–400)
RBC: 4.7 10*6/uL (ref 3.70–5.45)
RDW: 17.7 % — ABNORMAL HIGH (ref 11.2–14.5)
Retic %: 1.67 % (ref 0.70–2.10)
Retic Ct Abs: 78.49 10*3/uL (ref 33.70–90.70)
WBC: 4.7 10*3/uL (ref 3.9–10.3)
lymph#: 2 10*3/uL (ref 0.9–3.3)
nRBC: 0 % (ref 0–0)

## 2015-05-17 LAB — COMPREHENSIVE METABOLIC PANEL (CC13)
ALT: 16 U/L (ref 0–55)
AST: 21 U/L (ref 5–34)
Albumin: 3.9 g/dL (ref 3.5–5.0)
Alkaline Phosphatase: 73 U/L (ref 40–150)
Anion Gap: 11 mEq/L (ref 3–11)
BUN: 11.4 mg/dL (ref 7.0–26.0)
CO2: 21 mEq/L — ABNORMAL LOW (ref 22–29)
Calcium: 9.6 mg/dL (ref 8.4–10.4)
Chloride: 108 mEq/L (ref 98–109)
Creatinine: 0.9 mg/dL (ref 0.6–1.1)
EGFR: 83 mL/min/{1.73_m2} — ABNORMAL LOW (ref >90)
Glucose: 144 mg/dl — ABNORMAL HIGH (ref 70–140)
Potassium: 4.3 mEq/L (ref 3.5–5.1)
Sodium: 140 mEq/L (ref 136–145)
Total Bilirubin: 0.47 mg/dL (ref 0.20–1.20)
Total Protein: 7.8 g/dL (ref 6.4–8.3)

## 2015-05-17 MED ORDER — FLUOROURACIL CHEMO INJECTION 2.5 GM/50ML
400.0000 mg/m2 | Freq: Once | INTRAVENOUS | Status: AC
  Administered 2015-05-17: 900 mg via INTRAVENOUS
  Filled 2015-05-17: qty 18

## 2015-05-17 MED ORDER — SODIUM CHLORIDE 0.9 % IV SOLN
Freq: Once | INTRAVENOUS | Status: AC
  Administered 2015-05-17: 12:00:00 via INTRAVENOUS

## 2015-05-17 MED ORDER — DEXTROSE 5 % IV SOLN: 360.0000 mg/m2 | Freq: Once | INTRAVENOUS | Status: DC

## 2015-05-17 MED ORDER — SODIUM CHLORIDE 0.9 % IV SOLN
Freq: Once | INTRAVENOUS | Status: AC
  Administered 2015-05-17: 12:00:00 via INTRAVENOUS
  Filled 2015-05-17: qty 4

## 2015-05-17 MED ORDER — OXYCODONE HCL 5 MG PO TABS: 5.0000 mg | ORAL_TABLET | Freq: Four times a day (QID) | ORAL | Status: DC | PRN

## 2015-05-17 MED ORDER — ACETAMINOPHEN 325 MG PO TABS
ORAL_TABLET | ORAL | Status: AC
  Filled 2015-05-17: qty 2

## 2015-05-17 MED ORDER — ACETAMINOPHEN 325 MG PO TABS
650.0000 mg | ORAL_TABLET | Freq: Once | ORAL | Status: AC
  Administered 2015-05-17: 650 mg via ORAL

## 2015-05-17 MED ORDER — LEUCOVORIN CALCIUM INJECTION 350 MG
400.0000 mg/m2 | Freq: Once | INTRAMUSCULAR | Status: AC
  Administered 2015-05-17: 888 mg via INTRAVENOUS
  Filled 2015-05-17: qty 44.4

## 2015-05-17 MED ORDER — SODIUM CHLORIDE 0.9 % IV SOLN
2400.0000 mg/m2 | INTRAVENOUS | Status: DC
  Administered 2015-05-17: 5350 mg via INTRAVENOUS
  Filled 2015-05-17: qty 107

## 2015-05-17 NOTE — Telephone Encounter (Signed)
Pt confirmed labs/ov per 05/25 POF, gave pt AVS and Calendar.... KJ, gave pt barium

## 2015-05-17 NOTE — Patient Instructions (Signed)
[facility] Discharge Instructions for Patients Receiving Chemotherapy  Today you received the following chemotherapy agents:   Leucovorin and 5FU.  To help prevent nausea and vomiting after your treatment, we encourage you to take your nausea medication as ordered per MD.   If you develop nausea and vomiting that is not controlled by your nausea medication, call the clinic.   BELOW ARE SYMPTOMS THAT SHOULD BE REPORTED IMMEDIATELY:  *FEVER GREATER THAN 100.5 F  *CHILLS WITH OR WITHOUT FEVER  NAUSEA AND VOMITING THAT IS NOT CONTROLLED WITH YOUR NAUSEA MEDICATION  *UNUSUAL SHORTNESS OF BREATH  *UNUSUAL BRUISING OR BLEEDING  TENDERNESS IN MOUTH AND THROAT WITH OR WITHOUT PRESENCE OF ULCERS  *URINARY PROBLEMS  *BOWEL PROBLEMS  UNUSUAL RASH Items with * indicate a potential emergency and should be followed up as soon as possible.  Feel free to call the clinic you have any questions or concerns. The clinic phone number is [phone].  Please show the CHEMO ALERT CARD at check-in to the Emergency Department and triage nurse.   

## 2015-05-17 NOTE — Progress Notes (Signed)
Pt requesting Tylenol for a headache.  Dr. Burr Medico notified and order received to give pt Tylenol 650 mg PO now.  Tylenol given at 1337.  1415-pt states that headache is "slightly better"

## 2015-05-17 NOTE — Progress Notes (Signed)
Jenny [name]  [phone]) [phone] Fax:([phone]) [name] Note   Patient Care Team: Jenny Covert, MD as PCP - General Jenny Craver, MD as Consulting Physician (Gastroenterology) Jenny Ruff, MD as Consulting Physician (General Surgery) Jenny Merle, MD as Consulting Physician (Hematology) 05/17/2015  CHIEF COMPLAINTS Follow up colon cancer   DIAGNOSIS:  Colon cancer   Staging form: Colon and Rectum, AJCC 7th Edition     Clinical: Stage IIIB (T3, N1a, M0) - Signed by Jenny Merle, MD on 12/28/2014     Colon cancer   10/10/2014 Imaging CT abdomen and pelvis: Eccentric wall thickening along the sigmoid colon, worrisome for early colonic adenocarcinoma. No findings suspicious for metastatic disease. CT chest (-)      11/14/2014 Initial Diagnosis Colon cancer   11/14/2014 Pathologic Stage pT3pN1pMx, G2, tumor invading through the muscular propria into pericolonic fatty tissue.  LVI (-), PNI (-). 1/28 node positive, margins negative.    11/14/2014 Surgery sigmoid colon segmental resection, margins (-).    12/28/2014 -  Adjuvant Chemotherapy mFOLFOX6, 10% dose reduction due to neuropathy and cytopenia from cycle 5, oxaliplatin held from cycle 8 due to leg pain and neuropathy.     HISTORY OF INITIAL PRESENTING ILLNESS:  Jenny [name] 54 y.o. female without a significant past medical history, who is referred by her surgeon Dr. Marcello [name] to discuss adjuvant chemotherapy for her resected colon cancer.  She presented with abdominal pain for 8-9 month, which was related to position. She noticed bloody stool in Aug 2015. She was seen by PCP, and was referred to GI Dr. Collene [name], and underwent colonoscopy which showed a mass at sigmoid colon (per patient, I do not have the report). Biopsy showed adenocarcinoma. She was then referred to surgeon Dr. Marcello [name] and underwent sigmoid colon segmentectomy on 11/14/2014.   TREATMENT: mFOLFOX6  started on 12/28/2014, 10% dose reduction  due to fatigue and thrombocytopenia from cycle 5, oxaliplatin held from cycle 8 due to leg pain and neuropathy  INTERIM HISTORY: Mrs. Jenny [name] returns for follow-up and 10th cycle chemo. She presents with her husband and 2 other family members today. Today she feels well. She continues to have moderate back pain and mild to moderate neuropathy on fingers and toes.  She is still able to function well at home, Fine motor function of fingers is slightly impacted, but overall still preserved. She continues to have a good appetite.     MEDICAL HISTORY:  Past Medical History  Diagnosis Date  . Enlarged thyroid   . Anemia   . Cancer     colon cancer     SURGICAL HISTORY: Past Surgical History  Procedure Laterality Date  . Tonsillectomy    . Cervical spine surgery  06/17/2012    C5-C7 ACDF  . Cesarean section    . Partial hysterectomy    . Portacath placement Left 12/22/2014    Procedure: INSERTION PORT-A-CATH LEFT SUBCLAVIAN;  Surgeon: Jenny Ruff, MD;  Location: [facility];  Service: General;  Laterality: Left;    SOCIAL HISTORY: History   Social History  . Marital Status: Married    Spouse Name: N/A    Number of Children: 2  . Years of Education: N/A   Occupational History  . Not on file.   Social History Main Topics  . Smoking status: Never Smoker   . Smokeless tobacco: Never Used  . Alcohol Use: No  . Drug Use: No  . Sexual Activity: Not on file  P2G2, one child was murdered. She is a Paramedic.   FAMILY HISTORY: Family History  Problem Relation Age of Onset  . Colon cancer Neg Hx   . Liver Cancer Brother        . Hypertension Mother     ALLERGIES:  is allergic to other and shellfish allergy.  MEDICATIONS:  Current Outpatient Prescriptions  Medication Sig Dispense Refill  . capecitabine (XELODA) 500 MG tablet Take 5 tablets (2,500 mg total) by mouth 2 (two) times daily after a meal. 140 tablet 0  . diphenhydrAMINE (BENADRYL) 12.5 MG/5ML liquid Take  12.5-25 mg by mouth every 4 (four) hours as needed for itching or allergies.    Marland Kitchen gabapentin (NEURONTIN) 100 MG capsule Take 1 capsule (100 mg total) by mouth 3 (three) times daily. 90 capsule 1  . Hydrocodone-Acetaminophen (VICODIN) 5-300 MG TABS Take 1 tablet by mouth every 21 ( twenty-one) days. 5 each 0  . hydrocortisone cream 0.5 % Apply 1 application topically 2 (two) times daily as needed for itching.    . lidocaine-prilocaine (EMLA) cream Apply 1 application topically as needed. 30 g 2  . ondansetron (ZOFRAN) 8 MG tablet Take 1 by mouth twice a day for 2 days after chemotherapy and then twice daily as needed 30 tablet 1  . oxyCODONE (OXY IR/ROXICODONE) 5 MG immediate release tablet Take 1 tablet (5 mg total) by mouth every 6 (six) hours as needed for severe pain. 60 tablet 0  . triamcinolone (KENALOG) 0.025 % ointment Apply 1 application topically 2 (two) times daily. 30 g 1   No current facility-administered medications for this visit.   Facility-Administered Medications Ordered in Other Visits  Medication Dose Route Frequency Provider Last Rate Last Dose  . fluorouracil (ADRUCIL) 5,350 mg in sodium chloride 0.9 % 143 mL chemo infusion  2,400 mg/m2 (Treatment Plan Actual) Intravenous 1 day or 1 dose Jenny Merle, MD   5,350 mg at 05/17/15 1455    REVIEW OF SYSTEMS:   Constitutional: Denies fevers, chills or abnormal night sweats Eyes: Denies blurriness of vision, double vision or watery eyes Ears, nose, mouth, throat, and face: Denies mucositis or sore throat Respiratory: Denies cough, dyspnea or wheezes Cardiovascular: Denies palpitation, chest discomfort or lower extremity swelling Gastrointestinal:  Denies nausea, heartburn or change in bowel habits Skin: Denies abnormal skin rashes Lymphatics: Denies new lymphadenopathy or easy bruising Neurological:Denies numbness, tingling or new weaknesses Behavioral/Psych: Mood is stable, no new changes  All other systems were reviewed with  the patient and are negative.  PHYSICAL EXAMINATION: ECOG PERFORMANCE STATUS: 2 Vital sign was reviewed, normal.  GENERAL:alert, no distress and comfortable SKIN: skin color, texture, turgor are normal, no rashes or significant lesions EYES: normal, conjunctiva are pink and non-injected, sclera clear OROPHARYNX:no exudate, no erythema and lips, buccal mucosa, and tongue normal  NECK: supple, thyroid normal size, non-tender, without nodularity LYMPH:  no palpable lymphadenopathy in the cervical, axillary or inguinal LUNGS: clear to auscultation and percussion with normal breathing effort HEART: regular rate & rhythm and no murmurs and no lower extremity edema ABDOMEN:abdomen soft, non-tender and normal bowel sounds. Surgical wound has well-healed Musculoskeletal:no cyanosis of digits and no clubbing  PSYCH: alert & oriented x 3 with fluent speech NEURO: no focal motor/sensory deficits  LABORATORY DATA:  I have reviewed the data as listed CBC Latest Ref Rng 05/17/2015 05/03/2015 04/19/2015  WBC 3.9 - 10.3 10e3/uL 4.7 3.7(L) 5.1  Hemoglobin 11.6 - 15.9 g/dL 13.2 11.7 12.0  Hematocrit 34.8 -  46.6 % 40.0 35.6 37.0  Platelets 145 - 400 10e3/uL 197 135(L) 125(L)    CMP Latest Ref Rng 05/17/2015 05/03/2015 04/19/2015  Glucose 70 - 140 mg/dl 144(H) 134 125  BUN 7.0 - 26.0 mg/dL 11.4 12.0 12.8  Creatinine 0.6 - 1.1 mg/dL 0.9 0.8 0.8  Sodium 136 - 145 mEq/L 140 140 142  Potassium 3.5 - 5.1 mEq/L 4.3 4.0 4.2  Chloride 96 - 112 mEq/L - - -  CO2 22 - 29 mEq/L 21(L) 23 23  Calcium 8.4 - 10.4 mg/dL 9.6 9.0 10.0  Total Protein 6.4 - 8.3 g/dL 7.8 7.2 7.8  Total Bilirubin 0.20 - 1.20 mg/dL 0.47 0.28 0.34  Alkaline Phos 40 - 150 U/L 73 77 99  AST 5 - 34 U/L _0 ALT 0 - 55 U/L _1 PATHOLOGY REPORT 11/14/2014 Diagnosis(continued) 1. Colon, segmental resection for tumor, sigmoid - INVASIVE ADENOCARCINOMA, INVADING THROUGH THE MUSCULARIS PROPRIA INTO PERICOLONIC FATTY TISSUE. - ONE  OF TWENTY-EIGHT LYMPH NODES, POSITIVE FOR METASTATIC CARCINOMA (1/28). - RESECTION MARGINS, NEGATIVE FOR ATYPIA OR MALIGNANCY - RESECTION MARGINS, NEGATIVE FOR ATYPIA OR MALIGNANCY. 2. Colon, resection margin (donut), colon final distal margin - BENIGN COLONIC MUCOSA, NO EVIDENCE OF MALIGNANCY. Microscopic Comment 1. COLON Specimen: Sigmoid colon Procedure: Segmental resection Tumor site: Sigmoid colon Specimen integrity: Intact Macroscopic intactness of mesorectum: N/A Macroscopic tumor perforation: No Invasive tumor: Maximum size: 2.7 cm, gross measurement Histologic type(s): Invasive adenocarcinoma Histologic grade and differentiation: G2: moderately differentiated/low grade Type of polyp tumor arose from: Tubular adenoma Microscopic extension of invasive tumor: Invading through the muscularis propria into pericolonic fatty tissue. Lymph-Vascular invasion: Not identified Peri-neural invasion: Not identified Tumor deposit(s) (discontinuous extramural extension): N/A Resection margins: Negative Open margin: 7.1 cm Stapled margin: 5.5 cm Mesenteric margin (sigmoid and transverse): 9 cm Treatment effect (neo-adjuvant therapy): No Additional polyp(s): N/A Non-neoplastic findings: N/A Lymph nodes: number examined 28; number positive: 1 Pathologic Staging: pT3, pN1a, pMX Ancillary studies: MMR stains will be performed and an addendum report will follow. MSI testing will be performed at an outside institution and the results will be available in EPIC. (HCL:kh 11-15-14)  TUMOR MSI: stable (by PCR),  MMR: NORMAL   RADIOGRAPHIC STUDIES: I have personally reviewed the radiological images as listed and agreed with the findings in the report.  CT chest 10/25/2014 IMPRESSION: No CT findings for pulmonary metastatic disease. No mediastinal or hilar mass or adenopathy.  CT abdomen Pelvis w contrast 10/10/2014 Eccentric wall thickening along the sigmoid colon, likely corresponding to  the finding on colonoscopy, worrisome for early colonic adenocarcinoma.  No findings suspicious for metastatic disease.  ASSESSMENT & PLAN:  54 year old African-American female, without significant past medical history, presents with abdominal pain and bloody bowel movement, anemia, was found to have a sigmoid adenocarcinoma, status post complete surgical resection.   1. Sigmoid colon adenocarcinoma, G2, pT3pN1aM0, stage IIIB, MSI stable  -She is on adjuvant FOLFOX for total of 12 cycles planned. The goal of treatment is curative.  -She tolerated the first 4 cycle well with some expected side effects, but fatigued, and anorexia seems getting worse with each cycle. -Due to her thrombocytopenia and fatigue, chemo dose was reduced by 10% from cycle 5  -Her neuropathy and leg pain are stable. Oxaliplatin has been held from cycle 8. -After lengthy discussion with patient and her husband, due to the concern of her complaints with oral pills, we decided to continue 5-FU infusion instead of capecitabine. She  will stop with cycle #10. -Lab reviewed, and were within normal limits OK to proceed with 5-FU bolus and infusion, as same dose as last cycle.  2. body pain, secondary to chemo? -She states she has diffuse body pain after chemotherapy, she takes Tylenol and ibuprofen but not sufficient. -Continue  Oxycodone as needed. We'll weaned off after she completes chemotherapy She was given a refill of her oxycodone tablets today  3. Microcytic anemia, likely iron deficient anemia secondary to colon cancer, and chemotherapy related anemia -Her serum iron and saturation were low. Likely from her colon cancer relates.  -Her anemia improved after IV Feraheme.  -follow ferritin level.  4. Grade 2 neuropathy -Secondary to chemotherapy -continue neurontin -stop oxaliplatin from cycle 8  Plan  -cycle 10 chemotherapy today, hold oxaplatin -She will return in 2 months with a restaging CT scan of her hest  abdomen and pelvis with contrast to re-evaluate her disease.  All questions were answered. The patient knows to call the clinic with any problems,  questions or concerns.  Patient was discussed with and also seen by Dr. Burr Medico.    Carlton Adam, PA-C 05/17/2015 4:41 PM

## 2015-05-17 NOTE — Progress Notes (Signed)
54 year old female diagnosed with colon cancer.  She is a patient of Dr. Burr Medico.  Past medical history includes enlarged thyroid and anemia.  Medications include Xeloda and Zofran.  Labs were reviewed.  Height: 63 inches. Weight: 254 pounds. Usual body weight: 245 pounds. BMI: 45.04.  Patient received chemotherapy for resected colon cancer. Nutrition consult entered for education on healthy diet. Patient is receiving her last chemotherapy today. Patient drinks lactate milk.  Nutrition diagnosis: Food and nutrition related knowledge deficit related to obesity/overweight as evidenced by BMI 45.04.  Intervention: Patient educated on healthy plant-based diet with controlled calories. Recommended patient limit red and processed meats. Reviewed importance of increasing vegetables and whole fruits and whole grains. Encouraged patient to make gradual changes over time.   Fact sheets were provided.  Questions were answered and teach back method was used.  Monitoring, evaluation, goals: Patient will tolerate a healthy plant-based diet to achieve healthy weight and reduce recurrence for colon cancer.  No follow-up is needed.  Patient has my contact information for questions.  **Disclaimer: This note was dictated with voice recognition software. Similar sounding words can inadvertently be transcribed and this note may contain transcription errors which may not have been corrected upon publication of note.**

## 2015-05-19 ENCOUNTER — Ambulatory Visit (HOSPITAL_BASED_OUTPATIENT_CLINIC_OR_DEPARTMENT_OTHER): Payer: BC Managed Care – PPO

## 2015-05-19 VITALS — BP 98/61 | HR 70 | Temp 98.5°F

## 2015-05-19 DIAGNOSIS — C187 Malignant neoplasm of sigmoid colon: Secondary | ICD-10-CM | POA: Diagnosis not present

## 2015-05-19 DIAGNOSIS — C189 Malignant neoplasm of colon, unspecified: Secondary | ICD-10-CM

## 2015-05-19 MED ORDER — HEPARIN SOD (PORK) LOCK FLUSH 100 UNIT/ML IV SOLN
500.0000 [IU] | Freq: Once | INTRAVENOUS | Status: AC | PRN
  Administered 2015-05-19: 500 [IU]
  Filled 2015-05-19: qty 5

## 2015-05-19 MED ORDER — SODIUM CHLORIDE 0.9 % IJ SOLN
10.0000 mL | INTRAMUSCULAR | Status: DC | PRN
  Administered 2015-05-19: 10 mL
  Filled 2015-05-19: qty 10

## 2015-05-19 NOTE — Patient Instructions (Signed)
Fluorouracil, 5-FU injection What is this medicine? FLUOROURACIL, 5-FU (flure oh YOOR a sil) is a chemotherapy drug. It slows the growth of cancer cells. This medicine is used to treat many types of cancer like breast cancer, colon or rectal cancer, pancreatic cancer, and stomach cancer. This medicine may be used for other purposes; ask your health care provider or pharmacist if you have questions. COMMON BRAND NAME(S): Adrucil What should I tell my health care provider before I take this medicine? They need to know if you have any of these conditions: -blood disorders -dihydropyrimidine dehydrogenase (DPD) deficiency -infection (especially a virus infection such as chickenpox, cold sores, or herpes) -kidney disease -liver disease -malnourished, poor nutrition -recent or ongoing radiation therapy -an unusual or allergic reaction to fluorouracil, other chemotherapy, other medicines, foods, dyes, or preservatives -pregnant or trying to get pregnant -breast-feeding How should I use this medicine? This drug is given as an infusion or injection into a vein. It is administered in a hospital or clinic by a specially trained health care professional. Talk to your pediatrician regarding the use of this medicine in children. Special care may be needed. Overdosage: If you think you have taken too much of this medicine contact a poison control center or emergency room at once. NOTE: This medicine is only for you. Do not share this medicine with others. What if I miss a dose? It is important not to miss your dose. Call your doctor or health care professional if you are unable to keep an appointment. What may interact with this medicine? -allopurinol -cimetidine -dapsone -digoxin -hydroxyurea -leucovorin -levamisole -medicines for seizures like ethotoin, fosphenytoin, phenytoin -medicines to increase blood counts like filgrastim, pegfilgrastim, sargramostim -medicines that treat or prevent blood  clots like warfarin, enoxaparin, and dalteparin -methotrexate -metronidazole -pyrimethamine -some other chemotherapy drugs like busulfan, cisplatin, estramustine, vinblastine -trimethoprim -trimetrexate -vaccines Talk to your doctor or health care professional before taking any of these medicines: -acetaminophen -aspirin -ibuprofen -ketoprofen -naproxen This list may not describe all possible interactions. Give your health care provider a list of all the medicines, herbs, non-prescription drugs, or dietary supplements you use. Also tell them if you smoke, drink alcohol, or use illegal drugs. Some items may interact with your medicine. What should I watch for while using this medicine? Visit your doctor for checks on your progress. This drug may make you feel generally unwell. This is not uncommon, as chemotherapy can affect healthy cells as well as cancer cells. Report any side effects. Continue your course of treatment even though you feel ill unless your doctor tells you to stop. In some cases, you may be given additional medicines to help with side effects. Follow all directions for their use. Call your doctor or health care professional for advice if you get a fever, chills or sore throat, or other symptoms of a cold or flu. Do not treat yourself. This drug decreases your body's ability to fight infections. Try to avoid being around people who are sick. This medicine may increase your risk to bruise or bleed. Call your doctor or health care professional if you notice any unusual bleeding. Be careful brushing and flossing your teeth or using a toothpick because you may get an infection or bleed more easily. If you have any dental work done, tell your dentist you are receiving this medicine. Avoid taking products that contain aspirin, acetaminophen, ibuprofen, naproxen, or ketoprofen unless instructed by your doctor. These medicines may hide a fever. Do not become pregnant while taking this    medicine. Women should inform their doctor if they wish to become pregnant or think they might be pregnant. There is a potential for serious side effects to an unborn child. Talk to your health care professional or pharmacist for more information. Do not breast-feed an infant while taking this medicine. Men should inform their doctor if they wish to father a child. This medicine may lower sperm counts. Do not treat diarrhea with over the counter products. Contact your doctor if you have diarrhea that lasts more than 2 days or if it is severe and watery. This medicine can make you more sensitive to the sun. Keep out of the sun. If you cannot avoid being in the sun, wear protective clothing and use sunscreen. Do not use sun lamps or tanning beds/booths. What side effects may I notice from receiving this medicine? Side effects that you should report to your doctor or health care professional as soon as possible: -allergic reactions like skin rash, itching or hives, swelling of the face, lips, or tongue -low blood counts - this medicine may decrease the number of white blood cells, red blood cells and platelets. You may be at increased risk for infections and bleeding. -signs of infection - fever or chills, cough, sore throat, pain or difficulty passing urine -signs of decreased platelets or bleeding - bruising, pinpoint red spots on the skin, black, tarry stools, blood in the urine -signs of decreased red blood cells - unusually weak or tired, fainting spells, lightheadedness -breathing problems -changes in vision -chest pain -mouth sores -nausea and vomiting -pain, swelling, redness at site where injected -pain, tingling, numbness in the hands or feet -redness, swelling, or sores on hands or feet -stomach pain -unusual bleeding Side effects that usually do not require medical attention (report to your doctor or health care professional if they continue or are bothersome): -changes in finger or  toe nails -diarrhea -dry or itchy skin -hair loss -headache -loss of appetite -sensitivity of eyes to the light -stomach upset -unusually teary eyes This list may not describe all possible side effects. Call your doctor for medical advice about side effects. You may report side effects to FDA at 1-800-FDA-1088. Where should I keep my medicine? This drug is given in a hospital or clinic and will not be stored at home. NOTE: This sheet is a summary. It may not cover all possible information. If you have questions about this medicine, talk to your doctor, pharmacist, or health care provider.  2015, Elsevier/Gold Standard. (2008-04-13 13:53:16)   

## 2015-05-21 NOTE — Patient Instructions (Signed)
Follow up in 2 months with a restaging CT scan of your chest, abdomen and pelvis to re-evaluate your disease 

## 2015-05-31 ENCOUNTER — Ambulatory Visit: Payer: BC Managed Care – PPO

## 2015-05-31 ENCOUNTER — Other Ambulatory Visit: Payer: BC Managed Care – PPO

## 2015-06-14 ENCOUNTER — Other Ambulatory Visit: Payer: BC Managed Care – PPO

## 2015-06-14 ENCOUNTER — Ambulatory Visit: Payer: BC Managed Care – PPO

## 2015-06-29 ENCOUNTER — Telehealth: Payer: Self-pay | Admitting: Hematology

## 2015-06-29 NOTE — Telephone Encounter (Signed)
pt cld to r/s CT scan-gave pt # to call to r/s with [facility]

## 2015-07-17 ENCOUNTER — Encounter (HOSPITAL_COMMUNITY): Payer: Self-pay

## 2015-07-17 ENCOUNTER — Ambulatory Visit (HOSPITAL_COMMUNITY)
Admission: RE | Admit: 2015-07-17 | Discharge: 2015-07-17 | Disposition: A | Discharge status: Home / Self Care | Payer: BC Managed Care – PPO | Source: Ambulatory Visit | Attending: Physician Assistant | Admitting: Physician Assistant

## 2015-07-17 ENCOUNTER — Other Ambulatory Visit (HOSPITAL_BASED_OUTPATIENT_CLINIC_OR_DEPARTMENT_OTHER): Payer: BC Managed Care – PPO

## 2015-07-17 DIAGNOSIS — C189 Malignant neoplasm of colon, unspecified: Secondary | ICD-10-CM

## 2015-07-17 DIAGNOSIS — Z09 Encounter for follow-up examination after completed treatment for conditions other than malignant neoplasm: Secondary | ICD-10-CM | POA: Insufficient documentation

## 2015-07-17 DIAGNOSIS — C187 Malignant neoplasm of sigmoid colon: Secondary | ICD-10-CM

## 2015-07-17 LAB — CBC WITH DIFFERENTIAL/PLATELET
BASO%: 0.4 % (ref 0.0–2.0)
Basophils Absolute: 0 10*3/uL (ref 0.0–0.1)
EOS%: 1.9 % (ref 0.0–7.0)
Eosinophils Absolute: 0.1 10*3/uL (ref 0.0–0.5)
HCT: 36.2 % (ref 34.8–46.6)
HGB: 12.2 g/dL (ref 11.6–15.9)
LYMPH%: 41.3 % (ref 14.0–49.7)
MCH: 28.7 pg (ref 25.1–34.0)
MCHC: 33.7 g/dL (ref 31.5–36.0)
MCV: 85.2 fL (ref 79.5–101.0)
MONO#: 0.3 10*3/uL (ref 0.1–0.9)
MONO%: 4.8 % (ref 0.0–14.0)
NEUT#: 2.9 10*3/uL (ref 1.5–6.5)
NEUT%: 51.6 % (ref 38.4–76.8)
Platelets: 207 10*3/uL (ref 145–400)
RBC: 4.25 10*6/uL (ref 3.70–5.45)
RDW: 15.7 % — ABNORMAL HIGH (ref 11.2–14.5)
WBC: 5.7 10*3/uL (ref 3.9–10.3)
lymph#: 2.3 10*3/uL (ref 0.9–3.3)

## 2015-07-17 LAB — COMPREHENSIVE METABOLIC PANEL (CC13)
ALT: 18 U/L (ref 0–55)
AST: 18 U/L (ref 5–34)
Albumin: 3.7 g/dL (ref 3.5–5.0)
Alkaline Phosphatase: 56 U/L (ref 40–150)
Anion Gap: 9 mEq/L (ref 3–11)
BUN: 11.6 mg/dL (ref 7.0–26.0)
CO2: 22 mEq/L (ref 22–29)
Calcium: 9.2 mg/dL (ref 8.4–10.4)
Chloride: 108 mEq/L (ref 98–109)
Creatinine: 0.7 mg/dL (ref 0.6–1.1)
EGFR: >90 mL/min/{1.73_m2} (ref >90)
Glucose: 154 mg/dl — ABNORMAL HIGH (ref 70–140)
Potassium: 4 mEq/L (ref 3.5–5.1)
Sodium: 138 mEq/L (ref 136–145)
Total Bilirubin: 0.63 mg/dL (ref 0.20–1.20)
Total Protein: 6.9 g/dL (ref 6.4–8.3)

## 2015-07-17 LAB — CEA: CEA: <0.5 ng/mL (ref 0.0–5.0)

## 2015-07-17 MED ORDER — IOHEXOL 300 MG/ML  SOLN
100.0000 mL | Freq: Once | INTRAMUSCULAR | Status: AC | PRN
  Administered 2015-07-17: 100 mL via INTRAVENOUS

## 2015-07-18 ENCOUNTER — Ambulatory Visit (HOSPITAL_BASED_OUTPATIENT_CLINIC_OR_DEPARTMENT_OTHER): Payer: BC Managed Care – PPO | Admitting: Hematology

## 2015-07-18 ENCOUNTER — Telehealth: Payer: Self-pay | Admitting: Hematology

## 2015-07-18 ENCOUNTER — Encounter: Payer: Self-pay | Admitting: Hematology

## 2015-07-18 VITALS — BP 151/88 | HR 81 | Temp 98.5°F | Resp 17 | Ht 63.0 in | Wt 272.4 lb

## 2015-07-18 DIAGNOSIS — G620 Drug-induced polyneuropathy: Secondary | ICD-10-CM

## 2015-07-18 DIAGNOSIS — G893 Neoplasm related pain (acute) (chronic): Secondary | ICD-10-CM | POA: Diagnosis not present

## 2015-07-18 DIAGNOSIS — C189 Malignant neoplasm of colon, unspecified: Secondary | ICD-10-CM

## 2015-07-18 DIAGNOSIS — C187 Malignant neoplasm of sigmoid colon: Secondary | ICD-10-CM

## 2015-07-18 MED ORDER — GABAPENTIN 300 MG PO CAPS: 300.0000 mg | ORAL_CAPSULE | Freq: Three times a day (TID) | ORAL | Status: DC

## 2015-07-18 NOTE — Telephone Encounter (Signed)
S.w. Pt and advised on OCT appt... msg to gretchen to sched appt

## 2015-07-18 NOTE — Progress Notes (Signed)
[place]  [phone] Fax:([phone]) [place] Note   Patient Care Team: Lind Covert, MD as PCP - General Juanita Craver, MD as Consulting Physician (Gastroenterology) [name], MD as Consulting Physician (General Surgery) Truitt Merle, MD as Consulting Physician (Hematology) 07/18/2015  CHIEF COMPLAINTS Follow up colon cancer   DIAGNOSIS:  Colon cancer   Staging form: Colon and Rectum, AJCC 7th Edition     Clinical: Stage IIIB (T3, N1a, M0) - Signed by Truitt Merle, MD on 12/28/2014     Colon cancer   10/10/2014 Imaging CT abdomen and pelvis: Eccentric wall thickening along the sigmoid colon, worrisome for early colonic adenocarcinoma. No findings suspicious for metastatic disease. CT chest (-)      11/14/2014 Initial Diagnosis Colon cancer   11/14/2014 Pathologic Stage pT3pN1pMx, G2, tumor invading through the muscular propria into pericolonic fatty tissue.  LVI (-), PNI (-). 1/28 node positive, margins negative.    11/14/2014 Surgery sigmoid colon segmental resection, margins (-).    12/28/2014 - 05/17/2015 Adjuvant Chemotherapy [id number], 10% dose reduction due to neuropathy and cytopenia from cycle 5, oxaliplatin held from cycle 8 due to leg pain and neuropathy. chemo stopped after cycle 10 due to her tolerance issue.     HISTORY OF INITIAL PRESENTING ILLNESS:  Jenny [name] 54 y.o. female without a significant past medical history, who is referred by her surgeon Dr. Marcello Moores to discuss adjuvant chemotherapy for her resected colon cancer.  She presented with abdominal pain for 8-9 month, which was related to position. She noticed bloody stool in Aug 2015. She was seen by PCP, and was referred to GI Dr. Collene Mares, and underwent colonoscopy which showed a mass at sigmoid colon (per patient, I do not have the report). Biopsy showed adenocarcinoma. She was then referred to surgeon Dr. Marcello Moores and underwent sigmoid colon segmentectomy on  11/14/2014.   TREATMENT: observation  INTERIM HISTORY: Jenny [name] returns for follow-up. Her last chemo was 2 month ago. She is slightly better, but still complains significant leg pain, tingling and numbness on her feet and fingers. She is taking oxycodone twice a day. She is still quite fatigued, not very active at home, she wants to exercise but not able to due to her symptoms. She has good appetite and eats well. No other symptoms.    MEDICAL HISTORY:  Past Medical History  Diagnosis Date  . Enlarged thyroid   . Anemia   . Cancer     colon cancer     SURGICAL HISTORY: Past Surgical History  Procedure Laterality Date  . Tonsillectomy    . Cervical spine surgery  06/17/2012    C5-C7 ACDF  . Cesarean section    . Partial hysterectomy    . Portacath placement Left 12/22/2014    Procedure: INSERTION PORT-A-CATH LEFT SUBCLAVIAN;  Surgeon: [name], MD;  Location: [facility];  Service: General;  Laterality: Left;    SOCIAL HISTORY: History   Social History  . Marital Status: Married    Spouse Name: N/A    Number of Children: 2  . Years of Education: N/A   Occupational History  . Not on file.   Social History Main Topics  . Smoking status: Never Smoker   . Smokeless tobacco: Never Used  . Alcohol Use: No  . Drug Use: No  . Sexual Activity: Not on file    P2G2, one child was murdered. She is a Paramedic.   FAMILY HISTORY: Family History  Problem Relation Age of Onset  . Colon cancer Neg Hx   . Liver Cancer Brother        . Hypertension Mother     ALLERGIES:  is allergic to other and shellfish allergy.  MEDICATIONS:  Current Outpatient Prescriptions  Medication Sig Dispense Refill  . diphenhydrAMINE (BENADRYL) 12.5 MG/5ML liquid Take 12.5-25 mg by mouth every 4 (four) hours as needed for itching or allergies.    Marland Kitchen gabapentin (NEURONTIN) 100 MG capsule Take 1 capsule (100 mg total) by mouth 3 (three) times daily. 90 capsule 1  .  Hydrocodone-Acetaminophen (VICODIN) 5-300 MG TABS Take 1 tablet by mouth every 21 ( twenty-one) days. (Patient taking differently: Take 1 tablet by mouth as needed. ) 5 each 0  . hydrocortisone cream 0.5 % Apply 1 application topically 2 (two) times daily as needed for itching.    Marland Kitchen oxyCODONE (OXY IR/ROXICODONE) 5 MG immediate release tablet Take 1 tablet (5 mg total) by mouth every 6 (six) hours as needed for severe pain. 60 tablet 0  . lidocaine-prilocaine (EMLA) cream Apply 1 application topically as needed. (Patient not taking: Reported on 07/18/2015) 30 g 2  . ondansetron (ZOFRAN) 8 MG tablet Take 1 by mouth twice a day for 2 days after chemotherapy and then twice daily as needed (Patient not taking: Reported on 07/18/2015) 30 tablet 1  . triamcinolone (KENALOG) 0.025 % ointment Apply 1 application topically 2 (two) times daily. (Patient not taking: Reported on 07/18/2015) 30 g 1   No current facility-administered medications for this visit.    REVIEW OF SYSTEMS:   Constitutional: Denies fevers, chills or abnormal night sweats Eyes: Denies blurriness of vision, double vision or watery eyes Ears, nose, mouth, throat, and face: Denies mucositis or sore throat Respiratory: Denies cough, dyspnea or wheezes Cardiovascular: Denies palpitation, chest discomfort or lower extremity swelling Gastrointestinal:  Denies nausea, heartburn or change in bowel habits Skin: Denies abnormal skin rashes Lymphatics: Denies new lymphadenopathy or easy bruising Neurological:Denies numbness, tingling or new weaknesses Behavioral/Psych: Mood is stable, no new changes  All other systems were reviewed with the patient and are negative.  PHYSICAL EXAMINATION: ECOG PERFORMANCE STATUS: 2 Vital sign was reviewed, normal.  GENERAL:alert, no distress and comfortable SKIN: skin color, texture, turgor are normal, no rashes or significant lesions EYES: normal, conjunctiva are pink and non-injected, sclera  clear OROPHARYNX:no exudate, no erythema and lips, buccal mucosa, and tongue normal  NECK: supple, thyroid normal size, non-tender, without nodularity LYMPH:  no palpable lymphadenopathy in the cervical, axillary or inguinal LUNGS: clear to auscultation and percussion with normal breathing effort HEART: regular rate & rhythm and no murmurs and no lower extremity edema ABDOMEN:abdomen soft, non-tender and normal bowel sounds. Surgical wound has well-healed Musculoskeletal:no cyanosis of digits and no clubbing  PSYCH: alert & oriented x 3 with fluent speech NEURO: no focal motor/sensory deficits  LABORATORY DATA:  I have reviewed the data as listed CBC Latest Ref Rng 07/17/2015 05/17/2015 05/03/2015  WBC 3.9 - 10.3 10e3/uL 5.7 4.7 3.7(L)  Hemoglobin 11.6 - 15.9 g/dL 12.2 13.2 11.7  Hematocrit 34.8 - 46.6 % 36.2 40.0 35.6  Platelets 145 - 400 10e3/uL 207 197 135(L)    CMP Latest Ref Rng 07/17/2015 05/17/2015 05/03/2015  Glucose 70 - 140 mg/dl 154(H) 144(H) 134  BUN 7.0 - 26.0 mg/dL 11.6 11.4 12.0  Creatinine 0.6 - 1.1 mg/dL 0.7 0.9 0.8  Sodium 136 - 145 mEq/L 138 140 140  Potassium 3.5 - 5.1 mEq/L 4.0 4.3  4.0  Chloride 96 - 112 mEq/L - - -  CO2 22 - 29 mEq/L 22 21(L) 23  Calcium 8.4 - 10.4 mg/dL 9.2 9.6 9.0  Total Protein 6.4 - 8.3 g/dL 6.9 7.8 7.2  Total Bilirubin 0.20 - 1.20 mg/dL 0.63 0.47 0.28  Alkaline Phos 40 - 150 U/L 56 73 77  AST 5 - 34 U/L _0 ALT 0 - 55 U/L _1 PATHOLOGY REPORT 11/14/2014 Diagnosis(continued) 1. Colon, segmental resection for tumor, sigmoid - INVASIVE ADENOCARCINOMA, INVADING THROUGH THE MUSCULARIS PROPRIA INTO PERICOLONIC FATTY TISSUE. - ONE OF TWENTY-EIGHT LYMPH NODES, POSITIVE FOR METASTATIC CARCINOMA (1/28). - RESECTION MARGINS, NEGATIVE FOR ATYPIA OR MALIGNANCY - RESECTION MARGINS, NEGATIVE FOR ATYPIA OR MALIGNANCY. 2. Colon, resection margin (donut), colon final distal margin - BENIGN COLONIC MUCOSA, NO EVIDENCE OF  MALIGNANCY. Microscopic Comment 1. COLON Specimen: Sigmoid colon Procedure: Segmental resection Tumor site: Sigmoid colon Specimen integrity: Intact Macroscopic intactness of mesorectum: N/A Macroscopic tumor perforation: No Invasive tumor: Maximum size: 2.7 cm, gross measurement Histologic type(s): Invasive adenocarcinoma Histologic grade and differentiation: G2: moderately differentiated/low grade Type of polyp tumor arose from: Tubular adenoma Microscopic extension of invasive tumor: Invading through the muscularis propria into pericolonic fatty tissue. Lymph-Vascular invasion: Not identified Peri-neural invasion: Not identified Tumor deposit(s) (discontinuous extramural extension): N/A Resection margins: Negative Open margin: 7.1 cm Stapled margin: 5.5 cm Mesenteric margin (sigmoid and transverse): 9 cm Treatment effect (neo-adjuvant therapy): No Additional polyp(s): N/A Non-neoplastic findings: N/A Lymph nodes: number examined 28; number positive: 1 Pathologic Staging: pT3, pN1a, pMX Ancillary studies: MMR stains will be performed and an addendum report will follow. MSI testing will be performed at an outside institution and the results will be available in EPIC. (HCL:kh 11-15-14)  TUMOR MSI: stable (by PCR),  MMR: NORMAL   RADIOGRAPHIC STUDIES: I have personally reviewed the radiological images as listed and agreed with the findings in the report.  CT chest, abdomen and pelvis 07/17/2015 IMPRESSION: Suture line in the sigmoid colon, compatible with interval surgery.  No evidence for metastatic disease in the chest, abdomen, or pelvis.  ASSESSMENT & PLAN:  54 year old African-American female, without significant past medical history, presents with abdominal pain and bloody bowel movement, anemia, was found to have a sigmoid adenocarcinoma, status post complete surgical resection.   1. Sigmoid colon adenocarcinoma, G2, pT3pN1aM0, stage IIIB, MSI stable  -the nature  history of colon cancer, and high risk of cancer recurrence. IIIB disease with discussed and reviewed with her -She is now completed her adjuvant chemotherapy, she had chemo dose reduced and last 2 cycles with canceled due to her tolerance issue. -I discussed her repeat his CT scan from 07/13/2015, which showed NED -we'll continue surveillance. I'll see her every 3-4 months for the first 2 years, then every 6 months afterwards for total 5 years. We will check her lab and CEA was each visit, CT scan every 6-12 months, and colonoscopy in one year after her surgery.  2. Peripheral neuropathy andbody pain, secondary to chemo -She states she has diffuse body pain after chemotherapy, she takes Tylenol and ibuprofen but not sufficient. -Continue  Oxycodone as needed. We'll weaned off after she completes chemotherapy, but she has some difficulty -I'll refer her to physical therapy  3. Microcytic anemia, likely iron deficient anemia secondary to colon cancer, and chemotherapy related anemia -Her serum iron and saturation were low. Likely from her colon cancer relates.  -Her anemia improved after IV Feraheme.  -anemia  resolved now   4. Grade 2 neuropathy -Secondary to chemotherapy -continue neurontin, i recommend to increase her dose to 326m tid, she will start at night, then go up the dose during the day    Plan  -I'll refer her to survivorship in 1 month's -Physical therapy referral -Return to clinic and see me in 3 months with lab  All questions were answered. The patient knows to call the clinic with any problems,  questions or concerns.  I spent 20 minutes counseling the patient face to face. The total time spent in the appointment was 25 minutes and more than 50% was on counseling.     FTruitt Merle MD 07/18/2015 3:17 PM

## 2015-07-20 ENCOUNTER — Ambulatory Visit (HOSPITAL_COMMUNITY): Payer: BC Managed Care – PPO

## 2015-07-24 ENCOUNTER — Telehealth: Payer: Self-pay | Admitting: *Deleted

## 2015-07-24 NOTE — Telephone Encounter (Signed)
Please let patient know that our survivorship clinic is not ready for her yet, will schedule her in the future when it's ready.  Thanks.  Truitt Merle  07/24/2015

## 2015-07-24 NOTE — Telephone Encounter (Addendum)
"  Called as instructed by Dr. Burr Medico told me to call if appointments were not scheduled.  I've not heard anything about the appointments."  This nurse observed Symptom management request and PT for peripheral neuropathy from chemotherapy.  Scheduler has notified survivorship NP and referral has been entered.

## 2015-07-24 NOTE — Telephone Encounter (Signed)
Called patient and left voicemail with information per Dr. Burr Medico.

## 2015-07-25 ENCOUNTER — Other Ambulatory Visit: Payer: Self-pay | Admitting: General Surgery

## 2015-07-26 ENCOUNTER — Encounter (HOSPITAL_COMMUNITY): Payer: Self-pay | Admitting: *Deleted

## 2015-07-26 ENCOUNTER — Telehealth: Payer: Self-pay | Admitting: *Deleted

## 2015-07-26 NOTE — Telephone Encounter (Signed)
"  My principal would like the form today.  Form was completed previously in April or May of this year.  You can change the dates and fax this to my job.  They need to know how long I will be out of work to recruit a substitute for my position."    This nurse unable to locate form in [place].  Will notify collaborative nurse and H.I.M.  Ms. Chaudhary notified.  She will try to obtain a new form today for [facility] to complete while she awaits return call.  She can be reached at [phone].

## 2015-07-26 NOTE — Telephone Encounter (Signed)
Pt called stating that she is not ready to go back to work due to neuropathy & she is supposed to have some physical therapy & is planning to have port out on fri.  She works for the [employer] they need a date for return. Note to Dr Burr Medico.

## 2015-07-27 NOTE — Telephone Encounter (Signed)
Called & left message with Jenny [name] @ [phone] to discuss pt's return to work dates.  Message left that she is unable to return to work on Monday, 07/31/15 due to neuropathy & physical therapy.  Requested return call to let us know what they need from [name].  Message also left with Raquel/[facility] to f/u on this.

## 2015-07-28 ENCOUNTER — Ambulatory Visit (HOSPITAL_COMMUNITY): Payer: BC Managed Care – PPO | Admitting: Anesthesiology

## 2015-07-28 ENCOUNTER — Encounter: Payer: Self-pay | Admitting: Hematology

## 2015-07-28 ENCOUNTER — Encounter (HOSPITAL_COMMUNITY): Payer: Self-pay | Admitting: *Deleted

## 2015-07-28 ENCOUNTER — Ambulatory Visit (HOSPITAL_COMMUNITY)
Admission: RE | Admit: 2015-07-28 | Discharge: 2015-07-28 | Disposition: A | Discharge status: Home / Self Care | Payer: BC Managed Care – PPO | Source: Ambulatory Visit | Attending: General Surgery | Admitting: General Surgery

## 2015-07-28 ENCOUNTER — Encounter (HOSPITAL_COMMUNITY)
Admission: RE | Disposition: A | Discharge status: Home / Self Care | Payer: Self-pay | Source: Ambulatory Visit | Attending: General Surgery

## 2015-07-28 DIAGNOSIS — Z85038 Personal history of other malignant neoplasm of large intestine: Secondary | ICD-10-CM | POA: Insufficient documentation

## 2015-07-28 DIAGNOSIS — Z452 Encounter for adjustment and management of vascular access device: Secondary | ICD-10-CM | POA: Insufficient documentation

## 2015-07-28 HISTORY — PX: PORT-A-CATH REMOVAL: SHX5289

## 2015-07-28 SURGERY — REMOVAL PORT-A-CATH
Anesthesia: Monitor Anesthesia Care | Site: Chest | Laterality: Left

## 2015-07-28 MED ORDER — PROPOFOL 10 MG/ML IV BOLUS
INTRAVENOUS | Status: AC
  Filled 2015-07-28: qty 20

## 2015-07-28 MED ORDER — FENTANYL CITRATE (PF) 100 MCG/2ML IJ SOLN
INTRAMUSCULAR | Status: DC | PRN
  Administered 2015-07-28 (×3): 25 ug via INTRAVENOUS

## 2015-07-28 MED ORDER — BUPIVACAINE-EPINEPHRINE (PF) 0.25% -1:200000 IJ SOLN
INTRAMUSCULAR | Status: AC
  Filled 2015-07-28: qty 30

## 2015-07-28 MED ORDER — FENTANYL CITRATE (PF) 100 MCG/2ML IJ SOLN
INTRAMUSCULAR | Status: AC
  Filled 2015-07-28: qty 4

## 2015-07-28 MED ORDER — MIDAZOLAM HCL 2 MG/2ML IJ SOLN
INTRAMUSCULAR | Status: AC
  Filled 2015-07-28: qty 4

## 2015-07-28 MED ORDER — BUPIVACAINE-EPINEPHRINE 0.25% -1:200000 IJ SOLN
INTRAMUSCULAR | Status: DC | PRN
  Administered 2015-07-28: 7 mL

## 2015-07-28 MED ORDER — 0.9 % SODIUM CHLORIDE (POUR BTL) OPTIME
TOPICAL | Status: DC | PRN
  Administered 2015-07-28: 1000 mL

## 2015-07-28 MED ORDER — CEFAZOLIN SODIUM-DEXTROSE 2-3 GM-% IV SOLR
INTRAVENOUS | Status: DC | PRN
  Administered 2015-07-28: 2 g via INTRAVENOUS

## 2015-07-28 MED ORDER — LACTATED RINGERS IV SOLN
INTRAVENOUS | Status: DC
  Administered 2015-07-28: 1000 mL via INTRAVENOUS

## 2015-07-28 MED ORDER — MIDAZOLAM HCL 5 MG/5ML IJ SOLN
INTRAMUSCULAR | Status: DC | PRN
Start: 2015-07-28 — End: 2015-07-28
  Administered 2015-07-28 (×2): 1 mg via INTRAVENOUS

## 2015-07-28 MED ORDER — LIDOCAINE HCL 1 % IJ SOLN
INTRAMUSCULAR | Status: DC | PRN
  Administered 2015-07-28: 50 mg via INTRADERMAL

## 2015-07-28 MED ORDER — CEFAZOLIN SODIUM-DEXTROSE 2-3 GM-% IV SOLR
INTRAVENOUS | Status: AC
  Filled 2015-07-28: qty 50

## 2015-07-28 MED ORDER — LACTATED RINGERS IV SOLN
INTRAVENOUS | Status: DC | PRN
  Administered 2015-07-28: 13:00:00 via INTRAVENOUS

## 2015-07-28 MED ORDER — PROPOFOL 10 MG/ML IV BOLUS
INTRAVENOUS | Status: DC | PRN
  Administered 2015-07-28 (×2): 20 mg via INTRAVENOUS
  Administered 2015-07-28: 30 mg via INTRAVENOUS
  Administered 2015-07-28: 20 mg via INTRAVENOUS
  Administered 2015-07-28: 30 mg via INTRAVENOUS
  Administered 2015-07-28: 20 mg via INTRAVENOUS
  Administered 2015-07-28: 50 mg via INTRAVENOUS
  Administered 2015-07-28: 20 mg via INTRAVENOUS

## 2015-07-28 MED ORDER — LIDOCAINE HCL (CARDIAC) 20 MG/ML IV SOLN
INTRAVENOUS | Status: AC
  Filled 2015-07-28: qty 5

## 2015-07-28 MED ORDER — ONDANSETRON HCL 4 MG/2ML IJ SOLN
INTRAMUSCULAR | Status: DC | PRN
  Administered 2015-07-28: 4 mg via INTRAVENOUS

## 2015-07-28 MED ORDER — PROPOFOL INFUSION 10 MG/ML OPTIME: INTRAVENOUS | Status: DC | PRN

## 2015-07-28 SURGICAL SUPPLY — 32 items
APL SKNCLS STERI-STRIP NONHPOA (GAUZE/BANDAGES/DRESSINGS) ×1
BENZOIN TINCTURE PRP APPL 2/3 (GAUZE/BANDAGES/DRESSINGS) ×1 IMPLANT
BLADE HEX COATED 2.75 (ELECTRODE) ×2 IMPLANT
BLADE SURG 15 STRL LF DISP TIS (BLADE) ×1 IMPLANT
BLADE SURG 15 STRL SS (BLADE) ×2
COVER SURGICAL LIGHT HANDLE (MISCELLANEOUS) ×1 IMPLANT
DECANTER SPIKE VIAL GLASS SM (MISCELLANEOUS) ×2 IMPLANT
DRAPE LAPAROTOMY TRNSV 102X78 (DRAPE) ×2 IMPLANT
ELECT PENCIL ROCKER SW 15FT (MISCELLANEOUS) ×2 IMPLANT
ELECT REM PT RETURN 9FT ADLT (ELECTROSURGICAL) ×2
ELECTRODE REM PT RTRN 9FT ADLT (ELECTROSURGICAL) ×1 IMPLANT
GAUZE SPONGE 4X4 12PLY STRL (GAUZE/BANDAGES/DRESSINGS) ×1 IMPLANT
GLOVE BIO SURGEON STRL SZ 6.5 (GLOVE) ×2 IMPLANT
GLOVE BIOGEL PI IND STRL 7.0 (GLOVE) ×1 IMPLANT
GLOVE BIOGEL PI INDICATOR 7.0 (GLOVE) ×1
GOWN L4 XXLG W/PAP TWL (GOWN DISPOSABLE) ×2 IMPLANT
KIT BASIN OR (CUSTOM PROCEDURE TRAY) ×2 IMPLANT
LIQUID BAND (GAUZE/BANDAGES/DRESSINGS) ×1 IMPLANT
NDL HYPO 25X1 1.5 SAFETY (NEEDLE) ×1 IMPLANT
NEEDLE HYPO 25X1 1.5 SAFETY (NEEDLE) ×2 IMPLANT
PACK BASIC VI WITH GOWN DISP (CUSTOM PROCEDURE TRAY) ×2 IMPLANT
SPONGE LAP 4X18 X RAY DECT (DISPOSABLE) ×2 IMPLANT
STRIP CLOSURE SKIN 1/2X4 (GAUZE/BANDAGES/DRESSINGS) ×1 IMPLANT
SUT MNCRL AB 4-0 PS2 18 (SUTURE) ×1 IMPLANT
SUT VIC AB 2-0 SH 27 (SUTURE) ×2
SUT VIC AB 2-0 SH 27X BRD (SUTURE) IMPLANT
SUT VIC AB 3-0 SH 18 (SUTURE) ×1 IMPLANT
SUT VICRYL 4-0 (SUTURE) ×1 IMPLANT
SYR CONTROL 10ML LL (SYRINGE) ×2 IMPLANT
TOWEL OR 17X26 10 PK STRL BLUE (TOWEL DISPOSABLE) ×2 IMPLANT
TOWEL OR NON WOVEN STRL DISP B (DISPOSABLE) ×2 IMPLANT
YANKAUER SUCT BULB TIP 10FT TU (MISCELLANEOUS) IMPLANT

## 2015-07-28 NOTE — Discharge Instructions (Signed)
GENERAL SURGERY: POST OP INSTRUCTIONS  1. DIET: Follow a light bland diet the first 24 hours after arrival home, such as soup, liquids, crackers, etc.  Be sure to include lots of fluids daily.  Avoid fast food or heavy meals as your are more likely to get nauseated.   2. Take your usually prescribed home medications unless otherwise directed. 3. PAIN CONTROL: a. Pain is best controlled by a usual combination of three different methods TOGETHER: i. Ice/Heat ii. Over the counter pain medication b. Most patients will experience some swelling and bruising around the incisions.  Ice packs or heating pads (30-60 minutes up to 6 times a day) will help. Use ice for the first few days to help decrease swelling and bruising, then switch to heat to help relax tight/sore spots and speed recovery.  Some people prefer to use ice alone, heat alone, alternating between ice & heat.  Experiment to what works for you.  Swelling and bruising can take several weeks to resolve.   c. It is helpful to take an over-the-counter pain medication regularly for the first few weeks.  Choose one of the following that works best for you: i. Naproxen (Aleve, etc)  Two 220mg  tabs twice a day ii. Ibuprofen (Advil, etc) Three 200mg  tabs four times a day (every meal & bedtime) 4. Avoid getting constipated.  Between the surgery and the pain medications, it is common to experience some constipation.  Increasing fluid intake and taking a fiber supplement (such as Metamucil, Citrucel, FiberCon, MiraLax, etc) 1-2 times a day regularly will usually help prevent this problem from occurring.  A mild laxative (prune juice, Milk of Magnesia, MiraLax, etc) should be taken according to package directions if there are no bowel movements after 48 hours.   5. Wash / shower every day.  You may shower over the dressings as they are waterproof.  Continue to shower over incision(s) after the dressing is off. 6. Remove your waterproof bandages 5 days after  surgery.  You may leave the incision open to air.  You may have skin tapes (Steri Strips) covering the incision(s).  Leave them on until one week, then remove.  You may replace a dressing/Band-Aid to cover the incision for comfort if you wish.      7. ACTIVITIES as tolerated:   a. You may resume regular (light) daily activities beginning the next day--such as daily self-care, walking, climbing stairs--gradually increasing activities as tolerated.  If you can walk 30 minutes without difficulty, it is safe to try more intense activity such as jogging, treadmill, bicycling, low-impact aerobics, swimming, etc. b. Save the most intensive and strenuous activity for last such as sit-ups, heavy lifting, contact sports, etc  Refrain from any heavy lifting or straining until you are off narcotics for pain control.   c. DO NOT PUSH THROUGH PAIN.  Let pain be your guide: If it hurts to do something, don't do it.  Pain is your body warning you to avoid that activity for another week until the pain goes down. d. You may drive when you are no longer taking prescription pain medication, you can comfortably wear a seatbelt, and you can safely maneuver your car and apply brakes. e. Dennis Bast may have sexual intercourse when it is comfortable.  8. FOLLOW UP in our office a. Please call [facility] at [phone] to set up an appointment to see your surgeon in the office for a follow-up appointment approximately 2-3 weeks after your surgery. b. Make sure that  you call for this appointment the day you arrive home to insure a convenient appointment time. 9. IF YOU HAVE DISABILITY OR FAMILY LEAVE FORMS, BRING THEM TO THE OFFICE FOR PROCESSING.  DO NOT GIVE THEM TO YOUR DOCTOR.   WHEN TO CALL us [phone]: 1. Poor pain control 2. Reactions / problems with new medications (rash/itching, nausea, etc)  3. Fever over 101.5 F (38.5 C) 4. Worsening swelling or bruising 5. Continued bleeding from incision. 6. Increased pain,  redness, or drainage from the incision   The clinic staff is available to answer your questions during regular business hours (8:30am-5pm).  Please dont hesitate to call and ask to speak to one of our nurses for clinical concerns.   If you have a medical emergency, go to the nearest emergency room or call 911.  A surgeon from [facility] is always on call at the [facility], [place], [place], [place], [place]  [address] ? MAIN: [phone] ? TOLL FREE: [phone] ?  FAX [phone] [email or web address]

## 2015-07-28 NOTE — Transfer of Care (Signed)
Immediate Anesthesia Transfer of Care Note  Patient: Jenny [name]  Procedure(s) Performed: Procedure(s): REMOVAL PORT-A-CATH (Left)  Patient Location: PACU  Anesthesia Type:MAC  Level of Consciousness: awake, alert , oriented and patient cooperative  Airway & Oxygen Therapy: Patient Spontanous Breathing and Patient connected to face mask oxygen  Post-op Assessment: Report given to RN, Post -op Vital signs reviewed and stable and Patient moving all extremities  Post vital signs: Reviewed and stable  Last Vitals:  Filed Vitals:   07/28/15 1113  BP: 132/87  Pulse: 1  Temp: 36.6 C  Resp: 18    Complications: No apparent anesthesia complications

## 2015-07-28 NOTE — Progress Notes (Signed)
I placed std forms on desk for dr. Burr Medico. I called to find out the date she became disabled and her vmail is full- no message can be left.

## 2015-07-28 NOTE — Progress Notes (Signed)
I also advised the patient if there are other forms she needs to have them fax them to me.  I spoke with her on Thursday.

## 2015-07-28 NOTE — Anesthesia Preprocedure Evaluation (Addendum)
Anesthesia Evaluation  Patient identified by MRN, date of birth, ID band Patient awake    Reviewed: Allergy & Precautions, H&P , NPO status , Patient's Chart, lab work & pertinent test results, reviewed documented beta blocker date and time   Airway Mallampati: III  TM Distance: >3 FB Neck ROM: Full    Dental  (+) Teeth Intact   Pulmonary shortness of breath,  breath sounds clear to auscultation        Cardiovascular Rhythm:Regular     Neuro/Psych negative neurological ROS     GI/Hepatic   Endo/Other  probale pre- diabetic  Renal/GU      Musculoskeletal   Abdominal (+) + obese,   Peds  Hematology  (+) anemia , Hgb 12   Anesthesia Other Findings Colon cancer s/p port placement here for removal  Reproductive/Obstetrics                           Anesthesia Physical  Anesthesia Plan  ASA: III  Anesthesia Plan: MAC   Post-op Pain Management:    Induction: Intravenous  Airway Management Planned: Natural Airway  Additional Equipment:   Intra-op Plan:   Post-operative Plan: Extubation in OR  Informed Consent: I have reviewed the patients History and Physical, chart, labs and discussed the procedure including the risks, benefits and alternatives for the proposed anesthesia with the patient or authorized representative who has indicated his/her understanding and acceptance.     Plan Discussed with: Anesthesiologist and CRNA  Anesthesia Plan Comments:        Anesthesia Quick Evaluation

## 2015-07-28 NOTE — H&P (Signed)
Jenny [name] is a 54 y.o. F who received chemotherapy adjuvantly for colorectal cancer.  She has completed her therapy and is ready to have her port removed.  Repeat CT scans show no evidence of disease.  Past Medical History  Diagnosis Date  . Enlarged thyroid   . Anemia   . Cancer     colon cancer    Past Surgical History  Procedure Laterality Date  . Tonsillectomy    . Cervical spine surgery  06/17/2012    C5-C7 ACDF  . Cesarean section    . Partial hysterectomy    . Portacath placement Left 12/22/2014    Procedure: INSERTION PORT-A-CATH LEFT SUBCLAVIAN;  Surgeon: [name], MD;  Location: [facility];  Service: General;  Laterality: Left;   History   Social History  . Marital Status: Married    Spouse Name: N/A  . Number of Children: N/A  . Years of Education: N/A   Occupational History  . Not on file.   Social History Main Topics  . Smoking status: Never Smoker   . Smokeless tobacco: Never Used  . Alcohol Use: No  . Drug Use: No  . Sexual Activity: Not on file   Other Topics Concern  . Not on file   Social History Narrative   Family History  Problem Relation Age of Onset  . Colon cancer Neg Hx   . Cancer Brother   . Cancer Brother   . Hypertension Mother      Medication List           acetaminophen 500 MG tablet  Commonly known as:  TYLENOL  Take 500 mg by mouth every 6 (six) hours as needed.     diphenhydrAMINE 12.5 MG/5ML liquid  Commonly known as:  BENADRYL  Take 12.5-25 mg by mouth every 4 (four) hours as needed for itching or allergies.     gabapentin 300 MG capsule  Commonly known as:  NEURONTIN  Take 1 capsule (300 mg total) by mouth 3 (three) times daily.     Hydrocodone-Acetaminophen 5-300 MG Tabs  Commonly known as:  VICODIN  Take 1 tablet by mouth every 21 ( twenty-one) days.     hydrocortisone cream 0.5 %  Apply 1 application topically 2 (two) times daily as needed for itching.     lidocaine-prilocaine cream  Commonly known  as:  EMLA  Apply 1 application topically as needed.     loratadine 10 MG tablet  Commonly known as:  CLARITIN  Take 10 mg by mouth daily. AS NEEDED     multivitamin tablet  Take 1 tablet by mouth daily.     naproxen sodium 220 MG tablet  Commonly known as:  ANAPROX  Take 220 mg by mouth 2 (two) times daily with a meal.     ondansetron 8 MG tablet  Commonly known as:  ZOFRAN  Take 1 by mouth twice a day for 2 days after chemotherapy and then twice daily as needed     oxyCODONE 5 MG immediate release tablet  Commonly known as:  Oxy IR/ROXICODONE  Take 1 tablet (5 mg total) by mouth every 6 (six) hours as needed for severe pain.     triamcinolone 0.025 % ointment  Commonly known as:  KENALOG  Apply 1 application topically 2 (two) times daily.       Allergies  Allergen Reactions  . Other Rash    *Derma Bond*  . Shellfish Allergy Rash   Review of Systems  Constitutional:  Negative for fever and chills.  Eyes: Negative for blurred vision.  Respiratory: Negative for cough and shortness of breath.   Cardiovascular: Negative for chest pain.  Gastrointestinal: Negative for nausea, vomiting and abdominal pain.  Genitourinary: Negative for dysuria, urgency and frequency.  Skin: Negative for itching and rash.  Neurological: Negative for dizziness and headaches.    Physical Exam  Constitutional: She is oriented to person, place, and time. She appears well-developed and well-nourished.  HENT:  Head: Normocephalic and atraumatic.  Eyes: EOM are normal. Pupils are equal, round, and reactive to light.  Neck: Normal range of motion. Neck supple.  Cardiovascular: Normal rate, regular rhythm and normal heart sounds.   Pulmonary/Chest: Effort normal and breath sounds normal.  Abdominal: Soft.  Neurological: She is alert and oriented to person, place, and time.  Skin: Skin is warm and dry.    Assessment:  Port in place  Plan: We will remove the port per her request, as it is  causing her some discomfort and she does not need this right now.  Risks include bleeding and infection.

## 2015-07-28 NOTE — Op Note (Signed)
07/28/2015  2:59 PM  PATIENT:  Jenny [name]  54 y.o. female  Patient Care Team: Lind Covert, MD as PCP - General Juanita Craver, MD as Consulting Physician (Gastroenterology) [name], MD as Consulting Physician (General Surgery) Truitt Merle, MD as Consulting Physician (Hematology)  PRE-OPERATIVE DIAGNOSIS:  colon cancer  POST-OPERATIVE DIAGNOSIS:  colon cancer  PROCEDURE:  Procedure(s): REMOVAL PORT-A-CATH  SURGEON:  Surgeon(s): [name], MD  ASSISTANT: none   ANESTHESIA:   IV sedation  EBL: min     DRAINS: none   SPECIMEN:  Source of Specimen:  port  DISPOSITION OF SPECIMEN:  N/A  COUNTS:  YES  PLAN OF CARE: Discharge to home after PACU  PATIENT DISPOSITION:  PACU - hemodynamically stable.  INDICATION: The 54 year old female who had a Port-A-Cath placed for adjuvant chemotherapy. She is completed her chemotherapy and her follow-up CT scans are negative for any signs of metastatic disease. She is here today for Port-A-Cath removal. Risk and benefits were explained to the patient prior the OR and consent was signed placed on chart.   OR FINDINGS: Standard port, removed in total  DESCRIPTION: the patient was identified in the preoperative holding area and taken to the OR where they were laid supine on the operating room table.  IV sedation anesthesia was induced without difficulty. SCDs were also noted to be in place prior to the initiation of anesthesia.  The patient was then prepped and draped in the usual sterile fashion.   A surgical timeout was performed indicating the correct patient, procedure, positioning and need for preoperative antibiotics.   I began by using Court percent Marcaine with epinephrine to anesthetize the area where the port was placed. I made an incision using a 15 blade scalpel through the previous scar. Dissection was carried down to the level of the port. The 2 fixation sutures were cut using scissors. The port was removed. A  figure-of-eight suture was placed over the catheter exit points to allow for hemostasis. 3-0 Vicryl sutures were used to close the port cavity in an interrupted fashion. Subcutaneous tissue was also closed using interrupted 3-0 Vicryl sutures. The skin was closed in running 4-0 Vicryl suture. Sterile dressing was applied. The patient was awakened from anesthesia and sent to the postanesthesia care unit in stable condition. All counts were correct per operating room staff.

## 2015-07-28 NOTE — Progress Notes (Signed)
I faxed last notes to disability [employer] [phone].

## 2015-07-31 ENCOUNTER — Encounter (HOSPITAL_COMMUNITY): Payer: Self-pay | Admitting: General Surgery

## 2015-07-31 NOTE — Telephone Encounter (Signed)
Raquel, do you know if this was taken care of? Thanks.  Truitt Merle

## 2015-08-02 ENCOUNTER — Telehealth: Payer: Self-pay | Admitting: *Deleted

## 2015-08-02 NOTE — Telephone Encounter (Signed)
Incoming voice mail from [place] that she has not heard from PT yet. Sent message to scheduler to please follow up on the status of referral placed on 07/18/15 and to get back with Dr. Ernestina Penna collaborative nurse.

## 2015-08-02 NOTE — Telephone Encounter (Signed)
Call to follow up on her neuropathy and PT referral. No answer. Left VM to call office with any questions or if she need assistance with the referral.

## 2015-08-03 NOTE — Anesthesia Postprocedure Evaluation (Signed)
  Anesthesia Post-op Note  Patient: Jenny [name]  Procedure(s) Performed: Procedure(s) (LRB): REMOVAL PORT-A-CATH (Left)  Patient Location: PACU  Anesthesia Type: General  Level of Consciousness: awake and alert   Airway and Oxygen Therapy: Patient Spontanous Breathing  Post-op Pain: mild  Post-op Assessment: Post-op Vital signs reviewed, Patient's Cardiovascular Status Stable, Respiratory Function Stable, Patent Airway and No signs of Nausea or vomiting  Last Vitals:  Filed Vitals:   07/28/15 1628  BP: 102/66  Pulse: 72  Temp: 36.4 C  Resp: 18    Post-op Vital Signs: stable   Complications: No apparent anesthesia complications

## 2015-08-09 ENCOUNTER — Ambulatory Visit: Payer: BC Managed Care – PPO | Attending: Hematology | Admitting: Physical Therapy

## 2015-08-09 ENCOUNTER — Encounter: Payer: Self-pay | Admitting: Physical Therapy

## 2015-08-09 ENCOUNTER — Ambulatory Visit: Payer: BC Managed Care – PPO | Admitting: Physical Therapy

## 2015-08-09 DIAGNOSIS — M79671 Pain in right foot: Secondary | ICD-10-CM | POA: Diagnosis not present

## 2015-08-09 DIAGNOSIS — G622 Polyneuropathy due to other toxic agents: Secondary | ICD-10-CM | POA: Diagnosis present

## 2015-08-09 DIAGNOSIS — G620 Drug-induced polyneuropathy: Secondary | ICD-10-CM

## 2015-08-09 DIAGNOSIS — T451X5A Adverse effect of antineoplastic and immunosuppressive drugs, initial encounter: Secondary | ICD-10-CM

## 2015-08-09 DIAGNOSIS — M79672 Pain in left foot: Secondary | ICD-10-CM | POA: Insufficient documentation

## 2015-08-09 DIAGNOSIS — R269 Unspecified abnormalities of gait and mobility: Secondary | ICD-10-CM | POA: Diagnosis present

## 2015-08-09 NOTE — Therapy (Signed)
[place], [place], [address] Phone: [phone]   Fax:  [phone]  Physical Therapy Evaluation  Patient Details  Name: Jenny [name] MRN: [id number] Date of Birth: 1961/08/10 Referring Provider:  Truitt Merle, MD  Encounter Date: 08/09/2015      PT End of Session - 08/09/15 1710    Visit Number 1   Number of Visits 5   Date for PT Re-Evaluation 09/01/15   Activity Tolerance Patient limited by pain   Behavior During Therapy [facility] for tasks assessed/performed      Past Medical History  Diagnosis Date  . Enlarged thyroid   . Anemia   . Cancer     colon cancer     Past Surgical History  Procedure Laterality Date  . Tonsillectomy    . Cervical spine surgery  06/17/2012    C5-C7 ACDF  . Cesarean section    . Partial hysterectomy    . Portacath placement Left 12/22/2014    Procedure: INSERTION PORT-A-CATH LEFT SUBCLAVIAN;  Surgeon: [name], MD;  Location: [facility];  Service: General;  Laterality: Left;  . Port-a-cath removal Left 07/28/2015    Procedure: REMOVAL PORT-A-CATH;  Surgeon: [name], MD;  Location: [facility];  Service: General;  Laterality: Left;    There were no vitals filed for this visit.  Visit Diagnosis:  Pain in both feet - Plan: PT plan of care cert/re-cert  Peripheral neuropathy due to chemotherapy - Plan: PT plan of care cert/re-cert  Abnormality of gait - Plan: PT plan of care cert/re-cert      Subjective Assessment - 08/09/15 1615    Subjective I have a lot of pain in my feet and hands; swelling, especially with my hands; my knuckles hurt to bend my fingers; feet burn and tingle   Pertinent History Colon cancer diagnosed end of September 2015; did surgery in November to remove 12 inches of colon with 28 lymph nodes removed (one positive), stage III.  Adjuvant chemotherapy finished 04/2015.  Had neck disc surgery in approx. 2013 (with plate fusion) with fair outcome.   Has enlarged thyroid that is currentl being worked up.  Trying to adjust to pain from neuropathy.                                           Patient Stated Goals find out what to do to ease this pain or get rid of it   Currently in Pain? Yes   Pain Score 8    Pain Location --  hands and feet   Pain Descriptors / Indicators Burning;Tingling;Numbness  also sharp shooting pain at times in feet   Pain Type Neuropathic pain   Aggravating Factors  being up on feet   Pain Relieving Factors wears soft shoes, thick footies, slides feet instead of stepping down with them   Effect of Pain on Daily Activities difficult to stand from sitting; repositions hands and feet for driving to relieve pressure; takes a long time to get out of bed; slow to do housework   Multiple Pain Sites Yes            [facility] PT Assessment - 08/09/15 0001    Assessment   Medical Diagnosis colon cancer with resection and chemotherapy   Precautions   Precautions Other (comment)   Precaution Comments cancer precautions   Restrictions  Weight Bearing Restrictions No   Balance Screen   Has the patient fallen in the past 6 months No   Has the patient had a decrease in activity level because of a fear of falling?  No   Is the patient reluctant to leave their home because of a fear of falling?  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, museum for kindergarten level   Leisure used to walk for exercise but can't now   Cognition   Overall Cognitive Status Within Functional Limits for tasks assessed   Sensation   Light Touch Impaired Detail   Light Touch Impaired Details Impaired RUE;Impaired LUE;Impaired RLE;Impaired LLE  yet also painful  when touch is felt   ROM / Strength   AROM / PROM / Strength Strength   Strength   Overall Strength Comments both ankles grossly 4+/5;    Strength Assessment Site Hand   Right/Left hand Right;Left   Right Hand Grip (lbs) 23  dynamometer on 2nd slot   Left Hand Grip (lbs) 23   Ambulation/Gait   Ambulation/Gait Yes   Ambulation/Gait Assistance 6: Modified independent (Device/Increase time)  slow, antalgic for neuropathy with marked gait deviation   Ambulation Distance (Feet) 20 Feet  had slight SOB after this   Ambulation Surface Level;Indoor           LYMPHEDEMA/ONCOLOGY QUESTIONNAIRE - 08/09/15 1631    Date Lymphedema/Swelling Started   Date --  hands swell at night                                [place] - 08/09/15 1719    CC Long Term Goal  #1   Title Patient will be independent in self-management of pain using therapy techniques.   Time 2   Period Weeks   Status New   CC Long Term Goal  #2   Title Patient will be knowledgeable about where and how to obtain equipment to help her with pain and mobility (such as a platform walker, TENS unit)   Time 2   Period Weeks   Status New            Plan - 08/09/15 1710    Clinical Impression Statement This is a very pleasant 54 y.o. woman who is s/p colon resection and chemotherapy for colon cancer who has quite severe neuropathic pain in both hands and feet.  Pain makes it difficult for her to stand up due to feet hurting but also because she cannot push off from her hands due to pain.  We discussed the modalities we can try in physical therapy; we also discussed that a platform walker could help take pressure off her feet while not putting pressure on her painful hands.  Patient's grip strength his low, but ankle strength is fairly good (grossly 4+/5).                                                Pt will  benefit from skilled therapeutic intervention in order to improve on the following  deficits Pain;Difficulty walking;Decreased strength;Decreased mobility   Rehab Potential Fair   Clinical Impairments Affecting Rehab Potential CIPN   PT Frequency 2x / week   PT Duration 2 weeks   PT Treatment/Interventions Electrical Stimulation;Other (comment);Manual techniques;Gait training;Contrast Bath;Therapeutic exercise;Balance training  and issue TENS unit for home use if estim is beneficial   PT Next Visit Plan trial of electrical stimulation to low back or feet and hands; trial of massage for feet and/or hands; discuss platform walker further   Recommended Other Services possible referral for platform walker   Consulted and Agree with Plan of Care Patient         Problem List Patient Active Problem List   Diagnosis Date Noted  . Weakness 04/20/2015  . Thyromegaly 12/14/2014  . Contact dermatitis 12/14/2014  . Colon cancer 11/14/2014  . Rectal bleeding 08/11/2014  . Chest pain 05/28/14  . SOB (shortness of breath) 2014/05/28  . Loss or death of family member 09-Jun-2013  . Obesity 05/29/2009    SALIS[place],DONNA 08/09/2015, 5:23 PM  [place], [place], [address] Phone: [phone]   Fax:  [place], PT 08/09/2015 5:24 PM

## 2015-08-16 ENCOUNTER — Ambulatory Visit: Payer: BC Managed Care – PPO

## 2015-08-16 DIAGNOSIS — M79672 Pain in left foot: Secondary | ICD-10-CM

## 2015-08-16 DIAGNOSIS — R269 Unspecified abnormalities of gait and mobility: Secondary | ICD-10-CM

## 2015-08-16 DIAGNOSIS — G620 Drug-induced polyneuropathy: Secondary | ICD-10-CM

## 2015-08-16 DIAGNOSIS — M79671 Pain in right foot: Secondary | ICD-10-CM

## 2015-08-16 DIAGNOSIS — T451X5A Adverse effect of antineoplastic and immunosuppressive drugs, initial encounter: Secondary | ICD-10-CM

## 2015-08-16 NOTE — Patient Instructions (Signed)
PELVIC STABILIZATION: Pelvic Tilt (Lying)   Exhaling, pull belly toward spine, tilting pelvis forward. Inhaling, release. Should feel low back pressing into bed, should not be pushing with legs or using upper body at all. Can also try in sitting.  Repeat _10__ times. Do _3__ times per day.  Copyright  VHI. All rights reserved.   Throughout day open/close hands as tolerated and only use hands, not arms.   Ankle pumps as tolerated throughout day.

## 2015-08-16 NOTE — Therapy (Signed)
[place], [place], [address] Phone: [phone]   Fax:  [phone]  Physical Therapy Treatment  Patient Details  Name: Jenny [name] MRN: [id number] Date of Birth: 10-May-1961 Referring Provider:  Lind Covert, *  Encounter Date: 08/16/2015      PT End of Session - 08/16/15 1224    Visit Number 2   Number of Visits 5   Date for PT Re-Evaluation 09/01/15   PT Start Time 0933   PT Stop Time 1021   PT Time Calculation (min) 48 min   Activity Tolerance Patient limited by pain   Behavior During Therapy [facility] for tasks assessed/performed      Past Medical History  Diagnosis Date  . Enlarged thyroid   . Anemia   . Cancer     colon cancer     Past Surgical History  Procedure Laterality Date  . Tonsillectomy    . Cervical spine surgery  06/17/2012    C5-C7 ACDF  . Cesarean section    . Partial hysterectomy    . Portacath placement Left 12/22/2014    Procedure: INSERTION PORT-A-CATH LEFT SUBCLAVIAN;  Surgeon: [name], MD;  Location: [facility];  Service: General;  Laterality: Left;  . Port-a-cath removal Left 07/28/2015    Procedure: REMOVAL PORT-A-CATH;  Surgeon: [name], MD;  Location: [facility];  Service: General;  Laterality: Left;    There were no vitals filed for this visit.  Visit Diagnosis:  Pain in both feet  Peripheral neuropathy due to chemotherapy  Abnormality of gait      Subjective Assessment - 08/16/15 0946    Subjective My hands and feet are really hurting today. Ready to feel better.    Currently in Pain? Yes   Pain Score 8    Pain Location Other (Comment)  Hands and feet   Pain Orientation Right;Left   Pain Descriptors / Indicators Burning;Tingling;Numbness  intermittent sharp pain in feet   Aggravating Factors  standing too long   Pain Relieving Factors gabapentin helps for about 2 hrs at a time I can get a few things done                          [facility] Adult PT Treatment/Exercise - 08/16/15 0001    Lumbar Exercises: Supine   Ab Set 10 reps  Pelvic tilts   Clam Other (comment)  3 reps with pelvic tilt   Knee/Hip Exercises: Aerobic   Stationary Bike 2 mins no resistance   Knee/Hip Exercises: Seated   Other Seated Knee/Hip Exercises Rocker Board front-back and side-side 2 mins each   Shoulder Exercises: Therapy Ball   Flexion 5 reps   Flexion Limitations CGA throughout for balance   Electrical Stimulation   Electrical Stimulation Location lo-mid back  During supine exercises   Electrical Stimulation Action IFC   Electrical Stimulation Parameters 80-150 Hz x20 mins   Electrical Stimulation Goals Pain   Manual Therapy   Soft tissue mobilization To bil hands gently but pt couldn't tolerate longer than 2-3 mins on each side                        Long Term Clinic Goals - 08/16/15 1229    CC Long Term Goal  #1   Title Patient will be independent in self-management of pain using therapy techniques.   Status On-going   CC Long Term Goal  #  2   Title Patient will be knowledgeable about where and how to obtain equipment to help her with pain and mobility (such as a platform walker, TENS unit)   Status On-going            Plan - 08/16/15 1224    Clinical Impression Statement Todays was Ms. Womacks first treatment since evaluation. Overall, though very limited by pain, she tolerated treatment well and was willing to try all exercises and was good about verbalizing increase pain. Pt did report some relief noted from electrical stimulation.  Pt was unable to tolerate massage to hands today as this increased her pain.    Pt will benefit from skilled therapeutic intervention in order to improve on the following deficits Pain;Difficulty walking;Decreased strength;Decreased mobility   Rehab Potential Fair   Clinical Impairments Affecting Rehab Potential CIPN   PT Frequency 2x /  week   PT Duration 2 weeks   PT Treatment/Interventions Electrical Stimulation;Other (comment);Manual techniques;Gait training;Contrast Bath;Therapeutic exercise;Balance training  and issue home TENS unit if e-stim is beneficial   PT Next Visit Plan Cont electrical stimulation to low back or feet and hands; trial of massage for feet and/or hands as pt tolerates; discuss platform walker further   Consulted and Agree with Plan of Care Patient        Problem List Patient Active Problem List   Diagnosis Date Noted  . Weakness 04/20/2015  . Thyromegaly 12/14/2014  . Contact dermatitis 12/14/2014  . Colon cancer 11/14/2014  . Rectal bleeding 08/11/2014  . Chest pain 05/28/14  . SOB (shortness of breath) 05/28/2014  . Loss or death of family member Jun 09, 2013  . Obesity 05/29/2009    Otelia Limes, PTA 08/16/2015, 12:31 PM  [place], [place], [address] Phone: [phone]   Fax:  [phone]

## 2015-08-22 ENCOUNTER — Ambulatory Visit: Payer: BC Managed Care – PPO | Admitting: Physical Therapy

## 2015-08-22 DIAGNOSIS — M79671 Pain in right foot: Secondary | ICD-10-CM

## 2015-08-22 DIAGNOSIS — T451X5A Adverse effect of antineoplastic and immunosuppressive drugs, initial encounter: Secondary | ICD-10-CM

## 2015-08-22 DIAGNOSIS — G620 Drug-induced polyneuropathy: Secondary | ICD-10-CM

## 2015-08-22 DIAGNOSIS — M79672 Pain in left foot: Secondary | ICD-10-CM

## 2015-08-22 NOTE — Therapy (Signed)
[place], [place], [address] Phone: [phone]   Fax:  [phone]  Physical Therapy Treatment  Patient Details  Name: Jenny [name] MRN: [id number] Date of Birth: May 02, 1961 Referring Provider:  Truitt Merle, MD  Encounter Date: 08/22/2015      PT End of Session - 08/22/15 1731    Visit Number 3   Number of Visits 5   Date for PT Re-Evaluation 09/01/15   PT Start Time 1510   PT Stop Time 9417   PT Time Calculation (min) 64 min   Activity Tolerance Patient limited by pain   Behavior During Therapy [facility] for tasks assessed/performed      Past Medical History  Diagnosis Date  . Enlarged thyroid   . Anemia   . Cancer     colon cancer     Past Surgical History  Procedure Laterality Date  . Tonsillectomy    . Cervical spine surgery  06/17/2012    C5-C7 ACDF  . Cesarean section    . Partial hysterectomy    . Portacath placement Left 12/22/2014    Procedure: INSERTION PORT-A-CATH LEFT SUBCLAVIAN;  Surgeon: [name], MD;  Location: [facility];  Service: General;  Laterality: Left;  . Port-a-cath removal Left 07/28/2015    Procedure: REMOVAL PORT-A-CATH;  Surgeon: [name], MD;  Location: [facility];  Service: General;  Laterality: Left;    There were no vitals filed for this visit.  Visit Diagnosis:  Pain in both feet  Peripheral neuropathy due to chemotherapy      Subjective Assessment - 08/22/15 1513    Subjective "I did some of the exercise between then and now."  Thinks the last session helped a little bit.   Currently in Pain? Yes   Pain Score 8    Pain Location Foot  and hands   Pain Orientation Right;Left   Pain Descriptors / Indicators Burning   Aggravating Factors  putting pressure on   Pain Relieving Factors gabapentin                         [facility] Adult PT Treatment/Exercise - 08/22/15 0001    Knee/Hip Exercises: Aerobic   Stationary Bike 6 mins.  very  slow revolutions; no resistance   Knee/Hip Exercises: Seated   Long Arc Quad AROM;10 reps  not touching foot down in between reps   Shoulder Exercises: Seated   Retraction AROM;Both;10 reps  with elbows bent, hands up   Other Seated Exercises backward shoulder rolls, slowly x 10   Electrical Stimulation   Electrical Stimulation Location mid calf, medial and lateral   Electrical Stimulation Action Premod, one channel on each leg   Electrical Stimulation Parameters 80-150 Hz   Electrical Stimulation Goals Pain   Manual Therapy   Manual Therapy Other (comment)   Other Manual Therapy Very, very gentle soft tissue work to right foot only with Biotone, starting with dorsal aspect, then moving to lateral and medial, and finally plantar surfaces.                PT Education - 08/22/15 1735    Education provided Yes   Education Details suggested patient try soaking feet in warm, not hot water, with feet not touching the bottom of the tub, to see if beneficial   Person(s) Educated Patient   Methods Explanation   Comprehension Verbalized understanding  [facility] Goals - 08/16/15 1229    CC Long Term Goal  #1   Title Patient will be independent in self-management of pain using therapy techniques.   Status On-going   CC Long Term Goal  #2   Title Patient will be knowledgeable about where and how to obtain equipment to help her with pain and mobility (such as a platform walker, TENS unit)   Status On-going            Plan - 08/22/15 1731    Clinical Impression Statement At end of session, patient thought the electrical stim with electrodes placed on her calves MAY have been better than when placed on her back; this will need to be assessed further next time.     Pt will benefit from skilled therapeutic intervention in order to improve on the following deficits Pain;Difficulty walking;Decreased strength;Decreased mobility   Rehab Potential Fair    Clinical Impairments Affecting Rehab Potential CIPN   PT Frequency 2x / week   PT Duration 2 weeks   PT Treatment/Interventions Therapeutic exercise;Manual techniques;Electrical Stimulation   PT Next Visit Plan Assess whether leg or back placement was better for electrical stimulation; assess whether right foot felt any better or worse after treatment that included very gentle soft tissue work with Biotone to right foot only; continue with these; continue with exercise, especially non- or minimal weightbearing type.                                                              Consulted and Agree with Plan of Care Patient        Problem List Patient Active Problem List   Diagnosis Date Noted  . Weakness 04/20/2015  . Thyromegaly 12/14/2014  . Contact dermatitis 12/14/2014  . Colon cancer 11/14/2014  . Rectal bleeding 08/11/2014  . Chest pain 05/16/2014  . SOB (shortness of breath) May 16, 2014  . Loss or death of family member 05-28-13  . Obesity 05/29/2009    Zane Samson 08/22/2015, 5:37 PM  [place], [place], [address] Phone: [phone]   Fax:  [place], PT 08/22/2015 5:37 PM

## 2015-08-24 ENCOUNTER — Ambulatory Visit: Payer: BC Managed Care – PPO | Attending: Hematology

## 2015-08-24 DIAGNOSIS — M79671 Pain in right foot: Secondary | ICD-10-CM

## 2015-08-24 DIAGNOSIS — T451X5A Adverse effect of antineoplastic and immunosuppressive drugs, initial encounter: Secondary | ICD-10-CM

## 2015-08-24 DIAGNOSIS — G622 Polyneuropathy due to other toxic agents: Secondary | ICD-10-CM | POA: Insufficient documentation

## 2015-08-24 DIAGNOSIS — M79672 Pain in left foot: Secondary | ICD-10-CM | POA: Insufficient documentation

## 2015-08-24 DIAGNOSIS — R269 Unspecified abnormalities of gait and mobility: Secondary | ICD-10-CM | POA: Diagnosis present

## 2015-08-24 DIAGNOSIS — G620 Drug-induced polyneuropathy: Secondary | ICD-10-CM

## 2015-08-24 NOTE — Therapy (Signed)
[place], [place], [address] Phone: [phone]   Fax:  [phone]  Physical Therapy Treatment  Patient Details  Name: Jenny [name] MRN: [id number] Date of Birth: Jul 18, 1961 Referring Provider:  Lind Covert, *  Encounter Date: 08/24/2015      PT End of Session - 08/24/15 1108    Visit Number 4   Number of Visits 5   Date for PT Re-Evaluation 09/01/15   PT Start Time 1020   PT Stop Time 1118   PT Time Calculation (min) 58 min   Activity Tolerance Patient limited by pain   Behavior During Therapy [facility] for tasks assessed/performed      Past Medical History  Diagnosis Date  . Enlarged thyroid   . Anemia   . Cancer     colon cancer     Past Surgical History  Procedure Laterality Date  . Tonsillectomy    . Cervical spine surgery  06/17/2012    C5-C7 ACDF  . Cesarean section    . Partial hysterectomy    . Portacath placement Left 12/22/2014    Procedure: INSERTION PORT-A-CATH LEFT SUBCLAVIAN;  Surgeon: [name], MD;  Location: [facility];  Service: General;  Laterality: Left;  . Port-a-cath removal Left 07/28/2015    Procedure: REMOVAL PORT-A-CATH;  Surgeon: [name], MD;  Location: [facility];  Service: General;  Laterality: Left;    There were no vitals filed for this visit.  Visit Diagnosis:  Peripheral neuropathy due to chemotherapy  Abnormality of gait  Pain in both feet      Subjective Assessment - 08/24/15 1021    Subjective Had a really bad dizzy spell yesterday that really scared me. Started feeling bad around 2 and laid down, my husband came home around 5 and he had been calling and I didn't know, so he gave me 3-4 tablespoons of mustard and then it eased off around 545-6 and I just felt weak until about 9. My feet felt so good after I left last time, but they hurt worse than they have in awhile that night I had trouble sleeping, but since then it's been more  tolerable than it's been in awhile!   Currently in Pain? Yes   Pain Score 7    Pain Location Foot  Hands   Pain Orientation Right;Left   Pain Descriptors / Indicators Burning;Tingling   Pain Type Neuropathic pain   Aggravating Factors  too much pressure   Pain Relieving Factors gabapentin, overall the massage last time ended up helping                          [facility] Adult PT Treatment/Exercise - 08/24/15 0001    Knee/Hip Exercises: Aerobic   Stationary Bike 7 mins  Very slow revolutions, no resistance   Knee/Hip Exercises: Seated   Long Arc Quad AROM;10 reps  not touching foot down during reps   Other Seated Knee/Hip Exercises Rocker Board front-back 2 mins    Marching Limitations Able to tolerate 15 each    Electrical Stimulation   Electrical Stimulation Location mid calf, medial and lateral   Electrical Stimulation Action IFC as pt couldn't feel premod today, 1 channel each leg on either side of distal gastrocnemius   Electrical Stimulation Parameters 80-150 Hz    Electrical Stimulation Goals Pain   Manual Therapy   Other Manual Therapy Very, very gentle soft tissue work to both feet today with  Biotone, starting with dorsal aspect, then moving to lateral and medial, and finally plantar surfaces.                        [facility] Goals - 08/24/15 1112    CC Long Term Goal  #1   Title Patient will be independent in self-management of pain using therapy techniques.  Pt reports was bale to rub her feet a little after last treatment   Status On-going   CC Long Term Goal  #2   Title Patient will be knowledgeable about where and how to obtain equipment to help her with pain and mobility (such as a platform walker, TENS unit)   Status On-going            Plan - 08/24/15 1109    Clinical Impression Statement Pt tolerated manual therapy better today, reporting her tnederness much improved and her change in facial expressions was much less  today as well, being able to remain more relaxed. She is pleased with the progress made this week and eager to have more relief,    Pt will benefit from skilled therapeutic intervention in order to improve on the following deficits Pain;Difficulty walking;Decreased strength;Decreased mobility   Rehab Potential Fair   Clinical Impairments Affecting Rehab Potential CIPN   PT Frequency 2x / week   PT Duration 2 weeks   PT Treatment/Interventions Therapeutic exercise;Manual techniques;Electrical Stimulation   PT Next Visit Plan Continue with exercise, focusing on minimal to nonweightbearing ; and cont gentle manual therapy as tolerated by pt to bil feet, maybe try hands in next visit or 2??? and Cont electrical stimulation to bil calves.         Problem List Patient Active Problem List   Diagnosis Date Noted  . Weakness 04/20/2015  . Thyromegaly 12/14/2014  . Contact dermatitis 12/14/2014  . Colon cancer 11/14/2014  . Rectal bleeding 08/11/2014  . Chest pain May 25, 2014  . SOB (shortness of breath) 05/25/2014  . Loss or death of family member 06/06/13  . Obesity 05/29/2009    Otelia Limes, PTA 08/24/2015, 11:16 AM  [place], [place], [address] Phone: [phone]   Fax:  [phone]

## 2015-08-29 ENCOUNTER — Ambulatory Visit: Payer: BC Managed Care – PPO | Admitting: Physical Therapy

## 2015-08-29 DIAGNOSIS — G620 Drug-induced polyneuropathy: Secondary | ICD-10-CM

## 2015-08-29 DIAGNOSIS — M79672 Pain in left foot: Secondary | ICD-10-CM

## 2015-08-29 DIAGNOSIS — G622 Polyneuropathy due to other toxic agents: Secondary | ICD-10-CM | POA: Diagnosis not present

## 2015-08-29 DIAGNOSIS — M79671 Pain in right foot: Secondary | ICD-10-CM

## 2015-08-29 DIAGNOSIS — T451X5A Adverse effect of antineoplastic and immunosuppressive drugs, initial encounter: Principal | ICD-10-CM

## 2015-08-29 NOTE — Patient Instructions (Signed)
Over Head Pull: Narrow Grip     [place] [phone]   On back, knees bent, feet flat, band across thighs, elbows straight but relaxed. Pull hands apart (start). Keeping elbows straight, bring arms up and over head, hands toward floor. Keep pull steady on band. Hold momentarily. Return slowly, keeping pull steady, back to start. Repeat _5-10__ times. Band color _yellow_____   Side Pull: Double Arm   On back, knees bent, feet flat. Arms perpendicular to body, shoulder level, elbows straight but relaxed. Pull arms out to sides, elbows straight. Resistance band comes across collarbones, hands toward floor. Hold momentarily. Slowly return to starting position. Repeat _5-10__ times. Band color _yellow____   Sash   On back, knees bent, feet flat, left hand on left hip, right hand above left. Pull right arm DIAGONALLY (hip to shoulder) across chest. Bring right arm along head toward floor. Hold momentarily. Slowly return to starting position. Repeat __5-10_ times. Do with left arm. Band color __yellow____   Shoulder Rotation: Double Arm   On back, knees bent, feet flat, elbows tucked at sides, bent 90, hands palms up. Pull hands apart and down toward floor, keeping elbows near sides. Hold momentarily. Slowly return to starting position. Repeat __5-10_ times. Band color ___yellow___

## 2015-08-30 NOTE — Therapy (Signed)
[place], [place], [address] Phone: [phone]   Fax:  [phone]  Physical Therapy Treatment  Patient Details  Name: Jenny [name] MRN: [id number] Date of Birth: 16-May-1961 Referring Provider:  Truitt Merle, MD  Encounter Date: 08/29/2015      PT End of Session - 08/30/15 0857    Visit Number 5   Number of Visits 5   Date for PT Re-Evaluation 09/01/15   PT Start Time 5009   PT Stop Time 1523   PT Time Calculation (min) 48 min      Past Medical History  Diagnosis Date  . Enlarged thyroid   . Anemia   . Cancer     colon cancer     Past Surgical History  Procedure Laterality Date  . Tonsillectomy    . Cervical spine surgery  06/17/2012    C5-C7 ACDF  . Cesarean section    . Partial hysterectomy    . Portacath placement Left 12/22/2014    Procedure: INSERTION PORT-A-CATH LEFT SUBCLAVIAN;  Surgeon: Jenny Ruff, MD;  Location: [facility];  Service: General;  Laterality: Left;  . Port-a-cath removal Left 07/28/2015    Procedure: REMOVAL PORT-A-CATH;  Surgeon: Jenny Ruff, MD;  Location: [facility];  Service: General;  Laterality: Left;    There were no vitals filed for this visit.  Visit Diagnosis:  Peripheral neuropathy due to chemotherapy  Pain in both feet      Subjective Assessment - 08/29/15 1436    Subjective (p) has not had much acitivty today.  she feels like the electric stim is helping a little bit.    Pertinent History (p) Colon cancer diagnosed end of September 2015; did surgery in November to remove 12 inches of colon with 28 lymph nodes removed (one positive), stage III.  Adjuvant chemotherapy finished 04/2015.  Had neck disc surgery in approx. 2013 (with plate fusion) with fair outcome.  Has enlarged thyroid that is currentl being worked up.  Trying to adjust to pain from neuropathy.                                           Currently in Pain? (p) Yes   Pain Score (p) 6                           [facility] Adult PT Treatment/Exercise - 08/30/15 0001    Lumbar Exercises: Supine   Other Supine Lumbar Exercises supine scapular series x 5 reps with instruction to activate abdomen as well    Modalities   Modalities Moist Heat   Moist Heat Therapy   Number Minutes Moist Heat 10 Minutes   Moist Heat Location Other (comment)  both feet    Electrical Stimulation   Electrical Stimulation Location attempted electic stim, but not able to get effect today                 PT Education - 08/30/15 0852    Education provided Yes   Education Details supine scapular series with yellow theraband    Person(s) Educated Patient   Methods Explanation;Demonstration;Handout   Comprehension Verbalized understanding;Returned demonstration                [facility] Goals - 08/24/15 1112    CC Long Term Goal  #1  Title Patient will be independent in self-management of pain using therapy techniques.  Pt reports was bale to rub her feet a little after last treatment   Status On-going   CC Long Term Goal  #2   Title Patient will be knowledgeable about where and how to obtain equipment to help her with pain and mobility (such as a platform walker, TENS unit)   Status On-going            Plan - 08/30/15 0900    Clinical Impression Statement Pt had some symptomatic improvement with moist heat and was better able to tolerate manual therapy to feet.  She did well with UE exrcise and was able to activate core. We talked a little about increasing general activity and exrercise.  She is not interested at all in aquatic exercise and is busy with taking care of children. She lives in [place].  Question if she would be able to pariticpate in fhe fitness program at [facility] or other community fitness programs more local to her    Clinical Impairments Affecting Rehab Potential CIPN   PT Next Visit Plan check to see if order received for  TNS unit.  Issue / try TNS unit so pt can be more independent in managing her pain.   Consulted and Agree with Plan of Care Patient        Problem List Patient Active Problem List   Diagnosis Date Noted  . Weakness 04/20/2015  . Thyromegaly 12/14/2014  . Contact dermatitis 12/14/2014  . Colon cancer 11/14/2014  . Rectal bleeding 08/11/2014  . Chest pain May 18, 2014  . SOB (shortness of breath) 05-18-14  . Loss or death of family member May 30, 2013  . Obesity 05/29/2009   Jenny [name], PT 08/30/2015, 9:06 AM  [place], [place], [address] Phone: [phone]   Fax:  [phone]

## 2015-08-31 ENCOUNTER — Ambulatory Visit: Payer: BC Managed Care – PPO | Admitting: Physical Therapy

## 2015-08-31 ENCOUNTER — Encounter: Payer: Self-pay | Admitting: Physical Therapy

## 2015-08-31 DIAGNOSIS — G622 Polyneuropathy due to other toxic agents: Secondary | ICD-10-CM | POA: Diagnosis not present

## 2015-08-31 DIAGNOSIS — M79672 Pain in left foot: Secondary | ICD-10-CM

## 2015-08-31 DIAGNOSIS — R269 Unspecified abnormalities of gait and mobility: Secondary | ICD-10-CM

## 2015-08-31 DIAGNOSIS — G620 Drug-induced polyneuropathy: Secondary | ICD-10-CM

## 2015-08-31 DIAGNOSIS — M79671 Pain in right foot: Secondary | ICD-10-CM

## 2015-08-31 DIAGNOSIS — T451X5A Adverse effect of antineoplastic and immunosuppressive drugs, initial encounter: Principal | ICD-10-CM

## 2015-08-31 NOTE — Therapy (Signed)
[place], [place], [address] Phone: [phone]   Fax:  [phone]  Physical Therapy Treatment  Patient Details  Name: Jenny [name] MRN: [id number] Date of Birth: 06-12-1961 Referring Provider:  Truitt Merle, MD  Encounter Date: 08/31/2015      PT End of Session - 08/31/15 1056    Visit Number 6   Number of Visits 13   Date for PT Re-Evaluation 09/28/15   PT Start Time 6160   PT Stop Time 1102   PT Time Calculation (min) 44 min   Activity Tolerance Patient tolerated treatment well   Behavior During Therapy [facility] for tasks assessed/performed      Past Medical History  Diagnosis Date  . Enlarged thyroid   . Anemia   . Cancer     colon cancer     Past Surgical History  Procedure Laterality Date  . Tonsillectomy    . Cervical spine surgery  06/17/2012    C5-C7 ACDF  . Cesarean section    . Partial hysterectomy    . Portacath placement Left 12/22/2014    Procedure: INSERTION PORT-A-CATH LEFT SUBCLAVIAN;  Surgeon: [name], MD;  [facility];  Service: General;  Laterality: Left;  . Port-a-cath removal Left 07/28/2015    Procedure: REMOVAL PORT-A-CATH;  Surgeon: [name], MD;  [facility];  Service: General;  Laterality: Left;    There were no vitals filed for this visit.  Visit Diagnosis:  Peripheral neuropathy due to chemotherapy - Plan: PT plan of care cert/re-cert  Pain in both feet - Plan: PT plan of care cert/re-cert  Abnormality of gait - Plan: PT plan of care cert/re-cert      Subjective Assessment - 08/31/15 1024    Subjective The stim didn't help much last time but the heat really helped.  No reports of dizziness since last week.  Want to try to the stim again.  I think my feet are about 30% better than when we started.   Pertinent History Colon cancer diagnosed end of September 2015; did surgery in November to remove 12 inches of colon with 28 lymph nodes  removed (one positive), stage III.  Adjuvant chemotherapy finished 04/2015.  Had neck disc surgery in approx. 2013 (with plate fusion) with fair outcome.  Has enlarged thyroid that is currentl being worked up.  Trying to adjust to pain from neuropathy.                                           Patient Stated Goals find out what to do to ease this pain or get rid of it   Currently in Pain? Yes   Pain Score 5    Pain Location Foot   Pain Orientation Right;Left   Pain Descriptors / Indicators Burning   Pain Type Neuropathic pain   Pain Onset More than a month ago   Pain Frequency Constant            [facility] Adult PT Treatment/Exercise - 08/31/15 0001    Modalities   Modalities Moist Heat;Electrical Stimulation   Moist Heat Therapy   Number Minutes Moist Heat 15 Minutes   Moist Heat Location Other (comment)  Bilateral feet   Electrical Stimulation   Electrical Stimulation Location Bilateral plantar feet   Electrical Stimulation Action Pre-mod   Electrical Stimulation Parameters Intensity 5 on left  foot and 12 on right   Electrical Stimulation Goals Pain   Manual Therapy   Soft tissue mobilization Soft tissue work to bilateral plantar feet with Biotone   Other Manual Therapy PROM bilateral ankles all planes in supine to pt tolerance                PT Education - 08/30/15 0852    Education provided Yes   Education Details supine scapular series with yellow theraband    Person(s) Educated Patient   Methods Explanation;Demonstration;Handout   Comprehension Verbalized understanding;Returned demonstration                [facility] Goals - 08/31/15 1100    CC Long Term Goal  #5   Title Patient will be able to walk >/= 20 minutes without rest without holding onto anything.   Time 4   Period Weeks   Status New            Plan - 08/31/15 1157    Clinical Impression Statement Patient was very fearful of having manual therapy on her feet and trying the  electrodes directly on her plantar feet.  However, she tolerated everything very well and surprised herself.  She felt the direct contact with her feet was helpful and reproted pain 3-4/10 after completion of treatment.  She requested to spend the session focused on her feet rather than core strengthening as her goal is to return to work in 1 month and feels her feet are going to prevent that from happening.  We discussed whether to contineu therapy or not and she reported being motivated to continue to be able to return to prior functional level.   Pt will benefit from skilled therapeutic intervention in order to improve on the following deficits Pain;Difficulty walking;Decreased strength;Decreased mobility   Rehab Potential Good   Clinical Impairments Affecting Rehab Potential CIPN   PT Frequency 2x / week   PT Duration 2 weeks   PT Treatment/Interventions Therapeutic exercise;Manual techniques;Electrical Stimulation   PT Next Visit Plan Eval is signed so TENS order is included.  Issue TENS unit next visit.  Continue with focus on reducing pain in feet.  Renewal completed today.   Consulted and Agree with Plan of Care Patient;Family member/caregiver   Family Member Consulted daughter        Problem List Patient Active Problem List   Diagnosis Date Noted  . Weakness 04/20/2015  . Thyromegaly 12/14/2014  . Contact dermatitis 12/14/2014  . Colon cancer 11/14/2014  . Rectal bleeding 08/11/2014  . Chest pain 06-01-14  . SOB (shortness of breath) 2014/06/01  . Loss or death of family member 2013/06/13  . Obesity 05/29/2009   [name], PT 08/31/2015 12:21 PM  [place], [place], [address] Phone: [phone]   Fax:  [phone]

## 2015-09-05 ENCOUNTER — Ambulatory Visit: Payer: BC Managed Care – PPO | Admitting: Physical Therapy

## 2015-09-05 DIAGNOSIS — M79671 Pain in right foot: Secondary | ICD-10-CM

## 2015-09-05 DIAGNOSIS — M79672 Pain in left foot: Secondary | ICD-10-CM

## 2015-09-05 DIAGNOSIS — G622 Polyneuropathy due to other toxic agents: Secondary | ICD-10-CM | POA: Diagnosis not present

## 2015-09-05 DIAGNOSIS — G620 Drug-induced polyneuropathy: Secondary | ICD-10-CM

## 2015-09-05 DIAGNOSIS — T451X5A Adverse effect of antineoplastic and immunosuppressive drugs, initial encounter: Principal | ICD-10-CM

## 2015-09-05 NOTE — Patient Instructions (Signed)
Try to use the TENS unit for about 30 minutes a day.  Before you apply the electrodes, check to make sure the unit is at 0 for each channel Remove elecrodes from paper and apply to bottom of feet, 2 to each foot that are attached to the same wire.  If the electodes don't stick, place a couple of drops of water to electode and let it dry for few seconds.  It should get a little sticky and then it is ok to apply to foot.  Once the electrodes are on, use the arrows to increase the intesity of each side to your comfort.  You may have to increase the intensity after a few minutes.   I will submit the paperwork to you insurance company and [employer] will call you to let you know your coverage.  If you decide you do not want to keep the unit, put EVERYTHING back in the envelope that is in the black case and drop it in the mailbox.  Keep a copy of the paperwork with the date you sent it back for your records.

## 2015-09-05 NOTE — Therapy (Signed)
[place], [place], [address] Phone: [phone]   Fax:  [phone]  Physical Therapy Treatment  Patient Details  Name: Jenny [name] MRN: [id number] Date of Birth: 04-07-1961 Referring Provider:  Truitt Merle, MD  Encounter Date: 09/05/2015      PT End of Session - 09/05/15 1530    Visit Number 7   Number of Visits 13   Date for PT Re-Evaluation 09/28/15   PT Start Time 1440   PT Stop Time 1520   PT Time Calculation (min) 40 min   Activity Tolerance Patient tolerated treatment well   Behavior During Therapy [facility] for tasks assessed/performed      Past Medical History  Diagnosis Date  . Enlarged thyroid   . Anemia   . Cancer     colon cancer     Past Surgical History  Procedure Laterality Date  . Tonsillectomy    . Cervical spine surgery  06/17/2012    C5-C7 ACDF  . Cesarean section    . Partial hysterectomy    . Portacath placement Left 12/22/2014    Procedure: INSERTION PORT-A-CATH LEFT SUBCLAVIAN;  Surgeon: [name], MD;  Location: [facility];  Service: General;  Laterality: Left;  . Port-a-cath removal Left 07/28/2015    Procedure: REMOVAL PORT-A-CATH;  Surgeon: [name], MD;  Location: [facility];  Service: General;  Laterality: Left;    There were no vitals filed for this visit.  Visit Diagnosis:  Peripheral neuropathy due to chemotherapy  Pain in both feet      Subjective Assessment - 09/05/15 1441    Subjective pt states that she is feeling better    Pertinent History Colon cancer diagnosed end of September 2015; did surgery in November to remove 12 inches of colon with 28 lymph nodes removed (one positive), stage III.  Adjuvant chemotherapy finished 04/2015.  Had neck disc surgery in approx. 2013 (with plate fusion) with fair outcome.  Has enlarged thyroid that is currentl being worked up.  Trying to adjust to pain from neuropathy.                                           Patient Stated Goals find out what to do to ease this pain or get rid of it   Currently in Pain? Yes   Pain Score 5    Pain Location Foot   Pain Orientation Right;Left   Pain Descriptors / Indicators Burning   Pain Type Neuropathic pain   Pain Onset More than a month ago   Pain Frequency Constant   Pain Relieving Factors medication                          [facility] Adult PT Treatment/Exercise - 09/05/15 0001    Self-Care   Other Self-Care Comments  Issued TENS unit for home.  Pt instructed in how to  apply electrodes, turn unit on and adjust intesity.   Pt asked to use    Moist Heat Therapy   Number Minutes Moist Heat 15 Minutes  bilateral feet   Electrical Stimulation   Electrical Stimulation Location Bilateral plantar feet   Electrical Stimulation Action issued TENS unit and went though the various porgrams. Pt chose Modulated 1 and was able to self regulate the intesity of stimulation.  she chose intensity 5 for  left foot ( increased to level 9 after a few minutes)  , and intesity 10 for left foot    Electrical Stimulation Goals Pain                PT Education - 09/05/15 1528    Education provided Yes   Education Details TNS application, use and return policy   Person(s) Educated Patient   Methods Explanation;Demonstration;Handout   Comprehension Verbalized understanding                [place] - 08/31/15 1100    CC Long Term Goal  #5   Title Patient will be able to walk >/= 20 minutes without rest without holding onto anything.   Time 4   Period Weeks   Status New            Plan - 09/05/15 1531    Clinical Impression Statement Pt continues to have pain in both her feet, but feels that overall, she has had improvement.  She wants to try the TNS unit to see if it will help her in the long run, but she continued to have pain today after its use.  Moist heat packs were applied to both feet at end of session as pt received  symptomatic relief from them.  pt asked to bring TENS unit back with her next session   Pt will benefit from skilled therapeutic intervention in order to improve on the following deficits Pain;Difficulty walking;Decreased strength;Decreased mobility   Rehab Potential Good   Clinical Impairments Affecting Rehab Potential CIPN   PT Next Visit Plan Review application and adjustment of TENS to see if pt independent in its use and assess effectiveness.  Consider moist heat and soft tissue mobilizaion with ankle exercise if able    Consulted and Agree with Plan of Care Patient        Problem List Patient Active Problem List   Diagnosis Date Noted  . Weakness 04/20/2015  . Thyromegaly 12/14/2014  . Contact dermatitis 12/14/2014  . Colon cancer 11/14/2014  . Rectal bleeding 08/11/2014  . Chest pain 2014/05/17  . SOB (shortness of breath) May 17, 2014  . Loss or death of family member 29-May-2013  . Obesity 05/29/2009   Donato Heinz. Owens Shark, PT  09/05/2015, 3:37 PM  [place], [place], [address] Phone: [phone]   Fax:  [phone]

## 2015-09-07 ENCOUNTER — Ambulatory Visit: Payer: BC Managed Care – PPO

## 2015-09-07 DIAGNOSIS — M79672 Pain in left foot: Secondary | ICD-10-CM

## 2015-09-07 DIAGNOSIS — T451X5A Adverse effect of antineoplastic and immunosuppressive drugs, initial encounter: Principal | ICD-10-CM

## 2015-09-07 DIAGNOSIS — G622 Polyneuropathy due to other toxic agents: Secondary | ICD-10-CM | POA: Diagnosis not present

## 2015-09-07 DIAGNOSIS — G620 Drug-induced polyneuropathy: Secondary | ICD-10-CM

## 2015-09-07 DIAGNOSIS — R269 Unspecified abnormalities of gait and mobility: Secondary | ICD-10-CM

## 2015-09-07 DIAGNOSIS — M79671 Pain in right foot: Secondary | ICD-10-CM

## 2015-09-07 NOTE — Patient Instructions (Signed)
Starting position: Always have toes pointed at ceiling, then make sure to not roll leg with any side-side motions. Ankle Inversion: Long-Sitting (Single Leg)   Tubing around forefoot, on same side as anchor, rotate ankle, pointing toes inward. Repeat 5-10__ times per set. Repeat with other leg. Do _1-2_ sets per session. 1x/day if able.  http://tub.exer.us/220   Copyright  VHI. All rights reserved.   Ankle Eversion: Long-Sitting   Have someone hold tubing horizontal and do one foot at a time keeping toes pointed inward. Rotate ankles, pointing toes outward. Repeat 5-10__ times per set. Do _1-2_ sets per session. 1x/day if able.  http://tub.exer.us/218   Copyright  VHI. All rights reserved.    Plantar Flexion: Resisted   Anchor behind, tubing around left foot, press down. Repeat _5-10___ times per set. Do _1-2___ sets per session. Do __1__ sessions per day.  http://orth.exer.us/10   Copyright  VHI. All rights reserved.  Dorsiflexion: Resisted   Facing anchor, tubing around left foot, pull toward face.  Repeat _5-10___ times per set. Do __1-2__ sets per session. Do __1__ sessions per day.  http://orth.exer.us/8   Copyright  VHI. All rights reserved.   Cancer Rehab [phone]

## 2015-09-07 NOTE — Therapy (Signed)
[place], [place], [address] Phone: [phone]   Fax:  [phone]  Physical Therapy Treatment  Patient Details  Name: Jenny [name] MRN: [id number] Date of Birth: [birth year] Referring Provider:  Truitt Merle, MD  Encounter Date: 09/07/2015      PT End of Session - 09/07/15 1012    Visit Number 8   Number of Visits 13   Date for PT Re-Evaluation 09/28/15   PT Start Time 0931   PT Stop Time 1015   PT Time Calculation (min) 44 min   Activity Tolerance Patient tolerated treatment well   Behavior During Therapy [facility] for tasks assessed/performed      Past Medical History  Diagnosis Date  . Enlarged thyroid   . Anemia   . Cancer     colon cancer     Past Surgical History  Procedure Laterality Date  . Tonsillectomy    . Cervical spine surgery  06/17/2012    C5-C7 ACDF  . Cesarean section    . Partial hysterectomy    . Portacath placement Left 12/22/2014    Procedure: INSERTION PORT-A-CATH LEFT SUBCLAVIAN;  Surgeon: [name], MD;  Location: [facility];  Service: General;  Laterality: Left;  . Port-a-cath removal Left 07/28/2015    Procedure: REMOVAL PORT-A-CATH;  Surgeon: [name], MD;  Location: [facility];  Service: General;  Laterality: Left;    There were no vitals filed for this visit.  Visit Diagnosis:  Peripheral neuropathy due to chemotherapy  Pain in both feet  Abnormality of gait      Subjective Assessment - 09/07/15 0933    Subjective I used the TENS one time since I came and it was helpful, especially a little while after. Had relief for about 2-2.5 hours with pain down to a 2/10.    Currently in Pain? Yes   Pain Score 6    Pain Location Foot   Pain Orientation Right;Left   Pain Descriptors / Indicators Burning;Tingling   Pain Onset More than a month ago   Pain Frequency Constant   Aggravating Factors  Worse in the mornings, standing on them. Thats why pain is up to a  6/10 right now, bc its still early. And after sitting for awhile then stand up.   Pain Relieving Factors TENS unit, meds                         [facility] Adult PT Treatment/Exercise - 09/07/15 0001    Knee/Hip Exercises: Seated   Other Seated Knee/Hip Exercises Rocker board side-side x2 minutes   Moist Heat Therapy   Number Minutes Moist Heat 20 Minutes  1 channel each with TENS   Moist Heat Location Other (comment)  Bil plantar feet   Electrical Stimulation   Electrical Stimulation Location Bilateral plantar feet   Electrical Stimulation Action Modulated II with reviewing pt TENS unit while using   Electrical Stimulation Parameters 28 on lt and 11 on rt with Modulated II setting with pts TENS unit.   Electrical Stimulation Goals Pain   Ankle Exercises: Supine   T-Band Yellow theraband for bil ankle 4 ways 5 reps each with moderate verbal and tactile cuing throughout for correct ankle motion and not to compensate with hip, 1 rest break for each foot during                PT Education - 09/07/15 1012    Education provided Yes  Education Details Ankle 4 way with theraband in supine   Person(s) Educated Patient   Methods Explanation;Demonstration;Handout   Comprehension Verbalized understanding;Returned demonstration;Need further instruction                [facility] Goals - 09/07/15 1016    CC Long Term Goal  #1   Title Patient will be independent in self-management of pain using therapy techniques.  Pt doing well with TENS unit use and is able to massage her feet for symptomatic relief as well now.   Status Achieved   CC Long Term Goal  #2   Title Patient will be knowledgeable about where and how to obtain equipment to help her with pain and mobility (such as a platform walker, TENS unit)   Status Partially Met   CC Long Term Goal  #3   Title [name]    Status On-going   CC Long Term Goal  #4   Title Patient will be able to do grocery shopping for >/= 45 minutes while pushing a cart.  Able to walk about 15-18 mins now.   Status On-going   CC Long Term Goal  #5   Title Patient will be able to walk >/= 20 minutes without rest without holding onto anything.  Able to walk about 15-18 mins now.   Status On-going            Plan - 09/07/15 1012    Clinical Impression Statement Pt reported that she had relief for about 2-2.5 hours with pain decreased to 2/10 during that time after using TENS unit at home since last visit. She is very pleased with effects of TENS and felt like it was even more beneficial to do heat and TENS at same time today.  Pt doing well already with use of her TENS unit. Pt had reduction of pain to 4/10 after therapy and is progressing towards goals.    Pt will benefit from skilled therapeutic intervention in order to improve on the following deficits Pain;Difficulty walking;Decreased strength;Decreased mobility   Rehab Potential Good   Clinical Impairments Affecting Rehab Potential CIPN   PT Frequency 2x / week   PT Duration 2 weeks   PT Treatment/Interventions Therapeutic exercise;Manual techniques;Electrical Stimulation   PT Next Visit Plan Review ankle HEP issued today to assess correct technique. Cont moist heat with electrical stimulation and manual techniques as time allows. Progress exercises as pt tolerates.    PT Home Exercise Plan TENS unit with or without heat (but heat for short period of time) and ankle theraband strength   Consulted and Agree with Plan of Care Patient        Problem List Patient Active Problem List   Diagnosis Date Noted  . Weakness 04/20/2015  . Thyromegaly 12/14/2014  . Contact dermatitis 12/14/2014  . Colon cancer 11/14/2014  . Rectal bleeding 08/11/2014  . Chest pain 05-30-14  . SOB (shortness of breath) May 30, 2014  . Loss or death of family member June 11, 2013  . Obesity  05/29/2009    Otelia Limes, PTA 09/07/2015, 10:21 AM  [place], [place], [address] Phone: [phone]   Fax:  [phone]

## 2015-09-11 ENCOUNTER — Ambulatory Visit: Payer: BC Managed Care – PPO

## 2015-09-11 DIAGNOSIS — G620 Drug-induced polyneuropathy: Secondary | ICD-10-CM

## 2015-09-11 DIAGNOSIS — R269 Unspecified abnormalities of gait and mobility: Secondary | ICD-10-CM

## 2015-09-11 DIAGNOSIS — G622 Polyneuropathy due to other toxic agents: Secondary | ICD-10-CM | POA: Diagnosis not present

## 2015-09-11 DIAGNOSIS — M79671 Pain in right foot: Secondary | ICD-10-CM

## 2015-09-11 DIAGNOSIS — T451X5A Adverse effect of antineoplastic and immunosuppressive drugs, initial encounter: Secondary | ICD-10-CM

## 2015-09-11 DIAGNOSIS — M79672 Pain in left foot: Secondary | ICD-10-CM

## 2015-09-11 NOTE — Therapy (Signed)
[place], [place], [address] Phone: [phone]   Fax:  [phone]  Physical Therapy Treatment  Patient Details  Name: Jenny [name] MRN: [id number] Date of Birth: 12/03/1961 Referring Provider:  Truitt Merle, MD  Encounter Date: 09/11/2015      PT End of Session - 09/11/15 1101    Visit Number 9   Number of Visits 13   Date for PT Re-Evaluation 09/28/15   PT Start Time 9030   PT Stop Time 1111   PT Time Calculation (min) 56 min   Activity Tolerance Patient tolerated treatment well   Behavior During Therapy [facility] for tasks assessed/performed      Past Medical History  Diagnosis Date  . Enlarged thyroid   . Anemia   . Cancer     colon cancer     Past Surgical History  Procedure Laterality Date  . Tonsillectomy    . Cervical spine surgery  06/17/2012    C5-C7 ACDF  . Cesarean section    . Partial hysterectomy    . Portacath placement Left 12/22/2014    Procedure: INSERTION PORT-A-CATH LEFT SUBCLAVIAN;  Surgeon: [name], MD;  Location: [facility];  Service: General;  Laterality: Left;  . Port-a-cath removal Left 07/28/2015    Procedure: REMOVAL PORT-A-CATH;  Surgeon: [name], MD;  Location: [facility];  Service: General;  Laterality: Left;    There were no vitals filed for this visit.  Visit Diagnosis:  Pain in both feet  Peripheral neuropathy due to chemotherapy  Abnormality of gait      Subjective Assessment - 09/11/15 1023    Subjective I had 2 bad episodes since I was here last just from pushing myself too much. I know I feel better so when I try to do things I just need to find a balance, I'm doing too much. Feel better now than I did last night, but I used the TENS and pain meds so I feel better this morning.    Currently in Pain? Yes   Pain Score 6    Pain Location Foot  Worse in the heel    Pain Orientation Right;Left   Pain Descriptors / Indicators Burning;Tingling   Pain Type Neuropathic pain   Aggravating Factors  Just doing too much bc I've been feeling better   Pain Relieving Factors TENS unit and pain meds                         [facility] Adult PT Treatment/Exercise - 09/11/15 0001    Knee/Hip Exercises: Aerobic   Nustep Level 1 x7 minutes with UE support #10   Knee/Hip Exercises: Standing   Gait Training In // bars: Heel-toe pattern 2 reps, pt presented with very jerky whole body movements while trying to perform this but reported she used to experiencie this early on with chemo to the point of unable to get out of bed. She was not concerned about it and it appeared to be due to challenging her balance. Then high knee marching 1 rep. 2 seated rest required during then finished with supine exercises.    Knee/Hip Exercises: Supine   Short Arc Quad Sets Strengthening;Both;1 set;10 reps  1 lb each ankle    Short Arc Quad Sets Limitations Wrapped pillowcase around Rt ankle to help counteract hypersensitivity.   Moist Heat Therapy   Number Minutes Moist Heat 15 Minutes  During TENS   Moist Heat Location Other (  comment)  Bil plantar feet   Electrical Stimulation   Electrical Stimulation Location Bilateral plantar feet   Electrical Stimulation Action Modulated II of pts TENS unit   Electrical Stimulation Parameters 6 on Lt and 21 on Rt today for 15 minutes.   Electrical Stimulation Goals Pain                        [facility] Goals - 09/07/15 1016    CC Long Term Goal  #1   Title Patient will be independent in self-management of pain using therapy techniques.  Pt doing well with TENS unit use and is able to massage her feet for symptomatic relief as well now.   Status Achieved   CC Long Term Goal  #2   Title Patient will be knowledgeable about where and how to obtain equipment to help her with pain and mobility (such as a platform walker, TENS unit)   Status Partially Met   CC Long Term Goal  #3   Title  [name]   Status On-going   CC Long Term Goal  #4   Title Patient will be able to do grocery shopping for >/= 45 minutes while pushing a cart.  Able to walk about 15-18 mins now.   Status On-going   CC Long Term Goal  #5   Title Patient will be able to walk >/= 20 minutes without rest without holding onto anything.  Able to walk about 15-18 mins now.   Status On-going            Plan - 09/11/15 1101    Clinical Impression Statement Pt tolerated standing activities very well today with pain increasing only from a 6/10 to a 7/10. Ended with moist heat and TENS to bil plantar feet. Pt reports she is starting to be able to do more, just needs to find a balance bc she ends up doing too much and then paying late at night with increased pain.    Pt will benefit from skilled therapeutic intervention in order to improve on the following deficits Pain;Difficulty walking;Decreased strength;Decreased mobility   Rehab Potential Good   Clinical Impairments Affecting Rehab Potential CIPN   PT Frequency 2x / week   PT Duration 8 weeks   PT Treatment/Interventions Therapeutic exercise;Manual techniques;Electrical Stimulation   PT Next Visit Plan Continue with balance activities as pts pain allows and cont with modalities to help decrease pain at end of treatment.    Consulted and Agree with Plan of Care Patient        Problem List Patient Active Problem List   Diagnosis Date Noted  . Weakness 04/20/2015  . Thyromegaly 12/14/2014  . Contact dermatitis 12/14/2014  . Colon cancer 11/14/2014  . Rectal bleeding 08/11/2014  . Chest pain 2014/05/19  . SOB (shortness of breath) 2014/05/19  . Loss or death of family member 05-31-2013  . Obesity 05/29/2009    Otelia Limes, PTA 09/11/2015, 12:05 PM  [place], [place],  [address] Phone: [phone]   Fax:  [phone]

## 2015-09-13 ENCOUNTER — Ambulatory Visit: Payer: BC Managed Care – PPO | Admitting: Physical Therapy

## 2015-09-13 DIAGNOSIS — M79671 Pain in right foot: Secondary | ICD-10-CM

## 2015-09-13 DIAGNOSIS — G620 Drug-induced polyneuropathy: Secondary | ICD-10-CM

## 2015-09-13 DIAGNOSIS — M79672 Pain in left foot: Secondary | ICD-10-CM

## 2015-09-13 DIAGNOSIS — T451X5A Adverse effect of antineoplastic and immunosuppressive drugs, initial encounter: Secondary | ICD-10-CM

## 2015-09-13 DIAGNOSIS — R269 Unspecified abnormalities of gait and mobility: Secondary | ICD-10-CM

## 2015-09-13 DIAGNOSIS — G622 Polyneuropathy due to other toxic agents: Secondary | ICD-10-CM | POA: Diagnosis not present

## 2015-09-13 NOTE — Therapy (Signed)
[place], [place], [address] Phone: [phone]   Fax:  [phone]  Physical Therapy Treatment  Patient Details  Name: Jenny [name] MRN: [id number] Date of Birth: September 18, 1961 Referring Provider:  Truitt Merle, MD  Encounter Date: 09/13/2015      PT End of Session - 09/13/15 1720    Visit Number 10   Number of Visits 13   Date for PT Re-Evaluation 09/28/15   PT Start Time 8341   PT Stop Time 1539   PT Time Calculation (min) 55 min   Activity Tolerance Patient tolerated treatment well   Behavior During Therapy [facility] for tasks assessed/performed      Past Medical History  Diagnosis Date  . Enlarged thyroid   . Anemia   . Cancer     colon cancer     Past Surgical History  Procedure Laterality Date  . Tonsillectomy    . Cervical spine surgery  06/17/2012    C5-C7 ACDF  . Cesarean section    . Partial hysterectomy    . Portacath placement Left 12/22/2014    Procedure: INSERTION PORT-A-CATH LEFT SUBCLAVIAN;  Surgeon: [name], MD;  Location: [facility];  Service: General;  Laterality: Left;  . Port-a-cath removal Left 07/28/2015    Procedure: REMOVAL PORT-A-CATH;  Surgeon: [name], MD;  Location: [facility];  Service: General;  Laterality: Left;    There were no vitals filed for this visit.  Visit Diagnosis:  Pain in both feet  Peripheral neuropathy due to chemotherapy  Abnormality of gait      Subjective Assessment - 09/13/15 1448    Subjective "I'm doing a lot better."   Currently in Pain? Yes   Pain Score 6    Pain Location Foot   Pain Orientation Right;Left   Pain Descriptors / Indicators Burning;Tingling   Pain Type Neuropathic pain   Aggravating Factors  overdoing things   Pain Relieving Factors TENS, hot packs, pain meds                         [facility] Adult PT Treatment/Exercise - 09/13/15 0001    Ambulation/Gait   Ambulation/Gait Yes   Ambulation  Distance (Feet) 200 Feet  + 250   Knee/Hip Exercises: Aerobic   Nustep Level 1 x7 minutes with UE support #10  assessment, encouragement, and monitoring concurrently   Knee/Hip Exercises: Standing   Gait Training In parallel bars, mostly with one UE support, tandem gait x 2 lengths; then high marches x 2 lengths with one UE support.   Moist Heat Therapy   Number Minutes Moist Heat 18 Minutes   Moist Heat Location Other (comment)  each foot with cervical hot pack around it   Electrical Stimulation   Electrical Stimulation Location Bilateral plantar feet   Electrical Stimulation Action Pre-mod setting of clinic electrical stimulation   Electrical Stimulation Parameters 80-150 Hz x 20 mins.   Electrical Stimulation Goals Pain                        [facility] Goals - 09/13/15 1726    CC Long Term Goal  #2   Title Patient will be knowledgeable about where and how to obtain equipment to help her with pain and mobility (such as a platform walker, TENS unit)   Status Partially Met   CC Long Term Goal  #3   Title Patient will be able  to return to work at beginning of October with reports of pain </= 3/10.   Status On-going   CC Long Term Goal  #4   Title Patient will be able to do grocery shopping for >/= 45 minutes while pushing a cart.   Status On-going   CC Long Term Goal  #5   Title Patient will be able to walk >/= 20 minutes without rest without holding onto anything.   Status On-going            Plan - 09/13/15 1721    Clinical Impression Statement This therapist had not treated this patient in a couple of weeks and could see a marked improvement in her gait, with increased speed, greater ease, and decreased discomfort.  Still with peculiar motor response (ataxia, slow pace) when asked to tandem walk or do high marching in parallel bars.                                                                                                        Pt will benefit  from skilled therapeutic intervention in order to improve on the following deficits Pain;Difficulty walking;Decreased strength;Decreased mobility   Rehab Potential Good   Clinical Impairments Affecting Rehab Potential CIPN   PT Frequency 2x / week   PT Duration 8 weeks   PT Treatment/Interventions Therapeutic exercise;Neuromuscular re-education;Electrical Stimulation   PT Next Visit Plan Continue with balance activities as pt.'s pain allows and cont with modalities to help decrease pain at end of treatment.    Consulted and Agree with Plan of Care Patient        Problem List Patient Active Problem List   Diagnosis Date Noted  . Weakness 04/20/2015  . Thyromegaly 12/14/2014  . Contact dermatitis 12/14/2014  . Colon cancer 11/14/2014  . Rectal bleeding 08/11/2014  . Chest pain 05-19-14  . SOB (shortness of breath) May 19, 2014  . Loss or death of family member 05-31-2013  . Obesity 05/29/2009    SALIS[place],DONNA 09/13/2015, 5:28 PM  [place], [place], [address] Phone: [phone]   Fax:  Annetta, PT 09/13/2015 5:28 PM

## 2015-09-19 ENCOUNTER — Ambulatory Visit: Payer: BC Managed Care – PPO | Admitting: Physical Therapy

## 2015-09-21 ENCOUNTER — Ambulatory Visit: Payer: BC Managed Care – PPO | Admitting: Physical Therapy

## 2015-09-21 DIAGNOSIS — T451X5A Adverse effect of antineoplastic and immunosuppressive drugs, initial encounter: Secondary | ICD-10-CM

## 2015-09-21 DIAGNOSIS — M79672 Pain in left foot: Secondary | ICD-10-CM

## 2015-09-21 DIAGNOSIS — M79671 Pain in right foot: Secondary | ICD-10-CM

## 2015-09-21 DIAGNOSIS — G622 Polyneuropathy due to other toxic agents: Secondary | ICD-10-CM | POA: Diagnosis not present

## 2015-09-21 DIAGNOSIS — G620 Drug-induced polyneuropathy: Secondary | ICD-10-CM

## 2015-09-21 DIAGNOSIS — R269 Unspecified abnormalities of gait and mobility: Secondary | ICD-10-CM

## 2015-09-21 NOTE — Therapy (Signed)
[place], [place], [address] Phone: [phone]   Fax:  [phone]  Physical Therapy Treatment  Patient Details  Name: Jenny [name] MRN: [id number] Date of Birth: 11/04/1961 Referring Provider:  Truitt Merle, MD  Encounter Date: 09/21/2015      PT End of Session - 09/21/15 1705    Visit Number 11   Number of Visits 13   Date for PT Re-Evaluation 09/28/15   PT Start Time 8676   PT Stop Time 1615   PT Time Calculation (min) 60 min   Activity Tolerance Patient tolerated treatment well   Behavior During Therapy [facility] for tasks assessed/performed      Past Medical History  Diagnosis Date  . Enlarged thyroid   . Anemia   . Cancer     colon cancer     Past Surgical History  Procedure Laterality Date  . Tonsillectomy    . Cervical spine surgery  06/17/2012    C5-C7 ACDF  . Cesarean section    . Partial hysterectomy    . Portacath placement Left 12/22/2014    Procedure: INSERTION PORT-A-CATH LEFT SUBCLAVIAN;  Surgeon: [name], MD;  Location: [facility];  Service: General;  Laterality: Left;  . Port-a-cath removal Left 07/28/2015    Procedure: REMOVAL PORT-A-CATH;  Surgeon: [name], MD;  Location: [facility];  Service: General;  Laterality: Left;    There were no vitals filed for this visit.  Visit Diagnosis:  Pain in both feet  Peripheral neuropathy due to chemotherapy  Abnormality of gait      Subjective Assessment - 09/21/15 1519    Subjective Doing okay, a little tired, a little pain  she brings her TNS unit in today and states she is using it at home and gets relief from it. She uses it about every other day. She says at time she is too tired to use it and she needs someone to help her    Pertinent History Colon cancer diagnosed end of September 2015; did surgery in November to remove 12 inches of colon with 28 lymph nodes removed (one positive), stage III.  Adjuvant chemotherapy  finished 04/2015.  Had neck disc surgery in approx. 2013 (with plate fusion) with fair outcome.  Has enlarged thyroid that is currentl being worked up.  Trying to adjust to pain from neuropathy.                                           Patient Stated Goals find out what to do to ease this pain or get rid of it   Currently in Pain? Yes   Pain Score 5    Pain Location Foot   Pain Orientation Right;Left  about the same on both feet   Pain Descriptors / Indicators Burning;Tingling   Pain Type Neuropathic pain   Pain Relieving Factors keeping her feet warm, thick socks, TENS cushioned shoes    Effect of Pain on Daily Activities llimits daily activities                          [facility] Adult PT Treatment/Exercise - 09/21/15 0001    Knee/Hip Exercises: Standing   Forward Step Up Step Height: 6"  6 reps with each foot   Other Standing Knee Exercises moving arms around in different directions, while maintaining  balance and core activation    Other Standing Knee Exercises on blue foam, needed one arm on support and concentration on visual focal point , practiced arm movement, head turns, glue sets    Knee/Hip Exercises: Seated   Other Seated Knee/Hip Exercises toe taps onto 4" step, full steponto step. heel and toe raises, arm range of motion and bilateral hands clasped hip to hip    Moist Heat Therapy   Number Minutes Moist Heat 10 Minutes   Moist Heat Location Other (comment)  both feet    Electrical Stimulation   Electrical Stimulation Location Bilateral plantar feet   Electrical Stimulation Action pre-,mod   Electrical Stimulation Parameters 80-150 HZ x 20 minutes. intesiity at 10-12   Electrical Stimulation Goals Pain                        Long Term Clinic Goals - 09/13/15 1726    CC Long Term Goal  #2   Title Patient will be knowledgeable about where and how to obtain equipment to help her with pain and mobility (such as a platform walker, TENS unit)    Status Partially Met   CC Long Term Goal  #3   Title [name]   Status On-going   CC Long Term Goal  #4   Title Patient will be able to do grocery shopping for >/= 45 minutes while pushing a cart.   Status On-going   CC Long Term Goal  #5   Title Patient will be able to walk >/= 20 minutes without rest without holding onto anything.   Status On-going            Plan - 09/21/15 1706    Clinical Impression Statement pt continues to have difficulty with ambulation and pain in feet, but was able to participate with PT and do upgraded balance work on foam and use vision and core activation for stability to help her. she continues to get some temporary relief from foot pain with heat and electricity   Clinical Impairments Affecting Rehab Potential CIPN   PT Next Visit Plan Continue with balance activities as pt.'s pain allows and cont with modalities to help decrease pain at end of treatment.    Consulted and Agree with Plan of Care Patient        Problem List Patient Active Problem List   Diagnosis Date Noted  . Weakness 04/20/2015  . Thyromegaly 12/14/2014  . Contact dermatitis 12/14/2014  . Colon cancer 11/14/2014  . Rectal bleeding 08/11/2014  . Chest pain 05-15-14  . SOB (shortness of breath) May 15, 2014  . Loss or death of family member 05-27-2013  . Obesity 05/29/2009   Donato Heinz. Owens Shark PT   Norwood Levo 09/21/2015, 5:10 PM  [place], [place], [address] Phone: [phone]   Fax:  [phone]

## 2015-09-26 ENCOUNTER — Ambulatory Visit: Payer: BC Managed Care – PPO | Attending: Hematology | Admitting: Physical Therapy

## 2015-09-26 DIAGNOSIS — G620 Drug-induced polyneuropathy: Secondary | ICD-10-CM

## 2015-09-26 DIAGNOSIS — R269 Unspecified abnormalities of gait and mobility: Secondary | ICD-10-CM | POA: Insufficient documentation

## 2015-09-26 DIAGNOSIS — M79671 Pain in right foot: Secondary | ICD-10-CM | POA: Diagnosis not present

## 2015-09-26 DIAGNOSIS — M79672 Pain in left foot: Secondary | ICD-10-CM | POA: Insufficient documentation

## 2015-09-26 DIAGNOSIS — T451X5A Adverse effect of antineoplastic and immunosuppressive drugs, initial encounter: Secondary | ICD-10-CM | POA: Diagnosis present

## 2015-09-26 NOTE — Therapy (Signed)
[place], [place], [address] Phone: [phone]   Fax:  [phone]  Physical Therapy Treatment  Patient Details  Name: Jenny [name] MRN: [id number] Date of Birth: April 08, 1961 Referring Provider:  Truitt Merle, MD  Encounter Date: 09/26/2015      PT End of Session - 09/26/15 1528    Visit Number 12   Number of Visits 13   Date for PT Re-Evaluation 09/28/15   PT Start Time 8032   PT Stop Time 1540   PT Time Calculation (min) 67 min   Activity Tolerance Patient tolerated treatment well  but limited by ataxic tremors   Behavior During Therapy [facility] for tasks assessed/performed      Past Medical History  Diagnosis Date  . Enlarged thyroid   . Anemia   . Cancer     colon cancer     Past Surgical History  Procedure Laterality Date  . Tonsillectomy    . Cervical spine surgery  06/17/2012    C5-C7 ACDF  . Cesarean section    . Partial hysterectomy    . Portacath placement Left 12/22/2014    Procedure: INSERTION PORT-A-CATH LEFT SUBCLAVIAN;  Surgeon: [name], MD;  [facility];  Service: General;  Laterality: Left;  . Port-a-cath removal Left 07/28/2015    Procedure: REMOVAL PORT-A-CATH;  Surgeon: [name], MD;  [facility];  Service: General;  Laterality: Left;    There were no vitals filed for this visit.  Visit Diagnosis:  Pain in both feet  Peripheral neuropathy due to chemotherapy [facility])  Abnormality of gait      Subjective Assessment - 09/26/15 1434    Subjective Doing a little better.  Most of the pain is on the side and the heel of the foot on the bottom--not as severe on the ball.   Currently in Pain? Yes   Pain Score 5   balls of feet at 3/10   Pain Location Heel   Pain Orientation Right;Left   Aggravating Factors  air conditioning really bothers her ankles, making them feel like they're detached   Pain Relieving Factors keeping ankles warm; TENS unit helps  some                         [facility] Adult PT Treatment/Exercise - 09/26/15 0001    Neuro Re-ed    Neuro Re-ed Details  Tried walking sideways and then forwards after doing other balance exercise, but LE tremor kicked up and patient was unable to proceed.   Knee/Hip Exercises: Standing   Other Standing Knee Exercises standing on blue foam pad maintaining balance with eyes open and eyes closed x approx. 2 mins.; then with eyes open and arms in scaption with flutters; then with alternating shoulder flexion/extension.  Pt. with ataxia as she tried to perform these.   Other Standing Knee Exercises standing in corner with one foot forward, head turns and nods, done with right forward and left forward some; then standing with feet close together--pt. with severe ataxia/tremor in legs with trying this, so she had difficulty stepping out of it   Knee/Hip Exercises: Seated   Long Arc Quad AROM;15 reps;Right;Left;1 set   Knee/Hip Flexion seated SLR x 10 Left and right   Marching Limitations 30 times   Moist Heat Therapy   Number Minutes Moist Heat 20 Minutes   Moist Heat Location Other (comment)  both feet    Electrical Stimulation  Electrical Stimulation Location Bilateral plantar feet   Electrical Stimulation Action Pre-mod setting of clinic electrical stimulation   Electrical Stimulation Parameters 80-150 Hz. x 20 mins.   Electrical Stimulation Goals Pain                        Long Term Clinic Goals - 09/13/15 1726    CC Long Term Goal  #2   Title Patient will be knowledgeable about where and how to obtain equipment to help her with pain and mobility (such as a platform walker, TENS unit)   Status Partially Met   CC Long Term Goal  #3   Title [name]   Status On-going   CC Long Term Goal  #4   Title Patient will be able to do grocery shopping for >/= 45 minutes while  pushing a cart.   Status On-going   CC Long Term Goal  #5   Title Patient will be able to walk >/= 20 minutes without rest without holding onto anything.   Status On-going            Plan - 09/26/15 1529    Clinical Impression Statement Reports progress, but it is gradual.  She is limited now by pain but also by ataxia, seemingly especially with weightbearing and challenging balance and gait activities.   Pt will benefit from skilled therapeutic intervention in order to improve on the following deficits Pain;Difficulty walking;Decreased strength;Decreased mobility   Rehab Potential Good   Clinical Impairments Affecting Rehab Potential CIPN   PT Frequency 2x / week   PT Duration 8 weeks   PT Treatment/Interventions Therapeutic exercise;Neuromuscular re-education;Electrical Stimulation   PT Next Visit Plan Will need renewal or discharge; suggest renewal.  Reassess goals.  Continue neuromuscular re-ed, functional mobility, strengthening, and pain relieving techniques.   Consulted and Agree with Plan of Care Patient        Problem List Patient Active Problem List   Diagnosis Date Noted  . Weakness 04/20/2015  . Thyromegaly 12/14/2014  . Contact dermatitis 12/14/2014  . Colon cancer ([place]) 11/14/2014  . Rectal bleeding 08/11/2014  . Chest pain 09-Jun-2014  . SOB (shortness of breath) 06/09/14  . Loss or death of family member 06-21-13  . Obesity 05/29/2009    SALISBURY,DONNA 09/26/2015, 3:33 PM  [place], [place], [address] Phone: [phone]   Fax:  [place], PT 09/26/2015 3:33 PM

## 2015-09-28 ENCOUNTER — Ambulatory Visit: Payer: BC Managed Care – PPO | Admitting: Physical Therapy

## 2015-09-28 DIAGNOSIS — T451X5A Adverse effect of antineoplastic and immunosuppressive drugs, initial encounter: Secondary | ICD-10-CM

## 2015-09-28 DIAGNOSIS — M79671 Pain in right foot: Secondary | ICD-10-CM | POA: Diagnosis not present

## 2015-09-28 DIAGNOSIS — G620 Drug-induced polyneuropathy: Secondary | ICD-10-CM

## 2015-09-28 DIAGNOSIS — M79672 Pain in left foot: Secondary | ICD-10-CM

## 2015-09-28 DIAGNOSIS — R269 Unspecified abnormalities of gait and mobility: Secondary | ICD-10-CM

## 2015-09-28 NOTE — Therapy (Signed)
[place], [place], [address] Phone: [phone]   Fax:  [phone]  Physical Therapy Treatment  Patient Details  Name: Jenny [name] MRN: [id number] Date of Birth: [birth year] Referring Provider:  Truitt Merle, MD  Encounter Date: 09/28/2015      PT End of Session - 09/28/15 1729    Visit Number 13   Number of Visits 21   Date for PT Re-Evaluation 09/28/15   PT Start Time 7035   PT Stop Time 1430   PT Time Calculation (min) 45 min   Activity Tolerance Patient limited by fatigue   Behavior During Therapy [facility] for tasks assessed/performed      Past Medical History  Diagnosis Date  . Enlarged thyroid   . Anemia   . Cancer     colon cancer     Past Surgical History  Procedure Laterality Date  . Tonsillectomy    . Cervical spine surgery  06/17/2012    C5-C7 ACDF  . Cesarean section    . Partial hysterectomy    . Portacath placement Left 12/22/2014    Procedure: INSERTION PORT-A-CATH LEFT SUBCLAVIAN;  Surgeon: [name], MD;  [facility];  Service: General;  Laterality: Left;  . Port-a-cath removal Left 07/28/2015    Procedure: REMOVAL PORT-A-CATH;  Surgeon: [name], MD;  [facility];  Service: General;  Laterality: Left;    There were no vitals filed for this visit.  Visit Diagnosis:  Pain in both feet  Peripheral neuropathy due to chemotherapy [facility])  Abnormality of gait      Subjective Assessment - 09/28/15 1357    Subjective Pt states she wants to keep coming for PT for 4 more weeks in the hopes that she can transition to a community exercise program and get back to work.    Pertinent History Colon cancer diagnosed end of September 2015; did surgery in November to remove 12 inches of colon with 28 lymph nodes removed (one positive), stage III.  Adjuvant chemotherapy finished 04/2015.  Had neck disc surgery in approx. 2013 (with plate fusion) with fair outcome.  Has  enlarged thyroid that is currentl being worked up.  Trying to adjust to pain from neuropathy.                                           Patient Stated Goals to be able to go back to work    Currently in Pain? Yes   Pain Score 5    Pain Location Foot  bottom of root and goes around to the top of feet    Pain Orientation Right;Left   Pain Descriptors / Indicators Burning;Tingling   Pain Type Neuropathic pain   Pain Radiating Towards top of foot    Pain Onset More than a month ago   Aggravating Factors  worse when standing up on feet.    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             [facility] PT Assessment - 09/28/15 0001    Berg Balance Test   Sit to Stand Able to stand  independently using hands   Standing Unsupported Able to stand 2 minutes with supervision  pt with lots of trunk  and body movement to stay upright    Sitting with Back Unsupported but Feet Supported on Floor or Stool Able to sit safely and securely 2 minutes   Stand to Sit Controls descent by using hands   Transfers Able to transfer safely, definite need of hands   Standing Unsupported with Eyes Closed Able to stand 10 seconds with supervision   Standing Ubsupported with Feet Together Able to place feet together independently but unable to hold for 30 seconds   From Standing, Reach Forward with Outstretched Arm Can reach confidently >25 cm (10")   From Standing Position, Pick up Object from Floor Able to pick up shoe, needs supervision   From Standing Position, Turn to Look Behind Over each Shoulder Needs assist to keep from losing balance and falling   Turn 360 Degrees Able to turn 360 degrees safely but slowly   Standing Unsupported, Alternately Place Feet on Step/Stool Able to complete >2 steps/needs minimal assist   Standing Unsupported, One Foot in [employer] balance while stepping or standing   Standing on  One Leg Unable to try or needs assist to prevent fall   Total Score 31   Berg comment: pt with trunk ataxia and fatigue during test requiring several sitting rest breaks                      [facility] Adult PT Treatment/Exercise - 09/28/15 0001    Modalities   Modalities Moist Heat;Electrical Stimulation                        Long Term Clinic Goals - 09/28/15 1737    CC Long Term Goal  #1   Title Patient will be independent in self-management of pain using therapy techniques.  pt using TENS at home   Status Achieved   CC Long Term Goal  #2   Title Patient will be knowledgeable about where and how to obtain equipment to help her with pain and mobility (such as a platform walker, TENS unit)   Status Achieved   CC Long Term Goal  #3   Title [name]   Status Not Met   CC Long Term Goal  #4   Title Patient will be able to do grocery shopping for >/= 45 minutes while pushing a cart.   Status On-going   CC Long Term Goal  #5   Title Patient will be able to walk >/= 20 minutes without rest without holding onto anything.   Status On-going   CC Long Term Goal  #6   Title pt will improve BERG score to 37/56 indicating improved balance and lower risk for fall    Time 4   Period Weeks   Status New   Additional Goals   Additional Goals Yes            Plan - 09/28/15 1729    Clinical Impression Statement Discussed progress and goals with patient.  She wants to continue PT for 4 more weeks and hopefully transition to community exercise program either at [facility] or [place].  She scored 31/56 in BERG indicating a high  risk for falls if she walks without assistive device and demonstrates large amplitude truncal  ataxia when she is fatigued or stressed. This test took extra time as she needed several rest breaks as  activity was very  challenging to her   She has decreased peripheral sensation and possible decreased vestibular input as ataxia increased with head turns so will focus next session on maximizng visual compensatory strategies for balance and strengthing program  in addition to continuation of moist heat and electric stim for peripheral neuropathy symptoms    Pt will benefit from skilled therapeutic intervention in order to improve on the following deficits Pain;Difficulty walking;Decreased strength;Decreased mobility;Abnormal gait;Decreased endurance;Decreased activity tolerance;Decreased balance   Rehab Potential Good   Clinical Impairments Affecting Rehab Potential CIPN previous chemotherapy    PT Frequency 2x / week   PT Duration 4 weeks   PT Treatment/Interventions Therapeutic exercise;Neuromuscular re-education;Electrical Stimulation;Moist Heat;Balance training;Patient/family education   PT Next Visit Plan supine scapular series and supine LE strength HEP.  instuct in visual focal point for balance activites. moist heat and electic stim to feet    Consulted and Agree with Plan of Care Patient        Problem List Patient Active Problem List   Diagnosis Date Noted  . Weakness 04/20/2015  . Thyromegaly 12/14/2014  . Contact dermatitis 12/14/2014  . Colon cancer ([place]) 11/14/2014  . Rectal bleeding 08/11/2014  . Chest pain May 24, 2014  . SOB (shortness of breath) 05/24/14  . Loss or death of family member 2013/06/05  . Obesity 05/29/2009   Donato Heinz. Owens Shark, PT  09/28/2015, 5:42 PM  [place], [place], [address] Phone: [phone]   Fax:  [phone]

## 2015-10-03 ENCOUNTER — Ambulatory Visit: Payer: BC Managed Care – PPO | Admitting: Physical Therapy

## 2015-10-04 ENCOUNTER — Ambulatory Visit: Payer: BC Managed Care – PPO

## 2015-10-04 DIAGNOSIS — M79671 Pain in right foot: Secondary | ICD-10-CM

## 2015-10-04 DIAGNOSIS — G620 Drug-induced polyneuropathy: Secondary | ICD-10-CM

## 2015-10-04 DIAGNOSIS — R269 Unspecified abnormalities of gait and mobility: Secondary | ICD-10-CM

## 2015-10-04 DIAGNOSIS — T451X5A Adverse effect of antineoplastic and immunosuppressive drugs, initial encounter: Secondary | ICD-10-CM

## 2015-10-04 DIAGNOSIS — M79672 Pain in left foot: Secondary | ICD-10-CM

## 2015-10-04 NOTE — Therapy (Signed)
[place], [place], [address] Phone: [phone]   Fax:  [phone]  Physical Therapy Treatment  Patient Details  Name: Jenny [name] MRN: [id number] Date of Birth: 1961-04-25 Referring Provider:  Truitt Merle, MD  Encounter Date: 10/04/2015      PT End of Session - 10/04/15 1549    Visit Number 14   Number of Visits 21   Date for PT Re-Evaluation 10/29/15   PT Start Time 1520   PT Stop Time 1600   PT Time Calculation (min) 40 min   Activity Tolerance Patient limited by fatigue   Behavior During Therapy [facility] for tasks assessed/performed      Past Medical History  Diagnosis Date  . Enlarged thyroid   . Anemia   . Cancer     colon cancer     Past Surgical History  Procedure Laterality Date  . Tonsillectomy    . Cervical spine surgery  06/17/2012    C5-C7 ACDF  . Cesarean section    . Partial hysterectomy    . Portacath placement Left 12/22/2014    Procedure: INSERTION PORT-A-CATH LEFT SUBCLAVIAN;  Surgeon: [name], MD;  [facility];  Service: General;  Laterality: Left;  . Port-a-cath removal Left 07/28/2015    Procedure: REMOVAL PORT-A-CATH;  Surgeon: [name], MD;  [facility];  Service: General;  Laterality: Left;    There were no vitals filed for this visit.  Visit Diagnosis:  Pain in both feet  Peripheral neuropathy due to chemotherapy [facility])  Abnormality of gait      Subjective Assessment - 10/04/15 1544    Subjective I feel so tired today. Wednesday is normally my bad day bc I do so much from Sunday-Tuesday helping with my grandkids that when Wed comes I'm completely exhausted. I am still trying to find a balance.                           [facility] Adult PT Treatment/Exercise - 10/04/15 0001    Neuro Re-ed    Neuro Re-ed Details  Standing on [employer] by counter and with CGA for using focal point on wall and then performing Lt/Rt and Up/Down head  turns keeping focal point, and then alternate marching 5 reps each leg still on A[employer] and using focal point on the wall and pt had very minimal tremors with this and she reported she felt this was beneficial. Did nothing further today though as pt wsa hardly able to keep her eyes open.   Moist Heat Therapy   Number Minutes Moist Heat 20 Minutes   Moist Heat Location Other (comment)  Bil feet during IFC   Electrical Stimulation   Electrical Stimulation Location Bilateral plantar feet   Electrical Stimulation Action IFC    Electrical Stimulation Parameters 80-150 Hz x 20 minutes Lt foot output 11 and Rt foot output 23   Electrical Stimulation Goals Pain                PT Education - 10/04/15 1555    Education provided Yes   Education Details Instructed pt to begin trying the focal point technique with her ADLs that we did today to see if this decreases her tremors and pt liked this idea as it was beneficial today during therapy.   Person(s) Educated Patient   Methods Explanation;Demonstration   Comprehension Verbalized understanding;Returned demonstration;Need further instruction  [place] Clinic Goals - 09/28/15 1737    CC Long Term Goal  #1   Title Patient will be independent in self-management of pain using therapy techniques.  pt using TENS at home   Status Achieved   CC Long Term Goal  #2   Title Patient will be knowledgeable about where and how to obtain equipment to help her with pain and mobility (such as a platform walker, TENS unit)   Status Achieved   CC Long Term Goal  #3   Title [name]   Status Not Met   CC Long Term Goal  #4   Title Patient will be able to do grocery shopping for >/= 45 minutes while pushing a cart.   Status On-going   CC Long Term Goal  #5   Title Patient will be able to walk >/= 20 minutes without rest without holding onto anything.    Status On-going   CC Long Term Goal  #6   Title pt will improve BERG score to 37/56 indicating improved balance and lower risk for fall    Time 4   Period Weeks   Status New   Additional Goals   Additional Goals Yes            Plan - 10/04/15 1550    Clinical Impression Statement Briefly did visual compensatory techniques today with using a focal point with head turns and alternate marching standing on an unsteady surface and pt had very minimal-no tremor episodes during this and that is very good considering how fatigued pt is today. She reports normally is this fatigued on Wednesdays just due to her being so busy at the beginning of the week with grandkids catches up to her. She was unable to keep her eyes open due to fatigue upon arrival at therapy and had minimal LOB when getting up from chair in waiting room due to a tremor, so only did brief trial of visual compensation with balance activity today. Stayed with pt during eletrical stimulation today to monitor her and encourage her to call husband for a ride home instead of driving herself.   Pt will benefit from skilled therapeutic intervention in order to improve on the following deficits Pain;Difficulty walking;Decreased strength;Decreased mobility;Abnormal gait;Decreased endurance;Decreased activity tolerance;Decreased balance   Rehab Potential Good   Clinical Impairments Affecting Rehab Potential CIPN previous chemotherapy    PT Frequency 2x / week   PT Duration 4 weeks   PT Treatment/Interventions Therapeutic exercise;Neuromuscular re-education;Electrical Stimulation;Moist Heat;Balance training;Patient/family education   PT Next Visit Plan supine scapular series and supine LE strength HEP.  Continue in visual focal point for balance activites. moist heat and electic stim to feet    Consulted and Agree with Plan of Care Patient        Problem List Patient Active Problem List   Diagnosis Date Noted  . Weakness 04/20/2015   . Thyromegaly 12/14/2014  . Contact dermatitis 12/14/2014  . Colon cancer ([place]) 11/14/2014  . Rectal bleeding 08/11/2014  . Chest pain 06/04/2014  . SOB (shortness of breath) Jun 04, 2014  . Loss or death of family member 06/16/2013  . Obesity 05/29/2009    Otelia Limes, PTA 10/04/2015, 4:01 PM  [place], [place], [address] Phone: [phone]   Fax:  [phone]

## 2015-10-10 ENCOUNTER — Ambulatory Visit: Payer: BC Managed Care – PPO | Admitting: Physical Therapy

## 2015-10-10 DIAGNOSIS — M79672 Pain in left foot: Secondary | ICD-10-CM

## 2015-10-10 DIAGNOSIS — M79671 Pain in right foot: Secondary | ICD-10-CM | POA: Diagnosis not present

## 2015-10-10 DIAGNOSIS — R269 Unspecified abnormalities of gait and mobility: Secondary | ICD-10-CM

## 2015-10-10 DIAGNOSIS — G620 Drug-induced polyneuropathy: Secondary | ICD-10-CM

## 2015-10-10 DIAGNOSIS — T451X5A Adverse effect of antineoplastic and immunosuppressive drugs, initial encounter: Secondary | ICD-10-CM

## 2015-10-10 NOTE — Therapy (Signed)
[facility] [address] [place], [place], [address] Phone: [phone]   Fax:  [phone]  Physical Therapy Treatment  Patient Details  Name: Jenny [name] MRN: [id number] Date of Birth: 01/05/1961 Referring Provider: Dr. Malachy Mood  Encounter Date: 10/10/2015      PT End of Session - 10/10/15 1749    Visit Number 15   Number of Visits 21   Date for PT Re-Evaluation 10/29/15   PT Start Time 1345   PT Stop Time 1450   PT Time Calculation (min) 65 min   Activity Tolerance Patient tolerated treatment well   Behavior During Therapy [facility] for tasks assessed/performed      Past Medical History  Diagnosis Date  . Enlarged thyroid   . Anemia   . Cancer     colon cancer     Past Surgical History  Procedure Laterality Date  . Tonsillectomy    . Cervical spine surgery  06/17/2012    C5-C7 ACDF  . Cesarean section    . Partial hysterectomy    . Portacath placement Left 12/22/2014    Procedure: INSERTION PORT-A-CATH LEFT SUBCLAVIAN;  Surgeon: Romie Levee, MD;  Location: [facility];  Service: General;  Laterality: Left;  . Port-a-cath removal Left 07/28/2015    Procedure: REMOVAL PORT-A-CATH;  Surgeon: Romie Levee, MD;  Location: [facility];  Service: General;  Laterality: Left;    There were no vitals filed for this visit.  Visit Diagnosis:  Pain in both feet  Peripheral neuropathy due to chemotherapy [facility])  Abnormality of gait      Subjective Assessment - 10/10/15 1349    Subjective "Getting better."  "I've been riding my stationary bike some--for about 10 minutes."  Also trying to improve on my diet--working on losing weight.  Has been using TENS more.  Still struggling with overdoing it when I'm able to do it, then having more pain after that; trying to find more balance.     Currently in Pain? Yes   Pain Score 4    Pain Location Ankle  feet and legs too   Pain Orientation Right;Left   Pain Descriptors / Indicators  Throbbing;Sharp   Aggravating Factors  not sure   Pain Relieving Factors TENS, resting off of her feet, hot pack            [facility] PT Assessment - 10/10/15 0001    Assessment   Referring Provider Dr. Malachy Mood                     [facility] Adult PT Treatment/Exercise - 10/10/15 0001    Neuro Re-ed    Neuro Re-ed Details  Standing on Airex foam pad in corner: gaze stabilization exercises with sideways head turns and with head nods; feet apart and feet close together with eyes open and eyes closed; one foot forward of the other with eyes open and closed, with right forward, then with left forward.  Standing in corner on mini-tramp:  slight bounces, stepping side to side and forward/back with right foot forward, then left foot forward.  On treadmill to use handrails for support: standing on rocker board and doing forward and back with both hand and then one hand for support (right hand, then left hand); side to side rocking with both UE support.   Moist Heat Therapy   Number Minutes Moist Heat 20 Minutes   Moist Heat Location Other (comment)  feet during e-stim   Electrical Stimulation  Electrical Stimulation Location bilateral ankles   Electrical Stimulation Action Pre-mod   Electrical Stimulation Parameters 80-150 Hz, 13 on right, 10 on left for output   Electrical Stimulation Goals Pain                        Long Term Clinic Goals - 10/10/15 1354    CC Long Term Goal  #3   Title [name]   Status On-going   CC Long Term Goal  #4   Title Patient will be able to do grocery shopping for >/= 45 minutes while pushing a cart.   Baseline Did 30 minutes last week pushing a cart.   Status Partially Met   CC Long Term Goal  #5   Title Patient will be able to walk >/= 20 minutes without rest without holding onto anything.   Baseline patient estimates she could do 10-15 minutes at  home now without grabbing onto anything   Status On-going            Plan - 10/10/15 1749    Clinical Impression Statement Patient doing better today, but pain increased with therapeutic activities (neuro re-ed) done today, then was helped somewhat by electrical stim and heat.   Pt will benefit from skilled therapeutic intervention in order to improve on the following deficits Pain;Difficulty walking;Decreased strength;Decreased mobility;Abnormal gait;Decreased endurance;Decreased activity tolerance;Decreased balance   Rehab Potential Good   Clinical Impairments Affecting Rehab Potential CIPN previous chemotherapy    PT Frequency 2x / week   PT Duration 4 weeks   PT Treatment/Interventions Neuromuscular re-education;Electrical Stimulation   PT Next Visit Plan supine scapular series and supine LE strength HEP.  Continue in visual focal point for balance activites. moist heat and electic stim to feet    Consulted and Agree with Plan of Care Patient        Problem List Patient Active Problem List   Diagnosis Date Noted  . Weakness 04/20/2015  . Thyromegaly 12/14/2014  . Contact dermatitis 12/14/2014  . Colon cancer ([place]) 11/14/2014  . Rectal bleeding 08/11/2014  . Chest pain 06/03/14  . SOB (shortness of breath) 2014-06-03  . Loss or death of family member June 15, 2013  . Obesity 05/29/2009    [name],Jenny 10/10/2015, 5:52 PM  [place], [place], [address] Phone: [phone]   Fax:  [phone]  Name: Jenny [name] MRN: [id number] Date of Birth: 30-Jul-1961   Serafina Royals, PT 10/10/2015 5:52 PM

## 2015-10-11 ENCOUNTER — Ambulatory Visit: Payer: BC Managed Care – PPO | Admitting: Physical Therapy

## 2015-10-11 DIAGNOSIS — G620 Drug-induced polyneuropathy: Secondary | ICD-10-CM

## 2015-10-11 DIAGNOSIS — M79671 Pain in right foot: Secondary | ICD-10-CM | POA: Diagnosis not present

## 2015-10-11 DIAGNOSIS — T451X5A Adverse effect of antineoplastic and immunosuppressive drugs, initial encounter: Secondary | ICD-10-CM

## 2015-10-11 DIAGNOSIS — R269 Unspecified abnormalities of gait and mobility: Secondary | ICD-10-CM

## 2015-10-11 DIAGNOSIS — M79672 Pain in left foot: Secondary | ICD-10-CM

## 2015-10-11 NOTE — Therapy (Signed)
[place], [place], [address] Phone: [phone]   Fax:  [phone]  Physical Therapy Treatment  Patient Details  Name: Jenny [name] MRN: [id number] Date of Birth: 02/19/1961 Referring Provider: Dr. Truitt Merle  Encounter Date: 10/11/2015      PT End of Session - 10/11/15 1520    Visit Number 16   Number of Visits 21   Date for PT Re-Evaluation 10/29/15   PT Start Time 1435   PT Stop Time 1540   PT Time Calculation (min) 65 min   Activity Tolerance Patient limited by pain   Behavior During Therapy [facility] for tasks assessed/performed      Past Medical History  Diagnosis Date  . Enlarged thyroid   . Anemia   . Cancer     colon cancer     Past Surgical History  Procedure Laterality Date  . Tonsillectomy    . Cervical spine surgery  06/17/2012    C5-C7 ACDF  . Cesarean section    . Partial hysterectomy    . Portacath placement Left 12/22/2014    Procedure: INSERTION PORT-A-CATH LEFT SUBCLAVIAN;  Surgeon: [name], MD;  [facility]: [facility];  Service: General;  Laterality: Left;  . Port-a-cath removal Left 07/28/2015    Procedure: REMOVAL PORT-A-CATH;  Surgeon: [name], MD;  Location: [facility];  Service: General;  Laterality: Left;    There were no vitals filed for this visit.  Visit Diagnosis:  Pain in both feet  Peripheral neuropathy due to chemotherapy [facility])  Abnormality of gait      Subjective Assessment - 10/11/15 1433    Subjective "I'm alright."   Currently in Pain? Yes   Pain Score 5    Pain Location Other (Comment)  feet, leg   Pain Orientation Right;Left   Aggravating Factors  therapy session yesterday   Pain Relieving Factors TENS helped a little bit                         [facility] Adult PT Treatment/Exercise - 10/11/15 0001    Lumbar Exercises: Supine   Ab Set 5 reps   AB Set Limitations needs more instruction   Glut Set 10 reps   Other Supine  Lumbar Exercises diaphragmatic breathing x 5   Knee/Hip Exercises: Seated   Long Arc Quad Strengthening;Right;Left;10 reps   Long Arc Quad Weight 1 lbs.  lb., then 10 reps without weight (less painful)   Marching Limitations marchin 20 x right and left   Knee/Hip Exercises: Sidelying   Hip ABduction AROM;10 reps;Right;Left   Shoulder Exercises: Therapy Ball   Other Therapy Ball Exercises     Moist Heat Therapy   Number Minutes Moist Heat 20 Minutes   Moist Heat Location Other (comment)  feet during e-stim   Electrical Stimulation   Electrical Stimulation Location bilateral ankles   Electrical Stimulation Action Pre-mod   Electrical Stimulation Parameters 80-150 Hz.  approx. 10 on right, 7 on left for intensity   Electrical Stimulation Goals Pain   Ankle Exercises: Seated   ABC's 1 rep  A-G only, left and right                        [place] [facility] Goals - 10/10/15 1354    CC Long Term Goal  #3   Title Patient will be able to return to work at beginning of October with reports of pain </=  3/10.   Status On-going   CC Long Term Goal  #4   Title Patient will be able to do grocery shopping for >/= 45 minutes while pushing a cart.   Baseline Did 30 minutes last week pushing a cart.   Status Partially Met   CC Long Term Goal  #5   Title Patient will be able to walk >/= 20 minutes without rest without holding onto anything.   Baseline patient estimates she could do 10-15 minutes at home now without grabbing onto anything   Status On-going            Plan - 10/11/15 1521    Clinical Impression Statement Patient hurting today, probably from  having had treatment here yesterday that included many weightbearing activities.  Attempted to minimize discomfort by doing non-weightbearing exercise, but patient found that writing part of the alphabet with her feet (ankle motions) was fairly uncomfortable.                                                            Pt  will benefit from skilled therapeutic intervention in order to improve on the following deficits Pain;Difficulty walking;Decreased strength;Decreased mobility;Abnormal gait;Decreased endurance;Decreased activity tolerance;Decreased balance   Rehab Potential Good   Clinical Impairments Affecting Rehab Potential CIPN previous chemotherapy    PT Frequency 2x / week   PT Duration 4 weeks   PT Treatment/Interventions Therapeutic exercise;Electrical Stimulation   PT Next Visit Plan supine scapular series and supine LE strength HEP.  Continue in visual focal point for balance activites. moist heat and electic stim to feet    Consulted and Agree with Plan of Care Patient        Problem List Patient Active Problem List   Diagnosis Date Noted  . Weakness 04/20/2015  . Thyromegaly 12/14/2014  . Contact dermatitis 12/14/2014  . Colon cancer ([place]) 11/14/2014  . Rectal bleeding 08/11/2014  . Chest pain May 15, 2014  . SOB (shortness of breath) 2014/05/15  . Loss or death of family member May 27, 2013  . Obesity 05/29/2009    Jenny [name] 10/11/2015, 3:25 PM  [place], [place], [address] Phone: [phone]   Fax:  [phone]  Name: Jenny [name] MRN: [id number] Date of Birth: 06-Feb-1961    Jenny [name], PT 10/11/2015 3:25 PM

## 2015-10-13 ENCOUNTER — Other Ambulatory Visit (HOSPITAL_BASED_OUTPATIENT_CLINIC_OR_DEPARTMENT_OTHER): Payer: BC Managed Care – PPO

## 2015-10-13 ENCOUNTER — Encounter: Payer: Self-pay | Admitting: Hematology

## 2015-10-13 ENCOUNTER — Telehealth: Payer: Self-pay | Admitting: Hematology

## 2015-10-13 ENCOUNTER — Ambulatory Visit (HOSPITAL_BASED_OUTPATIENT_CLINIC_OR_DEPARTMENT_OTHER): Payer: BC Managed Care – PPO | Admitting: Hematology

## 2015-10-13 VITALS — BP 120/87 | HR 67 | Temp 98.6°F | Resp 19 | Ht 64.0 in | Wt 269.5 lb

## 2015-10-13 DIAGNOSIS — Z23 Encounter for immunization: Secondary | ICD-10-CM | POA: Diagnosis not present

## 2015-10-13 DIAGNOSIS — G620 Drug-induced polyneuropathy: Secondary | ICD-10-CM | POA: Diagnosis not present

## 2015-10-13 DIAGNOSIS — C187 Malignant neoplasm of sigmoid colon: Secondary | ICD-10-CM

## 2015-10-13 DIAGNOSIS — C189 Malignant neoplasm of colon, unspecified: Secondary | ICD-10-CM

## 2015-10-13 LAB — COMPREHENSIVE METABOLIC PANEL (CC13)
ALT: 23 U/L (ref 0–55)
AST: 20 U/L (ref 5–34)
Albumin: 4.2 g/dL (ref 3.5–5.0)
Alkaline Phosphatase: 64 U/L (ref 40–150)
Anion Gap: 9 mEq/L (ref 3–11)
BUN: 14.5 mg/dL (ref 7.0–26.0)
CO2: 23 mEq/L (ref 22–29)
Calcium: 10.5 mg/dL — ABNORMAL HIGH (ref 8.4–10.4)
Chloride: 108 mEq/L (ref 98–109)
Creatinine: 0.8 mg/dL (ref 0.6–1.1)
EGFR: >90 mL/min/{1.73_m2} (ref >90)
Glucose: 137 mg/dl (ref 70–140)
Potassium: 4.3 mEq/L (ref 3.5–5.1)
Sodium: 140 mEq/L (ref 136–145)
Total Bilirubin: 0.36 mg/dL (ref 0.20–1.20)
Total Protein: 7.8 g/dL (ref 6.4–8.3)

## 2015-10-13 LAB — CBC & DIFF AND RETIC
BASO%: 0.2 % (ref 0.0–2.0)
Basophils Absolute: 0 10*3/uL (ref 0.0–0.1)
EOS%: 2.1 % (ref 0.0–7.0)
Eosinophils Absolute: 0.1 10*3/uL (ref 0.0–0.5)
HCT: 39.7 % (ref 34.8–46.6)
HGB: 13.2 g/dL (ref 11.6–15.9)
Immature Retic Fract: 11.8 % — ABNORMAL HIGH (ref 1.60–10.00)
LYMPH%: 45.6 % (ref 14.0–49.7)
MCH: 28 pg (ref 25.1–34.0)
MCHC: 33.2 g/dL (ref 31.5–36.0)
MCV: 84.1 fL (ref 79.5–101.0)
MONO#: 0.3 10*3/uL (ref 0.1–0.9)
MONO%: 5.4 % (ref 0.0–14.0)
NEUT#: 2.9 10*3/uL (ref 1.5–6.5)
NEUT%: 46.7 % (ref 38.4–76.8)
Platelets: 270 10*3/uL (ref 145–400)
RBC: 4.72 10*6/uL (ref 3.70–5.45)
RDW: 14.4 % (ref 11.2–14.5)
Retic %: 1.53 % (ref 0.70–2.10)
Retic Ct Abs: 72.22 10*3/uL (ref 33.70–90.70)
WBC: 6.2 10*3/uL (ref 3.9–10.3)
lymph#: 2.8 10*3/uL (ref 0.9–3.3)

## 2015-10-13 MED ORDER — INFLUENZA VAC SPLIT QUAD 0.5 ML IM SUSY
0.5000 mL | PREFILLED_SYRINGE | Freq: Once | INTRAMUSCULAR | Status: AC
  Administered 2015-10-13: 0.5 mL via INTRAMUSCULAR
  Filled 2015-10-13: qty 0.5

## 2015-10-13 NOTE — Telephone Encounter (Signed)
per pof to sch pt appt-gave pt copy of scan-[facility] will call to sch scan

## 2015-10-13 NOTE — Progress Notes (Signed)
[place]  [phone] Fax:([phone]  Clinic Follow Up Note   Patient Care Team: Lind Covert, MD as PCP - General Juanita Craver, MD as Consulting Physician (Gastroenterology) [name], MD as Consulting Physician (General Surgery) Truitt Merle, MD as Consulting Physician (Hematology) 10/13/2015  CHIEF COMPLAINTS Follow up colon cancer   DIAGNOSIS:  Sigmoid colon cancer   Staging form: Colon and Rectum, AJCC 7th Edition     Clinical: Stage IIIB (T3, N1a, M0) - Signed by Truitt Merle, MD on 12/28/2014     Colon cancer ([place])   10/10/2014 Imaging CT abdomen and pelvis: Eccentric wall thickening along the sigmoid colon, worrisome for early colonic adenocarcinoma. No findings suspicious for metastatic disease. CT chest (-)      11/14/2014 Initial Diagnosis Colon cancer   11/14/2014 Pathologic Stage pT3pN1pMx, G2, tumor invading through the muscular propria into pericolonic fatty tissue.  LVI (-), PNI (-). 1/28 node positive, margins negative.    11/14/2014 Surgery sigmoid colon segmental resection, margins (-).    12/28/2014 - 05/17/2015 Adjuvant Chemotherapy [id number], 10% dose reduction due to neuropathy and cytopenia from cycle 5, oxaliplatin held from cycle 8 due to leg pain and neuropathy. chemo stopped after cycle 10 due to her tolerance issue.     HISTORY OF INITIAL PRESENTING ILLNESS:  Jenny [name] 54 y.o. female without significant past medical history, who is referred by her surgeon Dr. Marcello Moores to discuss adjuvant chemotherapy for her resected colon cancer.  She presented with abdominal pain for 8-9 month, which was related to position. She noticed bloody stool in Aug 2015. She was seen by PCP, and was referred to GI Dr. Collene Mares, and underwent colonoscopy which showed a mass at sigmoid colon (per patient, I do not have the report). Biopsy showed adenocarcinoma. She was then referred to surgeon Dr. Marcello Moores and underwent sigmoid colon segmentectomy on  11/14/2014.   TREATMENT: observation  INTERIM HISTORY: Jenny [name] returns for follow-up. She started physical therapy about 2 months ago, and her leg pain and stiffness has significantly improved, but has not completely resolved. She will probably finish rehabilitation early next months, and we'll continue exercise program afterwards. She feels she has benefited quite bit from the rehabilitation. Her energy level has improved, although she still feel quite fatigued after exertion. She is able to tolerate routine light activity, but doesn't feel strong enough to go back to work yet. Her short-term disability will cover her until January 2017. She does plan to return to work at that time. Her appetite is great, she denies any dyspnea, nausea, or other new symptoms. Her bowel movements normal, no melena or hematochezia.  MEDICAL HISTORY:  Past Medical History  Diagnosis Date  . Enlarged thyroid   . Anemia   . Cancer [facility])     colon cancer     SURGICAL HISTORY: Past Surgical History  Procedure Laterality Date  . Tonsillectomy    . Cervical spine surgery  06/17/2012    C5-C7 ACDF  . Cesarean section    . Partial hysterectomy    . Portacath placement Left 12/22/2014    Procedure: INSERTION PORT-A-CATH LEFT SUBCLAVIAN;  Surgeon: [name], MD;  [facility];  Service: General;  Laterality: Left;  . Port-a-cath removal Left 07/28/2015    Procedure: REMOVAL PORT-A-CATH;  Surgeon: [name], MD;  Location: [facility];  Service: General;  Laterality: Left;    SOCIAL HISTORY: History   Social History  . Marital Status: Married  Spouse Name: N/A    Number of Children: 2  . Years of Education: N/A   Occupational History  . Not on file.   Social History Main Topics  . Smoking status: Never Smoker   . Smokeless tobacco: Never Used  . Alcohol Use: No  . Drug Use: No  . Sexual Activity: Not on file    P2G2, one child was murdered. She is a Paramedic.   FAMILY  HISTORY: Family History  Problem Relation Age of Onset  . Colon cancer Neg Hx   . Liver Cancer Brother        . Hypertension Mother     ALLERGIES:  is allergic to other and shellfish allergy.  MEDICATIONS:  Current Outpatient Prescriptions  Medication Sig Dispense Refill  . acetaminophen (TYLENOL) 500 MG tablet Take 500 mg by mouth every 6 (six) hours as needed.    . diphenhydrAMINE (BENADRYL) 12.5 MG/5ML liquid Take 12.5-25 mg by mouth every 4 (four) hours as needed for itching or allergies.    Marland Kitchen gabapentin (NEURONTIN) 300 MG capsule Take 1 capsule (300 mg total) by mouth 3 (three) times daily. 90 capsule 2  . Hydrocodone-Acetaminophen (VICODIN) 5-300 MG TABS Take 1 tablet by mouth every 21 ( twenty-one) days. (Patient taking differently: Take 1 tablet by mouth as needed. ) 5 each 0  . hydrocortisone cream 0.5 % Apply 1 application topically 2 (two) times daily as needed for itching.    . lidocaine-prilocaine (EMLA) cream Apply 1 application topically as needed. 30 g 2  . loratadine (CLARITIN) 10 MG tablet Take 10 mg by mouth daily. AS NEEDED    . Multiple Vitamin (MULTIVITAMIN) tablet Take 1 tablet by mouth daily.    . naproxen sodium (ANAPROX) 220 MG tablet Take 220 mg by mouth 2 (two) times daily with a meal.    . ondansetron (ZOFRAN) 8 MG tablet Take 1 by mouth twice a day for 2 days after chemotherapy and then twice daily as needed 30 tablet 1  . oxyCODONE (OXY IR/ROXICODONE) 5 MG immediate release tablet Take 1 tablet (5 mg total) by mouth every 6 (six) hours as needed for severe pain. 60 tablet 0  . triamcinolone (KENALOG) 0.025 % ointment Apply 1 application topically 2 (two) times daily. 30 g 1   No current facility-administered medications for this visit.    REVIEW OF SYSTEMS:   Constitutional: Denies fevers, chills or abnormal night sweats Eyes: Denies blurriness of vision, double vision or watery eyes Ears, nose, mouth, throat, and face: Denies mucositis or sore  throat Respiratory: Denies cough, dyspnea or wheezes Cardiovascular: Denies palpitation, chest discomfort or lower extremity swelling Gastrointestinal:  Denies nausea, heartburn or change in bowel habits Skin: Denies abnormal skin rashes Lymphatics: Denies new lymphadenopathy or easy bruising Neurological:Denies numbness, tingling or new weaknesses Behavioral/Psych: Mood is stable, no new changes  All other systems were reviewed with the patient and are negative.  PHYSICAL EXAMINATION: ECOG PERFORMANCE STATUS: 1 Vital sign was reviewed, normal.  GENERAL:alert, no distress and comfortable, obese  SKIN: skin color, texture, turgor are normal, no rashes or significant lesions EYES: normal, conjunctiva are pink and non-injected, sclera clear OROPHARYNX:no exudate, no erythema and lips, buccal mucosa, and tongue normal  NECK: supple, thyroid normal size, non-tender, without nodularity LYMPH:  no palpable lymphadenopathy in the cervical, axillary or inguinal LUNGS: clear to auscultation and percussion with normal breathing effort HEART: regular rate & rhythm and no murmurs and no lower extremity edema ABDOMEN:abdomen soft, non-tender  and normal bowel sounds. Surgical wound has well-healed Musculoskeletal:no cyanosis of digits and no clubbing  PSYCH: alert & oriented x 3 with fluent speech NEURO: no focal motor/sensory deficits  LABORATORY DATA:  I have reviewed the data as listed CBC Latest Ref Rng 10/13/2015 07/17/2015 05/17/2015  WBC 3.9 - 10.3 10e3/uL 6.2 5.7 4.7  Hemoglobin 11.6 - 15.9 g/dL 13.2 12.2 13.2  Hematocrit 34.8 - 46.6 % 39.7 36.2 40.0  Platelets 145 - 400 10e3/uL 270 207 197    CMP Latest Ref Rng 10/13/2015 07/17/2015 05/17/2015  Glucose 70 - 140 mg/dl 137 154(H) 144(H)  BUN 7.0 - 26.0 mg/dL 14.5 11.6 11.4  Creatinine 0.6 - 1.1 mg/dL 0.8 0.7 0.9  Sodium 136 - 145 mEq/L 140 138 140  Potassium 3.5 - 5.1 mEq/L 4.3 4.0 4.3  Chloride 96 - 112 mEq/L - - -  CO2 22 - 29 mEq/L  23 22 21(L)  Calcium 8.4 - 10.4 mg/dL 10.5(H) 9.2 9.6  Total Protein 6.4 - 8.3 g/dL 7.8 6.9 7.8  Total Bilirubin 0.20 - 1.20 mg/dL 0.36 0.63 0.47  Alkaline Phos 40 - 150 U/L 64 56 73  AST 5 - 34 U/L $Remo'20 18 21  'glpfo$ ALT 0 - 55 U/L $Remo'23 18 16   'LDstg$ CEA  Status: Finalresult Visible to patient:  Not Released Nextappt: 10/16/2015 at 01:45 PM in Rehabilitation [facility], PT) Dx:  Colon cancer ([place])           Ref Range 37mo ago  56mo ago     CEA 0.0 - 5.0 ng/mL <0.5 <0.5          PATHOLOGY REPORT 11/14/2014 Diagnosis(continued) 1. Colon, segmental resection for tumor, sigmoid - INVASIVE ADENOCARCINOMA, INVADING THROUGH THE MUSCULARIS PROPRIA INTO PERICOLONIC FATTY TISSUE. - ONE OF TWENTY-EIGHT LYMPH NODES, POSITIVE FOR METASTATIC CARCINOMA (1/28). - RESECTION MARGINS, NEGATIVE FOR ATYPIA OR MALIGNANCY - RESECTION MARGINS, NEGATIVE FOR ATYPIA OR MALIGNANCY. 2. Colon, resection margin (donut), colon final distal margin - BENIGN COLONIC MUCOSA, NO EVIDENCE OF MALIGNANCY. Microscopic Comment 1. COLON Specimen: Sigmoid colon Procedure: Segmental resection Tumor site: Sigmoid colon Specimen integrity: Intact Macroscopic intactness of mesorectum: N/A Macroscopic tumor perforation: No Invasive tumor: Maximum size: 2.7 cm, gross measurement Histologic type(s): Invasive adenocarcinoma Histologic grade and differentiation: G2: moderately differentiated/low grade Type of polyp tumor arose from: Tubular adenoma Microscopic extension of invasive tumor: Invading through the muscularis propria into pericolonic fatty tissue. Lymph-Vascular invasion: Not identified Peri-neural invasion: Not identified Tumor deposit(s) (discontinuous extramural extension): N/A Resection margins: Negative Open margin: 7.1 cm Stapled margin: 5.5 cm Mesenteric margin (sigmoid and transverse): 9 cm Treatment effect (neo-adjuvant therapy): No Additional polyp(s): N/A Non-neoplastic findings:  N/A Lymph nodes: number examined 28; number positive: 1 Pathologic Staging: pT3, pN1a, pMX Ancillary studies: MMR stains will be performed and an addendum report will follow. MSI testing will be performed at an outside institution and the results will be available in EPIC. (HCL:kh 11-15-14)  TUMOR MSI: stable (by PCR),  MMR: NORMAL   RADIOGRAPHIC STUDIES: I have personally reviewed the radiological images as listed and agreed with the findings in the report.  CT chest, abdomen and pelvis 07/17/2015 IMPRESSION: Suture line in the sigmoid colon, compatible with interval surgery.  No evidence for metastatic disease in the chest, abdomen, or pelvis.  ASSESSMENT & PLAN:  54 year old African-American female, without significant past medical history, presents with abdominal pain and bloody bowel movement, anemia, was found to have a sigmoid adenocarcinoma, status post complete surgical resection.   1. Sigmoid  colon adenocarcinoma, G2, pT3pN1aM0, stage IIIB, MSI stable  -the nature history of colon cancer, and high risk of cancer recurrence. IIIB disease with discussed and reviewed with her -She is now completed her adjuvant chemotherapy, she had chemo dose reduced and last 2 cycles with canceled due to her tolerance issue. -I discussed her repeat his CT scan from 07/13/2015, which showed NED -we'll continue surveillance. I'll see her every 3-4 months for the first 2 years, then every 6 months afterwards for total 5 years. We will check her lab and CEA was each visit, CT scan every 6-12 months, and colonoscopy in one year after her surgery. -She is clinically doing well, lab reviewed, within normal limits. Today's CEA is still pending. -I encouraged her to call Dr. Collene Mares for colonoscopy next month -We'll repeat a CT chest, abdomen and pelvis in 3 months  2. B/l leg and body pain, secondary to chemo -Has improved. She takes Neurontin, Tylenol or ibuprofen as needed, she is off oxycodone now.   -she will continue physical therapy and exercise program afterwards.   3. Microcytic anemia, likely iron deficient anemia secondary to colon cancer, and chemotherapy related anemia -Her serum iron and saturation were low. Likely from her colon cancer relates.  -Her anemia improved after IV Feraheme.  -anemia resolved now   4 Peripheral neuropathy -Improved -Continue Neurontin   Plan  -Return to clinic and see me in 3 months with lab and a restaging CT chest, abdomen and pelvis with contrast  -For shot today.   All questions were answered. The patient knows to call the clinic with any problems,  questions or concerns.  I spent 20 minutes counseling the patient face to face. The total time spent in the appointment was 25 minutes and more than 50% was on counseling.     Truitt Merle, MD 10/13/2015 2:48 PM

## 2015-10-14 LAB — CEA: CEA: <0.5 ng/mL (ref 0.0–5.0)

## 2015-10-16 ENCOUNTER — Ambulatory Visit: Payer: BC Managed Care – PPO | Admitting: Physical Therapy

## 2015-10-16 ENCOUNTER — Telehealth: Payer: Self-pay | Admitting: *Deleted

## 2015-10-16 DIAGNOSIS — M79671 Pain in right foot: Secondary | ICD-10-CM

## 2015-10-16 DIAGNOSIS — G620 Drug-induced polyneuropathy: Secondary | ICD-10-CM

## 2015-10-16 DIAGNOSIS — T451X5A Adverse effect of antineoplastic and immunosuppressive drugs, initial encounter: Secondary | ICD-10-CM

## 2015-10-16 DIAGNOSIS — M79672 Pain in left foot: Secondary | ICD-10-CM

## 2015-10-16 NOTE — Therapy (Signed)
[place], [place], [address] Phone: [phone]   Fax:  [phone]  Physical Therapy Treatment  Patient Details  Name: Jenny [name] MRN: [id number] Date of Birth: 1961-05-29 Referring Provider: Dr. Truitt Merle  Encounter Date: 10/16/2015      PT End of Session - 10/16/15 1726    Visit Number 17   Number of Visits 21   Date for PT Re-Evaluation 10/29/15   PT Start Time 1355   PT Stop Time 1455   PT Time Calculation (min) 60 min   Activity Tolerance Patient tolerated treatment well   Behavior During Therapy [facility] for tasks assessed/performed      Past Medical History  Diagnosis Date  . Enlarged thyroid   . Anemia   . Cancer [facility])     colon cancer     Past Surgical History  Procedure Laterality Date  . Tonsillectomy    . Cervical spine surgery  06/17/2012    C5-C7 ACDF  . Cesarean section    . Partial hysterectomy    . Portacath placement Left 12/22/2014    Procedure: INSERTION PORT-A-CATH LEFT SUBCLAVIAN;  Surgeon: [name], MD;  [facility]: [facility];  Service: General;  Laterality: Left;  . Port-a-cath removal Left 07/28/2015    Procedure: REMOVAL PORT-A-CATH;  Surgeon: [name], MD;  [facility]: [facility];  Service: General;  Laterality: Left;    There were no vitals filed for this visit.  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  Nothing else new.  Went to my doctor.  She said I need to wait till January to go back to work.   Currently in Pain? (p) Yes   Pain Score (p) 5    Pain Location (p) Other (Comment)  and ankles and legs   Pain Orientation (p) Right;Left   Aggravating Factors  (p) cold weather; being up on her feet   Pain Relieving Factors (p) TENS                         [facility] Adult PT  Treatment/Exercise - 10/16/15 0001    Moist Heat Therapy   Number Minutes Moist Heat 20 Minutes   Moist Heat Location Other (comment)  feet during e-stim   Electrical Stimulation   Electrical Stimulation Location bilateral feet   Electrical Stimulation Action Pre-mod   Electrical Stimulation Parameters 80-150 Hz   Electrical Stimulation Goals Pain   Manual Therapy   Other Manual Therapy Soft tissue work to each foot with biotone cream, legs elevated on bolster, for pain relief                        [facility] Goals - 10/10/15 1354    CC Long Term Goal  #3   Title [name]   Status On-going   CC Long Term Goal  #4   Title Patient will be able to do grocery shopping for >/= 45 minutes while pushing a cart.   Baseline Did 30 minutes last week pushing a cart.   Status Partially Met   CC Long Term Goal  #5   Title Patient will  be able to walk >/= 20 minutes without rest without holding onto anything.   Baseline patient estimates she could do 10-15 minutes at home now without grabbing onto anything   Status On-going            Plan - 10/16/15 1726    Clinical Impression Statement Early on in her course of treatment here, patient had noted benefit of soft tissue work to both feet, so it was decided to try this again today. Patient was more tolerant of touch to her feet, reacting to the cold feeling of the Biotone lotion, but not to the pressure of the soft tissue work.  Will monitor her response after session today.   Pt will benefit from skilled therapeutic intervention in order to improve on the following deficits Pain;Difficulty walking;Decreased strength;Decreased mobility;Abnormal gait;Decreased endurance;Decreased activity tolerance;Decreased balance   Rehab Potential Good   Clinical Impairments Affecting Rehab Potential CIPN previous chemotherapy    PT Frequency 2x / week    PT Duration 4 weeks   PT Treatment/Interventions Manual techniques;Electrical Stimulation   PT Next Visit Plan Mini tramp exercises; Continue with visual focal point for balance activites. moist heat and electic stim to feet; soft tissue work to feet if beneficial.   Consulted and Agree with Plan of Care Patient        Problem List Patient Active Problem List   Diagnosis Date Noted  . Weakness 04/20/2015  . Thyromegaly 12/14/2014  . Contact dermatitis 12/14/2014  . Colon cancer ([place]) 11/14/2014  . Rectal bleeding 08/11/2014  . Chest pain 06/01/14  . SOB (shortness of breath) 06/01/2014  . Loss or death of family member Jun 13, 2013  . Obesity 05/29/2009    SALISBURY,DONNA 10/16/2015, 5:29 PM  [place], [place], [address] Phone: [phone]   Fax:  [phone]  Name: FAHIMA CIFELLI MRN: [id number] Date of Birth: Sep 17, 1961    Serafina Royals, PT 10/16/2015 5:29 PM

## 2015-10-16 NOTE — Telephone Encounter (Signed)
Left message for pt to return call.  Per Dr Burr Medico, need to tell her that CEA normal & calcium slightly high & if she is taking calcium with Vit D, she should hold for now.

## 2015-10-16 NOTE — Telephone Encounter (Signed)
Pt called back & was informed of CEA & Ca+ results.  She is not taking any extra Calcium with Vit D & knows to back off of calcium in her diet for now.

## 2015-10-19 ENCOUNTER — Ambulatory Visit: Payer: BC Managed Care – PPO | Admitting: Physical Therapy

## 2015-10-19 DIAGNOSIS — G620 Drug-induced polyneuropathy: Secondary | ICD-10-CM

## 2015-10-19 DIAGNOSIS — M79672 Pain in left foot: Secondary | ICD-10-CM

## 2015-10-19 DIAGNOSIS — M79671 Pain in right foot: Secondary | ICD-10-CM | POA: Diagnosis not present

## 2015-10-19 DIAGNOSIS — R269 Unspecified abnormalities of gait and mobility: Secondary | ICD-10-CM

## 2015-10-19 DIAGNOSIS — T451X5A Adverse effect of antineoplastic and immunosuppressive drugs, initial encounter: Secondary | ICD-10-CM

## 2015-10-19 NOTE — Therapy (Signed)
[place], [place], [address] Phone: [phone]   Fax:  [phone]  Physical Therapy Treatment  Patient Details  Name: Jenny [name] MRN: [id number] Date of Birth: 29-Apr-1961 Referring Provider: Dr. Truitt Merle  Encounter Date: 10/19/2015      PT End of Session - 10/19/15 1418    Visit Number 18   Number of Visits 21   Date for PT Re-Evaluation 10/29/15   PT Start Time 1345   Activity Tolerance Patient limited by pain  in feet after exrcise    Behavior During Therapy [facility] for tasks assessed/performed      Past Medical History  Diagnosis Date  . Enlarged thyroid   . Anemia   . Cancer [facility])     colon cancer     Past Surgical History  Procedure Laterality Date  . Tonsillectomy    . Cervical spine surgery  06/17/2012    C5-C7 ACDF  . Cesarean section    . Partial hysterectomy    . Portacath placement Left 12/22/2014    Procedure: INSERTION PORT-A-CATH LEFT SUBCLAVIAN;  Surgeon: [name], MD;  [facility];  Service: General;  Laterality: Left;  . Port-a-cath removal Left 07/28/2015    Procedure: REMOVAL PORT-A-CATH;  Surgeon: [name], MD;  [facility];  Service: General;  Laterality: Left;    There were no vitals filed for this visit.  Visit Diagnosis:  Pain in both feet  Peripheral neuropathy due to chemotherapy [facility])  Abnormality of gait      Subjective Assessment - 10/19/15 1349    Subjective "I've been exercising and drinking my water"  She said she has been riding a stationary bike at home for 15 minutes 3 days a week.    Pertinent History Colon cancer diagnosed end of September 2015; did surgery in November to remove 12 inches of colon with 28 lymph nodes removed (one positive), stage III.  Adjuvant chemotherapy finished 04/2015.  Had neck disc surgery in approx. 2013 (with plate fusion) with fair outcome.  Has enlarged thyroid that is currentl being worked up.   Trying to adjust to pain from neuropathy.                                           Patient Stated Goals to be able to go back to work    Currently in Pain? Yes   Pain Score 5    Pain Location Foot   Pain Orientation Right;Left   Pain Descriptors / Indicators Burning;Tingling   Pain Type Neuropathic pain   Pain Radiating Towards to top of feet up to ankles and legs    Pain Onset More than a month ago   Pain Frequency Constant   Aggravating Factors  activity    Pain Relieving Factors rest for a long period, heat    Effect of Pain on Daily Activities unable to work                          [facility] Adult PT Treatment/Exercise - 10/19/15 0001    Neck Exercises: Seated   Other Seated Exercise neck range of motion   Other Seated Exercise eyes focused ahead for head rotation and lateral flexion.,    Knee/Hip Exercises: Standing   Forward Step Up Step Height: 6"  5 reps with each  foot   Other Standing Knee Exercises lateral weight shift to wall.    Other Standing Knee Exercises lean back against wall    Knee/Hip Exercises: Seated   Marching Limitations marchin 10 x right and left   Shoulder Exercises: Seated   Other Seated Exercises both hands together hip to hip from shoulder height.                         [facility] Goals - 10/10/15 1354    CC Long Term Goal  #3   Title [name]   Status On-going   CC Long Term Goal  #4   Title Patient will be able to do grocery shopping for >/= 45 minutes while pushing a cart.   Baseline Did 30 minutes last week pushing a cart.   Status Partially Met   CC Long Term Goal  #5   Title Patient will be able to walk >/= 20 minutes without rest without holding onto anything.   Baseline patient estimates she could do 10-15 minutes at home now without grabbing onto anything   Status On-going            Plan - 10/19/15 1418     Clinical Impression Statement Pt doing better today when walking in to dept, but had increase in symptoms with prgression of standing balance and strengthening work She needed to have some heat to feet to reduce the pain in feet and could not tolerate soft tissue work after that.  She needs to continue leg strengthening in non weight bearing posiition.    Pt will benefit from skilled therapeutic intervention in order to improve on the following deficits Pain;Difficulty walking;Decreased strength;Decreased mobility;Abnormal gait;Decreased endurance;Decreased activity tolerance;Decreased balance   Clinical Impairments Affecting Rehab Potential CIPN previous chemotherapy    PT Next Visit Plan Mini tramp exercises if not pain in feet. ; Continue with visual focal point for balance activites. Do hip strengthening in supine and sidelying moist heat and electic stim to feet; soft tissue work to feet if beneficial.         Problem List Patient Active Problem List   Diagnosis Date Noted  . Weakness 04/20/2015  . Thyromegaly 12/14/2014  . Contact dermatitis 12/14/2014  . Colon cancer ([place]) 11/14/2014  . Rectal bleeding 08/11/2014  . Chest pain Jun 11, 2014  . SOB (shortness of breath) 06-11-2014  . Loss or death of family member 06/23/2013  . Obesity 05/29/2009   Donato Heinz. Owens Shark, PT  10/19/2015, 2:40 PM  [place], [place], [address] Phone: [phone]   Fax:  [phone]  Name: Jenny [name] MRN: [id number] Date of Birth: Mar 01, 1961

## 2015-10-24 ENCOUNTER — Ambulatory Visit: Payer: BC Managed Care – PPO | Attending: Hematology | Admitting: Physical Therapy

## 2015-10-24 DIAGNOSIS — T451X5A Adverse effect of antineoplastic and immunosuppressive drugs, initial encounter: Secondary | ICD-10-CM | POA: Insufficient documentation

## 2015-10-24 DIAGNOSIS — R269 Unspecified abnormalities of gait and mobility: Secondary | ICD-10-CM | POA: Diagnosis not present

## 2015-10-24 DIAGNOSIS — M79671 Pain in right foot: Secondary | ICD-10-CM | POA: Diagnosis not present

## 2015-10-24 DIAGNOSIS — M79672 Pain in left foot: Secondary | ICD-10-CM | POA: Insufficient documentation

## 2015-10-24 DIAGNOSIS — G620 Drug-induced polyneuropathy: Secondary | ICD-10-CM

## 2015-10-24 NOTE — Therapy (Signed)
[place], [place], [address] Phone: [phone]   Fax:  [phone]  Physical Therapy Treatment  Patient Details  Name: Jenny [name] MRN: [id number] Date of Birth: 1961/03/14 Referring Provider: Dr. Truitt Merle  Encounter Date: 10/24/2015      PT End of Session - 10/24/15 1338    Visit Number 19   Number of Visits 21   Date for PT Re-Evaluation 10/29/15   PT Start Time 1320   PT Stop Time 1355   PT Time Calculation (min) 35 min   Activity Tolerance Patient limited by pain   Behavior During Therapy [facility] for tasks assessed/performed      Past Medical History  Diagnosis Date  . Enlarged thyroid   . Anemia   . Cancer [facility])     colon cancer     Past Surgical History  Procedure Laterality Date  . Tonsillectomy    . Cervical spine surgery  06/17/2012    C5-C7 ACDF  . Cesarean section    . Partial hysterectomy    . Portacath placement Left 12/22/2014    Procedure: INSERTION PORT-A-CATH LEFT SUBCLAVIAN;  Surgeon: [name], MD;  [facility];  Service: General;  Laterality: Left;  . Port-a-cath removal Left 07/28/2015    Procedure: REMOVAL PORT-A-CATH;  Surgeon: [name], MD;  [facility];  Service: General;  Laterality: Left;    There were no vitals filed for this visit.  Visit Diagnosis:  Pain in both feet  Peripheral neuropathy due to chemotherapy [facility])  Abnormality of gait      Subjective Assessment - 10/24/15 1316    Subjective pt comes in today with granddaughter 10 minutes late.  She was observed to have difficulty hurrying across the parking lot to come to appointment She did not bring a cane today    Patient Stated Goals to be able to go back to work    Currently in Pain? Yes   Pain Score 8    Pain Location Foot   Pain Orientation Right;Left   Pain Descriptors / Indicators Burning;Tingling   Pain Type Neuropathic pain   Pain Radiating Towards top of feet up   legs    Pain Onset More than a month ago   Pain Frequency Constant   Aggravating Factors  activity    Pain Relieving Factors hot packs    Effect of Pain on Daily Activities unable to work                          [facility] Adult PT Treatment/Exercise - 10/24/15 0001    Shoulder Exercises: Supine   Horizontal ABduction Strengthening;Both;10 reps   Theraband Level (Shoulder Horizontal ABduction) Level 2 (Red)   External Rotation Strengthening;Both;10 reps   Theraband Level (Shoulder External Rotation) Level 2 (Red)   Flexion Strengthening;Both;10 reps;Theraband   Theraband Level (Shoulder Flexion) Level 2 (Red)   Flexion Limitations narrow and wide grip    Other Supine Exercises diagonal "sash" with red theraband 5 reps with each arm    Moist Heat Therapy   Number Minutes Moist Heat 20 Minutes   Moist Heat Location Other (comment)  both feet    Electrical Stimulation   Electrical Stimulation Location bilateral feet   Electrical Stimulation Action pre-mod   Electrical Stimulation Parameters 80-150 Hz ( intensity 11-14)   Electrical Stimulation Goals Pain  PT Education - 10/24/15 1337    Education Details supine scapular exercise with red theraband    Person(s) Educated Patient   Methods Explanation;Demonstration;Handout   Comprehension Verbalized understanding;Returned demonstration;Need further instruction                [facility] Goals - 10/10/15 1354    CC Long Term Goal  #3   Title [name]   Status On-going   CC Long Term Goal  #4   Title Patient will be able to do grocery shopping for >/= 45 minutes while pushing a cart.   Baseline Did 30 minutes last week pushing a cart.   Status Partially Met   CC Long Term Goal  #5   Title Patient will be able to walk >/= 20 minutes without rest without holding onto anything.   Baseline patient  estimates she could do 10-15 minutes at home now without grabbing onto anything   Status On-going            Plan - 10/24/15 1339    Clinical Impression Statement pt with increase pain today in feet. She was not able to perform standing exercise.  Was able to perform supine exercise for arms with rest periods in between.  She got some symptomatic relief from modalities, but continues to appear distressed due to pain    Clinical Impairments Affecting Rehab Potential CIPN previous chemotherapy    PT Next Visit Plan Mini tramp exercises if not pain in feet. ; Continue with visual focal point for balance activites. Do hip strengthening in supine and sidelying moist heat and electic stim to feet; soft tissue work to feet if beneficial.         Problem List Patient Active Problem List   Diagnosis Date Noted  . Weakness 04/20/2015  . Thyromegaly 12/14/2014  . Contact dermatitis 12/14/2014  . Colon cancer ([place]) 11/14/2014  . Rectal bleeding 08/11/2014  . Chest pain 2014/05/28  . SOB (shortness of breath) 05-28-14  . Loss or death of family member 06-09-2013  . Obesity 05/29/2009  Donato Heinz. Owens Shark, PT  10/24/2015, 5:34 PM  [place], [place], [address] Phone: [phone]   Fax:  [phone]  Name: Jenny [name] MRN: [id number] Date of Birth: 1961-06-30

## 2015-10-24 NOTE — Patient Instructions (Signed)
Over Head Pull: Narrow Grip       On back, knees bent, feet flat, band across thighs, elbows straight but relaxed. Pull hands apart (start). Keeping elbows straight, bring arms up and over head, hands toward floor. Keep pull steady on band. Hold momentarily. Return slowly, keeping pull steady, back to start. Repeat _5__ times. Band color _red_____    Aslo do this with wide grip  Side Pull: Double Arm   On back, knees bent, feet flat. Arms perpendicular to body, shoulder level, elbows straight but relaxed. Pull arms out to sides, elbows straight. Resistance band comes across collarbones, hands toward floor. Hold momentarily. Slowly return to starting position. Repeat _5__ times. Band color _red___   Sash   On back, knees bent, feet flat, left hand on left hip, right hand above left. Pull right arm DIAGONALLY (hip to shoulder) across chest. Bring right arm along head toward floor. Hold momentarily. Slowly return to starting position. Repeat _5__ times. Do with left arm. Band color ___red___  r Shoulder Rotation: Double Arm   On back, knees bent, feet flat, elbows tucked at sides, bent 90, hands palms up. Pull hands apart and down toward floor, keeping elbows near sides. Hold momentarily. Slowly return to starting position. Repeat _10__ times. Band color _red_____

## 2015-10-26 ENCOUNTER — Ambulatory Visit: Payer: BC Managed Care – PPO | Admitting: Physical Therapy

## 2015-10-26 DIAGNOSIS — G620 Drug-induced polyneuropathy: Secondary | ICD-10-CM

## 2015-10-26 DIAGNOSIS — M79672 Pain in left foot: Secondary | ICD-10-CM

## 2015-10-26 DIAGNOSIS — R269 Unspecified abnormalities of gait and mobility: Secondary | ICD-10-CM

## 2015-10-26 DIAGNOSIS — M79671 Pain in right foot: Secondary | ICD-10-CM | POA: Diagnosis not present

## 2015-10-26 DIAGNOSIS — T451X5A Adverse effect of antineoplastic and immunosuppressive drugs, initial encounter: Secondary | ICD-10-CM

## 2015-10-26 NOTE — Therapy (Signed)
[place], [place], [address] Phone: [phone]   Fax:  [phone]  Physical Therapy Treatment  Patient Details  Name: Jenny [name] MRN: [id number] Date of Birth: 27-Aug-1961 Referring Provider: Dr. Truitt Merle  Encounter Date: 10/26/2015      PT End of Session - 10/26/15 1626    Visit Number 20   Number of Visits 21   Date for PT Re-Evaluation 10/29/15   PT Start Time 1310   PT Stop Time 1400   PT Time Calculation (min) 50 min   Activity Tolerance Patient tolerated treatment well   Behavior During Therapy [facility] for tasks assessed/performed      Past Medical History  Diagnosis Date  . Enlarged thyroid   . Anemia   . Cancer [facility])     colon cancer     Past Surgical History  Procedure Laterality Date  . Tonsillectomy    . Cervical spine surgery  06/17/2012    C5-C7 ACDF  . Cesarean section    . Partial hysterectomy    . Portacath placement Left 12/22/2014    Procedure: INSERTION PORT-A-CATH LEFT SUBCLAVIAN;  Surgeon: [name], MD;  [facility];  Service: General;  Laterality: Left;  . Port-a-cath removal Left 07/28/2015    Procedure: REMOVAL PORT-A-CATH;  Surgeon: [name], MD;  [facility];  Service: General;  Laterality: Left;    There were no vitals filed for this visit.  Visit Diagnosis:  Pain in both feet  Peripheral neuropathy due to chemotherapy [facility])  Abnormality of gait      Subjective Assessment - 10/26/15 1313    Subjective "I feel better today"  She says she would like to find out about going to the [place] program at [facility] so she can keep working toward getting back to work. It would be much closer to her home and less stressfull than driving on [address]   Pertinent History Colon cancer diagnosed end of September 2015; did surgery in November to remove 12 inches of colon with 28 lymph nodes removed (one positive), stage III.   Adjuvant chemotherapy finished 04/2015.  Had neck disc surgery in approx. 2013 (with plate fusion) with fair outcome.  Has enlarged thyroid that is currentl being worked up.  Trying to adjust to pain from neuropathy.                                           Patient Stated Goals to be able to go back to work    Currently in Pain? Yes   Pain Score 5    Pain Location Foot   Pain Orientation Right;Left   Pain Descriptors / Indicators Burning;Tingling   Pain Type Neuropathic pain   Pain Radiating Towards to top of feet an up legs .                         [facility] Adult PT Treatment/Exercise - 10/26/15 0001    Knee/Hip Exercises: Aerobic   Nustep level 3 for 12 minutes.   shooting pain in feet with level 4    Other Aerobic scifit upper body exercise 2 minutes at level one    Knee/Hip Exercises: Standing   Other Standing Knee Exercises marching ambualtion x ~ 100 feet with effort to lift legs up  Shoulder Exercises: Standing   Other Standing Exercises flexion, bilateral shoulder extension, unilateral diagona,pull down, stand facing away and pull down,   Other Standing Exercises therapband low row with red theraband    Moist Heat Therapy   Number Minutes Moist Heat 15 Minutes   Moist Heat Location Other (comment)  both feet                         [facility] Goals - 10/10/15 1354    CC Long Term Goal  #3   Title [name]   Status On-going   CC Long Term Goal  #4   Title Patient will be able to do grocery shopping for >/= 45 minutes while pushing a cart.   Baseline Did 30 minutes last week pushing a cart.   Status Partially Met   CC Long Term Goal  #5   Title Patient will be able to walk >/= 20 minutes without rest without holding onto anything.   Baseline patient estimates she could do 10-15 minutes at home now without grabbing onto anything   Status On-going             Plan - 10/26/15 1627    Clinical Impression Statement Much better today.  Pt was able to tolerate 30 minutes of exercise with only a few rest breaks.  She is hoping to progress to fitness program at [facility] soon    Clinical Impairments Affecting Rehab Potential CIPN previous chemotherapy    PT Next Visit Plan continue with nustep, upper extremity aerobic exercise, standing exercise as tolerated, non weight bearing strenghening in feet are too painful     Consulted and Agree with Plan of Care Patient        Problem List Patient Active Problem List   Diagnosis Date Noted  . Weakness 04/20/2015  . Thyromegaly 12/14/2014  . Contact dermatitis 12/14/2014  . Colon cancer ([place]) 11/14/2014  . Rectal bleeding 08/11/2014  . Chest pain June 04, 2014  . SOB (shortness of breath) 06/04/2014  . Loss or death of family member 06/16/13  . Obesity 05/29/2009   Donato Heinz. Owens Shark, PT  10/26/2015, 4:30 PM  [place], [place], [address] Phone: [phone]   Fax:  [phone]  Name: Jenny [name] MRN: [id number] Date of Birth: 1961/01/30

## 2015-10-31 ENCOUNTER — Ambulatory Visit: Payer: BC Managed Care – PPO | Admitting: Physical Therapy

## 2015-10-31 DIAGNOSIS — G620 Drug-induced polyneuropathy: Secondary | ICD-10-CM

## 2015-10-31 DIAGNOSIS — M79671 Pain in right foot: Secondary | ICD-10-CM | POA: Diagnosis not present

## 2015-10-31 DIAGNOSIS — M79672 Pain in left foot: Secondary | ICD-10-CM

## 2015-10-31 DIAGNOSIS — R269 Unspecified abnormalities of gait and mobility: Secondary | ICD-10-CM

## 2015-10-31 DIAGNOSIS — T451X5A Adverse effect of antineoplastic and immunosuppressive drugs, initial encounter: Principal | ICD-10-CM

## 2015-10-31 NOTE — Therapy (Signed)
[place], [place], [address] Phone: [phone]   Fax:  [phone]  Physical Therapy Treatment  Patient Details  Name: Jenny [name] MRN: [id number] Date of Birth: 1961/08/24 Referring Provider: Dr. Truitt Merle  Encounter Date: 10/31/2015      PT End of Session - 10/31/15 1709    Visit Number 21   Number of Visits 25   Date for PT Re-Evaluation 11/14/15   PT Start Time 1307   PT Stop Time 1405   PT Time Calculation (min) 58 min   Activity Tolerance Patient limited by pain   Behavior During Therapy [facility] for tasks assessed/performed      Past Medical History  Diagnosis Date  . Enlarged thyroid   . Anemia   . Cancer [facility])     colon cancer     Past Surgical History  Procedure Laterality Date  . Tonsillectomy    . Cervical spine surgery  06/17/2012    C5-C7 ACDF  . Cesarean section    . Partial hysterectomy    . Portacath placement Left 12/22/2014    Procedure: INSERTION PORT-A-CATH LEFT SUBCLAVIAN;  Surgeon: [name], MD;  Location: [facility];  Service: General;  Laterality: Left;  . Port-a-cath removal Left 07/28/2015    Procedure: REMOVAL PORT-A-CATH;  Surgeon: [name], MD;  Location: [facility];  Service: General;  Laterality: Left;    There were no vitals filed for this visit.  Visit Diagnosis:  Peripheral neuropathy due to chemotherapy [facility]) - Plan: PT plan of care cert/re-cert  Abnormality of gait - Plan: PT plan of care cert/re-cert  Pain in both feet - Plan: PT plan of care cert/re-cert      Subjective Assessment - 10/31/15 1311    Subjective pt report she was hurting after last session, but wants to keep working on getting stronger, Overall she is feeling better.  She wants to go to the [employer] program at [facility] to continue working on her endurance .It is much closer to her home. She said it was OK to ask [facility] to fax release forms to Dr. Burr Medico.     Pertinent History Colon cancer diagnosed end of September 2015; did surgery in November to remove 12 inches of colon with 28 lymph nodes removed (one positive), stage III.  Adjuvant chemotherapy finished 04/2015.  Had neck disc surgery in approx. 2013 (with plate fusion) with fair outcome.  Has enlarged thyroid that is currentl being worked up.  Trying to adjust to pain from neuropathy.                                           Patient Stated Goals to be able to go back to work    Currently in Pain? Yes   Pain Score 5    Pain Location Foot   Pain Orientation Right;Left   Pain Descriptors / Indicators Burning;Tingling   Pain Type Neuropathic pain   Pain Radiating Towards top of the feet up the legs    Pain Onset More than a month ago   Pain Frequency Constant   Aggravating Factors  activity    Pain Relieving Factors moist heat and TNS at home    Effect of Pain on Daily Activities needs frequent rest breaks  [place] Adult PT Treatment/Exercise - 10/31/15 0001    High Level Balance   High Level Balance Comments resisted walking with theraband aroiund waist 5 steps forward, backward, and to each side.    Knee/Hip Exercises: Aerobic   Nustep level 3 for 12 minutes.   shooting pain in feet with level 4    Other Aerobic scifit upper body exercise 1 minute x 2 reps at level one    Shoulder Exercises: Standing   Other Standing Exercises free motion machine at top position, no weight, unilateral pull down with elbow straight.                         [facility] Goals - 10/31/15 1717    CC Long Term Goal  #1   Title Patient will be independent in self-management of pain using therapy techniques.   Status Achieved   CC Long Term Goal  #2   Title Patient will be knowledgeable about where and how to obtain equipment to help her with pain and mobility (such as a platform walker, TENS unit)   Status Achieved   CC Long Term Goal  #3    Title [name]   Status Not Met   CC Long Term Goal  #4   Title Patient will be able to do grocery shopping for >/= 45 minutes while pushing a cart.   Status On-going   CC Long Term Goal  #5   Title Patient will be able to walk >/= 20 minutes without rest without holding onto anything.   Status On-going   CC Long Term Goal  #6   Title pt will improve BERG score to 37/56 indicating improved balance and lower risk for fall    Status On-going            Plan - 10/31/15 1710    Clinical Impression Statement Pt  conitnues to have pain in feet that limits her activities, but is ready to transition to a community exercise program that is closer to her home.  She will continue to manage her pain at home with moist heat and her TENS unit, but wants to increase her endurance with her goal of returning back to work early next year.  Jenny [name] at [employer] at [facility] will fax the intake information to Dr. Burr Medico.  I will renew for 2 more weeks to assure that Jenny [name] has continuous exercise opportunities.    Pt will benefit from skilled therapeutic intervention in order to improve on the following deficits Pain;Difficulty walking;Decreased strength;Decreased mobility;Abnormal gait;Decreased endurance;Decreased activity tolerance;Decreased balance   Rehab Potential Good   Clinical Impairments Affecting Rehab Potential CIPN previous chemotherapy    PT Frequency 2x / week   PT Duration 2 weeks   PT Treatment/Interventions Manual techniques;Therapeutic exercise;Moist Heat   PT Next Visit Plan Repeat BERG and address goals continue with nustep, upper extremity aerobic exercise, standing exercise as tolerated, non weight bearing strenghening in feet are too painful     Consulted and Agree with Plan of Care Patient        Problem List Patient Active Problem List   Diagnosis Date Noted  . Weakness 04/20/2015  .  Thyromegaly 12/14/2014  . Contact dermatitis 12/14/2014  . Colon cancer ([place]) 11/14/2014  . Rectal bleeding 08/11/2014  . Chest pain 05/12/2014  . SOB (shortness of breath)  05/12/2014  . Loss or death of family member 06-15-13  . Obesity 05/29/2009   Donato Heinz. Owens Shark, PT  10/31/2015, 5:21 PM  [place], [place], [address] Phone: [phone]   Fax:  [phone]  Name: Jenny [name] MRN: [id number] Date of Birth: 02/23/1961

## 2015-11-02 ENCOUNTER — Ambulatory Visit: Payer: BC Managed Care – PPO | Admitting: Physical Therapy

## 2015-11-02 DIAGNOSIS — M79671 Pain in right foot: Secondary | ICD-10-CM

## 2015-11-02 DIAGNOSIS — G620 Drug-induced polyneuropathy: Secondary | ICD-10-CM

## 2015-11-02 DIAGNOSIS — R269 Unspecified abnormalities of gait and mobility: Secondary | ICD-10-CM

## 2015-11-02 DIAGNOSIS — T451X5A Adverse effect of antineoplastic and immunosuppressive drugs, initial encounter: Principal | ICD-10-CM

## 2015-11-02 DIAGNOSIS — M79672 Pain in left foot: Secondary | ICD-10-CM

## 2015-11-02 NOTE — Therapy (Signed)
[place], [place], [address] Phone: [phone]   Fax:  [phone]  Physical Therapy Treatment  Patient Details  Name: Jenny [name] MRN: [id number] Date of Birth: 10/21/1961 Referring Provider: Dr. Truitt Merle  Encounter Date: 11/02/2015      PT End of Session - 11/02/15 1313    Visit Number 22   Number of Visits 25   Date for PT Re-Evaluation 11/14/15   PT Start Time 1304   PT Stop Time 1355   PT Time Calculation (min) 51 min   Activity Tolerance Patient tolerated treatment well   Behavior During Therapy [facility] for tasks assessed/performed      Past Medical History  Diagnosis Date  . Enlarged thyroid   . Anemia   . Cancer [facility])     colon cancer     Past Surgical History  Procedure Laterality Date  . Tonsillectomy    . Cervical spine surgery  06/17/2012    C5-C7 ACDF  . Cesarean section    . Partial hysterectomy    . Portacath placement Left 12/22/2014    Procedure: INSERTION PORT-A-CATH LEFT SUBCLAVIAN;  Surgeon: [name], MD;  [facility];  Service: General;  Laterality: Left;  . Port-a-cath removal Left 07/28/2015    Procedure: REMOVAL PORT-A-CATH;  Surgeon: [name], MD;  [facility]: [facility];  Service: General;  Laterality: Left;    There were no vitals filed for this visit.  Visit Diagnosis:  Peripheral neuropathy due to chemotherapy [facility])  Abnormality of gait  Pain in both feet      Subjective Assessment - 11/02/15 1308    Subjective Pt reports she is feeling a bit better today . She is interested in going to the [employer] program but has not heard anything yet from them.    Pertinent History Colon cancer diagnosed end of September 2015; did surgery in November to remove 12 inches of colon with 28 lymph nodes removed (one positive), stage III.  Adjuvant chemotherapy finished 04/2015.  Had neck disc surgery in approx. 2013 (with plate fusion) with fair outcome.  Has  enlarged thyroid that is currentl being worked up.  Trying to adjust to pain from neuropathy.                                           Patient Stated Goals to be able to go back to work    Currently in Pain? Yes   Pain Score 4    Pain Orientation Right;Left   Pain Descriptors / Indicators Burning;Tingling   Pain Type Neuropathic pain   Pain Radiating Towards top of feet to legs    Pain Onset More than a month ago   Aggravating Factors  activity, especially weight bearing on legs    Pain Relieving Factors moist heat and TENS at home    Effect of Pain on Daily Activities needs frequent rest breaks             [facility] PT Assessment - 11/02/15 0001    Berg Balance Test   Sit to Stand Able to stand  independently using hands   Standing Unsupported Able to stand 2 minutes with supervision  trunk movement   Sitting with Back Unsupported but Feet Supported on Floor or Stool Able to sit safely and securely 2 minutes   Stand to Sit Controls descent  by using hands   Transfers Able to transfer safely, definite need of hands   Standing Unsupported with Eyes Closed Able to stand 10 seconds safely   Standing Ubsupported with Feet Together Able to place feet together independently and stand for 1 minute with supervision   From Standing, Reach Forward with Outstretched Arm Can reach forward >12 cm safely (5")   From Standing Position, Pick up Object from Floor Able to pick up shoe, needs supervision   From Standing Position, Turn to Look Behind Over each Shoulder Turn sideways only but maintains balance   Turn 360 Degrees Able to turn 360 degrees safely but slowly   Standing Unsupported, Alternately Place Feet on Step/Stool Able to complete 4 steps without aid or supervision   Standing Unsupported, One Foot in Front Needs help to step but can hold 15 seconds   Standing on One Leg Tries to lift leg/unable to hold 3 seconds but remains standing independently   Total Score 37                      [facility] Adult PT Treatment/Exercise - 11/02/15 0001    High Level Balance   High Level Balance Comments resisted walking with theraband aroiund waist 5 steps forward, backward, and to each side.    Knee/Hip Exercises: Aerobic   Nustep level 3 for 14 minutes.    Other Aerobic scifit upper body exercise 2 minute at level one    Shoulder Exercises: Standing   Other Standing Exercises free motion machine at top position, no weight, unilateral pull down with elbow straight.    Moist Heat Therapy   Number Minutes Moist Heat 15 Minutes   Moist Heat Location Other (comment)  both feet                         [facility] Goals - 11/02/15 1318    CC Long Term Goal  #1   Title Patient will be independent in self-management of pain using therapy techniques.   Status Achieved   CC Long Term Goal  #2   Title Patient will be knowledgeable about where and how to obtain equipment to help her with pain and mobility (such as a platform walker, TENS unit)   Status Achieved   CC Long Term Goal  #3   Title Patient will be able to return to work mid  January  2017 with reports of pain </= 3/10.   CC Long Term Goal  #4   Title Patient will be able to do grocery shopping for >/= 45 minutes while pushing a cart.  depends on how she feels, she walks too slow    Status On-going   CC Long Term Goal  #5   Title Patient will be able to walk >/= 20 minutes without rest without holding onto anything.   Status On-going   CC Long Term Goal  #6   Title pt will improve BERG score to 37/56 indicating improved balance and lower risk for fall    Baseline 37 on 11/02/2015   Status Achieved            Plan - 11/02/15 1314    Clinical Impression Statement Pt comes in today without assistive device and appears to be walking better with better speed. She had increased pain after last session, but it is better today.  On BERG balance testing, her score is improved  indicating  a decreased  risk for fall, but she still  had large amplitude movements of trunk and appearance of anxiety when doing challenging movments especially with narrow base of support .  I  feel she is ready to progress to community exercise program at [employer].  She will continue with exercise here unntil she transtions to that program  She will call to cancel scheduled appointments here when she has paperwork complete to start there.  Note with BERG results sent to Dr. Burr Medico   Rehab Potential Good   Clinical Impairments Affecting Rehab Potential CIPN previous chemotherapy    PT Frequency 2x / week   PT Next Visit Plan continue progression with nustep, upper extremity aerobic exercise, standing exercise as tolerated, non weight bearing strenghening in feet are too painful  until patient transitions to  [place] [facility] and Agree with Plan of Care Patient        Problem List Patient Active Problem List   Diagnosis Date Noted  . Weakness 04/20/2015  . Thyromegaly 12/14/2014  . Contact dermatitis 12/14/2014  . Colon cancer ([place]) 11/14/2014  . Rectal bleeding 08/11/2014  . Chest pain 09-Jun-2014  . SOB (shortness of breath) 06-09-2014  . Loss or death of family member 06/21/2013  . Obesity 05/29/2009   Donato Heinz. Owens Shark, PT   11/02/2015, 5:06 PM  [place], [place], [address] Phone: [phone]   Fax:  [phone]  Name: Jenny [name] MRN: [id number] Date of Birth: 07-Oct-1961

## 2015-11-07 ENCOUNTER — Encounter: Payer: Self-pay | Admitting: Hematology

## 2015-11-07 ENCOUNTER — Ambulatory Visit: Payer: BC Managed Care – PPO | Admitting: Physical Therapy

## 2015-11-07 DIAGNOSIS — R269 Unspecified abnormalities of gait and mobility: Secondary | ICD-10-CM

## 2015-11-07 DIAGNOSIS — M79671 Pain in right foot: Secondary | ICD-10-CM

## 2015-11-07 DIAGNOSIS — T451X5A Adverse effect of antineoplastic and immunosuppressive drugs, initial encounter: Principal | ICD-10-CM

## 2015-11-07 DIAGNOSIS — M79672 Pain in left foot: Secondary | ICD-10-CM

## 2015-11-07 DIAGNOSIS — G620 Drug-induced polyneuropathy: Secondary | ICD-10-CM

## 2015-11-07 NOTE — Therapy (Signed)
[place], [place], [address] Phone: [phone]   Fax:  [phone]  Physical Therapy Treatment  Patient Details  Name: Jenny [name] MRN: [id number] Date of Birth: [birth year] Referring Provider: Dr. Truitt Merle  Encounter Date: 11/07/2015      PT End of Session - 11/07/15 1703    Visit Number 23   Number of Visits 25   Date for PT Re-Evaluation 11/14/15   PT Start Time [id number]   PT Stop Time 1400   PT Time Calculation (min) 55 min   Activity Tolerance Patient tolerated treatment well   Behavior During Therapy [facility] for tasks assessed/performed      Past Medical History  Diagnosis Date  . Enlarged thyroid   . Anemia   . Cancer [facility])     colon cancer     Past Surgical History  Procedure Laterality Date  . Tonsillectomy    . Cervical spine surgery  06/17/2012    C5-C7 ACDF  . Cesarean section    . Partial hysterectomy    . Portacath placement Left 12/22/2014    Procedure: INSERTION PORT-A-CATH LEFT SUBCLAVIAN;  Surgeon: [name], MD;  [facility];  Service: General;  Laterality: Left;  . Port-a-cath removal Left 07/28/2015    Procedure: REMOVAL PORT-A-CATH;  Surgeon: [name], MD;  [facility];  Service: General;  Laterality: Left;    There were no vitals filed for this visit.  Visit Diagnosis:  Peripheral neuropathy due to chemotherapy [facility])  Abnormality of gait  Pain in both feet      Subjective Assessment - 11/07/15 1309    Subjective Pt states she is doing better with her eating and has lost about  9 or 10 pounds.    Pertinent History Colon cancer diagnosed end of September 2015; did surgery in November to remove 12 inches of colon with 28 lymph nodes removed (one positive), stage III.  Adjuvant chemotherapy finished 04/2015.  Had neck disc surgery in approx. 2013 (with plate fusion) with fair outcome.  Has enlarged thyroid that is currentl being worked up.  Trying to  adjust to pain from neuropathy.                                           Patient Stated Goals to be able to go back to work    Currently in Pain? Yes   Pain Score 4    Pain Location Foot   Pain Orientation Right;Left   Pain Descriptors / Indicators Burning;Tingling   Pain Type Neuropathic pain   Pain Onset More than a month ago   Pain Frequency Constant   Aggravating Factors  activity, especailly weight bearing on legs   Pain Relieving Factors moist heat and TENS at home                          [facility] Adult PT Treatment/Exercise - 11/07/15 0001    High Level Balance   High Level Balance Comments red physioball dribbling, bounce and catch on rebounder, bounce and catch on floor.    Pt assumed wide stance with knees semi bent for stablity.    Knee/Hip Exercises: Aerobic   Nustep level 3 for 15 minutes.    Other Aerobic scifit upper body exercise 2 minute at level one   better velocity today  Knee/Hip Exercises: Standing   Other Standing Knee Exercises attempted split stance and reaching up with UE ranger for reaching activites, but pt had problems with this challenge and had to sit down.    Shoulder Exercises: Standing   Other Standing Exercises free motion machine at top position, no weight, unilateral pull down with elbow straight.    Moist Heat Therapy   Number Minutes Moist Heat 15 Minutes   Moist Heat Location Other (comment)  both feet                         [facility] Goals - 11/02/15 1318    CC Long Term Goal  #1   Title Patient will be independent in self-management of pain using therapy techniques.   Status Achieved   CC Long Term Goal  #2   Title Patient will be knowledgeable about where and how to obtain equipment to help her with pain and mobility (such as a platform walker, TENS unit)   Status Achieved   CC Long Term Goal  #3   Title Patient will be able to return to work mid  January  2017 with reports of pain </= 3/10.    CC Long Term Goal  #4   Title Patient will be able to do grocery shopping for >/= 45 minutes while pushing a cart.  depends on how she feels, she walks too slow    Status On-going   CC Long Term Goal  #5   Title Patient will be able to walk >/= 20 minutes without rest without holding onto anything.   Status On-going   CC Long Term Goal  #6   Title pt will improve BERG score to 37/56 indicating improved balance and lower risk for fall    Baseline 37 on 11/02/2015   Status Achieved            Plan - 11/07/15 1703    Clinical Impression Statement Pt was able to increase the time she spent on the nustep and the speed she worked on the arm ergometer and perform more balance activities with a large ball.  She had difficulty with split stance activities as it narrowed her base of support.  Gave pt the phone number for [facility] and she will call them since she hasn't heard anything yet.    Clinical Impairments Affecting Rehab Potential CIPN previous chemotherapy    PT Next Visit Plan continue progression with nustep, upper extremity aerobic exercise, standing exercise as tolerated, non weight bearing strenghening in feet are too painful  until patient transitions to  [facility]         Problem List Patient Active Problem List   Diagnosis Date Noted  . Weakness 04/20/2015  . Thyromegaly 12/14/2014  . Contact dermatitis 12/14/2014  . Colon cancer ([place]) 11/14/2014  . Rectal bleeding 08/11/2014  . Chest pain 29-May-2014  . SOB (shortness of breath) May 29, 2014  . Loss or death of family member June 10, 2013  . Obesity 05/29/2009   Donato Heinz. Owens Shark, PT  11/07/2015, 5:07 PM  [place], [place], [address] Phone: [phone]   Fax:  [phone]  Name: Jenny [name] MRN: [id number] Date of Birth: 24-May-1961

## 2015-11-07 NOTE — Progress Notes (Signed)
I faxed office notes to disability [employer]  [phone]

## 2015-11-09 ENCOUNTER — Ambulatory Visit: Payer: BC Managed Care – PPO | Admitting: Physical Therapy

## 2015-11-13 ENCOUNTER — Telehealth: Payer: Self-pay | Admitting: *Deleted

## 2015-11-13 ENCOUNTER — Other Ambulatory Visit: Payer: Self-pay | Admitting: *Deleted

## 2015-11-13 DIAGNOSIS — C189 Malignant neoplasm of colon, unspecified: Secondary | ICD-10-CM

## 2015-11-13 NOTE — Telephone Encounter (Signed)
Spoke with pt and informed pt re:  Dr. Burr Medico will order a mammogram to be done since pt has not had one done within a year.  Pt will be scheduled for follow up after mammogram procedure.  Pt voiced understanding.

## 2015-11-13 NOTE — Telephone Encounter (Signed)
Call from [facility] reporting "yesterday morning while in shower, I found an area to left breast that's tender.  I don't know if it's a lump but it feels like a thickness where the breast connects to my chest going upwards at rib cage area.  I called [facility] and they do not have me in there system and told me to call my doctor for an order.  Does she need to see me, what do I do next?"    Unable to tell me where she's had previous mammograms stating "I do not know if I've had one."  Return number [phone]

## 2015-11-13 NOTE — Telephone Encounter (Signed)
Called Talbert Cage MD office [place] [phone].  Message left requesting if patient has ever had a mammogram and when.  Awaiting return call from [place].

## 2015-11-13 NOTE — Telephone Encounter (Signed)
Could you please contact her PCP office to see if she had mammogram before, when was the last one. I do not see any mammo in epic. Let me know. I will schedule one if she did not have one within past one year, then see me after.  Truitt Merle 11/13/2015

## 2015-11-14 ENCOUNTER — Ambulatory Visit: Payer: BC Managed Care – PPO | Admitting: Physical Therapy

## 2015-11-14 DIAGNOSIS — G620 Drug-induced polyneuropathy: Secondary | ICD-10-CM

## 2015-11-14 DIAGNOSIS — R269 Unspecified abnormalities of gait and mobility: Secondary | ICD-10-CM

## 2015-11-14 DIAGNOSIS — M79671 Pain in right foot: Secondary | ICD-10-CM | POA: Diagnosis not present

## 2015-11-14 DIAGNOSIS — T451X5A Adverse effect of antineoplastic and immunosuppressive drugs, initial encounter: Principal | ICD-10-CM

## 2015-11-14 DIAGNOSIS — M79672 Pain in left foot: Secondary | ICD-10-CM

## 2015-11-14 NOTE — Therapy (Signed)
[place], [place], [address] Phone: [phone]   Fax:  [phone]  Physical Therapy Treatment  Patient Details  Name: Jenny [name] MRN: [id number] Date of Birth: 11-19-1961 Referring Provider: Dr. Truitt Merle  Encounter Date: 11/14/2015      PT End of Session - 11/14/15 1342    Visit Number 24   Number of Visits 25   Date for PT Re-Evaluation 11/14/15   PT Start Time 1304   Activity Tolerance Patient tolerated treatment well   Behavior During Therapy [facility] for tasks assessed/performed      Past Medical History  Diagnosis Date  . Enlarged thyroid   . Anemia   . Cancer [facility])     colon cancer     Past Surgical History  Procedure Laterality Date  . Tonsillectomy    . Cervical spine surgery  06/17/2012    C5-C7 ACDF  . Cesarean section    . Partial hysterectomy    . Portacath placement Left 12/22/2014    Procedure: INSERTION PORT-A-CATH LEFT SUBCLAVIAN;  Surgeon: [name], MD;  Location: [facility];  Service: General;  Laterality: Left;  . Port-a-cath removal Left 07/28/2015    Procedure: REMOVAL PORT-A-CATH;  Surgeon: [name], MD;  Location: [facility];  Service: General;  Laterality: Left;    There were no vitals filed for this visit.  Visit Diagnosis:  Peripheral neuropathy due to chemotherapy [facility])  Abnormality of gait  Pain in both feet      Subjective Assessment - 11/14/15 1309    Subjective pt states she slipped on the steps last week.  She did not fall, but was sore from "catching" herself.  She has heard from the [facility] program and will go there on Tuesday.     Pertinent History Colon cancer diagnosed end of September 2015; did surgery in November to remove 12 inches of colon with 28 lymph nodes removed (one positive), stage III.  Adjuvant chemotherapy finished 04/2015.  Had neck disc surgery in approx. 2013 (with plate fusion) with fair outcome.  Has enlarged thyroid that is  currentl being worked up.  Trying to adjust to pain from neuropathy.                                           Patient Stated Goals to be able to go back to work    Currently in Pain? Yes   Pain Score 3    Pain Location Foot                         [facility] Adult PT Treatment/Exercise - 11/14/15 0001    High Level Balance   High Level Balance Comments red physioball dribbling, bounce and catch on rebounder, bounce and catch on floor.    Pt assumed wide stance with knees semi bent for stablity.    Knee/Hip Exercises: Aerobic   Nustep level 3 for 15 minutes.    Other Aerobic scifit upper body exercise 3 minute at level one   better velocity today with better tolerance    Knee/Hip Exercises: Seated   Knee/Hip Flexion 10 reps with core activation    Shoulder Exercises: Standing   Other Standing Exercises free motion machine at top position, no weight, unilateral pull down with elbow straight.    Moist Heat Therapy   Number  Minutes Moist Heat 15 Minutes   Moist Heat Location Other (comment)  both feet    Ankle Exercises: Seated   Other Seated Ankle Exercises heel raises                         Long Term Clinic Goals - 11/14/15 1313    CC Long Term Goal  #1   Title Patient will be independent in self-management of pain using therapy techniques.  using TENS unit and moist packs at home    Status Achieved   CC Long Term Goal  #2   Title Patient will be knowledgeable about where and how to obtain equipment to help her with pain and mobility (such as a platform walker, TENS unit)   Status Achieved   CC Long Term Goal  #3   Title Patient will be able to return to work mid  January  2017 with reports of pain </= 3/10.   Status Unable to assess   CC Long Term Goal  #4   Title Patient will be able to do grocery shopping for >/= 45 minutes while pushing a cart.  if she is having a good day, she could do this   Status Achieved   CC Long Term Goal  #5   Title  Patient will be able to walk >/= 20 minutes without rest without holding onto anything.   Status Partially Met   CC Long Term Goal  #6   Title pt will improve BERG score to 37/56 indicating improved balance and lower risk for fall    Status Achieved            Problem List Patient Active Problem List   Diagnosis Date Noted  . Weakness 04/20/2015  . Thyromegaly 12/14/2014  . Contact dermatitis 12/14/2014  . Colon cancer ([place]) 11/14/2014  . Rectal bleeding 08/11/2014  . Chest pain May 31, 2014  . SOB (shortness of breath) 05-31-2014  . Loss or death of family member 12-Jun-2013  . Obesity 05/29/2009   PHYSICAL THERAPY DISCHARGE SUMMARY  Visits from Start of Care: 24  Current functional level related to goals / functional outcomes: Pt feels improved and is ready to transition to community exercsise at [employer] at [facility] next week    Remaining deficits: Occasional balance loss, fatigue, gait impairment, neuropathy pain   Education / Equipment: Home exercise. Use of moist heat and TENS at home for pain management  Plan: Patient agrees to discharge.  Patient goals were partially met. Patient is being discharged due to the patient's request.  ?????         Donato Heinz. Owens Shark PT   Norwood Levo 11/14/2015, 1:44 PM  [place], [place], [address] Phone: [phone]   Fax:  [phone]  Name: Jenny [name] MRN: [id number] Date of Birth: [birth year]

## 2015-11-15 ENCOUNTER — Telehealth: Payer: Self-pay | Admitting: Hematology

## 2015-11-15 NOTE — Telephone Encounter (Signed)
cld & spoke to pt to give mamma appt/time/date-pt understood

## 2015-11-17 ENCOUNTER — Encounter: Payer: BC Managed Care – PPO | Admitting: Physical Therapy

## 2015-11-21 ENCOUNTER — Encounter: Payer: Self-pay | Admitting: Hematology

## 2015-11-21 ENCOUNTER — Ambulatory Visit: Payer: BC Managed Care – PPO

## 2015-11-21 ENCOUNTER — Telehealth: Payer: Self-pay | Admitting: *Deleted

## 2015-11-21 NOTE — Telephone Encounter (Signed)
"  Leron Croak from [facility] calling for referral from Dr. Burr Medico.  I need patient's home number to reach her."  Home number provided for [facility].

## 2015-11-21 NOTE — Progress Notes (Signed)
Faxed disability paperwork to [phone] copy to medical records.

## 2015-11-22 ENCOUNTER — Ambulatory Visit: Payer: BC Managed Care – PPO | Admitting: Family Medicine

## 2015-12-06 ENCOUNTER — Telehealth: Payer: Self-pay | Admitting: *Deleted

## 2015-12-06 ENCOUNTER — Ambulatory Visit (INDEPENDENT_AMBULATORY_CARE_PROVIDER_SITE_OTHER): Payer: BC Managed Care – PPO | Admitting: Family Medicine

## 2015-12-06 ENCOUNTER — Encounter: Payer: Self-pay | Admitting: Family Medicine

## 2015-12-06 ENCOUNTER — Ambulatory Visit
Admission: RE | Admit: 2015-12-06 | Discharge: 2015-12-06 | Disposition: A | Discharge status: Home / Self Care | Payer: BC Managed Care – PPO | Source: Ambulatory Visit | Attending: Hematology | Admitting: Hematology

## 2015-12-06 VITALS — Ht 64.0 in | Wt 261.0 lb

## 2015-12-06 DIAGNOSIS — E010 Iodine-deficiency related diffuse (endemic) goiter: Secondary | ICD-10-CM

## 2015-12-06 DIAGNOSIS — Z114 Encounter for screening for human immunodeficiency virus [HIV]: Secondary | ICD-10-CM

## 2015-12-06 DIAGNOSIS — Z1159 Encounter for screening for other viral diseases: Secondary | ICD-10-CM

## 2015-12-06 DIAGNOSIS — E049 Nontoxic goiter, unspecified: Secondary | ICD-10-CM

## 2015-12-06 DIAGNOSIS — R7309 Other abnormal glucose: Secondary | ICD-10-CM

## 2015-12-06 DIAGNOSIS — E119 Type 2 diabetes mellitus without complications: Secondary | ICD-10-CM

## 2015-12-06 DIAGNOSIS — C189 Malignant neoplasm of colon, unspecified: Secondary | ICD-10-CM

## 2015-12-06 LAB — POCT GLYCOSYLATED HEMOGLOBIN (HGB A1C): Hemoglobin A1C: 7.6

## 2015-12-06 NOTE — Telephone Encounter (Signed)
LM for patient to return call.  She has been scheduled for her ultrasound at [facility] on Tuesday 12/19/15.  She needs to arrive at 11am for her 11:15am appt.  I will also mail an appt card to her home.  Bodhi Stenglein,CMA

## 2015-12-06 NOTE — Patient Instructions (Addendum)
Good to see you today!  Thanks for coming in.  For the Thyroid  We will check your thyroid blood test   We will order an ultrasound  I will call you with the results and what we need to do next  For the Diabetes  Your A1c is 7.6 we would like it below 7.     Losing weight is the best treatment  We will refer you to diabetes education to learn more about it   We will check the A1c test again in 3 months  Come back in 3 months  Happy Holidays

## 2015-12-07 ENCOUNTER — Other Ambulatory Visit: Payer: Self-pay | Admitting: Hematology

## 2015-12-07 DIAGNOSIS — R928 Other abnormal and inconclusive findings on diagnostic imaging of breast: Secondary | ICD-10-CM

## 2015-12-07 DIAGNOSIS — E1165 Type 2 diabetes mellitus with hyperglycemia: Secondary | ICD-10-CM

## 2015-12-07 DIAGNOSIS — E119 Type 2 diabetes mellitus without complications: Secondary | ICD-10-CM | POA: Insufficient documentation

## 2015-12-07 LAB — TSH: TSH: 1.096 u[IU]/mL (ref 0.350–4.500)

## 2015-12-07 LAB — HIV ANTIBODY (ROUTINE TESTING W REFLEX): HIV 1&2 Ab, 4th Generation: NONREACTIVE

## 2015-12-07 LAB — HEPATITIS C ANTIBODY: HCV Ab: NEGATIVE

## 2015-12-07 NOTE — Progress Notes (Signed)
   Subjective:    Patient ID: Jenny [name], female    DOB: Apr 06, 1961, 54 y.o.   MRN: [id number]  HPI  Thyromegaly Has not followed up about this since her [name] in 2015 due to dx of colon cancer and treatment Feels like it has gotten worse.  Mild swallowing sensation but no food sticking or significant weight loss or shortness of breath or heat intolerance  DIABETES Disease Monitoring: Blood Sugar ranges(Severity) -not checking  Associated Symptoms- Polyuria/phagia/dipsia- no      Visual problems- no Medications: Compliance(Modifying factor) - no medications Hypoglycemic symptoms- no Timing - New problem - had an elevated A1c in 2015 but did not follow up until today due to diagnosis and tx of colon cancer  Chief Complaint noted Review of Symptoms - see HPI PMH - Smoking status noted.   Vital Signs reviewed   Review of Systems     Objective:   Physical Exam Alert nad Neck:  Diffusely enlarged thyroid gland nontender Supple with full range of motion without pain. Heart - Regular rate and rhythm.  No murmurs, gallops or rubs.    Lungs:  Normal respiratory effort, chest expands symmetrically. Lungs are clear to auscultation, no crackles or wheezes. Extremities:  No cyanosis, edema, or deformity noted with good range of motion of all major joints.           Assessment & Plan:

## 2015-12-07 NOTE — Assessment & Plan Note (Signed)
New onset.  Will try weight loss and diet control and refer to diabetes education

## 2015-12-07 NOTE — Telephone Encounter (Signed)
Spoke with patient's husband Lennette Bihari and he stated that patient might have called back yesterday to receive the appt information.  I gave it to him again so patient would have it in time.  Rida Loudin,CMA

## 2015-12-07 NOTE — Assessment & Plan Note (Signed)
Worsened Will repeat US since has been almost a year and check labs.   Will likely need bx

## 2015-12-14 ENCOUNTER — Ambulatory Visit
Admission: RE | Admit: 2015-12-14 | Discharge: 2015-12-14 | Disposition: A | Discharge status: Home / Self Care | Payer: BC Managed Care – PPO | Source: Ambulatory Visit | Attending: Hematology | Admitting: Hematology

## 2015-12-14 DIAGNOSIS — R928 Other abnormal and inconclusive findings on diagnostic imaging of breast: Secondary | ICD-10-CM

## 2015-12-19 ENCOUNTER — Ambulatory Visit (HOSPITAL_COMMUNITY)
Admission: RE | Admit: 2015-12-19 | Discharge: 2015-12-19 | Disposition: A | Discharge status: Home / Self Care | Payer: BC Managed Care – PPO | Source: Ambulatory Visit | Attending: Family Medicine | Admitting: Family Medicine

## 2015-12-19 DIAGNOSIS — E042 Nontoxic multinodular goiter: Secondary | ICD-10-CM | POA: Diagnosis not present

## 2015-12-19 DIAGNOSIS — E049 Nontoxic goiter, unspecified: Secondary | ICD-10-CM | POA: Diagnosis present

## 2015-12-19 DIAGNOSIS — E010 Iodine-deficiency related diffuse (endemic) goiter: Secondary | ICD-10-CM

## 2015-12-28 ENCOUNTER — Telehealth: Payer: Self-pay | Admitting: Family Medicine

## 2015-12-28 DIAGNOSIS — E010 Iodine-deficiency related diffuse (endemic) goiter: Secondary | ICD-10-CM

## 2015-12-29 ENCOUNTER — Ambulatory Visit (HOSPITAL_COMMUNITY)
Admission: RE | Admit: 2015-12-29 | Discharge: 2015-12-29 | Disposition: A | Discharge status: Home / Self Care | Payer: BC Managed Care – PPO | Source: Ambulatory Visit | Attending: Hematology | Admitting: Hematology

## 2015-12-29 ENCOUNTER — Encounter (HOSPITAL_COMMUNITY): Payer: Self-pay

## 2015-12-29 ENCOUNTER — Other Ambulatory Visit (HOSPITAL_BASED_OUTPATIENT_CLINIC_OR_DEPARTMENT_OTHER): Payer: BC Managed Care – PPO

## 2015-12-29 DIAGNOSIS — K760 Fatty (change of) liver, not elsewhere classified: Secondary | ICD-10-CM | POA: Insufficient documentation

## 2015-12-29 DIAGNOSIS — M47814 Spondylosis without myelopathy or radiculopathy, thoracic region: Secondary | ICD-10-CM | POA: Diagnosis not present

## 2015-12-29 DIAGNOSIS — M461 Sacroiliitis, not elsewhere classified: Secondary | ICD-10-CM | POA: Insufficient documentation

## 2015-12-29 DIAGNOSIS — Z9221 Personal history of antineoplastic chemotherapy: Secondary | ICD-10-CM | POA: Diagnosis not present

## 2015-12-29 DIAGNOSIS — C189 Malignant neoplasm of colon, unspecified: Secondary | ICD-10-CM | POA: Diagnosis not present

## 2015-12-29 DIAGNOSIS — M5136 Other intervertebral disc degeneration, lumbar region: Secondary | ICD-10-CM | POA: Diagnosis not present

## 2015-12-29 DIAGNOSIS — E041 Nontoxic single thyroid nodule: Secondary | ICD-10-CM | POA: Diagnosis not present

## 2015-12-29 DIAGNOSIS — C187 Malignant neoplasm of sigmoid colon: Secondary | ICD-10-CM

## 2015-12-29 DIAGNOSIS — M47816 Spondylosis without myelopathy or radiculopathy, lumbar region: Secondary | ICD-10-CM | POA: Insufficient documentation

## 2015-12-29 LAB — CBC & DIFF AND RETIC
BASO%: 0.4 % (ref 0.0–2.0)
Basophils Absolute: 0 10*3/uL (ref 0.0–0.1)
EOS%: 2.4 % (ref 0.0–7.0)
Eosinophils Absolute: 0.1 10*3/uL (ref 0.0–0.5)
HCT: 38.8 % (ref 34.8–46.6)
HGB: 12.5 g/dL (ref 11.6–15.9)
Immature Retic Fract: 16.5 % — ABNORMAL HIGH (ref 1.60–10.00)
LYMPH%: 48.7 % (ref 14.0–49.7)
MCH: 26.6 pg (ref 25.1–34.0)
MCHC: 32.2 g/dL (ref 31.5–36.0)
MCV: 82.6 fL (ref 79.5–101.0)
MONO#: 0.3 10*3/uL (ref 0.1–0.9)
MONO%: 4.7 % (ref 0.0–14.0)
NEUT#: 2.3 10*3/uL (ref 1.5–6.5)
NEUT%: 43.8 % (ref 38.4–76.8)
Platelets: 251 10*3/uL (ref 145–400)
RBC: 4.7 10*6/uL (ref 3.70–5.45)
RDW: 15.3 % — ABNORMAL HIGH (ref 11.2–14.5)
Retic %: 1.44 % (ref 0.70–2.10)
Retic Ct Abs: 67.68 10*3/uL (ref 33.70–90.70)
WBC: 5.3 10*3/uL (ref 3.9–10.3)
lymph#: 2.6 10*3/uL (ref 0.9–3.3)

## 2015-12-29 LAB — COMPREHENSIVE METABOLIC PANEL
ALT: 18 U/L (ref 0–55)
AST: 17 U/L (ref 5–34)
Albumin: 4.1 g/dL (ref 3.5–5.0)
Alkaline Phosphatase: 66 U/L (ref 40–150)
Anion Gap: 9 mEq/L (ref 3–11)
BUN: 11.9 mg/dL (ref 7.0–26.0)
CO2: 24 mEq/L (ref 22–29)
Calcium: 10 mg/dL (ref 8.4–10.4)
Chloride: 104 mEq/L (ref 98–109)
Creatinine: 0.8 mg/dL (ref 0.6–1.1)
EGFR: >90 mL/min/{1.73_m2} (ref >90)
Glucose: 139 mg/dl (ref 70–140)
Potassium: 4.3 mEq/L (ref 3.5–5.1)
Sodium: 138 mEq/L (ref 136–145)
Total Bilirubin: 0.38 mg/dL (ref 0.20–1.20)
Total Protein: 7.8 g/dL (ref 6.4–8.3)

## 2015-12-29 LAB — CEA: CEA: <0.5 ng/mL (ref 0.0–5.0)

## 2015-12-29 MED ORDER — IOHEXOL 300 MG/ML  SOLN
100.0000 mL | Freq: Once | INTRAMUSCULAR | Status: AC | PRN
  Administered 2015-12-29: 100 mL via INTRAVENOUS

## 2015-12-29 NOTE — Telephone Encounter (Signed)
Spoke with her about thyroid US and lab work  Recommend biopsy and she agrees  Put in referral for [employer]

## 2015-12-31 ENCOUNTER — Telehealth: Payer: Self-pay | Admitting: Hematology

## 2015-12-31 NOTE — Telephone Encounter (Signed)
DUE TO CALL MOVED 1/13 LAB/FU TO AN EARLIER TIME. SPOKE WITH PATIENT SHE IS AWARE.

## 2016-01-04 ENCOUNTER — Other Ambulatory Visit: Payer: Self-pay | Admitting: Family Medicine

## 2016-01-04 DIAGNOSIS — E010 Iodine-deficiency related diffuse (endemic) goiter: Secondary | ICD-10-CM

## 2016-01-05 ENCOUNTER — Telehealth: Payer: Self-pay | Admitting: Hematology

## 2016-01-05 ENCOUNTER — Encounter: Payer: Self-pay | Admitting: Hematology

## 2016-01-05 ENCOUNTER — Ambulatory Visit (HOSPITAL_BASED_OUTPATIENT_CLINIC_OR_DEPARTMENT_OTHER): Payer: BC Managed Care – PPO | Admitting: Hematology

## 2016-01-05 VITALS — BP 123/74 | HR 71 | Temp 98.5°F | Resp 17 | Ht 64.0 in | Wt 263.4 lb

## 2016-01-05 DIAGNOSIS — C187 Malignant neoplasm of sigmoid colon: Secondary | ICD-10-CM | POA: Diagnosis not present

## 2016-01-05 DIAGNOSIS — G620 Drug-induced polyneuropathy: Secondary | ICD-10-CM | POA: Diagnosis not present

## 2016-01-05 DIAGNOSIS — C772 Secondary and unspecified malignant neoplasm of intra-abdominal lymph nodes: Secondary | ICD-10-CM

## 2016-01-05 DIAGNOSIS — T451X5A Adverse effect of antineoplastic and immunosuppressive drugs, initial encounter: Secondary | ICD-10-CM

## 2016-01-05 NOTE — Progress Notes (Signed)
[place]  [phone] [phone]  Clinic Follow Up Note   Patient Care Team: Lind Covert, MD as PCP - General Juanita Craver, MD as Consulting Physician (Gastroenterology) [name], MD as Consulting Physician (General Surgery) Truitt Merle, MD as Consulting Physician (Hematology) 01/05/2016  CHIEF COMPLAINTS Follow up colon cancer   DIAGNOSIS:  Sigmoid colon cancer   Staging form: Colon and Rectum, AJCC 7th Edition     Clinical: Stage IIIB (T3, N1a, M0) - Signed by Truitt Merle, MD on 12/28/2014     Cancer of sigmoid colon metastatic to intra-abdominal lymph node ([place])   10/10/2014 Imaging CT abdomen and pelvis: Eccentric wall thickening along the sigmoid colon, worrisome for early colonic adenocarcinoma. No findings suspicious for metastatic disease. CT chest (-)      11/14/2014 Initial Diagnosis Colon cancer   11/14/2014 Pathologic Stage pT3pN1pMx, G2, tumor invading through the muscular propria into pericolonic fatty tissue.  LVI (-), PNI (-). 1/28 node positive, margins negative.    11/14/2014 Surgery sigmoid colon segmental resection, margins (-).    12/28/2014 - 05/17/2015 Adjuvant Chemotherapy [id number], 10% dose reduction due to neuropathy and cytopenia from cycle 5, oxaliplatin held from cycle 8 due to leg pain and neuropathy. chemo stopped after cycle 10 due to her tolerance issue.    12/29/2015 Imaging CT chest, abdomen and pelvis with contrast showed no evidence of recurrence.    HISTORY OF INITIAL PRESENTING ILLNESS:  Jenny [name] 55 y.o. female without significant past medical history, who is referred by her surgeon Dr. Marcello Moores to discuss adjuvant chemotherapy for her resected colon cancer.  She presented with abdominal pain for 8-9 month, which was related to position. She noticed bloody stool in Aug 2015. She was seen by PCP, and was referred to GI Dr. Collene Mares, and underwent colonoscopy which showed a mass at sigmoid colon (per  patient, I do not have the report). Biopsy showed adenocarcinoma. She was then referred to surgeon Dr. Marcello Moores and underwent sigmoid colon segmentectomy on 11/14/2014.   TREATMENT: observation  INTERIM HISTORY: Jenny [name] returns for follow-up. She has completed physical therapy, her leg pain and mobility have improved. She is able to tolerate routine daily activity, but feels very tired after 3-4 hours and does take naps in the morning and afternoon. She still has numbness and tingling on her fingers and toes, mild and stable, no balance issue, her hand function is grossly normal. No other new complaints.  MEDICAL HISTORY:  Past Medical History  Diagnosis Date  . Enlarged thyroid   . Anemia   . Cancer [facility])     colon cancer     SURGICAL HISTORY: Past Surgical History  Procedure Laterality Date  . Tonsillectomy    . Cervical spine surgery  06/17/2012    C5-C7 ACDF  . Cesarean section    . Partial hysterectomy    . Portacath placement Left 12/22/2014    Procedure: INSERTION PORT-A-CATH LEFT SUBCLAVIAN;  Surgeon: [name], MD;  Location: [facility];  Service: General;  Laterality: Left;  . Port-a-cath removal Left 07/28/2015    Procedure: REMOVAL PORT-A-CATH;  Surgeon: [name], MD;  Location: [facility];  Service: General;  Laterality: Left;    SOCIAL HISTORY: History   Social History  . Marital Status: Married    Spouse Name: N/A    Number of Children: 2  . Years of Education: N/A   Occupational History  . Not on file.   Social History Main Topics  .  Smoking status: Never Smoker   . Smokeless tobacco: Never Used  . Alcohol Use: No  . Drug Use: No  . Sexual Activity: Not on file    P2G2, one child was murdered. She is a Paramedic.   FAMILY HISTORY: Family History  Problem Relation Age of Onset  . Colon cancer Neg Hx   . Liver Cancer Brother        . Hypertension Mother     ALLERGIES:  is allergic to other and shellfish allergy.  MEDICATIONS:    Current Outpatient Prescriptions  Medication Sig Dispense Refill  . acetaminophen (TYLENOL) 500 MG tablet Take 500 mg by mouth every 6 (six) hours as needed.    . diphenhydrAMINE (BENADRYL) 12.5 MG/5ML liquid Take 12.5-25 mg by mouth every 4 (four) hours as needed for itching or allergies.    Marland Kitchen gabapentin (NEURONTIN) 300 MG capsule Take 1 capsule (300 mg total) by mouth 3 (three) times daily. 90 capsule 2  . Hydrocodone-Acetaminophen (VICODIN) 5-300 MG TABS Take 1 tablet by mouth every 21 ( twenty-one) days. (Patient taking differently: Take 1 tablet by mouth as needed. ) 5 each 0  . hydrocortisone cream 0.5 % Apply 1 application topically 2 (two) times daily as needed for itching.    . loratadine (CLARITIN) 10 MG tablet Take 10 mg by mouth daily. AS NEEDED    . Multiple Vitamin (MULTIVITAMIN) tablet Take 1 tablet by mouth daily.    . naproxen sodium (ANAPROX) 220 MG tablet Take 220 mg by mouth 2 (two) times daily with a meal.    . ondansetron (ZOFRAN) 8 MG tablet Take 1 by mouth twice a day for 2 days after chemotherapy and then twice daily as needed (Patient not taking: Reported on 12/06/2015) 30 tablet 1  . oxyCODONE (OXY IR/ROXICODONE) 5 MG immediate release tablet Take 1 tablet (5 mg total) by mouth every 6 (six) hours as needed for severe pain. 60 tablet 0  . triamcinolone (KENALOG) 0.025 % ointment Apply 1 application topically 2 (two) times daily. (Patient not taking: Reported on 12/06/2015) 30 g 1   No current facility-administered medications for this visit.    REVIEW OF SYSTEMS:   Constitutional: Denies fevers, chills or abnormal night sweats Eyes: Denies blurriness of vision, double vision or watery eyes Ears, nose, mouth, throat, and face: Denies mucositis or sore throat Respiratory: Denies cough, dyspnea or wheezes Cardiovascular: Denies palpitation, chest discomfort or lower extremity swelling Gastrointestinal:  Denies nausea, heartburn or change in bowel habits Skin: Denies  abnormal skin rashes Lymphatics: Denies new lymphadenopathy or easy bruising Neurological:Denies numbness, tingling or new weaknesses Behavioral/Psych: Mood is stable, no new changes  All other systems were reviewed with the patient and are negative.  PHYSICAL EXAMINATION: ECOG PERFORMANCE STATUS: 1 Vital sign was reviewed, normal.  GENERAL:alert, no distress and comfortable, obese  SKIN: skin color, texture, turgor are normal, no rashes or significant lesions EYES: normal, conjunctiva are pink and non-injected, sclera clear OROPHARYNX:no exudate, no erythema and lips, buccal mucosa, and tongue normal  NECK: supple, thyroid normal size, non-tender, without nodularity LYMPH:  no palpable lymphadenopathy in the cervical, axillary or inguinal LUNGS: clear to auscultation and percussion with normal breathing effort HEART: regular rate & rhythm and no murmurs and no lower extremity edema ABDOMEN:abdomen soft, non-tender and normal bowel sounds. Surgical wound has well-healed Musculoskeletal:no cyanosis of digits and no clubbing  PSYCH: alert & oriented x 3 with fluent speech NEURO: no focal motor/sensory deficits  LABORATORY DATA:  I have reviewed the data as listed CBC Latest Ref Rng 12/29/2015 10/13/2015 07/17/2015  WBC 3.9 - 10.3 10e3/uL 5.3 6.2 5.7  Hemoglobin 11.6 - 15.9 g/dL 12.5 13.2 12.2  Hematocrit 34.8 - 46.6 % 38.8 39.7 36.2  Platelets 145 - 400 10e3/uL 251 270 207    CMP Latest Ref Rng 12/29/2015 10/13/2015 07/17/2015  Glucose 70 - 140 mg/dl 139 137 154(H)  BUN 7.0 - 26.0 mg/dL 11.9 14.5 11.6  Creatinine 0.6 - 1.1 mg/dL 0.8 0.8 0.7  Sodium 136 - 145 mEq/L 138 140 138  Potassium 3.5 - 5.1 mEq/L 4.3 4.3 4.0  Chloride 96 - 112 mEq/L - - -  CO2 22 - 29 mEq/L _0 Calcium 8.4 - 10.4 mg/dL 10.0 10.5(H) 9.2  Total Protein 6.4 - 8.3 g/dL 7.8 7.8 6.9  Total Bilirubin 0.20 - 1.20 mg/dL 0.38 0.36 0.63  Alkaline Phos 40 - 150 U/L 66 64 56  AST 5 - 34 U/L _1 ALT 0 - 55 U/L  _2 CEA  Status: Finalresult Visible to patient:  Not Released Nextappt: Today at 01:00 PM in Oncology Burr Medico, Krista Blue, MD) Dx:  Colon cancer [facility])           Ref Range 7d ago  58moago  548mogo  [id number]     CEA 0.0 - 5.0 ng/mL <0.5 <0.5 <0.5 <0.5          PATHOLOGY REPORT 11/14/2014 Diagnosis(continued) 1. Colon, segmental resection for tumor, sigmoid - INVASIVE ADENOCARCINOMA, INVADING THROUGH THE MUSCULARIS PROPRIA INTO PERICOLONIC FATTY TISSUE. - ONE OF TWENTY-EIGHT LYMPH NODES, POSITIVE FOR METASTATIC CARCINOMA (1/28). - RESECTION MARGINS, NEGATIVE FOR ATYPIA OR MALIGNANCY - RESECTION MARGINS, NEGATIVE FOR ATYPIA OR MALIGNANCY. 2. Colon, resection margin (donut), colon final distal margin - BENIGN COLONIC MUCOSA, NO EVIDENCE OF MALIGNANCY. Microscopic Comment 1. COLON Specimen: Sigmoid colon Procedure: Segmental resection Tumor site: Sigmoid colon Specimen integrity: Intact Macroscopic intactness of mesorectum: N/A Macroscopic tumor perforation: No Invasive tumor: Maximum size: 2.7 cm, gross measurement Histologic type(s): Invasive adenocarcinoma Histologic grade and differentiation: G2: moderately differentiated/low grade Type of polyp tumor arose from: Tubular adenoma Microscopic extension of invasive tumor: Invading through the muscularis propria into pericolonic fatty tissue. Lymph-Vascular invasion: Not identified Peri-neural invasion: Not identified Tumor deposit(s) (discontinuous extramural extension): N/A Resection margins: Negative Open margin: 7.1 cm Stapled margin: 5.5 cm Mesenteric margin (sigmoid and transverse): 9 cm Treatment effect (neo-adjuvant therapy): No Additional polyp(s): N/A Non-neoplastic findings: N/A Lymph nodes: number examined 28; number positive: 1 Pathologic Staging: pT3, pN1a, pMX Ancillary studies: MMR stains will be performed and an addendum report will follow. MSI testing will be performed at an  outside institution and the results will be available in EPIC. (HCL:kh 11-15-14)  TUMOR MSI: stable (by PCR),  MMR: NORMAL   RADIOGRAPHIC STUDIES: I have personally reviewed the radiological images as listed and agreed with the findings in the report.  CT chest, abdomen and pelvis 12/28/2014 IMPRESSION: 1. No recurrent malignancy identified. 2. Hepatic steatosis. 3. Chronic bilateral symmetric sacroiliitis. 4. Thoracic and lumbar spondylosis with lumbar degenerative disc disease. 5. Stable appearance of right thyroid mass, as noted on prior thyroid ultrasound exams.  ASSESSMENT & PLAN:  [age]ar old African-American female, without significant past medical history, presents with abdominal pain and bloody bowel movement, anemia, was found to have a sigmoid adenocarcinoma, status post complete surgical resection.   1. Sigmoid colon adenocarcinoma, G2, pT3pN1aM0, stage IIIB, MSI stable  -the  nature history of colon cancer, and high risk of cancer recurrence. IIIB disease with previously discussed and reviewed with her -She is clinically doing well, still has some residual neuropathy and body aches from chemotherapy.  -Her physical exam today was normal. I reviewed her lab results, including CEA, which are all normal. -I discussed her repeat his CT scan from 12/29/2015, which showed NED -we'll continue surveillance. I'll see her every 3-4 months for the first 2 years, then every 6 months afterwards for total 5 years. We will check her lab and CEA was each visit, CT scan every 6-12 months. -her colonoscopy in 10/2015 was unremarkable   2.  Peripheral neuropathy, b/l leg and body pain, secondary to chemo -overall has improved. She takes Neurontin, Tylenol or ibuprofen as needed. -She has completed physical therapy and will continue exercise program.   3. Microcytic anemia, likely iron deficient anemia secondary to colon cancer, and chemotherapy related anemia -Her serum iron and saturation  were low. Likely from her colon cancer relates.  -Her anemia improved after IV Feraheme.  -anemia resolved now   4. Short-term disability -She is planning to go back to work on 01/15/2016, likely part time -She requests me to fill out her extension of disability paperwork  Plan  -Return to clinic and see me in 3 months with lab   All questions were answered. The patient knows to call the clinic with any problems,  questions or concerns.  I spent 20 minutes counseling the patient face to face. The total time spent in the appointment was 25 minutes and more than 50% was on counseling.     Truitt Merle, MD 01/05/2016 8:02 AM

## 2016-01-05 NOTE — Telephone Encounter (Signed)
GAVE PATIENT AVS REPORT AND APPOINTMENT FOR April.

## 2016-01-06 ENCOUNTER — Encounter: Payer: Self-pay | Admitting: Hematology

## 2016-01-06 DIAGNOSIS — T451X5A Adverse effect of antineoplastic and immunosuppressive drugs, initial encounter: Secondary | ICD-10-CM

## 2016-01-06 DIAGNOSIS — G620 Drug-induced polyneuropathy: Secondary | ICD-10-CM | POA: Insufficient documentation

## 2016-01-10 ENCOUNTER — Encounter: Payer: Self-pay | Admitting: Hematology

## 2016-01-10 NOTE — Progress Notes (Signed)
I placed forms on desk for dr. Burr Medico to sign

## 2016-01-10 NOTE — Progress Notes (Signed)
I called and left mess to let her know I faxed forms std  [phone]

## 2016-01-15 ENCOUNTER — Encounter: Payer: Self-pay | Admitting: Hematology

## 2016-01-15 NOTE — Progress Notes (Signed)
i placed form and note for dr. Burr Medico to sign. She needs note from dr. Burr Medico to say ok to go back to work 01/22/16,

## 2016-01-15 NOTE — Progress Notes (Signed)
Form/note left for dr. Burr Medico in box. sharepoint noted

## 2016-01-16 ENCOUNTER — Encounter: Payer: Self-pay | Admitting: Hematology

## 2016-01-16 NOTE — Progress Notes (Signed)
Patient wants form/letters faxed to [facility] and copy mailed to her. She asked about fmla I advised forms need to be faxed to me

## 2016-01-18 ENCOUNTER — Encounter: Payer: Self-pay | Admitting: Hematology

## 2016-01-18 NOTE — Progress Notes (Signed)
refaxed 7a with revised date 12/2016 to [facility]

## 2016-01-22 ENCOUNTER — Encounter: Payer: BC Managed Care – PPO | Attending: Family Medicine | Admitting: Skilled Nursing Facility1

## 2016-01-22 ENCOUNTER — Encounter: Payer: Self-pay | Admitting: Skilled Nursing Facility1

## 2016-01-22 VITALS — Ht 65.0 in | Wt 255.0 lb

## 2016-01-22 DIAGNOSIS — Z713 Dietary counseling and surveillance: Secondary | ICD-10-CM | POA: Insufficient documentation

## 2016-01-22 DIAGNOSIS — E119 Type 2 diabetes mellitus without complications: Secondary | ICD-10-CM

## 2016-01-22 NOTE — Progress Notes (Signed)
Diabetes Self-Management Education  Visit Type: First/Initial  Appt. Start Time: 3:30pm  Appt. End Time: 5:00pm  01/22/2016  [name], identified by name and date of birth, is a 55 y.o. female with a diagnosis of Diabetes: Type 2.   ASSESSMENT  Height 5\' 5"  (1.651 m), weight 255 lb (115.667 kg). Body mass index is 42.43 kg/(m^2). Pt is a colon cancer survivor from 21. Pt states she has had her colon resectioned. Pt states her physician states she has hypercholesterolemia. Pt states she has lost 5 pounds since her last doctors visit.      Diabetes Self-Management Education - 01/22/16 1557    Visit Information   Visit Type First/Initial   Initial Visit   Diabetes Type Type 2   Are you currently following a meal plan? No   Are you taking your medications as prescribed? Not on Medications   Date Diagnosed december 2016   Health Coping   How would you rate your overall health? Good   Psychosocial Assessment   Patient Belief/Attitude about Diabetes Denial   Self-management support Doctor's office   Patient Concerns Nutrition/Meal planning;Healthy Lifestyle   Special Needs None   Preferred Learning Style Auditory   Learning Readiness Contemplating   Complications   Last HgB A1C per patient/outside source 7.2 %   How often do you check your blood sugar? 0 times/day (not testing)   Have you had a dilated eye exam in the past 12 months? No   Have you had a dental exam in the past 12 months? No   Are you checking your feet? Yes   How many days per week are you checking your feet? 3   Dietary Intake   Breakfast egg whites, peppers, half glucerna   Snack (morning) other half glucerna-----half apple with peanut butter   Lunch tuna salad, green beans, broccoli   Snack (afternoon) none   Dinner BBQ chicken, cream of spinach with queso, vegetable   Snack (evening) carrots, celery------nuts----yogurt, berries, granola   Beverage(s) water, unsweet tea, zero calorie propel   Exercise   Exercise Type Light (walking / raking leaves)   How many days per week to you exercise? 2   How many minutes per day do you exercise? 60   Total minutes per week of exercise 120   Patient Education   Previous Diabetes Education No   Disease state  Definition of diabetes, type 1 and 2, and the diagnosis of diabetes;Factors that contribute to the development of diabetes   Nutrition management  Role of diet in the treatment of diabetes and the relationship between the three main macronutrients and blood glucose level;Food label reading, portion sizes and measuring food.;Carbohydrate counting;Information on hints to eating out and maintain blood glucose control.   Physical activity and exercise  Role of exercise on diabetes management, blood pressure control and cardiac health.   Monitoring Yearly dilated eye exam;Daily foot exams   Chronic complications Relationship between chronic complications and blood glucose control;Dental care;Assessed and discussed foot care and prevention of foot problems   Psychosocial adjustment Identified and addressed patients feelings and concerns about diabetes   Individualized Goals (developed by patient)   Nutrition Follow meal plan discussed;General guidelines for healthy choices and portions discussed   Physical Activity Exercise 1-2 times per week   Medications Not Applicable   Reducing Risk do foot checks daily;increase portions of healthy fats;increase portions of olive oil in diet;increase portions of nuts and seeds   Outcomes   Expected Outcomes  Demonstrated interest in learning. Expect positive outcomes   Future DMSE PRN   Program Status Completed      Individualized Plan for Diabetes Self-Management Training:   Learning Objective:  Patient will have a greater understanding of diabetes self-management. Patient education plan is to attend individual and/or group sessions per assessed needs and concerns.   Plan:   There are no Patient  Instructions on file for this visit.  Expected Outcomes:  Demonstrated interest in learning. Expect positive outcomes  Education material provided: Living Well with Diabetes, Meal plan card and Snack sheet  If problems or questions, patient to contact team via:  Phone  Future DSME appointment: PRN

## 2016-01-30 ENCOUNTER — Encounter: Payer: Self-pay | Admitting: Hematology

## 2016-01-30 NOTE — Progress Notes (Signed)
I faxed std forms to [facility]

## 2016-02-22 ENCOUNTER — Telehealth: Payer: Self-pay | Admitting: Hematology

## 2016-02-22 NOTE — Telephone Encounter (Signed)
Left message for pt notifying change in appt time and date.

## 2016-03-28 ENCOUNTER — Encounter: Payer: Self-pay | Admitting: Hematology

## 2016-03-28 NOTE — Progress Notes (Signed)
Faxes sent 01/10/16 01/30/16  01/16/16 I will send to medical records. These are done monthly

## 2016-04-05 ENCOUNTER — Ambulatory Visit: Payer: BC Managed Care – PPO | Admitting: Hematology

## 2016-04-05 ENCOUNTER — Other Ambulatory Visit: Payer: BC Managed Care – PPO

## 2016-04-12 ENCOUNTER — Other Ambulatory Visit (HOSPITAL_BASED_OUTPATIENT_CLINIC_OR_DEPARTMENT_OTHER): Payer: BC Managed Care – PPO

## 2016-04-12 ENCOUNTER — Telehealth: Payer: Self-pay | Admitting: Hematology

## 2016-04-12 ENCOUNTER — Ambulatory Visit (HOSPITAL_BASED_OUTPATIENT_CLINIC_OR_DEPARTMENT_OTHER): Payer: BC Managed Care – PPO | Admitting: Hematology

## 2016-04-12 ENCOUNTER — Encounter: Payer: Self-pay | Admitting: Hematology

## 2016-04-12 VITALS — BP 139/77 | HR 82 | Temp 98.4°F | Resp 18 | Ht 65.0 in | Wt 261.3 lb

## 2016-04-12 DIAGNOSIS — C187 Malignant neoplasm of sigmoid colon: Secondary | ICD-10-CM | POA: Diagnosis not present

## 2016-04-12 DIAGNOSIS — G620 Drug-induced polyneuropathy: Secondary | ICD-10-CM

## 2016-04-12 DIAGNOSIS — C772 Secondary and unspecified malignant neoplasm of intra-abdominal lymph nodes: Secondary | ICD-10-CM

## 2016-04-12 DIAGNOSIS — T451X5A Adverse effect of antineoplastic and immunosuppressive drugs, initial encounter: Secondary | ICD-10-CM

## 2016-04-12 DIAGNOSIS — G893 Neoplasm related pain (acute) (chronic): Secondary | ICD-10-CM | POA: Diagnosis not present

## 2016-04-12 DIAGNOSIS — C189 Malignant neoplasm of colon, unspecified: Secondary | ICD-10-CM

## 2016-04-12 LAB — CBC WITH DIFFERENTIAL/PLATELET
BASO%: 0.4 % (ref 0.0–2.0)
Basophils Absolute: 0 10*3/uL (ref 0.0–0.1)
EOS%: 1.6 % (ref 0.0–7.0)
Eosinophils Absolute: 0.1 10*3/uL (ref 0.0–0.5)
HCT: 38.7 % (ref 34.8–46.6)
HGB: 12.5 g/dL (ref 11.6–15.9)
LYMPH%: 35.5 % (ref 14.0–49.7)
MCH: 26 pg (ref 25.1–34.0)
MCHC: 32.2 g/dL (ref 31.5–36.0)
MCV: 80.8 fL (ref 79.5–101.0)
MONO#: 0.3 10*3/uL (ref 0.1–0.9)
MONO%: 4.1 % (ref 0.0–14.0)
NEUT#: 4 10*3/uL (ref 1.5–6.5)
NEUT%: 58.4 % (ref 38.4–76.8)
Platelets: 282 10*3/uL (ref 145–400)
RBC: 4.79 10*6/uL (ref 3.70–5.45)
RDW: 16 % — ABNORMAL HIGH (ref 11.2–14.5)
WBC: 6.8 10*3/uL (ref 3.9–10.3)
lymph#: 2.4 10*3/uL (ref 0.9–3.3)

## 2016-04-12 LAB — COMPREHENSIVE METABOLIC PANEL
ALT: 14 U/L (ref 0–55)
AST: 14 U/L (ref 5–34)
Albumin: 4 g/dL (ref 3.5–5.0)
Alkaline Phosphatase: 71 U/L (ref 40–150)
Anion Gap: 9 mEq/L (ref 3–11)
BUN: 10.4 mg/dL (ref 7.0–26.0)
CO2: 24 mEq/L (ref 22–29)
Calcium: 9.9 mg/dL (ref 8.4–10.4)
Chloride: 107 mEq/L (ref 98–109)
Creatinine: 0.8 mg/dL (ref 0.6–1.1)
EGFR: >90 mL/min/{1.73_m2} (ref >90)
Glucose: 164 mg/dl — ABNORMAL HIGH (ref 70–140)
Potassium: 4.1 mEq/L (ref 3.5–5.1)
Sodium: 140 mEq/L (ref 136–145)
Total Bilirubin: 0.32 mg/dL (ref 0.20–1.20)
Total Protein: 7.9 g/dL (ref 6.4–8.3)

## 2016-04-12 MED ORDER — GABAPENTIN 300 MG PO CAPS: 300.0000 mg | ORAL_CAPSULE | Freq: Three times a day (TID) | ORAL | Status: DC

## 2016-04-12 MED ORDER — HYDROCODONE-ACETAMINOPHEN 5-325 MG PO TABS: 1.0000 | ORAL_TABLET | Freq: Two times a day (BID) | ORAL | Status: DC | PRN

## 2016-04-12 NOTE — Progress Notes (Signed)
Jenny [name]  [phone] [phone]  Clinic Follow Up Note   Patient Care Team: Lind Covert, MD as PCP - General Juanita Craver, MD as Consulting Physician (Gastroenterology) [name], MD as Consulting Physician (General Surgery) Truitt Merle, MD as Consulting Physician (Hematology) 04/12/2016  CHIEF COMPLAINTS Follow up colon cancer   DIAGNOSIS:  Sigmoid colon cancer   Staging form: Colon and Rectum, AJCC 7th Edition     Clinical: Stage IIIB (T3, N1a, M0) - Signed by Truitt Merle, MD on 12/28/2014     Cancer of sigmoid colon metastatic to intra-abdominal lymph node ([place])   10/10/2014 Imaging CT abdomen and pelvis: Eccentric wall thickening along the sigmoid colon, worrisome for early colonic adenocarcinoma. No findings suspicious for metastatic disease. CT chest (-)      11/14/2014 Initial Diagnosis Colon cancer   11/14/2014 Pathologic Stage pT3pN1pMx, G2, tumor invading through the muscular propria into pericolonic fatty tissue.  LVI (-), PNI (-). 1/28 node positive, margins negative.    11/14/2014 Surgery sigmoid colon segmental resection, margins (-).    12/28/2014 - 05/17/2015 Adjuvant Chemotherapy [id number], 10% dose reduction due to neuropathy and cytopenia from cycle 5, oxaliplatin held from cycle 8 due to leg pain and neuropathy. chemo stopped after cycle 10 due to her tolerance issue.    12/29/2015 Imaging CT chest, abdomen and pelvis with contrast showed no evidence of recurrence.    HISTORY OF INITIAL PRESENTING ILLNESS:  Jenny [name] 55 y.o. female without significant past medical history, who is referred by her surgeon Dr. Marcello Moores to discuss adjuvant chemotherapy for her resected colon cancer.  She presented with abdominal pain for 8-9 month, which was related to position. She noticed bloody stool in Aug 2015. She was seen by PCP, and was referred to GI Dr. Collene Mares, and underwent colonoscopy which showed a mass at sigmoid colon (per  patient, I do not have the report). Biopsy showed adenocarcinoma. She was then referred to surgeon Dr. Marcello Moores and underwent sigmoid colon segmentectomy on 11/14/2014.   TREATMENT: observation  INTERIM HISTORY: Jenny [name] returns for follow-up. She is doing moderately well overall. Her main complaints is still body achiness, especially in her legs and arms  When she is physically active. She takes Neurontin 3 times a day , and Vicodin once or twice a week. She has completed physical therapy, but is still actively participating exercise program. She is not back to work yet,  Which  She feels she is not ready for. She otherwise feels well, has good appetite , denies any abdominal discomfort.  Her bowel movement is normal, no hematochezia.  MEDICAL HISTORY:  Past Medical History  Diagnosis Date  . Enlarged thyroid   . Anemia   . Cancer [facility])     colon cancer     SURGICAL HISTORY: Past Surgical History  Procedure Laterality Date  . Tonsillectomy    . Cervical spine surgery  06/17/2012    C5-C7 ACDF  . Cesarean section    . Partial hysterectomy    . Portacath placement Left 12/22/2014    Procedure: INSERTION PORT-A-CATH LEFT SUBCLAVIAN;  Surgeon: [name], MD;  Location: [facility];  Service: General;  Laterality: Left;  . Port-a-cath removal Left 07/28/2015    Procedure: REMOVAL PORT-A-CATH;  Surgeon: [name], MD;  Location: [facility];  Service: General;  Laterality: Left;    SOCIAL HISTORY: History   Social History  . Marital Status: Married    Spouse Name: N/A  Number of Children: 2  . Years of Education: N/A   Occupational History  . Not on file.   Social History Main Topics  . Smoking status: Never Smoker   . Smokeless tobacco: Never Used  . Alcohol Use: No  . Drug Use: No  . Sexual Activity: Not on file    P2G2, one child was murdered. She is a Paramedic.   FAMILY HISTORY: Family History  Problem Relation Age of Onset  . Colon cancer Neg Hx   .  Liver Cancer Brother        . Hypertension Mother     ALLERGIES:  is allergic to other and shellfish allergy.  MEDICATIONS:  Current Outpatient Prescriptions  Medication Sig Dispense Refill  . acetaminophen (TYLENOL) 500 MG tablet Take 500 mg by mouth every 6 (six) hours as needed.    . diphenhydrAMINE (BENADRYL) 12.5 MG/5ML liquid Take 12.5-25 mg by mouth every 4 (four) hours as needed for itching or allergies.    Marland Kitchen gabapentin (NEURONTIN) 300 MG capsule Take 1 capsule (300 mg total) by mouth 3 (three) times daily. 90 capsule 2  . Hydrocodone-Acetaminophen (VICODIN) 5-300 MG TABS Take 1 tablet by mouth every 21 ( twenty-one) days. (Patient taking differently: Take 1 tablet by mouth as needed. ) 5 each 0  . hydrocortisone cream 0.5 % Apply 1 application topically 2 (two) times daily as needed for itching.    . loratadine (CLARITIN) 10 MG tablet Take 10 mg by mouth daily. AS NEEDED    . Multiple Vitamin (MULTIVITAMIN) tablet Take 1 tablet by mouth daily.    . naproxen sodium (ANAPROX) 220 MG tablet Take 220 mg by mouth 2 (two) times daily with a meal.    . ondansetron (ZOFRAN) 8 MG tablet Take 1 by mouth twice a day for 2 days after chemotherapy and then twice daily as needed (Patient not taking: Reported on 12/06/2015) 30 tablet 1  . oxyCODONE (OXY IR/ROXICODONE) 5 MG immediate release tablet Take 1 tablet (5 mg total) by mouth every 6 (six) hours as needed for severe pain. 60 tablet 0  . triamcinolone (KENALOG) 0.025 % ointment Apply 1 application topically 2 (two) times daily. (Patient not taking: Reported on 12/06/2015) 30 g 1   No current facility-administered medications for this visit.    REVIEW OF SYSTEMS:   Constitutional: Denies fevers, chills or abnormal night sweats Eyes: Denies blurriness of vision, double vision or watery eyes Ears, nose, mouth, throat, and face: Denies mucositis or sore throat Respiratory: Denies cough, dyspnea or wheezes Cardiovascular: Denies palpitation,  chest discomfort or lower extremity swelling Gastrointestinal:  Denies nausea, heartburn or change in bowel habits Skin: Denies abnormal skin rashes Lymphatics: Denies new lymphadenopathy or easy bruising Neurological:Denies numbness, tingling or new weaknesses Behavioral/Psych: Mood is stable, no new changes  All other systems were reviewed with the patient and are negative.  PHYSICAL EXAMINATION: ECOG PERFORMANCE STATUS: 1 BP 139/77 mmHg  Pulse 82  Temp(Src) 98.4 F (36.9 C) (Oral)  Resp 18  Ht '5\' 5"'$  (1.651 m)  Wt 261 lb 4.8 oz (118.525 kg)  BMI 43.48 kg/m2  SpO2 96%  GENERAL:alert, no distress and comfortable, obese  SKIN: skin color, texture, turgor are normal, no rashes or significant lesions EYES: normal, conjunctiva are pink and non-injected, sclera clear OROPHARYNX:no exudate, no erythema and lips, buccal mucosa, and tongue normal  NECK: supple, thyroid normal size, non-tender, without nodularity LYMPH:  no palpable lymphadenopathy in the cervical, axillary or inguinal LUNGS: clear  to auscultation and percussion with normal breathing effort HEART: regular rate & rhythm and no murmurs and no lower extremity edema ABDOMEN:abdomen soft, non-tender and normal bowel sounds. Surgical wound has well-healed Musculoskeletal:no cyanosis of digits and no clubbing  PSYCH: alert & oriented x 3 with fluent speech NEURO: no focal motor/sensory deficits  LABORATORY DATA:  I have reviewed the data as listed CBC Latest Ref Rng 12/29/2015 10/13/2015 07/17/2015  WBC 3.9 - 10.3 10e3/uL 5.3 6.2 5.7  Hemoglobin 11.6 - 15.9 g/dL 12.5 13.2 12.2  Hematocrit 34.8 - 46.6 % 38.8 39.7 36.2  Platelets 145 - 400 10e3/uL 251 270 207    CMP Latest Ref Rng 12/29/2015 10/13/2015 07/17/2015  Glucose 70 - 140 mg/dl 139 137 154(H)  BUN 7.0 - 26.0 mg/dL 11.9 14.5 11.6  Creatinine 0.6 - 1.1 mg/dL 0.8 0.8 0.7  Sodium 136 - 145 mEq/L 138 140 138  Potassium 3.5 - 5.1 mEq/L 4.3 4.3 4.0  CO2 22 - 29 mEq/L '24 23  22  '$ Calcium 8.4 - 10.4 mg/dL 10.0 10.5(H) 9.2  Total Protein 6.4 - 8.3 g/dL 7.8 7.8 6.9  Total Bilirubin 0.20 - 1.20 mg/dL 0.38 0.36 0.63  Alkaline Phos 40 - 150 U/L 66 64 56  AST 5 - 34 U/L '17 20 18  '$ ALT 0 - 55 U/L '18 23 18   '$ CEA  Status: Finalresult Visible to patient:  Not Released Nextappt: Today at 01:00 PM in Oncology Burr Medico, Krista Blue, MD) Dx:  Colon cancer [facility])           Ref Range 7d ago  9moago  568mogo  [id number]     CEA 0.0 - 5.0 ng/mL <0.5 <0.5 <0.5 <0.5          PATHOLOGY REPORT 11/14/2014 Diagnosis(continued) 1. Colon, segmental resection for tumor, sigmoid - INVASIVE ADENOCARCINOMA, INVADING THROUGH THE MUSCULARIS PROPRIA INTO PERICOLONIC FATTY TISSUE. - ONE OF TWENTY-EIGHT LYMPH NODES, POSITIVE FOR METASTATIC CARCINOMA (1/28). - RESECTION MARGINS, NEGATIVE FOR ATYPIA OR MALIGNANCY - RESECTION MARGINS, NEGATIVE FOR ATYPIA OR MALIGNANCY. 2. Colon, resection margin (donut), colon final distal margin - BENIGN COLONIC MUCOSA, NO EVIDENCE OF MALIGNANCY. Microscopic Comment 1. COLON Specimen: Sigmoid colon Procedure: Segmental resection Tumor site: Sigmoid colon Specimen integrity: Intact Macroscopic intactness of mesorectum: N/A Macroscopic tumor perforation: No Invasive tumor: Maximum size: 2.7 cm, gross measurement Histologic type(s): Invasive adenocarcinoma Histologic grade and differentiation: G2: moderately differentiated/low grade Type of polyp tumor arose from: Tubular adenoma Microscopic extension of invasive tumor: Invading through the muscularis propria into pericolonic fatty tissue. Lymph-Vascular invasion: Not identified Peri-neural invasion: Not identified Tumor deposit(s) (discontinuous extramural extension): N/A Resection margins: Negative Open margin: 7.1 cm Stapled margin: 5.5 cm Mesenteric margin (sigmoid and transverse): 9 cm Treatment effect (neo-adjuvant therapy): No Additional polyp(s): N/A Non-neoplastic  findings: N/A Lymph nodes: number examined 28; number positive: 1 Pathologic Staging: pT3, pN1a, pMX Ancillary studies: MMR stains will be performed and an addendum report will follow. MSI testing will be performed at an outside institution and the results will be available in EPIC. (HCL:kh 11-15-14)  TUMOR MSI: stable (by PCR),  MMR: NORMAL   RADIOGRAPHIC STUDIES: I have personally reviewed the radiological images as listed and agreed with the findings in the report.  CT chest, abdomen and pelvis 12/28/2014 IMPRESSION: 1. No recurrent malignancy identified. 2. Hepatic steatosis. 3. Chronic bilateral symmetric sacroiliitis. 4. Thoracic and lumbar spondylosis with lumbar degenerative disc disease. 5. Stable appearance of right thyroid mass, as noted on prior thyroid ultrasound  exams.  ASSESSMENT & PLAN:  55 year old African-American female, without significant past medical history, presents with abdominal pain and bloody bowel movement, anemia, was found to have a sigmoid adenocarcinoma, status post complete surgical resection.   1. Sigmoid colon adenocarcinoma, G2, pT3pN1aM0, stage IIIB, MSI stable  -the nature history of colon cancer, and high risk of cancer recurrence. IIIB disease with previously discussed and reviewed with her -She is clinically doing well, still has some residual neuropathy and body aches from chemotherapy.  -Her physical exam today was normal. I reviewed her lab results, including CEA, which are all normal. -I discussed her repeat his CT scan from 12/29/2015, which showed NED -we'll continue surveillance. I'll see her every 3-4 months for the first 2 years, then every 6 months afterwards for total 5 years. We will check her lab and CEA was each visit, CT scan every 6-12 months. -her colonoscopy in 10/2015 was unremarkable ,done by Dr. Collene Mares  - her lab test today showed normal CBC and a CMP, CA result is still pending - She'll return in 3 months with lab and CT  scan.  2.  Peripheral neuropathy, b/l leg and body pain, secondary to chemo -overall has improved. She takes Neurontin, Tylenol or ibuprofen as needed,  And Vicodin once to twice  A week - We discussed pain clinically referral, she will think about it.  -She has completed physical therapy and will continue exercise program.    Plan  -lab reviewed with her -I refilled her  Neurontin and Vicodin -Return to clinic and see me in 3 months with lab and CT CAP with contrast   All questions were answered. The patient knows to call the clinic with any problems,  questions or concerns.  I spent 20 minutes counseling the patient face to face. The total time spent in the appointment was 25 minutes and more than 50% was on counseling.     Truitt Merle, MD 04/12/2016 8:31 AM

## 2016-04-12 NOTE — Telephone Encounter (Signed)
Gave pt appt & avs °

## 2016-04-13 LAB — CEA (PARALLEL TESTING): CEA: <0.5 ng/mL

## 2016-04-13 LAB — CEA: CEA: 2.3 ng/mL (ref 0.0–4.7)

## 2016-06-24 ENCOUNTER — Telehealth: Payer: Self-pay | Admitting: Hematology

## 2016-06-24 NOTE — Telephone Encounter (Signed)
returned call and s.w. pt and confirmed lab b4 ct scan.Marland KitchenMarland KitchenMarland KitchenMarland Kitchenpt ok and aware

## 2016-06-28 ENCOUNTER — Other Ambulatory Visit: Payer: BC Managed Care – PPO

## 2016-06-28 ENCOUNTER — Ambulatory Visit (HOSPITAL_COMMUNITY): Payer: BC Managed Care – PPO

## 2016-07-01 ENCOUNTER — Telehealth: Payer: Self-pay | Admitting: *Deleted

## 2016-07-01 ENCOUNTER — Ambulatory Visit (HOSPITAL_COMMUNITY)
Admission: RE | Admit: 2016-07-01 | Discharge: 2016-07-01 | Disposition: A | Discharge status: Home / Self Care | Payer: BC Managed Care – PPO | Source: Ambulatory Visit | Attending: Hematology | Admitting: Hematology

## 2016-07-01 ENCOUNTER — Other Ambulatory Visit (HOSPITAL_BASED_OUTPATIENT_CLINIC_OR_DEPARTMENT_OTHER): Payer: BC Managed Care – PPO

## 2016-07-01 DIAGNOSIS — K760 Fatty (change of) liver, not elsewhere classified: Secondary | ICD-10-CM | POA: Insufficient documentation

## 2016-07-01 DIAGNOSIS — E049 Nontoxic goiter, unspecified: Secondary | ICD-10-CM | POA: Insufficient documentation

## 2016-07-01 DIAGNOSIS — C772 Secondary and unspecified malignant neoplasm of intra-abdominal lymph nodes: Secondary | ICD-10-CM | POA: Insufficient documentation

## 2016-07-01 DIAGNOSIS — C187 Malignant neoplasm of sigmoid colon: Secondary | ICD-10-CM | POA: Diagnosis not present

## 2016-07-01 DIAGNOSIS — C189 Malignant neoplasm of colon, unspecified: Secondary | ICD-10-CM

## 2016-07-01 LAB — COMPREHENSIVE METABOLIC PANEL
ALT: 20 U/L (ref 0–55)
AST: 16 U/L (ref 5–34)
Albumin: 3.9 g/dL (ref 3.5–5.0)
Alkaline Phosphatase: 70 U/L (ref 40–150)
Anion Gap: 8 mEq/L (ref 3–11)
BUN: 14.8 mg/dL (ref 7.0–26.0)
CO2: 25 mEq/L (ref 22–29)
Calcium: 9.7 mg/dL (ref 8.4–10.4)
Chloride: 107 mEq/L (ref 98–109)
Creatinine: 1 mg/dL (ref 0.6–1.1)
EGFR: 78 mL/min/{1.73_m2} — ABNORMAL LOW (ref >90)
Glucose: 116 mg/dl (ref 70–140)
Potassium: 3.9 mEq/L (ref 3.5–5.1)
Sodium: 141 mEq/L (ref 136–145)
Total Bilirubin: 0.32 mg/dL (ref 0.20–1.20)
Total Protein: 7.9 g/dL (ref 6.4–8.3)

## 2016-07-01 LAB — CBC WITH DIFFERENTIAL/PLATELET
BASO%: 0.6 % (ref 0.0–2.0)
Basophils Absolute: 0 10*3/uL (ref 0.0–0.1)
EOS%: 2 % (ref 0.0–7.0)
Eosinophils Absolute: 0.1 10*3/uL (ref 0.0–0.5)
HCT: 39.2 % (ref 34.8–46.6)
HGB: 12.8 g/dL (ref 11.6–15.9)
LYMPH%: 40.7 % (ref 14.0–49.7)
MCH: 26.6 pg (ref 25.1–34.0)
MCHC: 32.7 g/dL (ref 31.5–36.0)
MCV: 81.3 fL (ref 79.5–101.0)
MONO#: 0.3 10*3/uL (ref 0.1–0.9)
MONO%: 4.4 % (ref 0.0–14.0)
NEUT#: 3.2 10*3/uL (ref 1.5–6.5)
NEUT%: 52.3 % (ref 38.4–76.8)
Platelets: 271 10*3/uL (ref 145–400)
RBC: 4.83 10*6/uL (ref 3.70–5.45)
RDW: 16.4 % — ABNORMAL HIGH (ref 11.2–14.5)
WBC: 6 10*3/uL (ref 3.9–10.3)
lymph#: 2.5 10*3/uL (ref 0.9–3.3)

## 2016-07-01 MED ORDER — IOPAMIDOL (ISOVUE-300) INJECTION 61%
100.0000 mL | Freq: Once | INTRAVENOUS | Status: AC | PRN
  Administered 2016-07-01: 100 mL via INTRAVENOUS

## 2016-07-01 NOTE — Telephone Encounter (Signed)
Call from patient stating "she is in traffic, will be a few minutes late today.  Estimated arrival 3:05 or 3:08 pm."

## 2016-07-01 NOTE — Telephone Encounter (Signed)
Message left in appointment comments.

## 2016-07-02 ENCOUNTER — Telehealth: Payer: Self-pay | Admitting: Hematology

## 2016-07-02 LAB — CEA: CEA: 2.1 ng/mL (ref 0.0–4.7)

## 2016-07-02 LAB — CEA (PARALLEL TESTING): CEA: <0.5 ng/mL

## 2016-07-02 NOTE — Telephone Encounter (Signed)
S/w pt, advised 7/14 appt moved to 7/17 due to md pal. Pt says she doesn't want to come in on 7/17 because it's her birthday. Moved appt to 7/19 @ 11.15am.

## 2016-07-05 ENCOUNTER — Ambulatory Visit: Payer: BC Managed Care – PPO | Admitting: Hematology

## 2016-07-08 ENCOUNTER — Ambulatory Visit: Payer: BC Managed Care – PPO | Admitting: Hematology

## 2016-07-10 ENCOUNTER — Encounter: Payer: Self-pay | Admitting: Hematology

## 2016-07-10 ENCOUNTER — Telehealth: Payer: Self-pay | Admitting: Hematology

## 2016-07-10 ENCOUNTER — Ambulatory Visit (HOSPITAL_BASED_OUTPATIENT_CLINIC_OR_DEPARTMENT_OTHER): Payer: BC Managed Care – PPO | Admitting: Hematology

## 2016-07-10 VITALS — BP 129/60 | HR 66 | Temp 98.6°F | Resp 18 | Ht 65.0 in | Wt 257.5 lb

## 2016-07-10 DIAGNOSIS — C772 Secondary and unspecified malignant neoplasm of intra-abdominal lymph nodes: Secondary | ICD-10-CM | POA: Diagnosis not present

## 2016-07-10 DIAGNOSIS — C187 Malignant neoplasm of sigmoid colon: Secondary | ICD-10-CM

## 2016-07-10 DIAGNOSIS — G620 Drug-induced polyneuropathy: Secondary | ICD-10-CM | POA: Diagnosis not present

## 2016-07-10 DIAGNOSIS — T451X5A Adverse effect of antineoplastic and immunosuppressive drugs, initial encounter: Secondary | ICD-10-CM

## 2016-07-10 NOTE — Progress Notes (Signed)
[place]  [phone] [phone]  Clinic Follow Up Note   Patient Care Team: Lind Covert, MD as PCP - General Juanita Craver, MD as Consulting Physician (Gastroenterology) [name], MD as Consulting Physician (General Surgery) Truitt Merle, MD as Consulting Physician (Hematology) 07/10/2016  CHIEF COMPLAINTS Follow up colon cancer   DIAGNOSIS:  Sigmoid colon cancer   Staging form: Colon and Rectum, AJCC 7th Edition     Clinical: Stage IIIB (T3, N1a, M0) - Signed by Truitt Merle, MD on 12/28/2014     Cancer of sigmoid colon metastatic to intra-abdominal lymph node ([place])   10/10/2014 Imaging CT abdomen and pelvis: Eccentric wall thickening along the sigmoid colon, worrisome for early colonic adenocarcinoma. No findings suspicious for metastatic disease. CT chest (-)      11/14/2014 Initial Diagnosis Colon cancer   11/14/2014 Pathologic Stage pT3pN1pMx, G2, tumor invading through the muscular propria into pericolonic fatty tissue.  LVI (-), PNI (-). 1/28 node positive, margins negative.    11/14/2014 Surgery sigmoid colon segmental resection, margins (-).    12/28/2014 - 05/17/2015 Adjuvant Chemotherapy [id number], 10% dose reduction due to neuropathy and cytopenia from cycle 5, oxaliplatin held from cycle 8 due to leg pain and neuropathy. chemo stopped after cycle 10 due to her tolerance issue.    12/29/2015 Imaging CT chest, abdomen and pelvis with contrast showed no evidence of recurrence.    HISTORY OF INITIAL PRESENTING ILLNESS:  Jenny [name] 55 y.o. female without significant past medical history, who is referred by her surgeon Dr. Marcello Moores to discuss adjuvant chemotherapy for her resected colon cancer.  She presented with abdominal pain for 8-9 month, which was related to position. She noticed bloody stool in Aug 2015. She was seen by PCP, and was referred to GI Dr. Collene Mares, and underwent colonoscopy which showed a mass at sigmoid colon (per  patient, I do not have the report). Biopsy showed adenocarcinoma. She was then referred to surgeon Dr. Marcello Moores and underwent sigmoid colon segmentectomy on 11/14/2014.   TREATMENT: observation  INTERIM HISTORY: Jenny [name] returns for follow-up. She is  Doing well overall.Her neuropathy and body pain has slightly improved. She is still on physical therapy, takes Neurontin, which helps. She has not been back to work,  She does not feel strong enough to do so. She otherwise denies any new symptoms, abdominal bloating, nausea,or change of her bowel habits. Her weight is stable.  MEDICAL HISTORY:  Past Medical History  Diagnosis Date  . Enlarged thyroid   . Anemia   . Cancer [facility])     colon cancer     SURGICAL HISTORY: Past Surgical History  Procedure Laterality Date  . Tonsillectomy    . Cervical spine surgery  06/17/2012    C5-C7 ACDF  . Cesarean section    . Partial hysterectomy    . Portacath placement Left 12/22/2014    Procedure: INSERTION PORT-A-CATH LEFT SUBCLAVIAN;  Surgeon: [name], MD;  Location: [facility];  Service: General;  Laterality: Left;  . Port-a-cath removal Left 07/28/2015    Procedure: REMOVAL PORT-A-CATH;  Surgeon: [name], MD;  Location: [facility];  Service: General;  Laterality: Left;    SOCIAL HISTORY: History   Social History  . Marital Status: Married    Spouse Name: N/A    Number of Children: 2  . Years of Education: N/A   Occupational History  . Not on file.   Social History Main Topics  . Smoking status: Never Smoker   .  Smokeless tobacco: Never Used  . Alcohol Use: No  . Drug Use: No  . Sexual Activity: Not on file    P2G2, one child was murdered. She is a Paramedic.   FAMILY HISTORY: Family History  Problem Relation Age of Onset  . Colon cancer Neg Hx   . Liver Cancer Brother        . Hypertension Mother     ALLERGIES:  is allergic to other and shellfish allergy.  MEDICATIONS:  Current Outpatient Prescriptions    Medication Sig Dispense Refill  . acetaminophen (TYLENOL) 500 MG tablet Take 500 mg by mouth every 6 (six) hours as needed.    . diphenhydrAMINE (BENADRYL) 12.5 MG/5ML liquid Take 12.5-25 mg by mouth every 4 (four) hours as needed for itching or allergies.    Marland Kitchen gabapentin (NEURONTIN) 300 MG capsule Take 1 capsule (300 mg total) by mouth 3 (three) times daily. 90 capsule 3  . HYDROcodone-acetaminophen (NORCO/VICODIN) 5-325 MG tablet Take 1 tablet by mouth every 12 (twelve) hours as needed for moderate pain. 30 tablet 0  . hydrocortisone cream 0.5 % Apply 1 application topically 2 (two) times daily as needed for itching.    . loratadine (CLARITIN) 10 MG tablet Take 10 mg by mouth daily. AS NEEDED    . Multiple Vitamin (MULTIVITAMIN) tablet Take 1 tablet by mouth daily.    . naproxen sodium (ANAPROX) 220 MG tablet Take 220 mg by mouth 2 (two) times daily with a meal.    . ondansetron (ZOFRAN) 8 MG tablet Take 1 by mouth twice a day for 2 days after chemotherapy and then twice daily as needed 30 tablet 1  . triamcinolone (KENALOG) 0.025 % ointment Apply 1 application topically 2 (two) times daily. 30 g 1   No current facility-administered medications for this visit.    REVIEW OF SYSTEMS:   Constitutional: Denies fevers, chills or abnormal night sweats Eyes: Denies blurriness of vision, double vision or watery eyes Ears, nose, mouth, throat, and face: Denies mucositis or sore throat Respiratory: Denies cough, dyspnea or wheezes Cardiovascular: Denies palpitation, chest discomfort or lower extremity swelling Gastrointestinal:  Denies nausea, heartburn or change in bowel habits Skin: Denies abnormal skin rashes Lymphatics: Denies new lymphadenopathy or easy bruising Neurological:Denies numbness, tingling or new weaknesses Behavioral/Psych: Mood is stable, no new changes  All other systems were reviewed with the patient and are negative.  PHYSICAL EXAMINATION: ECOG PERFORMANCE STATUS: 1 BP  129/60 mmHg  Pulse 66  Temp(Src) 98.6 F (37 C) (Oral)  Resp 18  Ht '5\' 5"'$  (1.651 m)  Wt 257 lb 8 oz (116.801 kg)  BMI 42.85 kg/m2  SpO2 100%  GENERAL:alert, no distress and comfortable, obese  SKIN: skin color, texture, turgor are normal, no rashes or significant lesions EYES: normal, conjunctiva are pink and non-injected, sclera clear OROPHARYNX:no exudate, no erythema and lips, buccal mucosa, and tongue normal  NECK: supple, thyroid normal size, non-tender, without nodularity LYMPH:  no palpable lymphadenopathy in the cervical, axillary or inguinal LUNGS: clear to auscultation and percussion with normal breathing effort HEART: regular rate & rhythm and no murmurs and no lower extremity edema ABDOMEN:abdomen soft, non-tender and normal bowel sounds. Surgical wound has well-healed Musculoskeletal:no cyanosis of digits and no clubbing  PSYCH: alert & oriented x 3 with fluent speech NEURO: no focal motor/sensory deficits  LABORATORY DATA:  I have reviewed the data as listed CBC Latest Ref Rng 07/01/2016 04/12/2016 12/29/2015  WBC 3.9 - 10.3 10e3/uL 6.0 6.8 5.3  Hemoglobin 11.6 - 15.9 g/dL 12.8 12.5 12.5  Hematocrit 34.8 - 46.6 % 39.2 38.7 38.8  Platelets 145 - 400 10e3/uL 271 282 251    CMP Latest Ref Rng 07/01/2016 04/12/2016 12/29/2015  Glucose 70 - 140 mg/dl 116 164(H) 139  BUN 7.0 - 26.0 mg/dL 14.8 10.4 11.9  Creatinine 0.6 - 1.1 mg/dL 1.0 0.8 0.8  Sodium 136 - 145 mEq/L 141 140 138  Potassium 3.5 - 5.1 mEq/L 3.9 4.1 4.3  CO2 22 - 29 mEq/L '25 24 24  '$ Calcium 8.4 - 10.4 mg/dL 9.7 9.9 10.0  Total Protein 6.4 - 8.3 g/dL 7.9 7.9 7.8  Total Bilirubin 0.20 - 1.20 mg/dL 0.32 0.32 0.38  Alkaline Phos 40 - 150 U/L 70 71 66  AST 5 - 34 U/L '16 14 17  '$ ALT 0 - 55 U/L '20 14 18   '$ CEA  Status: Finalresult Visible to patient:  Not Released Nextappt: Today at 01:00 PM in Oncology Burr Medico, Krista Blue, MD) Dx:  Colon cancer [facility])           Ref Range 7d ago  49moago  519mogo  [id number]     CEA 0.0 - 5.0 ng/mL <0.5 <0.5 <0.5 <0.5          PATHOLOGY REPORT 11/14/2014 Diagnosis(continued) 1. Colon, segmental resection for tumor, sigmoid - INVASIVE ADENOCARCINOMA, INVADING THROUGH THE MUSCULARIS PROPRIA INTO PERICOLONIC FATTY TISSUE. - ONE OF TWENTY-EIGHT LYMPH NODES, POSITIVE FOR METASTATIC CARCINOMA (1/28). - RESECTION MARGINS, NEGATIVE FOR ATYPIA OR MALIGNANCY - RESECTION MARGINS, NEGATIVE FOR ATYPIA OR MALIGNANCY. 2. Colon, resection margin (donut), colon final distal margin - BENIGN COLONIC MUCOSA, NO EVIDENCE OF MALIGNANCY. Microscopic Comment 1. COLON Specimen: Sigmoid colon Procedure: Segmental resection Tumor site: Sigmoid colon Specimen integrity: Intact Macroscopic intactness of mesorectum: N/A Macroscopic tumor perforation: No Invasive tumor: Maximum size: 2.7 cm, gross measurement Histologic type(s): Invasive adenocarcinoma Histologic grade and differentiation: G2: moderately differentiated/low grade Type of polyp tumor arose from: Tubular adenoma Microscopic extension of invasive tumor: Invading through the muscularis propria into pericolonic fatty tissue. Lymph-Vascular invasion: Not identified Peri-neural invasion: Not identified Tumor deposit(s) (discontinuous extramural extension): N/A Resection margins: Negative Open margin: 7.1 cm Stapled margin: 5.5 cm Mesenteric margin (sigmoid and transverse): 9 cm Treatment effect (neo-adjuvant therapy): No Additional polyp(s): N/A Non-neoplastic findings: N/A Lymph nodes: number examined 28; number positive: 1 Pathologic Staging: pT3, pN1a, pMX Ancillary studies: MMR stains will be performed and an addendum report will follow. MSI testing will be performed at an outside institution and the results will be available in EPIC. (HCL:kh 11-15-14)  TUMOR MSI: stable (by PCR),  MMR: NORMAL   RADIOGRAPHIC STUDIES: I have personally reviewed the radiological images as listed and agreed with the  findings in the report.  CT chest, abdomen and pelvis 07/01/2016 IMPRESSION: 1. Stable exam. No new or progressive findings to suggest recurrent/metastatic disease. 2. Hepatic steatosis. 3. Stable asymmetric enlargement right thyroid lobe, consistent with underlying nodule/mass.   ASSESSMENT & PLAN:  [age]ar old African-American female, without significant past medical history, presents with abdominal pain and bloody bowel movement, anemia, was found to have a sigmoid adenocarcinoma, status post complete surgical resection.   1. Sigmoid colon adenocarcinoma, G2, pT3pN1aM0, stage IIIB, MSI stable  -the nature history of colon cancer, and high risk of cancer recurrence. IIIB disease with previously discussed and reviewed with her -She is clinically doing well, still has some residual neuropathy and body aches from chemotherapy.  -Her physical exam today was normal. I  reviewed her lab results, including CEA, which are all normal. -I discussed her repeat his CT scan from 07/01/2016, which showed NED -we'll continue surveillance. I'll see her every 3-4 months for the first 2 years, then every 6 months afterwards for total 5 years. We will check her lab and CEA was each visit, CT scan every 6-12 months. -her colonoscopy in 10/2015 was unremarkable ,done by Dr. Collene Mares  - her lab test today showed normal CBC and CMP,  Her CEA has been in normal range also. - She is close to 2 years out of her diagnosis now, we discussed the risk of recurrence decreases after the first 2-3 years. - She'll return in 4 months with lab.  I'll repeat her CT scan in one year If she is clinically doing well.  2.  Peripheral neuropathy, b/l leg and body pain, secondary to chemo -overall has improved. She takes Neurontin, Tylenol or ibuprofen as needed,  And Vicodin once to twice  A week -She has completed physical therapy and will continue exercise program.   3. Obesity and  Hepatic steatosis - I encouraged her to lose  some weight, by diet control and exercise -she has mild fatty liver on CT scan,  I encouraged her to follow-up with her primary care physician and check her lipid profile at least once a year. I encouraged her to lose weight.  Plan  -lab and scan findings reviewed with her -Return to clinic and see me in 4 months with lab   All questions were answered. The patient knows to call the clinic with any problems,  questions or concerns.  I spent 20 minutes counseling the patient face to face. The total time spent in the appointment was 25 minutes and more than 50% was on counseling.     Truitt Merle, MD 07/10/2016 3:01 PM

## 2016-07-10 NOTE — Telephone Encounter (Signed)
Gave pt cal & avs °

## 2016-07-14 IMAGING — CT CT CHEST W/ CM
2 of 5 series · 16 of 46 positions shown, 18 images · IV contrast (OMNIPAQUE)
Comparison: Multiple exams, including 07/17/2015

CLINICAL DATA: Sigmoid colon cancer. Chemotherapy completed April 2015. Partial colectomy. Restaging assessment.

EXAM:
CT CHEST, ABDOMEN, AND PELVIS WITH CONTRAST
TECHNIQUE: Multidetector CT imaging of the chest, abdomen and pelvis was
performed following the standard protocol during bolus
administration of intravenous contrast.
CONTRAST:  100mL OMNIPAQUE IOHEXOL 300 MG/ML  SOLN

[Series 2: cap with st · axial · 0.78mm/px · z∈[-657,-97]mm · 13 of 127 slices shown, 15 images]
[im 8/127  soft-tissue]
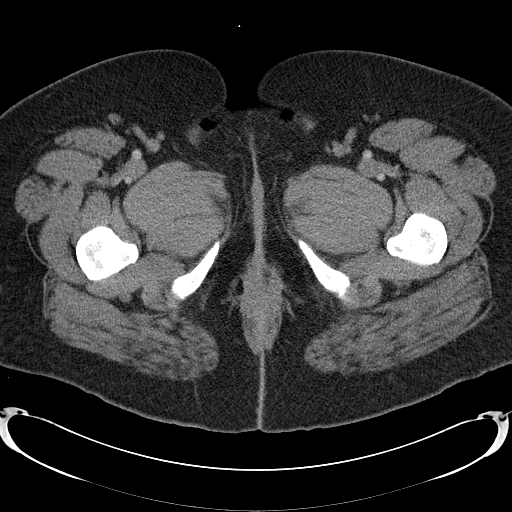
[im 8/127  bone]
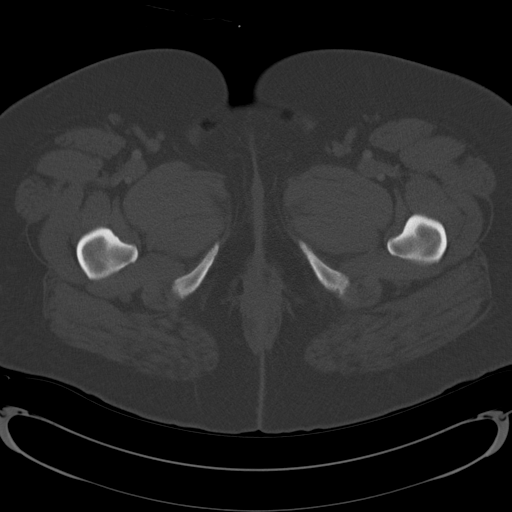
[im 15/127  soft-tissue]
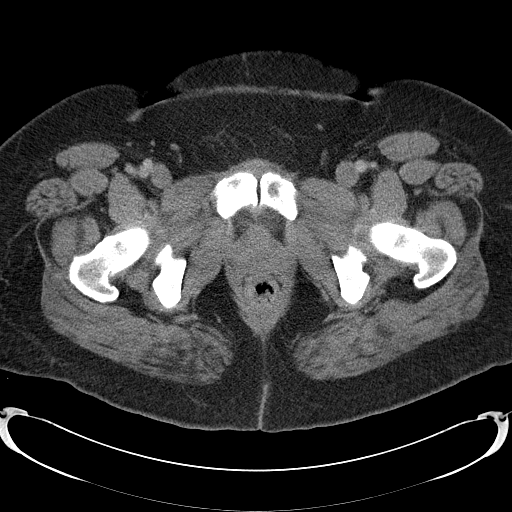
[im 29/127  soft-tissue]
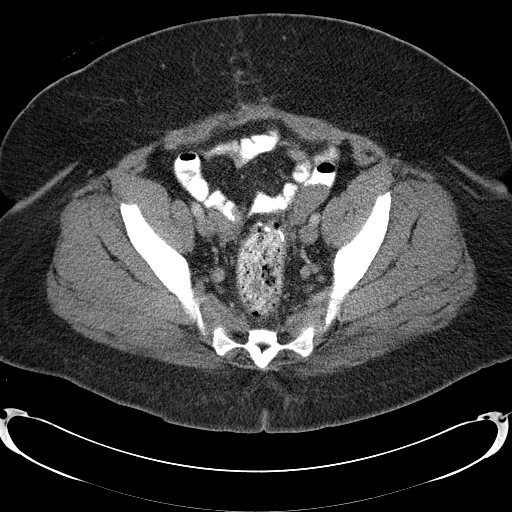
[im 36/127  soft-tissue]
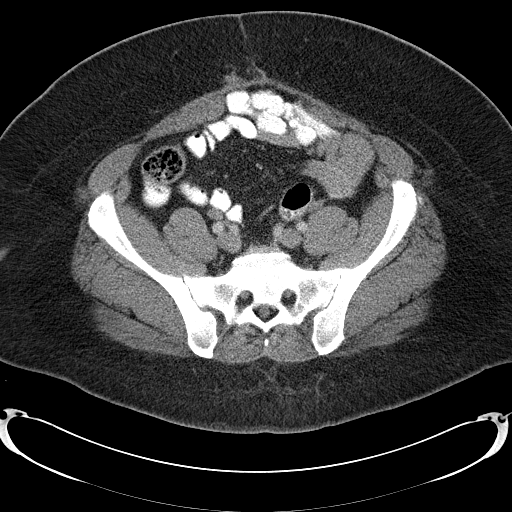
[im 43/127  soft-tissue]
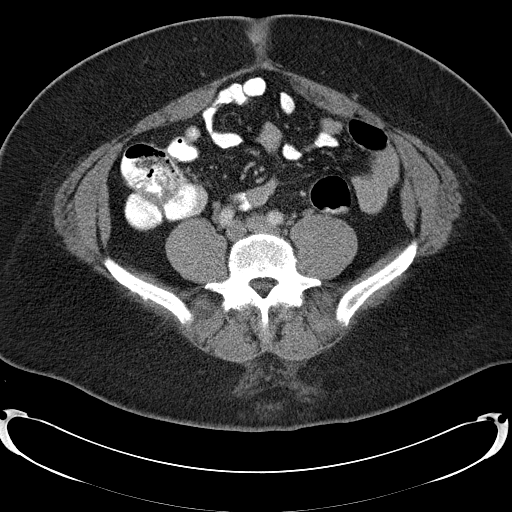
[im 57/127  soft-tissue]
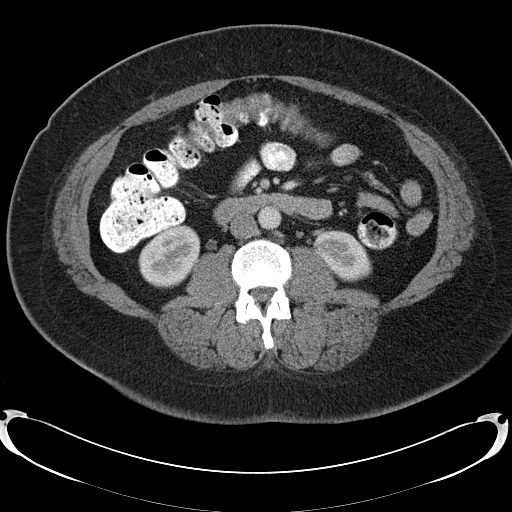
[im 64/127  soft-tissue]
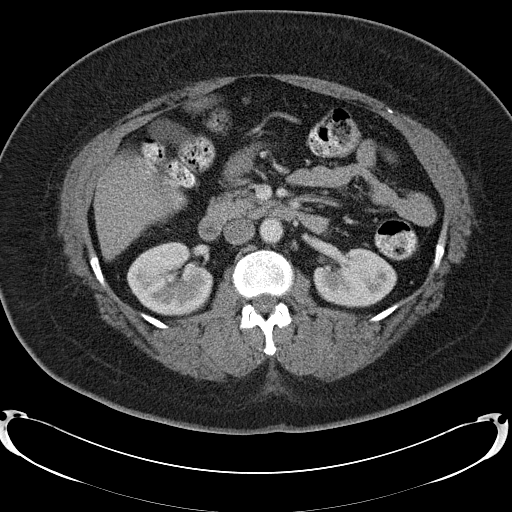
[im 71/127  soft-tissue]
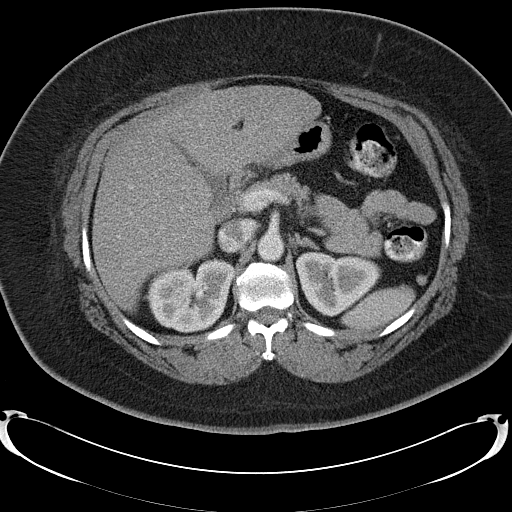
[im 85/127  soft-tissue]
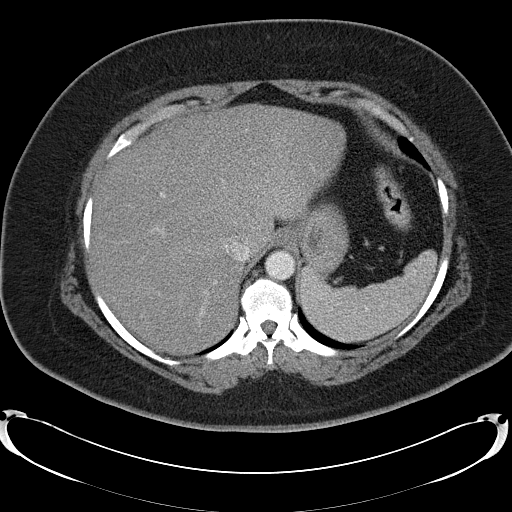
[im 85/127  bone]
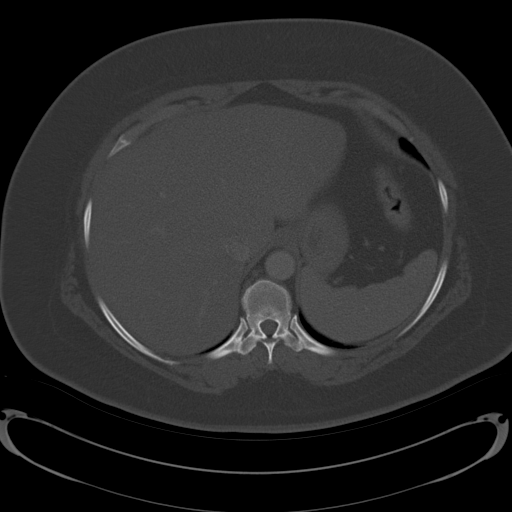
[im 92/127  soft-tissue]
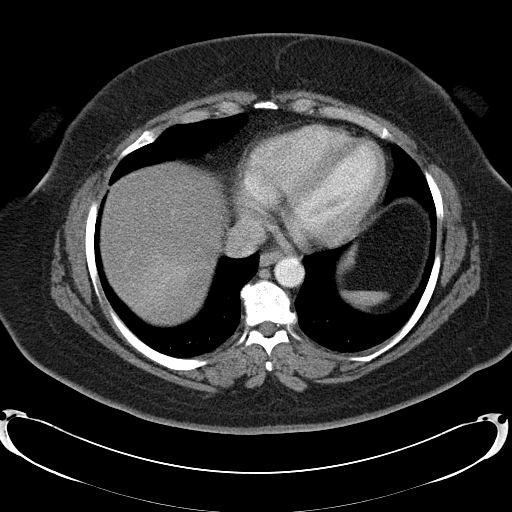
[im 99/127  soft-tissue]
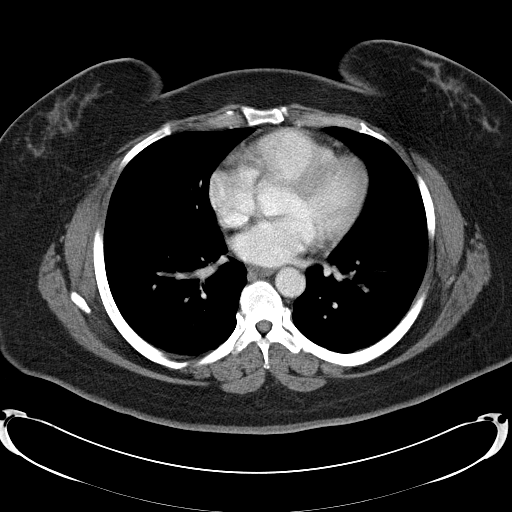
[im 113/127  soft-tissue]
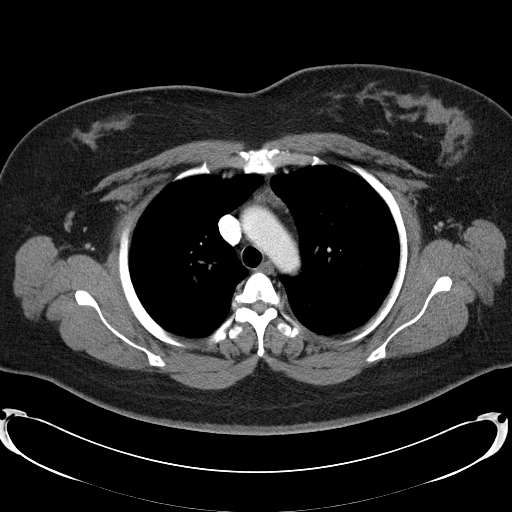
[im 120/127  soft-tissue]
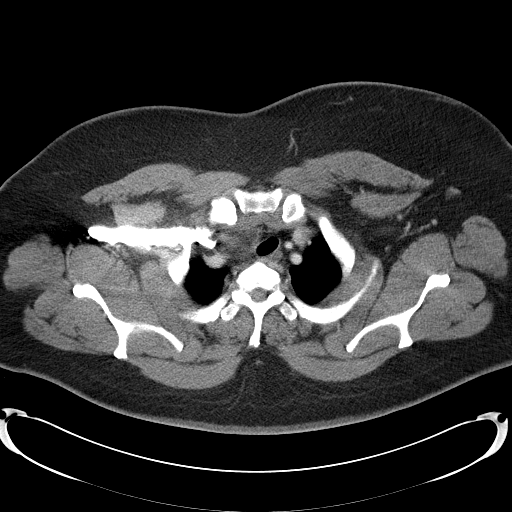

[Series 602: <mpr thick range> · coronal · 1.24mm/px · 3 of 102 slices shown]
[im 34/102  soft-tissue]
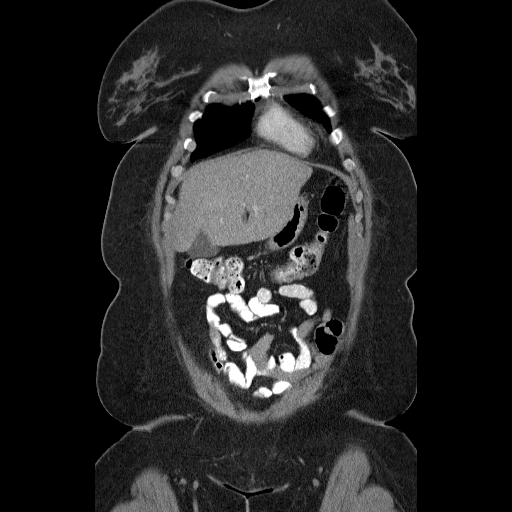
[im 45/102  soft-tissue]
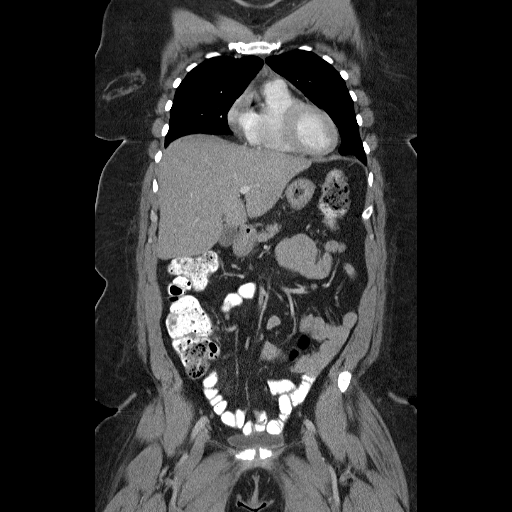
[im 57/102  soft-tissue]
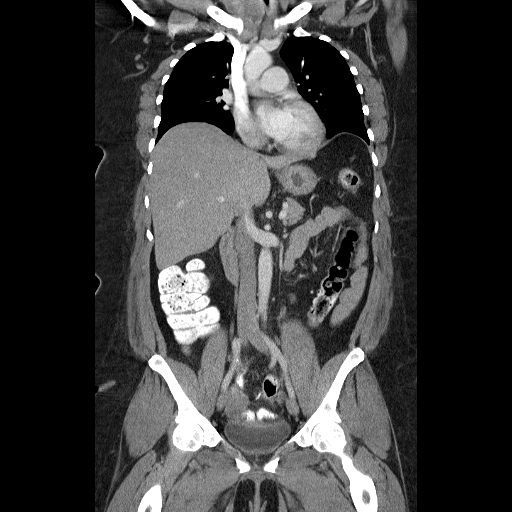

[16 of 46 positions shown; findings below may reference images not displayed]

FINDINGS: CT CHEST FINDINGS

Mediastinum/Nodes: Visualized portion of right thyroid mass measures
up to 4.6 by 2.6 cm on image 4 series 2, stable by my measurements
from 07/17/2015. This has been studied several times sonographically
and biopsy was previously recommended, correlate with any prior
biopsy results. The trachea is deviated to the left by this mass.

Small bilateral axillary lymph nodes are not pathologically
enlarged. No significant vascular abnormality.

Lungs/Pleura: Unremarkable

Musculoskeletal: Mild thoracic spondylosis.

CT ABDOMEN PELVIS FINDINGS

Hepatobiliary: Diffuse hepatic steatosis.  Gallbladder unremarkable.

Pancreas: Unremarkable

Spleen: Unremarkable

Adrenals/Urinary Tract: Unremarkable

Stomach/Bowel: Eft expected normal appearance of the anastomotic
staple line in the sigmoid colon, without local recurrence
identified.

Vascular/Lymphatic: Minimal aortoiliac atherosclerotic
calcification. No pathologic adenopathy.

Reproductive: Prior partial hysterectomy.  Ovaries unremarkable.

Other: No supplemental non-categorized findings.

Musculoskeletal: Symmetric sclerosis along both sacroiliac joints.
Stable appearance of considerable loss of disc height at the L3-4
level. Lumbar spondylosis and degenerative disc disease.
IMPRESSION: 1. No recurrent malignancy identified.
2. Hepatic steatosis.
3. Chronic bilateral symmetric sacroiliitis.
4. Thoracic and lumbar spondylosis with lumbar degenerative disc
disease.
5. Stable appearance of right thyroid mass, as noted on prior
thyroid ultrasound exams.

## 2016-08-02 NOTE — Telephone Encounter (Signed)
Closing encounter from 2016

## 2016-09-26 ENCOUNTER — Other Ambulatory Visit: Payer: Self-pay | Admitting: Hematology

## 2016-09-26 DIAGNOSIS — N6320 Unspecified lump in the left breast, unspecified quadrant: Secondary | ICD-10-CM

## 2016-10-02 ENCOUNTER — Other Ambulatory Visit: Payer: BC Managed Care – PPO

## 2016-10-09 ENCOUNTER — Ambulatory Visit
Admission: RE | Admit: 2016-10-09 | Discharge: 2016-10-09 | Disposition: A | Discharge status: Home / Self Care | Payer: BC Managed Care – PPO | Source: Ambulatory Visit | Attending: Hematology | Admitting: Hematology

## 2016-10-09 DIAGNOSIS — N6320 Unspecified lump in the left breast, unspecified quadrant: Secondary | ICD-10-CM

## 2016-11-11 ENCOUNTER — Other Ambulatory Visit (HOSPITAL_BASED_OUTPATIENT_CLINIC_OR_DEPARTMENT_OTHER): Payer: BC Managed Care – PPO

## 2016-11-11 ENCOUNTER — Encounter: Payer: Self-pay | Admitting: Hematology

## 2016-11-11 ENCOUNTER — Ambulatory Visit (HOSPITAL_BASED_OUTPATIENT_CLINIC_OR_DEPARTMENT_OTHER): Payer: BC Managed Care – PPO | Admitting: Hematology

## 2016-11-11 ENCOUNTER — Telehealth: Payer: Self-pay | Admitting: Hematology

## 2016-11-11 ENCOUNTER — Other Ambulatory Visit: Payer: Self-pay | Admitting: Hematology

## 2016-11-11 VITALS — BP 139/79 | HR 64 | Temp 98.0°F | Resp 18 | Ht 65.0 in | Wt 258.7 lb

## 2016-11-11 DIAGNOSIS — C187 Malignant neoplasm of sigmoid colon: Secondary | ICD-10-CM

## 2016-11-11 DIAGNOSIS — T451X5A Adverse effect of antineoplastic and immunosuppressive drugs, initial encounter: Secondary | ICD-10-CM

## 2016-11-11 DIAGNOSIS — E119 Type 2 diabetes mellitus without complications: Secondary | ICD-10-CM

## 2016-11-11 DIAGNOSIS — G620 Drug-induced polyneuropathy: Secondary | ICD-10-CM | POA: Diagnosis not present

## 2016-11-11 DIAGNOSIS — C772 Secondary and unspecified malignant neoplasm of intra-abdominal lymph nodes: Secondary | ICD-10-CM

## 2016-11-11 DIAGNOSIS — E6609 Other obesity due to excess calories: Secondary | ICD-10-CM

## 2016-11-11 DIAGNOSIS — K760 Fatty (change of) liver, not elsewhere classified: Secondary | ICD-10-CM

## 2016-11-11 DIAGNOSIS — R0789 Other chest pain: Secondary | ICD-10-CM

## 2016-11-11 DIAGNOSIS — IMO0001 Reserved for inherently not codable concepts without codable children: Secondary | ICD-10-CM

## 2016-11-11 LAB — CBC WITH DIFFERENTIAL/PLATELET
BASO%: 0.6 % (ref 0.0–2.0)
Basophils Absolute: 0 10*3/uL (ref 0.0–0.1)
EOS%: 2 % (ref 0.0–7.0)
Eosinophils Absolute: 0.1 10*3/uL (ref 0.0–0.5)
HCT: 38.8 % (ref 34.8–46.6)
HGB: 12.5 g/dL (ref 11.6–15.9)
LYMPH%: 45.4 % (ref 14.0–49.7)
MCH: 26.2 pg (ref 25.1–34.0)
MCHC: 32.3 g/dL (ref 31.5–36.0)
MCV: 81.1 fL (ref 79.5–101.0)
MONO#: 0.2 10*3/uL (ref 0.1–0.9)
MONO%: 4.3 % (ref 0.0–14.0)
NEUT#: 2.8 10*3/uL (ref 1.5–6.5)
NEUT%: 47.7 % (ref 38.4–76.8)
Platelets: 276 10*3/uL (ref 145–400)
RBC: 4.78 10*6/uL (ref 3.70–5.45)
RDW: 15.7 % — ABNORMAL HIGH (ref 11.2–14.5)
WBC: 5.8 10*3/uL (ref 3.9–10.3)
lymph#: 2.6 10*3/uL (ref 0.9–3.3)

## 2016-11-11 LAB — COMPREHENSIVE METABOLIC PANEL
ALT: 19 U/L (ref 0–55)
AST: 17 U/L (ref 5–34)
Albumin: 3.9 g/dL (ref 3.5–5.0)
Alkaline Phosphatase: 77 U/L (ref 40–150)
Anion Gap: 9 mEq/L (ref 3–11)
BUN: 11.2 mg/dL (ref 7.0–26.0)
CO2: 24 mEq/L (ref 22–29)
Calcium: 9.6 mg/dL (ref 8.4–10.4)
Chloride: 109 mEq/L (ref 98–109)
Creatinine: 0.8 mg/dL (ref 0.6–1.1)
EGFR: >90 mL/min/{1.73_m2} (ref >90)
Glucose: 126 mg/dl (ref 70–140)
Potassium: 4.2 mEq/L (ref 3.5–5.1)
Sodium: 141 mEq/L (ref 136–145)
Total Bilirubin: 0.34 mg/dL (ref 0.20–1.20)
Total Protein: 8 g/dL (ref 6.4–8.3)

## 2016-11-11 LAB — CEA (IN HOUSE-CHCC): CEA (CHCC-In House): <1 ng/mL (ref 0.00–5.00)

## 2016-11-11 NOTE — Progress Notes (Signed)
[facility]  [phone] [phone]  Clinic Follow Up Note   Patient Care Team: Lind Covert, MD as PCP - General Jenny Craver, MD as Consulting Physician (Gastroenterology) [name], MD as Consulting Physician (General Surgery) Truitt Merle, MD as Consulting Physician (Hematology) 11/11/2016  CHIEF COMPLAINTS Follow up colon cancer   DIAGNOSIS:  Sigmoid colon cancer   Staging form: Colon and Rectum, AJCC 7th Edition     Clinical: Stage IIIB (T3, N1a, M0) - Signed by Truitt Merle, MD on 12/28/2014     Cancer of sigmoid colon metastatic to intra-abdominal lymph node ([place])   10/10/2014 Imaging    CT abdomen and pelvis: Eccentric wall thickening along the sigmoid colon, worrisome for early colonic adenocarcinoma. No findings suspicious for metastatic disease. CT chest (-)         11/14/2014 Initial Diagnosis    Colon cancer      11/14/2014 Pathologic Stage    pT3pN1pMx, G2, tumor invading through the muscular propria into pericolonic fatty tissue.  LVI (-), PNI (-). 1/28 node positive, margins negative.       11/14/2014 Surgery    sigmoid colon segmental resection, margins (-).       12/28/2014 - 05/17/2015 Adjuvant Chemotherapy    [id number], 10% dose reduction due to neuropathy and cytopenia from cycle 5, oxaliplatin held from cycle 8 due to leg pain and neuropathy. chemo stopped after cycle 10 due to her tolerance issue.       12/29/2015 Imaging    CT chest, abdomen and pelvis with contrast showed no evidence of recurrence.       HISTORY OF INITIAL PRESENTING ILLNESS:  Jenny [name] 55 y.o. female without significant past medical history, who is referred by her surgeon Dr. Marcello Moores to discuss adjuvant chemotherapy for her resected colon cancer.  She presented with abdominal pain for 8-9 month, which was related to position. She noticed bloody stool in Aug 2015. She was seen by PCP, and was referred to GI Dr. Collene Mares, and underwent  colonoscopy which showed a mass at sigmoid colon (per patient, I do not have the report). Biopsy showed adenocarcinoma. She was then referred to surgeon Dr. Marcello Moores and underwent sigmoid colon segmentectomy on 11/14/2014.   TREATMENT: observation  INTERIM HISTORY: Jenny [name] returns for follow-up. She is doing well overall, her neuropathy and energy level has improved some lately. She plans to go back to work in January. She has had chest pain for the past few weeks, is located in the middle chest, persistent, no radiation, no dyspnea, cough, fever or chills, she did not notice any relationship with food. The pain is tolerable, but she is concerned it's been persistent.   MEDICAL HISTORY:  Past Medical History:  Diagnosis Date  . Anemia   . Cancer [facility])    colon cancer   . Enlarged thyroid     SURGICAL HISTORY: Past Surgical History:  Procedure Laterality Date  . CERVICAL SPINE SURGERY  06/17/2012   C5-C7 ACDF  . CESAREAN SECTION    . PARTIAL HYSTERECTOMY    . PORT-A-CATH REMOVAL Left 07/28/2015   Procedure: REMOVAL PORT-A-CATH;  Surgeon: [name], MD;  Location: [facility];  Service: General;  Laterality: Left;  . PORTACATH PLACEMENT Left 12/22/2014   Procedure: INSERTION PORT-A-CATH LEFT SUBCLAVIAN;  Surgeon: [name], MD;  Location: [facility];  Service: General;  Laterality: Left;  . TONSILLECTOMY      SOCIAL HISTORY: History   Social History  . Marital  Status: Married    Spouse Name: N/A    Number of Children: 2  . Years of Education: N/A   Occupational History  . Not on file.   Social History Main Topics  . Smoking status: Never Smoker   . Smokeless tobacco: Never Used  . Alcohol Use: No  . Drug Use: No  . Sexual Activity: Not on file    P2G2, one child was murdered. She is a Paramedic.   FAMILY HISTORY: Family History  Problem Relation Age of Onset  . Colon cancer Neg Hx   . Liver Cancer Brother        . Hypertension Mother     ALLERGIES:   is allergic to other and shellfish allergy.  MEDICATIONS:  Current Outpatient Prescriptions  Medication Sig Dispense Refill  . acetaminophen (TYLENOL) 500 MG tablet Take 500 mg by mouth every 6 (six) hours as needed.    . diphenhydrAMINE (BENADRYL) 12.5 MG/5ML liquid Take 12.5-25 mg by mouth every 4 (four) hours as needed for itching or allergies.    Marland Kitchen gabapentin (NEURONTIN) 300 MG capsule Take 1 capsule (300 mg total) by mouth 3 (three) times daily. 90 capsule 3  . HYDROcodone-acetaminophen (NORCO/VICODIN) 5-325 MG tablet Take 1 tablet by mouth every 12 (twelve) hours as needed for moderate pain. 30 tablet 0  . hydrocortisone cream 0.5 % Apply 1 application topically 2 (two) times daily as needed for itching.    . loratadine (CLARITIN) 10 MG tablet Take 10 mg by mouth daily. AS NEEDED    . Multiple Vitamin (MULTIVITAMIN) tablet Take 1 tablet by mouth daily.    . naproxen sodium (ANAPROX) 220 MG tablet Take 220 mg by mouth 2 (two) times daily with a meal.    . ondansetron (ZOFRAN) 8 MG tablet Take 1 by mouth twice a day for 2 days after chemotherapy and then twice daily as needed 30 tablet 1  . triamcinolone (KENALOG) 0.025 % ointment Apply 1 application topically 2 (two) times daily. 30 g 1   No current facility-administered medications for this visit.     REVIEW OF SYSTEMS:   Constitutional: Denies fevers, chills or abnormal night sweats Eyes: Denies blurriness of vision, double vision or watery eyes Ears, nose, mouth, throat, and face: Denies mucositis or sore throat Respiratory: Denies cough, dyspnea or wheezes Cardiovascular: Denies palpitation, chest discomfort or lower extremity swelling Gastrointestinal:  Denies nausea, heartburn or change in bowel habits Skin: Denies abnormal skin rashes Lymphatics: Denies new lymphadenopathy or easy bruising Neurological:Denies numbness, tingling or new weaknesses Behavioral/Psych: Mood is stable, no new changes  All other systems were  reviewed with the patient and are negative.  PHYSICAL EXAMINATION: ECOG PERFORMANCE STATUS: 1 BP 139/79 (BP Location: Left Arm, Patient Position: Sitting)   Pulse 64   Temp 98 F (36.7 C) (Oral)   Resp 18   Ht '5\' 5"'$  (1.651 m)   Wt 258 lb 11.2 oz (117.3 kg)   SpO2 99%   BMI 43.05 kg/m   GENERAL:alert, no distress and comfortable, obese  SKIN: skin color, texture, turgor are normal, no rashes or significant lesions EYES: normal, conjunctiva are pink and non-injected, sclera clear OROPHARYNX:no exudate, no erythema and lips, buccal mucosa, and tongue normal  NECK: supple, thyroid normal size, non-tender, without nodularity LYMPH:  no palpable lymphadenopathy in the cervical, axillary or inguinal LUNGS: clear to auscultation and percussion with normal breathing effort HEART: regular rate & rhythm and no murmurs and no lower extremity edema ABDOMEN:abdomen soft,  non-tender and normal bowel sounds. Surgical wound has well-healed Musculoskeletal:no cyanosis of digits and no clubbing  PSYCH: alert & oriented x 3 with fluent speech NEURO: no focal motor/sensory deficits  LABORATORY DATA:  I have reviewed the data as listed CBC Latest Ref Rng & Units 11/11/2016 07/01/2016 04/12/2016  WBC 3.9 - 10.3 10e3/uL 5.8 6.0 6.8  Hemoglobin 11.6 - 15.9 g/dL 12.5 12.8 12.5  Hematocrit 34.8 - 46.6 % 38.8 39.2 38.7  Platelets 145 - 400 10e3/uL 276 271 282    CMP Latest Ref Rng & Units 11/11/2016 07/01/2016 04/12/2016  Glucose 70 - 140 mg/dl 126 116 164(H)  BUN 7.0 - 26.0 mg/dL 11.2 14.8 10.4  Creatinine 0.6 - 1.1 mg/dL 0.8 1.0 0.8  Sodium 136 - 145 mEq/L 141 141 140  Potassium 3.5 - 5.1 mEq/L 4.2 3.9 4.1  Chloride 96 - 112 mEq/L - - -  CO2 22 - 29 mEq/L '24 25 24  '$ Calcium 8.4 - 10.4 mg/dL 9.6 9.7 9.9  Total Protein 6.4 - 8.3 g/dL 8.0 7.9 7.9  Total Bilirubin 0.20 - 1.20 mg/dL 0.34 0.32 0.32  Alkaline Phos 40 - 150 U/L 77 70 71  AST 5 - 34 U/L '17 16 14  '$ ALT 0 - 55 U/L '19 20 14   '$ CEA FROM TODAY  IS STILL PENDING   PATHOLOGY REPORT 11/14/2014 Diagnosis(continued) 1. Colon, segmental resection for tumor, sigmoid - INVASIVE ADENOCARCINOMA, INVADING THROUGH THE MUSCULARIS PROPRIA INTO PERICOLONIC FATTY TISSUE. - ONE OF TWENTY-EIGHT LYMPH NODES, POSITIVE FOR METASTATIC CARCINOMA (1/28). - RESECTION MARGINS, NEGATIVE FOR ATYPIA OR MALIGNANCY - RESECTION MARGINS, NEGATIVE FOR ATYPIA OR MALIGNANCY. 2. Colon, resection margin (donut), colon final distal margin - BENIGN COLONIC MUCOSA, NO EVIDENCE OF MALIGNANCY. Microscopic Comment 1. COLON Specimen: Sigmoid colon Procedure: Segmental resection Tumor site: Sigmoid colon Specimen integrity: Intact Macroscopic intactness of mesorectum: N/A Macroscopic tumor perforation: No Invasive tumor: Maximum size: 2.7 cm, gross measurement Histologic type(s): Invasive adenocarcinoma Histologic grade and differentiation: G2: moderately differentiated/low grade Type of polyp tumor arose from: Tubular adenoma Microscopic extension of invasive tumor: Invading through the muscularis propria into pericolonic fatty tissue. Lymph-Vascular invasion: Not identified Peri-neural invasion: Not identified Tumor deposit(s) (discontinuous extramural extension): N/A Resection margins: Negative Open margin: 7.1 cm Stapled margin: 5.5 cm Mesenteric margin (sigmoid and transverse): 9 cm Treatment effect (neo-adjuvant therapy): No Additional polyp(s): N/A Non-neoplastic findings: N/A Lymph nodes: number examined 28; number positive: 1 Pathologic Staging: pT3, pN1a, pMX Ancillary studies: MMR stains will be performed and an addendum report will follow. MSI testing will be performed at an outside institution and the results will be available in EPIC. (HCL:kh 11-15-14)  TUMOR MSI: stable (by PCR),  MMR: NORMAL   RADIOGRAPHIC STUDIES: I have personally reviewed the radiological images as listed and agreed with the findings in the report.  CT chest, abdomen  and pelvis 07/01/2016 IMPRESSION: 1. Stable exam. No new or progressive findings to suggest recurrent/metastatic disease. 2. Hepatic steatosis. 3. Stable asymmetric enlargement right thyroid lobe, consistent with underlying nodule/mass.   ASSESSMENT & PLAN:  55 year old African-American female, without significant past medical history, presents with abdominal pain and bloody bowel movement, anemia, was found to have a sigmoid adenocarcinoma, status post complete surgical resection.   1. Sigmoid colon adenocarcinoma, G2, pT3pN1aM0, stage IIIB, MSI stable  -the nature history of colon cancer, and high risk of cancer recurrence. IIIB disease with previously discussed and reviewed with her -She is clinically doing well, still has some residual neuropathy and body aches  from chemotherapy.  -Her physical exam today was normal. I reviewed her lab results, including CEA, which are all normal. -I discussed her repeat his CT scan from 07/01/2016, which showed NED -we'll continue surveillance. I'll see her every 3-4 months for the first 2 years, then every 6 months afterwards for total 5 years. We will check her lab and CEA was each visit, CT scan every 6-12 months. -her colonoscopy in 10/2015 was unremarkable ,done by Dr. Collene Mares  - her lab test today showed normal CBC and CMP,  Her CEA is still pending  - She is close to 2 years out of her diagnosis now, we discussed the risk of recurrence decreases after the first 2-3 years. - She'll return in 4 months with lab.  I'll repeat her CT scan in 06/2017  2.  Peripheral neuropathy, b/l leg and body pain, secondary to chemo -overall has improved. She takes Neurontin, Tylenol or ibuprofen as needed,  And Vicodin once to twice  A week -She has completed physical therapy and will continue exercise   3. Obesity and  Hepatic steatosis - I encouraged her to lose some weight, by diet control and exercise -she has mild fatty liver on CT scan,  I encouraged her to  follow-up with her primary care physician and check her lipid profile at least once a year. I encouraged her to lose weight.  4. Chest pain -not sure about the etiology, atypical for cardiac chest pain  -I recommend her to try over-the-counter antiacid medication, avoid NSAIDs, to see if her pain improves -I recommend her to follow-up with her primary care physician if her chest pain does not resolve.     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 The patient knows to call the clinic with any problems,  questions or concerns.  I spent 20 minutes counseling the patient face to face. The total time spent in the appointment was 25 minutes and more than 50% was on counseling.     Truitt Merle, MD 11/11/2016 10:49 AM

## 2016-11-11 NOTE — Telephone Encounter (Signed)
Appointments scheduled per 11/20 LOS. Patient given AVS report and calendars with future scheduled appointments. Patient aware of CT scan. Will come back later for prep/ contrast

## 2016-11-12 LAB — CEA: CEA: 2.1 ng/mL (ref 0.0–4.7)

## 2016-11-20 ENCOUNTER — Encounter: Payer: Self-pay | Admitting: Family Medicine

## 2016-11-20 NOTE — Progress Notes (Signed)
Subjective  Patient is presenting with the following illnesses     Chief Complaint noted Review of Symptoms - see HPI PMH - Smoking status noted.     Objective Vital Signs reviewed     Assessments/Plans  No problem-specific Assessment & Plan notes found for this encounter.   See Encounter view if individual problem A/Ps not visible See after visit summary for details of patient instuctions 

## 2016-12-02 ENCOUNTER — Encounter: Payer: Self-pay | Admitting: Hematology

## 2016-12-12 ENCOUNTER — Telehealth: Payer: Self-pay | Admitting: Family Medicine

## 2016-12-12 NOTE — Telephone Encounter (Signed)
Spoke with Ms Lipko on her husband's cell phone when he was here for a visit a few weeks ago.  Recommended she come in for a visit for her diabetes and thyroid problems She agreed

## 2016-12-25 ENCOUNTER — Encounter: Payer: Self-pay | Admitting: Family Medicine

## 2016-12-25 ENCOUNTER — Ambulatory Visit (INDEPENDENT_AMBULATORY_CARE_PROVIDER_SITE_OTHER): Payer: BC Managed Care – PPO | Admitting: Family Medicine

## 2016-12-25 VITALS — BP 138/82 | HR 82 | Temp 98.2°F | Ht 65.5 in | Wt 261.0 lb

## 2016-12-25 DIAGNOSIS — Z23 Encounter for immunization: Secondary | ICD-10-CM | POA: Diagnosis not present

## 2016-12-25 DIAGNOSIS — M545 Low back pain, unspecified: Secondary | ICD-10-CM

## 2016-12-25 DIAGNOSIS — G8929 Other chronic pain: Secondary | ICD-10-CM

## 2016-12-25 DIAGNOSIS — E010 Iodine-deficiency related diffuse (endemic) goiter: Secondary | ICD-10-CM

## 2016-12-25 DIAGNOSIS — E119 Type 2 diabetes mellitus without complications: Secondary | ICD-10-CM | POA: Diagnosis not present

## 2016-12-25 DIAGNOSIS — M549 Dorsalgia, unspecified: Secondary | ICD-10-CM | POA: Insufficient documentation

## 2016-12-25 LAB — POCT GLYCOSYLATED HEMOGLOBIN (HGB A1C): Hemoglobin A1C: 7.1

## 2016-12-25 MED ORDER — NAPROXEN SODIUM 220 MG PO TABS: 220.0000 mg | ORAL_TABLET | Freq: Two times a day (BID) | ORAL | 1 refills | Status: DC | PRN

## 2016-12-25 NOTE — Patient Instructions (Addendum)
Good to see you today!  Thanks for coming in.  Keep working on the weight that is good way to keep from having to take medications for diabetes.  Your goal is to lose 1-2 lbs a week.   The key is to eat less especially high calorie foods   For the back pain - Use the arthritis otc medication when not to bad.   If hurting a lot then try the naprosyn   Check your feet every day look for sores and put vaseline on them every night  You should get the thyroid biopsy to make sure you don't have cancer   You should see an eye doctor to check for any damage to your eyes from diabetes   I will call you if your tests are not good.  Otherwise I will send you a letter.  If you do not hear from me with in 2 weeks please call our office.     Come back in 3 months

## 2016-12-25 NOTE — Assessment & Plan Note (Signed)
Seems most consistent with DJD or back strain.  Will treat with as needed simple analgesics.   If not improving will need imaging given history of cancer

## 2016-12-25 NOTE — Assessment & Plan Note (Signed)
At goal. Discussed weight control

## 2016-12-25 NOTE — Progress Notes (Signed)
Subjective  Patient is presenting with the following illnesses after not being seen for more than a year  DIABETES Disease Monitoring: Blood Sugar ranges(Severity) -not checking  Associated Symptoms- Polyuria/phagia/dipsia- no      Visual problems- no Medications: Compliance(Modifying factor) - diet controlled Hypoglycemic symptoms- no Timing - continuous  Back Pain Arthritis Intermittent pain in lower back. Told has arthritis.  No injury.  No weakness or incontinence.  Tylenol helps some.  Not taking anything else.  Thyroid Nodules Was set up for a biopsy but never went.  Does feel it is getting slightly larger.  Minimal problems swallowing.  No weight loss or palpitations   Chief Complaint noted Review of Symptoms - see HPI PMH - Smoking status noted.     Objective Vital Signs reviewed Heart - Regular rate and rhythm.  No murmurs, gallops or rubs.    Lungs:  Normal respiratory effort, chest expands symmetrically. Lungs are clear to auscultation, no crackles or wheezes.   Back - Normal skin, Spine with normal alignment and no deformity.  No tenderness to vertebral process palpation.  Paraspinous muscles are mildly tender and without spasm.   Range of motion is full at neck and lumbar sacral regions Diabetic Foot Check -  Appearance - no lesions, ulcers diffusely dry with thickened skin Skin - no unusual pallor or redness Monofilament testing -  Right - Great toe, medial, central, lateral ball and posterior foot decreased Left - Great toe, medial, central, lateral ball and posterior foot decreased     Assessments/Plans  No problem-specific Assessment & Plan notes found for this encounter.   See Encounter view if individual problem A/Ps not visible See after visit summary for details of patient instuctions

## 2016-12-25 NOTE — Assessment & Plan Note (Signed)
Encouraged her to follow up with the biopsy as previously scheduled

## 2016-12-26 ENCOUNTER — Encounter: Payer: Self-pay | Admitting: Family Medicine

## 2016-12-26 LAB — LIPID PANEL
Cholesterol: 150 mg/dL (ref <200)
HDL: 41 mg/dL — ABNORMAL LOW (ref >50)
LDL Cholesterol: 74 mg/dL (ref <100)
Total CHOL/HDL Ratio: 3.7 Ratio (ref <5.0)
Triglycerides: 174 mg/dL — ABNORMAL HIGH (ref <150)
VLDL: 35 mg/dL — ABNORMAL HIGH (ref <30)

## 2016-12-26 LAB — MICROALBUMIN, URINE: Microalb, Ur: 0.7 mg/dL

## 2016-12-26 NOTE — Progress Notes (Signed)
Subjective  Patient is presenting with the following illnesses     Chief Complaint noted Review of Symptoms - see HPI PMH - Smoking status noted.     Objective Vital Signs reviewed     Assessments/Plans  No problem-specific Assessment & Plan notes found for this encounter.   See Encounter view if individual problem A/Ps not visible See after visit summary for details of patient instuctions 

## 2017-01-10 ENCOUNTER — Other Ambulatory Visit: Payer: Self-pay | Admitting: Nurse Practitioner

## 2017-02-01 ENCOUNTER — Encounter (HOSPITAL_COMMUNITY): Payer: Self-pay | Admitting: *Deleted

## 2017-02-01 ENCOUNTER — Ambulatory Visit (HOSPITAL_COMMUNITY)
Admission: EM | Admit: 2017-02-01 | Discharge: 2017-02-01 | Disposition: A | Discharge status: Home / Self Care | Payer: BC Managed Care – PPO | Attending: Radiology | Admitting: Radiology

## 2017-02-01 DIAGNOSIS — J069 Acute upper respiratory infection, unspecified: Secondary | ICD-10-CM

## 2017-02-01 MED ORDER — AMOXICILLIN-POT CLAVULANATE 875-125 MG PO TABS: 1.0000 | ORAL_TABLET | Freq: Two times a day (BID) | ORAL | 0 refills | Status: DC

## 2017-02-01 MED ORDER — FLUTICASONE PROPIONATE 50 MCG/ACT NA SUSP
2.0000 | Freq: Every day | NASAL | 2 refills | Status: AC
Start: 2017-02-01 — End: ?

## 2017-02-01 MED ORDER — IPRATROPIUM-ALBUTEROL 0.5-2.5 (3) MG/3ML IN SOLN
3.0000 mL | Freq: Once | RESPIRATORY_TRACT | Status: AC
  Administered 2017-02-01: 3 mL via RESPIRATORY_TRACT

## 2017-02-01 MED ORDER — BENZONATATE 100 MG PO CAPS: 100.0000 mg | ORAL_CAPSULE | Freq: Three times a day (TID) | ORAL | 0 refills | Status: DC

## 2017-02-01 MED ORDER — IPRATROPIUM-ALBUTEROL 0.5-2.5 (3) MG/3ML IN SOLN
RESPIRATORY_TRACT | Status: AC
  Filled 2017-02-01: qty 3

## 2017-02-01 MED ORDER — DEXAMETHASONE SODIUM PHOSPHATE 10 MG/ML IJ SOLN
INTRAMUSCULAR | Status: AC
  Filled 2017-02-01: qty 1

## 2017-02-01 MED ORDER — DEXAMETHASONE SODIUM PHOSPHATE 10 MG/ML IJ SOLN
10.0000 mg | Freq: Once | INTRAMUSCULAR | Status: AC
  Administered 2017-02-01: 10 mg via INTRAMUSCULAR

## 2017-02-01 MED ORDER — SODIUM CHLORIDE 0.9 % IN NEBU
INHALATION_SOLUTION | RESPIRATORY_TRACT | Status: AC
Start: 2017-02-01 — End: 2017-02-01
  Filled 2017-02-01: qty 3

## 2017-02-01 NOTE — ED Provider Notes (Signed)
CSN: [id number]     Arrival date & time 02/01/17  1521 History   First MD Initiated Contact with Patient 02/01/17 1717     Chief Complaint  Patient presents with  . Cough  . Fever   (Consider location/radiation/quality/duration/timing/severity/associated sxs/prior Treatment) 56 y.o. female presents with sore throat, nasal congestion, headache, generalized body aches, cough and incontinence (occuring when she coughs X 3 weeks. Patient states that she has been taking an array of over the counter medication and had felt better but not 100% better and then the symptoms worsened on tuesday . Condition is acute in nature. Condition is made better by nothing. Condition is made worse by nothing. Patient denies any relief from over the counter medication taken  prior to there arrival at this facility. 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        Past Medical History:  Diagnosis Date  . Anemia   . Cancer [facility])    colon cancer   . Enlarged thyroid    Past Surgical History:  Procedure Laterality Date  . BOWEL RESECTION    . CERVICAL SPINE SURGERY  06/17/2012   C5-C7 ACDF  . CESAREAN SECTION    . PARTIAL HYSTERECTOMY    . PORT-A-CATH REMOVAL Left 07/28/2015   Procedure: REMOVAL PORT-A-CATH;  Surgeon: [name], MD;  Location: [facility];  Service: General;  Laterality: Left;  . PORTACATH PLACEMENT Left 12/22/2014   Procedure: INSERTION PORT-A-CATH LEFT SUBCLAVIAN;  Surgeon: [name], MD;  Location: [facility];  Service: General;  Laterality: Left;  . TONSILLECTOMY     Family History  Problem Relation Age of Onset  . Cancer Brother   . Cancer Brother   . Hypertension Mother   . Colon cancer Neg Hx    Social History  Substance Use Topics  . Smoking status: Never Smoker  . Smokeless tobacco: Never Used  . Alcohol use No   OB History    No data available     Review of Systems  Allergies  Other and Shellfish allergy  Home  Medications   Prior to Admission medications   Medication Sig Start Date End Date Taking? Authorizing Provider  acetaminophen (TYLENOL) 500 MG tablet Take 500 mg by mouth every 6 (six) hours as needed.   Yes Historical Provider, MD  gabapentin (NEURONTIN) 300 MG capsule Take 1 capsule (300 mg total) by mouth 3 (three) times daily. 04/12/16  Yes Truitt Merle, MD  loratadine (CLARITIN) 10 MG tablet Take 10 mg by mouth daily. AS NEEDED   Yes Historical Provider, MD  Multiple Vitamin (MULTIVITAMIN) tablet Take 1 tablet by mouth daily.   Yes Historical Provider, MD  naproxen sodium (ANAPROX) 220 MG tablet Take 1 tablet (220 mg total) by mouth 2 (two) times daily as needed. 12/25/16  Yes Lind Covert, MD  amoxicillin-clavulanate (AUGMENTIN) 875-125 MG tablet Take 1 tablet by mouth every 12 (twelve) hours. 02/01/17   Jacqualine Mau, NP  benzonatate (TESSALON) 100 MG capsule Take 1 capsule (100 mg total) by mouth every 8 (eight) hours. 02/01/17   Jacqualine Mau, NP  diphenhydrAMINE (BENADRYL) 12.5 MG/5ML liquid Take 12.5-25 mg by mouth every 4 (four) hours as needed for itching or allergies.    Historical Provider, MD  fluticasone (FLONASE) 50 MCG/ACT nasal spray Place 2 sprays into both nostrils daily. 02/01/17   Jacqualine Mau, NP  HYDROcodone-acetaminophen (NORCO/VICODIN) 5-325 MG tablet Take 1 tablet by mouth every  12 (twelve) hours as needed for moderate pain. Patient not taking: Reported on 12/25/2016 04/12/16   Truitt Merle, MD  hydrocortisone cream 0.5 % Apply 1 application topically 2 (two) times daily as needed for itching.    Historical Provider, MD  ondansetron (ZOFRAN) 8 MG tablet Take 1 by mouth twice a day for 2 days after chemotherapy and then twice daily as needed 12/28/14   Truitt Merle, MD  triamcinolone (KENALOG) 0.025 % ointment Apply 1 application topically 2 (two) times daily. 12/14/14   Lind Covert, MD   Meds Ordered and Administered this Visit   Medications   dexamethasone (DECADRON) injection 10 mg (10 mg Intramuscular Given 02/01/17 1745)  ipratropium-albuterol (DUONEB) 0.5-2.5 (3) MG/3ML nebulizer solution 3 mL (3 mLs Nebulization Given 02/01/17 1747)    BP 128/74   Pulse 107   Temp 101.3 F (38.5 C) (Oral)   Resp 16   SpO2 98%  No data found.   Physical Exam  Urgent Care Course     Procedures (including critical care time)  Labs Review Labs Reviewed - No data to display  Imaging Review No results found.  Patient's breathing improved with nebulizer treatment   MDM   1. Upper respiratory tract infection, unspecified type        Jacqualine Mau, NP 02/01/17 1754

## 2017-02-01 NOTE — ED Triage Notes (Signed)
C/O cough, cold sxs x 4 wks.  Started to get worse approx 2 wks ago; and progressed over past few days.  C/O runny nose, HA, body aches, sore throat.  Has been taking Theraflu, Delsym, Mucinex.

## 2017-02-10 ENCOUNTER — Other Ambulatory Visit (HOSPITAL_BASED_OUTPATIENT_CLINIC_OR_DEPARTMENT_OTHER): Payer: BC Managed Care – PPO

## 2017-02-10 DIAGNOSIS — C187 Malignant neoplasm of sigmoid colon: Secondary | ICD-10-CM

## 2017-02-10 DIAGNOSIS — C772 Secondary and unspecified malignant neoplasm of intra-abdominal lymph nodes: Secondary | ICD-10-CM

## 2017-02-10 LAB — CBC WITH DIFFERENTIAL/PLATELET
BASO%: 0.3 % (ref 0.0–2.0)
Basophils Absolute: 0 10*3/uL (ref 0.0–0.1)
EOS%: 1.8 % (ref 0.0–7.0)
Eosinophils Absolute: 0.1 10*3/uL (ref 0.0–0.5)
HCT: 37.6 % (ref 34.8–46.6)
HGB: 12.5 g/dL (ref 11.6–15.9)
LYMPH%: 53.7 % — ABNORMAL HIGH (ref 14.0–49.7)
MCH: 27.1 pg (ref 25.1–34.0)
MCHC: 33.2 g/dL (ref 31.5–36.0)
MCV: 81.6 fL (ref 79.5–101.0)
MONO#: 0.2 10*3/uL (ref 0.1–0.9)
MONO%: 3.1 % (ref 0.0–14.0)
NEUT#: 2.5 10*3/uL (ref 1.5–6.5)
NEUT%: 41.1 % (ref 38.4–76.8)
Platelets: 281 10*3/uL (ref 145–400)
RBC: 4.61 10*6/uL (ref 3.70–5.45)
RDW: 15.7 % — ABNORMAL HIGH (ref 11.2–14.5)
WBC: 6.2 10*3/uL (ref 3.9–10.3)
lymph#: 3.3 10*3/uL (ref 0.9–3.3)

## 2017-02-10 LAB — COMPREHENSIVE METABOLIC PANEL
ALT: 20 U/L (ref 0–55)
AST: 16 U/L (ref 5–34)
Albumin: 4.2 g/dL (ref 3.5–5.0)
Alkaline Phosphatase: 64 U/L (ref 40–150)
Anion Gap: 11 mEq/L (ref 3–11)
BUN: 14.1 mg/dL (ref 7.0–26.0)
CO2: 23 mEq/L (ref 22–29)
Calcium: 10.2 mg/dL (ref 8.4–10.4)
Chloride: 107 mEq/L (ref 98–109)
Creatinine: 0.8 mg/dL (ref 0.6–1.1)
EGFR: >90 mL/min/{1.73_m2} (ref >90)
Glucose: 115 mg/dl (ref 70–140)
Potassium: 3.9 mEq/L (ref 3.5–5.1)
Sodium: 141 mEq/L (ref 136–145)
Total Bilirubin: 0.42 mg/dL (ref 0.20–1.20)
Total Protein: 7.9 g/dL (ref 6.4–8.3)

## 2017-02-11 LAB — CEA (IN HOUSE-CHCC): CEA (CHCC-In House): <1 ng/mL (ref 0.00–5.00)

## 2017-02-11 LAB — CEA: CEA: 2.3 ng/mL (ref 0.0–4.7)

## 2017-02-12 NOTE — Progress Notes (Signed)
[place]  [phone] [phone]  Clinic Follow Up Note   Patient Care Team: Lind Covert, MD as PCP - General Juanita Craver, MD as Consulting Physician (Gastroenterology) [name], MD as Consulting Physician (General Surgery) Truitt Merle, MD as Consulting Physician (Hematology) 02/13/2017  CHIEF COMPLAINTS Follow up colon cancer   DIAGNOSIS:  Sigmoid colon cancer   Staging form: Colon and Rectum, AJCC 7th Edition     Clinical: Stage IIIB (T3, N1a, M0) - Signed by Truitt Merle, MD on 12/28/2014     Cancer of sigmoid colon metastatic to intra-abdominal lymph node ([place])   10/10/2014 Imaging    CT abdomen and pelvis: Eccentric wall thickening along the sigmoid colon, worrisome for early colonic adenocarcinoma. No findings suspicious for metastatic disease. CT chest (-)         11/14/2014 Initial Diagnosis    Colon cancer      11/14/2014 Pathologic Stage    pT3pN1pMx, G2, tumor invading through the muscular propria into pericolonic fatty tissue.  LVI (-), PNI (-). 1/28 node positive, margins negative.       11/14/2014 Surgery    sigmoid colon segmental resection, margins (-).       12/28/2014 - 05/17/2015 Adjuvant Chemotherapy    [id number], 10% dose reduction due to neuropathy and cytopenia from cycle 5, oxaliplatin held from cycle 8 due to leg pain and neuropathy. chemo stopped after cycle 10 due to her tolerance issue.       12/29/2015 Imaging    CT chest, abdomen and pelvis with contrast showed no evidence of recurrence.       HISTORY OF INITIAL PRESENTING ILLNESS:  Jenny [name] 56 y.o. female without significant past medical history, who is referred by her surgeon Dr. Marcello Moores to discuss adjuvant chemotherapy for her resected colon cancer.  She presented with abdominal pain for 8-9 month, which was related to position. She noticed bloody stool in Aug 2015. She was seen by PCP, and was referred to GI Dr. Collene Mares, and underwent  colonoscopy which showed a mass at sigmoid colon (per patient, I do not have the report). Biopsy showed adenocarcinoma. She was then referred to surgeon Dr. Marcello Moores and underwent sigmoid colon segmentectomy on 11/14/2014.   TREATMENT: observation  INTERIM HISTORY: Jenny [name] returns for follow-up. The neuropathy in her hands and feet hasn't gotten any better. She says it is very painful. It hurts when she is working (started January 22) and showering, so she has a hard time showering more than twice a week. Writing is still difficult, but it is getting better. She takes Gabapentin once at night. Pt also takes walks during work and when she gets home. She is very tired and can't do much at home if she works. Work is draining her energy. She has been newly diagnosed with diabetes, but she is not on medication yet. Her PCP is monitoring her weight and food. She has some abdominal pain when she bends over. She explains it as cramping and knots. It lasts for a few minutes before it goes away. She takes MiraLax to help this. Denies loss of appetite, or any other concerns.   MEDICAL HISTORY:  Past Medical History:  Diagnosis Date  . Anemia   . Cancer [facility])    colon cancer   . Enlarged thyroid     SURGICAL HISTORY: Past Surgical History:  Procedure Laterality Date  . BOWEL RESECTION    . CERVICAL SPINE SURGERY  06/17/2012   C5-C7  ACDF  . CESAREAN SECTION    . PARTIAL HYSTERECTOMY    . PORT-A-CATH REMOVAL Left 07/28/2015   Procedure: REMOVAL PORT-A-CATH;  Surgeon: [name], MD;  Location: [facility];  Service: General;  Laterality: Left;  . PORTACATH PLACEMENT Left 12/22/2014   Procedure: INSERTION PORT-A-CATH LEFT SUBCLAVIAN;  Surgeon: [name], MD;  Location: [facility];  Service: General;  Laterality: Left;  . TONSILLECTOMY      SOCIAL HISTORY: History   Social History  . Marital Status: Married    Spouse Name: N/A    Number of Children: 2  . Years of Education: N/A   Occupational  History  . Not on file.   Social History Main Topics  . Smoking status: Never Smoker   . Smokeless tobacco: Never Used  . Alcohol Use: No  . Drug Use: No  . Sexual Activity: Not on file    P2G2, one child was murdered. She is a Paramedic.   FAMILY HISTORY: Family History  Problem Relation Age of Onset  . Colon cancer Neg Hx   . Liver Cancer Brother        . Hypertension Mother     ALLERGIES:  is allergic to other and shellfish allergy.  MEDICATIONS:  Current Outpatient Prescriptions  Medication Sig Dispense Refill  . acetaminophen (TYLENOL) 500 MG tablet Take 500 mg by mouth every 6 (six) hours as needed.    Marland Kitchen amoxicillin-clavulanate (AUGMENTIN) 875-125 MG tablet Take 1 tablet by mouth every 12 (twelve) hours. 14 tablet 0  . benzonatate (TESSALON) 100 MG capsule Take 1 capsule (100 mg total) by mouth every 8 (eight) hours. 21 capsule 0  . diphenhydrAMINE (BENADRYL) 12.5 MG/5ML liquid Take 12.5-25 mg by mouth every 4 (four) hours as needed for itching or allergies.    . fluticasone (FLONASE) 50 MCG/ACT nasal spray Place 2 sprays into both nostrils daily. 16 g 2  . gabapentin (NEURONTIN) 300 MG capsule Take 1 capsule (300 mg total) by mouth at bedtime. 30 capsule 5  . hydrocortisone cream 0.5 % Apply 1 application topically 2 (two) times daily as needed for itching.    . loratadine (CLARITIN) 10 MG tablet Take 10 mg by mouth daily. AS NEEDED    . Multiple Vitamin (MULTIVITAMIN) tablet Take 1 tablet by mouth daily.    . naproxen sodium (ANAPROX) 220 MG tablet Take 1 tablet (220 mg total) by mouth 2 (two) times daily as needed. 60 tablet 1  . triamcinolone (KENALOG) 0.025 % ointment Apply 1 application topically 2 (two) times daily. 30 g 1  . HYDROcodone-acetaminophen (NORCO/VICODIN) 5-325 MG tablet Take 1 tablet by mouth every 12 (twelve) hours as needed for moderate pain. (Patient not taking: Reported on 12/25/2016) 30 tablet 0  . ondansetron (ZOFRAN) 8 MG tablet Take 1  by mouth twice a day for 2 days after chemotherapy and then twice daily as needed (Patient not taking: Reported on 02/13/2017) 30 tablet 1   No current facility-administered medications for this visit.     REVIEW OF SYSTEMS:   Constitutional: Denies fevers, chills or abnormal night sweats (+) fatigue Eyes: Denies blurriness of vision, double vision or watery eyes Ears, nose, mouth, throat, and face: Denies mucositis or sore throat Respiratory: Denies cough, dyspnea or wheezes Cardiovascular: Denies palpitation, chest discomfort or lower extremity swelling Gastrointestinal:  Denies nausea, heartburn or change in bowel habits (+) abdominal pain Skin: Denies abnormal skin rashes Lymphatics: Denies new lymphadenopathy or easy bruising Neurological:Denies numbness, tingling or new weaknesses (+)  neuropathy in hands and feet Behavioral/Psych: Mood is stable, no new changes  All other systems were reviewed with the patient and are negative.  PHYSICAL EXAMINATION: ECOG PERFORMANCE STATUS: 1  BP 132/88 (BP Location: Left Wrist)   Pulse 72   Temp 98.1 F (36.7 C)   Resp 19   Ht 5' 5.5" (1.664 m)   Wt 249 lb 8 oz (113.2 kg)   SpO2 98%   BMI 40.89 kg/m    GENERAL:alert, no distress and comfortable, obese  SKIN: skin color, texture, turgor are normal, no rashes or significant lesions EYES: normal, conjunctiva are pink and non-injected, sclera clear OROPHARYNX:no exudate, no erythema and lips, buccal mucosa, and tongue normal  NECK: supple, thyroid normal size, non-tender, without nodularity LYMPH:  no palpable lymphadenopathy in the cervical, axillary or inguinal LUNGS: clear to auscultation and percussion with normal breathing effort HEART: regular rate & rhythm and no murmurs and no lower extremity edema ABDOMEN:abdomen soft, non-tender and normal bowel sounds. Surgical wound has well-healed  Musculoskeletal:no cyanosis of digits and no clubbing  PSYCH: alert & oriented x 3 with fluent  speech NEURO: no focal motor/sensory deficits  LABORATORY DATA:  I have reviewed the data as listed CBC Latest Ref Rng & Units 02/10/2017 11/11/2016 07/01/2016  WBC 3.9 - 10.3 10e3/uL 6.2 5.8 6.0  Hemoglobin 11.6 - 15.9 g/dL 12.5 12.5 12.8  Hematocrit 34.8 - 46.6 % 37.6 38.8 39.2  Platelets 145 - 400 10e3/uL 281 276 271    CMP Latest Ref Rng & Units 02/10/2017 11/11/2016 07/01/2016  Glucose 70 - 140 mg/dl 115 126 116  BUN 7.0 - 26.0 mg/dL 14.1 11.2 14.8  Creatinine 0.6 - 1.1 mg/dL 0.8 0.8 1.0  Sodium 136 - 145 mEq/L 141 141 141  Potassium 3.5 - 5.1 mEq/L 3.9 4.2 3.9  Chloride 96 - 112 mEq/L - - -  CO2 22 - 29 mEq/L '23 24 25  '$ Calcium 8.4 - 10.4 mg/dL 10.2 9.6 9.7  Total Protein 6.4 - 8.3 g/dL 7.9 8.0 7.9  Total Bilirubin 0.20 - 1.20 mg/dL 0.42 0.34 0.32  Alkaline Phos 40 - 150 U/L 64 77 70  AST 5 - 34 U/L '16 17 16  '$ ALT 0 - 55 U/L '20 19 20   '$ CEA normal (2.3ng/ml) on 02/10/2017  PATHOLOGY REPORT 11/14/2014 Diagnosis(continued) 1. Colon, segmental resection for tumor, sigmoid - INVASIVE ADENOCARCINOMA, INVADING THROUGH THE MUSCULARIS PROPRIA INTO PERICOLONIC FATTY TISSUE. - ONE OF TWENTY-EIGHT LYMPH NODES, POSITIVE FOR METASTATIC CARCINOMA (1/28). - RESECTION MARGINS, NEGATIVE FOR ATYPIA OR MALIGNANCY - RESECTION MARGINS, NEGATIVE FOR ATYPIA OR MALIGNANCY. 2. Colon, resection margin (donut), colon final distal margin - BENIGN COLONIC MUCOSA, NO EVIDENCE OF MALIGNANCY. Microscopic Comment 1. COLON Specimen: Sigmoid colon Procedure: Segmental resection Tumor site: Sigmoid colon Specimen integrity: Intact Macroscopic intactness of mesorectum: N/A Macroscopic tumor perforation: No Invasive tumor: Maximum size: 2.7 cm, gross measurement Histologic type(s): Invasive adenocarcinoma Histologic grade and differentiation: G2: moderately differentiated/low grade Type of polyp tumor arose from: Tubular adenoma Microscopic extension of invasive tumor: Invading through the muscularis  propria into pericolonic fatty tissue. Lymph-Vascular invasion: Not identified Peri-neural invasion: Not identified Tumor deposit(s) (discontinuous extramural extension): N/A Resection margins: Negative Open margin: 7.1 cm Stapled margin: 5.5 cm Mesenteric margin (sigmoid and transverse): 9 cm Treatment effect (neo-adjuvant therapy): No Additional polyp(s): N/A Non-neoplastic findings: N/A Lymph nodes: number examined 28; number positive: 1 Pathologic Staging: pT3, pN1a, pMX Ancillary studies: MMR stains will be performed and an addendum report will follow. MSI  testing will be performed at an outside institution and the results will be available in EPIC. (HCL:kh 11-15-14)  TUMOR MSI: stable (by PCR),  MMR: NORMAL   RADIOGRAPHIC STUDIES: I have personally reviewed the radiological images as listed and agreed with the findings in the report.  US Breast LTD Unilateral Left Axilla 10/09/2016 IMPRESSION: Stable adjacent likely benign masses involving the lower outer periareolar left breast, likely fibroadenomas.  CT chest, abdomen and pelvis 07/01/2016 IMPRESSION: 1. Stable exam. No new or progressive findings to suggest recurrent/metastatic disease. 2. Hepatic steatosis. 3. Stable asymmetric enlargement right thyroid lobe, consistent with underlying nodule/mass.  ASSESSMENT & PLAN:  56 y.o. African-American female, without significant past medical history, presents with abdominal pain and bloody bowel movement, anemia, was found to have a sigmoid adenocarcinoma, status post complete surgical resection.   1. Sigmoid colon adenocarcinoma, G2, pT3pN1aM0, stage IIIB, MSI stable  -the nature history of colon cancer, and high risk of cancer recurrence. IIIB disease with previously discussed and reviewed with her -She is clinically doing well, still has some residual neuropathy and body aches from chemotherapy.  -Her physical exam today was normal. I reviewed her lab results, including  CEA, which are all normal. -I previously discussed her repeat his CT scan from 07/01/2016, which showed NED -we'll continue surveillance. I'll see her every 3-4 months for the first 2 years, then every 6 months afterwards for total 5 years. We will check her lab and CEA was each visit, CT scan every 6-12 months. -her colonoscopy in 10/2015 was unremarkable ,done by Dr. Collene Mares  - her lab test today showed normal CBC, CMP and CEA  - She is close to 2 years out of her diagnosis now, we discussed the risk of recurrence decreases after the first 2-3 years. -I'll repeat her CT scan in 06/2017  2.  Peripheral neuropathy, b/l leg and body pain, secondary to chemo -overall has improved. She takes Neurontin, Tylenol or ibuprofen as needed,  And Vicodin once to twice  A week -She has completed physical therapy and will continue exercise  -She has been walking, no other exercise, I encourage her to exercise  -She takes Gabapentin once every night, will continue  3. Obesity and  Hepatic steatosis - I encouraged her to lose some weight, by diet control and exercise -she has mild fatty liver on CT scan,  I encouraged her to follow-up with her primary care physician and check her lipid profile at least once a year. I encouraged her to lose weight.  4. Diabetes -Not on medication -Newly diagnosed. -Monitored by PCP  6. Abdominal and leg Cramping -I have suggested staying hydrated and some calcium supplements.       Plan  -lab results reviewed  -Refill Gabapentin  -I will see her back in 4 months with a surveillance CT chest, abdomen and pelvis with contrast.  All questions were answered. The patient knows to call the clinic with any problems,  questions or concerns.  I spent 20 minutes counseling the patient face to face. The total time spent in the appointment was 25 minutes and more than 50% was on counseling.  This document serves as a record of services personally performed by Truitt Merle, MD. It was  created on her behalf by Martinique Casey, a trained medical scribe. The creation of this record is based on the scribe's personal observations and the provider's statements to them. This document has been checked and approved by the attending provider.  I have reviewed  the above documentation for accuracy and completeness and I agree with the above.   Truitt Merle, MD 02/13/2017

## 2017-02-13 ENCOUNTER — Ambulatory Visit (HOSPITAL_BASED_OUTPATIENT_CLINIC_OR_DEPARTMENT_OTHER): Payer: BC Managed Care – PPO | Admitting: Hematology

## 2017-02-13 VITALS — BP 132/88 | HR 72 | Temp 98.1°F | Resp 19 | Ht 65.5 in | Wt 249.5 lb

## 2017-02-13 DIAGNOSIS — G893 Neoplasm related pain (acute) (chronic): Secondary | ICD-10-CM

## 2017-02-13 DIAGNOSIS — C187 Malignant neoplasm of sigmoid colon: Secondary | ICD-10-CM | POA: Diagnosis not present

## 2017-02-13 DIAGNOSIS — T451X5A Adverse effect of antineoplastic and immunosuppressive drugs, initial encounter: Secondary | ICD-10-CM

## 2017-02-13 DIAGNOSIS — E6609 Other obesity due to excess calories: Secondary | ICD-10-CM

## 2017-02-13 DIAGNOSIS — G620 Drug-induced polyneuropathy: Secondary | ICD-10-CM

## 2017-02-13 DIAGNOSIS — C772 Secondary and unspecified malignant neoplasm of intra-abdominal lymph nodes: Secondary | ICD-10-CM | POA: Diagnosis not present

## 2017-02-13 MED ORDER — GABAPENTIN 300 MG PO CAPS: 300.0000 mg | ORAL_CAPSULE | Freq: Every day | ORAL | 5 refills | Status: DC

## 2017-02-14 ENCOUNTER — Encounter: Payer: Self-pay | Admitting: Hematology

## 2017-03-26 ENCOUNTER — Encounter: Payer: Self-pay | Admitting: Obstetrics and Gynecology

## 2017-03-26 ENCOUNTER — Ambulatory Visit (INDEPENDENT_AMBULATORY_CARE_PROVIDER_SITE_OTHER): Payer: BC Managed Care – PPO | Admitting: Obstetrics and Gynecology

## 2017-03-26 VITALS — BP 150/90 | HR 59 | Temp 98.0°F

## 2017-03-26 DIAGNOSIS — E119 Type 2 diabetes mellitus without complications: Secondary | ICD-10-CM

## 2017-03-26 DIAGNOSIS — H8110 Benign paroxysmal vertigo, unspecified ear: Secondary | ICD-10-CM

## 2017-03-26 LAB — GLUCOSE, POCT (MANUAL RESULT ENTRY): POC Glucose: 154 mg/dl — AB (ref 70–99)

## 2017-03-26 LAB — POCT GLYCOSYLATED HEMOGLOBIN (HGB A1C): Hemoglobin A1C: 7.1

## 2017-03-26 MED ORDER — MECLIZINE HCL 25 MG PO TABS: 25.0000 mg | ORAL_TABLET | Freq: Three times a day (TID) | ORAL | 0 refills | Status: DC | PRN

## 2017-03-26 NOTE — Patient Instructions (Signed)
Benign Positional Vertigo Vertigo is the feeling that you or your surroundings are moving when they are not. Benign positional vertigo is the most common form of vertigo. The cause of this condition is not serious (is benign). This condition is triggered by certain movements and positions (is positional). This condition can be dangerous if it occurs while you are doing something that could endanger you or others, such as driving. What are the causes? In many cases, the cause of this condition is not known. It may be caused by a disturbance in an area of the inner ear that helps your brain to sense movement and balance. This disturbance can be caused by a viral infection (labyrinthitis), head injury, or repetitive motion. What increases the risk? This condition is more likely to develop in:  Women.  People who are 50 years of age or older. What are the signs or symptoms? Symptoms of this condition usually happen when you move your head or your eyes in different directions. Symptoms may start suddenly, and they usually last for less than a minute. Symptoms may include:  Loss of balance and falling.  Feeling like you are spinning or moving.  Feeling like your surroundings are spinning or moving.  Nausea and vomiting.  Blurred vision.  Dizziness.  Involuntary eye movement (nystagmus). Symptoms can be mild and cause only slight annoyance, or they can be severe and interfere with daily life. Episodes of benign positional vertigo may return (recur) over time, and they may be triggered by certain movements. Symptoms may improve over time. How is this diagnosed? This condition is usually diagnosed by medical history and a physical exam of the head, neck, and ears. You may be referred to a health care provider who specializes in ear, nose, and throat (ENT) problems (otolaryngologist) or a provider who specializes in disorders of the nervous system (neurologist). You may have additional testing,  including:  MRI.  A CT scan.  Eye movement tests. Your health care provider may ask you to change positions quickly while he or she watches you for symptoms of benign positional vertigo, such as nystagmus. Eye movement may be tested with an electronystagmogram (ENG), caloric stimulation, the Dix-Hallpike test, or the roll test.  An electroencephalogram (EEG). This records electrical activity in your brain.  Hearing tests. How is this treated? Usually, your health care provider will treat this by moving your head in specific positions to adjust your inner ear back to normal. Surgery may be needed in severe cases, but this is rare. In some cases, benign positional vertigo may resolve on its own in 2-4 weeks. Follow these instructions at home: Safety   Move slowly.Avoid sudden body or head movements.  Avoid driving.  Avoid operating heavy machinery.  Avoid doing any tasks that would be dangerous to you or others if a vertigo episode would occur.  If you have trouble walking or keeping your balance, try using a cane for stability. If you feel dizzy or unstable, sit down right away.  Return to your normal activities as told by your health care provider. Ask your health care provider what activities are safe for you. General instructions   Take over-the-counter and prescription medicines only as told by your health care provider.  Avoid certain positions or movements as told by your health care provider.  Drink enough fluid to keep your urine clear or pale yellow.  Keep all follow-up visits as told by your health care provider. This is important. Contact a health care provider   if:  You have a fever.  Your condition gets worse or you develop new symptoms.  Your family or friends notice any behavioral changes.  Your nausea or vomiting gets worse.  You have numbness or a "pins and needles" sensation. Get help right away if:  You have difficulty speaking or moving.  You are  always dizzy.  You faint.  You develop severe headaches.  You have weakness in your legs or arms.  You have changes in your hearing or vision.  You develop a stiff neck.  You develop sensitivity to light. This information is not intended to replace advice given to you by your health care provider. Make sure you discuss any questions you have with your health care provider. Document Released: 09/16/2006 Document Revised: 05/16/2016 Document Reviewed: 04/03/2015 Elsevier Interactive Patient Education  2017 [employer].  How to Perform the Epley Maneuver The Epley maneuver is an exercise that relieves symptoms of vertigo. Vertigo is the feeling that you or your surroundings are moving when they are not. When you feel vertigo, you may feel like the room is spinning and have trouble walking. Dizziness is a little different than vertigo. When you are dizzy, you may feel unsteady or light-headed. You can do this maneuver at home whenever you have symptoms of vertigo. You can do it up to 3 times a day until your symptoms go away. Even though the Epley maneuver may relieve your vertigo for a few weeks, it is possible that your symptoms will return. This maneuver relieves vertigo, but it does not relieve dizziness. What are the risks? If it is done correctly, the Epley maneuver is considered safe. Sometimes it can lead to dizziness or nausea that goes away after a short time. If you develop other symptoms, such as changes in vision, weakness, or numbness, stop doing the maneuver and call your health care provider. How to perform the Epley maneuver (FIND VIDEO ONLINE AS WELL) 1. Sit on the edge of a bed or table with your back straight and your legs extended or hanging over the edge of the bed or table. 2. Turn your head halfway toward the affected ear or side. 3. Lie backward quickly with your head turned until you are lying flat on your back. You may want to position a pillow under your  shoulders. 4. Hold this position for 30 seconds. You may experience an attack of vertigo. This is normal. 5. Turn your head to the opposite direction until your unaffected ear is facing the floor. 6. Hold this position for 30 seconds. You may experience an attack of vertigo. This is normal. Hold this position until the vertigo stops. 7. Turn your whole body to the same side as your head. Hold for another 30 seconds. 8. Sit back up. You can repeat this exercise up to 3 times a day. Follow these instructions at home:  After doing the Epley maneuver, you can return to your normal activities.  Ask your health care provider if there is anything you should do at home to prevent vertigo. He or she may recommend that you:  Keep your head raised (elevated) with two or more pillows while you sleep.  Do not sleep on the side of your affected ear.  Get up slowly from bed.  Avoid sudden movements during the day.  Avoid extreme head movement, like looking up or bending over. Contact a health care provider if:  Your vertigo gets worse.  You have other symptoms, including:  Nausea.  Vomiting.  Headache. Get help right away if:  You have vision changes.  You have a severe or worsening headache or neck pain.  You cannot stop vomiting.  You have new numbness or weakness in any part of your body. Summary  Vertigo is the feeling that you or your surroundings are moving when they are not.  The Epley maneuver is an exercise that relieves symptoms of vertigo.  If the Epley maneuver is done correctly, it is considered safe. You can do it up to 3 times a day. This information is not intended to replace advice given to you by your health care provider. Make sure you discuss any questions you have with your health care provider. Document Released: 12/14/2013 Document Revised: 10/29/2016 Document Reviewed: 10/29/2016 Elsevier Interactive Patient Education  2017 [employer].

## 2017-03-26 NOTE — Progress Notes (Signed)
   Subjective:   Patient ID: Jenny [name], female    DOB: 05-13-1961, 56 y.o.   MRN: [id number]  Patient presents for Same Day Appointment  Chief Complaint  Patient presents with  . Dizziness    HPI: #Dizziness Got up at 230a this morning to clean up and got dizzy Sudden onset Felt like room spinning Lightheadedness when stands: yes Crawling to bathroom Palpitations or heart racing: no Prior dizziness: yes, had to lay down for a whole day for symptoms to resolve Medications tried: none Taking blood thinners: no  Symptoms Hearing Loss: no Ear Pain or fullness: no Nausea or vomiting: no Vision difficulty or double vision: no Falls: no Head trauma: no Weakness in arm or leg: yes at baseline Headache: no Pressure on eyes and photopobia  Review of Systems   See HPI for ROS.   History  Smoking Status  . Never Smoker  Smokeless Tobacco  . Never Used    Past medical history, surgical, family, and social history reviewed and updated in the EMR as appropriate.  Pertinent Historical Findings include: colon cancer s/p chemotherapy Objective:  BP (!) 154/90   Pulse (!) 59   Temp 98 F (36.7 C) (Oral)   SpO2 98%  Vitals and nursing note reviewed  Physical Exam  Constitutional: She is oriented to person, place, and time and well-developed, well-nourished, and in no distress.  Eyes: Conjunctivae are normal. Pupils are equal, round, and reactive to light. Right eye exhibits nystagmus. Left eye exhibits nystagmus.  Eyes remained closed during most of encounter  Cardiovascular: Normal rate and regular rhythm.   Pulmonary/Chest: Effort normal.  Neurological: She is alert and oriented to person, place, and time. No cranial nerve deficit.  Sitting in wheelchair due to unsteady stance    Assessment & Plan:  1. Benign paroxysmal positional vertigo, unspecified laterality Severe vertigo. Patient fall risk. Unsteady balance and stance. Had to be assisted in wheelchair. Unable  to perform Dix-Halpike maneuver or Epley maneuver on patient because unable tot get on bed. Unspecified laterality. Rx given for meclizine to help with vertigo. Urgent referral to vestibular rehab placed to get patient corrected. Encouraged patient to lay down and avoid moving much with her dizziness. Handout given for her to try and perform Epley maneuver at home with family.  - Ambulatory referral to Physical Therapy  2. Controlled type 2 diabetes mellitus without complication, without long-term current use of insulin (HCC) A1c checked today. To follow-up with PCP. - HgB A1c - Glucose (CBG)  Diagnosis and plan along with any newly prescribed medication(s) were discussed in detail with this patient today. The patient verbalized understanding and agreed with the plan. Patient advised if symptoms worsen return to clinic or ER.   PATIENT EDUCATION PROVIDED: See AVS   Luiz Blare, DO 03/26/2017, 9:47 AM PGY-3, [place]

## 2017-03-27 ENCOUNTER — Ambulatory Visit: Payer: BC Managed Care – PPO | Attending: Family Medicine

## 2017-04-03 ENCOUNTER — Emergency Department (HOSPITAL_COMMUNITY)
Admission: EM | Admit: 2017-04-03 | Discharge: 2017-04-03 | Disposition: A | Discharge status: Home / Self Care | Payer: BC Managed Care – PPO | Attending: Emergency Medicine | Admitting: Emergency Medicine

## 2017-04-03 ENCOUNTER — Emergency Department (HOSPITAL_COMMUNITY): Payer: BC Managed Care – PPO

## 2017-04-03 ENCOUNTER — Encounter (HOSPITAL_COMMUNITY): Payer: Self-pay | Admitting: *Deleted

## 2017-04-03 DIAGNOSIS — R42 Dizziness and giddiness: Secondary | ICD-10-CM | POA: Diagnosis present

## 2017-04-03 DIAGNOSIS — Z85038 Personal history of other malignant neoplasm of large intestine: Secondary | ICD-10-CM | POA: Diagnosis not present

## 2017-04-03 DIAGNOSIS — E119 Type 2 diabetes mellitus without complications: Secondary | ICD-10-CM | POA: Insufficient documentation

## 2017-04-03 LAB — BASIC METABOLIC PANEL
Anion gap: 10 (ref 5–15)
BUN: 13 mg/dL (ref 6–20)
CO2: 24 mmol/L (ref 22–32)
Calcium: 9.8 mg/dL (ref 8.9–10.3)
Chloride: 105 mmol/L (ref 101–111)
Creatinine, Ser: 0.66 mg/dL (ref 0.44–1.00)
GFR calc Af Amer: >60 mL/min (ref >60)
GFR calc non Af Amer: >60 mL/min (ref >60)
Glucose, Bld: 104 mg/dL — ABNORMAL HIGH (ref 65–99)
Potassium: 3.8 mmol/L (ref 3.5–5.1)
Sodium: 139 mmol/L (ref 135–145)

## 2017-04-03 LAB — CBC WITH DIFFERENTIAL/PLATELET
Basophils Absolute: 0 10*3/uL (ref 0.0–0.1)
Basophils Relative: 1 %
Eosinophils Absolute: 0.1 10*3/uL (ref 0.0–0.7)
Eosinophils Relative: 2 %
HCT: 39.6 % (ref 36.0–46.0)
Hemoglobin: 13.1 g/dL (ref 12.0–15.0)
Lymphocytes Relative: 43 %
Lymphs Abs: 2.8 10*3/uL (ref 0.7–4.0)
MCH: 27.4 pg (ref 26.0–34.0)
MCHC: 33.1 g/dL (ref 30.0–36.0)
MCV: 82.8 fL (ref 78.0–100.0)
Monocytes Absolute: 0.3 10*3/uL (ref 0.1–1.0)
Monocytes Relative: 4 %
Neutro Abs: 3.3 10*3/uL (ref 1.7–7.7)
Neutrophils Relative %: 50 %
Platelets: 269 10*3/uL (ref 150–400)
RBC: 4.78 MIL/uL (ref 3.87–5.11)
RDW: 15.2 % (ref 11.5–15.5)
WBC: 6.5 10*3/uL (ref 4.0–10.5)

## 2017-04-03 MED ORDER — ONDANSETRON HCL 4 MG/2ML IJ SOLN
4.0000 mg | Freq: Once | INTRAMUSCULAR | Status: DC
  Filled 2017-04-03: qty 2

## 2017-04-03 MED ORDER — MECLIZINE HCL 25 MG PO TABS: 25.0000 mg | ORAL_TABLET | Freq: Three times a day (TID) | ORAL | 0 refills | Status: DC | PRN

## 2017-04-03 MED ORDER — ONDANSETRON 4 MG PO TBDP
4.0000 mg | ORAL_TABLET | Freq: Once | ORAL | Status: AC
  Administered 2017-04-03: 4 mg via ORAL
  Filled 2017-04-03: qty 1

## 2017-04-03 MED ORDER — MECLIZINE HCL 25 MG PO TABS
25.0000 mg | ORAL_TABLET | Freq: Once | ORAL | Status: AC
  Administered 2017-04-03: 25 mg via ORAL
  Filled 2017-04-03: qty 1

## 2017-04-03 MED ORDER — ONDANSETRON 4 MG PO TBDP: 4.0000 mg | ORAL_TABLET | Freq: Three times a day (TID) | ORAL | 0 refills | Status: DC | PRN

## 2017-04-03 MED ORDER — SODIUM CHLORIDE 0.9 % IV BOLUS (SEPSIS): 1000.0000 mL | Freq: Once | INTRAVENOUS | Status: DC

## 2017-04-03 NOTE — ED Provider Notes (Signed)
[facility] DEPT Provider Note   CSN: [id number] Arrival date & time: 04/03/17  1226     History   Chief Complaint Chief Complaint  Patient presents with  . Dizziness  . Headache    HPI Jenny [name] is a 56 y.o. female.  The history is provided by the patient and medical records.  Dizziness  Associated symptoms: headaches   Headache      56 y.o. F with hx of anemia, hx of colon cancer, thyroid disease, presenting to the ED for headache and dizziness.  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  States she was seen by her PCP for similar symptoms on 03/26/2017. She was diagnosed with vertigo started on meclizine. Reports she took it for a few days but started feeling better so she stopped taking it.  States it recurred today, however this time was associated with bad headache.  She reports room spinning sensation to the point when she could not walk.  Does have nausea/vomiting as well.  States symptoms do get better when she sits still and keeps her eyes closed.  Headache is generalized, throbbing.  Husband reports she did not eat very much today.  She denies any head injury or falls. She is not on anticoagulation.  Past Medical History:  Diagnosis Date  . Anemia   . Cancer [facility])    colon cancer   . Enlarged thyroid     Patient Active Problem List   Diagnosis Date Noted  . Back pain 12/25/2016  . Chemotherapy-induced peripheral neuropathy ([place]) 01/06/2016  . Diabetes type 2, controlled ([place]) 12/07/2015  . Thyromegaly 12/14/2014  . Cancer of sigmoid colon metastatic to intra-abdominal lymph node ([place]) 11/14/2014  . Obesity 05/29/2009    Past Surgical History:  Procedure Laterality Date  . BOWEL RESECTION    . CERVICAL SPINE SURGERY  06/17/2012   C5-C7 ACDF  . CESAREAN SECTION    . PARTIAL HYSTERECTOMY    . PORT-A-CATH REMOVAL Left 07/28/2015   Procedure: REMOVAL PORT-A-CATH;  Surgeon: [name], MD;  Location: [facility] ORS;  Service:  General;  Laterality: Left;  . PORTACATH PLACEMENT Left 12/22/2014   Procedure: INSERTION PORT-A-CATH LEFT SUBCLAVIAN;  Surgeon: [name], MD;  Location: [facility] ORS;  Service: General;  Laterality: Left;  . TONSILLECTOMY      OB History    No data available       Home Medications    Prior to Admission medications   Medication Sig Start Date End Date Taking? Authorizing Provider  acetaminophen (TYLENOL) 500 MG tablet Take 500 mg by mouth every 6 (six) hours as needed.    Historical Provider, MD  amoxicillin-clavulanate (AUGMENTIN) 875-125 MG tablet Take 1 tablet by mouth every 12 (twelve) hours. 02/01/17   Jacqualine Mau, NP  benzonatate (TESSALON) 100 MG capsule Take 1 capsule (100 mg total) by mouth every 8 (eight) hours. 02/01/17   Jacqualine Mau, NP  diphenhydrAMINE (BENADRYL) 12.5 MG/5ML liquid Take 12.5-25 mg by mouth every 4 (four) hours as needed for itching or allergies.    Historical Provider, MD  fluticasone (FLONASE) 50 MCG/ACT nasal spray Place 2 sprays into both nostrils daily. 02/01/17   Jacqualine Mau, NP  gabapentin (NEURONTIN) 300 MG capsule Take 1 capsule (300 mg total) by mouth at bedtime. 02/13/17   Truitt Merle, MD  HYDROcodone-acetaminophen (NORCO/VICODIN) 5-325 MG tablet Take 1 tablet by mouth every 12 (twelve) hours as needed for moderate pain. Patient not taking: Reported on  12/25/2016 04/12/16   Truitt Merle, MD  hydrocortisone cream 0.5 % Apply 1 application topically 2 (two) times daily as needed for itching.    Historical Provider, MD  loratadine (CLARITIN) 10 MG tablet Take 10 mg by mouth daily. AS NEEDED    Historical Provider, MD  meclizine (ANTIVERT) 25 MG tablet Take 1 tablet (25 mg total) by mouth 3 (three) times daily as needed for dizziness. 03/26/17   Katheren Shams, DO  Multiple Vitamin (MULTIVITAMIN) tablet Take 1 tablet by mouth daily.    Historical Provider, MD  naproxen sodium (ANAPROX) 220 MG tablet Take 1 tablet (220 mg total) by mouth 2  (two) times daily as needed. 12/25/16   Lind Covert, MD  ondansetron (ZOFRAN) 8 MG tablet Take 1 by mouth twice a day for 2 days after chemotherapy and then twice daily as needed Patient not taking: Reported on 02/13/2017 12/28/14   Truitt Merle, MD  triamcinolone (KENALOG) 0.025 % ointment Apply 1 application topically 2 (two) times daily. 12/14/14   Lind Covert, MD    Family History Family History  Problem Relation Age of Onset  . Cancer Brother   . Cancer Brother   . Hypertension Mother   . Colon cancer Neg Hx     Social History Social History  Substance Use Topics  . Smoking status: Never Smoker  . Smokeless tobacco: Never Used  . Alcohol use No     Allergies   Other and Shellfish allergy   Review of Systems Review of Systems  Neurological: Positive for dizziness and headaches.  All other systems reviewed and are negative.    Physical Exam Updated Vital Signs SpO2 97%   Physical Exam  Constitutional: She is oriented to person, place, and time. She appears well-developed and well-nourished. No distress.  Lying on left side with eyes closed, not moving willingly, speaking softly  HENT:  Head: Normocephalic and atraumatic.  Right Ear: External ear normal.  Left Ear: External ear normal.  Mouth/Throat: Oropharynx is clear and moist.  Eyes: Conjunctivae and EOM are normal. Pupils are equal, round, and reactive to light.  Neck: Normal range of motion and full passive range of motion without pain. Neck supple. No neck rigidity.  No rigidity, no meningismus  Cardiovascular: Normal rate, regular rhythm and normal heart sounds.   No murmur heard. Pulmonary/Chest: Effort normal and breath sounds normal. No respiratory distress. She has no wheezes. She has no rhonchi.  Abdominal: Soft. Bowel sounds are normal. There is no tenderness. There is no rebound and no guarding.  Musculoskeletal: Normal range of motion. She exhibits no edema.  Neurological: She is alert  and oriented to person, place, and time. She has normal strength. She displays no tremor. No cranial nerve deficit or sensory deficit. She displays no seizure activity.  AAOx3, answering questions and following commands appropriately; equal strength UE and LE bilaterally; CN grossly intact; moves all extremities appropriately without ataxia; no focal neuro deficits or facial asymmetry appreciated  Skin: Skin is warm and dry. No rash noted. She is not diaphoretic.  Psychiatric: She has a normal mood and affect. Her behavior is normal. Thought content normal.  Nursing note and vitals reviewed.    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 Associated headache, dizziness and nausea. EXAM: CT HEAD WITHOUT CONTRAST TECHNIQUE: Contiguous axial images were obtained from the base of the skull through the vertex without intravenous contrast. COMPARISON:  None. FINDINGS: Brain: No evidence of malformation, atrophy, old or acute small or large vessel infarction, mass lesion, hemorrhage, hydrocephalus or extra-axial collection. No evidence of pituitary lesion. Vascular: No vascular calcification.  No hyperdense vessels. Skull: Normal.  No fracture or focal bone lesion. Sinuses/Orbits: Visualized sinuses are clear. No fluid in the middle ears or mastoids. Visualized orbits are normal. Other: None significant IMPRESSION: Normal head CT Electronically Signed   By: Nelson Chimes M.D.   On: 04/03/2017 14:31    Procedures Procedures (including critical care time)  Medications Ordered in  ED Medications  meclizine (ANTIVERT) tablet 25 mg (25 mg Oral Given 04/03/17 1405)  ondansetron (ZOFRAN-ODT) disintegrating tablet 4 mg (4 mg Oral Given 04/03/17 1429)     Initial Impression / Assessment and Plan / ED Course  I have reviewed the triage vital signs and the nursing notes.  Pertinent labs & imaging results that were available during my care of the patient were reviewed by me and considered in my medical decision making (see chart for details).  56 y.o. F here with dizziness.  Seen for same by PCP 8 days ago, diagnosed with vertigo.  Got better with meclizine but stopped taking it.  Recurrent symptoms today accompanied by headache.  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  VSS.  Neurologic exam non-focal.  Dizziness is reproduced with movement.  No work-up with PCP last week.  Given recurrent symptoms, will obtain labs.  Also has hx of colon CA, will obtain CT head.  Lab work reassuring.  CT head negative.  Symptoms improved with medications, some mild drowsiness which she reports has happened before.  Requesting to eat now.  Remains neurologically intact.    3:53 PM Care signed out to oncoming PA.  Once drowsiness wears off and tolerating PO, feel she can be discharged home.  Will continue treatment at home PRN with meclizine, zofran.  Will have her follow-up with PCP as well.  Patient and husband updated with care plan, they are in agreement with this.  Final Clinical Impressions(s) / ED Diagnoses   Final diagnoses:  Dizziness  Vertigo    New Prescriptions New Prescriptions   MECLIZINE (ANTIVERT) 25 MG TABLET    Take 1 tablet (25 mg total) by mouth 3 (three) times daily as needed for dizziness.   ONDANSETRON (ZOFRAN ODT) 4 MG DISINTEGRATING TABLET    Take 1 tablet (4 mg total) by mouth every 8 (eight) hours as needed for nausea.     Larene Pickett, PA-C 04/03/17 [place], MD 04/10/17 1046

## 2017-04-03 NOTE — ED Notes (Addendum)
RN attempted IV multiple times unsuccessfully. Will ask phlebotomy to attempt blood draw.  Pt states she wants to try PO fluids and zofran. Will make MD aware.

## 2017-04-03 NOTE — ED Notes (Signed)
Patient has been tolerating fluids and crackers well.

## 2017-04-03 NOTE — Discharge Instructions (Signed)
Take the prescribed medication as directed.  Make sure to drink fluids to stay hydrated. °Follow-up with your primary care doctor. °Return to the ED for new or worsening symptoms. °

## 2017-04-03 NOTE — ED Notes (Signed)
Pt was in a rush to leave and would not let me get vitals because they were hungry and wanted to go.

## 2017-04-03 NOTE — ED Triage Notes (Signed)
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 Pt was diagnosed with vertigo and is on medications for it. Pt states she did not take her vertigo medication today.

## 2017-06-02 ENCOUNTER — Other Ambulatory Visit: Payer: BC Managed Care – PPO

## 2017-06-03 ENCOUNTER — Telehealth: Payer: Self-pay | Admitting: Hematology

## 2017-06-03 NOTE — Telephone Encounter (Signed)
Per 6/10 schedule message moved 6/13 lab to the week 6/25 and 6/18 f/u to the week of 7/2. Left message for patient re change with new appointments for 6/27 and 7/3. Schedule mailed. Added comment to ct orders to schedule scan for 6/27.

## 2017-06-04 ENCOUNTER — Other Ambulatory Visit: Payer: BC Managed Care – PPO

## 2017-06-09 ENCOUNTER — Ambulatory Visit: Payer: BC Managed Care – PPO | Admitting: Hematology

## 2017-06-18 ENCOUNTER — Other Ambulatory Visit (HOSPITAL_BASED_OUTPATIENT_CLINIC_OR_DEPARTMENT_OTHER): Payer: BC Managed Care – PPO

## 2017-06-18 ENCOUNTER — Ambulatory Visit (HOSPITAL_COMMUNITY)
Admission: RE | Admit: 2017-06-18 | Discharge: 2017-06-18 | Disposition: A | Discharge status: Home / Self Care | Payer: BC Managed Care – PPO | Source: Ambulatory Visit | Attending: Hematology | Admitting: Hematology

## 2017-06-18 DIAGNOSIS — C772 Secondary and unspecified malignant neoplasm of intra-abdominal lymph nodes: Secondary | ICD-10-CM | POA: Insufficient documentation

## 2017-06-18 DIAGNOSIS — K760 Fatty (change of) liver, not elsewhere classified: Secondary | ICD-10-CM | POA: Insufficient documentation

## 2017-06-18 DIAGNOSIS — C187 Malignant neoplasm of sigmoid colon: Secondary | ICD-10-CM | POA: Diagnosis not present

## 2017-06-18 DIAGNOSIS — E041 Nontoxic single thyroid nodule: Secondary | ICD-10-CM | POA: Diagnosis not present

## 2017-06-18 DIAGNOSIS — I700 Atherosclerosis of aorta: Secondary | ICD-10-CM | POA: Diagnosis not present

## 2017-06-18 DIAGNOSIS — Z9889 Other specified postprocedural states: Secondary | ICD-10-CM | POA: Insufficient documentation

## 2017-06-18 LAB — COMPREHENSIVE METABOLIC PANEL
ALT: 21 U/L (ref 0–55)
AST: 15 U/L (ref 5–34)
Albumin: 4.1 g/dL (ref 3.5–5.0)
Alkaline Phosphatase: 66 U/L (ref 40–150)
Anion Gap: 11 mEq/L (ref 3–11)
BUN: 14.8 mg/dL (ref 7.0–26.0)
CO2: 25 mEq/L (ref 22–29)
Calcium: 10.1 mg/dL (ref 8.4–10.4)
Chloride: 106 mEq/L (ref 98–109)
Creatinine: 0.8 mg/dL (ref 0.6–1.1)
EGFR: >90 mL/min/{1.73_m2} (ref >90)
Glucose: 138 mg/dl (ref 70–140)
Potassium: 4.1 mEq/L (ref 3.5–5.1)
Sodium: 142 mEq/L (ref 136–145)
Total Bilirubin: 0.47 mg/dL (ref 0.20–1.20)
Total Protein: 8 g/dL (ref 6.4–8.3)

## 2017-06-18 LAB — CBC WITH DIFFERENTIAL/PLATELET
BASO%: 0.5 % (ref 0.0–2.0)
Basophils Absolute: 0 10*3/uL (ref 0.0–0.1)
EOS%: 2.7 % (ref 0.0–7.0)
Eosinophils Absolute: 0.2 10*3/uL (ref 0.0–0.5)
HCT: 39.3 % (ref 34.8–46.6)
HGB: 12.8 g/dL (ref 11.6–15.9)
LYMPH%: 42.2 % (ref 14.0–49.7)
MCH: 26.8 pg (ref 25.1–34.0)
MCHC: 32.7 g/dL (ref 31.5–36.0)
MCV: 82.1 fL (ref 79.5–101.0)
MONO#: 0.4 10*3/uL (ref 0.1–0.9)
MONO%: 4.7 % (ref 0.0–14.0)
NEUT#: 3.7 10*3/uL (ref 1.5–6.5)
NEUT%: 49.9 % (ref 38.4–76.8)
Platelets: 294 10*3/uL (ref 145–400)
RBC: 4.79 10*6/uL (ref 3.70–5.45)
RDW: 15.6 % — ABNORMAL HIGH (ref 11.2–14.5)
WBC: 7.5 10*3/uL (ref 3.9–10.3)
lymph#: 3.2 10*3/uL (ref 0.9–3.3)

## 2017-06-18 MED ORDER — IOPAMIDOL (ISOVUE-300) INJECTION 61%
INTRAVENOUS | Status: AC
  Filled 2017-06-18: qty 100

## 2017-06-18 MED ORDER — IOPAMIDOL (ISOVUE-300) INJECTION 61%
100.0000 mL | Freq: Once | INTRAVENOUS | Status: AC | PRN
  Administered 2017-06-18: 100 mL via INTRAVENOUS

## 2017-06-19 LAB — CEA (IN HOUSE-CHCC): CEA (CHCC-In House): <1 ng/mL (ref 0.00–5.00)

## 2017-06-23 NOTE — Progress Notes (Signed)
[facility]  [phone] [phone]  Clinic Follow Up Note   Patient Care Team: Lind Covert, MD as PCP - General Juanita Craver, MD as Consulting Physician (Gastroenterology) [name], MD as Consulting Physician (General Surgery) Truitt Merle, MD as Consulting Physician (Hematology) 06/24/2017  CHIEF COMPLAINTS Follow up colon cancer   DIAGNOSIS:  Sigmoid colon cancer   Staging form: Colon and Rectum, AJCC 7th Edition     Clinical: Stage IIIB (T3, N1a, M0) - Signed by Truitt Merle, MD on 12/28/2014     Cancer of sigmoid colon metastatic to intra-abdominal lymph node ([place])   10/10/2014 Imaging    CT abdomen and pelvis: Eccentric wall thickening along the sigmoid colon, worrisome for early colonic adenocarcinoma. No findings suspicious for metastatic disease. CT chest (-)         11/14/2014 Initial Diagnosis    Colon cancer      11/14/2014 Pathologic Stage    pT3pN1pMx, G2, tumor invading through the muscular propria into pericolonic fatty tissue.  LVI (-), PNI (-). 1/28 node positive, margins negative.       11/14/2014 Surgery    sigmoid colon segmental resection, margins (-).       12/28/2014 - 05/17/2015 Adjuvant Chemotherapy    [id number], 10% dose reduction due to neuropathy and cytopenia from cycle 5, oxaliplatin held from cycle 8 due to leg pain and neuropathy. chemo stopped after cycle 10 due to her tolerance issue.       12/29/2015 Imaging    CT chest, abdomen and pelvis with contrast showed no evidence of recurrence.      06/19/2017 Imaging    CT CAPw contrast IMPRESSION: No evidence recurrent or metastatic colon carcinoma.  Stable mild hepatic steatosis.  5.1 cm right thyroid lobe nodule measures larger in size compared to recent studies. Thyroid ultrasound recommended for further evaluation. This recommendation follows ACR consensus guidelines: Managing Incidental Thyroid Nodules Detected on Imaging: White Paper of  the ACR Incidental Thyroid Findings Committee. J Am Coll Radiol 2015;12(2):143-150.  Aortic atherosclerosis.       HISTORY OF INITIAL PRESENTING ILLNESS:  Jenny [name] 56 y.o. female without significant past medical history, who is referred by her surgeon Dr. Marcello Moores to discuss adjuvant chemotherapy for her resected colon cancer.  She presented with abdominal pain for 8-9 month, which was related to position. She noticed bloody stool in Aug 2015. She was seen by PCP, and was referred to GI Dr. Collene Mares, and underwent colonoscopy which showed a mass at sigmoid colon (per patient, I do not have the report). Biopsy showed adenocarcinoma. She was then referred to surgeon Dr. Marcello Moores and underwent sigmoid colon segmentectomy on 11/14/2014.   TREATMENT: Surveillance  INTERIM HISTORY: Jenny [name] returns for follow-up. She has been getting better. She has went back to school. Her neuropathy is still present. She takes Neurontin which is causing drowsiness throughout the day. She denies any falls. She does not like to take medication. She is having pains all throughout her body and she tries to just deal with it.   MEDICAL HISTORY:  Past Medical History:  Diagnosis Date  . Anemia   . Cancer [facility])    colon cancer   . Enlarged thyroid     SURGICAL HISTORY: Past Surgical History:  Procedure Laterality Date  . BOWEL RESECTION    . CERVICAL SPINE SURGERY  06/17/2012   C5-C7 ACDF  . CESAREAN SECTION    . PARTIAL HYSTERECTOMY    . PORT-A-CATH  REMOVAL Left 07/28/2015   Procedure: REMOVAL PORT-A-CATH;  Surgeon: [name], MD;  Location: [facility];  Service: General;  Laterality: Left;  . PORTACATH PLACEMENT Left 12/22/2014   Procedure: INSERTION PORT-A-CATH LEFT SUBCLAVIAN;  Surgeon: [name], MD;  Location: [facility];  Service: General;  Laterality: Left;  . TONSILLECTOMY      SOCIAL HISTORY: History   Social History  . Marital Status: Married    Spouse Name: N/A    Number of Children:  2  . Years of Education: N/A   Occupational History  . Not on file.   Social History Main Topics  . Smoking status: Never Smoker   . Smokeless tobacco: Never Used  . Alcohol Use: No  . Drug Use: No  . Sexual Activity: Not on file    P2G2, one child was murdered. She is a Paramedic.   FAMILY HISTORY: Family History  Problem Relation Age of Onset  . Colon cancer Neg Hx   . Liver Cancer Brother        . Hypertension Mother     ALLERGIES:  is allergic to other and shellfish allergy.  MEDICATIONS:  Current Outpatient Prescriptions  Medication Sig Dispense Refill  . acetaminophen (TYLENOL) 500 MG tablet Take 500 mg by mouth every 6 (six) hours as needed.    Marland Kitchen amoxicillin-clavulanate (AUGMENTIN) 875-125 MG tablet Take 1 tablet by mouth every 12 (twelve) hours. 14 tablet 0  . benzonatate (TESSALON) 100 MG capsule Take 1 capsule (100 mg total) by mouth every 8 (eight) hours. 21 capsule 0  . diphenhydrAMINE (BENADRYL) 12.5 MG/5ML liquid Take 12.5-25 mg by mouth every 4 (four) hours as needed for itching or allergies.    . fluticasone (FLONASE) 50 MCG/ACT nasal spray Place 2 sprays into both nostrils daily. 16 g 2  . gabapentin (NEURONTIN) 300 MG capsule Take 1 capsule (300 mg total) by mouth 3 (three) times daily. 90 capsule 3  . HYDROcodone-acetaminophen (NORCO/VICODIN) 5-325 MG tablet Take 1 tablet by mouth every 12 (twelve) hours as needed for moderate pain. (Patient not taking: Reported on 12/25/2016) 30 tablet 0  . hydrocortisone cream 0.5 % Apply 1 application topically 2 (two) times daily as needed for itching.    . loratadine (CLARITIN) 10 MG tablet Take 10 mg by mouth daily. AS NEEDED    . meclizine (ANTIVERT) 25 MG tablet Take 1 tablet (25 mg total) by mouth 3 (three) times daily as needed for dizziness. 30 tablet 0  . Multiple Vitamin (MULTIVITAMIN) tablet Take 1 tablet by mouth daily.    . naproxen sodium (ANAPROX) 220 MG tablet Take 1 tablet (220 mg total) by  mouth 2 (two) times daily as needed. 60 tablet 1  . ondansetron (ZOFRAN ODT) 4 MG disintegrating tablet Take 1 tablet (4 mg total) by mouth every 8 (eight) hours as needed for nausea. 10 tablet 0  . ondansetron (ZOFRAN) 8 MG tablet Take 1 by mouth twice a day for 2 days after chemotherapy and then twice daily as needed (Patient not taking: Reported on 02/13/2017) 30 tablet 1  . triamcinolone (KENALOG) 0.025 % ointment Apply 1 application topically 2 (two) times daily. 30 g 1   No current facility-administered medications for this visit.     REVIEW OF SYSTEMS:   Constitutional: Denies fevers, chills or abnormal night sweats (+) fatigue (+) full body aches Eyes: Denies blurriness of vision, double vision or watery eyes Ears, nose, mouth, throat, and face: Denies mucositis or sore throat Respiratory: Denies  cough, dyspnea or wheezes Cardiovascular: Denies palpitation, chest discomfort or lower extremity swelling Gastrointestinal:  Denies nausea, heartburn or change in bowel habits  Skin: Denies abnormal skin rashes Lymphatics: Denies new lymphadenopathy or easy bruising Neurological:Denies numbness, tingling or new weaknesses (+) neuropathy in hands and feet Behavioral/Psych: Mood is stable, no new changes  All other systems were reviewed with the patient and are negative.  PHYSICAL EXAMINATION:  ECOG PERFORMANCE STATUS: 1  BP 123/75 (BP Location: Right Arm, Patient Position: Sitting)   Pulse 74   Temp 98.4 F (36.9 C) (Oral)   Resp 18   Ht 5' 5.5" (1.664 m)   Wt 257 lb 14.4 oz (117 kg)   SpO2 98%   BMI 42.26 kg/m    GENERAL:alert, no distress and comfortable, obese  SKIN: skin color, texture, turgor are normal, no rashes or significant lesions EYES: normal, conjunctiva are pink and non-injected, sclera clear OROPHARYNX:no exudate, no erythema and lips, buccal mucosa, and tongue normal  NECK: supple, thyroid normal size, non-tender, without nodularity LYMPH:  no palpable  lymphadenopathy in the cervical, axillary or inguinal LUNGS: clear to auscultation and percussion with normal breathing effort HEART: regular rate & rhythm and no murmurs and no lower extremity edema ABDOMEN:abdomen soft, non-tender and normal bowel sounds. Surgical wound has well-healed  Musculoskeletal:no cyanosis of digits and no clubbing  PSYCH: alert & oriented x 3 with fluent speech NEURO: no focal motor/sensory deficits  LABORATORY DATA:  I have reviewed the data as listed CBC Latest Ref Rng & Units 06/18/2017 04/03/2017 02/10/2017  WBC 3.9 - 10.3 10e3/uL 7.5 6.5 6.2  Hemoglobin 11.6 - 15.9 g/dL 12.8 13.1 12.5  Hematocrit 34.8 - 46.6 % 39.3 39.6 37.6  Platelets 145 - 400 10e3/uL 294 269 281    CMP Latest Ref Rng & Units 06/18/2017 04/03/2017 02/10/2017  Glucose 70 - 140 mg/dl 138 104(H) 115  BUN 7.0 - 26.0 mg/dL 14.8 13 14.1  Creatinine 0.6 - 1.1 mg/dL 0.8 0.66 0.8  Sodium 136 - 145 mEq/L 142 139 141  Potassium 3.5 - 5.1 mEq/L 4.1 3.8 3.9  Chloride 101 - 111 mmol/L - 105 -  CO2 22 - 29 mEq/L _0 Calcium 8.4 - 10.4 mg/dL 10.1 9.8 10.2  Total Protein 6.4 - 8.3 g/dL 8.0 - 7.9  Total Bilirubin 0.20 - 1.20 mg/dL 0.47 - 0.42  Alkaline Phos 40 - 150 U/L 66 - 64  AST 5 - 34 U/L 15 - 16  ALT 0 - 55 U/L 21 - 20   Results for Jenny [name], Jenny [name] (MRN [id number]) as of 06/25/2017 17:38  Ref. Range 11/11/2016 09:05 02/10/2017 15:44 06/18/2017 16:10  CEA Latest Ref Range: 0.0 - 4.7 ng/mL 2.1 2.3   CEA ([facility]) Latest Ref Range: 0.00 - 5.00 ng/mL <1.00 <1.00 <1.00    PATHOLOGY REPORT 11/14/2014 Diagnosis(continued) 1. Colon, segmental resection for tumor, sigmoid - INVASIVE ADENOCARCINOMA, INVADING THROUGH THE MUSCULARIS PROPRIA INTO PERICOLONIC FATTY TISSUE. - ONE OF TWENTY-EIGHT LYMPH NODES, POSITIVE FOR METASTATIC CARCINOMA (1/28). - RESECTION MARGINS, NEGATIVE FOR ATYPIA OR MALIGNANCY - RESECTION MARGINS, NEGATIVE FOR ATYPIA OR MALIGNANCY. 2. Colon, resection margin (donut),  colon final distal margin - BENIGN COLONIC MUCOSA, NO EVIDENCE OF MALIGNANCY. Microscopic Comment 1. COLON Specimen: Sigmoid colon Procedure: Segmental resection Tumor site: Sigmoid colon Specimen integrity: Intact Macroscopic intactness of mesorectum: N/A Macroscopic tumor perforation: No Invasive tumor: Maximum size: 2.7 cm, gross measurement Histologic type(s): Invasive adenocarcinoma Histologic grade and differentiation: G2: moderately differentiated/low grade Type  of polyp tumor arose from: Tubular adenoma Microscopic extension of invasive tumor: Invading through the muscularis propria into pericolonic fatty tissue. Lymph-Vascular invasion: Not identified Peri-neural invasion: Not identified Tumor deposit(s) (discontinuous extramural extension): N/A Resection margins: Negative Open margin: 7.1 cm Stapled margin: 5.5 cm Mesenteric margin (sigmoid and transverse): 9 cm Treatment effect (neo-adjuvant therapy): No Additional polyp(s): N/A Non-neoplastic findings: N/A Lymph nodes: number examined 28; number positive: 1 Pathologic Staging: pT3, pN1a, pMX Ancillary studies: MMR stains will be performed and an addendum report will follow. MSI testing will be performed at an outside institution and the results will be available in EPIC. (HCL:kh 11-15-14)  TUMOR MSI: stable (by PCR),  MMR: NORMAL   RADIOGRAPHIC STUDIES: I have personally reviewed the radiological images as listed and agreed with the findings in the report.  CT CAPw contrast 06/19/2017 IMPRESSION: No evidence recurrent or metastatic colon carcinoma.  Stable mild hepatic steatosis.  5.1 cm right thyroid lobe nodule measures larger in size compared to recent studies. Thyroid ultrasound recommended for further evaluation. This recommendation follows ACR consensus guidelines: Managing Incidental Thyroid Nodules Detected on Imaging: White Paper of the ACR Incidental Thyroid Findings Committee. J Am Coll  Radiol 2015;12(2):143-150.  Aortic atherosclerosis.  CT Head wo contrast 04/03/2017 - normal head CT  US Breast LTD Unilateral Left Axilla 10/09/2016 IMPRESSION: Stable adjacent likely benign masses involving the lower outer periareolar left breast, likely fibroadenomas.  CT chest, abdomen and pelvis 07/01/2016 IMPRESSION: 1. Stable exam. No new or progressive findings to suggest recurrent/metastatic disease. 2. Hepatic steatosis. 3. Stable asymmetric enlargement right thyroid lobe, consistent with underlying nodule/mass.  ASSESSMENT & PLAN:  56 y.o. African-American female, without significant past medical history, presents with abdominal pain and bloody bowel movement, anemia, was found to have a sigmoid adenocarcinoma, status post complete surgical resection.   1. Sigmoid colon adenocarcinoma, G2, pT3pN1aM0, stage IIIB, MSI stable  -the nature history of colon cancer, and high risk of cancer recurrence. IIIB disease with previously discussed and reviewed with her -She is clinically doing well, still has some residual neuropathy and body aches from chemotherapy.  -Her physical exam today was normal. I reviewed her lab results, including CEA, which are all normal. -I previously discussed her repeat his CT scan from 07/01/2016, which showed NED -we'll continue surveillance. I'll see her every 3-4 months for the first 2 years, then every 6 months afterwards for total 5 years. We will check her lab and CEA was each visit, CT scan every 6-12 months. -her colonoscopy in 10/2015 was unremarkable ,done by Dr. Collene Mares   - She is close to 3 years out of her diagnosis now, we again discussed the risk of recurrence decreases significantly after the first 2-3 years. -scan results discussed, NED. She is doing well. No evidence of cancer reoccurance -I recommend right thyroid nodule biopsy. She is agreeable. She will call 2 days after to discuss results  2.  Peripheral neuropathy, b/l leg and body  pain, secondary to chemo -overall has improved. She takes Neurontin, Tylenol or ibuprofen as needed,  And Vicodin once to twice  A week -She has completed physical therapy and will continue exercise  - I strongly encouragedged her to take Neurontin at night and try tot take less throughout the day to avoid increased fatigue - due to pain reoccurance and her reluctance to taking mediation, I recommend trying acupuncture or pain clinic. She will think about it.   3. Obesity and  Hepatic steatosis - I encouraged her to  lose some weight, by diet control and exercise -she has mild fatty liver on CT scan,  I encouraged her to follow-up with her primary care physician and check her lipid profile at least once a year. I encouraged her to lose weight.  4. Diabetes -Not on medication -Newly diagnosed. -Monitored by PCP       Plan  -lab and CT scan results reviewed  -Refill Gabapentin  - schedule thyroid nodule biopsy in 1-2 weeks, I will call her after her biopsy  -lab and f/u in 6 months  All questions were answered. The patient knows to call the clinic with any problems,  questions or concerns.  I spent 20 minutes counseling the patient face to face. The total time spent in the appointment was 25 minutes and more than 50% was on counseling.  This document serves as a record of services personally performed by Truitt Merle, MD. It was created on her behalf by Brandt Loosen, a trained medical scribe. The creation of this record is based on the scribe's personal observations and the provider's statements to them. This document has been checked and approved by the attending provider.   I have reviewed the above documentation for accuracy and completeness and I agree with the above.   Truitt Merle, MD 06/24/2017

## 2017-06-24 ENCOUNTER — Ambulatory Visit (HOSPITAL_BASED_OUTPATIENT_CLINIC_OR_DEPARTMENT_OTHER): Payer: BC Managed Care – PPO | Admitting: Hematology

## 2017-06-24 ENCOUNTER — Telehealth: Payer: Self-pay | Admitting: Hematology

## 2017-06-24 VITALS — BP 123/75 | HR 74 | Temp 98.4°F | Resp 18 | Ht 65.5 in | Wt 257.9 lb

## 2017-06-24 DIAGNOSIS — G620 Drug-induced polyneuropathy: Secondary | ICD-10-CM

## 2017-06-24 DIAGNOSIS — T451X5A Adverse effect of antineoplastic and immunosuppressive drugs, initial encounter: Secondary | ICD-10-CM

## 2017-06-24 DIAGNOSIS — E041 Nontoxic single thyroid nodule: Secondary | ICD-10-CM | POA: Diagnosis not present

## 2017-06-24 DIAGNOSIS — E6609 Other obesity due to excess calories: Secondary | ICD-10-CM

## 2017-06-24 DIAGNOSIS — G893 Neoplasm related pain (acute) (chronic): Secondary | ICD-10-CM | POA: Diagnosis not present

## 2017-06-24 DIAGNOSIS — C772 Secondary and unspecified malignant neoplasm of intra-abdominal lymph nodes: Secondary | ICD-10-CM | POA: Diagnosis not present

## 2017-06-24 DIAGNOSIS — C187 Malignant neoplasm of sigmoid colon: Secondary | ICD-10-CM | POA: Diagnosis not present

## 2017-06-24 MED ORDER — GABAPENTIN 300 MG PO CAPS: 300.0000 mg | ORAL_CAPSULE | Freq: Three times a day (TID) | ORAL | 3 refills | Status: DC

## 2017-06-24 NOTE — Telephone Encounter (Signed)
Scheduled appt per 7/3 los - Gave patient AVS and calender per los.  

## 2017-06-25 ENCOUNTER — Encounter: Payer: Self-pay | Admitting: Hematology

## 2017-06-26 ENCOUNTER — Other Ambulatory Visit: Payer: Self-pay | Admitting: Hematology

## 2017-06-26 DIAGNOSIS — E041 Nontoxic single thyroid nodule: Secondary | ICD-10-CM

## 2017-07-02 ENCOUNTER — Other Ambulatory Visit: Payer: BC Managed Care – PPO

## 2017-07-10 ENCOUNTER — Other Ambulatory Visit (HOSPITAL_COMMUNITY)
Admission: RE | Admit: 2017-07-10 | Discharge: 2017-07-10 | Disposition: A | Discharge status: Home / Self Care | Payer: BC Managed Care – PPO | Source: Ambulatory Visit | Attending: Student | Admitting: Student

## 2017-07-10 ENCOUNTER — Ambulatory Visit
Admission: RE | Admit: 2017-07-10 | Discharge: 2017-07-10 | Disposition: A | Discharge status: Home / Self Care | Payer: BC Managed Care – PPO | Source: Ambulatory Visit | Attending: Hematology | Admitting: Hematology

## 2017-07-10 ENCOUNTER — Other Ambulatory Visit: Payer: Self-pay | Admitting: Hematology

## 2017-07-10 DIAGNOSIS — E041 Nontoxic single thyroid nodule: Secondary | ICD-10-CM

## 2017-07-17 ENCOUNTER — Telehealth: Payer: Self-pay | Admitting: *Deleted

## 2017-07-17 NOTE — Telephone Encounter (Signed)
-----   Message from Truitt Merle, MD sent at 07/15/2017 10:37 PM EDT ----- Please call pt and let her know the thyroid nodule biopsy which was benign. No need for treatment for now. Thanks.   Truitt Merle  07/15/2017

## 2017-07-17 NOTE — Telephone Encounter (Signed)
Spoke with pt and informed her of thyroid nodule biopsy was benign.  No need for treatment for now as per Dr. Ernestina Penna instructions.  Pt voiced understanding, and understood to call office back with any changes.

## 2017-10-30 ENCOUNTER — Ambulatory Visit (HOSPITAL_COMMUNITY)
Admission: EM | Admit: 2017-10-30 | Discharge: 2017-10-30 | Disposition: A | Discharge status: Home / Self Care | Payer: BC Managed Care – PPO | Attending: Family Medicine | Admitting: Family Medicine

## 2017-10-30 ENCOUNTER — Encounter (HOSPITAL_COMMUNITY): Payer: Self-pay | Admitting: Family Medicine

## 2017-10-30 DIAGNOSIS — R519 Headache, unspecified: Secondary | ICD-10-CM

## 2017-10-30 DIAGNOSIS — R51 Headache: Secondary | ICD-10-CM | POA: Diagnosis not present

## 2017-10-30 DIAGNOSIS — R42 Dizziness and giddiness: Secondary | ICD-10-CM | POA: Diagnosis not present

## 2017-10-30 LAB — GLUCOSE, CAPILLARY: Glucose-Capillary: 86 mg/dL (ref 65–99)

## 2017-10-30 MED ORDER — KETOROLAC TROMETHAMINE 60 MG/2ML IM SOLN
60.0000 mg | Freq: Once | INTRAMUSCULAR | Status: AC
  Administered 2017-10-30: 60 mg via INTRAMUSCULAR

## 2017-10-30 MED ORDER — MECLIZINE HCL 12.5 MG PO TABS: 12.5000 mg | ORAL_TABLET | Freq: Once | ORAL | Status: DC

## 2017-10-30 MED ORDER — MECLIZINE HCL 25 MG PO TABS: 25.0000 mg | ORAL_TABLET | Freq: Three times a day (TID) | ORAL | 0 refills | Status: DC | PRN

## 2017-10-30 MED ORDER — ONDANSETRON 4 MG PO TBDP
4.0000 mg | ORAL_TABLET | Freq: Once | ORAL | Status: AC
  Administered 2017-10-30: 4 mg via ORAL

## 2017-10-30 MED ORDER — ONDANSETRON 4 MG PO TBDP
ORAL_TABLET | ORAL | Status: AC
  Filled 2017-10-30: qty 1

## 2017-10-30 MED ORDER — KETOROLAC TROMETHAMINE 60 MG/2ML IM SOLN
INTRAMUSCULAR | Status: AC
  Filled 2017-10-30: qty 2

## 2017-10-30 NOTE — ED Triage Notes (Signed)
Pt here for headache and neck pain intermittent x 2 weeks. Reports worsening last night with sensitivity to noise. Denies N,V. Denies photophobia. Denies vision difficulty but dizzy when standing.

## 2017-10-30 NOTE — Discharge Instructions (Signed)
Push fluids to ensure adequate hydration. Ibuprofen or aleve tonight ~8p if symptoms persist. Meclizine as needed for dizziness, may cause drowsiness. If your symptoms worsen, develop increased pain, change in vision, weakness, loss of consciousness, confusion or disorientation please return to be seen or go to Ed. If no improvement in symptoms in the next 2-4 days return to be seen or follow up with your primary care provider

## 2017-10-30 NOTE — ED Provider Notes (Signed)
[place]    CSN: [id number] Arrival date & time: 10/30/17  1055     History   Chief Complaint Chief Complaint  Patient presents with  . Headache  . Neck Pain    HPI Jenny [name] is a 56 y.o. female.   Jenny [name] presents with family with complaints of posterior head and neck pain as well as dizziness. This has been intermittent for the past week. Current episode started last night. Sleep did seem to help with her headache but she states laying on her left side causes more dizziness. 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 Rates pain 10/10, states feels similar to other headaches she has had in the past. Was in the ed 4/18 with headache and dizziness. She has not taken any medications for her symptoms. Denies nausea. The pain is a pressure. Sitting improves her dizziness. Noise increases her pain. Denies vision changes. Denies head or neck injury. Denies weakness, confusion. Endorses being under a lot of stress recently at work.  Episode of diarrhea last night. She has been drinking fluids. Denies chest pain or shortness of breath. She is not on any blood thinners. Has a cancer history, is no longer on chemo.   ROS per HPI.       Past Medical History:  Diagnosis Date  . Anemia   . Cancer [facility])    colon cancer   . Enlarged thyroid     Patient Active Problem List   Diagnosis Date Noted  . Back pain 12/25/2016  . Chemotherapy-induced peripheral neuropathy ([place]) 01/06/2016  . Diabetes type 2, controlled ([place]) 12/07/2015  . Thyromegaly 12/14/2014  . Cancer of sigmoid colon metastatic to intra-abdominal lymph node ([place]) 11/14/2014  . Obesity 05/29/2009    Past Surgical History:  Procedure Laterality Date  . BOWEL RESECTION    . CERVICAL SPINE SURGERY  06/17/2012   C5-C7 ACDF  . CESAREAN SECTION    . PARTIAL HYSTERECTOMY    . TONSILLECTOMY      OB History    No data available       Home Medications    Prior to Admission medications     Medication Sig Start Date End Date Taking? Authorizing Provider  acetaminophen (TYLENOL) 500 MG tablet Take 500 mg by mouth every 6 (six) hours as needed.    [provider]  amoxicillin-clavulanate (AUGMENTIN) 875-125 MG tablet Take 1 tablet by mouth every 12 (twelve) hours. 02/01/17   Jacqualine Mau, NP  benzonatate (TESSALON) 100 MG capsule Take 1 capsule (100 mg total) by mouth every 8 (eight) hours. 02/01/17   Jacqualine Mau, NP  diphenhydrAMINE (BENADRYL) 12.5 MG/5ML liquid Take 12.5-25 mg by mouth every 4 (four) hours as needed for itching or allergies.    [provider]  fluticasone (FLONASE) 50 MCG/ACT nasal spray Place 2 sprays into both nostrils daily. 02/01/17   Jacqualine Mau, NP  gabapentin (NEURONTIN) 300 MG capsule Take 1 capsule (300 mg total) by mouth 3 (three) times daily. 06/24/17   Truitt Merle, MD  HYDROcodone-acetaminophen (NORCO/VICODIN) 5-325 MG tablet Take 1 tablet by mouth every 12 (twelve) hours as needed for moderate pain. Patient not taking: Reported on 12/25/2016 04/12/16   Truitt Merle, MD  hydrocortisone cream 0.5 % Apply 1 application topically 2 (two) times daily as needed for itching.    [provider]  loratadine (CLARITIN) 10 MG tablet Take 10 mg by mouth daily. AS NEEDED    [provider]  meclizine (ANTIVERT) 25 MG tablet Take 1 tablet (25 mg total) by mouth 3 (three) times daily as needed for dizziness. 04/03/17   Larene Pickett, PA-C  Multiple Vitamin (MULTIVITAMIN) tablet Take 1 tablet by mouth daily.    [provider]  naproxen sodium (ANAPROX) 220 MG tablet Take 1 tablet (220 mg total) by mouth 2 (two) times daily as needed. 12/25/16   Lind Covert, MD  ondansetron (ZOFRAN ODT) 4 MG disintegrating tablet Take 1 tablet (4 mg total) by mouth every 8 (eight) hours as needed for nausea. 04/03/17   Larene Pickett, PA-C  ondansetron (ZOFRAN) 8 MG tablet Take 1 by mouth twice a day for 2 days after  chemotherapy and then twice daily as needed Patient not taking: Reported on 02/13/2017 12/28/14   Truitt Merle, MD  triamcinolone (KENALOG) 0.025 % ointment Apply 1 application topically 2 (two) times daily. 12/14/14   Lind Covert, MD    Family History Family History  Problem Relation Age of Onset  . Cancer Brother   . Cancer Brother   . Hypertension Mother   . Colon cancer Neg Hx     Social History Social History   Tobacco Use  . Smoking status: Never Smoker  . Smokeless tobacco: Never Used  Substance Use Topics  . Alcohol use: No  . Drug use: No     Allergies   Other and Shellfish allergy   Review of Systems Review of Systems   Physical Exam Triage Vital Signs ED Triage Vitals  Enc Vitals Group     BP 10/30/17 1126 139/86     Pulse Rate 10/30/17 1126 60     Resp 10/30/17 1126 18     Temp 10/30/17 1126 98.3 F (36.8 C)     Temp src --      SpO2 10/30/17 1126 100 %     Weight --      Height --      Head Circumference --      Peak Flow --      Pain Score 10/30/17 1124 10     Pain Loc --      Pain Edu? --      Excl. in [place]? --    No data found.  Updated Vital Signs BP 139/86   Pulse 60   Temp 98.3 F (36.8 C)   Resp 18   SpO2 100%   Visual Acuity Right Eye Distance:   Left Eye Distance:   Bilateral Distance:    Right Eye Near:   Left Eye Near:    Bilateral Near:     Physical Exam  Constitutional: She is oriented to person, place, and time. She appears well-developed and well-nourished. No distress.  HENT:  Head: Normocephalic and atraumatic.  Left tm occluded by cerumen  Eyes: EOM are normal. Pupils are equal, round, and reactive to light. No scleral icterus. Right eye exhibits no nystagmus. Left eye exhibits no nystagmus.  Neck: Normal range of motion. No neck rigidity.  Cardiovascular: Normal rate, regular rhythm and normal heart sounds.  Pulmonary/Chest: Effort normal and breath sounds normal.  Musculoskeletal: Normal range of  motion.  Neurological: She is alert and oriented to person, place, and time. She has normal strength. She is not disoriented. No cranial nerve deficit or sensory deficit.  Dizziness with ambulation, resolves with sitting  Skin: Skin is warm and dry.     UC Treatments / Results  Labs (all labs ordered are listed, but only  abnormal results are displayed) Labs Reviewed  GLUCOSE, CAPILLARY    EKG  EKG Interpretation None       Radiology No results found.  Procedures Procedures (including critical care time)  Medications Ordered in UC Medications  ketorolac (TORADOL) injection 60 mg (not administered)  ondansetron (ZOFRAN-ODT) disintegrating tablet 4 mg (not administered)  meclizine (ANTIVERT) tablet 12.5 mg (not administered)     Initial Impression / Assessment and Plan / UC Course  I have reviewed the triage vital signs and the nursing notes.  Pertinent labs & imaging results that were available during my care of the patient were reviewed by me and considered in my medical decision making (see chart for details).  Clinical Course as of Oct 31 1339  Thu Oct 30, 2017  1340 Patient states pain has improved, rates it 8/10 and she is feeling better  [NB]    Clinical Course User Index [NB] Augusto Gamble B, NP    Without neurological findings on exam. BS 12. Without chest pain or shortness of breath, vitals stable. Presentation similar to previous headaches and episodes of vertigo, patient states. Improvement of symptoms s/p toradol and zofran in clinic, reassuring.  Rest. Push fluids. Ibuprofen or aleve tonight if symptoms persist. Meclizine as needed for dizziness, may cause drowsiness. Discussed signs to return or visit ed, with patient and family member present in room. If symptoms do not improve in the next 2-4 days to return to be seen or to follow up with PCP. Patient verbalized understanding and agreeable to plan.     Final Clinical Impressions(s) / UC Diagnoses     Final diagnoses:  Bad headache  Dizziness    ED Discharge Orders    None       Controlled Substance Prescriptions [place] Controlled Substance Registry consulted? Not Applicable   Zigmund Gottron, NP 10/30/17 1341

## 2017-12-18 ENCOUNTER — Telehealth: Payer: Self-pay | Admitting: Hematology

## 2017-12-18 NOTE — Telephone Encounter (Signed)
Call day - moved 1/2 appointments to 1/8. Not able to reach patient or leave message. Schedule mailed.

## 2017-12-24 ENCOUNTER — Other Ambulatory Visit: Payer: BC Managed Care – PPO

## 2017-12-25 ENCOUNTER — Ambulatory Visit: Payer: BC Managed Care – PPO | Admitting: Hematology

## 2017-12-29 NOTE — Progress Notes (Signed)
[name]  [phone] [phone]  Clinic Follow Up Note   Patient Care Team: Lind Covert, MD as PCP - General Juanita Craver, MD as Consulting Physician (Gastroenterology) [name], MD as Consulting Physician (General Surgery) Truitt Merle, MD as Consulting Physician (Hematology)   Date of Service:  12/30/2017  CHIEF COMPLAINTS Follow up colon cancer   DIAGNOSIS:  Sigmoid colon cancer   Staging form: Colon and Rectum, AJCC 7th Edition     Clinical: Stage IIIB (T3, N1a, M0) - Signed by Truitt Merle, MD on 12/28/2014     Cancer of sigmoid colon metastatic to intra-abdominal lymph node ([place])   10/10/2014 Imaging    CT abdomen and pelvis: Eccentric wall thickening along the sigmoid colon, worrisome for early colonic adenocarcinoma. No findings suspicious for metastatic disease. CT chest (-)         11/14/2014 Initial Diagnosis    Colon cancer      11/14/2014 Pathologic Stage    pT3pN1pMx, G2, tumor invading through the muscular propria into pericolonic fatty tissue.  LVI (-), PNI (-). 1/28 node positive, margins negative.       11/14/2014 Surgery    sigmoid colon segmental resection, margins (-).       12/28/2014 - 05/17/2015 Adjuvant Chemotherapy    [id number], 10% dose reduction due to neuropathy and cytopenia from cycle 5, oxaliplatin held from cycle 8 due to leg pain and neuropathy. chemo stopped after cycle 10 due to her tolerance issue.       12/29/2015 Imaging    CT chest, abdomen and pelvis with contrast showed no evidence of recurrence.      06/19/2017 Imaging    CT CAPw contrast IMPRESSION: No evidence recurrent or metastatic colon carcinoma.  Stable mild hepatic steatosis.  5.1 cm right thyroid lobe nodule measures larger in size compared to recent studies. Thyroid ultrasound recommended for further evaluation. This recommendation follows ACR consensus guidelines: Managing Incidental Thyroid Nodules Detected on  Imaging: White Paper of the ACR Incidental Thyroid Findings Committee. J Am Coll Radiol 2015;12(2):143-150.  Aortic atherosclerosis.      07/10/2017 Pathology Results    Thyroid Biopsy 07/10/17 Diagnosis THYROID, FINE NEEDLE ASPIRATION, RIGHT INFERIOR, RLP (SPECIMEN 1 OF 2 COLLECTED 07-10-2017) CONSISTENT WITH BENIGN FOLLICULAR NODULE (BETHESDA CATEGORY II).        HISTORY OF INITIAL PRESENTING ILLNESS:  Jenny [name] 57 y.o. female without significant past medical history, who is referred by her surgeon Dr. Marcello Moores to discuss adjuvant chemotherapy for her resected colon cancer.  She presented with abdominal pain for 8-9 month, which was related to position. She noticed bloody stool in Aug 2015. She was seen by PCP, and was referred to GI Dr. Collene Mares, and underwent colonoscopy which showed a mass at sigmoid colon (per patient, I do not have the report). Biopsy showed adenocarcinoma. She was then referred to surgeon Dr. Marcello Moores and underwent sigmoid colon segmentectomy on 11/14/2014.   TREATMENT: Surveillance  INTERIM HISTORY:  [name] returns for follow-up. She was last seen by me 6 months ago. She presents to the clinic today no new changes since her last visit. She still has significant neuropathy with some pain in her hands and legs. She notes she takes Gabapentin and ibuprofen. The pain is worse in the morning.   She is back working full time that sometimes takes a toll on her. She notes she has been constipated and tries to manage it with Miralax and drinks plenty of water and  eating fiber. She notes she recently loss her mother at 31 from cardiac complications. She reports her old port location will swell at times, but is not currently.   On review of symptoms, pt notes constipation, neuropathy and pain with no change. She notes low energy and occasional ankle swelling.     MEDICAL HISTORY:  Past Medical History:  Diagnosis Date  . Anemia   . Cancer [facility])    colon cancer   .  Enlarged thyroid     SURGICAL HISTORY: Past Surgical History:  Procedure Laterality Date  . BOWEL RESECTION    . CERVICAL SPINE SURGERY  06/17/2012   C5-C7 ACDF  . CESAREAN SECTION    . PARTIAL HYSTERECTOMY    . PORT-A-CATH REMOVAL Left 07/28/2015   Procedure: REMOVAL PORT-A-CATH;  Surgeon: [name], MD;  Location: [facility];  Service: General;  Laterality: Left;  . PORTACATH PLACEMENT Left 12/22/2014   Procedure: INSERTION PORT-A-CATH LEFT SUBCLAVIAN;  Surgeon: [name], MD;  Location: [facility];  Service: General;  Laterality: Left;  . TONSILLECTOMY      SOCIAL HISTORY: History   Social History  . Marital Status: Married    Spouse Name: N/A    Number of Children: 2  . Years of Education: N/A   Occupational History  . Not on file.   Social History Main Topics  . Smoking status: Never Smoker   . Smokeless tobacco: Never Used  . Alcohol Use: No  . Drug Use: No  . Sexual Activity: Not on file    P2G2, one child was murdered. She is a Oncologist.   FAMILY HISTORY: Family History  Problem Relation Age of Onset  . Colon cancer Neg Hx   . Liver Cancer Brother        . Hypertension Mother     ALLERGIES:  is allergic to other and shellfish allergy.  MEDICATIONS:  Current Outpatient Medications  Medication Sig Dispense Refill  . acetaminophen (TYLENOL) 500 MG tablet Take 500 mg by mouth every 6 (six) hours as needed.    . diphenhydrAMINE (BENADRYL) 12.5 MG/5ML liquid Take 12.5-25 mg by mouth every 4 (four) hours as needed for itching or allergies.    . fluticasone (FLONASE) 50 MCG/ACT nasal spray Place 2 sprays into both nostrils daily. 16 g 2  . gabapentin (NEURONTIN) 300 MG capsule Take 1 capsule (300 mg total) by mouth 3 (three) times daily. 90 capsule 5  . hydrocortisone cream 0.5 % Apply 1 application topically 2 (two) times daily as needed for itching.    . loratadine (CLARITIN) 10 MG tablet Take 10 mg by mouth daily. AS NEEDED    . meclizine  (ANTIVERT) 25 MG tablet Take 1 tablet (25 mg total) 3 (three) times daily as needed by mouth for dizziness. 30 tablet 0  . Multiple Vitamin (MULTIVITAMIN) tablet Take 1 tablet by mouth daily.    . naproxen sodium (ANAPROX) 220 MG tablet Take 1 tablet (220 mg total) by mouth 2 (two) times daily as needed. 60 tablet 1  . benzonatate (TESSALON) 100 MG capsule Take 1 capsule (100 mg total) by mouth every 8 (eight) hours. (Patient not taking: Reported on 12/30/2017) 21 capsule 0  . ondansetron (ZOFRAN ODT) 4 MG disintegrating tablet Take 1 tablet (4 mg total) by mouth every 8 (eight) hours as needed for nausea. (Patient not taking: Reported on 12/30/2017) 10 tablet 0   No current facility-administered medications for this visit.     REVIEW OF SYSTEMS:  Constitutional: Denies fevers, chills or abnormal night sweats (+) fatigue (+) full body aches  Eyes: Denies blurriness of vision, double vision or watery eyes Ears, nose, mouth, throat, and face: Denies mucositis or sore throat Respiratory: Denies cough, dyspnea or wheezes Cardiovascular: Denies palpitation, chest discomfort (+) occasional ankle swelling Gastrointestinal:  Denies nausea, heartburn or change in bowel habits  Skin: Denies abnormal skin rashes Lymphatics: Denies new lymphadenopathy or easy bruising Neurological:Denies numbness, tingling or new weaknesses (+) significant neuropathy in extremities with pain  Behavioral/Psych: Mood is stable, no new changes  All other systems were reviewed with the patient and are negative.  PHYSICAL EXAMINATION:  ECOG PERFORMANCE STATUS: 1  BP 120/75 (BP Location: Left Arm, Patient Position: Sitting)   Pulse 63   Temp 98.4 F (36.9 C) (Oral)   Resp 18   Ht 5' 5.5" (1.664 m)   Wt 255 lb 9.6 oz (115.9 kg)   SpO2 99%   BMI 41.89 kg/m    GENERAL:alert, no distress and comfortable, obese  SKIN: skin color, texture, turgor are normal, no rashes or significant lesions EYES: normal, conjunctiva are  pink and non-injected, sclera clear OROPHARYNX:no exudate, no erythema and lips, buccal mucosa, and tongue normal  NECK: supple, thyroid normal size, non-tender, without nodularity LYMPH:  no palpable lymphadenopathy in the cervical, axillary or inguinal LUNGS: clear to auscultation and percussion with normal breathing effort HEART: regular rate & rhythm and no murmurs and no lower extremity edema ABDOMEN:abdomen soft, non-tender and normal bowel sounds. Surgical wound has well-healed  Musculoskeletal:no cyanosis of digits and no clubbing  PSYCH: alert & oriented x 3 with fluent speech NEURO: no focal motor/sensory deficits  LABORATORY DATA:  I have reviewed the data as listed CBC Latest Ref Rng & Units 12/30/2017 06/18/2017 04/03/2017  WBC 3.9 - 10.3 K/uL 5.7 7.5 6.5  Hemoglobin 11.6 - 15.9 g/dL 12.2 12.8 13.1  Hematocrit 34.8 - 46.6 % 37.7 39.3 39.6  Platelets 145 - 400 K/uL 262 294 269    CMP Latest Ref Rng & Units 12/30/2017 06/18/2017 04/03/2017  Glucose 70 - 140 mg/dL 110 138 104(H)  BUN 7 - 26 mg/dL 11 14.8 13  Creatinine 0.60 - 1.10 mg/dL 0.82 0.8 0.66  Sodium 136 - 145 mmol/L 140 142 139  Potassium 3.3 - 4.7 mmol/L 3.8 4.1 3.8  Chloride 98 - 109 mmol/L 106 - 105  CO2 22 - 29 mmol/L '26 25 24  '$ Calcium 8.4 - 10.4 mg/dL 9.6 10.1 9.8  Total Protein 6.4 - 8.3 g/dL 7.4 8.0 -  Total Bilirubin 0.2 - 1.2 mg/dL 0.4 0.47 -  Alkaline Phos 40 - 150 U/L 70 66 -  AST 5 - 34 U/L 13 15 -  ALT 0 - 55 U/L 16 21 -    Results for DENELLE, CAPURRO (MRN [id number]) as of 06/25/2017 17:38  Ref. Range 11/11/2016 09:05 02/10/2017 15:44 06/18/2017 16:10  CEA Latest Ref Range: 0.0 - 4.7 ng/mL 2.1 2.3   CEA ([facility]) Latest Ref Range: 0.00 - 5.00 ng/mL <1.00 <1.00 <1.00  PENDING 12/30/17  PATHOLOGY   Thyroid Biopsy 07/10/17 Diagnosis THYROID, FINE NEEDLE ASPIRATION, RIGHT INFERIOR, RLP (SPECIMEN 1 OF 2 COLLECTED 07-10-2017) CONSISTENT WITH BENIGN FOLLICULAR NODULE (BETHESDA CATEGORY II).   Diagnosis  11/14/14 1. Colon, segmental resection for tumor, sigmoid - INVASIVE ADENOCARCINOMA, INVADING THROUGH THE MUSCULARIS PROPRIA INTO PERICOLONIC FATTY TISSUE. - ONE OF TWENTY-EIGHT LYMPH NODES, POSITIVE FOR METASTATIC CARCINOMA (1/28). - RESECTION MARGINS, NEGATIVE FOR ATYPIA OR MALIGNANCY - RESECTION  MARGINS, NEGATIVE FOR ATYPIA OR MALIGNANCY. 2. Colon, resection margin (donut), colon final distal margin - BENIGN COLONIC MUCOSA, NO EVIDENCE OF MALIGNANCY. Microscopic Comment 1. COLON Specimen: Sigmoid colon Procedure: Segmental resection Tumor site: Sigmoid colon Specimen integrity: Intact Macroscopic intactness of mesorectum: N/A Macroscopic tumor perforation: No Invasive tumor: Maximum size: 2.7 cm, gross measurement Histologic type(s): Invasive adenocarcinoma Histologic grade and differentiation: G2: moderately differentiated/low grade Type of polyp tumor arose from: Tubular adenoma Microscopic extension of invasive tumor: Invading through the muscularis propria into pericolonic fatty tissue. Lymph-Vascular invasion: Not identified Peri-neural invasion: Not identified Tumor deposit(s) (discontinuous extramural extension): N/A Resection margins: Negative Open margin: 7.1 cm Stapled margin: 5.5 cm Mesenteric margin (sigmoid and transverse): 9 cm Treatment effect (neo-adjuvant therapy): No Additional polyp(s): N/A Non-neoplastic findings: N/A Lymph nodes: number examined 28; number positive: 1 Pathologic Staging: pT3, pN1a, pMX Ancillary studies: MMR stains will be performed and an addendum report will follow. MSI testing will be performed at an outside institution and the results will be available in EPIC. (HCL:kh 11-15-14)  TUMOR MSI: stable (by PCR),  MMR: NORMAL   RADIOGRAPHIC STUDIES: I have personally reviewed the radiological images as listed and agreed with the findings in the report.  CT CAPw contrast 06/19/2017 IMPRESSION: No evidence recurrent or metastatic  colon carcinoma.  Stable mild hepatic steatosis.  5.1 cm right thyroid lobe nodule measures larger in size compared to recent studies. Thyroid ultrasound recommended for further evaluation. This recommendation follows ACR consensus guidelines: Managing Incidental Thyroid Nodules Detected on Imaging: White Paper of the ACR Incidental Thyroid Findings Committee. J Am Coll Radiol 2015;12(2):143-150.  Aortic atherosclerosis.  CT Head wo contrast 04/03/2017 - normal head CT  US Breast LTD Unilateral Left Axilla 10/09/2016 IMPRESSION: Stable adjacent likely benign masses involving the lower outer periareolar left breast, likely fibroadenomas.  CT chest, abdomen and pelvis 07/01/2016 IMPRESSION: 1. Stable exam. No new or progressive findings to suggest recurrent/metastatic disease. 2. Hepatic steatosis. 3. Stable asymmetric enlargement right thyroid lobe, consistent with underlying nodule/mass.  ASSESSMENT & PLAN:  57 y.o. African-American female, without significant past medical history, presents with abdominal pain and bloody bowel movement, anemia, was found to have a sigmoid adenocarcinoma, status post complete surgical resection.   1. Sigmoid colon adenocarcinoma, G2, pT3pN1aM0, stage IIIB, MSI stable  -the nature history of colon cancer, and high risk of cancer recurrence. IIIB disease with previously discussed and reviewed with her -She is clinically doing well, still has some residual neuropathy and body aches from chemotherapy.  -Her physical exam today was normal. I reviewed her lab results, including CEA, which are all normal. -I previously discussed her repeat his CT scan from 07/01/2016, which showed NED -we'll continue surveillance. I'll see her every 3-4 months for the first 2 years, then every 6 months afterwards for total 5 years. We will check her lab and CEA was each visit, CT scan every 6-12 months. -her colonoscopy in 10/2015 was unremarkable, done by Dr. Collene Mares     -05/2017 scan results discussed, NED. She is doing well. No evidence of cancer reoccurance -I recommend right thyroid nodule biopsy. She is agreeable. Her thyroid biopsy from 06/2017 was benign. - She is a little over 3 years out of her diagnosis now, we again discussed the risk of recurrence decreases significantly after the first 3 years. Will do one more CT scan before next visit.  -I encouraged her to continue to move around with activities given her ongoing neuropathy and energy.  -Labs reviewed, CBC  WNL, CMP and CEA still pending for today, I will call her with results.  -I encouraged her to keep up with all her cancer screenings. -F/u in 6 months with surveillance CT    2.  Peripheral neuropathy, b/l leg and body pain, secondary to chemo, G2 -overall has improved. She takes Neurontin, Tylenol or ibuprofen as needed, and Vicodin once to twice  A week -She has completed physical therapy and will continue exercise  -Due to pain reoccurance and her reluctance to taking medication, I recommend trying acupuncture or pain clinic. She will think about it.  -Her significant neuropathy and pain has not changed. She manages with gabapentin and ibuprofen.  - I again encouraged her to take Neurontin at night and try tot take less throughout the day to avoid increased fatigue  3. Obesity and  Hepatic steatosis - I encouraged her to lose some weight, by diet control and exercise -she has mild fatty liver on CT scan,  I encouraged her to follow-up with her primary care physician and check her lipid profile at least once a year. I encouraged her to lose weight.  4. Diabetes -Not on medication -Monitored by PCP      Plan  -Refill gabapentin today  -F/u in 6 months -Lab and CT CAP with contrast one week ago    All questions were answered. The patient knows to call the clinic with any problems,  questions or concerns.  I spent 20 minutes counseling the patient face to face. The total time spent  in the appointment was 25 minutes and more than 50% was on counseling.  This document serves as a record of services personally performed by Truitt Merle, MD. It was created on her behalf by Joslyn Devon, a trained medical scribe. The creation of this record is based on the scribe's personal observations and the provider's statements to them.    I have reviewed the above documentation for accuracy and completeness, and I agree with the above.     Truitt Merle, MD 12/30/2017

## 2017-12-30 ENCOUNTER — Inpatient Hospital Stay: Payer: BC Managed Care – PPO | Attending: Hematology | Admitting: Hematology

## 2017-12-30 ENCOUNTER — Inpatient Hospital Stay: Payer: BC Managed Care – PPO

## 2017-12-30 ENCOUNTER — Telehealth: Payer: Self-pay | Admitting: Hematology

## 2017-12-30 VITALS — BP 120/75 | HR 63 | Temp 98.4°F | Resp 18 | Ht 65.5 in | Wt 255.6 lb

## 2017-12-30 DIAGNOSIS — G620 Drug-induced polyneuropathy: Secondary | ICD-10-CM | POA: Diagnosis not present

## 2017-12-30 DIAGNOSIS — Z85038 Personal history of other malignant neoplasm of large intestine: Secondary | ICD-10-CM | POA: Diagnosis not present

## 2017-12-30 DIAGNOSIS — T451X5A Adverse effect of antineoplastic and immunosuppressive drugs, initial encounter: Secondary | ICD-10-CM

## 2017-12-30 DIAGNOSIS — Z808 Family history of malignant neoplasm of other organs or systems: Secondary | ICD-10-CM | POA: Insufficient documentation

## 2017-12-30 DIAGNOSIS — C772 Secondary and unspecified malignant neoplasm of intra-abdominal lymph nodes: Secondary | ICD-10-CM

## 2017-12-30 DIAGNOSIS — C187 Malignant neoplasm of sigmoid colon: Secondary | ICD-10-CM

## 2017-12-30 DIAGNOSIS — K5900 Constipation, unspecified: Secondary | ICD-10-CM

## 2017-12-30 LAB — CBC WITH DIFFERENTIAL/PLATELET
Abs Granulocyte: 2.7 10*3/uL (ref 1.5–6.5)
Basophils Absolute: 0 10*3/uL (ref 0.0–0.1)
Basophils Relative: 0 %
Eosinophils Absolute: 0.1 10*3/uL (ref 0.0–0.5)
Eosinophils Relative: 2 %
HCT: 37.7 % (ref 34.8–46.6)
Hemoglobin: 12.2 g/dL (ref 11.6–15.9)
Lymphocytes Relative: 48 %
Lymphs Abs: 2.7 10*3/uL (ref 0.9–3.3)
MCH: 27.1 pg (ref 25.1–34.0)
MCHC: 32.4 g/dL (ref 31.5–36.0)
MCV: 83.8 fL (ref 79.5–101.0)
Monocytes Absolute: 0.2 10*3/uL (ref 0.1–0.9)
Monocytes Relative: 3 %
Neutro Abs: 2.7 10*3/uL (ref 1.5–6.5)
Neutrophils Relative %: 47 %
Platelets: 262 10*3/uL (ref 145–400)
RBC: 4.5 MIL/uL (ref 3.70–5.45)
RDW: 15.5 % (ref 11.2–16.1)
WBC: 5.7 10*3/uL (ref 3.9–10.3)

## 2017-12-30 LAB — COMPREHENSIVE METABOLIC PANEL
ALT: 16 U/L (ref 0–55)
AST: 13 U/L (ref 5–34)
Albumin: 4 g/dL (ref 3.5–5.0)
Alkaline Phosphatase: 70 U/L (ref 40–150)
Anion gap: 8 (ref 3–11)
BUN: 11 mg/dL (ref 7–26)
CO2: 26 mmol/L (ref 22–29)
Calcium: 9.6 mg/dL (ref 8.4–10.4)
Chloride: 106 mmol/L (ref 98–109)
Creatinine, Ser: 0.82 mg/dL (ref 0.60–1.10)
GFR calc Af Amer: >60 mL/min (ref >60)
GFR calc non Af Amer: >60 mL/min (ref >60)
Glucose, Bld: 110 mg/dL (ref 70–140)
Potassium: 3.8 mmol/L (ref 3.3–4.7)
Sodium: 140 mmol/L (ref 136–145)
Total Bilirubin: 0.4 mg/dL (ref 0.2–1.2)
Total Protein: 7.4 g/dL (ref 6.4–8.3)

## 2017-12-30 LAB — CEA (IN HOUSE-CHCC): CEA (CHCC-In House): <1 ng/mL (ref 0.00–5.00)

## 2017-12-30 MED ORDER — GABAPENTIN 300 MG PO CAPS: 300.0000 mg | ORAL_CAPSULE | Freq: Three times a day (TID) | ORAL | 5 refills | Status: DC

## 2017-12-30 NOTE — Telephone Encounter (Signed)
Gave avs and calendar for july °

## 2017-12-31 ENCOUNTER — Encounter: Payer: Self-pay | Admitting: Hematology

## 2018-01-05 ENCOUNTER — Telehealth: Payer: Self-pay | Admitting: *Deleted

## 2018-01-05 NOTE — Telephone Encounter (Signed)
Left message for pt to call back tomorrow for good result.

## 2018-01-05 NOTE — Telephone Encounter (Signed)
-----   Message from Truitt Merle, MD sent at 01/04/2018  6:04 PM EST ----- Please let pt know her tumor marker CEA was normal on her last visit, thanks.   Truitt Merle  01/04/2018

## 2018-01-06 NOTE — Telephone Encounter (Signed)
-----   Message from Truitt Merle, MD sent at 01/04/2018  6:04 PM EST ----- Please let pt know her tumor marker CEA was normal on her last visit, thanks.   Truitt Merle  01/04/2018

## 2018-01-06 NOTE — Telephone Encounter (Signed)
Pt returned call & left message to call back.  Returned call & informed of good report of normal CEA.  She also requested a handicapped sticker due to her neuropathy.  Informed that we would ask Dr Burr Medico to sign & let her know when it is ready.  She expressed appreciation.

## 2018-01-07 ENCOUNTER — Telehealth: Payer: Self-pay | Admitting: *Deleted

## 2018-01-07 NOTE — Telephone Encounter (Signed)
Called pt and left message on voice mail re:  Temporary disability form for DMV is ready for pt to pick up.  Form left at front desk.

## 2018-01-24 IMAGING — US US THYROID BIOPSY
1 series · 13 of 15 positions shown · non-contrast
Comparison: US THYROID 07/10/2017

MEDICATIONS:
5 mL 1% lidocaine

COMPLICATIONS:
None immediate.

INDICATION: Patient with indeterminate thyroid nodules of bilateral thyroid.
Request is made for fine-needle aspiration of a right inferior
thyroid nodule and left mid thyroid nodule.

EXAM:
ULTRASOUND GUIDED FINE NEEDLE ASPIRATION OF INDETERMINATE THYROID
NODULES
TECHNIQUE: Informed written consent was obtained from the patient after a
discussion of the risks, benefits and alternatives to treatment.
Questions regarding the procedure were encouraged and answered. A
timeout was performed prior to the initiation of the procedure.

[Series 1: us thyroid biopsy · 0.09mm/px · 15 acquisitions, 13 frames shown]
[im 1/15]
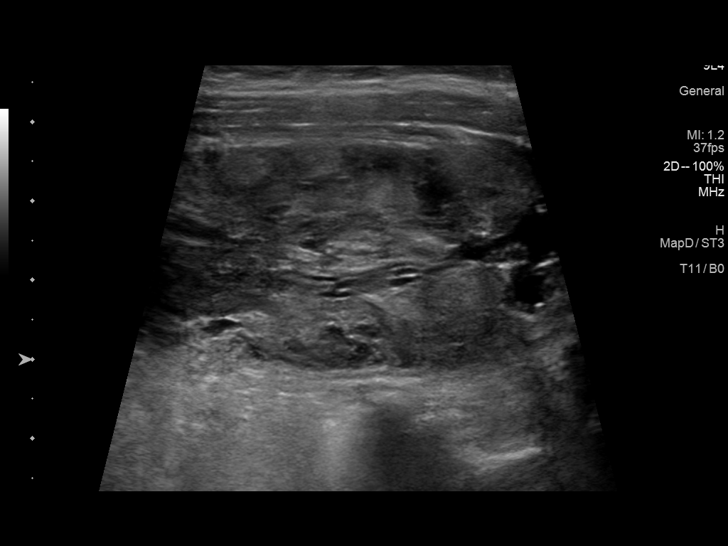
[im 2/15]
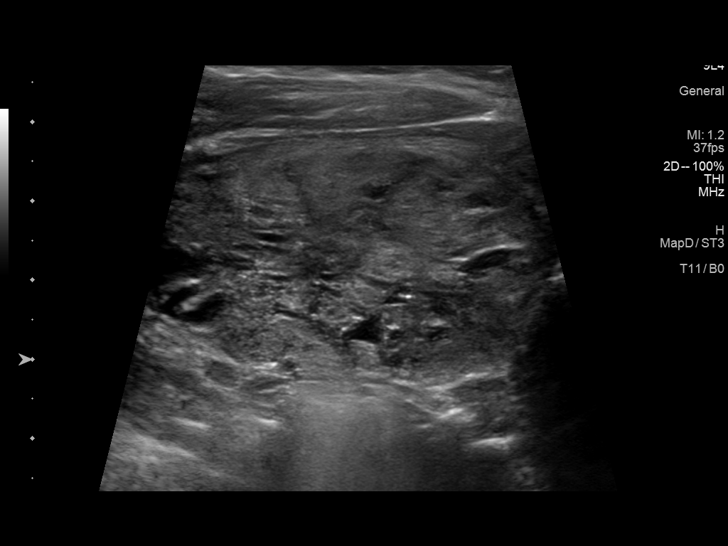
[im 3/15]
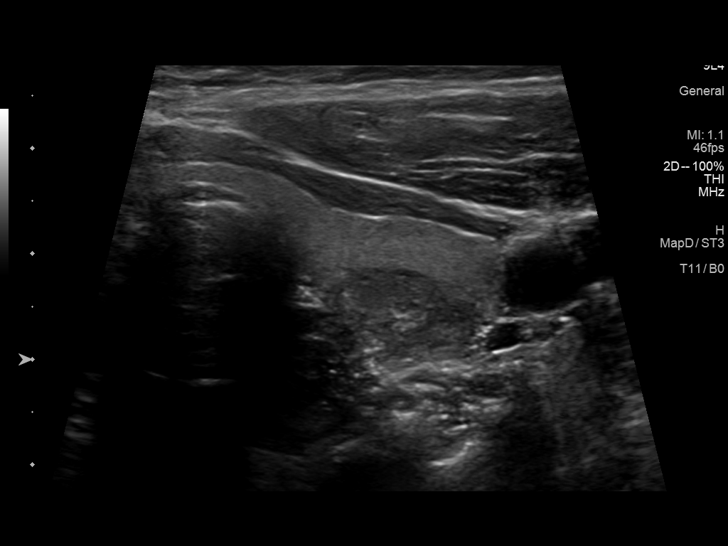
[im 5/15]
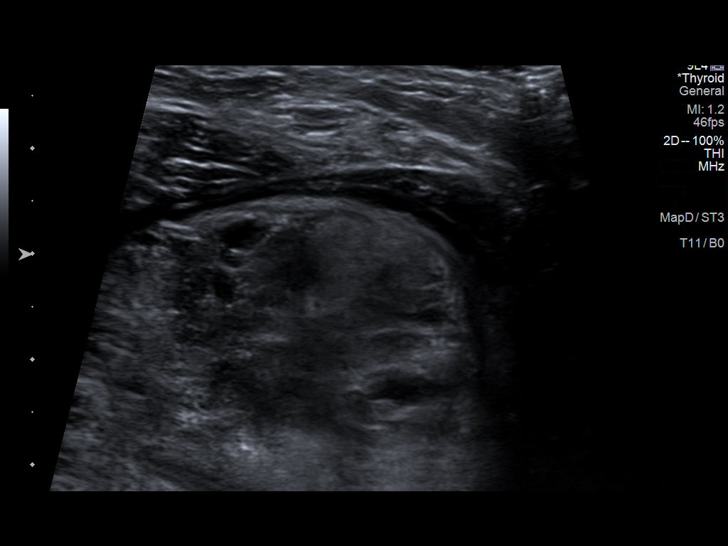
[im 6/15]
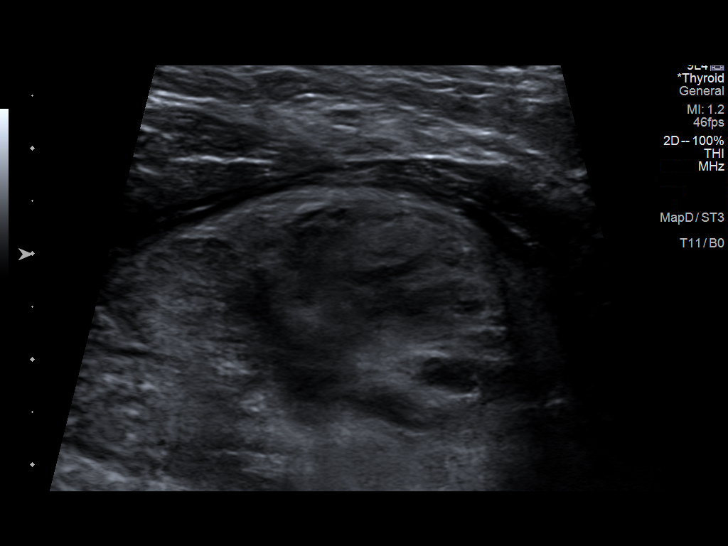
[im 7/15]
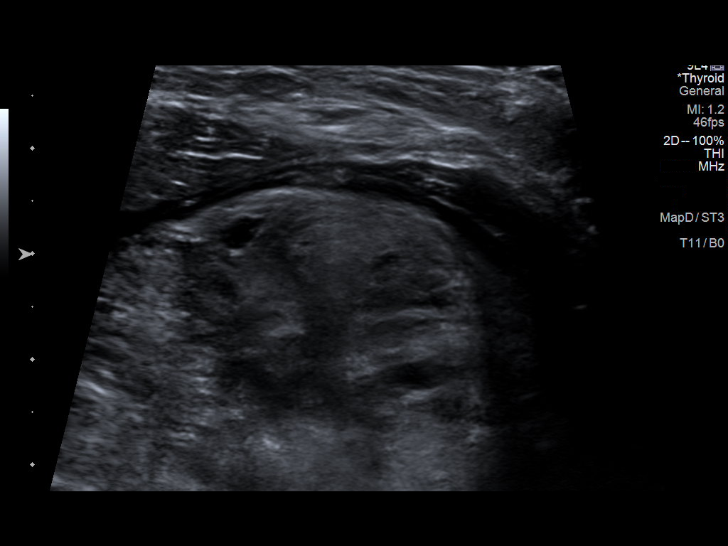
[im 8/15]
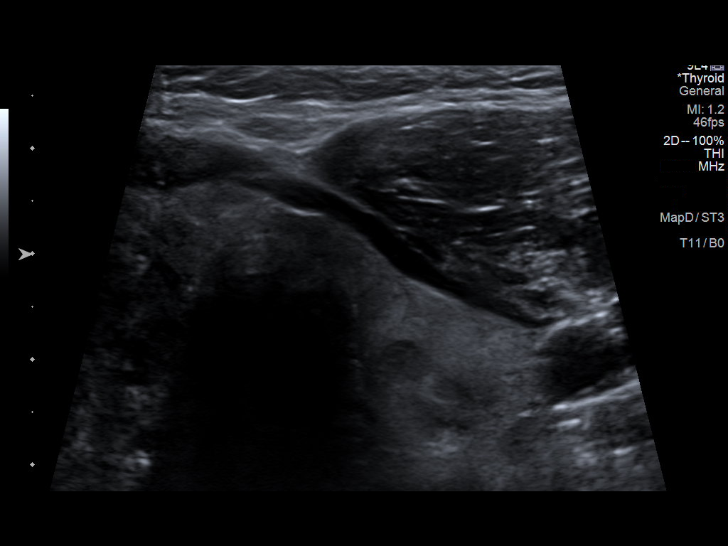
[im 9/15]
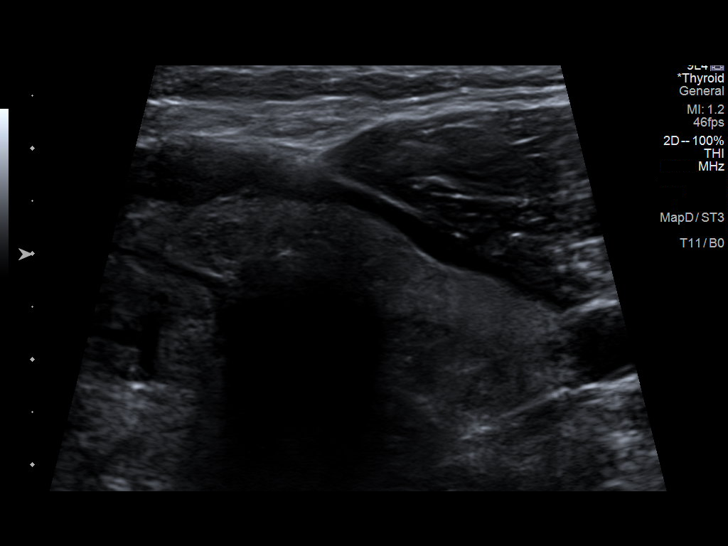
[im 10/15]
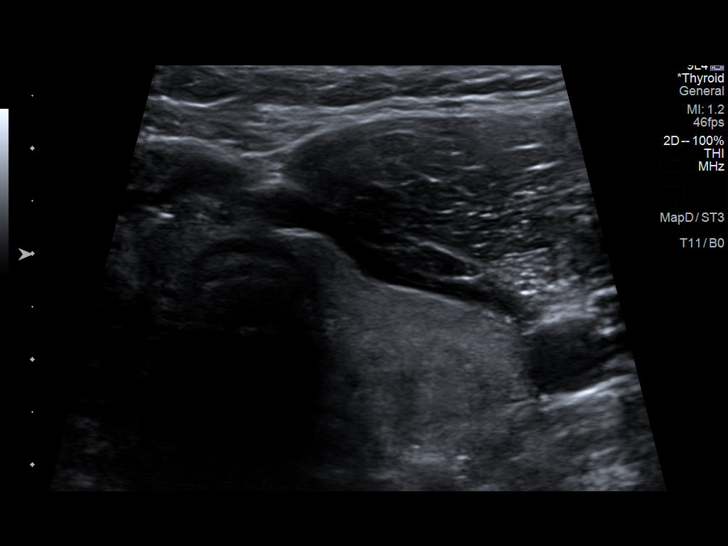
[im 11/15]
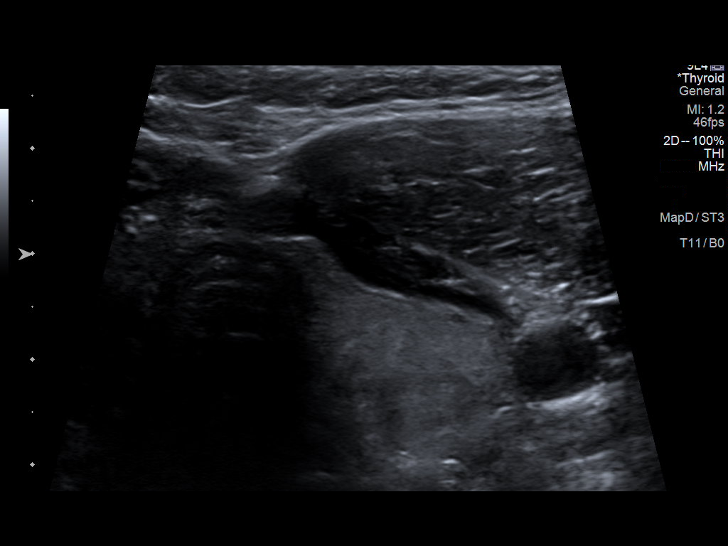
[im 13/15]
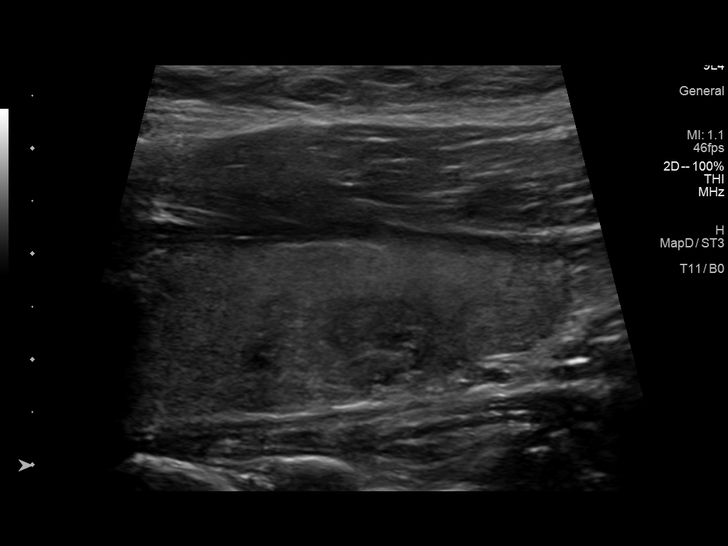
[im 14/15]
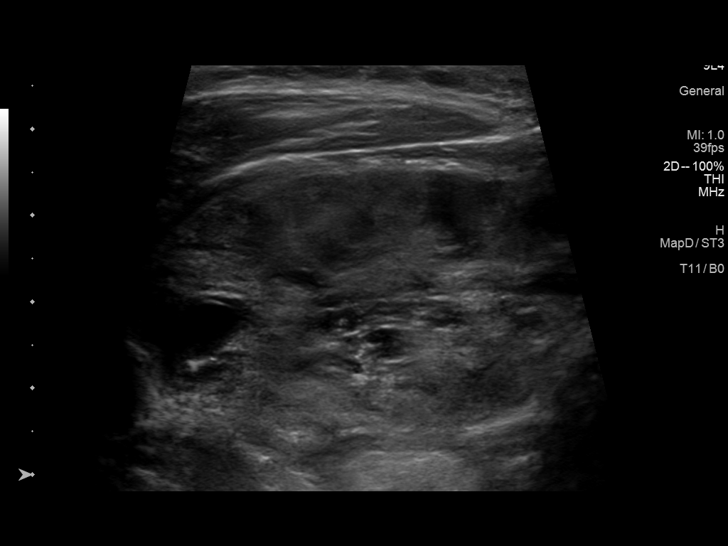
[im 15/15]
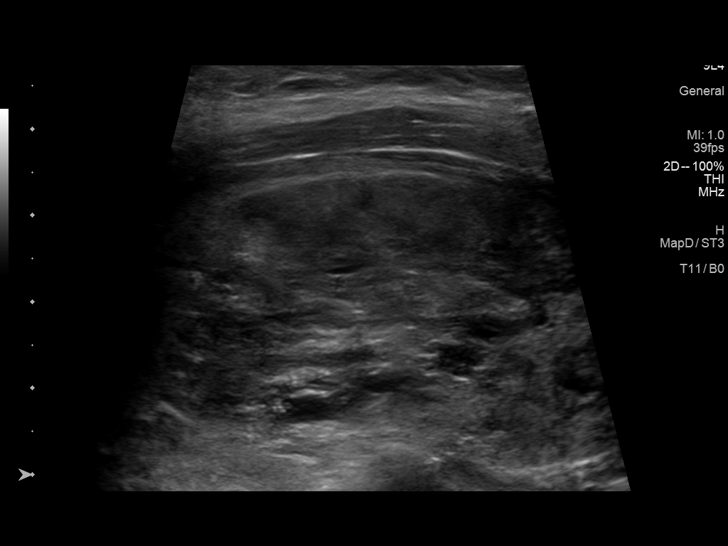

[13 of 15 positions shown; findings below may reference images not displayed]

Pre-procedural ultrasound scanning demonstrated unchanged size and
appearance of the indeterminate nodules within the right and left
thyroid.

The procedure was planned. The neck was prepped in the usual sterile
fashion, and a sterile drape was applied covering the operative
field. A timeout was performed prior to the initiation of the
procedure. Local anesthesia was provided with 1% lidocaine.

Under direct ultrasound guidance, 3 FNA biopsies were performed of
the right inferior thyroid nodule with a 25 gauge needle. Multiple
ultrasound images were saved for procedural documentation purposes.
The samples were prepared and submitted to pathology.

Under direct ultrasound guidance, 3 FNA biopsies were performed of
the left mid thyroid nodule with a 25 gauge needle. Multiple
ultrasound images were saved for procedural documentation purposes.
The samples were prepared and submitted to pathology.

Limited post procedural scanning was negative for hematoma or
additional complication. Dressings were placed. The patient
tolerated the above procedures procedure well without immediate
postprocedural complication.
FINDINGS: Nodule reference number based on prior diagnostic ultrasound: 1

Maximum size: 6.3 cm

Location: Right; Inferior

ACR TI-RADS risk category: TR3 (3 points)

Reason for biopsy: meets ACR TI-RADS criteria

_________________________________________________________

Nodule reference number based on prior diagnostic ultrasound: 2

Maximum size: 1.8 cm

Location: Left; Mid

ACR TI-RADS risk category: TR4 (4-6 points)

Reason for biopsy: meets ACR TI-RADS criteria

Ultrasound imaging confirms appropriate placement of the needles
within the thyroid nodule.
IMPRESSION: 1. Technically successful ultrasound guided fine needle aspiration
of right inferior thyroid nodule.
2. Technically successful ultrasound guided fine needle aspiration
of left mid thyroid nodule.

## 2018-02-10 ENCOUNTER — Ambulatory Visit: Payer: BC Managed Care – PPO | Admitting: Family Medicine

## 2018-02-11 ENCOUNTER — Other Ambulatory Visit: Payer: Self-pay

## 2018-02-11 ENCOUNTER — Ambulatory Visit (INDEPENDENT_AMBULATORY_CARE_PROVIDER_SITE_OTHER): Payer: BC Managed Care – PPO | Admitting: Family Medicine

## 2018-02-11 ENCOUNTER — Encounter: Payer: Self-pay | Admitting: Family Medicine

## 2018-02-11 ENCOUNTER — Encounter: Payer: Self-pay | Admitting: Licensed Clinical Social Worker

## 2018-02-11 VITALS — BP 124/88 | HR 72 | Temp 98.3°F | Ht 64.0 in | Wt 244.6 lb

## 2018-02-11 DIAGNOSIS — F439 Reaction to severe stress, unspecified: Secondary | ICD-10-CM | POA: Diagnosis not present

## 2018-02-11 DIAGNOSIS — G8929 Other chronic pain: Secondary | ICD-10-CM

## 2018-02-11 DIAGNOSIS — M545 Low back pain, unspecified: Secondary | ICD-10-CM

## 2018-02-11 DIAGNOSIS — F4323 Adjustment disorder with mixed anxiety and depressed mood: Secondary | ICD-10-CM | POA: Insufficient documentation

## 2018-02-11 MED ORDER — GABAPENTIN 300 MG PO CAPS: 600.0000 mg | ORAL_CAPSULE | Freq: Three times a day (TID) | ORAL | 0 refills | Status: DC

## 2018-02-11 MED ORDER — HYDROXYZINE HCL 25 MG PO TABS: 25.0000 mg | ORAL_TABLET | Freq: Three times a day (TID) | ORAL | 0 refills | Status: DC | PRN

## 2018-02-11 NOTE — Assessment & Plan Note (Addendum)
With tenderness over the lumbar bony spine and history of cancer, warrants imaging.  Ordered MRI lumbar spine with and without contrast.  Notably patient has a history of a cervical fusion in the past.  I spoke with the on-call radiologist who reported this should be fine for MRI imaging.  We will increase dose of gabapentin to 600 mg 3 times a day for pain control.  Want to avoid narcotics if possible given comorbid mental stress.

## 2018-02-11 NOTE — Patient Instructions (Signed)
I'm sorry you're going through all this. Wrote out of work until you see Dr. Erin Hearing on March 6. Scheduling MRI. Sent medicine to help with anxiety.  Be well, Dr. Ardelia Mems

## 2018-02-11 NOTE — Progress Notes (Addendum)
Date of Visit: 02/11/2018   HPI:  Patient presents for a same day appointment to discuss stress and pain.  Stress: Patient has a history of colon cancer which was metastatic to lymph nodes.  She is status post surgical resection and chemotherapy.  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  She was out for some time after her cancer, but started back to work in January 2018.  She successfully worked through June.  Then had summer break, and went back to school to work in August.  Over the last several days she has had marked increase in stress and now seems to have an existential crisis over not being able to keep up with work.  She reports her body is no longer able to sustain working full-time.  She is very tearful while talking about this.  On Monday she was unable to go to work due to having a "breakdown" prior to work.  Yesterday on Tuesday she did attempt to go to work but had to leave early due to being stressed out.  She denies any history of mental health issues in the past.  No family history of mental health diagnoses.  Has never taken any medication for mood or mental health issues in the past.  Denies thoughts of harming herself or others.  Patient thinks the most helpful thing for her right now would be to be out of work.  Back pain: Has had increasingly painful low back pain lately.  Also has neuropathy related to her chemotherapy.  She takes gabapentin 300 mg 3 times a day.  Believes her back pain and neuropathy are worse every time she gets more stressed out.  Ambulation is somewhat limited due to pain, denies any weakness in her legs.  No fecal incontinence.  She does endorse 6 months of occasional urinary incontinence.  Wears a pad most of the time in case she leaks. States tylenol and ibuprofen are not helping with the pain.  ROS: See HPI  [place]: thyromegaly, type 2 diabetes, metastatic colon cancer, obesity  PHYSICAL EXAM: BP 124/88  (BP Location: Left Arm, Patient Position: Sitting, Cuff Size: Large)   Pulse 72   Temp 98.3 F (36.8 C) (Oral)   Ht 5\' 4"  (1.626 m)   Wt 244 lb 9.6 oz (110.9 kg)   SpO2 97%   BMI 41.99 kg/m  Gen: tearful, appears in mental distress HEENT: normocephalic, atraumatic  Back: tenderness to palpation over bony lumbar spine Extremities: Full strength bilateral lower extremities.  Gait slowed but normal. Psych: sad/tearful affect, well groomed, talkative about stressors, decreased eye contact  ASSESSMENT/PLAN:  Stress Patient quite distressed today.  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  As such I am going to write her out of work until she can follow-up with her PCP who knows her better.  Hopefully this time out of work will be therapeutic for her.  Appointment scheduled for PCP's earliest next available appointment on March 6.  For stress/anxiety will prescribe hydroxyzine.  No SI/HI. Warm handoff done to [place] for support.  Back pain With tenderness over the lumbar bony spine and history of cancer, warrants imaging.  Ordered MRI lumbar spine with and without contrast.  Notably patient has a history of a cervical fusion in the past.  I spoke with the on-call radiologist who reported this should be fine for MRI  imaging.  We will increase dose of gabapentin to 600 mg 3 times a day for pain control.  Want to avoid narcotics if possible given comorbid mental stress.   FOLLOW UP: Follow up in 2 weeks with PCP for above issues.  [place]. Ardelia Mems, [place]

## 2018-02-11 NOTE — Assessment & Plan Note (Addendum)
Patient quite distressed today.  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  As such I am going to write her out of work until she can follow-up with her PCP who knows her better.  Hopefully this time out of work will be therapeutic for her.  Appointment scheduled for PCP's earliest next available appointment on March 6.  For stress/anxiety will prescribe hydroxyzine.  No SI/HI. Warm handoff done to [place] for support.

## 2018-02-11 NOTE — Progress Notes (Signed)
ESTIMATE TIME:30 minutes Type of Service: Integrated Behavioral Health warm handoff  Interpreter:No.   SUBJECTIVE: Jenny [name] is a 57 y.o. female referred by Dr. Ardelia Mems for: symptoms of  anxiety a well as work related stressors.  Patient is soft spoken, pleasant and tearful at times. Patient reports difficulty with the demands and changes of her job, stress of not being able to keep up with the children, chronic pain, feeling tired, forgetting things, difficulty with sitting and standing up. Patient recently transitioned back to work after Cancer treatment.  States she thought she could return and do her job but realizes her body has changed and things are different.  Duration of problem: since she return to work in 2018 Impact: not able to think, becomes overwhelmed, not able to do the things she did in the past  Current /Hx of mental health treatment & substance use: none  LIFE CONTEXT:  [place] lives with husband of 6 years ,and 3 grandchildren  School / Work /Fun: works full time as Pharmacist, hospital, strong faith and active in her church   Life changes: unable to manage the stress of  work, home life and caring for grandchildren GOALS: Patient will reduce symptoms of: stress, and increase  ability TT:CNGFRE skills, self-management skills and stress reduction, Increase healthy adjustment to current life circumstances. INTERVENTION:  Solution-Focused Strategies and Supportive Counseling, Reflective listening, Behavioral Therapy (Relaxed breathing), Psychoeducation, community resources.    ISSUES DISCUSSED: Integrated care services, support system, previous and current coping skills, [place] community programs , things patient enjoy or use to enjoy doing, faith support, finding her new normal, what patient would like and discussed options.    ASSESSMENT:Patient currently experiencing symptoms of stress.  Symptoms exacerbated by patient's new limitations as she is begining to  recognize she has a new normal and not at her previous level of functioning. Patient may benefit from, assessment and brief therapeutic interventions to assist with managing this process.  Patient is not open to ongoing therapy at this time.    PLAN:   1.Patient will F/U with LCSW is needed  2. Patient declined F/U phone call  3. Behavioral recommendations: relaxed breathing,   4. Referral: [place] "Finding Your New Normal" support group   Warm Hand Off Completed.     [name], LCSW Licensed Clinical Social Worker [facility]   [phone] 2:08 PM

## 2018-02-12 ENCOUNTER — Telehealth: Payer: Self-pay | Admitting: *Deleted

## 2018-02-12 NOTE — Telephone Encounter (Signed)
-----   Message from Leeanne Rio, MD sent at 02/11/2018  4:37 PM EST ----- Patient needs MRI scheduled. Can you schedule & call her with appointment? Thanks! Leeanne Rio, MD

## 2018-02-12 NOTE — Telephone Encounter (Signed)
Attempted to call pt, no answer. LMOVM with appt time/location. Deseree Kennon Holter, CMA

## 2018-02-23 ENCOUNTER — Ambulatory Visit (HOSPITAL_COMMUNITY): Payer: BC Managed Care – PPO

## 2018-02-25 ENCOUNTER — Ambulatory Visit: Payer: BC Managed Care – PPO | Admitting: Family Medicine

## 2018-02-25 ENCOUNTER — Encounter: Payer: Self-pay | Admitting: Family Medicine

## 2018-02-25 VITALS — BP 99/60 | HR 56 | Temp 98.0°F | Wt 252.6 lb

## 2018-02-25 DIAGNOSIS — Z23 Encounter for immunization: Secondary | ICD-10-CM | POA: Diagnosis not present

## 2018-02-25 DIAGNOSIS — F411 Generalized anxiety disorder: Secondary | ICD-10-CM | POA: Diagnosis not present

## 2018-02-25 DIAGNOSIS — E119 Type 2 diabetes mellitus without complications: Secondary | ICD-10-CM

## 2018-02-25 LAB — GLUCOSE, POCT (MANUAL RESULT ENTRY): POC Glucose: 123 mg/dl — AB (ref 70–99)

## 2018-02-25 LAB — POCT GLYCOSYLATED HEMOGLOBIN (HGB A1C): Hemoglobin A1C: 7.1

## 2018-02-25 MED ORDER — SERTRALINE HCL 50 MG PO TABS: 50.0000 mg | ORAL_TABLET | Freq: Every day | ORAL | 0 refills | Status: DC

## 2018-02-25 NOTE — Patient Instructions (Addendum)
Good to see you today  For the Anxiety  -  will check blood tests and talk with you when you come back   - Debra or her partner will call you in the next few days to set up a counselor   - Start sertraline take 1/2 pill each morning for 2 days then one pill every morning   - If you feel a lot worse call us immediately  Come back in one week

## 2018-02-25 NOTE — Assessment & Plan Note (Signed)
I think she best fits anxiety with some depressive symptoms.     She reluctantly agrees to counseling and medication.  Will start low dose sertraline and ask IC to call her to arrange longer term counseling.  Will check labs to rule out thyroid and electrolyte problems given her recent medical history of cancer and thyromegaly  I stated she should not work for another week and I would fill out her paperwork and leave up front

## 2018-02-25 NOTE — Progress Notes (Signed)
Subjective  Patient is presenting with the following illnesses  Stress:  Feels better than at last visit.  Still feels anxious about going back to work and being observed.   She has had performance anxiety in past but never been on medications or seen a counselor.   Never been treated for depression.  No manic episodes.   Having trouble with sleep and particularly feeling bad that she is a failure.  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  Reluctant to consider medications or counseling No alchohol.  Faith is a strength for her  Past Stressors Brother committed suicide in 2014 Son was killed in 2007  DIABETES Disease Monitoring: Blood Sugar ranges(Severity) -not checking  Associated Symptoms- Polyuria/phagia/dipsia- no      Visual problems- no Medications: Compliance(Modifying factor) - watching diet  Hypoglycemic symptoms- no Timing - continuous  Monitoring Labs and Parameters Last A1C:  Lab Results  Component Value Date   HGBA1C 7.1 02/25/2018   Last Lipid:     Component Value Date/Time   CHOL 150 12/25/2016 1157   HDL 41 (L) 12/25/2016 1157   Last Bmet  Potassium  Date Value Ref Range Status  12/30/2017 3.8 3.3 - 4.7 mmol/L Final  06/18/2017 4.1 3.5 - 5.1 mEq/L Final   Sodium  Date Value Ref Range Status  12/30/2017 140 136 - 145 mmol/L Final  06/18/2017 142 136 - 145 mEq/L Final   Creatinine  Date Value Ref Range Status  06/18/2017 0.8 0.6 - 1.1 mg/dL Final   Creatinine, Ser  Date Value Ref Range Status  12/30/2017 0.82 0.60 - 1.10 mg/dL Final         Chief Complaint noted Review of Symptoms - see HPI PMH - Smoking status noted.     Objective Vital Signs reviewed Psych:  Cognition and judgment appear intact. Alert, communicative  and cooperative with normal attention span and concentration. No apparent delusions, illusions, hallucinations PHQ9 - 14-16.  No suicidal ideation     Assessments/Plans  Diabetes type 2,  controlled ([place]) Controlled with diet  Anxiety state I think she best fits anxiety with some depressive symptoms.     She reluctantly agrees to counseling and medication.  Will start low dose sertraline and ask IC to call her to arrange longer term counseling.  Will check labs to rule out thyroid and electrolyte problems given her recent medical history of cancer and thyromegaly  I stated she should not work for another week and I would fill out her paperwork and leave up front     See after visit summary for details of patient instuctions

## 2018-02-25 NOTE — Assessment & Plan Note (Signed)
Controlled with diet 

## 2018-02-26 ENCOUNTER — Telehealth: Payer: Self-pay | Admitting: Licensed Clinical Social Worker

## 2018-02-26 LAB — CMP14+EGFR
ALT: 16 IU/L (ref 0–32)
AST: 12 IU/L (ref 0–40)
Albumin/Globulin Ratio: 1.9 (ref 1.2–2.2)
Albumin: 5 g/dL (ref 3.5–5.5)
Alkaline Phosphatase: 66 IU/L (ref 39–117)
BUN/Creatinine Ratio: 18 (ref 9–23)
BUN: 12 mg/dL (ref 6–24)
Bilirubin Total: 0.4 mg/dL (ref 0.0–1.2)
CO2: 22 mmol/L (ref 20–29)
Calcium: 9.7 mg/dL (ref 8.7–10.2)
Chloride: 105 mmol/L (ref 96–106)
Creatinine, Ser: 0.67 mg/dL (ref 0.57–1.00)
GFR calc Af Amer: 115 mL/min/{1.73_m2} (ref >59)
GFR calc non Af Amer: 100 mL/min/{1.73_m2} (ref >59)
Globulin, Total: 2.6 g/dL (ref 1.5–4.5)
Glucose: 114 mg/dL — ABNORMAL HIGH (ref 65–99)
Potassium: 4.3 mmol/L (ref 3.5–5.2)
Sodium: 144 mmol/L (ref 134–144)
Total Protein: 7.6 g/dL (ref 6.0–8.5)

## 2018-02-26 LAB — CBC
Hematocrit: 38.1 % (ref 34.0–46.6)
Hemoglobin: 12.3 g/dL (ref 11.1–15.9)
MCH: 26.1 pg — ABNORMAL LOW (ref 26.6–33.0)
MCHC: 32.3 g/dL (ref 31.5–35.7)
MCV: 81 fL (ref 79–97)
Platelets: 284 10*3/uL (ref 150–379)
RBC: 4.71 x10E6/uL (ref 3.77–5.28)
RDW: 16.1 % — ABNORMAL HIGH (ref 12.3–15.4)
WBC: 4.9 10*3/uL (ref 3.4–10.8)

## 2018-02-26 LAB — TSH: TSH: 1.07 u[IU]/mL (ref 0.450–4.500)

## 2018-02-26 NOTE — Progress Notes (Signed)
Service : [place] F/U Call   LCSW received in-basket consult from PCP. F/U call to patient to schedule [facility] F/U visit.  Left voice message to call LCSW. Plan: LCSW will wait for return call, if no return call is received, LCSW will meet with patient before or after office visit with PCP next week.  [name], LCSW Licensed Clinical Social Worker [place]   [phone] 11:53 AM

## 2018-03-03 ENCOUNTER — Ambulatory Visit (INDEPENDENT_AMBULATORY_CARE_PROVIDER_SITE_OTHER): Payer: BC Managed Care – PPO | Admitting: Family Medicine

## 2018-03-03 ENCOUNTER — Encounter: Payer: Self-pay | Admitting: Licensed Clinical Social Worker

## 2018-03-03 ENCOUNTER — Other Ambulatory Visit: Payer: Self-pay

## 2018-03-03 ENCOUNTER — Encounter: Payer: Self-pay | Admitting: Family Medicine

## 2018-03-03 DIAGNOSIS — F411 Generalized anxiety disorder: Secondary | ICD-10-CM

## 2018-03-03 DIAGNOSIS — E010 Iodine-deficiency related diffuse (endemic) goiter: Secondary | ICD-10-CM | POA: Diagnosis not present

## 2018-03-03 NOTE — Progress Notes (Signed)
Type of Service: [place] 1st F/U Visit Total time:35 minutes :  Interpreter:No.    Reason for follow-up: Continue brief intervention to assist patient with managing symptoms of stress and feeling anxious . Reports  difficulty in daily functions. Patient is pleasant, smiling and engaged in conversation.   Appearance:Well Groomed ; Thought process: Coherent; Affect: Appropriate. Patient refused GAD and PHQ. Patient is making progress towards her goal.  States she is starting to feel better. Believes being out our work has helped.  She will return to work this Friday. Patient did not take psychotropic medication prescribed by PCP, states she was concerns about possible side effects.( pt states she discussed concerns with PCP) Goals: Patient will reduce symptoms of: feeling anxious , and increase  ability LS:LHTDSK skills, self-management skills and stress reduction, . Intervention: Solution-Focused Strategies and Sleep Hygiene, Reflective listening, Behavioral Therapy (Relaxed breathing), Psychoeducation   Issues discussed: sleep hygiene; pacing ;  Work/life balance ; education on relaxed breathing; transition plan back to work; organization with home and work; identifying stressors.  Assessment:Patient continues to experience symptoms of stress and feeling anxious.  Symptoms exacerbated by the stress of balancing work and home. Patient is feeling better since being out of work.   Patient may benefit from, and is in agreement to continue further assessment and brief therapeutic interventions to assist with managing her symptoms.  Plan: 1. Patient will F/U with LCSW in one week 2. Behavioral recommendations: relaxed breathing and sleep Hygiene 3. Walking and medication. 4. Will also utilize Pacing   [name], LCSW Licensed Clinical Social Worker [facility]   [phone] 2:05 PM

## 2018-03-03 NOTE — Progress Notes (Signed)
Subjective  Patient is presenting with the following illnesses  ANXIETY FOLLOW UP She feels things are some better.  Trying to have a better sleep schedule.  Thinking about exercising by walking again.  Did not take sertraline was concerned about the side effects.  Did take atarax a few times - made her sleepy.  Feels she is about ready to try work again but is anxious about work load and performance.    No suicidal ideation    THYROID No weight change or heat intolerance.  Swallowing well  Chief Complaint noted Review of Symptoms - see HPI PMH - Smoking status noted.     Objective Vital Signs reviewed Psych:  Cognition and judgment appear intact. Alert, communicative  and cooperative with normal attention span and concentration. No apparent delusions, illusions, hallucinations    Assessments/Plans  Anxiety state Stable.  See after visit summary.  Cautiously agreed she might be ready to slowly restart work.  Discussed importance of healthy behaviors and when to consider medication and limitations and side effects   Thyromegaly Biopsy in July 2018 was normal.  Recent TSH wnl.     See after visit summary for details of patient instuctions

## 2018-03-03 NOTE — Patient Instructions (Addendum)
Good to see you today!  Thanks for coming in.  Don't take any mood medicines at the moment   Come up with a regular sleep wake schedule getting about 7-8 hours of sleep a day.  If you nap do so for no more than 20 minutes   Walk for 5-10 minutes every day on a regular schedule   Come back in one week   Go back to work on Friday Aim to work about 50% time for the next 1-2 weeks

## 2018-03-04 ENCOUNTER — Encounter: Payer: Self-pay | Admitting: Family Medicine

## 2018-03-04 NOTE — Assessment & Plan Note (Signed)
Biopsy in July 2018 was normal.  Recent TSH wnl.

## 2018-03-04 NOTE — Assessment & Plan Note (Signed)
Stable.  See after visit summary.  Cautiously agreed she might be ready to slowly restart work.  Discussed importance of healthy behaviors and when to consider medication and limitations and side effects

## 2018-03-10 ENCOUNTER — Ambulatory Visit (INDEPENDENT_AMBULATORY_CARE_PROVIDER_SITE_OTHER): Payer: BC Managed Care – PPO | Admitting: Licensed Clinical Social Worker

## 2018-03-10 ENCOUNTER — Encounter: Payer: Self-pay | Admitting: Family Medicine

## 2018-03-10 ENCOUNTER — Other Ambulatory Visit: Payer: Self-pay

## 2018-03-10 ENCOUNTER — Ambulatory Visit: Payer: BC Managed Care – PPO | Admitting: Family Medicine

## 2018-03-10 ENCOUNTER — Other Ambulatory Visit: Payer: Self-pay | Admitting: Family Medicine

## 2018-03-10 DIAGNOSIS — F411 Generalized anxiety disorder: Secondary | ICD-10-CM | POA: Diagnosis not present

## 2018-03-10 DIAGNOSIS — F439 Reaction to severe stress, unspecified: Secondary | ICD-10-CM

## 2018-03-10 NOTE — Progress Notes (Signed)
Type of Service: [place] 2dn F/U Visit Total time:40 minutes :  Interpreter:No.    Reason for follow-up: Continue brief intervention to assist patient with managing symptoms of anxiety and depression, as well as work related stressors . Patient is pleasant and engaged in conversation.   Appearance:Well Groomed ; Thought process: Coherent; Affect: Appropriate.   Patient is making progress towards goal.  States overall she is starting to feel better but is concerned about transitioning back to work. She is doing relaxed breathing but states she needs to do it more. Patient noticed she has started to do things around the house and spending less time in bed. Feels her life is getting organized.  She continues to have racing thoughts at night which makes if difficult to go to sleep. Patient did not start taking medication nor has she returned to work as of today.  Goals: Patient will reduce symptoms of: anxiety, depression and stress , and increase  ability WH:QPRFFM skills, self-management skills and stress reduction.  Intervention: Solution-Focused Strategies and Supportive Counseling, Reflective listening, Behavioral Therapy (Relaxed breathing), Problem-solving teaching/coping strategies and Psychoeducation.   Issues discussed: organization to transition back to work  ; support from family ;  Concerns about taking mood medication; managing stress at home and interventions that have worked in the past ; concerns about being able to manage work stress and home stress, options for managing both, benefits of both therapy and psychotropic medication. Assessment:Patient continues to experience symptoms with sleep difficulties, racing thoughts, irritability, difficult doing things, lack of focus and managing daily stressors.  Patient may benefit from, and is in agreement to continue further assessment and brief therapeutic interventions to assist with managing her symptoms.  She will also  discuss medication concerns with PCP.  Plan: 1. Patient will F/U with LCSW in one week 2. Behavioral recommendations: continue relaxed breathing; 3. Referral: , none at this time  [name], LCSW Licensed Clinical Social Worker [place]   [phone] 11:51 AM

## 2018-03-10 NOTE — Patient Instructions (Signed)
Good to see you today!  Thanks for coming in.  Your Plans   See Neoma Laming in one week   Plan to go back to work 2 weeks from today   If not steadily progressing then will consider starting medication   Continue to work on   Sleep wake same time every day. Back on work schedule   Go for a walk every day and when you feel stressed   See me in two - three weeks

## 2018-03-10 NOTE — Progress Notes (Signed)
Subjective  Patient is presenting with the following illnesses  ANXIETY Feels is some better.  Did not start medication but is trying to stick to a better sleep wake schedule and exercise.  No suicidal ideation as feels calmer and better able to deal with stressful situations.  Her job does not have part time.  She feels she will be ready to return in 2 weeks if continues current trajectory    Chief Complaint noted Review of Symptoms - see HPI PMH - Smoking status noted.     Objective Vital Signs reviewed Psych:  Cognition and judgment appear intact. Alert, communicative  and cooperative with normal attention span and concentration. No apparent delusions, illusions, hallucinations   Assessments/Plans  Anxiety state Some improvement but not at goal.  See after visit summary and IC note.      See after visit summary for details of patient instuctions

## 2018-03-11 ENCOUNTER — Encounter: Payer: Self-pay | Admitting: Family Medicine

## 2018-03-11 NOTE — Assessment & Plan Note (Signed)
Some improvement but not at goal.  See after visit summary and IC note.

## 2018-03-13 ENCOUNTER — Telehealth: Payer: Self-pay | Admitting: Licensed Clinical Social Worker

## 2018-03-13 NOTE — Progress Notes (Signed)
Type of Service: Clinical Social Work Consult  LCSW called patient to reschedule appointment 7/47/18 due to conflict with prior scheduled meeting.  Left patient a voice message that LCSW could see her 8:30 or 1:30 the same day or patient could reschedule for another day in that week.  [name], LCSW Licensed Clinical Social Worker [facility]   [phone] 2:38 PM

## 2018-03-17 ENCOUNTER — Ambulatory Visit (INDEPENDENT_AMBULATORY_CARE_PROVIDER_SITE_OTHER): Payer: BC Managed Care – PPO | Admitting: Licensed Clinical Social Worker

## 2018-03-17 DIAGNOSIS — F411 Generalized anxiety disorder: Secondary | ICD-10-CM

## 2018-03-17 NOTE — Progress Notes (Signed)
Type of Service: Integrated Behavioral Health 3rd F/U Visit Total time:40 minutes :  Interpreter:No.    Reason for follow-up: Continue brief intervention to assist patient with managing symptoms of depression and anxiety, as well as stressors . Reports somewhat difficulty in daily functions. Patient is pleasant , smiling and engaged in conversation.   Appearance:Neat ; Thought process: Coherent; Affect: Appropriate   Depression screen [facility] 2/9 03/17/2018 03/03/2018 02/25/2018  Decreased Interest 1 (No Data) 2  Down, Depressed, Hopeless 1 - 2  PHQ - 2 Score 2 - 4  Altered sleeping 2 - 2  Tired, decreased energy 1 - 2  Change in appetite 1 - 2  Feeling bad or failure about yourself  1 - 2  Trouble concentrating 2 - 2  Moving slowly or fidgety/restless 1 - 2  Suicidal thoughts 0 - 0  PHQ-9 Score 10 - 16  Difficult doing work/chores Somewhat difficult - -   Patient is making progress towards goal.  States she is feeling better. This is also evident by PHQ-9 score above. Patient continues to utilize relaxed breathing and is also walking several times a week.  She continues to work on organizing area of her life and states this is the key to being successful with balancing work and home. Goals: Patient will reduce symptoms of: depression and anxiety, and increase  ability VO:HYWVPX skills and self-management skills. Intervention: Solution-Focused Strategies and Behavioral Activation, Reflective listening, , Screening Tool(s)  Administered, Problem-solving teaching/coping strategies, Psychoeducation and Supportive Counseling   Issues discussed: plan for transition back to work  ; children helping more ;  Organization, utilization of calendar ; Trial run with kids this wk with returning to work ; time for self and sleep hygiene.  Assessment:Patient continues to experience symptoms of depression. Patient is making progress as she prepares to transition back to work. Sleep continues to be a concern.  Patient  may benefit from, and is in agreement to continue further assessment and brief therapeutic interventions to assist with managing her symptoms.  Plan: 1. Patient will F/U with LCSW in one week 2. Behavioral recommendations: continue relaxed breathing, and walking 3. Referral: none at this time. 4. Patient will implement organization and planning tools discussed  [name], LCSW Licensed Clinical Social Worker [place]   [phone] 9:43 AM

## 2018-03-19 ENCOUNTER — Other Ambulatory Visit: Payer: Self-pay | Admitting: *Deleted

## 2018-03-19 DIAGNOSIS — F411 Generalized anxiety disorder: Secondary | ICD-10-CM

## 2018-03-19 MED ORDER — SERTRALINE HCL 50 MG PO TABS: 50.0000 mg | ORAL_TABLET | Freq: Every day | ORAL | 0 refills | Status: DC

## 2018-03-25 ENCOUNTER — Other Ambulatory Visit: Payer: Self-pay

## 2018-03-25 ENCOUNTER — Ambulatory Visit (INDEPENDENT_AMBULATORY_CARE_PROVIDER_SITE_OTHER): Payer: BC Managed Care – PPO | Admitting: Licensed Clinical Social Worker

## 2018-03-25 DIAGNOSIS — F411 Generalized anxiety disorder: Secondary | ICD-10-CM

## 2018-03-25 MED ORDER — GABAPENTIN 300 MG PO CAPS: 600.0000 mg | ORAL_CAPSULE | Freq: Three times a day (TID) | ORAL | 0 refills | Status: DC

## 2018-03-25 NOTE — Telephone Encounter (Signed)
Request 90 day supply.  Alisa Brake, RN ([facility] RN)  

## 2018-03-26 NOTE — Progress Notes (Signed)
Type of Service: Integrated Behavioral Health 4th F/U Visit Total time:30 minutes :  Interpreter:No.    Reason for follow-up: Continue brief intervention to assist patient with managing symptoms of anxiety, as well as managing stressors . Reports  minimal difficulty in daily functions. Patient is pleasant, tired but engaged in conversation.  Current stress is her transition back to work and maintaining work / [employer]. Patient thinks her first few days at work were ok. Reports being very tired when she comes home and always thinking and feeling like she did something wrong at work.  Screening not provided by front desk. Patient does not like completing GAD and PHQ. Patient believes she is making progress towards goal.  States overall she is feeling better.She walks 4 days a week and continues to utilize relaxed breathing when needed. Has implemented organization tools discussed, made schedule at home to help with flow and organization as well as family assisting with chores.   Goals: Patient will reduce symptoms of: anxiety , and increase  ability IW:OEHOZY skills, self-management skills and stress reduction. Intervention: Solution-Focused Strategies, Brief CBT and Supportive Counseling, Reflective listening, Psychoeducation   Issues discussed: coping skills ; reviewed organization tools and how working ;  Thoughts and feelings; Automatic negative Thoughts ; changing thoughts to change feelings. Assessment:Patient continues to experience symptoms of anxiety.  Symptoms exacerbated by recent return to work and negative thoughts which causes her to feel nervous .  Patient may benefit from, and is in agreement to continue further assessment and brief therapeutic interventions to assist with managing her sympotms.  Plan: 1. Patient will F/U with LCSW in 2 weeks 2. Behavioral recommendations: continue relaxed breathing,Review Improve How You Feel work sheet; recognize negative thinking and replace with  new positive thought  3. Referral:  None at this time  [name], LCSW Licensed Clinical Social Worker [facility]   [phone]

## 2018-04-03 ENCOUNTER — Other Ambulatory Visit: Payer: Self-pay

## 2018-04-03 DIAGNOSIS — F411 Generalized anxiety disorder: Secondary | ICD-10-CM

## 2018-04-07 ENCOUNTER — Telehealth: Payer: Self-pay | Admitting: Licensed Clinical Social Worker

## 2018-04-07 NOTE — Telephone Encounter (Signed)
Called patient to find out if she was still taking Zoloft since she requested it. Patient informed me that she had not been taking it but recently she has needed it because she is having breakdowns at work and elsewhere. She is in the need of something to help her get over her issues. She has an appointment on 04/14/2018. She says that she really needs to see [facility] before then and she would love it if she could be called by [facility] .Ozella Almond, CMA

## 2018-04-07 NOTE — Progress Notes (Signed)
Type of Service: Clinical Social Work  LCSW received voice message from patient ref. concerns with transition back to work not going well.  Asked LCSW to call. LCSW returned phone call. unable to leave a message.  Patient has an appointment with PCP April 23rd and scheduled with [facility] for [facility] on Monday 29th.   LCSW received return call from patient to schedule appointment with LCSW.  Patient is stressed with transition back to work.  She will call front office to see if she is able to see PCP sooner than scheduled appointment next week.  Plan: LCSW will meet with patient Thursday at 3:30.  [name], LCSW Licensed Clinical Social Worker [facility]   [phone] 3:45 PM

## 2018-04-08 MED ORDER — SERTRALINE HCL 50 MG PO TABS: 50.0000 mg | ORAL_TABLET | Freq: Every day | ORAL | 0 refills | Status: DC

## 2018-04-08 NOTE — Addendum Note (Signed)
Addended by: Talbert Cage L on: 04/08/2018 05:20 PM   Modules accepted: Orders

## 2018-04-08 NOTE — Telephone Encounter (Signed)
Not doing well  Very anxious at school.  Crying episodes.  Was written up by the principal.    Would like to start taking Zoloft  Will try to work tomorrow  Has an appointment to see Ms Laurance Flatten tomorrow.  Asked to take 1/2 tab of zoloft for two morning then a whole tablet  If shortness of breath or chest pain that does not resolve with calming down should call EMS she agrees  Has appointment to see me next week

## 2018-04-09 ENCOUNTER — Ambulatory Visit: Payer: BC Managed Care – PPO | Admitting: Licensed Clinical Social Worker

## 2018-04-09 DIAGNOSIS — F411 Generalized anxiety disorder: Secondary | ICD-10-CM

## 2018-04-09 NOTE — Progress Notes (Signed)
Type of Service: Integrated Behavioral Health 5th F/U Visit Total time:50 minutes :  Interpreter:No.    Reason for follow-up: Continue brief intervention to assist patient with managing symptoms of anxiety and depression,  Reports  difficulty in daily functions at work and home. Patient is pleasant and tired during office visit today.   Appearance:Well Groomed ; Thought process: Coherent; Affect: Appropriate and Tearful. No SI Goals: Patient will reduce symptoms of: anxiety, depression and stress , and increase  ability QI:WLNLGX skills, self-management skills and stress reduction. Intervention: Motivational Interviewing, Solution-Focused Strategies and Supportive Counseling, Reflective listening,  Problem-solving teaching/coping strategies and Psychoeducation   Issces discussed:  Benefits of taking mood medication  ; coping skills for work and home ;  Managing symptoms; reservations with medication ; support system; walking.   Assessment:Patient continues to experience symptoms of depression and anxiety.  She has reservations about starting zoloft but has agreed to start the medication.  Patient having difficulty doing work duties due to symptoms of depression.  Patient may benefit from, and is in agreement to continue further assessment and brief therapeutic interventions to assist with managing her symptoms.  Plan: 1. Patient will F/U with LCSW in 2 weeks 2. Behavioral recommendations: relaxed breathing; taking zoloft as per PCP 3. Referral: none at this time  [name], LCSW Licensed Clinical Social Worker [place]   [phone] 4:30 PM

## 2018-04-14 ENCOUNTER — Ambulatory Visit: Payer: BC Managed Care – PPO | Admitting: Family Medicine

## 2018-04-14 ENCOUNTER — Encounter: Payer: Self-pay | Admitting: Family Medicine

## 2018-04-14 DIAGNOSIS — F4323 Adjustment disorder with mixed anxiety and depressed mood: Secondary | ICD-10-CM

## 2018-04-14 NOTE — Patient Instructions (Signed)
Good to see you today!  Thanks for coming in.  Goals  For the medicine take it before bed.  I would not expect it to help much for about 3 weeks  See Neoma Laming regularly - make an appointment to see her  Open the blinds before 12 noon  Walk for 1 minute daily  Talk with your oncologist  Come back 2 weeks

## 2018-04-14 NOTE — Assessment & Plan Note (Addendum)
Worsened.  She is now taking medication regularly and seeing her counselor regularly.  Should not work for at least 3 weeks - letter given

## 2018-04-14 NOTE — Progress Notes (Signed)
Subjective  Jenny [name] is a 56 y.o. 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  Had trouble functioning was very nervous.  Was written up by principal.  Patient does not feel she is safe or effective to work   Is Exercising no stopped walking does not want to leave the house much  Evidence of suicidal ideation: no  Medication Monitoring Compliance: taking as prescribed.  Just started 4 days ago.  Feels it makes her sleepy Decreased Libido no change      Lightheadedness no     Insomnia unchanged  GI symptoms no  ROS - See HPI  PMH Previous treatment includes: none   Chief Complaint noted Review of Symptoms - see HPI PMH - Smoking status noted.    Objective Vital Signs reviewed BP 116/78 (BP Location: Left Arm, Patient Position: Sitting, Cuff Size: Normal)   Pulse 62   Temp 98.5 F (36.9 C) (Oral)   Ht 5\' 4"  (1.626 m)   Wt 245 lb 9.6 oz (111.4 kg)   SpO2 97%   BMI 42.16 kg/m  Psych:  Cognition and judgment appear intact. Sad communicative  and cooperative with normal attention span and concentration. No apparent delusions, illusions, hallucinations Does not wish to take PHQ-9  No suicidal ideation  Assessments/Plans  See after visit summary for details of patient instuctions  Adjustment disorder with mixed anxiety and depressed mood Worsened.  She is now taking medication regularly and seeing her counselor regularly.  Should not work for at least 3 weeks - letter given

## 2018-04-17 ENCOUNTER — Telehealth: Payer: Self-pay | Admitting: Hematology

## 2018-04-17 NOTE — Telephone Encounter (Signed)
Patient called about scheduling per her primary care before july

## 2018-04-20 ENCOUNTER — Ambulatory Visit: Payer: BC Managed Care – PPO

## 2018-04-21 ENCOUNTER — Ambulatory Visit: Payer: BC Managed Care – PPO | Admitting: Licensed Clinical Social Worker

## 2018-04-21 DIAGNOSIS — F4323 Adjustment disorder with mixed anxiety and depressed mood: Secondary | ICD-10-CM

## 2018-04-21 NOTE — Progress Notes (Signed)
Type of Service: Integrated Behavioral Health 6th F/U Visit Total time:30 minutes :  Interpreter:No.    Reason for follow-up: Continue brief intervention to assist patient with managing symptoms of anxiety and depression, as well as work related stressors . Reports  difficulty in daily functions, is currently temp out of work due to note from PCP. Patient is engaged in conversation.   Appearance:Neat ; Thought process: Coherent; Affect: Depressed and Tearful at times.  Denies SI, reports brief thoughts but no plan and will not act on thoughts.    Patient declined screening for GAD or PHQ States she feels tired and does not feel like doing anything. Spends most of her time in her room.  Patient reports not wanting to feel this way and wants to get better.  She continues to take medication as prescribed by PCP however she has not been able to go for a walk nor has she opened the blinds to let in the sun.  Goals: Patient will reduce symptoms of: anxiety, depression and stress , and increase  ability EP:PIRJJO skills, self-management skills and stress reduction. Intervention: Motivational Interviewing, Solution-Focused Strategies and Supportive Counseling,  Psychoeducation   Issues discussed: cycle of depression ; discussed support system ; her negative thoughts and how to manage them; described a typical day ; reviewed 10 little things to do ; reviewed importance of opening blinds and other task from PCP; journal /writing down thoughts.  Assessment:Patient continues to experience symptoms of depression. She presents tired and feeling down today with very little energy. Patient has not been able to implement interventions previously however is willing to try.   Patient may benefit from, and is in agreement to continue further assessment and brief therapeutic interventions to assist with managing her symptoms.  Plan: 1. Patient will F/U with LCSW in one week. 2. Behavioral recommendations: Behavior Activation  (10 little things to Do) 3. Referral: none at this time  [name], LCSW Licensed Clinical Social Worker [place]   [phone] 2:01 PM

## 2018-04-22 NOTE — Progress Notes (Signed)
[place]  [phone] Fax:([phone]) [phone]  Clinic Follow Up Note   Patient Care Team: Lind Covert, MD as PCP - General Juanita Craver, MD as Consulting Physician (Gastroenterology) [name], MD as Consulting Physician (General Surgery) Truitt Merle, MD as Consulting Physician (Hematology)   Date of Service:  04/23/2018  CHIEF COMPLAINTS Worsening peripheral neuropathy  DIAGNOSIS:  Sigmoid colon cancer   Staging form: Colon and Rectum, AJCC 7th Edition     Clinical: Stage IIIB (T3, N1a, M0) - Signed by Truitt Merle, MD on 12/28/2014     Cancer of sigmoid colon metastatic to intra-abdominal lymph node ([place])   10/10/2014 Imaging    CT abdomen and pelvis: Eccentric wall thickening along the sigmoid colon, worrisome for early colonic adenocarcinoma. No findings suspicious for metastatic disease. CT chest (-)         11/14/2014 Initial Diagnosis    Colon cancer      11/14/2014 Pathologic Stage    pT3pN1pMx, G2, tumor invading through the muscular propria into pericolonic fatty tissue.  LVI (-), PNI (-). 1/28 node positive, margins negative.       11/14/2014 Surgery    sigmoid colon segmental resection, margins (-).       12/28/2014 - 05/17/2015 Adjuvant Chemotherapy    [id number], 10% dose reduction due to neuropathy and cytopenia from cycle 5, oxaliplatin held from cycle 8 due to leg pain and neuropathy. chemo stopped after cycle 10 due to her tolerance issue.       12/29/2015 Imaging    CT chest, abdomen and pelvis with contrast showed no evidence of recurrence.      06/19/2017 Imaging    CT CAPw contrast IMPRESSION: No evidence recurrent or metastatic colon carcinoma.  Stable mild hepatic steatosis.  5.1 cm right thyroid lobe nodule measures larger in size compared to recent studies. Thyroid ultrasound recommended for further evaluation. This recommendation follows ACR consensus guidelines: Managing Incidental Thyroid Nodules  Detected on Imaging: White Paper of the ACR Incidental Thyroid Findings Committee. J Am Coll Radiol 2015;12(2):143-150.  Aortic atherosclerosis.      07/10/2017 Pathology Results    Thyroid Biopsy 07/10/17 Diagnosis THYROID, FINE NEEDLE ASPIRATION, RIGHT INFERIOR, RLP (SPECIMEN 1 OF 2 COLLECTED 07-10-2017) CONSISTENT WITH BENIGN FOLLICULAR NODULE (BETHESDA CATEGORY II).        HISTORY OF INITIAL PRESENTING ILLNESS:  Jenny [name] 57 y.o. female without significant past medical history, who is referred by her surgeon Dr. Marcello Moores to discuss adjuvant chemotherapy for her resected colon cancer.  She presented with abdominal pain for 8-9 month, which was related to position. She noticed bloody stool in Aug 2015. She was seen by PCP, and was referred to GI Dr. Collene Mares, and underwent colonoscopy which showed a mass at sigmoid colon (per patient, I do not have the report). Biopsy showed adenocarcinoma. She was then referred to surgeon Dr. Marcello Moores and underwent sigmoid colon segmentectomy on 11/14/2014.   TREATMENT: Surveillance  INTERIM HISTORY:  Jenny [name] returns for follow-up and she presents to the office by herself. She was last seen by me on 12/30/2017.  She requested an earlier follow-up appointment to discuss her worsening neuropathy.  She notes that she has been doing bad since going back to work. She was recently written out of work due to her inability to keep up due to pain. She has tried gabapentin with no relief of her symptoms. She was placed on zoloft to aid with her memory by her PCP.   On  review of systems, pt reports BLE pain, bilateral hand pain, forgetfulness, increased neuropathy, fatigue, lack of energy, depression. She denies any other symptoms.     MEDICAL HISTORY:  Past Medical History:  Diagnosis Date  . Anemia   . Cancer [facility])    colon cancer   . Enlarged thyroid     SURGICAL HISTORY: Past Surgical History:  Procedure Laterality Date  . BOWEL RESECTION    .  CERVICAL SPINE SURGERY  06/17/2012   C5-C7 ACDF  . CESAREAN SECTION    . PARTIAL HYSTERECTOMY    . PORT-A-CATH REMOVAL Left 07/28/2015   Procedure: REMOVAL PORT-A-CATH;  Surgeon: [name], MD;  Location: [facility];  Service: General;  Laterality: Left;  . PORTACATH PLACEMENT Left 12/22/2014   Procedure: INSERTION PORT-A-CATH LEFT SUBCLAVIAN;  Surgeon: [name], MD;  Location: [facility];  Service: General;  Laterality: Left;  . TONSILLECTOMY      SOCIAL HISTORY: History   Social History  . Marital Status: Married    Spouse Name: N/A    Number of Children: 2  . Years of Education: N/A   Occupational History  . Not on file.   Social History Main Topics  . Smoking status: Never Smoker   . Smokeless tobacco: Never Used  . Alcohol Use: No  . Drug Use: No  . Sexual Activity: Not on file    P2G2, one child was murdered. She is a Oncologist.   FAMILY HISTORY: Family History  Problem Relation Age of Onset  . Colon cancer Neg Hx   . Liver Cancer Brother        . Hypertension Mother     ALLERGIES:  is allergic to other and shellfish allergy.  MEDICATIONS:  Current Outpatient Medications  Medication Sig Dispense Refill  . acetaminophen (TYLENOL) 500 MG tablet Take 500 mg by mouth every 6 (six) hours as needed.    . diphenhydrAMINE (BENADRYL) 12.5 MG/5ML liquid Take 12.5-25 mg by mouth every 4 (four) hours as needed for itching or allergies.    . fluticasone (FLONASE) 50 MCG/ACT nasal spray Place 2 sprays into both nostrils daily. 16 g 2  . hydrocortisone cream 0.5 % Apply 1 application topically 2 (two) times daily as needed for itching.    . hydrOXYzine (ATARAX/VISTARIL) 25 MG tablet Take 1 tablet (25 mg total) by mouth 3 (three) times daily as needed for anxiety. 30 tablet 0  . loratadine (CLARITIN) 10 MG tablet Take 10 mg by mouth daily. AS NEEDED    . meclizine (ANTIVERT) 25 MG tablet Take 1 tablet (25 mg total) 3 (three) times daily as needed by mouth for  dizziness. 30 tablet 0  . Multiple Vitamin (MULTIVITAMIN) tablet Take 1 tablet by mouth daily.    . naproxen sodium (ANAPROX) 220 MG tablet Take 1 tablet (220 mg total) by mouth 2 (two) times daily as needed. 60 tablet 1  . sertraline (ZOLOFT) 50 MG tablet Take 1 tablet (50 mg total) by mouth daily. 30 tablet 0  . DULoxetine (CYMBALTA) 60 MG capsule Take 1 capsule (60 mg total) by mouth daily. 30 capsule 1   No current facility-administered medications for this visit.     REVIEW OF SYSTEMS:   Constitutional: Denies fevers, chills or abnormal night sweats (+) fatigue (+) full body aches  Eyes: Denies blurriness of vision, double vision or watery eyes Ears, nose, mouth, throat, and face: Denies mucositis or sore throat Respiratory: Denies cough, dyspnea or wheezes Cardiovascular: Denies palpitation, chest discomfort (+)  occasional ankle swelling Gastrointestinal:  Denies nausea, heartburn or change in bowel habits  Skin: Denies abnormal skin rashes Lymphatics: Denies new lymphadenopathy or easy bruising Neurological:Denies numbness, tingling or new weaknesses (+) significant neuropathy in extremities with pain  Behavioral/Psych: Mood is stable, no new changes  All other systems were reviewed with the patient and are negative.  PHYSICAL EXAMINATION:  ECOG PERFORMANCE STATUS: 2  BP 125/70 (BP Location: Right Arm, Patient Position: Sitting)   Pulse 64   Temp 97.7 F (36.5 C) (Oral)   Resp 20   Ht '5\' 4"'$  (1.626 m)   Wt 247 lb 14.4 oz (112.4 kg)   SpO2 99%   BMI 42.55 kg/m    GENERAL:alert, no distress and comfortable, obese  SKIN: skin color, texture, turgor are normal, no rashes or significant lesions EYES: normal, conjunctiva are pink and non-injected, sclera clear OROPHARYNX:no exudate, no erythema and lips, buccal mucosa, and tongue normal  NECK: supple, thyroid normal size, non-tender, without nodularity LYMPH:  no palpable lymphadenopathy in the cervical, axillary or  inguinal LUNGS: clear to auscultation and percussion with normal breathing effort HEART: regular rate & rhythm and no murmurs and no lower extremity edema ABDOMEN:abdomen soft, non-tender and normal bowel sounds. Surgical wound has well-healed  Musculoskeletal:no cyanosis of digits and no clubbing  PSYCH: alert & oriented x 3 with fluent speech NEURO: no focal motor/sensory deficits, moderate decreased vibration sensation on her hands and feet  LABORATORY DATA:  I have reviewed the data as listed CBC Latest Ref Rng & Units 02/25/2018 12/30/2017 06/18/2017  WBC 3.4 - 10.8 x10E3/uL 4.9 5.7 7.5  Hemoglobin 11.1 - 15.9 g/dL 12.3 12.2 12.8  Hematocrit 34.0 - 46.6 % 38.1 37.7 39.3  Platelets 150 - 379 x10E3/uL 284 262 294    CMP Latest Ref Rng & Units 02/25/2018 12/30/2017 06/18/2017  Glucose 65 - 99 mg/dL 114(H) 110 138  BUN 6 - 24 mg/dL 12 11 14.8  Creatinine 0.57 - 1.00 mg/dL 0.67 0.82 0.8  Sodium 134 - 144 mmol/L 144 140 142  Potassium 3.5 - 5.2 mmol/L 4.3 3.8 4.1  Chloride 96 - 106 mmol/L 105 106 -  CO2 20 - 29 mmol/L '22 26 25  '$ Calcium 8.7 - 10.2 mg/dL 9.7 9.6 10.1  Total Protein 6.0 - 8.5 g/dL 7.6 7.4 8.0  Total Bilirubin 0.0 - 1.2 mg/dL 0.4 0.4 0.47  Alkaline Phos 39 - 117 IU/L 66 70 66  AST 0 - 40 IU/L '12 13 15  '$ ALT 0 - 32 IU/L '16 16 21    '$ Results for EWA, HIPP (MRN [id number]) as of 06/25/2017 17:38  Ref. Range 11/11/2016 09:05 02/10/2017 15:44 06/18/2017 16:10 12/30/2017 13:43  CEA Latest Ref Range: 0.0 - 4.7 ng/mL 2.1 2.3    CEA ([facility]) Latest Ref Range: 0.00 - 5.00 ng/mL <1.00 <1.00 <1.00 <1.00    PATHOLOGY   Thyroid Biopsy 07/10/17 Diagnosis THYROID, FINE NEEDLE ASPIRATION, RIGHT INFERIOR, RLP (SPECIMEN 1 OF 2 COLLECTED 07-10-2017) CONSISTENT WITH BENIGN FOLLICULAR NODULE (BETHESDA CATEGORY II).   Diagnosis 11/14/14 1. Colon, segmental resection for tumor, sigmoid - INVASIVE ADENOCARCINOMA, INVADING THROUGH THE MUSCULARIS PROPRIA INTO PERICOLONIC FATTY TISSUE. -  ONE OF TWENTY-EIGHT LYMPH NODES, POSITIVE FOR METASTATIC CARCINOMA (1/28). - RESECTION MARGINS, NEGATIVE FOR ATYPIA OR MALIGNANCY - RESECTION MARGINS, NEGATIVE FOR ATYPIA OR MALIGNANCY. 2. Colon, resection margin (donut), colon final distal margin - BENIGN COLONIC MUCOSA, NO EVIDENCE OF MALIGNANCY. Microscopic Comment 1. COLON Specimen: Sigmoid colon Procedure: Segmental resection Tumor site: Sigmoid  colon Specimen integrity: Intact Macroscopic intactness of mesorectum: N/A Macroscopic tumor perforation: No Invasive tumor: Maximum size: 2.7 cm, gross measurement Histologic type(s): Invasive adenocarcinoma Histologic grade and differentiation: G2: moderately differentiated/low grade Type of polyp tumor arose from: Tubular adenoma Microscopic extension of invasive tumor: Invading through the muscularis propria into pericolonic fatty tissue. Lymph-Vascular invasion: Not identified Peri-neural invasion: Not identified Tumor deposit(s) (discontinuous extramural extension): N/A Resection margins: Negative Open margin: 7.1 cm Stapled margin: 5.5 cm Mesenteric margin (sigmoid and transverse): 9 cm Treatment effect (neo-adjuvant therapy): No Additional polyp(s): N/A Non-neoplastic findings: N/A Lymph nodes: number examined 28; number positive: 1 Pathologic Staging: pT3, pN1a, pMX Ancillary studies: MMR stains will be performed and an addendum report will follow. MSI testing will be performed at an outside institution and the results will be available in EPIC. (HCL:kh 11-15-14)  TUMOR MSI: stable (by PCR),  MMR: NORMAL   RADIOGRAPHIC STUDIES: I have personally reviewed the radiological images as listed and agreed with the findings in the report.  CT CAPw contrast 06/19/2017 IMPRESSION: No evidence recurrent or metastatic colon carcinoma.  Stable mild hepatic steatosis.  5.1 cm right thyroid lobe nodule measures larger in size compared to recent studies. Thyroid ultrasound  recommended for further evaluation. This recommendation follows ACR consensus guidelines: Managing Incidental Thyroid Nodules Detected on Imaging: White Paper of the ACR Incidental Thyroid Findings Committee. J Am Coll Radiol 2015;12(2):143-150.  Aortic atherosclerosis.  CT Head wo contrast 04/03/2017 - normal head CT  US Breast LTD Unilateral Left Axilla 10/09/2016 IMPRESSION: Stable adjacent likely benign masses involving the lower outer periareolar left breast, likely fibroadenomas.  CT chest, abdomen and pelvis 07/01/2016 IMPRESSION: 1. Stable exam. No new or progressive findings to suggest recurrent/metastatic disease. 2. Hepatic steatosis. 3. Stable asymmetric enlargement right thyroid lobe, consistent with underlying nodule/mass.  ASSESSMENT & PLAN:  57 y.o. African-American female, without significant past medical history, presents with abdominal pain and bloody bowel movement, anemia, was found to have a sigmoid adenocarcinoma, status post complete surgical resection.    1.  Worsening peripheral neuropathy, b/l leg and body pain, secondary to chemo, G2-3 -She has developed significant peripheral neuropathy after chemotherapy, with bilateral leg and body pain, numbness and tingling of her fingers and toes. She takes Neurontin, Tylenol or ibuprofen as needed, she was previously on Vicodin once to twice  A week, off now -She has previously completed physical therapy and will continue exercise  -Her neuropathy has significantly gotten worse since she returned to work, she is not able to perform her job.  I wrote a letter for her to be off work for the next 2 months. -Due to pain reoccurrence and her reluctance to taking medication, I recommend trying acupuncture or pain clinic. She will think about it.  --Patient to discontinue gabapentin and I will start her on cymbalta.  -I will refer the patient back to physical therapy for dry needling procedure, she agrees. -Continue B12  and vitamin D supplement  2. Sigmoid colon adenocarcinoma, G2, pT3pN1aM0, stage IIIB, MSI stable  -the nature history of colon cancer, and high risk of cancer recurrence. IIIB disease with previously discussed and reviewed with her -She is clinically doing well, still has some residual neuropathy and body aches from chemotherapy.  -Her physical exam today was normal. I reviewed her lab results, including CEA, which are all normal. -I previously discussed her repeat his CT scan from 07/01/2016, which showed NED -we'll continue surveillance. I'll see her every 3-4 months for the first 2  years, then every 6 months afterwards for total 5 years. We will check her lab and CEA was each visit, CT scan every 6-12 months. -her colonoscopy in 10/2015 was unremarkable, done by Dr. Collene Mares   -05/2017 scan results discussed, NED. She is doing well. No evidence of cancer reoccurance -I previously recommended right thyroid nodule biopsy. She is agreeable. Her thyroid biopsy from 06/2017 was benign. - She is a little over 3 years out of her diagnosis now, we again discussed the risk of recurrence decreases significantly after the first 3 years. Will do one more CT scan before next visit.  -I previously encouraged her to continue to move around with activities given her ongoing neuropathy and energy.  -Labs reviewed, CBC WNL, CMP and CEA still pending for today, I will call her with results.  -I previously encouraged her to keep up with all her cancer screenings. -PT referral  -CT CAP w contrast on 7/1   3. Obesity and  Hepatic steatosis - I previously encouraged her to lose some weight, by diet control and exercise -she has mild fatty liver on CT scan,  I encouraged her to follow-up with her primary care physician and check her lipid profile at least once a year. I encouraged her to lose weight.  4. Diabetes -Not on medication -Monitored by PCP  5. Depression -Continue on zoloft as recently prescribed by  PCP -She was referred to see a psychiatrist recently, she will maintain follow up with therapist weekly as noted by her PCP      Plan  -Will prescribe cymbalta today, and stop Neurontin -PT referral for worsening peripheral neuropathy -Lab and CT CAP w contrast on 7/1, follow-up on July 9 -I wrote a letter for her to be off work for the next 2 months, will help her FMLA paperwork if needed   All questions were answered. The patient knows to call the clinic with any problems,  questions or concerns.  I spent 20 minutes counseling the patient face to face. The total time spent in the appointment was 25 minutes and more than 50% was on counseling.  This document serves as a record of services personally performed by Truitt Merle, MD. It was created on her behalf by Steva Colder, a trained medical scribe. The creation of this record is based on the scribe's personal observations and the provider's statements to them.   I have reviewed the above documentation for accuracy and completeness, and I agree with the above.    Truitt Merle, MD 04/23/2018

## 2018-04-23 ENCOUNTER — Telehealth: Payer: Self-pay | Admitting: Family Medicine

## 2018-04-23 ENCOUNTER — Inpatient Hospital Stay: Payer: BC Managed Care – PPO | Attending: Hematology | Admitting: Hematology

## 2018-04-23 ENCOUNTER — Encounter: Payer: Self-pay | Admitting: Hematology

## 2018-04-23 ENCOUNTER — Telehealth: Payer: Self-pay | Admitting: Hematology

## 2018-04-23 VITALS — BP 125/70 | HR 64 | Temp 97.7°F | Resp 20 | Ht 64.0 in | Wt 247.9 lb

## 2018-04-23 DIAGNOSIS — Z79899 Other long term (current) drug therapy: Secondary | ICD-10-CM | POA: Insufficient documentation

## 2018-04-23 DIAGNOSIS — C187 Malignant neoplasm of sigmoid colon: Secondary | ICD-10-CM

## 2018-04-23 DIAGNOSIS — F329 Major depressive disorder, single episode, unspecified: Secondary | ICD-10-CM | POA: Diagnosis not present

## 2018-04-23 DIAGNOSIS — Z800 Family history of malignant neoplasm of digestive organs: Secondary | ICD-10-CM | POA: Diagnosis not present

## 2018-04-23 DIAGNOSIS — E669 Obesity, unspecified: Secondary | ICD-10-CM | POA: Diagnosis not present

## 2018-04-23 DIAGNOSIS — T451X5A Adverse effect of antineoplastic and immunosuppressive drugs, initial encounter: Secondary | ICD-10-CM

## 2018-04-23 DIAGNOSIS — Z8249 Family history of ischemic heart disease and other diseases of the circulatory system: Secondary | ICD-10-CM

## 2018-04-23 DIAGNOSIS — Z9221 Personal history of antineoplastic chemotherapy: Secondary | ICD-10-CM | POA: Insufficient documentation

## 2018-04-23 DIAGNOSIS — Z85038 Personal history of other malignant neoplasm of large intestine: Secondary | ICD-10-CM | POA: Diagnosis present

## 2018-04-23 DIAGNOSIS — C772 Secondary and unspecified malignant neoplasm of intra-abdominal lymph nodes: Secondary | ICD-10-CM

## 2018-04-23 DIAGNOSIS — K760 Fatty (change of) liver, not elsewhere classified: Secondary | ICD-10-CM | POA: Diagnosis not present

## 2018-04-23 DIAGNOSIS — G620 Drug-induced polyneuropathy: Secondary | ICD-10-CM | POA: Insufficient documentation

## 2018-04-23 MED ORDER — DULOXETINE HCL 60 MG PO CPEP: 60.0000 mg | ORAL_CAPSULE | Freq: Every day | ORAL | 1 refills | Status: DC

## 2018-04-23 NOTE — Telephone Encounter (Signed)
Contrast material w/ instructions was also provided per 5/2 los

## 2018-04-23 NOTE — Telephone Encounter (Signed)
Appointments scheduled AVS/Calendar printed per 5/2 los °

## 2018-04-23 NOTE — Telephone Encounter (Signed)
FMLA form dropped off for at front desk for completion.  Verified that patient section of form has been completed.  Last DOS/WCC with PCP was 04/14/18.  Placed form in team folder to be completed by clinical staff.  Crista Luria

## 2018-04-24 ENCOUNTER — Encounter: Payer: Self-pay | Admitting: Hematology

## 2018-04-27 NOTE — Telephone Encounter (Signed)
Placed in MDs box to be filled out. Deseree Blount, CMA  

## 2018-04-29 ENCOUNTER — Ambulatory Visit (INDEPENDENT_AMBULATORY_CARE_PROVIDER_SITE_OTHER): Payer: BC Managed Care – PPO | Admitting: Licensed Clinical Social Worker

## 2018-04-29 DIAGNOSIS — F4323 Adjustment disorder with mixed anxiety and depressed mood: Secondary | ICD-10-CM

## 2018-04-29 NOTE — Telephone Encounter (Signed)
Form completed Given to RN

## 2018-04-29 NOTE — Progress Notes (Signed)
Type of Service: Integrated Behavioral Health 7th F/U Visit Total time:40 minutes :  Interpreter:No.    Reason for follow-up: Continue brief intervention to assist patient with managing symptoms of anxiety and depression, as well as managing stressors . Reports  extremely difficulty in daily functions. Patient is pleasant and somewhat engaged in conversation. Spends most of her time in bed not wanting to do anything.   Appearance:Neat ; Thought process: Coherent; Affect: Depressed smiling at times: No plan to harm self or others   Patient declined screening for Depression however completed GAD. States she is not feeling better. Continues to take medication daily as prescribed by PCP. Has not been able to do other interventions previously discussed. GAD 7 : Generalized Anxiety Score 04/29/2018  Nervous, Anxious, on Edge 3  Control/stop worrying 3  Worry too much - different things 3  Trouble relaxing 3  Restless 3  Easily annoyed or irritable 2  Afraid - awful might happen 3  Total GAD 7 Score 20  Anxiety Difficulty Extremely difficult   Goals: Patient will reduce symptoms of: anxiety, depression and stress , and increase  ability KG:[id number] skills, self-management skills and stress reduction, Increase healthy adjustment to current life circumstances and Increase motivation to adhere to plan of care. Intervention: Solution-Focused Strategies, Behavioral Activation and Supportive Counseling, Reflective listening, Behavioral Therapy (Relaxed breathing), Problem-solving teaching/coping strategies and Psychoeducation   Issues discussed: stressor ; education on behavior activation ;  Things patient is willing to do during the day; daily schedule ; managing symptoms of depression; things grandchildren can do to help with dinner. Assessment:Patient continues to experience symptoms of depression and anxiety. She has made little progress since visit 04/21/18.  Affect it flat , she spend most of the day in bed  due to being tired. Patient is currently out of work, work increased her symptoms.   Patient may benefit from, and is in agreement to continue further assessment and brief therapeutic interventions to assist with managing her symptoms.  Plan: 1. Patient will F/U with LCSW in 1 week 2. Behavioral recommendations: continue relaxed breathing; Follow Goal sheet 3. Referral: none at this time 4. From scale of 1-10, how likely are you to follow plan: [place], LCSW Licensed Clinical Social Worker [place]   [phone] 2:28 PM

## 2018-04-30 NOTE — Telephone Encounter (Signed)
Faxed to [employer] at [place] at [phone]. Copy made for scanning. Voicemail left for patient that originals available for pick up at front desk. Danley Danker, RN [facility] RN)

## 2018-05-02 IMAGING — US US THYROID
1 series · 13 of 25 positions shown · non-contrast
Comparison: 12/19/2015

CLINICAL DATA: Dominant right thyroid nodule

EXAM:
THYROID ULTRASOUND
TECHNIQUE: Ultrasound examination of the thyroid gland and adjacent soft
tissues was performed.

[Series 1: us thyroid · 0.05mm/px · 13 of 59 slices shown]
[im 1/59]
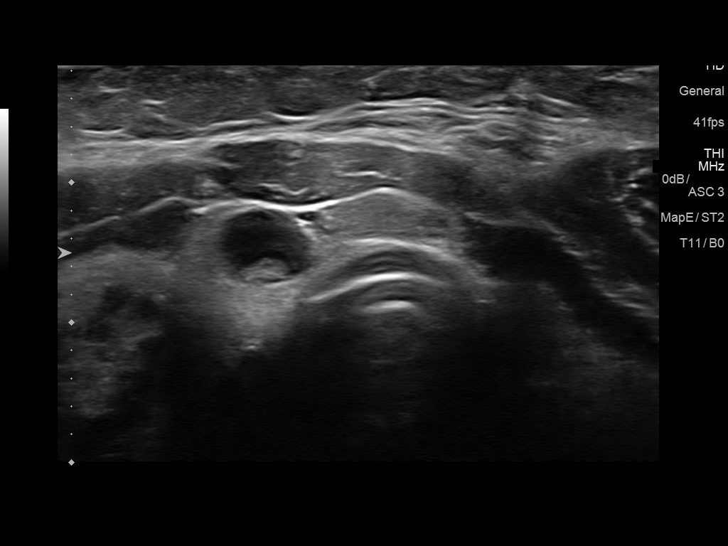
[im 5/59]
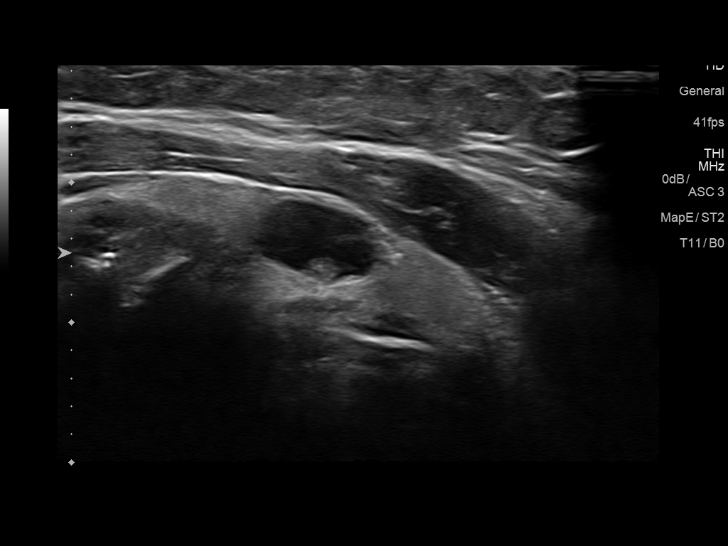
[im 10/59]
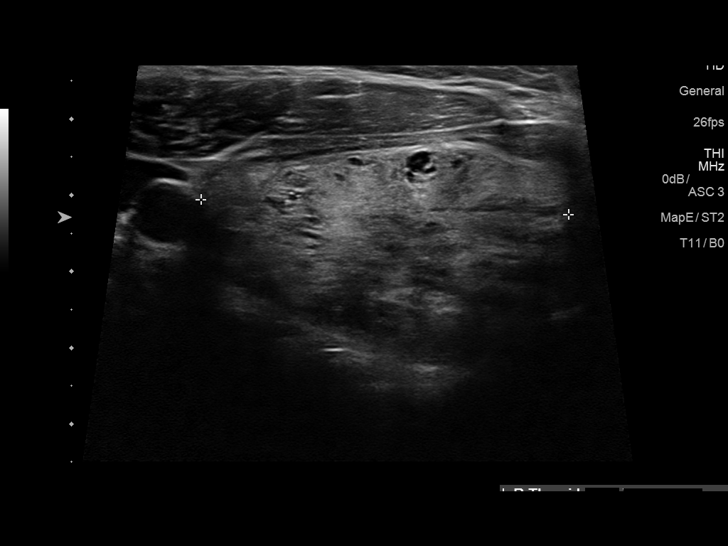
[im 15/59]
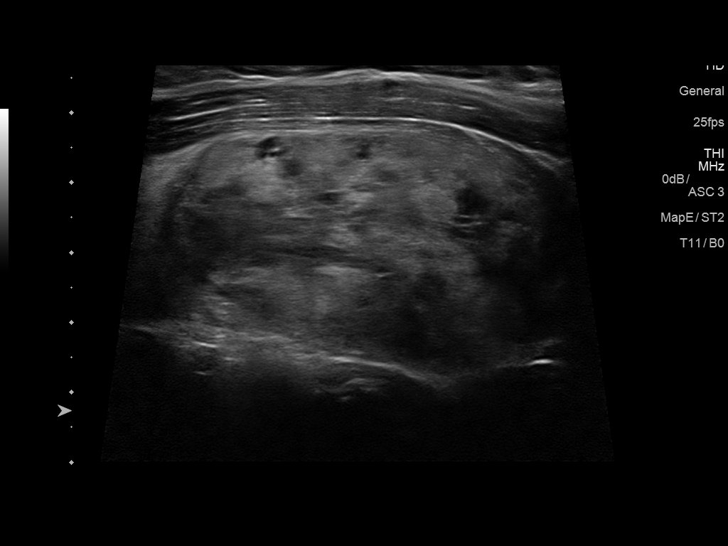
[im 20/59]
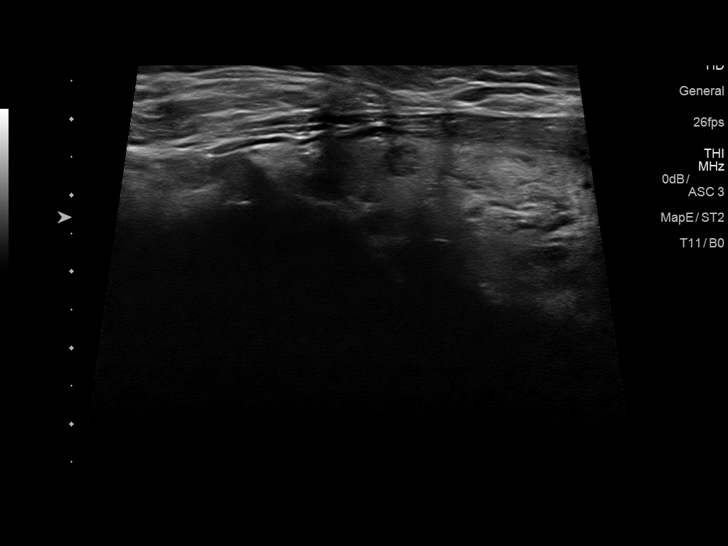
[im 25/59]
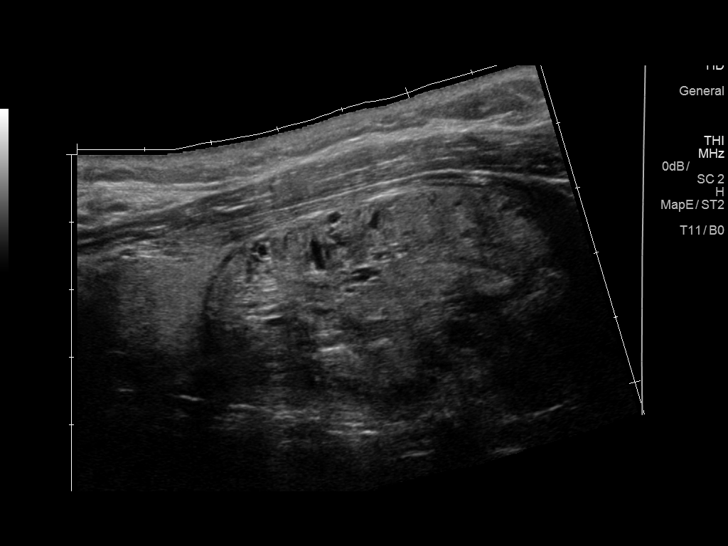
[im 30/59]
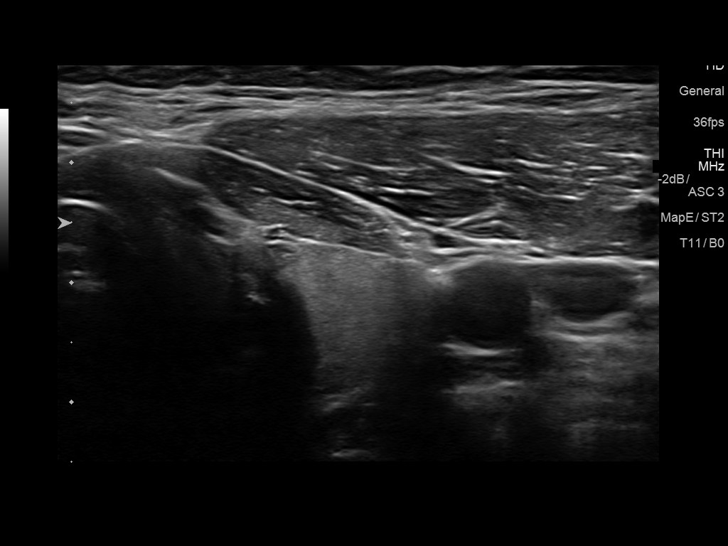
[im 34/59]
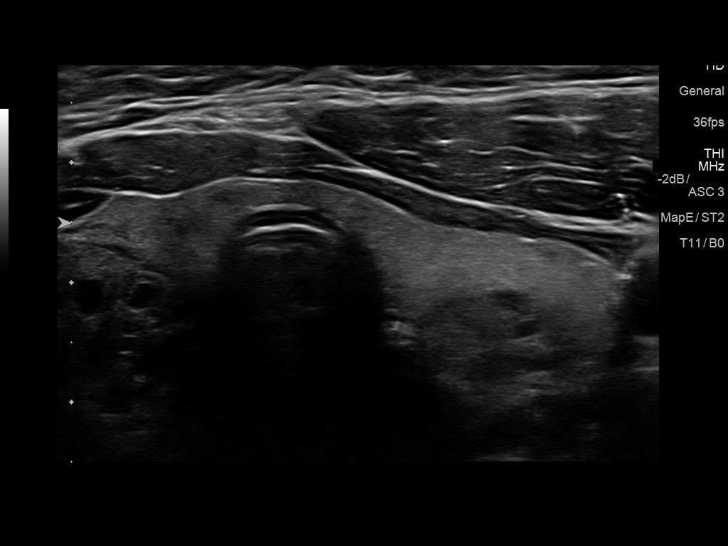
[im 39/59]
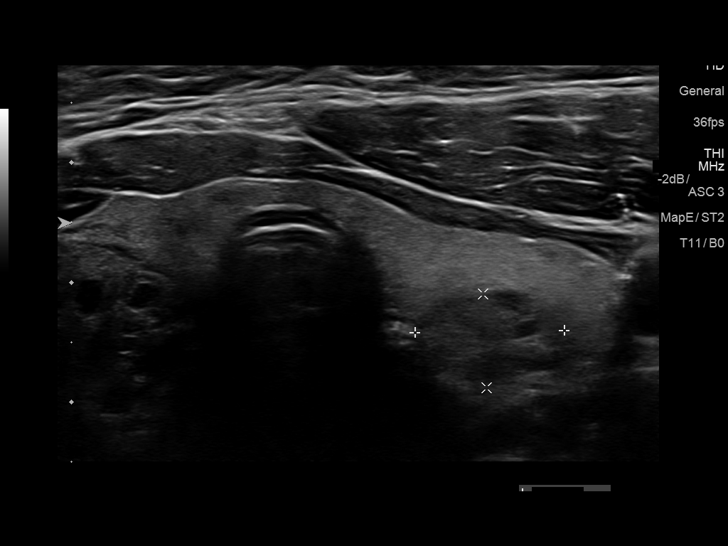
[im 44/59]
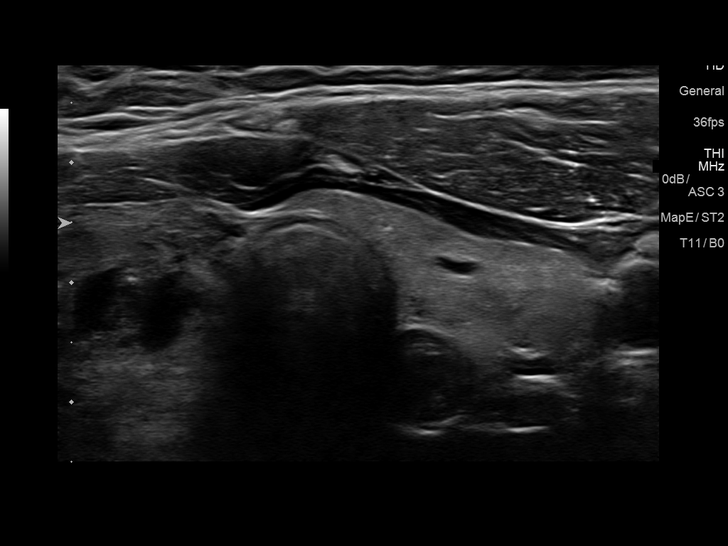
[im 49/59]
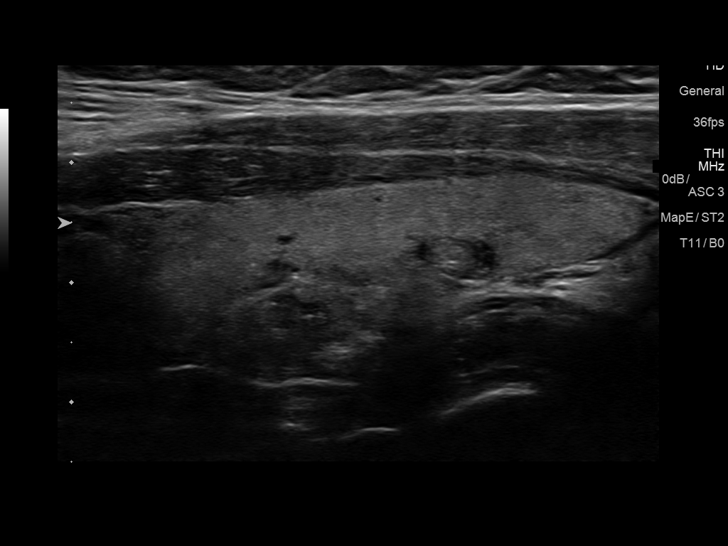
[im 54/59]
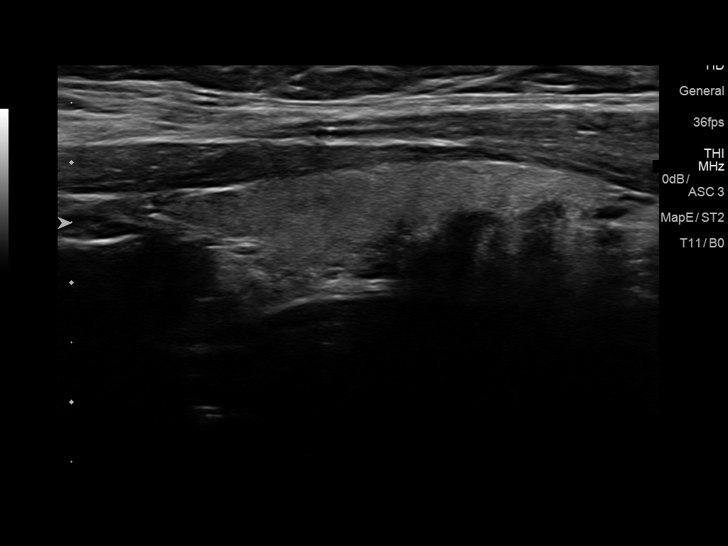
[im 59/59]
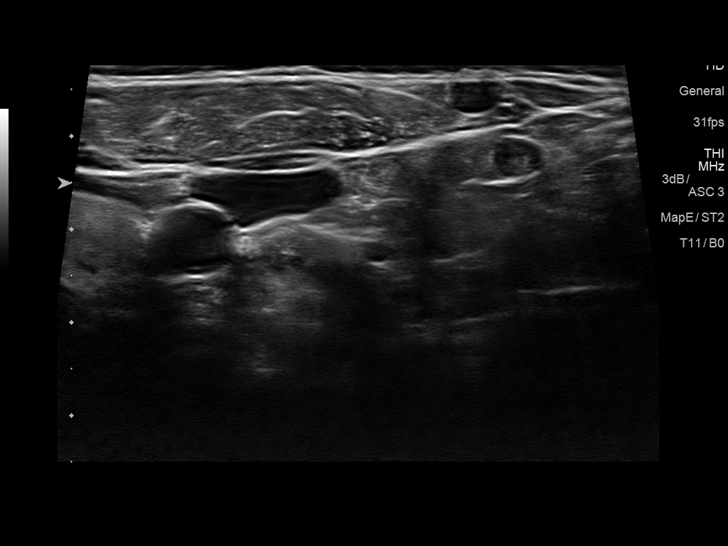

[13 of 25 positions shown; findings below may reference images not displayed]

FINDINGS: Parenchymal Echotexture: Mildly heterogenous

Isthmus: 3 mm, previously 6 Soo

Right lobe: 7.6 x 3.5 x 4.9 cm, previously 7.4 x 3.2 x 5.5 cm

Left lobe: 5.7 x 1.5 x 1.9 cm, previously 6.6 x 1.5 x 2.0 cm

_________________________________________________________

Estimated total number of nodules >/= 1 cm: 2

Number of spongiform nodules >/=  2 cm not described below (TR1): 0

Number of mixed cystic and solid nodules >/= 1.5 cm not described
below (TR2): 0

_________________________________________________________

Nodule # 1, labeled 2 on the worksheet:

Location: Right; Inferior

Maximum size: 6.3, previously 5.5 cm; Other 2 dimensions: 3.3 x 4.9,
previously 3.3 x 4.7 cm

Composition: solid/almost completely solid (2)

Echogenicity: isoechoic (1)

Shape: not taller-than-wide (0)

Margins: ill-defined (0)

Echogenic foci: none (0)

ACR TI-RADS total points: 3.

ACR TI-RADS risk category: TR3 (3 points).

ACR TI-RADS recommendations:

**Given size (>/= 2.5 cm) and appearance, fine needle aspiration of
this mildly suspicious nodule should be considered based on TI-RADS
criteria.

_________________________________________________________

Nodule # 2, labeled 3 on the worksheet:

Location: Left; Mid

Maximum size: 1.8, previously 1.8 cm; Other 2 dimensions: 0.8 x 1.3,
previously 0.8 x 1.2 cm

Composition: solid/almost completely solid (2)

Echogenicity: hypoechoic (2)

Shape: not taller-than-wide (0)

Margins: ill-defined (0)

Echogenic foci: none (0)

ACR TI-RADS total points: 4.

ACR TI-RADS risk category: TR4 (4-6 points).

ACR TI-RADS recommendations:

**Given size (>/= 1.5 cm) and appearance, fine needle aspiration of
this moderately suspicious nodule should be considered based on
TI-RADS criteria.

_________________________________________________________

There are additional subcentimeter isthmus and left thyroid nodules
noted.
IMPRESSION: 6.3 cm right inferior TR 3 nodule meets criteria for biopsy.

1.8 cm left mid TR 4 nodule also meets criteria for biopsy.

The above is in keeping with the ACR TI-RADS recommendations - [HOSPITAL] 7937;[DATE].

## 2018-05-05 ENCOUNTER — Ambulatory Visit (INDEPENDENT_AMBULATORY_CARE_PROVIDER_SITE_OTHER): Payer: BC Managed Care – PPO | Admitting: Family Medicine

## 2018-05-05 ENCOUNTER — Encounter: Payer: Self-pay | Admitting: Family Medicine

## 2018-05-05 DIAGNOSIS — Z135 Encounter for screening for eye and ear disorders: Secondary | ICD-10-CM

## 2018-05-05 DIAGNOSIS — F4323 Adjustment disorder with mixed anxiety and depressed mood: Secondary | ICD-10-CM | POA: Diagnosis not present

## 2018-05-05 NOTE — Assessment & Plan Note (Signed)
Not at goal.   See after visit summary for plans.  Will continue to follow up with counseling here.  She feels that is helpful.

## 2018-05-05 NOTE — Progress Notes (Signed)
Subjective  Jenny [name] is a 57 y.o. female is presenting with the following  DEPRESSION Disease Monitoring Current symptoms include anhedonia, difficulty concentrating and fatigue            Symptoms have been slightly better is her overall assesment     Is Exercising leaving door open to look outside but not formally walking  Evidence of suicidal ideation: no  Medication Monitoring Compliance: taking as prescribed.  Was started on Cymbalta in addition to zoloft by Oncology tolerating both ok Decreased Libido not inrterest in anything      Lightheadedness no     Insomnia sleeps a lot during the day  GI symptoms no  ROS - See HPI  PMH Previous treatment includes: none   Chief Complaint noted Review of Symptoms - see HPI PMH - Smoking status noted.    Objective Vital Signs reviewed There were no vitals taken for this visit. Seems tired but by end of visit was laughing a little Psych:  Cognition and judgment appear intact. Alert, communicative  and cooperative with normal attention span and concentration. No apparent delusions, illusions, hallucinations    Assessments/Plans  See after visit summary for details of patient instuctions  Adjustment disorder with mixed anxiety and depressed mood Not at goal.   See after visit summary for plans.  Will continue to follow up with counseling here.  She feels that is helpful.

## 2018-05-05 NOTE — Patient Instructions (Addendum)
Good to see you today!  Thanks for coming in.  Set goals for next month  Maybe walk  Get house somewhat better  For your health  - Mammogram - it will be over  - Eye exam - I put in a referral they should contact you  Come back in one month

## 2018-05-06 ENCOUNTER — Other Ambulatory Visit: Payer: Self-pay | Admitting: Family Medicine

## 2018-05-06 DIAGNOSIS — F411 Generalized anxiety disorder: Secondary | ICD-10-CM

## 2018-05-07 ENCOUNTER — Other Ambulatory Visit: Payer: Self-pay

## 2018-05-07 ENCOUNTER — Other Ambulatory Visit: Payer: Self-pay | Admitting: Hematology

## 2018-05-07 DIAGNOSIS — C772 Secondary and unspecified malignant neoplasm of intra-abdominal lymph nodes: Secondary | ICD-10-CM

## 2018-05-07 DIAGNOSIS — C187 Malignant neoplasm of sigmoid colon: Secondary | ICD-10-CM

## 2018-05-07 MED ORDER — DULOXETINE HCL 60 MG PO CPEP: ORAL_CAPSULE | ORAL | 0 refills | Status: DC

## 2018-05-08 ENCOUNTER — Ambulatory Visit: Payer: BC Managed Care – PPO | Admitting: Licensed Clinical Social Worker

## 2018-05-08 DIAGNOSIS — F4323 Adjustment disorder with mixed anxiety and depressed mood: Secondary | ICD-10-CM

## 2018-05-08 NOTE — Progress Notes (Signed)
Type of Service: [place] F/U Visit Total time:50 minutes :  Interpreter:No.    Reason for follow-up: Continue brief intervention to assist patient with managing symptoms of anxiety and depression, as well as manage stressors . Reports very difficulty in daily functions. Patient is pleasant and presents tired.  Appearance:Neat ; Thought process: Coherent; Affect: Depressed and Tearful: No plan to harm self or others.   GAD 7 : Generalized Anxiety Score 05/08/2018 04/29/2018  Nervous, Anxious, on Edge 3 3  Control/stop worrying 3 3  Worry too much - different things 3 3  Trouble relaxing 3 3  Restless 3 3  Easily annoyed or irritable 3 2  Afraid - awful might happen 3 3  Total GAD 7 Score 21 20  Anxiety Difficulty Very difficult Extremely difficult   Patient declined screening=PHQ-9,  Patient is making slow progress towards goal.  States she does not feel better "I don't like feeling like this". This is also evident by GAD score above. Goals: Patient will reduce symptoms of: anxiety and depression , and increase  ability TF:TDDUKG skills, self-management skills and stress reduction, Increase healthy adjustment to current life circumstances. Intervention: Supportive Counseling, Reflective listening, Behavioral Therapy (Relaxed breathing), Psychoeducation and crisis intervention.   Issues discussed: concerns with not getting FMLA ; ongoing depression ;  Not able to do things at home; spends most time in bed ; not able to implement interventions discussed. "My To Do List for Today".  Assessment:Patient continues to experience symptoms of depression and anxiety.  Symptoms exacerbated by financial concern and not being approved for FMLA on her job. Patient does not seem to be making much progress with decreasing symptoms of depression.  Today she was shaking her leg and hands and was not herself.  She continues to take Zoloft as  prescribed by PCP.  LCSW consulted with PCP who came  into the room to briefly meet with patient. Patient may benefit from, and is in agreement to continue further assessment and brief therapeutic interventions to assist with managing her symptoms.  Plan: 1. Patient will F/U with LCSW in 1 week 2. Behavioral recommendations: relaxed breathing; open blinds daily, make bed. "My To-Do List" 3. Referral: none at this time  [name], LCSW Licensed Clinical Social Worker [place]   [phone] 3:35 PM

## 2018-05-11 ENCOUNTER — Ambulatory Visit: Payer: BC Managed Care – PPO | Admitting: Physical Therapy

## 2018-05-13 ENCOUNTER — Ambulatory Visit: Payer: BC Managed Care – PPO

## 2018-05-14 ENCOUNTER — Ambulatory Visit: Payer: BC Managed Care – PPO | Admitting: Licensed Clinical Social Worker

## 2018-05-14 DIAGNOSIS — F4323 Adjustment disorder with mixed anxiety and depressed mood: Secondary | ICD-10-CM

## 2018-05-14 NOTE — Progress Notes (Signed)
Type of Service: [place] F/U Visit Total time:35 minutes :  Interpreter:No.    Reason for follow-up: Continue brief intervention to assist patient with managing symptoms of anxiety and depression, as well as adjustment to life transitions and stressors . Reports continued difficulty in daily functions. Patient is pleasant, smiling and engaged in conversation today.   Appearance:Neat ; Thought process: Coherent; Affect: Appropriate: No plan to harm self or others  Patient continues to decline completing GAD and PHQ-9 screening. 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    Goals: Patient will reduce symptoms of: anxiety and depression , and increase  ability ZM:OQHUTM skills, self-management skills and stress reduction, . 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  Assessment:Patient continues to experience symptoms of anxiety and depression however she is in a much better state today than last week.  She continues to take Zoloft as prescribed by PCP and getting out of the house a few days a week.   Patient may benefit from, and is in agreement to continue further assessment and brief therapeutic interventions to assist with managing symptoms. Update provided to PCP via in-basket message. Plan: LCSW will start Brief CBT during next office visit if patient is ready. Patient will work on the plan below during the few weeks 1. Patient  will F/U with LCSW in 2 weeks, sooner is needed 2. Behavioral recommendations: implement relaxed breathing, open blinds daily, work on "My To-Do List" 3. Referral: none at this time  [name], LCSW Licensed Clinical Social Worker [place]   [phone] 3:03 PM

## 2018-05-25 ENCOUNTER — Ambulatory Visit: Payer: BC Managed Care – PPO | Admitting: Physical Therapy

## 2018-05-27 ENCOUNTER — Ambulatory Visit: Payer: BC Managed Care – PPO | Admitting: Licensed Clinical Social Worker

## 2018-05-27 DIAGNOSIS — F4323 Adjustment disorder with mixed anxiety and depressed mood: Secondary | ICD-10-CM

## 2018-05-27 NOTE — Progress Notes (Signed)
Type of Service: [place] F/U Visit Total time:35 minutes :  Interpreter:No.    Reason for follow-up: Continue brief intervention to assist patient with managing symptoms of anxiety and depression, as well as stressors . Reports extremely difficulty in daily functions. Patient is pleasant and engaged in conversation, often holding her head down.   Appearance:Neat ; Thought process: Coherent; Affect: Appropriate and Tearful at times: No plan to harm self or others  GAD 7 : Generalized Anxiety Score 05/28/2018 05/08/2018 04/29/2018  Nervous, Anxious, on Edge 3 3 3   Control/stop worrying 3 3 3   Worry too much - different things 3 3 3   Trouble relaxing 3 3 3   Restless 3 3 3   Easily annoyed or irritable 2 3 2   Afraid - awful might happen 2 3 3   Total GAD 7 Score 19 21 20   Anxiety Difficulty Extremely difficult Very difficult Extremely difficult   Depression screen [facility] 2/9 05/28/2018 03/17/2018 03/03/2018  Decreased Interest 3 1 (No Data)  Down, Depressed, Hopeless 3 1 -  PHQ - 2 Score 6 2 -  Altered sleeping 3 2 -  Tired, decreased energy 3 1 -  Change in appetite 3 1 -  Feeling bad or failure about yourself  3 1 -  Trouble concentrating 2 2 -  Moving slowly or fidgety/restless 3 1 -  Suicidal thoughts (No Data) 0 -  PHQ-9 Score 23 10 -  Difficult doing work/chores - Somewhat difficult -  Some recent data might be hidden   Patient continues to make small steps with progress towards goal.  States she still does not feel like herself. This is also evident by PHQ-9/GAD score. Patient has been focusing on positive thoughts and trying to get out of the house. She continues to have difficulty completing general household task and spends most of the day in bed. Patient is taking 50mg  of zoloft as prescribed by PCP. Goals: Patient will reduce symptoms of: anxiety and depression , and increase  ability LP:FXTKWI skills and self-management skills, . Intervention: Behavioral Activation,  Brief CBT and Supportive Counseling, Reflective listening,  Psychoeducation and Consult MD. PCP joined office visit with patient. He instructed her to increase zoloft to 75mg  starting tonight.     Issues discussed: managing stressors  ; importance of support system ;  Avoiding isolation ; setting a routine ; setting 2 small goals each day. On a scale of 1-10,: patient indicated she is a 2. Discussed what it would take to get her to a 3. Assessment:Patient continues to experience symptoms of several depression .  Patient may benefit from, and is in agreement to continue further assessment and brief therapeutic interventions to assist with managing her symptoms.  Plan: LCSW will continue or start brief CBT during next office visit. Will adjust interventions based on progress. Patient will work on the plan below prior to next office visit.  1. Patient will F/U with LCSW in 2 weeks 2. Behavioral recommendations: behavior activation, and 2 goals a day 3. Referral: patient will F/U with PCP next week for review of increase in medication  4. Increase Zoloft to 75mg  per PCP.  [name], LCSW Licensed Clinical Social Worker [place]   [phone] 8:37 AM

## 2018-05-28 ENCOUNTER — Ambulatory Visit: Payer: BC Managed Care – PPO

## 2018-05-29 ENCOUNTER — Ambulatory Visit: Payer: BC Managed Care – PPO

## 2018-06-02 ENCOUNTER — Other Ambulatory Visit: Payer: Self-pay

## 2018-06-02 ENCOUNTER — Encounter: Payer: Self-pay | Admitting: Family Medicine

## 2018-06-02 ENCOUNTER — Ambulatory Visit (INDEPENDENT_AMBULATORY_CARE_PROVIDER_SITE_OTHER): Payer: BC Managed Care – PPO | Admitting: Family Medicine

## 2018-06-02 VITALS — BP 118/78 | HR 63 | Temp 98.1°F | Ht 64.0 in | Wt 255.2 lb

## 2018-06-02 DIAGNOSIS — F411 Generalized anxiety disorder: Secondary | ICD-10-CM | POA: Diagnosis not present

## 2018-06-02 DIAGNOSIS — E010 Iodine-deficiency related diffuse (endemic) goiter: Secondary | ICD-10-CM

## 2018-06-02 DIAGNOSIS — F4323 Adjustment disorder with mixed anxiety and depressed mood: Secondary | ICD-10-CM

## 2018-06-02 DIAGNOSIS — E119 Type 2 diabetes mellitus without complications: Secondary | ICD-10-CM

## 2018-06-02 LAB — POCT UA - MICROALBUMIN
Albumin/Creatinine Ratio, Urine, POC: <30
Creatinine, POC: 200 mg/dL
Microalbumin Ur, POC: 30 mg/L

## 2018-06-02 LAB — POCT GLYCOSYLATED HEMOGLOBIN (HGB A1C): HbA1c, POC (controlled diabetic range): 7.7 % — AB (ref 0.0–7.0)

## 2018-06-02 MED ORDER — SERTRALINE HCL 100 MG PO TABS: 100.0000 mg | ORAL_TABLET | Freq: Every day | ORAL | 3 refills | Status: DC

## 2018-06-02 NOTE — Progress Notes (Signed)
Subjective  Jenny [name] is a 57 y.o. female is presenting with the following  DEPRESSION She feels some better.  Mainly that her mood is not as bad.  Thinks the 75 mg sertraline helped and would like to go up to 100 mg.  Did clean one section which was her goal last week.  Not walking and sleep is still erratic  DIABETES Disease Monitoring: Blood Sugar ranges(Severity) -not checking  Associated Symptoms- Polyuria/phagia/dipsia- no      Visual problems- no Medications: Compliance(Modifying factor) - has been gaining weight she thinks due to not walking Hypoglycemic symptoms- no Timing - continuous   SWALLOWING Having problems swallowing solids.  Slowly increasing.  She feels is related to her thyroid.  No weight loss or food sticking   Monitoring Labs and Parameters Last A1C:  Lab Results  Component Value Date   HGBA1C 7.1 02/25/2018   Last Lipid:     Component Value Date/Time   CHOL 150 12/25/2016 1157   HDL 41 (L) 12/25/2016 1157   Last Bmet  Potassium  Date Value Ref Range Status  02/25/2018 4.3 3.5 - 5.2 mmol/L Final  06/18/2017 4.1 3.5 - 5.1 mEq/L Final   Sodium  Date Value Ref Range Status  02/25/2018 144 134 - 144 mmol/L Final  06/18/2017 142 136 - 145 mEq/L Final   Creatinine  Date Value Ref Range Status  06/18/2017 0.8 0.6 - 1.1 mg/dL Final   Creatinine, Ser  Date Value Ref Range Status  02/25/2018 0.67 0.57 - 1.00 mg/dL Final           Chief Complaint noted Review of Symptoms - see HPI PMH - Smoking status noted.    Objective Vital Signs reviewed BP 118/78   Pulse 63   Temp 98.1 F (36.7 C) (Oral)   Ht 5\' 4"  (1.626 m)   Wt 255 lb 3.2 oz (115.8 kg)   SpO2 97%   BMI 43.80 kg/m  Psych:  Cognition and judgment appear intact. Alert, communicative  and cooperative with normal attention span and concentration. No apparent delusions, illusions, hallucinations Diabetic Foot Check -  Appearance - no lesions, ulcers or calluses Skin - no unusual  pallor or redness Monofilament testing -  Right - Great toe, medial, central, lateral ball and posterior foot intact Left - Great toe, medial, central, lateral ball and posterior foot intact Thyromegaly noted   Assessments/Plans  See after visit summary for details of patient instuctions  Adjustment disorder with mixed anxiety and depressed mood Not at goal.  Increase sertraline.  Diabetes type 2, controlled ([place]) Check A1c I am concerned may be worsening as her weight increases   Thyromegaly Now with swallowing issues.  May need to repeat US and consider surgery if persists

## 2018-06-02 NOTE — Patient Instructions (Signed)
Good to see you today!  Thanks for coming in.  For the Mood - try another section to clean - Ask Grandkids to remind you to walk  I will review your thyroid US and may need to order another  I will contact you if your A1c is not good  Take one 100 mg sertraline a day  Continue to see Hilda Blades  Make an appointment to see me in July

## 2018-06-02 NOTE — Assessment & Plan Note (Signed)
Check A1c I am concerned may be worsening as her weight increases

## 2018-06-02 NOTE — Assessment & Plan Note (Signed)
Now with swallowing issues.  May need to repeat US and consider surgery if persists

## 2018-06-02 NOTE — Assessment & Plan Note (Signed)
Not at goal.  Increase sertraline.

## 2018-06-05 ENCOUNTER — Telehealth: Payer: Self-pay | Admitting: Family Medicine

## 2018-06-05 NOTE — Telephone Encounter (Signed)
Pt came in office dropped off some forms requesting to be filled and signed by MD. Pt stated that the forms need to be dated between July 1st and July 10th. If have questions please contact pt [phone]. Her last DOS was 06-02-18. Forms were placed in [employer] Team folder.

## 2018-06-05 NOTE — Telephone Encounter (Signed)
Reviewed Disability Eligibility paperwork and placed in PCP's box for completion. No other actions taken by clinic staff.  Jenny [name], [place]

## 2018-06-08 ENCOUNTER — Telehealth: Payer: Self-pay | Admitting: Family Medicine

## 2018-06-08 NOTE — Telephone Encounter (Signed)
Mail box full Calling to find out about date for disability

## 2018-06-10 ENCOUNTER — Ambulatory Visit: Payer: BC Managed Care – PPO

## 2018-06-11 ENCOUNTER — Telehealth: Payer: Self-pay | Admitting: Licensed Clinical Social Worker

## 2018-06-11 NOTE — Telephone Encounter (Signed)
Have attempt to call x 2 but just get VM.  Need official date her disability started

## 2018-06-11 NOTE — Progress Notes (Signed)
Service : [place] F/U Call   F/U call to patient reference missing [facility] appointment and to see how she is doing with interventions. Unable to leave voice message, mailbox is full.   Plan: LCSW will wait for patient to call and reschedule appointment.  [name], LCSW Licensed Clinical Social Worker [place]   [phone] 2:18 PM

## 2018-06-11 NOTE — Telephone Encounter (Signed)
Called - Mail box is full [employer] message

## 2018-06-12 NOTE — Telephone Encounter (Signed)
Spoke with her about the date her disability started 04/15/18  I filled out the [employer] paper work Gave return to work day as 08/23/18  She feels she is doing a little better.    Let her know Neoma Laming tried to contact her yesterday and that she should call her.

## 2018-06-15 NOTE — Telephone Encounter (Signed)
Left voicemail that forms available for pick up at front desk. Copy made for batch scanning. Danley Danker, RN [facility] RN)

## 2018-06-17 ENCOUNTER — Telehealth: Payer: Self-pay | Admitting: Licensed Clinical Social Worker

## 2018-06-17 NOTE — Progress Notes (Signed)
Service : [place] F/U Call   Return call to patient reference voice message left ref. To scheduling F/U [facility] appointment .  Appointment scheduled July 3rd at 9:30.  [name], LCSW Licensed Clinical Social Worker [place]   [phone] 3:15 PM

## 2018-06-19 ENCOUNTER — Telehealth: Payer: Self-pay | Admitting: Hematology

## 2018-06-19 NOTE — Telephone Encounter (Signed)
Called pt re appts that were changed per 6/28 sch msg - left vm for pt re appts.

## 2018-06-22 ENCOUNTER — Ambulatory Visit (HOSPITAL_COMMUNITY)
Admission: RE | Admit: 2018-06-22 | Discharge: 2018-06-22 | Disposition: A | Discharge status: Home / Self Care | Payer: BC Managed Care – PPO | Source: Ambulatory Visit | Attending: Hematology | Admitting: Hematology

## 2018-06-22 ENCOUNTER — Encounter (HOSPITAL_COMMUNITY): Payer: Self-pay

## 2018-06-22 ENCOUNTER — Inpatient Hospital Stay: Payer: BC Managed Care – PPO | Attending: Hematology

## 2018-06-22 DIAGNOSIS — M542 Cervicalgia: Secondary | ICD-10-CM | POA: Insufficient documentation

## 2018-06-22 DIAGNOSIS — C187 Malignant neoplasm of sigmoid colon: Secondary | ICD-10-CM

## 2018-06-22 DIAGNOSIS — I288 Other diseases of pulmonary vessels: Secondary | ICD-10-CM | POA: Diagnosis not present

## 2018-06-22 DIAGNOSIS — Z79899 Other long term (current) drug therapy: Secondary | ICD-10-CM | POA: Insufficient documentation

## 2018-06-22 DIAGNOSIS — C772 Secondary and unspecified malignant neoplasm of intra-abdominal lymph nodes: Secondary | ICD-10-CM

## 2018-06-22 DIAGNOSIS — G620 Drug-induced polyneuropathy: Secondary | ICD-10-CM | POA: Diagnosis present

## 2018-06-22 DIAGNOSIS — Z8249 Family history of ischemic heart disease and other diseases of the circulatory system: Secondary | ICD-10-CM | POA: Diagnosis not present

## 2018-06-22 DIAGNOSIS — Z85038 Personal history of other malignant neoplasm of large intestine: Secondary | ICD-10-CM | POA: Insufficient documentation

## 2018-06-22 DIAGNOSIS — Z800 Family history of malignant neoplasm of digestive organs: Secondary | ICD-10-CM | POA: Diagnosis not present

## 2018-06-22 DIAGNOSIS — F329 Major depressive disorder, single episode, unspecified: Secondary | ICD-10-CM | POA: Insufficient documentation

## 2018-06-22 DIAGNOSIS — E042 Nontoxic multinodular goiter: Secondary | ICD-10-CM | POA: Insufficient documentation

## 2018-06-22 DIAGNOSIS — R1310 Dysphagia, unspecified: Secondary | ICD-10-CM | POA: Insufficient documentation

## 2018-06-22 DIAGNOSIS — E079 Disorder of thyroid, unspecified: Secondary | ICD-10-CM | POA: Insufficient documentation

## 2018-06-22 LAB — CBC WITH DIFFERENTIAL/PLATELET
Basophils Absolute: 0 10*3/uL (ref 0.0–0.1)
Basophils Relative: 0 %
Eosinophils Absolute: 0.1 10*3/uL (ref 0.0–0.5)
Eosinophils Relative: 1 %
HCT: 38.1 % (ref 34.8–46.6)
Hemoglobin: 12.4 g/dL (ref 11.6–15.9)
Lymphocytes Relative: 38 %
Lymphs Abs: 2.5 10*3/uL (ref 0.9–3.3)
MCH: 27 pg (ref 25.1–34.0)
MCHC: 32.5 g/dL (ref 31.5–36.0)
MCV: 83 fL (ref 79.5–101.0)
Monocytes Absolute: 0.3 10*3/uL (ref 0.1–0.9)
Monocytes Relative: 4 %
Neutro Abs: 3.6 10*3/uL (ref 1.5–6.5)
Neutrophils Relative %: 57 %
Platelets: 283 10*3/uL (ref 145–400)
RBC: 4.59 MIL/uL (ref 3.70–5.45)
RDW: 15.5 % — ABNORMAL HIGH (ref 11.2–14.5)
WBC: 6.4 10*3/uL (ref 3.9–10.3)

## 2018-06-22 LAB — COMPREHENSIVE METABOLIC PANEL
ALT: 18 U/L (ref 0–44)
AST: 12 U/L — ABNORMAL LOW (ref 15–41)
Albumin: 4.1 g/dL (ref 3.5–5.0)
Alkaline Phosphatase: 83 U/L (ref 38–126)
Anion gap: 7 (ref 5–15)
BUN: 14 mg/dL (ref 6–20)
CO2: 26 mmol/L (ref 22–32)
Calcium: 10 mg/dL (ref 8.9–10.3)
Chloride: 104 mmol/L (ref 98–111)
Creatinine, Ser: 0.88 mg/dL (ref 0.44–1.00)
GFR calc Af Amer: >60 mL/min (ref >60)
GFR calc non Af Amer: >60 mL/min (ref >60)
Glucose, Bld: 285 mg/dL — ABNORMAL HIGH (ref 70–99)
Potassium: 3.9 mmol/L (ref 3.5–5.1)
Sodium: 137 mmol/L (ref 135–145)
Total Bilirubin: 0.3 mg/dL (ref 0.3–1.2)
Total Protein: 7.7 g/dL (ref 6.5–8.1)

## 2018-06-22 LAB — CEA (IN HOUSE-CHCC): CEA (CHCC-In House): <1 ng/mL (ref 0.00–5.00)

## 2018-06-22 MED ORDER — IOPAMIDOL (ISOVUE-300) INJECTION 61%
100.0000 mL | Freq: Once | INTRAVENOUS | Status: AC | PRN
  Administered 2018-06-22: 100 mL via INTRAVENOUS

## 2018-06-22 MED ORDER — IOPAMIDOL (ISOVUE-300) INJECTION 61%
INTRAVENOUS | Status: AC
  Filled 2018-06-22: qty 100

## 2018-06-24 ENCOUNTER — Ambulatory Visit (INDEPENDENT_AMBULATORY_CARE_PROVIDER_SITE_OTHER): Payer: BC Managed Care – PPO | Admitting: Licensed Clinical Social Worker

## 2018-06-24 DIAGNOSIS — F4323 Adjustment disorder with mixed anxiety and depressed mood: Secondary | ICD-10-CM

## 2018-06-24 NOTE — Progress Notes (Signed)
Type of Service: [place] F/U Visit Total time:45 minutes :  Interpreter:No.    Reason for follow-up: Continue brief intervention to assist patient with managing symptoms of anxiety and depression, as well as managing stressors . Reports continued difficulty in daily functions. Patient is pleasant, smiling and more engaged in conversation today.   Appearance:Neat ; Thought process: Coherent; Affect: Appropriate, reports SI at times, indicated would not act on thoughts,  : No plan to harm self or others has strong faith.  GAD 7 : Generalized Anxiety Score 06/24/2018 05/28/2018 05/08/2018 04/29/2018  Nervous, Anxious, on Edge 3 3 3 3   Control/stop worrying 2 3 3 3   Worry too much - different things 3 3 3 3   Trouble relaxing 3 3 3 3   Restless 3 3 3 3   Easily annoyed or irritable 3 2 3 2   Afraid - awful might happen 2 2 3 3   Total GAD 7 Score 19 19 21 20   Anxiety Difficulty - Extremely difficult Very difficult Extremely difficult   Depression screen [facility] 2/9 06/24/2018 05/28/2018 03/17/2018  Decreased Interest 2 3 1   Down, Depressed, Hopeless 2 3 1   PHQ - 2 Score 4 6 2   Altered sleeping 2 3 2   Tired, decreased energy 2 3 1   Change in appetite 2 3 1   Feeling bad or failure about yourself  2 3 1   Trouble concentrating 1 2 2   Moving slowly or fidgety/restless 1 3 1   Suicidal thoughts 1 (No Data) 0  PHQ-9 Score 15 23 10   Difficult doing work/chores - - Somewhat difficult  Some recent data might be hidden    Patient is making progress towards goal.  States she is starting to feel a little better. This is also evident by PHQ-9/GAD score, interaction with patient today and information she shared. Goals: Patient will  1. reduce symptoms of: anxiety, depression and stress ,  2. increase  ability FV:CBSWHQ skills, self-management skills and stress reduction,  3. Increase motivation to adhere to plan of care. Intervention: Solution-Focused Strategies and Brief CBT, Reflective listening,  Behavioral Therapy (Relaxed breathing); Problem-solving teaching/coping strategies, Psychoeducation and Supportive Counseling   Issues discussed: things patient has been able to accomplish; managing feelings/ thoughts and accountability safety plan, review of self-care, and discussed "Focus plan for behavioral activation and task. Assessment:Patient continues to experience symptoms of depression and anxiety.  Symptoms exacerbated by chronic health concerns, thoughts related to her job, and stress with raising 3 grandchildren. Patient continues to take medication as prescribed by PCP and is making progress with her treatment.  Patient may benefit from, and is in agreement to continue further assessment and therapeutic interventions to assist with managing her symptoms.  Plan: LCSW will continue brief interventions during next office visit. Will adjust interventions based on progress. Patient will work on the plan below prior to next office visit.  1. Patient will F/U with LCSW in 2 weeks  2. Behavioral recommendations: continue relaxed breathing, and utilize the "Focus plan" for task 3. Referral: schedule F/U with PCP  [name], LCSW Licensed Clinical Social Worker [place]   [phone] 10:53 AM

## 2018-06-24 NOTE — [facility] Notes (Signed)
[place]  Telephone:([phone]) [phone] Fax:(336) [phone]  Clinic Follow Up Note   Patient Care Team: Lind Covert, MD as PCP - General Juanita Craver, MD as Consulting Physician (Gastroenterology) [name], MD as Consulting Physician (General Surgery) Truitt Merle, MD as Consulting Physician (Hematology)   Date of Service:  06/29/2018  CHIEF COMPLAINTS Follow-up on worsening peripheral neuropathy  DIAGNOSIS:  Sigmoid colon cancer   Staging form: Colon and Rectum, AJCC 7th Edition     Clinical: Stage IIIB (T3, N1a, M0) - Signed by Truitt Merle, MD on 12/28/2014   Oncology History   Cancer Staging Cancer of sigmoid colon metastatic to intra-abdominal lymph node ([place]) Staging form: Colon and Rectum, AJCC 7th Edition - Clinical: Stage IIIB (T3, N1a, M0) - Signed by Truitt Merle, MD on 12/28/2014 - Pathologic stage from 11/14/2014: Stage IIIB (T3, N1a, cM0) - Signed by Truitt Merle, MD on 06/29/2018       Cancer of sigmoid colon metastatic to intra-abdominal lymph node ([place])   10/10/2014 Imaging    CT abdomen and pelvis: Eccentric wall thickening along the sigmoid colon, worrisome for early colonic adenocarcinoma. No findings suspicious for metastatic disease. CT chest (-)         11/14/2014 Initial Diagnosis    Colon cancer      11/14/2014 Pathologic Stage    pT3pN1pMx, G2, tumor invading through the muscular propria into pericolonic fatty tissue.  LVI (-), PNI (-). 1/28 node positive, margins negative.       11/14/2014 Surgery    sigmoid colon segmental resection, margins (-).       11/14/2014 Cancer Staging    Staging form: Colon and Rectum, AJCC 7th Edition - Pathologic stage from 11/14/2014: Stage IIIB (T3, N1a, cM0) - Signed by Truitt Merle, MD on 06/29/2018      12/28/2014 - 05/17/2015 Adjuvant Chemotherapy    [id number], 10% dose reduction due to neuropathy and cytopenia from cycle 5, oxaliplatin held from cycle 8 due to leg pain and neuropathy. chemo stopped  after cycle 10 due to her tolerance issue.       12/29/2015 Imaging    CT chest, abdomen and pelvis with contrast showed no evidence of recurrence.      06/19/2017 Imaging    CT CAPw contrast IMPRESSION: No evidence recurrent or metastatic colon carcinoma.  Stable mild hepatic steatosis.  5.1 cm right thyroid lobe nodule measures larger in size compared to recent studies. Thyroid ultrasound recommended for further evaluation. This recommendation follows ACR consensus guidelines: Managing Incidental Thyroid Nodules Detected on Imaging: White Paper of the ACR Incidental Thyroid Findings Committee. J Am Coll Radiol 2015;12(2):143-150.  Aortic atherosclerosis.      07/10/2017 Pathology Results    Thyroid Biopsy 07/10/17 Diagnosis THYROID, FINE NEEDLE ASPIRATION, RIGHT INFERIOR, RLP (SPECIMEN 1 OF 2 COLLECTED 07-10-2017) CONSISTENT WITH BENIGN FOLLICULAR NODULE (BETHESDA CATEGORY II).       07/10/2017 Imaging    07/10/2017 [name] FNA Thyroid IMPRESSION: 1. Technically successful ultrasound guided fine needle aspiration of right inferior thyroid nodule. 2. Technically successful ultrasound guided fine needle aspiration of left mid thyroid nodule.  07/10/2017 US Thyroid IMPRESSION: 6.3 cm right inferior TR 3 nodule meets criteria for biopsy. 1.8 cm left mid TR 4 nodule also meets criteria for biopsy.      06/22/2018 Imaging    06/22/2018 CT CAP W Contrast IMPRESSION: 1. No findings to suggest locally recurrent disease or metastatic disease in the chest, abdomen or pelvis. 2. Dilatation of the pulmonic  trunk (3.7 cm in diameter), concerning for pulmonary arterial hypertension. 3. Persistent and increasing mass-like enlargement of the right lobe of the thyroid gland. Correlation with results of prior thyroid ultrasound and biopsy is recommended.       HISTORY OF INITIAL PRESENTING ILLNESS:  Jenny [name] 57 y.o. female without significant past medical history, who is referred  by her surgeon Dr. Marcello Moores to discuss adjuvant chemotherapy for her resected colon cancer.  She presented with abdominal pain for 8-9 month, which was related to position. She noticed bloody stool in Aug 2015. She was seen by PCP, and was referred to GI Dr. Collene Mares, and underwent colonoscopy which showed a mass at sigmoid colon (per patient, I do not have the report). Biopsy showed adenocarcinoma. She was then referred to surgeon Dr. Marcello Moores and underwent sigmoid colon segmentectomy on 11/14/2014.   TREATMENT: Surveillance  INTERIM HISTORY:  Jenny [name] is a 57 y.o. female who returns for follow-up and she presents to the office by herself. Her pain has been getting worse and she has been off work because of that. She has been doing occupational therapy. Zoloft is helping. She is trying to work on her depression. She tries to stay active when she's not at work. 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 She also has some social stresses at home with her husband and children. She sometimes tris to cope with stress by binge and overeating. She snores and feels tired throughout the day.  She says that she sometimes experiences neck pain and chocks on food.  Her dysphagia is to both solids and liquids. She was found to have a thyroid nodule.   MEDICAL HISTORY:  Past Medical History:  Diagnosis Date  . Anemia   . Cancer [facility])    colon cancer   . Enlarged thyroid     SURGICAL HISTORY: Past Surgical History:  Procedure Laterality Date  . BOWEL RESECTION    . CERVICAL SPINE SURGERY  06/17/2012   C5-C7 ACDF  . CESAREAN SECTION    . PARTIAL HYSTERECTOMY    . PORT-A-CATH REMOVAL Left 07/28/2015   Procedure: REMOVAL PORT-A-CATH;  Surgeon: [name], MD;  Location: [facility];  Service: General;  Laterality: Left;  . PORTACATH PLACEMENT Left 12/22/2014   Procedure: INSERTION PORT-A-CATH LEFT SUBCLAVIAN;  Surgeon: [name], MD;  Location:  [facility];  Service: General;  Laterality: Left;  . TONSILLECTOMY      SOCIAL HISTORY: History   Social History  . Marital Status: Married    Spouse Name: N/A    Number of Children: 2  . Years of Education: N/A   Occupational History  . Not on file.   Social History Main Topics  . Smoking status: Never Smoker   . Smokeless tobacco: Never Used  . Alcohol Use: No  . Drug Use: No  . Sexual Activity: Not on file    P2G2, one child was murdered. She is a Oncologist.   FAMILY HISTORY: Family History  Problem Relation Age of Onset  . Colon cancer Neg Hx   . Liver Cancer Brother        . Hypertension Mother     ALLERGIES:  is allergic to other and shellfish allergy.  MEDICATIONS:  Current Outpatient Medications  Medication Sig Dispense Refill  . acetaminophen (TYLENOL) 500 MG tablet Take 500 mg by mouth every 6 (six) hours as needed.    . diphenhydrAMINE (  BENADRYL) 12.5 MG/5ML liquid Take 12.5-25 mg by mouth every 4 (four) hours as needed for itching or allergies.    . DULoxetine (CYMBALTA) 60 MG capsule TAKE 1 CAPSULE BY MOUTH EVERY DAY 30 capsule 0  . fluticasone (FLONASE) 50 MCG/ACT nasal spray Place 2 sprays into both nostrils daily. 16 g 2  . hydrocortisone cream 0.5 % Apply 1 application topically 2 (two) times daily as needed for itching.    . hydrOXYzine (ATARAX/VISTARIL) 25 MG tablet Take 1 tablet (25 mg total) by mouth 3 (three) times daily as needed for anxiety. 30 tablet 0  . loratadine (CLARITIN) 10 MG tablet Take 10 mg by mouth daily. AS NEEDED    . Multiple Vitamin (MULTIVITAMIN) tablet Take 1 tablet by mouth daily.    . naproxen sodium (ANAPROX) 220 MG tablet Take 1 tablet (220 mg total) by mouth 2 (two) times daily as needed. 60 tablet 1  . sertraline (ZOLOFT) 100 MG tablet Take 1 tablet (100 mg total) by mouth daily. 30 tablet 3   No current facility-administered medications for this visit.     REVIEW OF SYSTEMS:   Constitutional: Denies fevers,  chills or abnormal night sweats (+) fatigue (+) full body aches  Eyes: Denies blurriness of vision, double vision or watery eyes Ears, nose, mouth, throat, and face: Denies mucositis or sore throat Respiratory: Denies cough, dyspnea or wheezes Cardiovascular: Denies palpitation, chest discomfort  Gastrointestinal:  Denies nausea, heartburn or change in bowel habits  Skin: Denies abnormal skin rashes Lymphatics: Denies new lymphadenopathy or easy bruising Neurological:Denies numbness, tingling or new weaknesses (+) significant neuropathy in extremities with pain  Behavioral/Psych: (+)stress and  depression  All other systems were reviewed with the patient and are negative.  PHYSICAL EXAMINATION:  ECOG PERFORMANCE STATUS: 2  BP 118/82 (BP Location: Right Arm, Patient Position: Sitting)   Pulse 78   Temp 97.8 F (36.6 C) (Oral)   Resp 17   Ht 5' 4" (1.626 m)   Wt 260 lb 11.2 oz (118.3 kg)   SpO2 97%   BMI 44.75 kg/m    GENERAL:alert, no distress and comfortable, (+) obese  SKIN: skin color, texture, turgor are normal, no rashes or significant lesions EYES: normal, conjunctiva are pink and non-injected, sclera clear OROPHARYNX:no exudate, no erythema and lips, buccal mucosa, and tongue normal  NECK: (+) pain on extension. thyroid normal size, non-tender(+) right thyroid nodule 5 x 2 cm LYMPH:  no palpable lymphadenopathy in the cervical, axillary or inguinal LUNGS: clear to auscultation and percussion with normal breathing effort HEART: regular rate & rhythm and no murmurs and no lower extremity edema ABDOMEN:abdomen soft, non-tender and normal bowel sounds. Surgical wound has well-healed  Musculoskeletal:no cyanosis of digits and no clubbing  PSYCH: alert & oriented x 3 with fluent speech (+) depressed NEURO: no focal motor/sensory deficits, moderate decreased vibration sensation on her hands and feet  LABORATORY DATA:  I have reviewed the data as listed CBC Latest Ref Rng &  Units 06/22/2018 02/25/2018 12/30/2017  WBC 3.9 - 10.3 K/uL 6.4 4.9 5.7  Hemoglobin 11.6 - 15.9 g/dL 12.4 12.3 12.2  Hematocrit 34.8 - 46.6 % 38.1 38.1 37.7  Platelets 145 - 400 K/uL 283 284 262    CMP Latest Ref Rng & Units 06/22/2018 02/25/2018 12/30/2017  Glucose 70 - 99 mg/dL 285(H) 114(H) 110  BUN 6 - 20 mg/dL _0 Creatinine 0.44 - 1.00 mg/dL 0.88 0.67 0.82  Sodium 135 - 145 mmol/L 137 144  140  Potassium 3.5 - 5.1 mmol/L 3.9 4.3 3.8  Chloride 98 - 111 mmol/L 104 105 106  CO2 22 - 32 mmol/L _0 Calcium 8.9 - 10.3 mg/dL 10.0 9.7 9.6  Total Protein 6.5 - 8.1 g/dL 7.7 7.6 7.4  Total Bilirubin 0.3 - 1.2 mg/dL 0.3 0.4 0.4  Alkaline Phos 38 - 126 U/L 83 66 70  AST 15 - 41 U/L 12(L) 12 13  ALT 0 - 44 U/L _1 Results for ERCELLE, WINKLES (MRN [id number]) as of 06/25/2017 17:38  Ref. Range 11/11/2016 09:05 02/10/2017 15:44 06/18/2017 16:10 12/30/2017 13:43  CEA Latest Ref Range: 0.0 - 4.7 ng/mL 2.1 2.3    CEA ([facility]) Latest Ref Range: 0.00 - 5.00 ng/mL <1.00 <1.00 <1.00 <1.00    PATHOLOGY   Thyroid Biopsy 07/10/17 Diagnosis THYROID, FINE NEEDLE ASPIRATION, RIGHT INFERIOR, RLP (SPECIMEN 1 OF 2 COLLECTED 07-10-2017) CONSISTENT WITH BENIGN FOLLICULAR NODULE (BETHESDA CATEGORY II).  Diagnosis 11/14/14 1. Colon, segmental resection for tumor, sigmoid - INVASIVE ADENOCARCINOMA, INVADING THROUGH THE MUSCULARIS PROPRIA INTO PERICOLONIC FATTY TISSUE. - ONE OF TWENTY-EIGHT LYMPH NODES, POSITIVE FOR METASTATIC CARCINOMA (1/28). - RESECTION MARGINS, NEGATIVE FOR ATYPIA OR MALIGNANCY - RESECTION MARGINS, NEGATIVE FOR ATYPIA OR MALIGNANCY. 2. Colon, resection margin (donut), colon final distal margin - BENIGN COLONIC MUCOSA, NO EVIDENCE OF MALIGNANCY. Microscopic Comment 1. COLON Specimen: Sigmoid colon Procedure: Segmental resection Tumor site: Sigmoid colon Specimen integrity: Intact Macroscopic intactness of mesorectum: N/A Macroscopic tumor perforation: No Invasive  tumor: Maximum size: 2.7 cm, gross measurement Histologic type(s): Invasive adenocarcinoma Histologic grade and differentiation: G2: moderately differentiated/low grade Type of polyp tumor arose from: Tubular adenoma Microscopic extension of invasive tumor: Invading through the muscularis propria into pericolonic fatty tissue. Lymph-Vascular invasion: Not identified Peri-neural invasion: Not identified Tumor deposit(s) (discontinuous extramural extension): N/A Resection margins: Negative Open margin: 7.1 cm Stapled margin: 5.5 cm Mesenteric margin (sigmoid and transverse): 9 cm Treatment effect (neo-adjuvant therapy): No Additional polyp(s): N/A Non-neoplastic findings: N/A Lymph nodes: number examined 28; number positive: 1 Pathologic Staging: pT3, pN1a, pMX Ancillary studies: MMR stains will be performed and an addendum report will follow. MSI testing will be performed at an outside institution and the results will be available in EPIC. (HCL:kh 11-15-14)  TUMOR MSI: stable (by PCR),  MMR: NORMAL       RADIOGRAPHIC STUDIES: I have personally reviewed the radiological images as listed and agreed with the findings in the report.  06/22/2018 CT CAP W Contrast IMPRESSION: 1. No findings to suggest locally recurrent disease or metastatic disease in the chest, abdomen or pelvis. 2. Dilatation of the pulmonic trunk (3.7 cm in diameter), concerning for pulmonary arterial hypertension. 3. Persistent and increasing mass-like enlargement of the right lobe of the thyroid gland. Correlation with results of prior thyroid ultrasound and biopsy is recommended.  07/10/2017 [name] FNA Thyroid IMPRESSION: 1. Technically successful ultrasound guided fine needle aspiration of right inferior thyroid nodule. 2. Technically successful ultrasound guided fine needle aspiration of left mid thyroid nodule.  07/10/2017 US Thyroid IMPRESSION: 6.3 cm right inferior TR 3 nodule meets criteria for  biopsy. 1.8 cm left mid TR 4 nodule also meets criteria for biopsy.  CT CAPw contrast 06/19/2017 IMPRESSION: No evidence recurrent or metastatic colon carcinoma.  Stable mild hepatic steatosis.  5.1 cm right thyroid lobe nodule measures larger in size compared to recent studies. Thyroid ultrasound recommended for further evaluation. This recommendation follows ACR consensus guidelines: Managing Incidental Thyroid Nodules  Detected on Imaging: White Paper of the ACR Incidental Thyroid Findings Committee. J Am Coll Radiol 2015;12(2):143-150.  Aortic atherosclerosis.  CT Head wo contrast 04/03/2017 - normal head CT  US Breast LTD Unilateral Left Axilla 10/09/2016 IMPRESSION: Stable adjacent likely benign masses involving the lower outer periareolar left breast, likely fibroadenomas.  CT chest, abdomen and pelvis 07/01/2016 IMPRESSION: 1. Stable exam. No new or progressive findings to suggest recurrent/metastatic disease. 2. Hepatic steatosis. 3. Stable asymmetric enlargement right thyroid lobe, consistent with underlying nodule/mass.  ASSESSMENT & PLAN:  57 y.o. African-American female, without significant past medical history, presents with abdominal pain and bloody bowel movement, anemia, was found to have a sigmoid adenocarcinoma, status post complete surgical resection.   1.  Worsening peripheral neuropathy, b/l leg and body pain, secondary to chemo, G2-3 -She has developed significant peripheral neuropathy after chemotherapy, with bilateral leg and body pain, numbness and tingling of her fingers and toes. She takes Neurontin, Tylenol or ibuprofen as needed, she was previously on Vicodin once to twice  A week, off now -She has previously completed physical therapy and will continue exercise  -Her neuropathy has significantly gotten worse since she returned to work, she is not able to perform her job.  I wrote a letter for her to be off work for the next 2 months on last  visit, and renewed for her today. -Previously discussed and prescribed Cymbalta for her, but her primary care physician is concerned about interaction between both and Zoloft, and she has not started.  She is on Neurontin -She does have depression and anxiety, which probably makes her pain worse. She is on Zoloft and has been receiving frequent counseling from her social work at her primary care physician's office.  2. Sigmoid colon adenocarcinoma, G2, pT3pN1aM0, stage IIIB, MSI stable  -the nature history of colon cancer, and high risk of cancer recurrence. IIIB disease with previously discussed and reviewed with her -She is clinically doing well, still has some residual neuropathy and body aches from chemotherapy.  -Her physical exam today was normal. I reviewed her lab results, including CEA, which are all normal. -I previously discussed her repeat his CT scan from 07/01/2016, which showed NED -we'll continue surveillance. I'll see her every 3-4 months for the first 2 years, then every 6 months afterwards for total 5 years. We will check her lab and CEA was each visit, CT scan every 6-12 months. -her colonoscopy in 10/2015 was unremarkable, done by Dr. Collene Mares   -05/2017 scan results discussed, NED. She is doing well. No evidence of cancer reoccurance -I previously recommended right thyroid nodule biopsy. She is agreeable. Her thyroid biopsy from 06/2017 was benign. - She is almost 4 years out of her diagnosis now, we again discussed the risk of recurrence decreases significantly after the first 3 years.  -Restaging surveillance CT scan from June 22, 2018 showed no evidence of cancer recurrence. I reviewed with her today  -Continue cancer advanced until the end of next year, for total of 5 years.  I do not plan to repeat surveillance CT scans.  -f/u in 6 months   3. Obesity and  Hepatic steatosis - I previously encouraged her to lose some weight, by diet control and exercise -she has mild fatty liver  on CT scan,  I encouraged her to follow-up with her primary care physician and check her lipid profile at least once a year. I encouraged her to lose weight.  4. Diabetes -Not on medication -Monitored by PCP  5. Depression and anxiety  -Continue on zoloft as recently prescribed by PCP -She will maintain follow up with the SW for depression counseling her PCPs office  6. ?  Pulmonary hypertension, rule out sleep apnea -Her CT scan showed dilated pulmonary artery, concerning for pulmonary hypertension. -She is obese, has short neck, snores at night, and few tired during the day.  She may have sleep apnea, I will refer her to see a pulmonologist     Plan  -refer to pulmonary for pulmonary hypertension and polysomnography. - lab and follow up in 6 months -She will follow-up with her primary care physician closely -I wrote her a letter for her to be off work for 2 months.  She will contact her primary care physician for letters of work absense in the future  All questions were answered. The patient knows to call the clinic with any problems,  questions or concerns.  I spent 20 minutes counseling the patient face to face. The total time spent in the appointment was 25 minutes and more than 50% was on counseling.  Dierdre Searles Dweik am acting as scribe for Dr. Truitt Merle.  I have reviewed the above documentation for accuracy and completeness, and I agree with the above.    Truitt Merle, MD 06/29/2018

## 2018-06-29 ENCOUNTER — Encounter: Payer: Self-pay | Admitting: Hematology

## 2018-06-29 ENCOUNTER — Telehealth: Payer: Self-pay

## 2018-06-29 ENCOUNTER — Inpatient Hospital Stay (HOSPITAL_BASED_OUTPATIENT_CLINIC_OR_DEPARTMENT_OTHER): Payer: BC Managed Care – PPO | Admitting: Hematology

## 2018-06-29 VITALS — BP 118/82 | HR 78 | Temp 97.8°F | Resp 17 | Ht 64.0 in | Wt 260.7 lb

## 2018-06-29 DIAGNOSIS — Z85038 Personal history of other malignant neoplasm of large intestine: Secondary | ICD-10-CM

## 2018-06-29 DIAGNOSIS — C772 Secondary and unspecified malignant neoplasm of intra-abdominal lymph nodes: Secondary | ICD-10-CM

## 2018-06-29 DIAGNOSIS — I272 Pulmonary hypertension, unspecified: Secondary | ICD-10-CM

## 2018-06-29 DIAGNOSIS — M542 Cervicalgia: Secondary | ICD-10-CM

## 2018-06-29 DIAGNOSIS — G620 Drug-induced polyneuropathy: Secondary | ICD-10-CM

## 2018-06-29 DIAGNOSIS — F329 Major depressive disorder, single episode, unspecified: Secondary | ICD-10-CM

## 2018-06-29 DIAGNOSIS — C187 Malignant neoplasm of sigmoid colon: Secondary | ICD-10-CM

## 2018-06-29 DIAGNOSIS — Z800 Family history of malignant neoplasm of digestive organs: Secondary | ICD-10-CM

## 2018-06-29 DIAGNOSIS — E042 Nontoxic multinodular goiter: Secondary | ICD-10-CM

## 2018-06-29 DIAGNOSIS — T451X5A Adverse effect of antineoplastic and immunosuppressive drugs, initial encounter: Secondary | ICD-10-CM

## 2018-06-29 DIAGNOSIS — R1310 Dysphagia, unspecified: Secondary | ICD-10-CM | POA: Diagnosis not present

## 2018-06-29 DIAGNOSIS — Z79899 Other long term (current) drug therapy: Secondary | ICD-10-CM

## 2018-06-29 DIAGNOSIS — Z8249 Family history of ischemic heart disease and other diseases of the circulatory system: Secondary | ICD-10-CM

## 2018-06-29 DIAGNOSIS — F419 Anxiety disorder, unspecified: Secondary | ICD-10-CM

## 2018-06-29 NOTE — Telephone Encounter (Signed)
Printed avs and calender of upcoming appointment. Per 7/8 los 

## 2018-06-30 ENCOUNTER — Ambulatory Visit: Payer: BC Managed Care – PPO | Admitting: Family Medicine

## 2018-07-01 ENCOUNTER — Other Ambulatory Visit: Payer: Self-pay | Admitting: Hematology

## 2018-07-01 ENCOUNTER — Ambulatory Visit: Payer: BC Managed Care – PPO | Admitting: Licensed Clinical Social Worker

## 2018-07-01 ENCOUNTER — Other Ambulatory Visit: Payer: Self-pay

## 2018-07-01 ENCOUNTER — Encounter: Payer: Self-pay | Admitting: Family Medicine

## 2018-07-01 ENCOUNTER — Ambulatory Visit (INDEPENDENT_AMBULATORY_CARE_PROVIDER_SITE_OTHER): Payer: BC Managed Care – PPO | Admitting: Family Medicine

## 2018-07-01 DIAGNOSIS — Q2579 Other congenital malformations of pulmonary artery: Secondary | ICD-10-CM

## 2018-07-01 DIAGNOSIS — F4323 Adjustment disorder with mixed anxiety and depressed mood: Secondary | ICD-10-CM

## 2018-07-01 DIAGNOSIS — E041 Nontoxic single thyroid nodule: Secondary | ICD-10-CM

## 2018-07-01 DIAGNOSIS — E010 Iodine-deficiency related diffuse (endemic) goiter: Secondary | ICD-10-CM | POA: Diagnosis not present

## 2018-07-01 NOTE — [place] Notes (Signed)
Type of Service: [place] F/U Visit Total time:30 minutes :  Interpreter:No.    Reason for follow-up: Continue brief intervention to assist patient with managing symptoms of anxiety and depression, as well as family stressors . Reports some difficulty in daily functions. Patient is pleasant , smiling and engaged in conversation.   Appearance:Neat ; Thought process: Coherent; Affect: Appropriate: No plan to harm self or others Patient is making progress towards goal.  States she is starting to feel a littel better. This is also evident by PHQ-9/GAD score. GAD 7 : Generalized Anxiety Score 07/01/2018 06/24/2018 05/28/2018 05/08/2018  Nervous, Anxious, on Edge 2 3 3 3   Control/stop worrying 1 2 3 3   Worry too much - different things 2 3 3 3   Trouble relaxing 1 3 3 3   Restless 1 3 3 3   Easily annoyed or irritable 2 3 2 3   Afraid - awful might happen 2 2 2 3   Total GAD 7 Score 11 19 19 21   Anxiety Difficulty - - Extremely difficult Very difficult   Depression screen [facility] 2/9 07/01/2018 06/24/2018 05/28/2018  Decreased Interest 2 2 3   Down, Depressed, Hopeless 2 2 3   PHQ - 2 Score 4 4 6   Altered sleeping 2 2 3   Tired, decreased energy 2 2 3   Change in appetite 1 2 3   Feeling bad or failure about yourself  1 2 3   Trouble concentrating 1 1 2   Moving slowly or fidgety/restless - 1 3  Suicidal thoughts (No Data) 1 (No Data)  PHQ-9 Score 11 15 23   Difficult doing work/chores - - -  Some recent data might be hidden   Goals: Patient will  1. reduce symptoms of: anxiety, depression and stress ,  2. increase  ability ER:DEYCXK skills, self-management skills and stress reduction,  3. Increase healthy adjustment to current life circumstances. Intervention: Solution-Focused Strategies, Behavioral Activation and Brief CBT, Reflective listening, Behavioral Therapy (Relaxed breathing); Supportive Counseling. Issues discussed: goals accomplished using "Focus Plan  ; concerns with job ;  establishing a New Normal; Change/life transistions ; self-care. Assessment:Patient continues to experience symptoms of anxiety and depression. However, patient is making progress towards goals, she is not lethargic during office visits and excited about the small steps made with goals.   Patient may benefit from, and is in agreement to continue further assessment and brief therapeutic interventions to assist with managing her symptoms.  Plan: LCSW will continue brief interventions during next office visit. Will adjust based on progress. Patient will work on the plan below prior to next office visit.  1. Patient will F/U with LCSW in 2 weeks 2. Behavioral recommendations: continue relaxed breathing, [name] focus plan with goals, and reach info. On Change. 3. Referral: none at this time  [name], LCSW Licensed Clinical Social Worker [place]   [phone] 9:24 AM

## 2018-07-01 NOTE — Assessment & Plan Note (Signed)
May need cardiology evaluation and perhaps sleep apnea testing.  She does not want to address until feeling better from mental health perspective

## 2018-07-01 NOTE — Assessment & Plan Note (Signed)
Slowly improving.  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

## 2018-07-01 NOTE — [place] Notes (Signed)
Subjective  Jenny [name] is a 57 y.o. female is presenting with the following  DEPRESSION Disease Monitoring Current symptoms include anhedonia, difficulty concentrating and fatigue            Symptoms have been gradually improving     Is Exercising not yet  Evidence of suicidal ideation: no  Medication Monitoring Compliance: taking as prescribed Decreased Libido no      Lightheadedness no     Insomnia some improvement  GI symptoms no  THYROMEGALY Not as much swallowing problems as before.  Gaining weight.  Recent CT showed increased size.    PULMONIC TRUNK DILATION On recent CT.  She does not have chest pain or severe shortness of breath with exertion but does not exercise.  Trace edema only    Chief Complaint noted Review of Symptoms - see HPI PMH - Smoking status noted.    Objective Vital Signs reviewed BP 132/84   Pulse (!) 52   Temp 98.2 F (36.8 C) (Oral)   Ht 5\' 4"  (1.626 m)   Wt 259 lb 6.4 oz (117.7 kg)   SpO2 98%   BMI 44.53 kg/m  Psych:  Cognition and judgment appear intact. Alert, communicative  and cooperative with normal attention span and concentration. No apparent delusions, illusions, hallucinations  Assessments/Plans  See after visit summary for details of patient instuctions  Pulmonary artery abnormality May need cardiology evaluation and perhaps sleep apnea testing.  She does not want to address until feeling better from mental health perspective  Adjustment disorder with mixed anxiety and depressed mood Slowly improving.  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  Symptoms of swallowing are stable.  Will monitor

## 2018-07-01 NOTE — Patient Instructions (Signed)
Good to see you today!  Thanks for coming in.  Come back in one month.  We may start Metformin for the diabetes and weight   We can refer you to a surgeon if the thyroid is bothering you  We can refer you in the future to a cardiologist for the pulmonary artery as you feel ready

## 2018-07-01 NOTE — Assessment & Plan Note (Signed)
Increase in size noted on 06-22-18 cancer surveillance CT.  Symptoms of swallowing are stable.  Will monitor

## 2018-07-02 ENCOUNTER — Telehealth: Payer: Self-pay

## 2018-07-02 ENCOUNTER — Telehealth: Payer: Self-pay | Admitting: Family Medicine

## 2018-07-02 NOTE — Telephone Encounter (Signed)
Pt called and said that she had disability forms filled out but the dates on the form was wrong. It was dated for June and not July. She says the date should be ranging from July 1st- July 10th. She is going to bring the form to be re filled out this morning. She said she needs it by today if possible because it is due today and if she does not have it she will not get paid

## 2018-07-02 NOTE — Telephone Encounter (Signed)
I amended the date while patient was waiting

## 2018-07-02 NOTE — Telephone Encounter (Signed)
Spoke with patient per Dr. Burr Medico informed that Dr. Burr Medico is referring her to Dr. Lucia Gaskins for thyroid surgery and she should be receiving a phone call.  Patient verbalized an understanding.

## 2018-07-02 NOTE — Telephone Encounter (Signed)
-----   Message from Truitt Merle, MD sent at 07/01/2018 10:04 PM EDT ----- Shanon Brow,  That's great, thanks much, I will refer her to you.   Malachy Mood, please let pt know that I am referring her to Dr. Lucia Gaskins for thyroid surgery, his office will call her for appointment.   Thanks  Krista Blue  ----- Message ----- From: Alphonsa Overall, MD Sent: 07/01/2018   8:06 PM To: Truitt Merle, MD  Krista Blue, Yes I do thyroid surgery. I would happy to see her in the office to discuss the surgical options.  The right lobe is fairly impressive on CT scan.  Thanks, Shanon Brow  ----- Message ----- From: Truitt Merle, MD Sent: 06/29/2018   3:41 PM To: Alphonsa Overall, MD  Ronne Binning you do thyroid surgery? She has a 6cm mass in right lobe of her thyroid, getting bigger overtime. We did biopsy last year and it was benign. Could you review her scans and biopsy results, and let me know if she needs surgery? She also states she has mild dysphagia and cough with swallowing.   Thanks much,  Krista Blue

## 2018-07-06 ENCOUNTER — Other Ambulatory Visit: Payer: Self-pay | Admitting: Family Medicine

## 2018-07-06 DIAGNOSIS — F411 Generalized anxiety disorder: Secondary | ICD-10-CM

## 2018-07-15 ENCOUNTER — Ambulatory Visit: Payer: BC Managed Care – PPO

## 2018-07-16 ENCOUNTER — Ambulatory Visit: Payer: BC Managed Care – PPO | Admitting: Licensed Clinical Social Worker

## 2018-07-16 DIAGNOSIS — F4323 Adjustment disorder with mixed anxiety and depressed mood: Secondary | ICD-10-CM

## 2018-07-16 NOTE — Progress Notes (Signed)
Type of Service: [place]  F/U Visit Total time:35 minutes :  Interpreter:No.    Reason for follow-up: Continue brief intervention to assist patient with managing symptoms of anxiety and depression, as well as managing stressors . Reports  Somewhat difficulty in daily functions. Patient is pleasant and engaged in conversation.   Appearance:Neat ; Thought process: Coherent; Affect: Appropriate No SI and : No plan to harm self or others  Patient is making progress towards goal.  States she is starting to feel better. No longer having negative thoughts and able to accomplish small task and goals that she has set for herself. Goals: Patient will  1. continue to reduce symptoms of: anxiety, depression and stress ,  2. increase  ability DJ:MEQAST skills, self-management skills and stress reduction,  3. Increase motivation to adhere to plan of care. 4. Work on meal planning for health eating  Intervention: Solution-Focused Strategies and Veterinary surgeon, Reflective listening, ; Problem-solving teaching/coping strategies and Psychoeducation   Issues discussed: managing negative thoughts  ; setting small goals ;  Meal planning with diabetes self-care; concerns with school starting in Aug ; review of book. Assessment:Patient continues to experience symptoms of anxiety and depress.  She has made a lot of progress and is moving in the right direction.  Patient may benefit from, and is in agreement to continue further brief therapeutic interventions to assist with managing her symptoms.  Plan: LCSW will continue with brief interventions during next office visit. Will adjust based on progress.   1. Patient will F/U with LCSW in 3  weeks 2. Behavioral recommendations: continue relaxed breathing, read book, meal planning for diabetes, and self-care. 3. Referral: none at this time 4.  Keep F/U appointment with PCP.  [name], LCSW Licensed Clinical Social Worker [place]   [phone] 4:26 PM

## 2018-07-27 ENCOUNTER — Ambulatory Visit: Payer: BC Managed Care – PPO | Admitting: Pulmonary Disease

## 2018-07-27 ENCOUNTER — Encounter: Payer: Self-pay | Admitting: Pulmonary Disease

## 2018-07-27 ENCOUNTER — Telehealth: Payer: Self-pay | Admitting: Family Medicine

## 2018-07-27 DIAGNOSIS — G4710 Hypersomnia, unspecified: Secondary | ICD-10-CM | POA: Diagnosis not present

## 2018-07-27 DIAGNOSIS — Q2579 Other congenital malformations of pulmonary artery: Secondary | ICD-10-CM | POA: Diagnosis not present

## 2018-07-27 NOTE — Telephone Encounter (Signed)
Pt came in office and dropped a short term disability form requesting to be filled and signed by MD. Pt is also requesting if this can be done and be ready to pick up by Thursday 07/30/2018, because she said she not get get paid. I told pt that it may take a week to get completed. Last DOS was 07/01/2018. Best phone # to contact is [phone]. Form was placed in Red team folder.

## 2018-07-27 NOTE — Telephone Encounter (Signed)
Placed in MDs box. Deseree Blount, CMA  

## 2018-07-27 NOTE — Progress Notes (Signed)
Subjective:    Patient ID: Jenny [name], female    DOB: 1961-03-24, 57 y.o.   MRN: [id number]  HPI Chief Complaint  Patient presents with  . Sleep Consult    Referred by Dr. Burr Medico for pulmonary HTN and possible OSA.Denies ever having a SS before.     57 year old obese African-American woman referred for evaluation of sleep disordered breathing. She was diagnosed with colon cancer in 2015 and underwent colectomy and chemotherapy.  This was unfortunately complicated by chemotherapy-induced neuropathy for which she takes Neurontin 3 times daily.  She reports excessive daytime somnolence going back to before her diagnosis of cancer, more than 4 years. She underwent imaging 7/1 with CT chest and abdomen that showed an enlarged pulmonary artery by 3.7 cm and enlarged right lobe of her thyroid line 3.7 x 6.0 cm.  Hence the referral. She reports excessive daytime tiredness which she attributed to her work and wheezing 3 grandkids.  She has on short-term disability now. Epworth sleepiness score is 22 and she reports sleepiness while sitting and reading, watching TV, as a passenger in a car, lying down rest in the afternoons and even while driving when she has to pull over on several occasions. Bedtime is variable now that she is at home, as early as 9:51 PM, sleep latency can be up to 10 hours sometimes she is not able to fall asleep at all and then she will get out of bed and do various chores around the house until 2:58 AM.  She sleeps on her back with 2 pillows, denies orthopnea paroxysmal nocturnal dyspnea, sometimes stays in bed as late as noon when she is finally up burning with dryness of mouth with occasional headaches. She is gained about 20 pounds in the last 2 years. There is no history suggestive of cataplexy, sleep paralysis or parasomnias      Past Medical History:  Diagnosis Date  . Anemia   . Cancer [facility])    colon cancer   . Enlarged thyroid    Past Surgical History:    Procedure Laterality Date  . BOWEL RESECTION    . CERVICAL SPINE SURGERY  06/17/2012   C5-C7 ACDF  . CESAREAN SECTION    . PARTIAL HYSTERECTOMY    . PORT-A-CATH REMOVAL Left 07/28/2015   Procedure: REMOVAL PORT-A-CATH;  Surgeon: [name], MD;  Location: [facility];  Service: General;  Laterality: Left;  . PORTACATH PLACEMENT Left 12/22/2014   Procedure: INSERTION PORT-A-CATH LEFT SUBCLAVIAN;  Surgeon: [name], MD;  Location: [facility];  Service: General;  Laterality: Left;  . TONSILLECTOMY      Allergies  Allergen Reactions  . Other Rash    *Derma Bond*  . Shellfish Allergy Rash     Social History   Socioeconomic History  . Marital status: Married    Spouse name: Not on file  . Number of children: Not on file  . Years of education: Not on file  . Highest education level: Not on file  Occupational History  . Not on file  Social Needs  . Financial resource strain: Not on file  . Food insecurity:    Worry: Not on file    Inability: Not on file  . Transportation needs:    Medical: Not on file    Non-medical: Not on file  Tobacco Use  . Smoking status: Never Smoker  . Smokeless tobacco: Never Used  Substance and Sexual Activity  . Alcohol use: No  . Drug use: No  .  Sexual activity: Not on file  Lifestyle  . Physical activity:    Days per week: Not on file    Minutes per session: Not on file  . Stress: Not on file  Relationships  . Social connections:    Talks on phone: Not on file    Gets together: Not on file    Attends religious service: Not on file    Active member of club or organization: Not on file    Attends meetings of clubs or organizations: Not on file    Relationship status: Not on file  . Intimate partner violence:    Fear of current or ex partner: Not on file    Emotionally abused: Not on file    Physically abused: Not on file    Forced sexual activity: Not on file  Other Topics Concern  . Not on file  Social History Narrative  . Not on  file     Family History  Problem Relation Age of Onset  . Cancer Brother   . Cancer Brother   . Hypertension Mother   . Colon cancer Neg Hx     Review of Systems  Positive for shortness of breath with activity, weight gain, abdominal pain, nasal congestion, sadness of mood, joint stiffness  Constitutional: negative for anorexia, fevers and sweats  Eyes: negative for irritation, redness and visual disturbance  Ears, nose, mouth, throat, and face: negative for earaches, epistaxis, nasal congestion and sore throat  Respiratory: negative for cough,sputum and wheezing  Cardiovascular: negative for chest pain,  lower extremity edema, orthopnea, palpitations and syncope  Gastrointestinal: negative for abdominal pain, constipation, diarrhea, melena, nausea and vomiting  Genitourinary:negative for dysuria, frequency and hematuria  Hematologic/lymphatic: negative for bleeding, easy bruising and lymphadenopathy  Musculoskeletal:negative for arthralgias, muscle weakness  Neurological: negative for coordination problems, gait problems, headaches and weakness  Endocrine: negative for diabetic symptoms including polydipsia, polyuria and weight loss     Objective:   Physical Exam  Gen. Pleasant, obese, in no distress, normal affect ENT - no lesions, no post nasal drip, class 2-3 airway Neck: No JVD, no thyromegaly, no carotid bruits Lungs: no use of accessory muscles, no dullness to percussion, decreased without rales or rhonchi  Cardiovascular: Rhythm regular, heart sounds  normal, no murmurs or gallops, no peripheral edema Abdomen: soft and non-tender, no hepatosplenomegaly, BS normal. Musculoskeletal: No deformities, no cyanosis or clubbing Neuro:  alert, non focal, no tremors       Assessment & Plan:

## 2018-07-27 NOTE — Patient Instructions (Signed)
Schedule home sleep study.   

## 2018-07-27 NOTE — Assessment & Plan Note (Signed)
Schedule echocardiogram to assess

## 2018-07-27 NOTE — Assessment & Plan Note (Signed)
Given excessive daytime somnolence, narrow pharyngeal exam, witnessed apneas & loud snoring, obstructive sleep apnea is very likely & an overnight polysomnogram will be scheduled as a home study. The pathophysiology of obstructive sleep apnea , it's cardiovascular consequences & modes of treatment including CPAP were discused with the patient in detail & they evidenced understanding.  Pretest probability is intermediate to high.  She does not seem to have other cause of sleep fragmentation.  If she does not have significant sleep disordered breathing, then may have to pursue testing for narcolepsy

## 2018-07-28 NOTE — Telephone Encounter (Signed)
Pt informed that form was ready for pickup at the front desk.   Copy placed in batch scanning. Fleeger, Salome Spotted, CMA

## 2018-07-31 ENCOUNTER — Other Ambulatory Visit: Payer: Self-pay

## 2018-07-31 ENCOUNTER — Ambulatory Visit (HOSPITAL_COMMUNITY): Payer: BC Managed Care – PPO | Attending: Internal Medicine

## 2018-07-31 DIAGNOSIS — Q2579 Other congenital malformations of pulmonary artery: Secondary | ICD-10-CM | POA: Insufficient documentation

## 2018-07-31 DIAGNOSIS — I517 Cardiomegaly: Secondary | ICD-10-CM | POA: Diagnosis not present

## 2018-07-31 DIAGNOSIS — E119 Type 2 diabetes mellitus without complications: Secondary | ICD-10-CM | POA: Insufficient documentation

## 2018-07-31 DIAGNOSIS — I253 Aneurysm of heart: Secondary | ICD-10-CM | POA: Diagnosis not present

## 2018-07-31 DIAGNOSIS — Z85038 Personal history of other malignant neoplasm of large intestine: Secondary | ICD-10-CM | POA: Insufficient documentation

## 2018-07-31 DIAGNOSIS — I272 Pulmonary hypertension, unspecified: Secondary | ICD-10-CM | POA: Insufficient documentation

## 2018-07-31 DIAGNOSIS — E669 Obesity, unspecified: Secondary | ICD-10-CM | POA: Diagnosis not present

## 2018-08-03 ENCOUNTER — Other Ambulatory Visit: Payer: Self-pay | Admitting: Family Medicine

## 2018-08-03 DIAGNOSIS — F411 Generalized anxiety disorder: Secondary | ICD-10-CM

## 2018-08-04 ENCOUNTER — Other Ambulatory Visit: Payer: Self-pay

## 2018-08-04 ENCOUNTER — Ambulatory Visit (INDEPENDENT_AMBULATORY_CARE_PROVIDER_SITE_OTHER): Payer: BC Managed Care – PPO | Admitting: Family Medicine

## 2018-08-04 ENCOUNTER — Encounter: Payer: Self-pay | Admitting: Family Medicine

## 2018-08-04 DIAGNOSIS — E010 Iodine-deficiency related diffuse (endemic) goiter: Secondary | ICD-10-CM

## 2018-08-04 DIAGNOSIS — F4323 Adjustment disorder with mixed anxiety and depressed mood: Secondary | ICD-10-CM | POA: Diagnosis not present

## 2018-08-04 NOTE — Progress Notes (Signed)
Subjective  Jenny [name] is a 57 y.o. female is presenting with the following  DEPRESSION Feels is slowly better.  Has been cleaning different rooms (den) and sleeping less during the day.  Did follow up with pulmonary and has sleep study planned.  Taking sertraline without problems.   Weight is stable.  Her PHQ 9 does not seem to reflect her impressions above and she has no suicidal ideation   THYROMEGALY  Occasional choking episode but overall not bothersome unless she tries to eat fast.  No shortness of breath or wheezing   Chief Complaint noted Review of Symptoms - see HPI PMH - Smoking status noted.    Objective Vital Signs reviewed BP 112/72   Pulse 80   Temp (!) 97.5 F (36.4 C) (Oral)   Ht 5\' 4"  (1.626 m)   Wt 259 lb 9.6 oz (117.8 kg)   SpO2 95%   BMI 44.56 kg/m  Well groomed Good eye contact Jokes and laughs at times   Assessments/Plans  See after visit summary for details of patient instuctions  Adjustment disorder with mixed anxiety and depressed mood Overall seem better.  Hopefully addressing probable sleep apnea will help with fatigue  Criteria for improvement:  - Walking regularly - Continued weight loss - Numbers on PHQ9 are better - House will be cleaner - Get your Mammogram - Keep your Eye Doctor Appointment  - Using something for Sleep Apnea   If by November we are not there we will discuss referral     Thyromegaly Stable

## 2018-08-04 NOTE — Patient Instructions (Addendum)
Good to see you today!  Thanks for coming in.  We will know you are better - Walking regularly - Continued weight loss - Numbers on PHQ9 are better - House will be cleaner - Get your Mammogram - Keep your Eye Doctor Appointment  - Using something for Sleep Apnea   If by November we are not there we will discuss referral   Come back the end of September for a check

## 2018-08-04 NOTE — Assessment & Plan Note (Signed)
Stable

## 2018-08-04 NOTE — Assessment & Plan Note (Addendum)
Overall seem better.  Hopefully addressing probable sleep apnea will help with fatigue  Criteria for improvement:  - Walking regularly - Continued weight loss - Numbers on PHQ9 are better - House will be cleaner - Get your Mammogram - Keep your Eye Doctor Appointment  - Using something for Sleep Apnea   If by November we are not there we will discuss referral

## 2018-08-05 ENCOUNTER — Telehealth: Payer: Self-pay | Admitting: Pulmonary Disease

## 2018-08-05 NOTE — Telephone Encounter (Signed)
Notes recorded by Rigoberto Noel, MD on 08/01/2018 at 12:05 PM EDT No evidence of pulmonary hypertension on echo. Normal-sized pulmonary artery  Left message for patient to call back for results.

## 2018-08-05 NOTE — Telephone Encounter (Signed)
Message was closed by myself by accident.   Pt returned call and stated to me that the person who had left her a message to call her back was [place]. Cherina, please advise what it was that you were trying to call pt about. Thanks!

## 2018-08-05 NOTE — Telephone Encounter (Signed)
I checked to see if I could figure out who it was who called pt but was unable to see who had called pt but unable to see who had tried to call pt. Pt stated she had a message and was going to listen to the message and then call our office back after she listened to the message.

## 2018-08-08 ENCOUNTER — Other Ambulatory Visit: Payer: Self-pay | Admitting: Hematology

## 2018-08-12 ENCOUNTER — Ambulatory Visit (INDEPENDENT_AMBULATORY_CARE_PROVIDER_SITE_OTHER): Payer: BC Managed Care – PPO | Admitting: Licensed Clinical Social Worker

## 2018-08-12 DIAGNOSIS — F4323 Adjustment disorder with mixed anxiety and depressed mood: Secondary | ICD-10-CM

## 2018-08-12 NOTE — Progress Notes (Signed)
Type of Service: [place] F/U Visit Total time:30 minutes :  Interpreter:No.    Reason for follow-up: Continue brief intervention to assist patient with managing symptoms of depression, as well as managing stressors . Patient is pleasant, smiling and engaged in conversation.   Appearance:Neat ; Thought process: Coherent; Affect: Appropriate : No plan to harm self or others   Depression screen [facility] 2/9 08/12/2018 08/04/2018 07/01/2018  Decreased Interest 1 2 2   Down, Depressed, Hopeless 2 2 2   PHQ - 2 Score 3 4 4   Altered sleeping 1 2 -  Tired, decreased energy 1 3 -  Change in appetite 1 2 -  Feeling bad or failure about yourself  2 3 -  Trouble concentrating 2 2 -  Moving slowly or fidgety/restless 2 3 -  Suicidal thoughts 0 1 -  PHQ-9 Score 12 20 -  Difficult doing work/chores - Very difficult -  Some recent data might be hidden   Patient  is making progress towards goal.  States she is feeling better and has been able to accomplish goals established with PCP (cleaning house, exercise). This is also evident by PHQ-9 score today which have decreased from 20 to 12 . Update shared with PCP. Goals: Patient will  1. Continue to reduce symptoms of: anxiety, depression and stress ,  2. increase  ability BS:WHQPRF skills, self-management skills and stress reduction,  3. Increase motivation to adhere to plan of care Intervention: Motivational Interviewing and Behavioral Activation, Reflective listening, ; Problem-solving teaching/coping strategies   Issues discussed: Daily schedule ; Self-Care ;  Finding new normal ( [employer]);  Transition with grandchildren returning to school, Assessment:Patient continues to experience symptoms of depression. However she is making great progress with finding her new normal.   Plan:.  1. Patient will F/U with LCSW in 3 weeks 2. Behavioral recommendations: behavioral activation, read book 3. Referral: none at this time  Jenny Lanius, LCSW Licensed Clinical Social Worker [place]   [phone] 12:19 PM

## 2018-08-20 ENCOUNTER — Telehealth: Payer: Self-pay | Admitting: Family Medicine

## 2018-08-20 DIAGNOSIS — G4710 Hypersomnia, unspecified: Secondary | ICD-10-CM

## 2018-08-20 NOTE — Telephone Encounter (Signed)
Short term disability form dropped off for at front desk for completion.  Verified that patient section of form has been completed.  Last DOS/ with PCP was 08/04/18.  Placed form in  Red team folder to be completed by clinical staff.  Carmina Miller

## 2018-08-20 NOTE — Telephone Encounter (Signed)
Reviewed Disability form and placed in PCP's box for completion. No other action required by clinical staff.  Jenny [name], [place]

## 2018-08-21 ENCOUNTER — Other Ambulatory Visit: Payer: Self-pay | Admitting: *Deleted

## 2018-08-21 DIAGNOSIS — G4710 Hypersomnia, unspecified: Secondary | ICD-10-CM

## 2018-08-25 ENCOUNTER — Other Ambulatory Visit: Payer: Self-pay | Admitting: Family Medicine

## 2018-08-26 ENCOUNTER — Telehealth: Payer: Self-pay | Admitting: Pulmonary Disease

## 2018-08-26 NOTE — Telephone Encounter (Signed)
Dr. Ander Slade has reviewed the home sleep test this test was negative for sleep apnea.  Dr . Ander Slade recommendation are working on sleep hygiene. Getting an adequate number of hours of sleep nightly.   Medication need to be reviewed with your doctor that maybe contributing to you day time sleepiness.  Weight loss measure. If concrens for narcolepsy, though symptoms you are having are not topical symptoms, an MSLT may be considered for further evaluation.     ATC left message will call back.

## 2018-08-27 NOTE — Telephone Encounter (Signed)
Form was in Therapist, sports box. Called patient and told her that form available for pick up at front desk. Copy made for batch scanning. Danley Danker, RN [facility] RN)

## 2018-08-27 NOTE — Telephone Encounter (Signed)
Called patient Confirmed she is taking 2 300 mg caps three times a day She is feeling pretty well

## 2018-08-27 NOTE — Telephone Encounter (Signed)
Pt called back, relayed results/recs to pt.  Pt expressed understanding.  Nothing further needed.

## 2018-09-08 ENCOUNTER — Ambulatory Visit: Payer: BC Managed Care – PPO

## 2018-09-11 ENCOUNTER — Ambulatory Visit (INDEPENDENT_AMBULATORY_CARE_PROVIDER_SITE_OTHER): Payer: BC Managed Care – PPO | Admitting: Licensed Clinical Social Worker

## 2018-09-11 DIAGNOSIS — F329 Major depressive disorder, single episode, unspecified: Secondary | ICD-10-CM

## 2018-09-11 DIAGNOSIS — F32A Depression, unspecified: Secondary | ICD-10-CM

## 2018-09-17 ENCOUNTER — Telehealth: Payer: Self-pay | Admitting: Family Medicine

## 2018-09-17 DIAGNOSIS — F32A Depression, unspecified: Secondary | ICD-10-CM | POA: Insufficient documentation

## 2018-09-17 DIAGNOSIS — F329 Major depressive disorder, single episode, unspecified: Secondary | ICD-10-CM | POA: Insufficient documentation

## 2018-09-17 NOTE — Telephone Encounter (Signed)
Short term disability form dropped off for at front desk for completion.  Verified that patient section of form has been completed.  Last DOS/WCC with PCP was 08/04/18.  Placed form in team folder to be completed by clinical staff.  Crista Luria

## 2018-09-17 NOTE — Assessment & Plan Note (Signed)
Patient continues to experience symptoms of depression, she has maintained PHQ-9 score of 12 since last visit and has been able to accomplish basic daily task. Patient continues to have difficulty with balancing multi task and becomes easily frustrated and overwhelmed with managing multi task.  Patient may benefit from, and is in agreement to continue further therapeutic interventions to assist with managing her symptoms. She is also in agreement for MD and LCSW to consult with psychiatry via [facility].

## 2018-09-17 NOTE — BH Specialist Note (Signed)
Type of Service: [place] F/U Visit Total time:40 minutes :  Interpreter:No.    Reason for follow-up: Continue brief intervention to assist patient with managing symptoms of anxiety and depression. Reports somewhat difficulty in daily functions.  Appearance:Neat ; Thought process: Coherent; Affect: Appropriate: No plan to harm self or others Depression screen [facility] 2/9 09/11/2018 08/12/2018 08/04/2018  Decreased Interest 1 1 2   Down, Depressed, Hopeless 2 2 2   PHQ - 2 Score 3 3 4   Altered sleeping 1 1 2   Tired, decreased energy 2 1 3   Change in appetite 1 1 2   Feeling bad or failure about yourself  2 2 3   Trouble concentrating 2 2 2   Moving slowly or fidgety/restless 1 2 3   Suicidal thoughts 0 0 1  PHQ-9 Score 12 12 20   Difficult doing work/chores Somewhat difficult - Very difficult  Some recent data might be hidden   Goals: Patient will  1. reduce symptoms of: anxiety, depression and mood instability ,  2. increase  ability ID:HWYSHU skills, self-management skills and stress reduction,  3. Increase healthy adjustment to current life circumstances. 4. Work on healthy eating habits Intervention: Engineer, building services, Problem-solving teaching/coping strategies, Psychoeducation and Consult MD   Issues discussed: things patient have been able to acoomplish  ; update on integrated care changes ;  Managing change; finding her new normal ; goals for next 3 weeks Assessment:Patient continues to experience symptoms of depression, she has maintained PHQ-9 score of 12 since last visit and has been able to accomplish basic daily task. Patient continues to have difficulty with balancing multi task and becomes easily frustrated and overwhelmed with managing multi task. Patient may benefit from, and is in agreement to continue further therapeutic interventions to assist with managing her symptoms. She is also in agreement for MD and LCSW to consult with  psychiatry via [facility].  Plan:Will consult with PCP for ongoing treatment. Patient does not want to be referred out for ongoing therapy due to progress being made with [facility] 1. Patient will F/U with LCSW in 2 to 3 weeks 2. Behavioral recommendations: continue Behavior Activation, take medication per PCP 3. Referral: consult with PCP and psychiatry via [facility].  [name], LCSW Licensed Clinical Social Worker [place]   [phone] 4:17 PM

## 2018-09-18 NOTE — Telephone Encounter (Signed)
Reviewed short term disability form and placed in PCP's box for completion.  Jenny [name], Jenny [name]

## 2018-09-23 ENCOUNTER — Other Ambulatory Visit: Payer: Self-pay

## 2018-09-23 ENCOUNTER — Other Ambulatory Visit: Payer: Self-pay | Admitting: Family Medicine

## 2018-09-23 ENCOUNTER — Encounter: Payer: Self-pay | Admitting: Family Medicine

## 2018-09-23 ENCOUNTER — Ambulatory Visit (INDEPENDENT_AMBULATORY_CARE_PROVIDER_SITE_OTHER): Payer: BC Managed Care – PPO | Admitting: Licensed Clinical Social Worker

## 2018-09-23 ENCOUNTER — Ambulatory Visit (INDEPENDENT_AMBULATORY_CARE_PROVIDER_SITE_OTHER): Payer: BC Managed Care – PPO | Admitting: Family Medicine

## 2018-09-23 VITALS — BP 130/82 | HR 72 | Temp 98.0°F | Ht 64.0 in | Wt 251.2 lb

## 2018-09-23 DIAGNOSIS — F4323 Adjustment disorder with mixed anxiety and depressed mood: Secondary | ICD-10-CM

## 2018-09-23 DIAGNOSIS — Z23 Encounter for immunization: Secondary | ICD-10-CM

## 2018-09-23 DIAGNOSIS — N6320 Unspecified lump in the left breast, unspecified quadrant: Secondary | ICD-10-CM

## 2018-09-23 DIAGNOSIS — E119 Type 2 diabetes mellitus without complications: Secondary | ICD-10-CM | POA: Diagnosis not present

## 2018-09-23 DIAGNOSIS — E1165 Type 2 diabetes mellitus with hyperglycemia: Secondary | ICD-10-CM | POA: Diagnosis not present

## 2018-09-23 LAB — POCT GLYCOSYLATED HEMOGLOBIN (HGB A1C): HbA1c, POC (controlled diabetic range): 12.7 % — AB (ref 0.0–7.0)

## 2018-09-23 LAB — GLUCOSE, POCT (MANUAL RESULT ENTRY): POC Glucose: 455 mg/dl — AB (ref 70–99)

## 2018-09-23 MED ORDER — METFORMIN HCL 500 MG PO TABS: 500.0000 mg | ORAL_TABLET | Freq: Two times a day (BID) | ORAL | 1 refills | Status: DC

## 2018-09-23 NOTE — Progress Notes (Signed)
Subjective  Jenny [name] is a 57 y.o. female is presenting with the following  DIABETES Disease Monitoring: Blood Sugar ranges(Severity) -not checking  Associated Symptoms- Polyuria/phagia/dipsia- Feeling very thirsty and drinking a lot for the last few weeks      Visual problems- yes vision is blurry Medications: Compliance(Modifying factor) - has been diet controlled.  No sudden changes Hypoglycemic symptoms- no Timing - continuous  ANXIETY Thinks is improving.  Walking and cleaning and making appts.  Taking medication without problems.  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  02/25/2018 144 134 - 144 mmol/L Final  06/18/2017 142 136 - 145 mEq/L Final   Creatinine  Date Value Ref Range Status  06/18/2017 0.8 0.6 - 1.1 mg/dL Final   Creatinine, Ser  Date Value Ref Range Status  06/22/2018 0.88 0.44 - 1.00 mg/dL Final           Chief Complaint noted Review of Symptoms - see HPI PMH - Smoking status noted.    Objective Vital Signs reviewed BP 130/82   Pulse 72   Temp 98 F (36.7 C) (Oral)   Ht 5\' 4"  (1.626 m)   Wt 251 lb 3.2 oz (113.9 kg)   SpO2 95%   BMI 43.12 kg/m  Awake alert interactive  Assessments/Plans  See after visit summary for details of patient instuctions  Poorly controlled diabetes mellitus ([place]) Her diabetes which had been diet controlled is now uncontrolled.  She is well hydrated and in no distress so  will start with metformin and decrease sweet drinks and monitor closely   Adjustment disorder with mixed anxiety and depressed mood Improved.  Continue current therapy

## 2018-09-23 NOTE — Assessment & Plan Note (Signed)
Improved. Continue current therapy.

## 2018-09-23 NOTE — Assessment & Plan Note (Signed)
Her diabetes which had been diet controlled is now uncontrolled.  She is well hydrated and in no distress so will start with metformin and decrease sweet drinks and monitor closely

## 2018-09-23 NOTE — BH Specialist Note (Signed)
  Type of Service: [place]  F/U Visit Total time:30 minutes :  Interpreter:No.    Reason for follow-up: Continue brief intervention to assist patient with managing symptoms of anxiety and depression . Patient reports somewhat difficulty in daily functions, however has been able to complete task at home and recently started walking 2 to 3 times per week.  Appearance:Well Groomed ; Thought process: Coherent; Affect: Appropriate: No plan to harm self or others  Patient is making progress towards goal.  States she is starting to feel better. This is also evident by PHQ-9 score which continues to decrease. Depression screen [facility] 2/9 09/23/2018 09/11/2018 08/12/2018 08/04/2018 07/01/2018  Decreased Interest 0 1 1 2 2   Down, Depressed, Hopeless 1 2 2 2 2   PHQ - 2 Score 1 3 3 4 4   Altered sleeping 0 1 1 2  -  Tired, decreased energy 1 2 1 3  -  Change in appetite 0 1 1 2  -  Feeling bad or failure about yourself  1 2 2 3  -  Trouble concentrating 0 2 2 2  -  Moving slowly or fidgety/restless 0 1 2 3  -  Suicidal thoughts - 0 0 1 -  PHQ-9 Score 3 12 12 20  -  Difficult doing work/chores Somewhat difficult Somewhat difficult - Very difficult -  Some recent data might be hidden  Goals: Patient will  1. reduce symptoms of: anxiety, depression and stress ,  2. increase  ability WU:JWJXBJ skills and self-management skills,  3. Adjust to life changes. Intervention: Motivational Interviewing, Solution-Focused Strategies and Behavioral Activation,     Issues discussed: maintaing progress with symptoms  ; managing stress ;  New short term goal. Assessment:Patient experiencing minimal symptoms of depression today.  She is making progress towards her goals  Patient may benefit from, and is in agreement to continue brief therapeutic interventions to assist with managing her symptoms.  Plan:  1. F/U with [facility] in 2 weeks 2. Behavioral recommendations: behavioral activation, 3. Referral: none at this  time  [name], LCSW Licensed Clinical Social Worker [place]   [phone] 11:46 AM

## 2018-09-23 NOTE — Patient Instructions (Addendum)
  Criteria for improvement:  - Walking regularly - Continued weight loss - Numbers on PHQ9 are better - House will be cleaner - Get your Mammogram - Keep your Eye Doctor Appointment   Start metformin - one tablet in the AM for 3 days then one tab twice a day  Drink nonsweet drinks and decrease sweet foods  If you get sick or are unable to keep fluids down the call us immediately  Come back in one month or sooner if the dry mouth is not better

## 2018-09-23 NOTE — Telephone Encounter (Signed)
Form completed in office visit

## 2018-09-25 ENCOUNTER — Telehealth: Payer: Self-pay | Admitting: *Deleted

## 2018-09-25 ENCOUNTER — Ambulatory Visit
Admission: RE | Admit: 2018-09-25 | Discharge: 2018-09-25 | Disposition: A | Discharge status: Home / Self Care | Payer: BC Managed Care – PPO | Source: Ambulatory Visit | Attending: Family Medicine | Admitting: Family Medicine

## 2018-09-25 DIAGNOSIS — N6320 Unspecified lump in the left breast, unspecified quadrant: Secondary | ICD-10-CM

## 2018-09-25 NOTE — Telephone Encounter (Signed)
Pt Jenny [name] asking for a call back ( no other info).  Attempted to call pt back.  No answer, Jenny [name] for callback. Fleeger, Salome Spotted, CMA

## 2018-09-30 ENCOUNTER — Ambulatory Visit: Payer: BC Managed Care – PPO

## 2018-10-07 ENCOUNTER — Ambulatory Visit: Payer: BC Managed Care – PPO

## 2018-10-08 ENCOUNTER — Telehealth: Payer: Self-pay | Admitting: Family Medicine

## 2018-10-08 DIAGNOSIS — E119 Type 2 diabetes mellitus without complications: Secondary | ICD-10-CM

## 2018-10-08 MED ORDER — METFORMIN HCL 500 MG PO TABS: 1000.0000 mg | ORAL_TABLET | Freq: Two times a day (BID) | ORAL | 1 refills | Status: DC

## 2018-10-08 NOTE — Telephone Encounter (Signed)
Left message I was just calling to check in   Spoke with her.  She is feeling better less thirst and urination.  Has improved diet Taking metformin 500 mg twice a day Asked to increase to 2 tabs twice a day = 1,000 mg twice a day To call us if any changes She agrees

## 2018-10-14 ENCOUNTER — Ambulatory Visit (INDEPENDENT_AMBULATORY_CARE_PROVIDER_SITE_OTHER): Payer: BC Managed Care – PPO | Admitting: Licensed Clinical Social Worker

## 2018-10-14 ENCOUNTER — Ambulatory Visit: Payer: BC Managed Care – PPO

## 2018-10-14 DIAGNOSIS — F329 Major depressive disorder, single episode, unspecified: Secondary | ICD-10-CM | POA: Diagnosis not present

## 2018-10-14 DIAGNOSIS — R4589 Other symptoms and signs involving emotional state: Secondary | ICD-10-CM

## 2018-10-14 NOTE — BH Specialist Note (Signed)
Integrated Behavioral Health Follow Up Visit  MRN: [id number] Name: Jenny [name]  Number of [place] Clinician visits:  Session Start time: 10:30  Session End time: 11:15 Total time: 45 minutes  Reason for follow-up: Continue brief intervention to assist patient with managing symptoms of anxiety and depression, as well as managing stressors . somewhat difficulty in daily functions. Appearance:Well Groomed ; Thought process: Coherent; Affect: Appropriate: No plan to harm self or others Changes in sleep, reports getting up at 2:00AM to clean the house as she is unable to do it during the day also noticed she is easily frustrated.  Patient reports missing several days of her medication last week. Patient is making progress towards goal however reports having good days and not so good days. This is also evident by increase in PHQ-9 and appears more anxious today.  Depression screen [facility] 2/9 10/14/2018 09/23/2018 09/11/2018  Decreased Interest 1 0 1  Down, Depressed, Hopeless 1 1 2   PHQ - 2 Score 2 1 3   Altered sleeping 1 0 1  Tired, decreased energy 1 1 2   Change in appetite 2 0 1  Feeling bad or failure about yourself  1 1 2   Trouble concentrating 1 0 2  Moving slowly or fidgety/restless 1 0 1  Suicidal thoughts 0 - 0  PHQ-9 Score 9 3 12   Difficult doing work/chores Somewhat difficult Somewhat difficult Somewhat difficult  Some recent data might be hidden    GAD 7 : Generalized Anxiety Score 10/14/2018 07/01/2018 06/24/2018 05/28/2018  Nervous, Anxious, on Edge 2 2 3 3   Control/stop worrying 1 1 2 3   Worry too much - different things 1 2 3 3   Trouble relaxing 1 1 3 3   Restless 1 1 3 3   Easily annoyed or irritable 1 2 3 2   Afraid - awful might happen 1 2 2 2   Total GAD 7 Score 8 11 19 19   Anxiety Difficulty Somewhat difficult - - Extremely difficult  Goals: Patient will continue to  1. reduce symptoms of: depression ,  2. increase  ability TK:WIOXBD skills,  self-management skills and stress reduction,  3. Take medication as prescribed by PCP Intervention: Motivational Interviewing, Solution-Focused Strategies and Sleep Hygiene, Problem-solving teaching/coping strategies and Psychoeducation on importance of taking medication. Assessment:Patient continues to experience symptoms of depression. Visible difference in patient, seems more anxious than usual.  She is making progress with completing house related task but still seams to have difficulty with balancing home management chores and other actives. Patient may benefit from, and is in agreement to continue ongoing assessment and brief therapeutic interventions to assist with managing her symptoms.  Plan: 1. Patient will F/U with [facility] in 2 weeks after visit with PCP 2. Behavioral recommendations: continue Behavioral activation, using calendar to plan chores, out of house one day per week 3. Referral: Psychiatrist consultation. Spoke with patient about consultation with Dr. Modesta Messing to review her chart for any recommendations. Patient provided verbal approval for consultation. 4. LCSW will F/U with patient in 5 to 7 days to assess how she is doing with taking med, and sleep.  Sleep: Decreased need for sleep: No Insomnia: Yes  Substance Use Substance Abuse or Alcohol: No   DUI: No    Abuse: History or Current violence towards others:  No  History or Current intimate partner violence or domestic violence:  No  Physical, sexual or emotional abuse: No   Psychiatric History (Ask if any previous diagnosis/treatment) Past Psychiatric History/Hospitalization(s): No  Anxiety:  Yes  Bipolar Disorder: No  Depression: Yes  Euphoria: No  Mania: No  Psychosis: No  Schizophrenia: No  Personality Disorder: No   Auditory and/or Visual Hallucinations: No  Self Injurious behaviors: No  Suicide Ideations and/or Homicidal Ideations: No  Prior Suicide Attempts: No   Prior or current outpatient mental health  therapy: Yes  at [facility] History of Electroconvulsive Shock Therapy: No  Hospitalization for psychiatric illness: No    Family History: Family History of mental illness: No  Family History of substance abuse: No   [name], [facility]   [phone] 4:36 PM

## 2018-10-14 NOTE — Patient Instructions (Signed)
It was a pleasure seeing you today.  Please review your notes from today with specific goals. Today we discussed your goals and various options to assist you with accomplishing them.   Please review the sleep hygiene information, contact Hassan Rowan below for start of next class.  Follow up with me in 2 weeks see appointment.   Monday Evenings Beginning October 14 - November 02, 2018 6:00 - 8:00 pm [facility]  Room [address] [place]. [place]  for Support  Finding Your New Normal [facility]) is a survivorship program to support cancer patients in reclaiming their lives after treatment.  These series of classes for both men and women help support the individual's transformation from "patient" to "survivor" by focusing on alternative ways to improve self-care and well-being.  For more information or to register, call Ottis Stain at [phone] or email [email or web address]

## 2018-10-15 ENCOUNTER — Other Ambulatory Visit: Payer: Self-pay | Admitting: Family Medicine

## 2018-10-15 DIAGNOSIS — F411 Generalized anxiety disorder: Secondary | ICD-10-CM

## 2018-10-15 DIAGNOSIS — E119 Type 2 diabetes mellitus without complications: Secondary | ICD-10-CM

## 2018-10-15 NOTE — Telephone Encounter (Signed)
Called and left message asking her to clarify metformin dose.  Refill is for 500 bid and chart indicates 1,000 bid.  Hold off on signing until dose clarified.

## 2018-10-16 MED ORDER — SERTRALINE HCL 100 MG PO TABS: 100.0000 mg | ORAL_TABLET | Freq: Every day | ORAL | 0 refills | Status: DC

## 2018-10-16 MED ORDER — METFORMIN HCL 1000 MG PO TABS: 1000.0000 mg | ORAL_TABLET | Freq: Two times a day (BID) | ORAL | 3 refills | Status: DC

## 2018-10-16 NOTE — Telephone Encounter (Signed)
Verified that she is taking 1,000 mg daily.  She also asked for a refill on her zoloft.

## 2018-10-22 ENCOUNTER — Telehealth: Payer: Self-pay | Admitting: Licensed Clinical Social Worker

## 2018-10-22 ENCOUNTER — Telehealth: Payer: Self-pay | Admitting: Family Medicine

## 2018-10-22 NOTE — Telephone Encounter (Signed)
Short term disablity form dropped off at front desk for completion.  Verified that patient section of form has been completed.  Last DOS/WCC with PCP was 09/23/2018.  Placed form in team folder to be completed by clinical staff.  Jenny [name]

## 2018-10-22 NOTE — Progress Notes (Signed)
Service : [place] F/U Call   F/U call to patient. Reports she is feeling better. Continues to take medication as prescribed by PCP states noticed she is much better with medication, not as aggitated and frustrated.  Discussed importance of taking medication daily and other options for when church is going on a fast.  Patient expressed understanding and share a plan that will work for her that does not involve stopping her medication.   Intervention: Solution-Focused Strategies and Supportive Counseling as well as Psychoeducation   Plan: [facility] will meet with patient on Nov. 6th .  [name], [place]   [phone] 11:38 AM

## 2018-10-22 NOTE — Telephone Encounter (Signed)
Reviewed disability from and placed in PCP's box for completion.  No clinical action needed.  Jenny [name], [place]

## 2018-10-27 ENCOUNTER — Telehealth: Payer: Self-pay | Admitting: Family Medicine

## 2018-10-27 NOTE — Telephone Encounter (Signed)
Attempted to contact pt to remind them of their upcoming appts tomorrow on 10/28/2018. -[place]

## 2018-10-28 ENCOUNTER — Ambulatory Visit (INDEPENDENT_AMBULATORY_CARE_PROVIDER_SITE_OTHER): Payer: BC Managed Care – PPO | Admitting: Licensed Clinical Social Worker

## 2018-10-28 ENCOUNTER — Ambulatory Visit: Payer: BC Managed Care – PPO | Admitting: Family Medicine

## 2018-10-28 ENCOUNTER — Encounter: Payer: Self-pay | Admitting: Family Medicine

## 2018-10-28 VITALS — BP 126/70 | HR 70 | Temp 98.0°F | Wt 249.4 lb

## 2018-10-28 DIAGNOSIS — F329 Major depressive disorder, single episode, unspecified: Secondary | ICD-10-CM

## 2018-10-28 DIAGNOSIS — E1165 Type 2 diabetes mellitus with hyperglycemia: Secondary | ICD-10-CM | POA: Diagnosis not present

## 2018-10-28 DIAGNOSIS — E119 Type 2 diabetes mellitus without complications: Secondary | ICD-10-CM | POA: Diagnosis not present

## 2018-10-28 DIAGNOSIS — F32A Depression, unspecified: Secondary | ICD-10-CM

## 2018-10-28 LAB — GLUCOSE, POCT (MANUAL RESULT ENTRY): POC Glucose: 263 mg/dl — AB (ref 70–99)

## 2018-10-28 NOTE — Telephone Encounter (Signed)
Completed and given to patient today

## 2018-10-28 NOTE — Assessment & Plan Note (Signed)
Still not well controlled.  Encouraged to get a pill box to make sure does not miss doses and to continue weight loss.

## 2018-10-28 NOTE — Assessment & Plan Note (Signed)
Seems improved despite not as good a PHQ9score.  Continue current medication and seeing IC.   See after visit summary for goals

## 2018-10-28 NOTE — Patient Instructions (Addendum)
Good to see you today!  Thanks for coming in.  For the Diabetes - aim to lose about 2 lbs a week - take the metformin twice a day every day - Use a pill box - exercise every day - go to the diabetes support classes  For the Depression Anxiety   We will know you are better - Walking regularly - Continued weight loss - Numbers on PHQ9 are better - House will be cleaner - Get your Mammogram - Keep your Eye Doctor Appointment - on 11/18 - Figure out what work you want to do  Come  Back in one month

## 2018-10-28 NOTE — BH Specialist Note (Signed)
Integrated Behavioral Health Follow Up Visit  MRN: [id number] Name: Jenny [name] Jenny [name]  Session Start time: 10:05  Session End time: 10:35 Total time: 30 minutes  Reason for follow-up: Continue brief intervention to assist patient with managing symptoms of anxiety and depression, as well as managing stressors .  Report of symptoms: trouble relaxing, easily annoyed, trouble with focus and concentrating, feeling bad.   ASSESSMENT:  Affect: Appropriate;Thought process: Coherent;  No plan to harm self or others. Patient is pleasant well groomed and engaged in conversation. Significant improvement in patient's interaction and positive outlook today verses last appointment. States overall she is feeling a little better. PHQ-9 & GAD scores remain about the same with minimal change since last visit. Patient reports taking zoloft daily, she has shown improvement with managing household chores and is now open to setting goals outside of her home. Patient she may benefit from, and is in agreement connect with groups to start behavioral activation.  GOALS:Patient will: 1. Reduce symptoms of: anxiety and depression 2. Increase knowledge and/or ability of: coping skills and self-management skills  3.   Work on healthy eating and exercise  PLAN:  1.Patient will F/U with [facility] when she returns for appointment with PCP 2.Behavioral recommendations: Behavioral Activation  3. Contact community resource group on information provided. 4. Referral: none at this time Intervention: Behavioral Activation and Supportive Counseling, Reflective listening, psycho education and consult with PCP to provide update. Marland Kitchen   GAD 7 : Generalized Anxiety Score 10/28/2018 10/14/2018 07/01/2018 06/24/2018  Nervous, Anxious, on Edge 1 2 2 3   Control/stop worrying 1 1 1 2   Worry too much - different things 1 1 2 3   Trouble relaxing 2 1 1 3   Restless 1 1 1 3   Easily annoyed or irritable 2 1 2 3   Afraid - awful might happen 1 1 2 2   Total  GAD 7 Score 9 8 11 19   Anxiety Difficulty - Somewhat difficult - -   Depression screen [facility] 2/9 10/28/2018 10/14/2018 09/23/2018  Decreased Interest 1 1 0  Down, Depressed, Hopeless 1 1 1   PHQ - 2 Score 2 2 1   Altered sleeping 1 1 0  Tired, decreased energy 1 1 1   Change in appetite 1 2 0  Feeling bad or failure about yourself  2 1 1   Trouble concentrating 2 1 0  Moving slowly or fidgety/restless 1 1 0  Suicidal thoughts 0 0 -  PHQ-9 Score 10 9 3   Difficult doing work/chores - Somewhat difficult Somewhat difficult  Some recent data might be hidden   Jenny [name], [name]   [phone] 10:47 AM

## 2018-10-28 NOTE — Progress Notes (Signed)
Subjective  Jenny [name] is a 57 y.o. female is presenting with the following   DEPRESSION She feels she is doing better despite a worse score on her PHQ9.  Has been meeting her goals (see after visit summary) No suicidal ideation More energy, exercising  DIABETES Disease Monitoring: Blood Sugar ranges(Severity) -not checking  Associated Symptoms- Polyuria/phagia/dipsia- much improved      Visual problems- no Medications: Compliance(Modifying factor) - metformin twice a day "most days" Hypoglycemic symptoms- no.  Has lost 2 lbs  Timing - continuous  Monitoring Labs and Parameters Last A1C:  Lab Results  Component Value Date   HGBA1C 12.7 (A) 09/23/2018   Last Lipid:     Component Value Date/Time   CHOL 150 12/25/2016 1157   HDL 41 (L) 12/25/2016 1157   Last Bmet  Potassium  Date Value Ref Range Status  06/22/2018 3.9 3.5 - 5.1 mmol/L Final  06/18/2017 4.1 3.5 - 5.1 mEq/L Final   Sodium  Date Value Ref Range Status  06/22/2018 137 135 - 145 mmol/L Final    Comment:    Please note reference intervals were recently updated.  02/25/2018 144 134 - 144 mmol/L Final  06/18/2017 142 136 - 145 mEq/L Final   Creatinine  Date Value Ref Range Status  06/18/2017 0.8 0.6 - 1.1 mg/dL Final   Creatinine, Ser  Date Value Ref Range Status  06/22/2018 0.88 0.44 - 1.00 mg/dL Final      Chief Complaint noted Review of Symptoms - see HPI PMH - Smoking status noted.    Objective Vital Signs reviewed BP 126/70 (BP Location: Left Arm, Patient Position: Sitting, Cuff Size: Large)   Pulse 70   Temp 98 F (36.7 C) (Oral)   Wt 249 lb 6.4 oz (113.1 kg)   SpO2 98%   BMI 42.81 kg/m  Psych:  Cognition and judgment appear intact. Alert, communicative  and cooperative with normal attention span and concentration. No apparent delusions, illusions, hallucinations  Assessments/Plans  See after visit summary for details of patient instuctions  Depression Seems improved despite not as  good a PHQ9score.  Continue current medication and seeing IC.   See after visit summary for goals   Poorly controlled diabetes mellitus ([place]) Still not well controlled.  Encouraged to get a pill box to make sure does not miss doses and to continue weight loss.

## 2018-10-31 ENCOUNTER — Other Ambulatory Visit: Payer: Self-pay | Admitting: Family Medicine

## 2018-10-31 DIAGNOSIS — E119 Type 2 diabetes mellitus without complications: Secondary | ICD-10-CM

## 2018-11-23 ENCOUNTER — Telehealth: Payer: Self-pay | Admitting: Family Medicine

## 2018-11-23 NOTE — Telephone Encounter (Signed)
Disability form dropped off for at front desk for completion.  Verified that patient section of form has been completed.  Last DOS with PCP was 10/28/18. Placed form in  Red team folder to be completed by clinical staff.  Jenny [name]

## 2018-11-23 NOTE — Telephone Encounter (Signed)
Reviewed disability  Form and placed in PCP's box for completion.  Jenny [name], [place]

## 2018-11-25 ENCOUNTER — Telehealth: Payer: Self-pay | Admitting: Licensed Clinical Social Worker

## 2018-11-25 ENCOUNTER — Ambulatory Visit: Payer: BC Managed Care – PPO | Admitting: Family Medicine

## 2018-11-25 ENCOUNTER — Telehealth (HOSPITAL_COMMUNITY): Payer: Self-pay | Admitting: Psychiatry

## 2018-11-25 ENCOUNTER — Ambulatory Visit (INDEPENDENT_AMBULATORY_CARE_PROVIDER_SITE_OTHER): Payer: BC Managed Care – PPO | Admitting: Licensed Clinical Social Worker

## 2018-11-25 VITALS — BP 130/70 | HR 71 | Temp 97.7°F | Wt 249.2 lb

## 2018-11-25 DIAGNOSIS — F411 Generalized anxiety disorder: Secondary | ICD-10-CM

## 2018-11-25 DIAGNOSIS — E119 Type 2 diabetes mellitus without complications: Secondary | ICD-10-CM | POA: Diagnosis not present

## 2018-11-25 DIAGNOSIS — F329 Major depressive disorder, single episode, unspecified: Secondary | ICD-10-CM | POA: Diagnosis not present

## 2018-11-25 DIAGNOSIS — F32A Depression, unspecified: Secondary | ICD-10-CM

## 2018-11-25 DIAGNOSIS — E1165 Type 2 diabetes mellitus with hyperglycemia: Secondary | ICD-10-CM

## 2018-11-25 LAB — POCT GLYCOSYLATED HEMOGLOBIN (HGB A1C): HbA1c, POC (controlled diabetic range): 10.7 % — AB (ref 0.0–7.0)

## 2018-11-25 MED ORDER — SERTRALINE HCL 100 MG PO TABS: 150.0000 mg | ORAL_TABLET | Freq: Every day | ORAL | 1 refills | Status: DC

## 2018-11-25 NOTE — Telephone Encounter (Signed)
[facility]) Psychiatric Consultant Case Review Brief Note  Summary Jenny [name] is a 57 y.o. year old female with history of depression, diabetes, sigmoid colon cancer, stage IIIB adenocarcinoma, s/p chemotherapy, partial resection, peripheral neuropathy after chemotherapy. Significant worsening in depression on today's visit while PHQ 9 score is moderate. She was found to be tearful, lethargic on evaluation by [facility] specialist. Passive SI, but no safety concern.   Medication- on sertraline 100 mg at least for a few months.  Other medical issues- She is under evaluation of sleep apnea by pulmonologist.   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 She takes care of her three grandchildren at home.   Assessment/Provisional Diagnosis # MDD Will uptitrate sertraline to target residual mood symptoms. She may try Wellbutrin as adjunctive treatment for depression in the future if she has limited benefit from uptitration of sertraline.   Recommendation - Increase sertraline 150 mg daily  - Consider adding Wellbutrin 150 mg daily in the future (side effects including anxiety, headache. Contraindicated if she has history of seizure) - [name] specialist to continue to follow up with the patient; weekly. She will benefit from behavioral activation. Will explore psychosocial stressors.   Thank you for your consult. We will continue to follow the patient. Please contact [name]  for any questions or concerns.   The above treatment considerations and suggestions are based on consultation with the [name] and/or PCP and a review of information available in the shared registry and the patient's [name] ([name]). I have not personally examined the patient. All recommendations should be implemented  with consideration of the patient's relevant prior history and current clinical status. Please feel free to call me with any questions about the care of this patient.

## 2018-11-25 NOTE — Assessment & Plan Note (Signed)
Has worsened since last visit.  Jenny [name] will set up interview with psychiatry.  Will not adjust medications since this will take place soon.  I did fill out her work disability for the next 2-3 months.   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

## 2018-11-25 NOTE — Patient Instructions (Addendum)
Good to see you today!  Thanks for coming in.  For the Depression Your psychiatrist will take over your care and fill out any disability forms  For the Diabetes  - use the pill box to take metformin twice a day every day - Adjust your diet - consider a Vegan diet - exercise 30-60 minutes stepping - daily  Come back in one month for a diabetes test

## 2018-11-25 NOTE — Progress Notes (Signed)
Subjective  Jenny [name] is a 57 y.o. female is presenting with the following  DEPRESSION She feels she is doing worse.  Not as motivated.  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  Taking sertraline but misses several doses a week as she falls asleep.  Was exercising regularly but stopped last week.  See [facility] note for details and PHQ9  DIABETES Disease Monitoring: Blood Sugar ranges(Severity) -not checking has not gotten monitor  Associated Symptoms- Polyuria/phagia/dipsia- improved      Visual problems- improved - Did see eye doctor exam was good Medications: Compliance(Modifying factor) - admits to missing at least 2-4 doses a week of metformin Hypoglycemic symptoms- no.   Exercise - see above  Timing - continuous  Monitoring Labs and Parameters Last A1C:  Lab Results  Component Value Date   HGBA1C 10.7 (A) 11/25/2018   Last Lipid:     Component Value Date/Time   CHOL 150 12/25/2016 1157   HDL 41 (L) 12/25/2016 1157   Last Bmet  Potassium  Date Value Ref Range Status  06/22/2018 3.9 3.5 - 5.1 mmol/L Final  06/18/2017 4.1 3.5 - 5.1 mEq/L Final   Sodium  Date Value Ref Range Status  06/22/2018 137 135 - 145 mmol/L Final    Comment:    Please note reference intervals were recently updated.  02/25/2018 144 134 - 144 mmol/L Final  06/18/2017 142 136 - 145 mEq/L Final   Creatinine  Date Value Ref Range Status  06/18/2017 0.8 0.6 - 1.1 mg/dL Final   Creatinine, Ser  Date Value Ref Range Status  06/22/2018 0.88 0.44 - 1.00 mg/dL Final           Chief Complaint noted Review of Symptoms - see HPI PMH - Smoking status noted.    Objective Vital Signs reviewed BP 130/70   Pulse 71   Temp 97.7 F (36.5 C) (Oral)   Wt 249 lb 3.2 oz (113 kg)   SpO2 98%   BMI 42.78 kg/m  Psych:  Cognition and judgment appear intact. Alert, communicative  and cooperative with normal attention span and concentration. No apparent  delusions, illusions, hallucinations.  Does appear more subdued than last visit   Assessments/Plans  See after visit summary for details of patient instuctions  Depression Has worsened since last visit.  Jenny [name] will set up interview with psychiatry.  Will not adjust medications since this will take place soon.  I did fill out her work disability for the next 2-3 months.   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    Poorly controlled diabetes mellitus ([place]) Not at goal.  Interim (2 mo) A1c shows improvement but still very high.  Discussed how she can take her metformin regularly.  Come back in 1 month.  Will likely start second medication then

## 2018-11-25 NOTE — Assessment & Plan Note (Signed)
Not at goal.  Interim (2 mo) A1c shows improvement but still very high.  Discussed how she can take her metformin regularly.  Come back in 1 month.  Will likely start second medication then

## 2018-11-25 NOTE — Progress Notes (Signed)
F/U to patient to provide update on consultation withDr. Modesta Messing.  Recommendation is to increase medication. Informed her Dr. Erin Hearing would call in new Rx for 150mg  of Zoloft.     Plan: Patient will take medication in the AM and F/U with LCSW on 12/04/18.  [name], [place]   [phone] 4:13 PM

## 2018-11-25 NOTE — Addendum Note (Signed)
Addended by: Talbert Cage L on: 11/25/2018 05:23 PM   Modules accepted: Orders

## 2018-11-25 NOTE — Progress Notes (Signed)
Su 
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
## 2018-11-26 ENCOUNTER — Ambulatory Visit: Payer: Self-pay | Admitting: Licensed Clinical Social Worker

## 2018-11-26 NOTE — BH Specialist Note (Addendum)
A user error has taken place: encounter opened in error, closed for administrative reasons.

## 2018-12-04 ENCOUNTER — Telehealth: Payer: Self-pay | Admitting: Family Medicine

## 2018-12-04 ENCOUNTER — Ambulatory Visit (INDEPENDENT_AMBULATORY_CARE_PROVIDER_SITE_OTHER): Payer: BC Managed Care – PPO | Admitting: Licensed Clinical Social Worker

## 2018-12-04 DIAGNOSIS — F329 Major depressive disorder, single episode, unspecified: Secondary | ICD-10-CM

## 2018-12-04 DIAGNOSIS — F32A Depression, unspecified: Secondary | ICD-10-CM

## 2018-12-04 NOTE — Telephone Encounter (Signed)
Disability form dropped off for at front desk for completion.  Verified that patient section of form has been completed.  Last DOS/WCC with PCP was 11/25/18.  Placed form in team folder to be completed by clinical staff.  Crista Luria

## 2018-12-04 NOTE — BH Specialist Note (Signed)
Integrated Behavioral Health Follow Up Visit  MRN: [id number] Name: Jenny [name] [facility]  Session Start time: 10:40  Session End time: 11:30 Total time: 50 minutes  Reason for follow-up: Continue brief intervention to assist patient with managing symptoms of depression, as well as managing stressors .  Report of symptoms: feeling anxious, nervious inside, with little energy, trouble concentrating, feeling down,  Worrying and difficulty in daily functions. Reports no new symptoms.    ASSESSMENT: Mood: Anxious at time and Affect: Appropriate;Thought process: Coherent;  Suicidal ideation No plan to harm self or others. Has been able to redirect her thoughts. Patient is taking 150mg  sertraline daily for the past week with no side effects. States she feels about the same however have noticed she is able to cook for family and do housework. No change in PHQ-9 score and continued symptom of depression. Patient may benefit from, and is in agreement to continue ongoing assessment and brief therapeutic interventions to assist with managing her symptoms.  GOALS:Patient will: 1. Reduce symptoms of: anxiety and depression 2. Increase knowledge and/or ability of: coping skills, self-management skills and stress reduction  PLAN:  1.Patient will F/U with [facility] in Jan. When she returns to see PCP.  Will call if changes in behavior 2.Behavioral recommendations: will continue behavioral activation  3. Take medication daily as pre PCP 4. Referral: Will continue to consult with psychiatry, Dr. Modesta Messing  Intervention: Motivational Interviewing, Solution-Focused Strategies and Supportive Counseling;Brief CBT and Psychoeducation . Update provided to PCP and Dr. Modesta Messing.  Depression screen [facility] 2/9 12/04/2018 11/25/2018 10/28/2018  Decreased Interest 1 1 1   Down, Depressed, Hopeless 2 3 1   PHQ - 2 Score 3 4 2   Altered sleeping 2 2 1   Tired, decreased energy 3 3 1   Change in appetite 1 1 1   Feeling bad or failure about  yourself  1 2 2   Trouble concentrating 2 1 2   Moving slowly or fidgety/restless 1 0 1  Suicidal thoughts 1 1 0  PHQ-9 Score 14 14 10   Difficult doing work/chores Somewhat difficult Very difficult -  Some recent data might be hidden   [name], [place]   [phone] 1:48 PM

## 2018-12-04 NOTE — Telephone Encounter (Signed)
Placed in MDs box to be filled out. Jenny [name], CMA  

## 2018-12-09 ENCOUNTER — Ambulatory Visit: Payer: Self-pay | Admitting: Licensed Clinical Social Worker

## 2018-12-09 ENCOUNTER — Ambulatory Visit: Payer: BC Managed Care – PPO | Admitting: Family Medicine

## 2018-12-09 DIAGNOSIS — F329 Major depressive disorder, single episode, unspecified: Secondary | ICD-10-CM

## 2018-12-09 DIAGNOSIS — F32A Depression, unspecified: Secondary | ICD-10-CM

## 2018-12-09 NOTE — Progress Notes (Signed)
(  late entry, created on 12/4)  Virtual behavioral Health Initiative ([place]) Psychiatric Consultant Case Review Brief Note  Summary Jenny [name] is a 57 y.o. year old female with history of depression, diabetes, sigmoid colon cancer, stage IIIB adenocarcinoma, s/p chemotherapy, partial resection, peripheral neuropathy after chemotherapy. Significant worsening in depression on today's visit while PHQ 9 score is moderate. She was found to be tearful, lethargic on evaluation by [facility] specialist. Passive SI, but no safety concern.   Medication- on sertraline 100 mg at least for a few months.  Other medical issues- She is under evaluation of sleep apnea by pulmonologist.   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 She takes care of her three grandchildren at home.   Assessment/Provisional Diagnosis # MDD Will uptitrate sertraline to target residual mood symptoms. She may try Wellbutrin as adjunctive treatment for depression in the future if she has limited benefit from uptitration of sertraline.   Recommendation - Increase sertraline 150 mg daily  - Consider adding Wellbutrin 150 mg daily in the future (side effects including anxiety, headache. Contraindicated if she has history of seizure) - [facility] specialist to continue to follow up with the patient; weekly. She will benefit from behavioral activation. Will explore psychosocial stressors.   Thank you for your consult. We will continue to follow the patient. Please contact [place]  for any questions or concerns.   The above treatment considerations and suggestions are based on consultation with the [facility] specialist and/or PCP and a review of information available in the shared registry and the patient's [place] Record ([name]). I have not personally examined the patient.  All recommendations should be implemented with consideration of the patient's relevant prior history and current clinical status. Please feel free to call me with any questions about the care of this patient.

## 2018-12-09 NOTE — Progress Notes (Signed)
Overall improvement in depression after uptitration of sertraline, based on evaluation by [facility] specialist. No change in medication recommendation. Will continue weekly therapy.

## 2018-12-09 NOTE — BH Specialist Note (Addendum)
Integrated Behavioral Health Treatment Planning Team  MRN: [id number] NAME: Jenny [name]  DATE: 12/09/18  Start time: 1:30 End time: 1:45 Total time: 15 minutes  Treatment Team Attendees: [name], Elie Goody [place] Presenting Problem/Current Symptoms: difficult concentrating,   Diagnoses: No diagnosis found.  Demographics/Context: AA female, lives with husband and 3 grandchildren.  School Teacher,  Medical History:  Past Medical History:  Diagnosis Date  . Anemia   . Cancer [facility])    colon cancer   . Enlarged thyroid    Labs:  Recent Results (from the past 2160 hour(s))  Glucose (CBG)     Status: Abnormal   Collection Time: 09/23/18 11:49 AM  Result Value Ref Range   POC Glucose 455 (A) 70 - 99 mg/dl  HgB A1c     Status: Abnormal   Collection Time: 09/23/18 11:49 AM  Result Value Ref Range   Hemoglobin A1C     HbA1c POC (<> result, manual entry)     HbA1c, POC (prediabetic range)     HbA1c, POC (controlled diabetic range) 12.7 (A) 0.0 - 7.0 %  Glucose (CBG)     Status: Abnormal   Collection Time: 10/28/18 10:24 AM  Result Value Ref Range   POC Glucose 263 (A) 70 - 99 mg/dl  HgB A1c     Status: Abnormal   Collection Time: 11/25/18 11:25 AM  Result Value Ref Range   Hemoglobin A1C     HbA1c POC (<> result, manual entry)     HbA1c, POC (prediabetic range)     HbA1c, POC (controlled diabetic range) 10.7 (A) 0.0 - 7.0 %    Procedures: none  Social History:  Social History   Socioeconomic History  . Marital status: Married    Spouse name: Not on file  . Number of children: Not on file  . Years of education: Not on file  . Highest education level: Not on file  Occupational History  . Not on file  Social Needs  . Financial resource strain: Not on file  . Food insecurity:    Worry: Not on file    Inability: Not on file  . Transportation needs:    Medical: Not on file    Non-medical: Not on file  Tobacco Use  . Smoking status: Never Smoker  .  Smokeless tobacco: Never Used  Substance and Sexual Activity  . Alcohol use: No  . Drug use: No  . Sexual activity: Not on file  Lifestyle  . Physical activity:    Days per week: Not on file    Minutes per session: Not on file  . Stress: Not on file  Relationships  . Social connections:    Talks on phone: Not on file    Gets together: Not on file    Attends religious service: Not on file    Active member of club or organization: Not on file    Attends meetings of clubs or organizations: Not on file    Relationship status: Not on file  Other Topics Concern  . Not on file  Social History Narrative  . Not on file   School/Education: college graduate   Psychiatric History: is currently in counseling Notable from MMSE: NA Previous Treatment: psycho therapy and pharmocotherapy  Treatment Barriers: not being consistent with taking medication in the past Strengths/Protective Factors: Supportive family, strong faith   Goals: Patient will: Increase motivation to adhere to plan of care Improve medication compliance and reduce symptoms of: anxiety depression and also Increase  knowledge and/or ability of:  coping skills self-management skills stress reduction  Interventions: Motivational Interviewing Solution-Focused Strategies Supportive Counseling  Standardized Assessments Completed: GAD-7 PHQ 9  Medication History: Current medications:  Outpatient Encounter Medications as of 12/09/2018  Medication Sig  . acetaminophen (TYLENOL) 500 MG tablet Take 500 mg by mouth every 6 (six) hours as needed.  . fluticasone (FLONASE) 50 MCG/ACT nasal spray Place 2 sprays into both nostrils daily.  Marland Kitchen gabapentin (NEURONTIN) 300 MG capsule Take 2 capsules (600 mg total) by mouth 3 (three) times daily.  . hydrocortisone cream 0.5 % Apply 1 application topically 2 (two) times daily as needed for itching.  . loratadine (CLARITIN) 10 MG tablet Take 10 mg by mouth daily. AS NEEDED  . metFORMIN  (GLUCOPHAGE) 1000 MG tablet Take 1 tablet (1,000 mg total) by mouth 2 (two) times daily with a meal.  . metFORMIN (GLUCOPHAGE) 1000 MG tablet Take 1 tablet (1,000 mg total) by mouth 2 (two) times daily with a meal. Please counsel patient on new dose form  Thanks  . Multiple Vitamin (MULTIVITAMIN) tablet Take 1 tablet by mouth daily.  . sertraline (ZOLOFT) 100 MG tablet Take 1.5 tablets (150 mg total) by mouth daily.   No facility-administered encounter medications on file as of 12/09/2018.    Psychotropic Medication Management:  1. Medication: Zoloft  Indication:   Date started: 06/02/2018  Date(s) changed: 11/26/18  Taking as prescribed: Yes at times   Positive effects/relief of symptoms: symptoms continued with 100mg   Negative side effects: none   Team Recommendations: continue with 150mg  of Zoloft, Behavioral Activation and Brief CBT  Referral(s):Continue with [facility] (In Clinic) Who else would benefit from hearing team recommendations? NA Follow up Appointments: Jan. 8th with PCP and [facility] Scribe for Treatment Team: Maurine Cane, [place], [place]   [phone] 3:43 PM

## 2018-12-11 ENCOUNTER — Telehealth: Payer: Self-pay | Admitting: Licensed Clinical Social Worker

## 2018-12-11 NOTE — Telephone Encounter (Signed)
Service : [place] F/U Call   F/U call to patient. Patient continues to take sertraline daily as per PCP and has not missed a dose.  Reports feeling like she wants to break down and cry but still pushing forward.  Has not been very successful with Behavioral Activation but will work on this over the [employer] . Patient appreciative of F/U call.  Intervention: Supportive Counseling, Reflective listening,  Psychoeducation    Plan: LCSW will meet with patient Jan. 8th   Deborah Moore, [place]   [phone] 10:45 AM

## 2018-12-28 ENCOUNTER — Inpatient Hospital Stay: Payer: BC Managed Care – PPO | Attending: Family Medicine

## 2018-12-28 ENCOUNTER — Telehealth: Payer: Self-pay | Admitting: Hematology

## 2018-12-28 ENCOUNTER — Encounter: Payer: Self-pay | Admitting: Hematology

## 2018-12-28 ENCOUNTER — Inpatient Hospital Stay (HOSPITAL_BASED_OUTPATIENT_CLINIC_OR_DEPARTMENT_OTHER): Payer: BC Managed Care – PPO | Admitting: Hematology

## 2018-12-28 VITALS — BP 117/71 | HR 67 | Temp 98.5°F | Resp 17 | Ht 64.0 in | Wt 249.1 lb

## 2018-12-28 DIAGNOSIS — D649 Anemia, unspecified: Secondary | ICD-10-CM

## 2018-12-28 DIAGNOSIS — I272 Pulmonary hypertension, unspecified: Secondary | ICD-10-CM

## 2018-12-28 DIAGNOSIS — R159 Full incontinence of feces: Secondary | ICD-10-CM

## 2018-12-28 DIAGNOSIS — R10819 Abdominal tenderness, unspecified site: Secondary | ICD-10-CM | POA: Insufficient documentation

## 2018-12-28 DIAGNOSIS — G620 Drug-induced polyneuropathy: Secondary | ICD-10-CM | POA: Diagnosis not present

## 2018-12-28 DIAGNOSIS — Z6841 Body Mass Index (BMI) 40.0 and over, adult: Secondary | ICD-10-CM

## 2018-12-28 DIAGNOSIS — C772 Secondary and unspecified malignant neoplasm of intra-abdominal lymph nodes: Secondary | ICD-10-CM

## 2018-12-28 DIAGNOSIS — Z7984 Long term (current) use of oral hypoglycemic drugs: Secondary | ICD-10-CM | POA: Insufficient documentation

## 2018-12-28 DIAGNOSIS — F419 Anxiety disorder, unspecified: Secondary | ICD-10-CM | POA: Insufficient documentation

## 2018-12-28 DIAGNOSIS — Z85038 Personal history of other malignant neoplasm of large intestine: Secondary | ICD-10-CM | POA: Diagnosis present

## 2018-12-28 DIAGNOSIS — R197 Diarrhea, unspecified: Secondary | ICD-10-CM | POA: Diagnosis not present

## 2018-12-28 DIAGNOSIS — Z79899 Other long term (current) drug therapy: Secondary | ICD-10-CM | POA: Diagnosis not present

## 2018-12-28 DIAGNOSIS — R5383 Other fatigue: Secondary | ICD-10-CM | POA: Diagnosis not present

## 2018-12-28 DIAGNOSIS — F329 Major depressive disorder, single episode, unspecified: Secondary | ICD-10-CM | POA: Diagnosis not present

## 2018-12-28 DIAGNOSIS — T451X5A Adverse effect of antineoplastic and immunosuppressive drugs, initial encounter: Secondary | ICD-10-CM

## 2018-12-28 DIAGNOSIS — E119 Type 2 diabetes mellitus without complications: Secondary | ICD-10-CM | POA: Diagnosis not present

## 2018-12-28 DIAGNOSIS — C187 Malignant neoplasm of sigmoid colon: Secondary | ICD-10-CM

## 2018-12-28 DIAGNOSIS — K760 Fatty (change of) liver, not elsewhere classified: Secondary | ICD-10-CM | POA: Insufficient documentation

## 2018-12-28 DIAGNOSIS — E669 Obesity, unspecified: Secondary | ICD-10-CM

## 2018-12-28 LAB — CBC WITH DIFFERENTIAL/PLATELET
Abs Immature Granulocytes: 0.02 10*3/uL (ref 0.00–0.07)
Basophils Absolute: 0 10*3/uL (ref 0.0–0.1)
Basophils Relative: 0 %
Eosinophils Absolute: 0.1 10*3/uL (ref 0.0–0.5)
Eosinophils Relative: 2 %
HCT: 36.7 % (ref 36.0–46.0)
Hemoglobin: 11.5 g/dL — ABNORMAL LOW (ref 12.0–15.0)
Immature Granulocytes: 0 %
Lymphocytes Relative: 43 %
Lymphs Abs: 2.6 10*3/uL (ref 0.7–4.0)
MCH: 25.9 pg — ABNORMAL LOW (ref 26.0–34.0)
MCHC: 31.3 g/dL (ref 30.0–36.0)
MCV: 82.7 fL (ref 80.0–100.0)
Monocytes Absolute: 0.2 10*3/uL (ref 0.1–1.0)
Monocytes Relative: 4 %
Neutro Abs: 3.1 10*3/uL (ref 1.7–7.7)
Neutrophils Relative %: 51 %
Platelets: 276 10*3/uL (ref 150–400)
RBC: 4.44 MIL/uL (ref 3.87–5.11)
RDW: 15.9 % — ABNORMAL HIGH (ref 11.5–15.5)
WBC: 6.1 10*3/uL (ref 4.0–10.5)
nRBC: 0 % (ref 0.0–0.2)

## 2018-12-28 LAB — COMPREHENSIVE METABOLIC PANEL
ALT: 21 U/L (ref 0–44)
AST: 16 U/L (ref 15–41)
Albumin: 3.9 g/dL (ref 3.5–5.0)
Alkaline Phosphatase: 70 U/L (ref 38–126)
Anion gap: 8 (ref 5–15)
BUN: 11 mg/dL (ref 6–20)
CO2: 26 mmol/L (ref 22–32)
Calcium: 9.5 mg/dL (ref 8.9–10.3)
Chloride: 109 mmol/L (ref 98–111)
Creatinine, Ser: 0.77 mg/dL (ref 0.44–1.00)
GFR calc Af Amer: >60 mL/min (ref >60)
GFR calc non Af Amer: >60 mL/min (ref >60)
Glucose, Bld: 101 mg/dL — ABNORMAL HIGH (ref 70–99)
Potassium: 3.9 mmol/L (ref 3.5–5.1)
Sodium: 143 mmol/L (ref 135–145)
Total Bilirubin: 0.4 mg/dL (ref 0.3–1.2)
Total Protein: 7.5 g/dL (ref 6.5–8.1)

## 2018-12-28 NOTE — Telephone Encounter (Signed)
Printed calendar and avs. °

## 2018-12-28 NOTE — Progress Notes (Signed)
[place]   [phone] Fax:([phone]) [phone]   Clinic Follow up Note   Patient Care Team: Lind Covert, MD as PCP - General Juanita Craver, MD as Consulting Physician (Gastroenterology) [name], MD as Consulting Physician (General Surgery) Truitt Merle, MD as Consulting Physician (Hematology)  Date of Service:  12/28/2018  CHIEF COMPLAINT: F/u of sigmoid colon cancer   SUMMARY OF ONCOLOGIC HISTORY: Oncology History   Cancer Staging Cancer of sigmoid colon metastatic to intra-abdominal lymph node [facility]) Staging form: Colon and Rectum, AJCC 7th Edition - Clinical: Stage IIIB (T3, N1a, M0) - Signed by Truitt Merle, MD on 12/28/2014 - Pathologic stage from 11/14/2014: Stage IIIB (T3, N1a, cM0) - Signed by Truitt Merle, MD on 06/29/2018       Cancer of sigmoid colon metastatic to intra-abdominal lymph node ([place])   10/10/2014 Imaging    CT abdomen and pelvis: Eccentric wall thickening along the sigmoid colon, worrisome for early colonic adenocarcinoma. No findings suspicious for metastatic disease. CT chest (-)       11/14/2014 Initial Diagnosis    Colon cancer    11/14/2014 Pathologic Stage    pT3pN1pMx, G2, tumor invading through the muscular propria into pericolonic fatty tissue.  LVI (-), PNI (-). 1/28 node positive, margins negative.     11/14/2014 Surgery    sigmoid colon segmental resection, margins (-).     11/14/2014 Cancer Staging    Staging form: Colon and Rectum, AJCC 7th Edition - Pathologic stage from 11/14/2014: Stage IIIB (T3, N1a, cM0) - Signed by Truitt Merle, MD on 06/29/2018    12/28/2014 - 05/17/2015 Adjuvant Chemotherapy    mFOLFOX6, 10% dose reduction due to neuropathy and cytopenia from cycle 5, oxaliplatin held from cycle 8 due to leg pain and neuropathy. chemo stopped after cycle 10 due to her tolerance issue.     12/29/2015 Imaging    CT chest, abdomen and pelvis with contrast showed no evidence of recurrence.    06/19/2017 Imaging      CT CAPw contrast IMPRESSION: No evidence recurrent or metastatic colon carcinoma.  Stable mild hepatic steatosis.  5.1 cm right thyroid lobe nodule measures larger in size compared to recent studies. Thyroid ultrasound recommended for further evaluation. This recommendation follows ACR consensus guidelines: Managing Incidental Thyroid Nodules Detected on Imaging: White Paper of the ACR Incidental Thyroid Findings Committee. J Am Coll Radiol 2015;12(2):143-150.  Aortic atherosclerosis.    07/10/2017 Pathology Results    Thyroid Biopsy 07/10/17 Diagnosis THYROID, FINE NEEDLE ASPIRATION, RIGHT INFERIOR, RLP (SPECIMEN 1 OF 2 COLLECTED 07-10-2017) CONSISTENT WITH BENIGN FOLLICULAR NODULE (BETHESDA CATEGORY II).     07/10/2017 Imaging    07/10/2017 [name] [facility] Thyroid IMPRESSION: 1. Technically successful ultrasound guided fine needle aspiration of right inferior thyroid nodule. 2. Technically successful ultrasound guided fine needle aspiration of left mid thyroid nodule.  07/10/2017 US Thyroid IMPRESSION: 6.3 cm right inferior TR 3 nodule meets criteria for biopsy. 1.8 cm left mid TR 4 nodule also meets criteria for biopsy.    06/22/2018 Imaging    06/22/2018 CT CAP W Contrast IMPRESSION: 1. No findings to suggest locally recurrent disease or metastatic disease in the chest, abdomen or pelvis. 2. Dilatation of the pulmonic trunk (3.7 cm in diameter), concerning for pulmonary arterial hypertension. 3. Persistent and increasing mass-like enlargement of the right lobe of the thyroid gland. Correlation with results of prior thyroid ultrasound and biopsy is recommended.      CURRENT THERAPY:  Surveillance   INTERVAL HISTORY:  Jenny [name] is here for a follow up of her sigmoid colon cancer. She was last seen by me 6 months ago. She presents to the clinic today by herself. She notes she is managing. She notes her neuropathy his mildly improved and still painful. This is mainly  in her legs, soles of feet and hands. She is taking 2 capsules TID which makes her drowsy but helps the pain. She is still seeing counselor which is helping her some. She is currently on Zoloft for depression. She is currently not working. She notes issues of stool and urinary incontinence to where she had episodes of accidents. She notes she will get diarrhea 203 times a day.  She notes she will not have appetite at sometimes, but she will mostly eat adequately. She mostly tries to eat better but will eat based on emotion. She plans to follow up with PCP this week to reviewed her DM. She notes light vaginal spotting.    REVIEW OF SYSTEMS:   Constitutional: Denies fevers, chills or abnormal weight loss (+) tiredness Eyes: Denies blurriness of vision Ears, nose, mouth, throat, and face: Denies mucositis or sore throat Respiratory: Denies cough, dyspnea or wheezes Cardiovascular: Denies palpitation, chest discomfort or lower extremity swelling Gastrointestinal:  Denies nausea, heartburn (+) stool incontinence (+) diarrhea  Skin: Denies abnormal skin rashes Lymphatics: Denies new lymphadenopathy or easy bruising Neurological:Denies new weaknesses (+) Mild improvement of neuropathy  Behavioral/Psych: Mood is stable, no new changes (+) mild improvement in depression  All other systems were reviewed with the patient and are negative.  MEDICAL HISTORY:  Past Medical History:  Diagnosis Date  . Anemia   . Cancer [facility])    colon cancer   . Enlarged thyroid     SURGICAL HISTORY: Past Surgical History:  Procedure Laterality Date  . BOWEL RESECTION    . CERVICAL SPINE SURGERY  06/17/2012   C5-C7 ACDF  . CESAREAN SECTION    . PARTIAL HYSTERECTOMY    . PORT-A-CATH REMOVAL Left 07/28/2015   Procedure: REMOVAL PORT-A-CATH;  Surgeon: [name], MD;  Location: [facility];  Service: General;  Laterality: Left;  . PORTACATH PLACEMENT Left 12/22/2014   Procedure: INSERTION PORT-A-CATH LEFT SUBCLAVIAN;   Surgeon: [name], MD;  Location: [facility];  Service: General;  Laterality: Left;  . TONSILLECTOMY      I have reviewed the social history and family history with the patient and they are unchanged from previous note.  ALLERGIES:  is allergic to other and shellfish allergy.  MEDICATIONS:  Current Outpatient Medications  Medication Sig Dispense Refill  . acetaminophen (TYLENOL) 500 MG tablet Take 500 mg by mouth every 6 (six) hours as needed.    . fluticasone (FLONASE) 50 MCG/ACT nasal spray Place 2 sprays into both nostrils daily. 16 g 2  . gabapentin (NEURONTIN) 300 MG capsule Take 2 capsules (600 mg total) by mouth 3 (three) times daily. 540 capsule 1  . hydrocortisone cream 0.5 % Apply 1 application topically 2 (two) times daily as needed for itching.    . loratadine (CLARITIN) 10 MG tablet Take 10 mg by mouth daily. AS NEEDED    . metFORMIN (GLUCOPHAGE) 1000 MG tablet Take 1 tablet (1,000 mg total) by mouth 2 (two) times daily with a meal. 180 tablet 3  . metFORMIN (GLUCOPHAGE) 1000 MG tablet Take 1 tablet (1,000 mg total) by mouth 2 (two) times daily with a meal. Please counsel patient on new dose form  Thanks 180 tablet 1  .  Multiple Vitamin (MULTIVITAMIN) tablet Take 1 tablet by mouth daily.    . sertraline (ZOLOFT) 100 MG tablet Take 1.5 tablets (150 mg total) by mouth daily. 135 tablet 1   No current facility-administered medications for this visit.     PHYSICAL EXAMINATION: ECOG PERFORMANCE STATUS: 2 - Symptomatic, <50% confined to bed  Vitals:   12/28/18 1531  BP: 117/71  Pulse: 67  Resp: 17  Temp: 98.5 F (36.9 C)  SpO2: 99%   Filed Weights   12/28/18 1531  Weight: 249 lb 1.6 oz (113 kg)    GENERAL:alert, no distress and comfortable SKIN: skin color, texture, turgor are normal, no rashes or significant lesions EYES: normal, Conjunctiva are pink and non-injected, sclera clear OROPHARYNX:no exudate, no erythema and lips, buccal mucosa, and tongue normal    NECK: supple, non-tender, without nodularity (+) enlarged thyroid LYMPH:  no palpable lymphadenopathy in the cervical, axillary or inguinal LUNGS: clear to auscultation and percussion with normal breathing effort HEART: regular rate & rhythm and no murmurs and no lower extremity edema ABDOMEN:abdomen soft and normal bowel sounds (+) Diffuse abdominal  Tenderness, mainly in epigastric and LLQ areas  Musculoskeletal:no cyanosis of digits and no clubbing  NEURO: alert & oriented x 3 with fluent speech, no focal motor/sensory deficits  LABORATORY DATA:  I have reviewed the data as listed CBC Latest Ref Rng & Units 12/28/2018 06/22/2018 02/25/2018  WBC 4.0 - 10.5 K/uL 6.1 6.4 4.9  Hemoglobin 12.0 - 15.0 g/dL 11.5(L) 12.4 12.3  Hematocrit 36.0 - 46.0 % 36.7 38.1 38.1  Platelets 150 - 400 K/uL 276 283 284     CMP Latest Ref Rng & Units 12/28/2018 06/22/2018 02/25/2018  Glucose 70 - 99 mg/dL 101(H) 285(H) 114(H)  BUN 6 - 20 mg/dL '11 14 12  '$ Creatinine 0.44 - 1.00 mg/dL 0.77 0.88 0.67  Sodium 135 - 145 mmol/L 143 137 144  Potassium 3.5 - 5.1 mmol/L 3.9 3.9 4.3  Chloride 98 - 111 mmol/L 109 104 105  CO2 22 - 32 mmol/L '26 26 22  '$ Calcium 8.9 - 10.3 mg/dL 9.5 10.0 9.7  Total Protein 6.5 - 8.1 g/dL 7.5 7.7 7.6  Total Bilirubin 0.3 - 1.2 mg/dL 0.4 0.3 0.4  Alkaline Phos 38 - 126 U/L 70 83 66  AST 15 - 41 U/L 16 12(L) 12  ALT 0 - 44 U/L '21 18 16      '$ RADIOGRAPHIC STUDIES: I have personally reviewed the radiological images as listed and agreed with the findings in the report. No results found.   ASSESSMENT & PLAN:  Jenny [name] is a 58 y.o. female with    1. Sigmoid colon adenocarcinoma, G2, pT3pN1aM0, stage IIIB, MSI stable  -She is was diagnosed in 10/2014. She is s/p sigmoid colon resection and adjuvant chemo mFOLFOX for 6 months  -She unfortunately developed significant peripheral neuropathy after chemo, she is currently off work, due to significant fatigue, depression, and the  neuropathy. -Continue cancer surveillance, no evidence of recurrence so far. -Labs reviewed, CBC and CMP are WNL except Hg at 11.5 and Glucose at 101. CEA is still pending. I advised her to start pre-natal Vitamin.  -She is 4 years out of her diagnosis now, we again discussed the risk of recurrence decreases significantly after the first 3 years.  -Continue cancer surveillance for another year. I do not plan to repeat surveillance CT scans.  -Repeat labs in 6 weeks and f/u in 6 months   2. Peripheral neuropathy, b/l leg  and body pain, secondary to chemo, G2-3 -She has developed significant peripheral neuropathy after chemotherapy, with bilateral leg and body pain, numbness and tingling of her fingers and toes. This worsened after her return to work.  -She has previously completed physical therapy and will continue exercise  -Given the possible drug interaction with Zoloft she did not try Cymbalta.  -She takes Neurontin '600mg'$  TID, Tylenol or ibuprofen as needed, she was previously on Vicodin once to twice  A week, off now -I discussed she will feel fatigued or drowsy from high dose Neurontin.   3. Obesity and  Hepatic steatosis -I encouraged her to follow-up with her primary care physician and check her lipid profile at least once a year. I again encouraged her to lose weight, by diet control and exercise -she has mild fatty liver on prior CT scan.   4. Diabetes -Only on Metformin, Monitored by PCP -Her HbA1c is 10.7 in 11/2018, not well controlled, I discussed her adding more medication to better control her DM. She is scheduled to see her PCP soon  -I encouraged her to watch her diet carefully and avoid fast foods and soft drinks.  -Glucose at 101 today (12/28/2018)  5. Depression and anxiety   -Continue on Zoloft as recently prescribed by PCP -She will maintain follow up with the SW for depression counseling her PCPs office -Depression has mildly improved, continue Zoloft   6. ?  Pulmonary hypertension -Her CT scan showed dilated pulmonary artery, concerning for pulmonary hypertension. -she saw Dr. Elsworth Soho. Her 08/20/18 Sleep study was negative for sleep apnea   7. Urinary and stool incontinence, Intermittent Diarrhea /Constipation  -I recommend Pelvic Floor PT to help her. She agreed.  -She has diffuse abdominal tenderness. I will send note to Dr. Collene Mares for follow up.   8 mild anemia, new  -Her hemoglobin 11.5 today, previous normal, in 12's  -I encouraged her to take a prenatal multivitamins.  I will repeat labs in 6 weeks with iron study -No clinical signs of bleeding.  Will refer her back to see GI Dr. Collene Mares.     Plan  Lab in 6 weeks Lab and f/u in 6 months    No problem-specific Assessment & Plan notes found for this encounter.   Orders Placed This Encounter  Procedures  . CEA (IN HOUSE-[facility])  . CEA (IN HOUSE-[facility])    Standing Status:   Standing    Number of Occurrences:   20    Standing Expiration Date:   12/29/2019  . Ferritin    Standing Status:   Future    Standing Expiration Date:   12/28/2019  . Iron and TIBC    Standing Status:   Future    Standing Expiration Date:   12/28/2019  . Ambulatory referral to Physical Therapy    Referral Priority:   Routine    Referral Type:   Physical Medicine    Referral Reason:   Specialty Services Required    Requested Specialty:   Physical Therapy    Number of Visits Requested:   1   All questions were answered. The patient knows to call the clinic with any problems, questions or concerns. No barriers to learning was detected. I spent 20 minutes counseling the patient face to face. The total time spent in the appointment was 25 minutes and more than 50% was on counseling and review of test results     Truitt Merle, MD 12/28/2018   I, Joslyn Devon, am acting as scribe  for Truitt Merle, MD.   I have reviewed the above documentation for accuracy and completeness, and I agree with the above.

## 2018-12-29 ENCOUNTER — Telehealth: Payer: Self-pay | Admitting: *Deleted

## 2018-12-29 LAB — CEA (IN HOUSE-CHCC): CEA (CHCC-In House): 1.05 ng/mL (ref 0.00–5.00)

## 2018-12-29 NOTE — Telephone Encounter (Signed)
Called Dr Collene Mares (Gastroenterologist) office [phone] and requested most recent colonoscopy results.  Pending receipt of fax

## 2018-12-30 ENCOUNTER — Ambulatory Visit (INDEPENDENT_AMBULATORY_CARE_PROVIDER_SITE_OTHER): Payer: BC Managed Care – PPO | Admitting: Family Medicine

## 2018-12-30 ENCOUNTER — Ambulatory Visit (INDEPENDENT_AMBULATORY_CARE_PROVIDER_SITE_OTHER): Payer: BC Managed Care – PPO | Admitting: Licensed Clinical Social Worker

## 2018-12-30 ENCOUNTER — Other Ambulatory Visit: Payer: Self-pay

## 2018-12-30 ENCOUNTER — Ambulatory Visit: Payer: BC Managed Care – PPO | Admitting: Family Medicine

## 2018-12-30 ENCOUNTER — Encounter: Payer: Self-pay | Admitting: Family Medicine

## 2018-12-30 VITALS — BP 130/82 | HR 62 | Temp 98.0°F | Ht 64.0 in | Wt 247.6 lb

## 2018-12-30 DIAGNOSIS — E1165 Type 2 diabetes mellitus with hyperglycemia: Secondary | ICD-10-CM

## 2018-12-30 DIAGNOSIS — F4323 Adjustment disorder with mixed anxiety and depressed mood: Secondary | ICD-10-CM

## 2018-12-30 LAB — POCT GLYCOSYLATED HEMOGLOBIN (HGB A1C): HbA1c, POC (controlled diabetic range): 8.9 % — AB (ref 0.0–7.0)

## 2018-12-30 NOTE — Assessment & Plan Note (Signed)
Improving with metformin and diet.  Not at goal.  Discussed approaches.  She would like to continue with weight loss and diet which has a chance to get her A1c below 8.   If not consider adding empaglaflozin

## 2018-12-30 NOTE — Assessment & Plan Note (Signed)
Seems to be slowly improving.   Will see IC in 2 weeks and tele-psych may consider adding supplement medication.  Did ask her to decrease her gabapentin to see if will help with fatigue.  She agrees to try

## 2018-12-30 NOTE — Patient Instructions (Signed)
Good to see you today!  Thanks for coming in.  You are doing better!  Your weight is down 2 lbs even over the holidays Your A1c is much improved - you are one point from your goal  For your Mood - Decrease gabapentin to 1 tab twice a day during the day and 2 at night - continue 1.5 tabs of sertraline daily - May add another medication in 2 weeks  Come back to see me in 6 weeks - will see how your weight is

## 2018-12-30 NOTE — Telephone Encounter (Signed)
Completed gave to patient

## 2018-12-30 NOTE — Progress Notes (Signed)
Subjective  Jenny [name] is a 58 y.o. female is presenting with the following  DEPRESSION Disease Monitoring Current symptoms include anhedonia, difficulty concentrating and fatigue            Symptoms have been gradually improving     Is Exercising not as much over the holidays but plans to  Evidence of suicidal ideation: no Medication Monitoring Compliance: taking as prescribed Decreased Libido no      Lightheadedness no     Insomnia no  GI symptoms no  DIABETES Disease Monitoring: Blood Sugar ranges(Severity) -not checking  Associated Symptoms- Polyuria/phagia/dipsia- no      Visual problems- no Medications: Compliance(Modifying factor) - Taking metformin now regularly, has lost 2 lbs over the holidays Hypoglycemic symptoms- no Timing - continuous  Monitoring Labs and Parameters Last A1C:  Lab Results  Component Value Date   HGBA1C 8.9 (A) 12/30/2018   Last Lipid:     Component Value Date/Time   CHOL 150 12/25/2016 1157   HDL 41 (L) 12/25/2016 1157   Last Bmet  Potassium  Date Value Ref Range Status  12/28/2018 3.9 3.5 - 5.1 mmol/L Final  06/18/2017 4.1 3.5 - 5.1 mEq/L Final   Sodium  Date Value Ref Range Status  12/28/2018 143 135 - 145 mmol/L Final  02/25/2018 144 134 - 144 mmol/L Final  06/18/2017 142 136 - 145 mEq/L Final   Creatinine  Date Value Ref Range Status  06/18/2017 0.8 0.6 - 1.1 mg/dL Final   Creatinine, Ser  Date Value Ref Range Status  12/28/2018 0.77 0.44 - 1.00 mg/dL Final     ROS - See HPI  Chief Complaint noted Review of Symptoms - see HPI PMH - Smoking status noted.    Objective Vital Signs reviewed BP 130/82   Pulse 62   Temp 98 F (36.7 C) (Oral)   Ht 5\' 4"  (1.626 m)   Wt 247 lb 9.6 oz (112.3 kg)   SpO2 98%   BMI 42.50 kg/m  Psych:  Cognition and judgment appear intact. Alert, communicative  and cooperative with normal attention span and concentration. No apparent delusions, illusions,  hallucinations  Assessments/Plans  See after visit summary for details of patient instuctions  Adjustment disorder with mixed anxiety and depressed mood Seems to be slowly improving.   Will see IC in 2 weeks and tele-psych may consider adding supplement medication.  Did ask her to decrease her gabapentin to see if will help with fatigue.  She agrees to try   Poorly controlled diabetes mellitus ([place]) Improving with metformin and diet.  Not at goal.  Discussed approaches.  She would like to continue with weight loss and diet which has a chance to get her A1c below 8.   If not consider adding empaglaflozin

## 2018-12-30 NOTE — Patient Instructions (Addendum)
It was a pleasure seeing you today.   Today we discussed your goals and various options to assist you with accomplishing them.   We also identified one step you will make towards each of these goals in the next two weeks.   1. Career  2. Finances  3. Healthy eating and Exercise  Follow up with me 2 weeks. Call if you need to see me or talk sooner.

## 2018-12-30 NOTE — BH Specialist Note (Signed)
Integrated Behavioral Health Follow Up Visit  MRN: [id number] Name: Jenny [name] Clinician visit: Session Start time: 8:30  Session End time: 9:15 Total time: 45 minutes  Reason for follow-up: Continue brief intervention to assist patient with managing symptoms of anxiety and depression, as well as managing stressors .  Report of symptoms: feeling tired ; low energy and difficulty in daily functions.  ASSESSMENT: Mood: Euthymic and Affect: Appropriate;Thought process: Coherent; No plans for SI. Patient is making progress towards goal.  States she is starting to feel a little better.  Reports her mind wants to do things however she is tired and has no energy. Disorganized at home and wants organization back in her life. PHQ-9 is about the same with slight degrease in GAD score. She believes the increase in medication is helping and is open to other adjustments by Dr. Modesta Messing.  Patient is well groomed and engaged in conversation.  We were able to have difficult convesations today around career options that we would not previously bee able to have. Patient continues to experience symptoms of depression and anxiety. Patient may benefit from, and is in agreement to continue ongoing assessment and brief therapeutic interventions to assist with managing her symptoms.  GOALS:Patient will: 1. Reduce symptoms of: anxiety and depression 2. Increase knowledge and/or ability of: coping skills, healthy habits and self-management skills  3.   Career (call [employer]) 4.   Eating Health and exercising  5. Financial plan   PLAN:  1.Patient will F/U with [facility] in 2 weeks 2.Behavioral recommendations: continue relaxed breathing; behavioral activation 3. Work on 1st step discussed for each goal  Intervention: Solution-Focused Strategies and Veterinary surgeon, Reflective listening, Behavioral Therapy (Relaxed breathing); Problem-solving teaching/coping strategies and Psychoeducation    GAD 7 : Generalized Anxiety Score 12/30/2018 11/25/2018 10/28/2018 10/14/2018  Nervous, Anxious, on Edge 2 3 1 2   Control/stop worrying 1 1 1 1   Worry too much - different things 1 2 1 1   Trouble relaxing 2 2 2 1   Restless 2 1 1 1   Easily annoyed or irritable 2 3 2 1   Afraid - awful might happen 1 1 1 1   Total GAD 7 Score 11 13 9 8   Anxiety Difficulty - Very difficult - Somewhat difficult   Depression screen [facility] 2/9 12/30/2018 12/04/2018 11/25/2018  Decreased Interest 1 1 1   Down, Depressed, Hopeless 2 2 3   PHQ - 2 Score 3 3 4   Altered sleeping 2 2 2   Tired, decreased energy 3 3 3   Change in appetite 1 1 1   Feeling bad or failure about yourself  1 1 2   Trouble concentrating 2 2 1   Moving slowly or fidgety/restless 2 1 0  Suicidal thoughts 1 1 1   PHQ-9 Score 15 14 14   Difficult doing work/chores Somewhat difficult Somewhat difficult Very difficult  Some recent data might be hidden   [facility]   [phone] 1:45 PM

## 2019-01-01 ENCOUNTER — Telehealth: Payer: Self-pay

## 2019-01-01 NOTE — Telephone Encounter (Signed)
Left voice message for patient per Dr. Burr Medico lab results, CMP and CEA were WNL, no concerns.

## 2019-01-01 NOTE — Telephone Encounter (Signed)
-----   Message from Truitt Merle, MD sent at 12/30/2018  7:19 AM EST ----- Please let pt know her lab results, CMP and CEA were WNL, no concerns, thanks   Truitt Merle  12/30/2018

## 2019-01-06 IMAGING — CT CT CHEST W/ CM
2 of 5 series · 13 of 36 positions shown, 16 images · IV contrast (ISOVUE 300)
Comparison: CT the chest, abdomen and pelvis 06/18/2017.

CLINICAL DATA: 56-year-old female with history of colon cancer
status post surgical resection and chemotherapy. Followup study.

EXAM:
CT CHEST, ABDOMEN, AND PELVIS WITH CONTRAST
TECHNIQUE: Multidetector CT imaging of the chest, abdomen and pelvis was
performed following the standard protocol during bolus
administration of intravenous contrast.
CONTRAST:  100mL [id number] IOPAMIDOL ([id number]) INJECTION 61%

[Series 2: cap with · axial · 0.84mm/px · z∈[+1318,+1823]mm · 10 of 125 slices shown, 13 images]
[im 12/125  mediastinal]
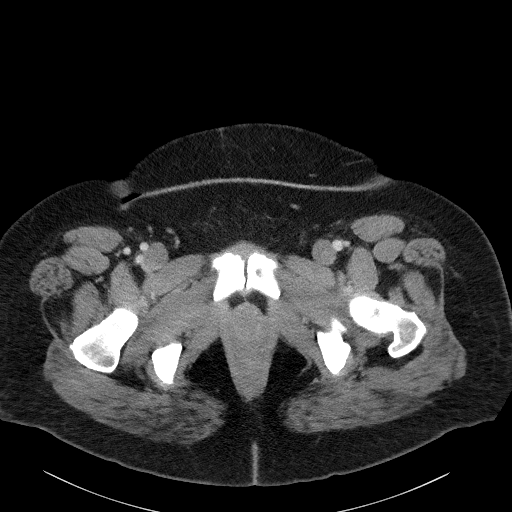
[im 12/125  lung]
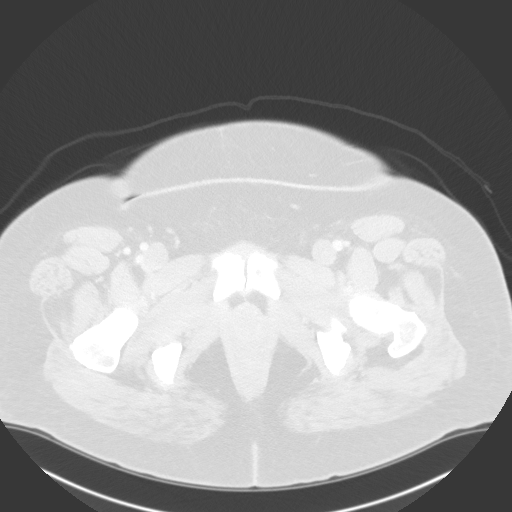
[im 23/125  lung]
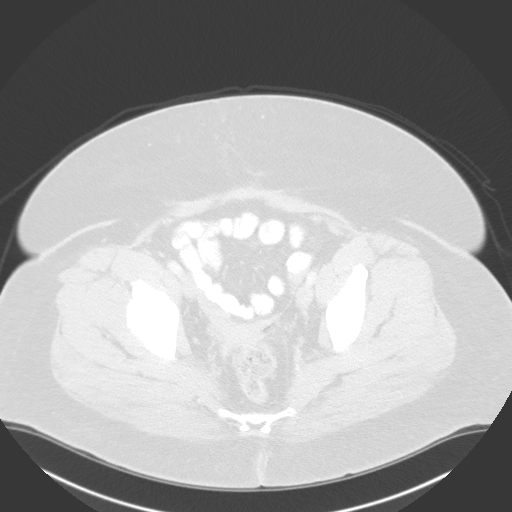
[im 34/125  lung]
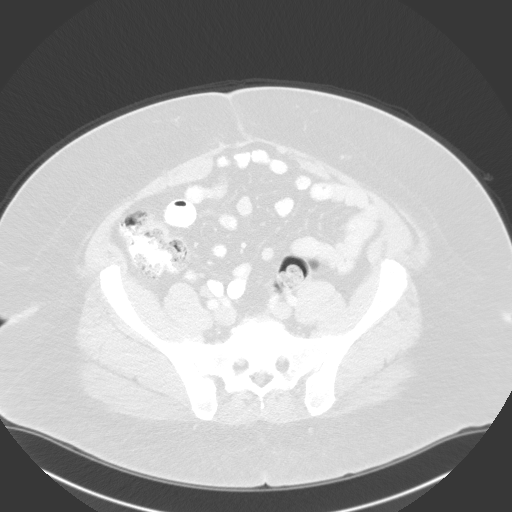
[im 46/125  lung]
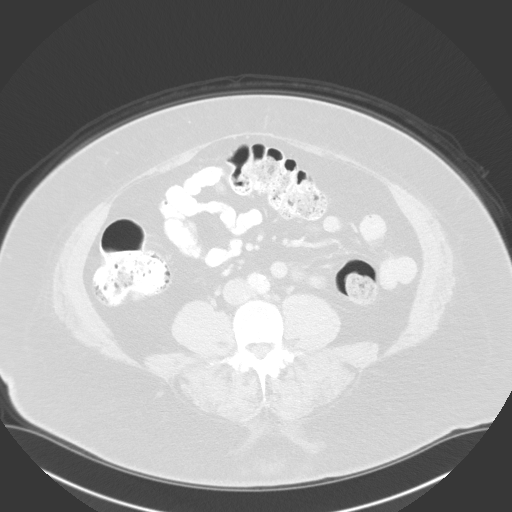
[im 57/125  mediastinal]
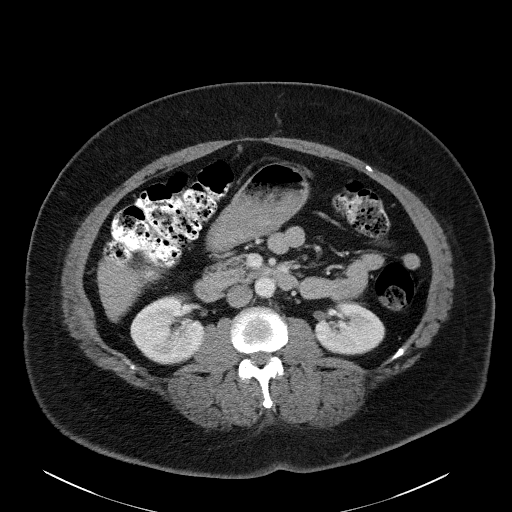
[im 57/125  lung]
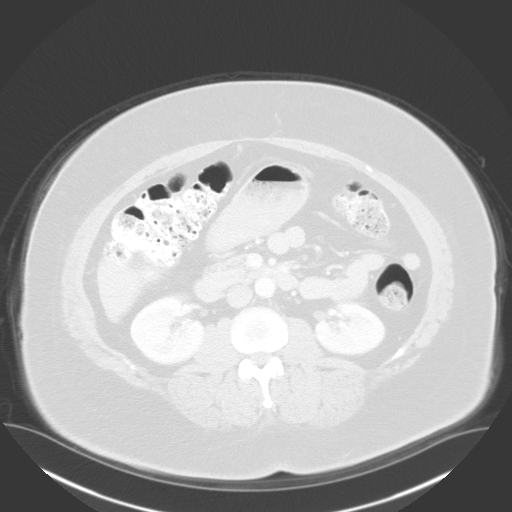
[im 68/125  lung]
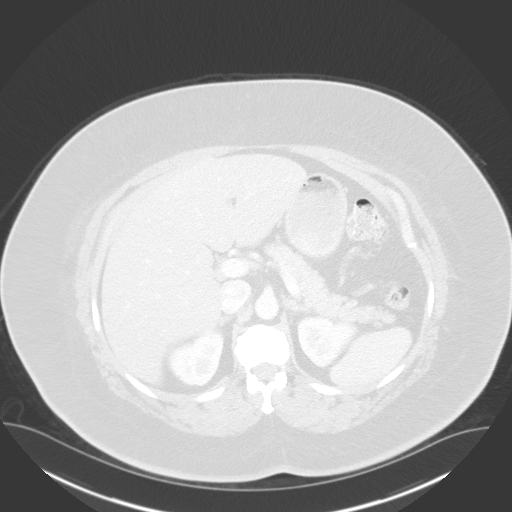
[im 79/125  lung]
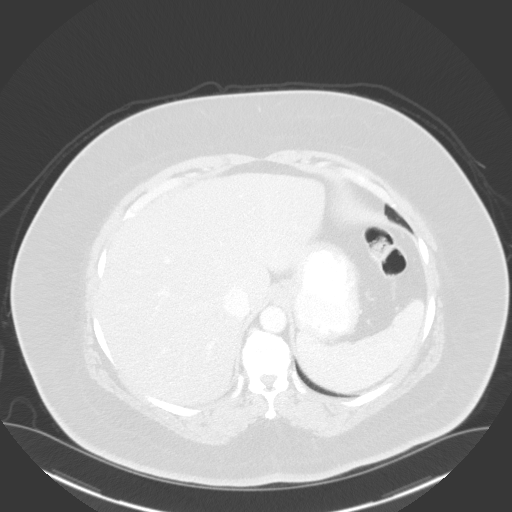
[im 91/125  lung]
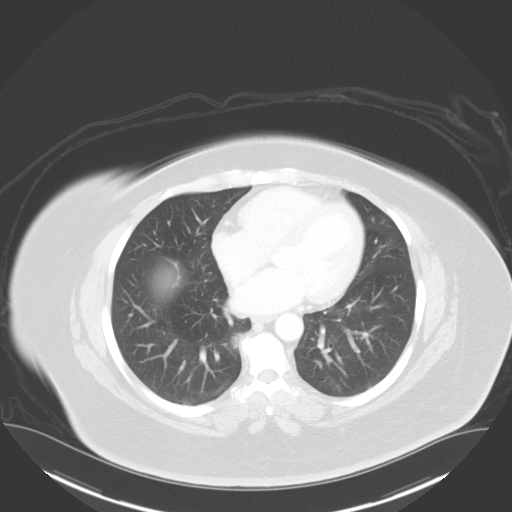
[im 102/125  mediastinal]
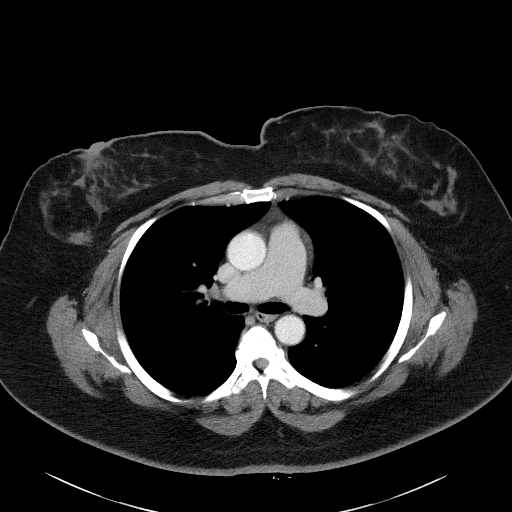
[im 102/125  lung]
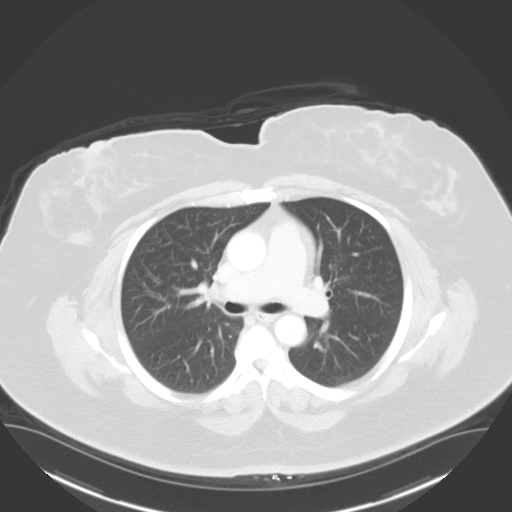
[im 113/125  lung]
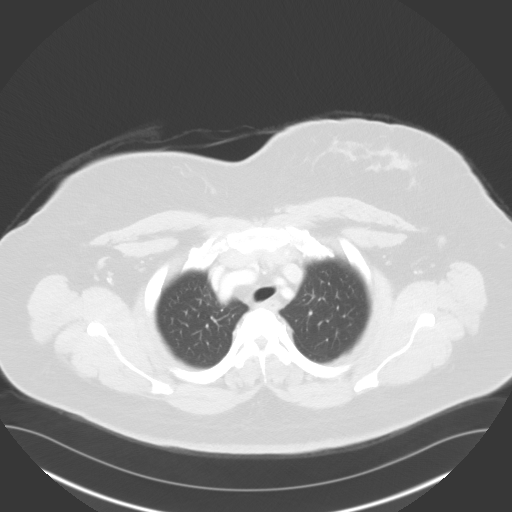

[Series 4: coronals · coronal · 0.90mm/px · 3 of 135 slices shown]
[im 27/135  lung]
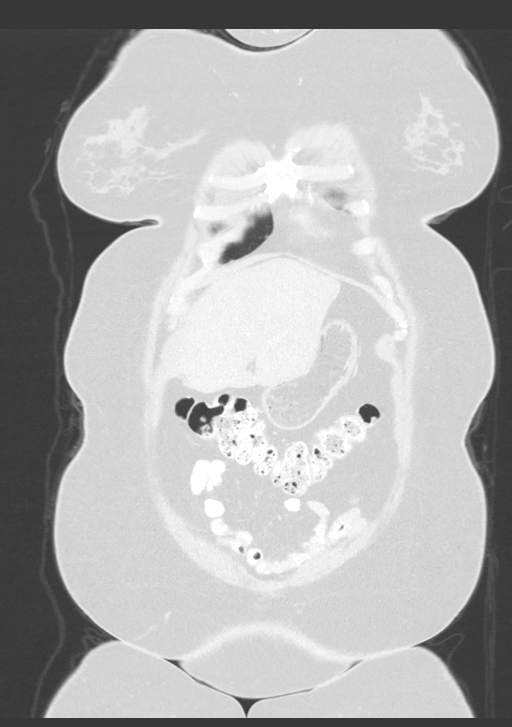
[im 54/135  lung]
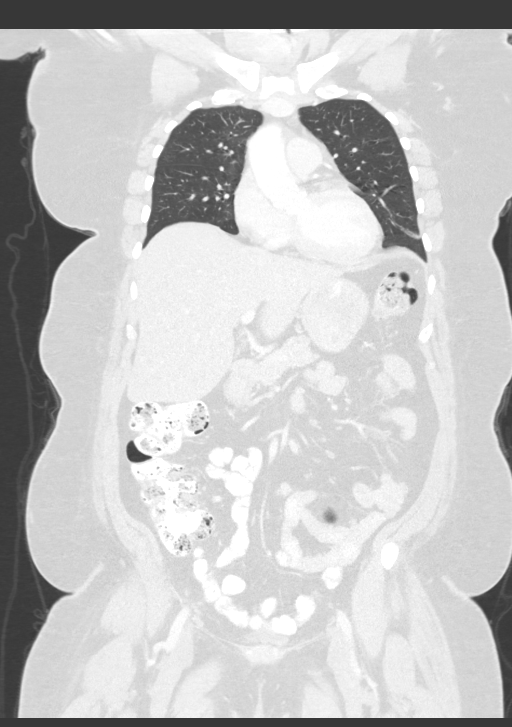
[im 81/135  lung]
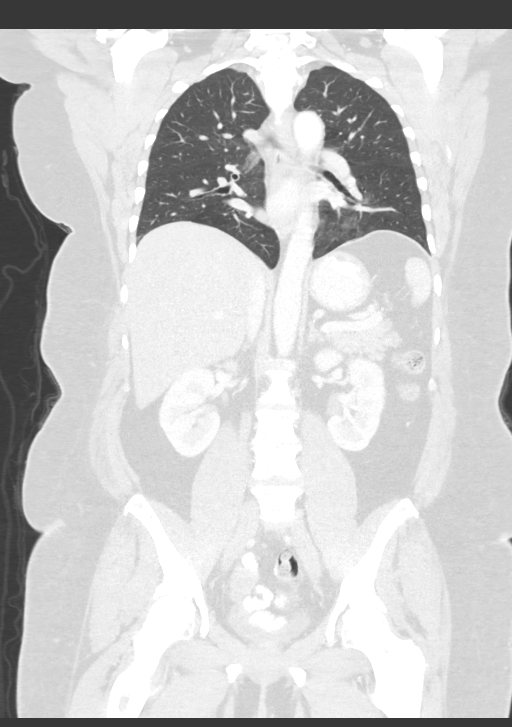

[13 of 36 positions shown; findings below may reference images not displayed]

FINDINGS: CT CHEST FINDINGS

Cardiovascular: Heart size is normal. There is no significant
pericardial fluid, thickening or pericardial calcification. No
atherosclerotic calcifications are noted in the thoracic aorta or
the coronary arteries. Dilatation of the pulmonic trunk (3.7 cm in
diameter), concerning for pulmonary arterial hypertension.

Mediastinum/Nodes: Further asymmetric enlargement of a
heterogeneously enhancing nodule in the right lobe of the thyroid
gland which currently measures 3.7 x 6.0 cm. No pathologically
enlarged mediastinal or hilar lymph nodes. Esophagus is unremarkable
in appearance. No axillary lymphadenopathy.

Lungs/Pleura: No suspicious appearing pulmonary nodules or masses.
No acute consolidative airspace disease. No pleural effusions.

Musculoskeletal: No aggressive appearing lytic or blastic lesions
are noted in the visualized portions of the skeleton. Orthopedic
fixation hardware in the lower cervical spine incompletely imaged.

CT ABDOMEN PELVIS FINDINGS

Hepatobiliary: No suspicious cystic or solid hepatic lesions. No
intra or extrahepatic biliary ductal dilatation. Gallbladder is
normal in appearance.

Pancreas: No pancreatic mass. No pancreatic ductal dilatation. No
pancreatic or peripancreatic fluid or inflammatory changes.

Spleen: Unremarkable.

Adrenals/Urinary Tract: Bilateral kidneys and bilateral adrenal
glands are normal in appearance. No hydroureteronephrosis. Urinary
bladder is nearly decompressed, but otherwise unremarkable in
appearance.

Stomach/Bowel: Normal appearance of the stomach. There is no
pathologic dilatation of small bowel or colon. Suture line in the
sigmoid colon. No soft tissue mass at the level of the suture line
to suggest locally recurrent disease. The appendix is not
confidently identified and may be surgically absent. Regardless,
there are no inflammatory changes noted adjacent to the cecum to
suggest the presence of an acute appendicitis at this time.

Vascular/Lymphatic: Aortic atherosclerosis, without evidence of
aneurysm or dissection in the abdominal or pelvic vasculature. No
lymphadenopathy noted in the abdomen or pelvis.

Reproductive: Status post supracervical hysterectomy. Ovaries are
unremarkable in appearance.

Other: No significant volume of ascites.  No pneumoperitoneum.

Musculoskeletal: There are no aggressive appearing lytic or blastic
lesions noted in the visualized portions of the skeleton.
IMPRESSION: 1. No findings to suggest locally recurrent disease or metastatic
disease in the chest, abdomen or pelvis.
2. Dilatation of the pulmonic trunk (3.7 cm in diameter), concerning
for pulmonary arterial hypertension.
3. Persistent and increasing mass-like enlargement of the right lobe
of the thyroid gland. Correlation with results of prior thyroid
ultrasound and biopsy is recommended.

## 2019-01-13 ENCOUNTER — Ambulatory Visit: Payer: BC Managed Care – PPO | Admitting: Licensed Clinical Social Worker

## 2019-01-13 ENCOUNTER — Ambulatory Visit: Payer: Self-pay | Admitting: Licensed Clinical Social Worker

## 2019-01-13 DIAGNOSIS — F329 Major depressive disorder, single episode, unspecified: Secondary | ICD-10-CM

## 2019-01-13 DIAGNOSIS — F411 Generalized anxiety disorder: Secondary | ICD-10-CM

## 2019-01-13 DIAGNOSIS — F32A Depression, unspecified: Secondary | ICD-10-CM

## 2019-01-13 NOTE — BH Specialist Note (Addendum)
Integrated Behavioral Health Follow Up Visit  MRN: [id number] Name: Jenny [name] [facility]  Session Start time: 9:3  Session End time: 10:30 Total time: 1 hour  Reason for follow-up: Continue brief intervention to assist patient with managing symptoms of anxiety and depression, as well as managing stressors. Report of symptoms: feeling tired ; negative thoughts, feeling bad about self, easily frustrated, difficulty in daily functions.  ASSESSMENT: Mood: Depressed and Affect: Appropriate and Depressed;Thought process: Coherent;  No plan to harm self or others. Patient is sluggish today and does not make good eye contact, reports taking medication daily with no side effects.  Patient is making slight progress towards goal however reports feeling about the same some good days and some bad. This is also evident by PHQ-9/GAD score.  She has made steps toward goals discussed during last visit. Contacting [place] about a class, exercising a few days and trying to eat healthy, however she has not been consistent. Continues to over think things, feels like she lives in fear and becomes anxious when thinking about her job.   Patient continues to experience symptoms of anxiety and depression, she may benefit from, and is in agreement to continue ongoing assessment and therapeutic interventions to assist with managing her symptoms. Patient is willing to add additional psychotropic medications based on recommendations from PCP and Dr. Modesta Messing consulting psychiatry.  GOALS:Patient will: 1. Reduce symptoms of: anxiety, depression and stress 2. Increase knowledge and/or ability of: coping skills, healthy habits, self-management skills and stress reduction  3.   Increase Heathy eating and exercise PLAN:  1.Patient will F/U with [facility] in 2 weeks 2.Behavioral recommendations: relaxed breathing 3. Activity outside of house 2/3 times a week 4. Referral to [facility]  Intervention: Motivational Interviewing, Solution-Focused  Strategies, Mindfulness or Psychologist, educational, Veterinary surgeon and Link to [employer], Psychoeducation   GAD 7 : Generalized Anxiety Score 01/13/2019 12/30/2018 11/25/2018 10/28/2018  Nervous, Anxious, on Edge 3 2 3 1   Control/stop worrying 2 1 1 1   Worry too much - different things 1 1 2 1   Trouble relaxing 2 2 2 2   Restless 2 2 1 1   Easily annoyed or irritable 2 2 3 2   Afraid - awful might happen 1 1 1 1   Total GAD 7 Score 13 11 13 9   Anxiety Difficulty Somewhat difficult - Very difficult -   Depression screen [facility] 2/9 01/13/2019 12/30/2018 12/04/2018  Decreased Interest 1 1 1   Down, Depressed, Hopeless 2 2 2   PHQ - 2 Score 3 3 3   Altered sleeping 1 2 2   Tired, decreased energy 2 3 3   Change in appetite 1 1 1   Feeling bad or failure about yourself  2 1 1   Trouble concentrating 2 2 2   Moving slowly or fidgety/restless 1 2 1   Suicidal thoughts 1 1 1   PHQ-9 Score 13 15 14   Difficult doing work/chores Somewhat difficult Somewhat difficult Somewhat difficult  Some recent data might be hidden   Jenny [name], [place]   [phone] 11:01 AM

## 2019-01-13 NOTE — Progress Notes (Signed)
[facility] ([place]) Psychiatric Consultant Case Review  ? Jenny [name] is a 58 y.o. year old female with history of depression, diabetes, sigmoid colon cancer, stage IIIB adenocarcinoma, s/p chemotherapy, partial resection, peripheral neuropathy after chemotherapy.   She continues to report depressive symptoms with prominent fatigue, although there is some improvement since uptitration of sertraline. Will add bupropion as adjunctive treatment for depression.  ? ? Recommendation - Start bupropion 150 mg daily  (side effects includes worsening in anxiety, headache. Do not start this medication if she has history of seizure) - Continue sertraline 150 mg daily   ? Thank you for your consult. We will continue to follow the patient. Please contact [place] for any questions or concerns.  ? The above treatment considerations and suggestions are based on consultation with the [facility] specialist and/or PCP and a review of information available in the shared registry and the patient's [place] Record ([name]). I have not personally examined the patient. All recommendations should be implemented with consideration of the patient's relevant prior history and current clinical status. Please feel free to call me with any questions about the care of this patient.

## 2019-01-13 NOTE — Patient Instructions (Addendum)
It was a pleasure seeing you today.  Please review the material I have given you on relaxed breathing.  Today we discussed your goals and various options to assist you with accomplishing them.    We also identified several next steps towards each of the goals below.   1. Career  2. Finances  3. Healthy eating and Exercise  Follow up with me in 2 weeks

## 2019-01-13 NOTE — BH Specialist Note (Signed)
Integrated Behavioral Health Treatment Planning Team  MRN: [id number] NAME: CLIO GERHART  DATE: 01/13/19  Start time: 1:00 End time: 1:30 Total time: 30 minutes Treatment Team Attendees: [name], LCSW & Dr. [name] Presenting Problem/Current Symptoms: She continues to report depressive symptoms with prominent fatigue, although there is some improvement since uptitration of sertraline (feeling tired ; negative thoughts, feeling bad about self, easily frustrated, difficulty in daily functions).  Diagnoses: Depression and anxiety Demographics/Context: 58 y.o.year old femal ewith history of depression, diabetes, sigmoid colon cancer, stage IIIB adenocarcinoma, s/p chemotherapy, partial resection, peripheral neuropathy after chemotherapy.  Psychotropic Medication Management: see below Team Recommendations: per Dr. Modesta Messing - Start bupropion 150 mg daily  (side effects includes worsening in anxiety, headache. Do not start this medication if she has history of seizure) - Continue sertraline 150 mg daily    We will continue to follow the patient. Please contact [place] for any questions or concerns.  Referral(s): none at this time Who else would benefit from hearing team recommendations? PCP Follow up Appointments: 01/27/2019 with [facility] and 2/19 with PCP   Medical History:  Past Medical History:  Diagnosis Date  . Anemia   . Cancer [facility])    colon cancer   . Enlarged thyroid     Labs:  Recent Results (from the past 2160 hour(s))  Glucose (CBG)     Status: Abnormal   Collection Time: 10/28/18 10:24 AM  Result Value Ref Range   POC Glucose 263 (A) 70 - 99 mg/dl  HgB A1c     Status: Abnormal   Collection Time: 11/25/18 11:25 AM  Result Value Ref Range   Hemoglobin A1C     HbA1c POC (<> result, manual entry)     HbA1c, POC (prediabetic range)     HbA1c, POC (controlled diabetic range) 10.7 (A) 0.0 - 7.0 %  Comprehensive metabolic panel     Status: Abnormal   Collection Time:  12/28/18  3:09 PM  Result Value Ref Range   Sodium 143 135 - 145 mmol/L   Potassium 3.9 3.5 - 5.1 mmol/L   Chloride 109 98 - 111 mmol/L   CO2 26 22 - 32 mmol/L   Glucose, Bld 101 (H) 70 - 99 mg/dL   BUN 11 6 - 20 mg/dL   Creatinine, Ser 0.77 0.44 - 1.00 mg/dL   Calcium 9.5 8.9 - 10.3 mg/dL   Total Protein 7.5 6.5 - 8.1 g/dL   Albumin 3.9 3.5 - 5.0 g/dL   AST 16 15 - 41 U/L   ALT 21 0 - 44 U/L   Alkaline Phosphatase 70 38 - 126 U/L   Total Bilirubin 0.4 0.3 - 1.2 mg/dL   GFR calc non Af Amer >60 >60 mL/min   GFR calc Af Amer >60 >60 mL/min   Anion gap 8 5 - 15    Comment: Performed at [facility], [address] [place], [place] [address]  CBC with Differential     Status: Abnormal   Collection Time: 12/28/18  3:09 PM  Result Value Ref Range   WBC 6.1 4.0 - 10.5 K/uL   RBC 4.44 3.87 - 5.11 MIL/uL   Hemoglobin 11.5 (L) 12.0 - 15.0 g/dL   HCT 36.7 36.0 - 46.0 %   MCV 82.7 80.0 - 100.0 fL   MCH 25.9 (L) 26.0 - 34.0 pg   MCHC 31.3 30.0 - 36.0 g/dL   RDW 15.9 (H) 11.5 - 15.5 %   Platelets 276 150 -  400 K/uL   nRBC 0.0 0.0 - 0.2 %   Neutrophils Relative % 51 %   Neutro Abs 3.1 1.7 - 7.7 K/uL   Lymphocytes Relative 43 %   Lymphs Abs 2.6 0.7 - 4.0 K/uL   Monocytes Relative 4 %   Monocytes Absolute 0.2 0.1 - 1.0 K/uL   Eosinophils Relative 2 %   Eosinophils Absolute 0.1 0.0 - 0.5 K/uL   Basophils Relative 0 %   Basophils Absolute 0.0 0.0 - 0.1 K/uL   Immature Granulocytes 0 %   Abs Immature Granulocytes 0.02 0.00 - 0.07 K/uL    Comment: Performed at [facility], [place] [address] [place], [place] [address]  CEA (IN HOUSE-[facility])     Status: None   Collection Time: 12/28/18  3:09 PM  Result Value Ref Range   CEA ([facility]-In House) 1.05 0.00 - 5.00 ng/mL    Comment: Performed at [facility], [place] [address] [place], [place] [address]  HgB A1c     Status: Abnormal   Collection Time: 12/30/18  9:30 AM   Result Value Ref Range   Hemoglobin A1C     HbA1c POC (<> result, manual entry)     HbA1c, POC (prediabetic range)     HbA1c, POC (controlled diabetic range) 8.9 (A) 0.0 - 7.0 %   Social History:  Social History   Socioeconomic History  . Marital status: Married    Spouse name: Not on file  . Number of children: Not on file  . Years of education: Not on file  . Highest education level: Not on file  Occupational History  . Not on file  Social Needs  . Financial resource strain: Not on file  . Food insecurity:    Worry: Not on file    Inability: Not on file  . Transportation needs:    Medical: Not on file    Non-medical: Not on file  Tobacco Use  . Smoking status: Never Smoker  . Smokeless tobacco: Never Used  Substance and Sexual Activity  . Alcohol use: No  . Drug use: No  . Sexual activity: Not on file  Lifestyle  . Physical activity:    Days per week: Not on file    Minutes per session: Not on file  . Stress: Not on file  Relationships  . Social connections:    Talks on phone: Not on file    Gets together: Not on file    Attends religious service: Not on file    Active member of club or organization: Not on file    Attends meetings of clubs or organizations: Not on file    Relationship status: Not on file  Other Topics Concern  . Not on file  Social History Narrative  . Not on file   Goals: Patient will: Increase motivation to adhere to plan of care Improve medication compliance and reduce symptoms of: anxiety depression stress and also Increase knowledge and/or ability of:  coping skills healthy habits self-management skills stress reduction  Interventions: Motivational Interviewing Solution-Focused Strategies Mindfulness or Games developer Link to [employer]  Standardized Assessments Completed: GAD-7 PHQ 9  Medication History: Current medications:  Outpatient Encounter Medications as of 01/13/2019  Medication  Sig  . acetaminophen (TYLENOL) 500 MG tablet Take 500 mg by mouth every 6 (six) hours as needed.  . fluticasone (FLONASE) 50 MCG/ACT nasal spray Place 2 sprays into both nostrils daily.  Marland Kitchen gabapentin (NEURONTIN) 300 MG  capsule Take 2 capsules (600 mg total) by mouth 3 (three) times daily.  . hydrocortisone cream 0.5 % Apply 1 application topically 2 (two) times daily as needed for itching.  . loratadine (CLARITIN) 10 MG tablet Take 10 mg by mouth daily. AS NEEDED  . metFORMIN (GLUCOPHAGE) 1000 MG tablet Take 1 tablet (1,000 mg total) by mouth 2 (two) times daily with a meal.  . metFORMIN (GLUCOPHAGE) 1000 MG tablet Take 1 tablet (1,000 mg total) by mouth 2 (two) times daily with a meal. Please counsel patient on new dose form  Thanks  . Multiple Vitamin (MULTIVITAMIN) tablet Take 1 tablet by mouth daily.  . sertraline (ZOLOFT) 100 MG tablet Take 1.5 tablets (150 mg total) by mouth daily.   No facility-administered encounter medications on file as of 01/13/2019.    Scribe for Treatment Team: Maurine Cane, LCSW Sharion Settler [facility]   [phone] 2:05 PM

## 2019-01-14 ENCOUNTER — Telehealth: Payer: Self-pay | Admitting: Family Medicine

## 2019-01-14 MED ORDER — BUPROPION HCL 75 MG PO TABS: ORAL_TABLET | ORAL | 0 refills | Status: DC

## 2019-01-14 NOTE — Telephone Encounter (Signed)
Short term disability  form dropped off for at front desk for completion.  Verified that patient section of form has been completed.  Last DOS/WCC with PCP was 12/30/18  Placed form in team folder to be completed by clinical staff.  Jenny [name]

## 2019-01-14 NOTE — Telephone Encounter (Signed)
Spoke with her Will start buproprion 75 mg initial dose daily then twice a day If tolerates well will then start 150 mg XR once daily

## 2019-01-14 NOTE — Telephone Encounter (Signed)
Called left message on vm

## 2019-01-15 NOTE — Telephone Encounter (Signed)
Reviewed form and placed in PCP's box for completion.   Jenny [name], [place]

## 2019-01-19 ENCOUNTER — Telehealth: Payer: Self-pay | Admitting: Licensed Clinical Social Worker

## 2019-01-19 NOTE — Telephone Encounter (Signed)
F/U to patient.  She reports not starting the Wellbutrin until last night.  She was hesitant but will follow instruction of PCP with taking the medication.  Medication update provided to PCP via in-basket message.   Patient also has a meeting this week with [facility] to assist with part of her behavioral activation therapy.  Patient excited and nervious.  Plan:  1. Patient will continue with psychotherapy as directed by PCP  2. Keep appointment with [facility] (behavioral activation). 3. F/U appointment with LCSW 01/27/2019  [name], [place]   [phone] 11:21 AM

## 2019-01-22 ENCOUNTER — Ambulatory Visit: Payer: BC Managed Care – PPO | Attending: Hematology | Admitting: Physical Therapy

## 2019-01-22 ENCOUNTER — Other Ambulatory Visit: Payer: Self-pay

## 2019-01-22 DIAGNOSIS — R262 Difficulty in walking, not elsewhere classified: Secondary | ICD-10-CM | POA: Diagnosis present

## 2019-01-22 DIAGNOSIS — R252 Cramp and spasm: Secondary | ICD-10-CM | POA: Insufficient documentation

## 2019-01-22 DIAGNOSIS — M25652 Stiffness of left hip, not elsewhere classified: Secondary | ICD-10-CM | POA: Insufficient documentation

## 2019-01-22 DIAGNOSIS — M25651 Stiffness of right hip, not elsewhere classified: Secondary | ICD-10-CM | POA: Diagnosis present

## 2019-01-22 DIAGNOSIS — M6281 Muscle weakness (generalized): Secondary | ICD-10-CM

## 2019-01-22 NOTE — Therapy (Signed)
[facility] [address] [place], [place], [address] Phone: [phone]   Fax:  [phone]  Physical Therapy Evaluation  Patient Details  Name: Jenny [name] MRN: [id number] Date of Birth: 1961/11/04 Referring Provider (PT): Truitt Merle, MD   Encounter Date: 01/22/2019  PT End of Session - 01/22/19 1528    Visit Number  1    Date for PT Re-Evaluation  04/16/19    Authorization Type  [employer]    PT Start Time  0845    PT Stop Time  0930    PT Time Calculation (min)  45 min    Activity Tolerance  Patient limited by pain       Past Medical History:  Diagnosis Date  . Anemia   . Cancer [facility])    colon cancer   . Enlarged thyroid     Past Surgical History:  Procedure Laterality Date  . BOWEL RESECTION    . CERVICAL SPINE SURGERY  06/17/2012   C5-C7 ACDF  . CESAREAN SECTION    . PARTIAL HYSTERECTOMY    . PORT-A-CATH REMOVAL Left 07/28/2015   Procedure: REMOVAL PORT-A-CATH;  Surgeon: [name], MD;  Location: [facility];  Service: General;  Laterality: Left;  . PORTACATH PLACEMENT Left 12/22/2014   Procedure: INSERTION PORT-A-CATH LEFT SUBCLAVIAN;  Surgeon: [name], MD;  Location: [facility];  Service: General;  Laterality: Left;  . TONSILLECTOMY      There were no vitals filed for this visit.   Subjective Assessment - 01/22/19 0847    Subjective  Pt is having fecal and urinary leakage.  Pt states yesterday was a bad day.  States she had leakage 3x.  I have to use the bathroom frequently from 4-6x for bowel movements.  Urinary leakage is full bladderand happens about 4x at least/per week.  It will be more if I'm not close to the bathroom,  I wear a pad 2-3/day, or a pull up maybe a few days/week.    Pertinent History  multiple abdominal surgeries, 3 c-sections, hx of sigmoid cancer    Limitations  Other (comment)   going out to do activities   Patient Stated Goals  Be able to go out and not having to worry about  rushing to the bathroom    Currently in Pain?  Yes   when having the diarrhea   Pain Score  10-Worst pain ever    Pain Location  Abdomen    Pain Orientation  Upper;Lower         [facility] PT Assessment - 01/22/19 0001      Assessment   Medical Diagnosis  C18.7,C77.2 (ICD-10-CM) - Cancer of sigmoid colon metastatic to intra-abdominal lymph node [facility])    Referring Provider (PT)  Truitt Merle, MD    Prior Therapy  no      Precautions   Precautions  None      Restrictions   Weight Bearing Restrictions  Yes      Balance Screen   Has the patient fallen in the past 6 months  Yes    How many times?  3    Has the patient had a decrease in activity level because of a fear of falling?   Yes   moving too fast; neuropathy   Is the patient reluctant to leave their home because of a fear of falling?   No      Home Social worker  Private residence    Living  Arrangements  Spouse/significant other;Other relatives;Children      Prior Function   Level of Independence  Independent    Vocation  On disability      Cognition   Overall Cognitive Status  Within Functional Limits for tasks assessed      Posture/Postural Control   Posture/Postural Control  Postural limitations    Postural Limitations  Flexed trunk      ROM / Strength   AROM / PROM / Strength  PROM;Strength      PROM   Overall PROM Comments  bil hip external and internal rotation 50% and painful      Flexibility   Soft Tissue Assessment /Muscle Length  yes    Hamstrings  70% bil      Palpation   Palpation comment  TTP and fascial restriction throughout abdomen      Ambulation/Gait   Gait Pattern  Trunk flexed;Decreased stride length                Objective measurements completed on examination: See above findings.    Pelvic Floor Special Questions - 01/22/19 0001    Prior Pelvic/Prostate Exam  Yes    Are you Pregnant or attempting pregnancy?  No    Prior Pregnancies  Yes    Number of  Pregnancies  3    Number of C-Sections  3    Currently Sexually Active  Yes   not regularly   Is this Painful  Yes   deep and penetrative   Marinoff Scale  pain prevents any attempts at intercourse    Urinary Leakage  Yes    How often  daily     Pad use  3/day    Activities that cause leaking  Coughing;Sneezing    Urinary urgency  Yes    Urinary frequency  --   2x during eval   Fecal incontinence  Yes    Fluid intake  a gallon water    Caffeine beverages  1coffee    Skin Integrity  --   dryness   External Palpation  TTP of all muscles    Pelvic Floor Internal Exam  pt informed and identity confirmed; consent given to perform internal soft tissue assessment and treatment    Exam Type  Vaginal    Sensation  TTP    Palpation  all muscles in first layer and fasci TTP; tight and TTP pubococcygeus    Strength  weak squeeze, no lift    Strength # of seconds  2    Tone  high and fascially restricted                 PT Short Term Goals - 01/22/19 0918      PT SHORT TERM GOAL #1   Title  perform 5 x sit to stand for balance assessment    Time  1    Period  Weeks    Status  New    Target Date  01/29/19      PT SHORT TERM GOAL #2   Title  pt will be ind with toileting techniques    Time  4    Period  Weeks    Status  New    Target Date  02/19/19      PT SHORT TERM GOAL #3   Title  pt will be ind with moisturizing perineum for improved tissue health    Time  4    Period  Weeks    Status  New  Target Date  02/19/19        PT Long Term Goals - 01/22/19 0853      PT LONG TERM GOAL #1   Title  pt will be ind with advanced HEP to continue to maintain her progress    Target Date  04/16/19      PT LONG TERM GOAL #2   Title  pt will be able to hold urge to void for at least 20 minutes in order to make it to a bathroom when she is out    Time  8    Period  Weeks    Status  New    Target Date  04/16/19      PT LONG TERM GOAL #3   Title  pt will demonstrate  ability to tolerate assessment of pelvic floor soft tissues without pain due to improved ability to stretch and relax muscles    Time  12    Period  Weeks    Status  New    Target Date  04/16/19      PT LONG TERM GOAL #4   Title  Be able to walk 30 minutes without leakage in order exercise as part of healthy lifestyle for reduced risk of cancer    Time  12    Period  Weeks    Status  New    Target Date  04/16/19             Plan - 01/22/19 1514    Clinical Impression Statement  Pt presents to clinic due to bowel and bladder leakage that is effecting her lifestyle and ability to participate in exercise.  She is also having pain that prevents her from having intercourse.  Due to patient's history of cancer and diabetes type II, it is beneficial for her to exercise 30 minutes per day for improved health outcomes.  Pt demonstrates decreased hip mobility, shortened hamstring muscle length.  she has fascial restrictions and tenderness throughout her abdomen.  She has posture and gait abnormalities as mentioned above.  She has very tight and TTP thoruhgout pelvic floor superficial muscles as well as puborectalis. Unable to palpate to levator ani and obdurator internus due to increased pain.  Pt is having full incontinence of bowel and bladder.  She has MMT of pelvic floor strength assessed vaginally 2/5 with 2 seconds hold.  She is also reporting balance issues that have decreased her activity level as well.  She will complete a balance assessment at next visit due to running out of time.  Pt will benefit from skilled PT to address impairments and return to maximum function with improved bowel and baldder control.      History and Personal Factors relevant to plan of care:  radiation, cancer, c-section x 3, hysterectomy, DM    Clinical Presentation  Stable    Clinical Presentation due to:  pt is stable    Clinical Decision Making  Moderate    Rehab Potential  Excellent    PT Frequency  2x / week     PT Duration  12 weeks    PT Treatment/Interventions  ADLs/Self Care Home Management;Biofeedback;Cryotherapy;Electrical Stimulation;Moist Heat;Therapeutic activities;Therapeutic exercise;Neuromuscular re-education;Balance training;Patient/family education;Scar mobilization;Passive range of motion;Dry needling;Manual techniques;Taping    PT Next Visit Plan  moisture, toileting, fascial release to abdomen, review urge to void and bowel diary,     PT Home Exercise Plan  bowel diary    Recommended Other Services  eval 01/22/19    Consulted and  Agree with Plan of Care  Patient       Patient will benefit from skilled therapeutic intervention in order to improve the following deficits and impairments:  Pain, Abnormal gait, Increased fascial restricitons, Impaired tone, Increased muscle spasms, Decreased scar mobility, Decreased strength, Decreased range of motion, Impaired flexibility, Difficulty walking, Decreased balance, Decreased endurance, Postural dysfunction  Visit Diagnosis: Difficulty in walking, not elsewhere classified  Cramp and spasm  Stiffness of left hip, not elsewhere classified  Stiffness of right hip, not elsewhere classified  Muscle weakness (generalized)     Problem List Patient Active Problem List   Diagnosis Date Noted  . Depression 09/17/2018  . Hypersomnolence 07/27/2018  . Pulmonary artery abnormality 07/01/2018  . Adjustment disorder with mixed anxiety and depressed mood 02/11/2018  . Back pain 12/25/2016  . Chemotherapy-induced peripheral neuropathy ([place]) 01/06/2016  . Poorly controlled diabetes mellitus ([place]) 12/07/2015  . Thyromegaly 12/14/2014  . Cancer of sigmoid colon metastatic to intra-abdominal lymph node ([place]) 11/14/2014  . Obesity 05/29/2009    Zannie Cove, PT 01/22/2019, 3:35 PM  [facility] [address] [place], [place], [address] Phone: [phone]   Fax:   [phone]  Name: ALMYRA BIRMAN MRN: [id number] Date of Birth: [birth year]

## 2019-01-22 NOTE — Patient Instructions (Signed)
Bowel Control and Holding On When bowel control is decreased or lost it is helpful to understand some control techniques. Here are some tips for controlling your bowel movements.  1. Choose the best time of day to have a bowel movement:  Usually the best time of day for a bowel movement will be a half hour to an hour after breakfast.  For some people, a half hour to an hour after lunch will work better.  These times are best because the body uses the gastrocolic reflex, a stimulation of bowel motion that occurs with eating, to help produce a bowel movement.  2. Make sure that you are not rushed and have convenient access to a bathroom at your selected time.  3. Eat all your meals at a predictable time each day.  The bowel functions best when food is introduced at the same regular intervals.  4. The amount of food eaten at a given time of day should be about the same size from day to day.  The bowel functions best when food is introduced in similar quantities from day to day.  It is fine to have a small breakfast and a large lunch, or vice versa, just be consistent.  5. Eat two servings of fruit or vegetables and at least one serving of complex carbohydrates (whole grains such as brown rice, bran, whole wheat bread, or oatmeal) at each meal.   A serving of fruit or vegetables is a half-cup or medium-sized piece of fruit.  A serving of a complex carbohydrate is a half-cup or a slice of bread.  It is often desirable to eat more than the recommended minimum amounts of fruits, vegetables, and complex carbohydrates.  6. Drink plenty of water-ideally eight glasses a day.  7. Until regular bowel movements are established at a desired time of day, take 2-3 dried prunes (or  to 1/3 cup of prune juice) each night to stimulate morning bowel function.  8. Exercise daily.  You may exercise at any time of day, but you may find that bowel function is helped most if the exercise is at a consistent time each  day.   Types of Fiber  There are two main types of fiber:  insoluble and soluble.  Both of these types can prevent and relieve constipation and diarrhea, although some people find one or the other to be more easily digested.  This handout details information about both types of fiber.  Insoluble Fiber       Functions of Insoluble Fiber . moves bulk through the intestines  . controls and balances the pH (acidity) in the intestines       Benefits of Insoluble Fiber . promotes regular bowel movement and prevents constipation  . removes fecal waste through colon in less time  . keeps an optimal pH in intestines to prevent microbes from producing cancer substances, therefore preventing colon cancer        Food Sources of Insoluble Fiber . whole-wheat products  . wheat bran "miller's bran" . corn bran  . flax seed or other seeds . vegetables such as green beans, broccoli, cauliflower and potato skins  . fruit skins and root vegetable skins  . popcorn . brown rice  Soluble Fiber       Functions of Soluble Fiber  . holds water in the colon to bulk and soften the stool . prolongs stomach emptying time so that sugar is released and absorbed more slowly  Benefits of Soluble Fiber . lowers total cholesterol and LDL cholesterol (the bad cholesterol) therefore reducing the risk of heart disease  . regulates blood sugar for people with diabetes       Food Sources of Soluble Fiber . oat/oat bran . dried beans and peas  . nuts  . barley  . flax seed or other seeds . fruits such as oranges, pears, peaches, and apples  . vegetables such as carrots  . psyllium husk  . prunes

## 2019-01-24 ENCOUNTER — Other Ambulatory Visit: Payer: Self-pay | Admitting: Family Medicine

## 2019-01-25 ENCOUNTER — Ambulatory Visit: Payer: BC Managed Care – PPO | Attending: Hematology | Admitting: Physical Therapy

## 2019-01-25 DIAGNOSIS — M6281 Muscle weakness (generalized): Secondary | ICD-10-CM | POA: Diagnosis present

## 2019-01-25 DIAGNOSIS — R252 Cramp and spasm: Secondary | ICD-10-CM | POA: Diagnosis present

## 2019-01-25 DIAGNOSIS — M25652 Stiffness of left hip, not elsewhere classified: Secondary | ICD-10-CM | POA: Diagnosis present

## 2019-01-25 DIAGNOSIS — M25651 Stiffness of right hip, not elsewhere classified: Secondary | ICD-10-CM | POA: Diagnosis present

## 2019-01-25 DIAGNOSIS — R262 Difficulty in walking, not elsewhere classified: Secondary | ICD-10-CM | POA: Diagnosis present

## 2019-01-25 MED ORDER — BUPROPION HCL ER (XL) 150 MG PO TB24: 150.0000 mg | ORAL_TABLET | Freq: Every day | ORAL | 1 refills | Status: DC

## 2019-01-25 NOTE — Therapy (Signed)
[facility] [address], [place], [place], [address] Phone: [phone]   Fax:  [phone]  Physical Therapy Treatment  Patient Details  Name: Jenny [name] MRN: [id number] Date of Birth: 1961-03-16 Referring Provider (PT): Truitt Merle, MD   Encounter Date: 01/25/2019  PT End of Session - 01/25/19 1352    Visit Number  2    Date for PT Re-Evaluation  04/16/19    PT Start Time  1017    PT Stop Time  1058    PT Time Calculation (min)  41 min    Activity Tolerance  Patient tolerated treatment well    Behavior During Therapy  [facility] for tasks assessed/performed       Past Medical History:  Diagnosis Date  . Anemia   . Cancer [facility])    colon cancer   . Enlarged thyroid     Past Surgical History:  Procedure Laterality Date  . BOWEL RESECTION    . CERVICAL SPINE SURGERY  06/17/2012   C5-C7 ACDF  . CESAREAN SECTION    . PARTIAL HYSTERECTOMY    . PORT-A-CATH REMOVAL Left 07/28/2015   Procedure: REMOVAL PORT-A-CATH;  Surgeon: [name], MD;  Location: [facility];  Service: General;  Laterality: Left;  . PORTACATH PLACEMENT Left 12/22/2014   Procedure: INSERTION PORT-A-CATH LEFT SUBCLAVIAN;  Surgeon: [name], MD;  Location: [facility];  Service: General;  Laterality: Left;  . TONSILLECTOMY      There were no vitals filed for this visit.  Subjective Assessment - 01/25/19 1023    Subjective  I had diarrhea yesterday and Saturday there was a lot of bathroom trips.      Patient Stated Goals  Be able to go out and not having to worry about rushing to the bathroom    Currently in Pain?  No/denies                       [facility] Adult PT Treatment/Exercise - 01/25/19 0001      Self-Care   Self-Care  Other Self-Care Comments    Other Self-Care Comments   toileting, moisturizing and urge to void techniques      Neuro Re-ed    Neuro Re-ed Details   diaphragmatic breathing in supine with heat on abdomen       Modalities   Modalities  Moist Heat      Moist Heat Therapy   Number Minutes Moist Heat  6 Minutes    Moist Heat Location  Other (comment)   abdomen     Manual Therapy   Manual Therapy  Myofascial release    Myofascial Release  abdominal fascial release to bladder and rectum in 6 planes around naval through all 3 planes of fascia; W release of bladder in 3 planes             PT Education - 01/25/19 1033    Education Details  toileting and moisturizing techniques    Person(s) Educated  Patient    Methods  Explanation;Handout;Demonstration;Verbal cues    Comprehension  Verbalized understanding       PT Short Term Goals - 01/22/19 0918      PT SHORT TERM GOAL #1   Title  perform 5 x sit to stand for balance assessment    Time  1    Period  Weeks    Status  New    Target Date  01/29/19      PT SHORT  TERM GOAL #2   Title  pt will be ind with toileting techniques    Time  4    Period  Weeks    Status  New    Target Date  02/19/19      PT SHORT TERM GOAL #3   Title  pt will be ind with moisturizing perineum for improved tissue health    Time  4    Period  Weeks    Status  New    Target Date  02/19/19        PT Long Term Goals - 01/22/19 0853      PT LONG TERM GOAL #1   Title  pt will be ind with advanced HEP to continue to maintain her progress    Target Date  04/16/19      PT LONG TERM GOAL #2   Title  pt will be able to hold urge to void for at least 20 minutes in order to make it to a bathroom when she is out    Time  8    Period  Weeks    Status  New    Target Date  04/16/19      PT LONG TERM GOAL #3   Title  pt will demonstrate ability to tolerate assessment of pelvic floor soft tissues without pain due to improved ability to stretch and relax muscles    Time  12    Period  Weeks    Status  New    Target Date  04/16/19      PT LONG TERM GOAL #4   Title  Be able to walk 30 minutes without leakage in order exercise as part of healthy lifestyle  for reduced risk of cancer    Time  12    Period  Weeks    Status  New    Target Date  04/16/19            Plan - 01/25/19 1257    Clinical Impression Statement  Patient had a good fascial release and heard bowel sounds during myofascial release.  She was able to understand toileting and urge to void techniques.  She did not make progress yet due to initial treatment since eval today.  Continue with POC.     PT Treatment/Interventions  ADLs/Self Care Home Management;Biofeedback;Cryotherapy;Electrical Stimulation;Moist Heat;Therapeutic activities;Therapeutic exercise;Neuromuscular re-education;Balance training;Patient/family education;Scar mobilization;Passive range of motion;Dry needling;Manual techniques;Taping    PT Next Visit Plan  f/u on moisture, toileting, fascial release to abdomen, review urge to void and bowel diary,     Consulted and Agree with Plan of Care  Patient       Patient will benefit from skilled therapeutic intervention in order to improve the following deficits and impairments:  Pain, Abnormal gait, Increased fascial restricitons, Impaired tone, Increased muscle spasms, Decreased scar mobility, Decreased strength, Decreased range of motion, Impaired flexibility, Difficulty walking, Decreased balance, Decreased endurance, Postural dysfunction  Visit Diagnosis: Difficulty in walking, not elsewhere classified  Cramp and spasm  Stiffness of left hip, not elsewhere classified  Stiffness of right hip, not elsewhere classified  Muscle weakness (generalized)     Problem List Patient Active Problem List   Diagnosis Date Noted  . Depression 09/17/2018  . Hypersomnolence 07/27/2018  . Pulmonary artery abnormality 07/01/2018  . Adjustment disorder with mixed anxiety and depressed mood 02/11/2018  . Back pain 12/25/2016  . Chemotherapy-induced peripheral neuropathy ([place]) 01/06/2016  . Poorly controlled diabetes mellitus ([place]) 12/07/2015  . Thyromegaly 12/14/2014   .  Cancer of sigmoid colon metastatic to intra-abdominal lymph node ([place]) 11/14/2014  . Obesity 05/29/2009    Zannie Cove, PT 01/25/2019, 1:52 PM   [facility] [address], [place], [place], [address] Phone: [phone]   Fax:  [phone]  Name: Jenny [name] MRN: [id number] Date of Birth: 1961/07/30

## 2019-01-25 NOTE — Patient Instructions (Addendum)
Moisturizers . They are used in the vagina to hydrate the mucous membrane that make up the vaginal canal. . Designed to keep a more normal acid balance (ph) . Once placed in the vagina, it will last between two to three days.  . Use 2-3 times per week at bedtime and last longer than 60 min. . Ingredients to avoid is glycerin and fragrance, can increase chance of infection . Should not be used just before sex due to causing irritation . Most are gels administered either in a tampon-shaped applicator or as a vaginal suppository. They are non-hormonal.   Types of Moisturizers . Samul Dada- drug store . Vitamin E vaginal suppositories- Whole foods, Amazon . Moist Again . Coconut oil- can break down condoms . Julva- (Do no use if on Tamoxifen) amazon . Yes moisturizer- amazon . NeuEve Silk , NeuEve Silver for menopausal or over 65 (if have severe vaginal atrophy or cancer treatments use NeuEve Silk for  1 month than move to [employer])- [employer], MapleFlower.dk . Olive and Bee intimate cream- www.oliveandbee.com.au . Mae vaginal moisturizer- Amazon  Creams to use externally on the Vulva area  Albertson's (good for for cancer patients that had radiation to the area)- [place] or [employer].FlyingBasics.com.br  V-magic cream - amazon  Julva-amazon  Vital "V Wild Yam salve ( help moisturize and help with thinning vulvar area, does have Orr by Damiva labial moisturizer ([place],    Things to avoid in the vaginal area . Do not use things to irritate the vulvar area . No lotions just specialized creams for the vulva area- Neogyn, V-magic, No soaps; can use Aveeno or Calendula cleanser if needed. Must be gentle . No deodorants . No douches . Good to sleep without underwear to let the vaginal area to air out . No scrubbing: spread the lips to let warm water rinse over labias and pat dry  Toileting Techniques  for Bowel Movements (Defecation) Using your belly (abdomen) and pelvic floor muscles to have a bowel movement is usually instinctive.  Sometimes people can have problems with these muscles and have to relearn proper defecation (emptying) techniques.  If you have weakness in your muscles, organs that are falling out, decreased sensation in your pelvis, or ignore your urge to go, you may find yourself straining to have a bowel movement.  You are straining if you are: . holding your breath or taking in a huge gulp of air and holding it  . keeping your lips and jaw tensed and closed tightly . turning red in the face because of excessive pushing or forcing . developing or worsening your  hemorrhoids . getting faint while pushing . not emptying completely and have to defecate many times a day  If you are straining, you are actually making it harder for yourself to have a bowel movement.  Many people find they are pulling up with the pelvic floor muscles and closing off instead of opening the anus. Due to lack pelvic floor relaxation and coordination the abdominal muscles, one has to work harder to push the feces out.  Many people have never been taught how to defecate efficiently and effectively.  Notice what happens to your body when you are having a bowel movement.  While you are sitting on the toilet pay attention to the following areas: . Jaw and mouth position . Angle of your hips   . Whether your feet touch  the ground or not . Arm placement  . Spine position . Waist . Belly tension . Anus (opening of the anal canal)  An Evacuation/Defecation Plan   Here are the 4 basic points:  1. Lean forward enough for your elbows to rest on your knees 2. Support your feet on the floor or use a low stool if your feet don't touch the floor  3. Push out your belly as if you have swallowed a beach ball-you should feel a widening of your waist 4. Open and relax your pelvic floor muscles, rather than  tightening around the anus      The following conditions my require modifications to your toileting posture:  . If you have had surgery in the past that limits your back, hip, pelvic, knee or ankle flexibility . Constipation   Your healthcare practitioner may make the following additional suggestions and adjustments:  1) Sit on the toilet  a) Make sure your feet are supported. b) Notice your hip angle and spine position-most people find it effective to lean forward or raise their knees, which can help the muscles around the anus to relax  c) When you lean forward, place your forearms on your thighs for support  2) Relax suggestions a) Breath deeply in through your nose and out slowly through your mouth as if you are smelling the flowers and blowing out the candles. b) To become aware of how to relax your muscles, contracting and releasing muscles can be helpful.  Pull your pelvic floor muscles in tightly by using the image of holding back gas, or closing around the anus (visualize making a circle smaller) and lifting the anus up and in.  Then release the muscles and your anus should drop down and feel open. Repeat 5 times ending with the feeling of relaxation. c) Keep your pelvic floor muscles relaxed; let your belly bulge out. d) The digestive tract starts at the mouth and ends at the anal opening, so be sure to relax both ends of the tube.  Place your tongue on the roof of your mouth with your teeth separated.  This helps relax your mouth and will help to relax the anus at the same time.  3) Empty (defecation) a) Keep your pelvic floor and sphincter relaxed, then bulge your anal muscles.  Make the anal opening wide.  b) Stick your belly out as if you have swallowed a beach ball. c) Make your belly wall hard using your belly muscles while continuing to breathe. Doing this makes it easier to open your anus. d) Breath out and give a grunt (or try using other sounds such as ahhhh, shhhhh,  ohhhh or grrrrrrr).  4) Finish a) As you finish your bowel movement, pull the pelvic floor muscles up and in.  This will leave your anus in the proper place rather than remaining pushed out and down. If you leave your anus pushed out and down, it will start to feel as though that is normal and give you incorrect signals about needing to have a bowel movement.   [facility] [employer] [place], [place] [address]  Relaxation Exercises with the Urge to Void   When you experience an urge to void:  FIRST  Stop and stand very still    Sit down if you can    Don't move    You need to stay very still to maintain control  SECOND Squeeze your pelvic floor muscles 5 times, like a  quick flick, to keep from leaking  THIRD Relax  Take a deep breath and then let it out  Try to make the urge go away by using relaxation and visualization techniques  FINALLY When you feel the urge go away somewhat, walk normally to the bathroom.   If the urge gets suddenly stronger on the way, you may stop again and relax to regain control.

## 2019-01-25 NOTE — Telephone Encounter (Signed)
Sent in Bupropion 150 XL daily

## 2019-01-27 ENCOUNTER — Ambulatory Visit (INDEPENDENT_AMBULATORY_CARE_PROVIDER_SITE_OTHER): Payer: BC Managed Care – PPO | Admitting: Licensed Clinical Social Worker

## 2019-01-27 DIAGNOSIS — F32A Depression, unspecified: Secondary | ICD-10-CM

## 2019-01-27 DIAGNOSIS — R42 Dizziness and giddiness: Secondary | ICD-10-CM

## 2019-01-27 DIAGNOSIS — F329 Major depressive disorder, single episode, unspecified: Secondary | ICD-10-CM

## 2019-01-27 LAB — GLUCOSE, POCT (MANUAL RESULT ENTRY): POC Glucose: 163 mg/dl — AB (ref 70–99)

## 2019-01-27 NOTE — Progress Notes (Signed)
Overall improvement in depression, anxiety. She was started on bupropion with uptitration to 150 mg daily. She complains of dizziness, headache; will continue to monitor.   - Would recommend continuing sertraline 150 mg daily, bupropion 150 mg daily if tolerated - [place] specialist to continue to coach behavioral activation

## 2019-01-27 NOTE — BH Specialist Note (Signed)
Integrated Behavioral Health Treatment Planning Team  MRN: [id number] NAME: Jenny [name]  DATE: 01/27/19  Start time: 1:00 End time: 1:15 Total time: 15 minutes Total number of Virtual [place] Treatment Team Plan encounters: 3/4  Treatment Team Attendees: [name], Elie Goody Hisada Diagnoses:    ICD-10-CM   1. Depression, unspecified depression type F32.9    Goals, Interventions and Follow-up Plan Goals: Patient will: Increase motivation to adhere to plan of care Improve medication compliance and reduce symptoms of: anxiety depression stress and also Increase knowledge and/or ability of:  coping skills healthy habits self-management skills stress reduction Interventions: Solution-Focused Strategies Behavioral Activation Link to [employer]  Standardized Assessments Completed: GAD-7 PHQ 9 Medication Management Recommendations: continue with current medication 150MG  zoloft and 150mg  Wellbutrin XL. Follow up Plan: Recommend from Dr. Modesta Messing:  continuing sertraline 150 mg daily, bupropion 150 mg daily if toleratedBH specialist to continue to coach behavioral activation.   Patient will F/U with PCP and LCSW in 2 weeks ( Feb. 19th ) Referral(s): [place] for Recovery to Wellness class   History of the present illness Presenting Problem/Current Symptoms:difficult with focus and making decision when multi things going on, ; easily frustrated and somewhat difficulty in daily functions   Screenings PHQ-9 Assessments:  Depression screen [facility] 2/9 01/27/2019 01/13/2019 12/30/2018  Decreased Interest 1 1 1   Down, Depressed, Hopeless 2 2 2   PHQ - 2 Score 3 3 3   Altered sleeping 1 1 2   Tired, decreased energy 2 2 3   Change in appetite 1 1 1   Feeling bad or failure about yourself  2 2 1   Trouble concentrating 2 2 2   Moving slowly or fidgety/restless 1 1 2   Suicidal thoughts 0 1 1  PHQ-9 Score 12 13 15   Difficult doing work/chores Somewhat difficult Somewhat difficult  Somewhat difficult  Some recent data might be hidden   GAD-7 Assessments:  GAD 7 : Generalized Anxiety Score 01/27/2019 01/13/2019 12/30/2018 11/25/2018  Nervous, Anxious, on Edge 2 3 2 3   Control/stop worrying 1 2 1 1   Worry too much - different things 1 1 1 2   Trouble relaxing 2 2 2 2   Restless 2 2 2 1   Easily annoyed or irritable 2 2 2 3   Afraid - awful might happen 1 1 1 1   Total GAD 7 Score 11 13 11 13   Anxiety Difficulty Somewhat difficult Somewhat difficult - Very difficult    Psychiatric History  Depression: Yes Anxiety: Yes Mania: No Psychosis: No PTSD symptoms: No Psychiatric History: is currently in counseling  Past Psychiatric History/Hospitalization(s): Hospitalization for psychiatric illness: No Prior Suicide Attempts: No Prior Self-injurious behavior: No Previous Treatment: none  Treatment Barriers: none identified today Strengths/Protective Factors: faith, family, grandchildren  Demographics/Context: 67 y.o.year old femalewith history of depression, diabetes, sigmoid colon cancer, stage IIIB adenocarcinoma, s/p chemotherapy, partial resection, peripheral neuropathy after chemotherapy  Social History:  Social History   Socioeconomic History  . Marital status: Married    Spouse name: Not on file  . Number of children: Not on file  . Years of education: Not on file  . Highest education level: Not on file  Occupational History  . Not on file  Social Needs  . Financial resource strain: Not on file  . Food insecurity:    Worry: Not on file    Inability: Not on file  . Transportation needs:    Medical: Not on file    Non-medical: Not on file  Tobacco Use  . Smoking  status: Never Smoker  . Smokeless tobacco: Never Used  Substance and Sexual Activity  . Alcohol use: No  . Drug use: No  . Sexual activity: Not on file  Lifestyle  . Physical activity:    Days per week: Not on file    Minutes per session: Not on file  . Stress: Not on file   Relationships  . Social connections:    Talks on phone: Not on file    Gets together: Not on file    Attends religious service: Not on file    Active member of club or organization: Not on file    Attends meetings of clubs or organizations: Not on file    Relationship status: Not on file  Other Topics Concern  . Not on file  Social History Narrative  . Not on file    Past Medical History Medical History:  Past Medical History:  Diagnosis Date  . Anemia   . Cancer [facility])    colon cancer   . Enlarged thyroid    Allergies:  Allergies as of 01/27/2019 - Review Complete 12/30/2018  Allergen Reaction Noted  . Other Rash 12/22/2014  . Shellfish allergy Rash 12/05/2014   Labs:  Recent Results (from the past 2160 hour(s))  HgB A1c     Status: Abnormal   Collection Time: 11/25/18 11:25 AM  Result Value Ref Range   Hemoglobin A1C     HbA1c POC (<> result, manual entry)     HbA1c, POC (prediabetic range)     HbA1c, POC (controlled diabetic range) 10.7 (A) 0.0 - 7.0 %  Comprehensive metabolic panel     Status: Abnormal   Collection Time: 12/28/18  3:09 PM  Result Value Ref Range   Sodium 143 135 - 145 mmol/L   Potassium 3.9 3.5 - 5.1 mmol/L   Chloride 109 98 - 111 mmol/L   CO2 26 22 - 32 mmol/L   Glucose, Bld 101 (H) 70 - 99 mg/dL   BUN 11 6 - 20 mg/dL   Creatinine, Ser 0.77 0.44 - 1.00 mg/dL   Calcium 9.5 8.9 - 10.3 mg/dL   Total Protein 7.5 6.5 - 8.1 g/dL   Albumin 3.9 3.5 - 5.0 g/dL   AST 16 15 - 41 U/L   ALT 21 0 - 44 U/L   Alkaline Phosphatase 70 38 - 126 U/L   Total Bilirubin 0.4 0.3 - 1.2 mg/dL   GFR calc non Af Amer >60 >60 mL/min   GFR calc Af Amer >60 >60 mL/min   Anion gap 8 5 - 15    Comment: Performed at [facility], [address] [place], [place] [address]  CBC with Differential     Status: Abnormal   Collection Time: 12/28/18  3:09 PM  Result Value Ref Range   WBC 6.1 4.0 - 10.5 K/uL   RBC 4.44 3.87 - 5.11 MIL/uL    Hemoglobin 11.5 (L) 12.0 - 15.0 g/dL   HCT 36.7 36.0 - 46.0 %   MCV 82.7 80.0 - 100.0 fL   MCH 25.9 (L) 26.0 - 34.0 pg   MCHC 31.3 30.0 - 36.0 g/dL   RDW 15.9 (H) 11.5 - 15.5 %   Platelets 276 150 - 400 K/uL   nRBC 0.0 0.0 - 0.2 %   Neutrophils Relative % 51 %   Neutro Abs 3.1 1.7 - 7.7 K/uL   Lymphocytes Relative 43 %   Lymphs Abs 2.6 0.7 - 4.0 K/uL  Monocytes Relative 4 %   Monocytes Absolute 0.2 0.1 - 1.0 K/uL   Eosinophils Relative 2 %   Eosinophils Absolute 0.1 0.0 - 0.5 K/uL   Basophils Relative 0 %   Basophils Absolute 0.0 0.0 - 0.1 K/uL   Immature Granulocytes 0 %   Abs Immature Granulocytes 0.02 0.00 - 0.07 K/uL    Comment: Performed at [facility], [place] [address] [place], [place] [address]  CEA (IN HOUSE-[facility])     Status: None   Collection Time: 12/28/18  3:09 PM  Result Value Ref Range   CEA ([facility]-In House) 1.05 0.00 - 5.00 ng/mL    Comment: Performed at [facility], [place] [address] [place], [place] [address]  HgB A1c     Status: Abnormal   Collection Time: 12/30/18  9:30 AM  Result Value Ref Range   Hemoglobin A1C     HbA1c POC (<> result, manual entry)     HbA1c, POC (prediabetic range)     HbA1c, POC (controlled diabetic range) 8.9 (A) 0.0 - 7.0 %  Glucose (CBG)     Status: Abnormal   Collection Time: 01/27/19 11:43 AM  Result Value Ref Range   POC Glucose 163 (A) 70 - 99 mg/dl   Medication History: Current medications:  Outpatient Encounter Medications as of 01/27/2019  Medication Sig  . acetaminophen (TYLENOL) 500 MG tablet Take 500 mg by mouth every 6 (six) hours as needed.  Marland Kitchen buPROPion (WELLBUTRIN XL) 150 MG 24 hr tablet Take 1 tablet (150 mg total) by mouth daily.  . fluticasone (FLONASE) 50 MCG/ACT nasal spray Place 2 sprays into both nostrils daily.  Marland Kitchen gabapentin (NEURONTIN) 300 MG capsule Take 2 capsules (600 mg total) by mouth 3 (three) times daily.  . hydrocortisone cream 0.5 % Apply 1  application topically 2 (two) times daily as needed for itching.  . loratadine (CLARITIN) 10 MG tablet Take 10 mg by mouth daily. AS NEEDED  . metFORMIN (GLUCOPHAGE) 1000 MG tablet Take 1 tablet (1,000 mg total) by mouth 2 (two) times daily with a meal.  . metFORMIN (GLUCOPHAGE) 1000 MG tablet Take 1 tablet (1,000 mg total) by mouth 2 (two) times daily with a meal. Please counsel patient on new dose form  Thanks  . Multiple Vitamin (MULTIVITAMIN) tablet Take 1 tablet by mouth daily.  . sertraline (ZOLOFT) 100 MG tablet Take 1.5 tablets (150 mg total) by mouth daily.   No facility-administered encounter medications on file as of 01/27/2019.    Scribe for Treatment Team: Maurine Cane, LCSW  Duriel Deery, [place]   [phone] 3:54 PM

## 2019-01-27 NOTE — Patient Instructions (Addendum)
It was a pleasure seeing you today.  You are making progress with the change in your medication.     You have an appointment at [facility] on Monday Feb. 10th 10:00am  at [facility]:  Introduction to Wellness and Recovery Session  1: Pathways to Recovery   2: Distorted Thinking  3: Boundaries Session  4: Self-Talk/Affirmations  5: Meaning and Purpose  6: Advocating for Self   Please keep your appointment for orientation with Truman Hayward at [facility]. [address] [place], [place] [address]. [phone]   [email or web address]    Friday Feb. 14th at 11:00am (arrive on time to avoid reschedule) This is orientation appointment is required in order to continue with the classes above.      Follow up with me in 2 weeks after appointment with PCP.

## 2019-01-27 NOTE — BH Specialist Note (Signed)
Integrated Behavioral Health Follow Up Visit  MRN: [id number] Name: Jenny [name] [facility]  Session Start time: 11:00  Session End time: 12:00 Total time: 1 hour  Reason for follow-up: Continue brief intervention to assist patient with managing symptoms of anxiety and depression, as well as manage stressors .  Report of symptoms: difficult with focus and making decision when multi things going on, ; easily frustrated and somewhat difficulty in daily functions. She continues to take medication as per PCP. started buproprion 75 mg initial dose daily, then twice a day.  Today is the first day with the increase of 150 mg XR once daily    ASSESSMENT: Mood: Anxious at times and Affect: Appropriate;Thought process: Coherent;  No plan to harm self or others.  Patient is pleasant and engaged in conversation, however toward end of visit started experiencing headache, light headedness, she believes she is starting to make progress towards goal.  States noticed her thoughts are becoming clear, now able to read her bible and finds that she sings which is what she enjoyed doing prior to symptoms of depression. This is also evident by  Slight decrease in PHQ-9 & GAD score. Patient may benefit from, and is in agreement to continue ongoing assessment and brief therapeutic interventions to assist with managing her symptoms. She is also open to attending a class at [place] Recovery to Wellness class which she will start on Monday Feb. 10th  GOALS:Patient will: 1. Reduce symptoms of: anxiety, depression and stress 2. Increase knowledge and/or ability of: coping skills, healthy habits, self-management skills and stress reduction   PLAN:  1.Patient will F/U with [facility] in 2 weeks 2.Behavioral recommendations: continue relaxed breathing, behavioral activation  3. Patient will keep appointment with [place]  Intervention: Solution-Focused Strategies, Behavioral Activation and Link to [employer] ( [place]), Behavioral Therapy (Relaxed breathing); Problem-solving teaching/coping strategies and Psychoeducation    GAD 7 : Generalized Anxiety Score 01/27/2019 01/13/2019 12/30/2018 11/25/2018  Nervous, Anxious, on Edge 2 3 2 3   Control/stop worrying 1 2 1 1   Worry too much - different things 1 1 1 2   Trouble relaxing 2 2 2 2   Restless 2 2 2 1   Easily annoyed or irritable 2 2 2 3   Afraid - awful might happen 1 1 1 1   Total GAD 7 Score 11 13 11 13   Anxiety Difficulty Somewhat difficult Somewhat difficult - Very difficult   Depression screen [facility] 2/9 01/27/2019 01/13/2019 12/30/2018  Decreased Interest 1 1 1   Down, Depressed, Hopeless 2 2 2   PHQ - 2 Score 3 3 3   Altered sleeping 1 1 2   Tired, decreased energy 2 2 3   Change in appetite 1 1 1   Feeling bad or failure about yourself  2 2 1   Trouble concentrating 2 2 2   Moving slowly or fidgety/restless 1 1 2   Suicidal thoughts 0 1 1  PHQ-9 Score 12 13 15   Difficult doing work/chores Somewhat difficult Somewhat difficult Somewhat difficult  Some recent data might be hidden    , [place]   [phone] 12:17 PM

## 2019-01-27 NOTE — Telephone Encounter (Signed)
Pt informed that the form was ready for pickup. Johnedward Brodrick, Salome Spotted, CMA  Copy placed in batch scanning. Aysia Lowder, Salome Spotted, CMA

## 2019-01-29 ENCOUNTER — Ambulatory Visit: Payer: BC Managed Care – PPO | Admitting: Physical Therapy

## 2019-02-03 ENCOUNTER — Encounter: Payer: Self-pay | Admitting: Physical Therapy

## 2019-02-03 ENCOUNTER — Ambulatory Visit: Payer: BC Managed Care – PPO | Admitting: Physical Therapy

## 2019-02-03 DIAGNOSIS — R262 Difficulty in walking, not elsewhere classified: Secondary | ICD-10-CM

## 2019-02-03 DIAGNOSIS — M25652 Stiffness of left hip, not elsewhere classified: Secondary | ICD-10-CM

## 2019-02-03 DIAGNOSIS — M6281 Muscle weakness (generalized): Secondary | ICD-10-CM

## 2019-02-03 DIAGNOSIS — M25651 Stiffness of right hip, not elsewhere classified: Secondary | ICD-10-CM

## 2019-02-03 DIAGNOSIS — R252 Cramp and spasm: Secondary | ICD-10-CM

## 2019-02-03 NOTE — Therapy (Signed)
[facility] [address], [place], [place], [address] Phone: [phone]   Fax:  [phone]  Physical Therapy Treatment  Patient Details  Name: Jenny [name] MRN: [id number] Date of Birth: 11/05/1961 Referring Provider (PT): Truitt Merle, MD   Encounter Date: 02/03/2019  PT End of Session - 02/03/19 0910    Visit Number  3    Date for PT Re-Evaluation  04/16/19    Authorization Type  BCBS    PT Start Time  0847    PT Stop Time  0925    PT Time Calculation (min)  38 min    Activity Tolerance  Patient tolerated treatment well    Behavior During Therapy  [facility] for tasks assessed/performed       Past Medical History:  Diagnosis Date  . Anemia   . Cancer [facility])    colon cancer   . Enlarged thyroid     Past Surgical History:  Procedure Laterality Date  . BOWEL RESECTION    . CERVICAL SPINE SURGERY  06/17/2012   C5-C7 ACDF  . CESAREAN SECTION    . PARTIAL HYSTERECTOMY    . PORT-A-CATH REMOVAL Left 07/28/2015   Procedure: REMOVAL PORT-A-CATH;  Surgeon: [name], MD;  Location: [facility];  Service: General;  Laterality: Left;  . PORTACATH PLACEMENT Left 12/22/2014   Procedure: INSERTION PORT-A-CATH LEFT SUBCLAVIAN;  Surgeon: [name], MD;  Location: [facility];  Service: General;  Laterality: Left;  . TONSILLECTOMY      There were no vitals filed for this visit.  Subjective Assessment - 02/03/19 1023    Subjective  I had a bad episode the other day at the store when I lost control of the bowels.  I can't hold it.    Patient Stated Goals  Be able to go out and not having to worry about rushing to the bathroom    Currently in Pain?  No/denies                    Pelvic Floor Special Questions - 02/03/19 0001    Biofeedback sensor type  Surface   anal sphincter   Biofeedback Activity  Quick contraction   3 second holds - all in sitting       [facility] Adult PT Treatment/Exercise - 02/03/19 0001       Neuro Re-ed    Neuro Re-ed Details   biofeedback: intially resting 4-36mV in standing, was able to relax to 2-11mV sitting; quick flicks in sitting with challenging to relax after contracting, needed cues to stop bending forward and engage TrA when trying to do kegel, ball squeeze and hip abduction with kegel               PT Short Term Goals - 01/22/19 0918      PT SHORT TERM GOAL #1   Title  perform 5 x sit to stand for balance assessment    Time  1    Period  Weeks    Status  New    Target Date  01/29/19      PT SHORT TERM GOAL #2   Title  pt will be ind with toileting techniques    Time  4    Period  Weeks    Status  New    Target Date  02/19/19      PT SHORT TERM GOAL #3   Title  pt will be ind with moisturizing perineum for improved tissue health  Time  4    Period  Weeks    Status  New    Target Date  02/19/19        PT Long Term Goals - 01/22/19 0853      PT LONG TERM GOAL #1   Title  pt will be ind with advanced HEP to continue to maintain her progress    Target Date  04/16/19      PT LONG TERM GOAL #2   Title  pt will be able to hold urge to void for at least 20 minutes in order to make it to a bathroom when she is out    Time  8    Period  Weeks    Status  New    Target Date  04/16/19      PT LONG TERM GOAL #3   Title  pt will demonstrate ability to tolerate assessment of pelvic floor soft tissues without pain due to improved ability to stretch and relax muscles    Time  12    Period  Weeks    Status  New    Target Date  04/16/19      PT LONG TERM GOAL #4   Title  Be able to walk 30 minutes without leakage in order exercise as part of healthy lifestyle for reduced risk of cancer    Time  12    Period  Weeks    Status  New    Target Date  04/16/19            Plan - 02/03/19 1024    Clinical Impression Statement  Pt did well with biofeedback today.  She was able to demonstrate improved muscle control with graduated quick  contracting.  She demosntrates a lot of difficutly with holding the contraction and her quick contraction became harder and weaker towards the end of the session.  She demontrated improved technique when cued to engage her core.  She also felt less discomfort in her abdmen when using TrA.  Pt will benefit from skilled PT to strengthen core and improve endurance of anal sphincter muscles.    PT Treatment/Interventions  ADLs/Self Care Home Management;Biofeedback;Cryotherapy;Electrical Stimulation;Moist Heat;Therapeutic activities;Therapeutic exercise;Neuromuscular re-education;Balance training;Patient/family education;Scar mobilization;Passive range of motion;Dry needling;Manual techniques;Taping    PT Next Visit Plan  biofeedback for control and endurance of anal sphincter muscles    Consulted and Agree with Plan of Care  Patient       Patient will benefit from skilled therapeutic intervention in order to improve the following deficits and impairments:  Pain, Abnormal gait, Increased fascial restricitons, Impaired tone, Increased muscle spasms, Decreased scar mobility, Decreased strength, Decreased range of motion, Impaired flexibility, Difficulty walking, Decreased balance, Decreased endurance, Postural dysfunction  Visit Diagnosis: Difficulty in walking, not elsewhere classified  Cramp and spasm  Stiffness of left hip, not elsewhere classified  Stiffness of right hip, not elsewhere classified  Muscle weakness (generalized)     Problem List Patient Active Problem List   Diagnosis Date Noted  . Depression 09/17/2018  . Hypersomnolence 07/27/2018  . Pulmonary artery abnormality 07/01/2018  . Adjustment disorder with mixed anxiety and depressed mood 02/11/2018  . Back pain 12/25/2016  . Chemotherapy-induced peripheral neuropathy ([place]) 01/06/2016  . Poorly controlled diabetes mellitus ([place]) 12/07/2015  . Thyromegaly 12/14/2014  . Cancer of sigmoid colon metastatic to intra-abdominal  lymph node ([place]) 11/14/2014  . Obesity 05/29/2009    Zannie Cove, PT 02/03/2019, 10:39 AM  [facility] [address], [place], [place], [address] Phone: [phone]   Fax:  [phone]  Name: Jenny [name] MRN: [id number] Date of Birth: 1961/07/12

## 2019-02-05 ENCOUNTER — Encounter: Payer: Self-pay | Admitting: Physical Therapy

## 2019-02-05 ENCOUNTER — Ambulatory Visit: Payer: BC Managed Care – PPO | Admitting: Physical Therapy

## 2019-02-05 DIAGNOSIS — R252 Cramp and spasm: Secondary | ICD-10-CM

## 2019-02-05 DIAGNOSIS — R262 Difficulty in walking, not elsewhere classified: Secondary | ICD-10-CM

## 2019-02-05 DIAGNOSIS — M25652 Stiffness of left hip, not elsewhere classified: Secondary | ICD-10-CM

## 2019-02-05 DIAGNOSIS — M25651 Stiffness of right hip, not elsewhere classified: Secondary | ICD-10-CM

## 2019-02-05 DIAGNOSIS — M6281 Muscle weakness (generalized): Secondary | ICD-10-CM

## 2019-02-05 NOTE — Therapy (Signed)
[facility] [address] [place], [place], [address] Phone: [phone]   Fax:  [phone]  Physical Therapy Treatment  Patient Details  Name: Jenny [name] MRN: [id number] Date of Birth: 11/02/1961 Referring Provider (PT): Truitt Merle, MD   Encounter Date: 02/05/2019  PT End of Session - 02/05/19 0859    Visit Number  4    Date for PT Re-Evaluation  04/16/19    Authorization Type  BCBS    PT Start Time  [phone]    PT Stop Time  0930    PT Time Calculation (min)  38 min    Activity Tolerance  Patient tolerated treatment well    Behavior During Therapy  [facility] for tasks assessed/performed       Past Medical History:  Diagnosis Date  . Anemia   . Cancer [facility])    colon cancer   . Enlarged thyroid     Past Surgical History:  Procedure Laterality Date  . BOWEL RESECTION    . CERVICAL SPINE SURGERY  06/17/2012   C5-C7 ACDF  . CESAREAN SECTION    . PARTIAL HYSTERECTOMY    . PORT-A-CATH REMOVAL Left 07/28/2015   Procedure: REMOVAL PORT-A-CATH;  Surgeon: [name], MD;  [facility]: [facility];  Service: General;  Laterality: Left;  . PORTACATH PLACEMENT Left 12/22/2014   Procedure: INSERTION PORT-A-CATH LEFT SUBCLAVIAN;  Surgeon: [name], MD;  [facility];  Service: General;  Laterality: Left;  . TONSILLECTOMY      There were no vitals filed for this visit.  Subjective Assessment - 02/05/19 0859    Subjective  I have been going back and forth to the bathroom a lot but no accidents    Patient Stated Goals  Be able to go out and not having to worry about rushing to the bathroom    Currently in Pain?  No/denies                       [facility] Adult PT Treatment/Exercise - 02/05/19 0001      Neuro Re-ed    Neuro Re-ed Details   biofeedback in sitting resting tone lower with exercises from 19mV down to 38mV      Exercises   Exercises  Lumbar      Lumbar Exercises: Standing   Other Standing  Lumbar Exercises  standing with cues to contract and relax pelic floor and TrA - no biofeedback just verbal and tactile cues - 15x      Lumbar Exercises: Seated   Other Seated Lumbar Exercises  ball squeeze and clam with kegel - hold and relax 15x each      Pelvic Floor Biofeedback   Biofeedback Activity  --   hold in sitting and standing 3 seconds hold, 6 sec rest            PT Education - 02/05/19 0928    Education Details   Access Code: [email or web address]     Person(s) Educated  Patient    Methods  Explanation;Demonstration;Verbal cues;Handout    Comprehension  Verbalized understanding;Returned demonstration       PT Short Term Goals - 02/05/19 1216      PT SHORT TERM GOAL #2   Title  pt will be ind with toileting techniques    Status  Achieved      PT SHORT TERM GOAL #3   Title  pt will be ind with moisturizing perineum for improved tissue health  Status  Achieved        PT Long Term Goals - 01/22/19 0853      PT LONG TERM GOAL #1   Title  pt will be ind with advanced HEP to continue to maintain her progress    Target Date  04/16/19      PT LONG TERM GOAL #2   Title  pt will be able to hold urge to void for at least 20 minutes in order to make it to a bathroom when she is out    Time  8    Period  Weeks    Status  New    Target Date  04/16/19      PT LONG TERM GOAL #3   Title  pt will demonstrate ability to tolerate assessment of pelvic floor soft tissues without pain due to improved ability to stretch and relax muscles    Time  12    Period  Weeks    Status  New    Target Date  04/16/19      PT LONG TERM GOAL #4   Title  Be able to walk 30 minutes without leakage in order exercise as part of healthy lifestyle for reduced risk of cancer    Time  12    Period  Weeks    Status  New    Target Date  04/16/19            Plan - 02/05/19 9628    Clinical Impression Statement  Pt did well with biofeedback in sitting.  She needed a lot of cues to not hold  her breath.  Pt did standing exercise without biofeedback due to poor connection. Pt will benefit from skilled PT due to low endurance.  She still has difficutly holding >3 seconds and coordinating with core and breath.     PT Treatment/Interventions  ADLs/Self Care Home Management;Biofeedback;Cryotherapy;Electrical Stimulation;Moist Heat;Therapeutic activities;Therapeutic exercise;Neuromuscular re-education;Balance training;Patient/family education;Scar mobilization;Passive range of motion;Dry needling;Manual techniques;Taping    PT Next Visit Plan  biofeedback for control and endurance of anal sphincter muscles    PT Home Exercise Plan   Access Code: [email or web address]     [email or web address] and Agree with Plan of Care  Patient       Patient will benefit from skilled therapeutic intervention in order to improve the following deficits and impairments:  Pain, Abnormal gait, Increased fascial restricitons, Impaired tone, Increased muscle spasms, Decreased scar mobility, Decreased strength, Decreased range of motion, Impaired flexibility, Difficulty walking, Decreased balance, Decreased endurance, Postural dysfunction  Visit Diagnosis: Difficulty in walking, not elsewhere classified  Cramp and spasm  Stiffness of left hip, not elsewhere classified  Stiffness of right hip, not elsewhere classified  Muscle weakness (generalized)     Problem List Patient Active Problem List   Diagnosis Date Noted  . Depression 09/17/2018  . Hypersomnolence 07/27/2018  . Pulmonary artery abnormality 07/01/2018  . Adjustment disorder with mixed anxiety and depressed mood 02/11/2018  . Back pain 12/25/2016  . Chemotherapy-induced peripheral neuropathy ([place]) 01/06/2016  . Poorly controlled diabetes mellitus ([place]) 12/07/2015  . Thyromegaly 12/14/2014  . Cancer of sigmoid colon metastatic to intra-abdominal lymph node ([place]) 11/14/2014  . Obesity 05/29/2009    Zannie Cove, PT 02/05/2019, 12:16 PM  [facility] [address] [place], [place], [address] Phone: [phone]   Fax:  [phone]  Name: Jenny [name] MRN: [id number] Date of Birth: 01-16-1961

## 2019-02-05 NOTE — Patient Instructions (Signed)
Access Code: [email or web address]  URL: [email or web address]  Date: 02/05/2019  Prepared by: Lovett Calender   Exercises  Seated Transversus Abdominis Bracing - 10 reps - 3 sets - 1x daily - 7x weekly  Seated Pelvic Floor Contraction - 10 reps - 1 sets - 3 sec hold - 3x daily - 7x weekly  Seated Pelvic Floor Contraction with Isometric Hip Adduction - 10 reps - 1 sets - 3 sechold, 3 sec rest hold - 3x daily - 7x weekly

## 2019-02-08 ENCOUNTER — Inpatient Hospital Stay: Payer: BC Managed Care – PPO | Attending: Family Medicine

## 2019-02-08 ENCOUNTER — Encounter: Payer: Self-pay | Admitting: Physical Therapy

## 2019-02-08 ENCOUNTER — Ambulatory Visit: Payer: BC Managed Care – PPO | Admitting: Physical Therapy

## 2019-02-08 DIAGNOSIS — M25651 Stiffness of right hip, not elsewhere classified: Secondary | ICD-10-CM

## 2019-02-08 DIAGNOSIS — R252 Cramp and spasm: Secondary | ICD-10-CM

## 2019-02-08 DIAGNOSIS — M6281 Muscle weakness (generalized): Secondary | ICD-10-CM

## 2019-02-08 DIAGNOSIS — Z85038 Personal history of other malignant neoplasm of large intestine: Secondary | ICD-10-CM | POA: Insufficient documentation

## 2019-02-08 DIAGNOSIS — C187 Malignant neoplasm of sigmoid colon: Secondary | ICD-10-CM

## 2019-02-08 DIAGNOSIS — R262 Difficulty in walking, not elsewhere classified: Secondary | ICD-10-CM

## 2019-02-08 DIAGNOSIS — C772 Secondary and unspecified malignant neoplasm of intra-abdominal lymph nodes: Secondary | ICD-10-CM

## 2019-02-08 DIAGNOSIS — D649 Anemia, unspecified: Secondary | ICD-10-CM

## 2019-02-08 DIAGNOSIS — M25652 Stiffness of left hip, not elsewhere classified: Secondary | ICD-10-CM

## 2019-02-08 LAB — CBC WITH DIFFERENTIAL/PLATELET
Abs Immature Granulocytes: 0.02 10*3/uL (ref 0.00–0.07)
Basophils Absolute: 0 10*3/uL (ref 0.0–0.1)
Basophils Relative: 0 %
Eosinophils Absolute: 0.1 10*3/uL (ref 0.0–0.5)
Eosinophils Relative: 2 %
HCT: 36.9 % (ref 36.0–46.0)
Hemoglobin: 11.6 g/dL — ABNORMAL LOW (ref 12.0–15.0)
Immature Granulocytes: 0 %
Lymphocytes Relative: 37 %
Lymphs Abs: 2.4 10*3/uL (ref 0.7–4.0)
MCH: 25.8 pg — ABNORMAL LOW (ref 26.0–34.0)
MCHC: 31.4 g/dL (ref 30.0–36.0)
MCV: 82.2 fL (ref 80.0–100.0)
Monocytes Absolute: 0.3 10*3/uL (ref 0.1–1.0)
Monocytes Relative: 4 %
Neutro Abs: 3.8 10*3/uL (ref 1.7–7.7)
Neutrophils Relative %: 57 %
Platelets: 306 10*3/uL (ref 150–400)
RBC: 4.49 MIL/uL (ref 3.87–5.11)
RDW: 16.5 % — ABNORMAL HIGH (ref 11.5–15.5)
WBC: 6.6 10*3/uL (ref 4.0–10.5)
nRBC: 0 % (ref 0.0–0.2)

## 2019-02-08 LAB — COMPREHENSIVE METABOLIC PANEL
ALT: 15 U/L (ref 0–44)
AST: 12 U/L — ABNORMAL LOW (ref 15–41)
Albumin: 4.1 g/dL (ref 3.5–5.0)
Alkaline Phosphatase: 73 U/L (ref 38–126)
Anion gap: 11 (ref 5–15)
BUN: 14 mg/dL (ref 6–20)
CO2: 25 mmol/L (ref 22–32)
Calcium: 10 mg/dL (ref 8.9–10.3)
Chloride: 104 mmol/L (ref 98–111)
Creatinine, Ser: 0.85 mg/dL (ref 0.44–1.00)
GFR calc Af Amer: >60 mL/min (ref >60)
GFR calc non Af Amer: >60 mL/min (ref >60)
Glucose, Bld: 164 mg/dL — ABNORMAL HIGH (ref 70–99)
Potassium: 3.9 mmol/L (ref 3.5–5.1)
Sodium: 140 mmol/L (ref 135–145)
Total Bilirubin: 0.3 mg/dL (ref 0.3–1.2)
Total Protein: 7.7 g/dL (ref 6.5–8.1)

## 2019-02-08 NOTE — Patient Instructions (Signed)
Relaxation Exercises with the Urge to Void   When you experience an urge to void:  FIRST  Stop and stand very still    Sit down if you can    Don't move    You need to stay very still to maintain control  SECOND Squeeze your pelvic floor muscles 5 times, like a quick flick, to keep from leaking  THIRD Relax  Take a deep breath and then let it out  Try to make the urge go away by using relaxation and visualization techniques  FINALLY When you feel the urge go away somewhat, walk normally to the bathroom.   If the urge gets suddenly stronger on the way, you may stop again and relax to regain control.  This is for bladder control, but works the same for bowel control.   [place] [address], [place], [place] [address] Phone # [phone]                                     Fax [phone]

## 2019-02-08 NOTE — Therapy (Signed)
[facility] [address] [place], [place], [address] Phone: [phone]   Fax:  [phone]  Physical Therapy Treatment  Patient Details  Name: Jenny [name] MRN: [id number] Date of Birth: October 09, 1961 Referring Provider (PT): Truitt Merle, MD   Encounter Date: 02/08/2019  PT End of Session - 02/08/19 1027    Visit Number  5    Date for PT Re-Evaluation  04/16/19    Authorization Type  BCBS    PT Start Time  1021    PT Stop Time  1101    PT Time Calculation (min)  40 min    Activity Tolerance  Patient tolerated treatment well    Behavior During Therapy  [facility] for tasks assessed/performed       Past Medical History:  Diagnosis Date  . Anemia   . Cancer [facility])    colon cancer   . Enlarged thyroid     Past Surgical History:  Procedure Laterality Date  . BOWEL RESECTION    . CERVICAL SPINE SURGERY  06/17/2012   C5-C7 ACDF  . CESAREAN SECTION    . PARTIAL HYSTERECTOMY    . PORT-A-CATH REMOVAL Left 07/28/2015   Procedure: REMOVAL PORT-A-CATH;  Surgeon: [name], MD;  [facility];  Service: General;  Laterality: Left;  . PORTACATH PLACEMENT Left 12/22/2014   Procedure: INSERTION PORT-A-CATH LEFT SUBCLAVIAN;  Surgeon: [name], MD;  [facility]: [facility];  Service: General;  Laterality: Left;  . TONSILLECTOMY      There were no vitals filed for this visit.  Subjective Assessment - 02/08/19 1115    Subjective  I had 2 episodes of diarrhea but I haven't gone on myself.  I sometimes just have diarrhea when I go to urinate and don't even realize I was going to have it, it comes out like liquid.    Patient Stated Goals  Be able to go out and not having to worry about rushing to the bathroom    Currently in Pain?  No/denies                    Pelvic Floor Special Questions - 02/08/19 0001    Biofeedback sensor type  Surface        [facility] Adult PT Treatment/Exercise - 02/08/19 0001      Self-Care   Other Self-Care Comments   educated and performed updates to HEP and urge to void bowe/bladder techniques      Neuro Re-ed    Neuro Re-ed Details   biofeedback for educating anal sphincter muscles      Lumbar Exercises: Stretches   Active Hamstring Stretch  Right;Left;2 reps;30 seconds    Hip Flexor Stretch  Right;Left;2 reps;20 seconds    Other Lumbar Stretch Exercise  butterfly and windshield wipers - 1 set x 20 sec no reduced tone      Lumbar Exercises: Sidelying   Clam  Right;Left;15 reps;4 seconds   5 sec rest to reduce tone   Other Sidelying Lumbar Exercises  kegel in sidelying both ways             PT Education - 02/08/19 1044    Education Details   Access Code: [email or web address] and urge tecniques    Person(s) Educated  Patient    Methods  Explanation;Demonstration;Handout;Verbal cues    Comprehension  Verbalized understanding;Returned demonstration       PT Short Term Goals - 02/05/19 1216      PT SHORT TERM GOAL #2  Title  pt will be ind with toileting techniques    Status  Achieved      PT SHORT TERM GOAL #3   Title  pt will be ind with moisturizing perineum for improved tissue health    Status  Achieved        PT Long Term Goals - 02/08/19 1038      PT LONG TERM GOAL #1   Title  pt will be ind with advanced HEP to continue to maintain her progress    Status  On-going      PT LONG TERM GOAL #2   Title  pt will be able to hold urge to void for at least 20 minutes in order to make it to a bathroom when she is out    Baseline  able to hold about 1 min, has been making it to the bathroom    Status  On-going      PT LONG TERM GOAL #4   Title  Be able to walk 30 minutes without leakage in order exercise as part of healthy lifestyle for reduced risk of cancer    Status  On-going            Plan - 02/08/19 1104    Clinical Impression Statement  Pt was able to contract >5.51mV and hold 4 seconds with 5 second rest and did exercises in sidelying  today.  Pt needed cues and feedback for relaxing after bed mobility.  Several stretches performed and did better with stretches in standing and h/s stretch lying down only with PT assistance.  Pt continues to have a lot of muscle and fascia tendsion and difficulty with increased pelvic floor tone, but tone lowers when she remains still and does some kegel exercises.  Pt will benefit form skilled PT to continue working on pelvic floor muscle control.    PT Treatment/Interventions  ADLs/Self Care Home Management;Biofeedback;Cryotherapy;Electrical Stimulation;Moist Heat;Therapeutic activities;Therapeutic exercise;Neuromuscular re-education;Balance training;Patient/family education;Scar mobilization;Passive range of motion;Dry needling;Manual techniques;Taping    PT Next Visit Plan  biofeedback for control and endurance of anal sphincter muscles    PT Home Exercise Plan   Access Code: [email or web address]     Recommended Other Services  eval 1/31 signed    Consulted and Agree with Plan of Care  Patient       Patient will benefit from skilled therapeutic intervention in order to improve the following deficits and impairments:  Pain, Abnormal gait, Increased fascial restricitons, Impaired tone, Increased muscle spasms, Decreased scar mobility, Decreased strength, Decreased range of motion, Impaired flexibility, Difficulty walking, Decreased balance, Decreased endurance, Postural dysfunction  Visit Diagnosis: Difficulty in walking, not elsewhere classified  Cramp and spasm  Stiffness of left hip, not elsewhere classified  Stiffness of right hip, not elsewhere classified  Muscle weakness (generalized)     Problem List Patient Active Problem List   Diagnosis Date Noted  . Depression 09/17/2018  . Hypersomnolence 07/27/2018  . Pulmonary artery abnormality 07/01/2018  . Adjustment disorder with mixed anxiety and depressed mood 02/11/2018  . Back pain 12/25/2016  . Chemotherapy-induced peripheral neuropathy  ([place]) 01/06/2016  . Poorly controlled diabetes mellitus ([place]) 12/07/2015  . Thyromegaly 12/14/2014  . Cancer of sigmoid colon metastatic to intra-abdominal lymph node ([place]) 11/14/2014  . Obesity 05/29/2009    Zannie Cove, PT 02/08/2019, 11:16 AM  [facility] [address], [place], [place], [address] Phone: [phone]   Fax:  [phone]  Name: Jenny [name] MRN: [id number]  Date of Birth: 04/04/1961

## 2019-02-10 ENCOUNTER — Ambulatory Visit (INDEPENDENT_AMBULATORY_CARE_PROVIDER_SITE_OTHER): Payer: BC Managed Care – PPO | Admitting: Licensed Clinical Social Worker

## 2019-02-10 ENCOUNTER — Ambulatory Visit (INDEPENDENT_AMBULATORY_CARE_PROVIDER_SITE_OTHER): Payer: BC Managed Care – PPO | Admitting: Family Medicine

## 2019-02-10 ENCOUNTER — Telehealth: Payer: Self-pay | Admitting: Family Medicine

## 2019-02-10 ENCOUNTER — Encounter: Payer: Self-pay | Admitting: Family Medicine

## 2019-02-10 ENCOUNTER — Other Ambulatory Visit: Payer: Self-pay

## 2019-02-10 DIAGNOSIS — E1165 Type 2 diabetes mellitus with hyperglycemia: Secondary | ICD-10-CM

## 2019-02-10 DIAGNOSIS — F4323 Adjustment disorder with mixed anxiety and depressed mood: Secondary | ICD-10-CM | POA: Diagnosis not present

## 2019-02-10 DIAGNOSIS — R197 Diarrhea, unspecified: Secondary | ICD-10-CM

## 2019-02-10 LAB — IRON AND TIBC
Iron: 44 ug/dL (ref 41–142)
Saturation Ratios: 15 % — ABNORMAL LOW (ref 21–57)
TIBC: 288 ug/dL (ref 236–444)
UIBC: 244 ug/dL (ref 120–384)

## 2019-02-10 LAB — FERRITIN: Ferritin: 91 ng/mL (ref 11–307)

## 2019-02-10 LAB — CEA (IN HOUSE-CHCC): CEA (CHCC-In House): 1.15 ng/mL (ref 0.00–5.00)

## 2019-02-10 MED ORDER — METFORMIN HCL ER 750 MG PO TB24: 750.0000 mg | ORAL_TABLET | Freq: Two times a day (BID) | ORAL | 1 refills | Status: DC

## 2019-02-10 NOTE — Assessment & Plan Note (Signed)
Stable.  See IC note.  Tolerating medications well

## 2019-02-10 NOTE — Progress Notes (Signed)
S 

## 2019-02-10 NOTE — Assessment & Plan Note (Signed)
She will continue to work on diet.  Change to metformin XR to see if helps diarrhea.

## 2019-02-10 NOTE — Patient Instructions (Addendum)
Good to see you today!  Thanks for coming in.  For the bowel movement try metamucil one scoop every day to make the bowel movement firmer  Start a calendar for your exercise  Go to bed and get up around the same time every day  I changed your metformin to 750 mg XR - take one twice a day   Come back in 6 weeks for diabetes and medication check

## 2019-02-10 NOTE — Telephone Encounter (Signed)
Disability form dropped off at front desk for completion.  Verified that patient section of form has been completed.  Last DOS/WCC with PCP was 02/10/19.  Placed form in team folder to be completed by clinical staff.  Jenny [name]

## 2019-02-10 NOTE — Progress Notes (Signed)
Subjective  Jenny [name] is a 58 y.o. female is presenting with the following  DIABETES Disease Monitoring: Blood Sugar ranges(Severity) -not checking  Associated Symptoms- Polyuria/phagia/dipsia- no      Visual problems- no Medications: Compliance(Modifying factor) - taking metformin regularly Hypoglycemic symptoms- no Timing - continuous  DIARRHEA On and off for a long time.  Sometimes is incontinent.  Not worseing.  No lower extremity weakness or urinary incontinence.  Does not relate it to metformin. Bm are first formed then become runny.  No bleeding   Monitoring Labs and Parameters Last A1C:  Lab Results  Component Value Date   HGBA1C 8.9 (A) 12/30/2018   Last Lipid:     Component Value Date/Time   CHOL 150 12/25/2016 1157   HDL 41 (L) 12/25/2016 1157   Last Bmet  Potassium  Date Value Ref Range Status  02/08/2019 3.9 3.5 - 5.1 mmol/L Final  06/18/2017 4.1 3.5 - 5.1 mEq/L Final   Sodium  Date Value Ref Range Status  02/08/2019 140 135 - 145 mmol/L Final  02/25/2018 144 134 - 144 mmol/L Final  06/18/2017 142 136 - 145 mEq/L Final   Creatinine  Date Value Ref Range Status  06/18/2017 0.8 0.6 - 1.1 mg/dL Final   Creatinine, Ser  Date Value Ref Range Status  02/08/2019 0.85 0.44 - 1.00 mg/dL Final           Chief Complaint noted Review of Symptoms - see HPI PMH - Smoking status noted.    Objective Vital Signs reviewed BP 120/80   Pulse 68   Temp 98.6 F (37 C) (Oral)   Ht 5\' 7"  (1.702 m)   Wt 250 lb (113.4 kg)   SpO2 98%   BMI 39.16 kg/m  Normal gail and balance  Assessments/Plans  See after visit summary for details of patient instuctions  Poorly controlled diabetes mellitus ([place]) She will continue to work on diet.  Change to metformin XR to see if helps diarrhea.  Diarrhea Perhaps related to metformin - change to XR.  Also add metamucil to have more formed stools.   No signs of neural compromise   Adjustment disorder with mixed  anxiety and depressed mood Stable.  See IC note.  Tolerating medications well

## 2019-02-10 NOTE — Assessment & Plan Note (Signed)
Perhaps related to metformin - change to XR.  Also add metamucil to have more formed stools.   No signs of neural compromise

## 2019-02-10 NOTE — Telephone Encounter (Signed)
Reviewed disability form and placed in PCP's box for completion.  Jenny [name], [place]

## 2019-02-11 ENCOUNTER — Telehealth: Payer: Self-pay

## 2019-02-11 NOTE — Telephone Encounter (Signed)
-----   Message from Truitt Merle, MD sent at 02/10/2019  6:48 PM EST ----- Please let pt know her lab results, she is still slightly anemic, iron level is normal, not sure why she is anemic. Last colonoscopy in 11/2015, and repeating in 3 years was recommended. Please let her contact her GI Dr. Collene Mares for appointment. Please encourage her to continue prenatal vitamins, thanks   Truitt Merle  02/10/2019

## 2019-02-11 NOTE — BH Specialist Note (Signed)
Integrated Behavioral Health Follow Up Visit  MRN: [id number] Name: Jenny [name] [facility]  Session Start time: 9:45  Session End time: 10:45 Total time: 1 hour Reason for follow-up: Continue brief intervention to assist patient with managing symptoms of anxiety and depression, as well as managing stressors .  Report of symptoms: feeling anxious, tired with little energy ; trouble relaxing, often shaking her hand or leg, easily annoyed. somewhat difficulty in daily functions. Often hard to get moving but finds that she is doing betting since adding Wellbutrin, she reports no side effects continues to take medication at prescribed by PCP and has not missed a dose. This is the 4th week full week for the 150mg  of Wellbutrin.  ASSESSMENT: Mood: Euthymic and Affect: Appropriate and Tearful at time when talking about wanting to get better;Thought process: Coherent;  No plan to harm self or others. reports having thoughts that it would better if she were not here.  Will not act on thoughts has strong faith has learned to replace this negative thought with a positive thought.  Patient is making progress towards goal. PHQ-9/GAD scores below have not shown a significant change however patient presents today well groomed, smiling and engaged in conversation.  She attended the [place] class and will be attending every Monday for the next 10 weeks. Patient has reservations about going due to stigmas, however is willing to go if this it going to help her get better.  Patient may benefit from, and is in agreement to continue ongoing assessment and brief therapeutic interventions to assist with managing her symptoms.   GOALS:Patient will: 1. Reduce symptoms of: anxiety and depression 2. Increase knowledge and/or ability of: coping skills and self-management skills  3.   Work on healthy eating and exercise PLAN/:Patient Self Care Activities:   1.Patient will F/U with LCSW in 2 weeks 2.Behavioral  recommendations: Behavioral Activation, relaxed breathing,  3. Attend class on Monday at [facility] 4. Will plan health snacks   Intervention: Patient interviewed and appropriate assessments performed and Provided patient with information about understanding A1C, low carb snacks, education on Diabetes ,Motivational Interviewing, Behavioral Therapy (Relaxed breathing); Psychoeducation and Supportive Counseling .  LCSW consulted with PCP.  Will also route note to Dr. Bernerd Pho, psychiatry.   GAD 7 : Generalized Anxiety Score 02/11/2019 01/27/2019 01/13/2019 12/30/2018  Nervous, Anxious, on Edge 3 2 3 2   Control/stop worrying 1 1 2 1   Worry too much - different things 1 1 1 1   Trouble relaxing 3 2 2 2   Restless 2 2 2 2   Easily annoyed or irritable 2 2 2 2   Afraid - awful might happen 0 1 1 1   Total GAD 7 Score 12 11 13 11   Anxiety Difficulty Very difficult Somewhat difficult Somewhat difficult -   Depression screen [facility] 2/9 02/11/2019 01/27/2019 01/13/2019  Decreased Interest 1 1 1   Down, Depressed, Hopeless 2 2 2   PHQ - 2 Score 3 3 3   Altered sleeping 1 1 1   Tired, decreased energy 3 2 2   Change in appetite 1 1 1   Feeling bad or failure about yourself  1 2 2   Trouble concentrating 1 2 2   Moving slowly or fidgety/restless 3 1 1   Suicidal thoughts 1 0 1  PHQ-9 Score 14 12 13   Difficult doing work/chores Somewhat difficult Somewhat difficult Somewhat difficult  Some recent data might be hidden   Jenny [name], [place]   [phone] 2:44 PM

## 2019-02-11 NOTE — Telephone Encounter (Signed)
Spoke with patient regarding lab results, per Dr. Burr Medico informed her she is slightly anemic, iron level is normal, we are unsure why she is anemic. Advised her it is time for colonoscopy, she states she has one schedule for beginning of March.  She is still taking the prenatal vitamins as recommended by Dr. Burr Medico.

## 2019-02-12 ENCOUNTER — Encounter: Payer: BC Managed Care – PPO | Admitting: Physical Therapy

## 2019-02-16 ENCOUNTER — Other Ambulatory Visit: Payer: Self-pay | Admitting: Family Medicine

## 2019-02-17 ENCOUNTER — Ambulatory Visit: Payer: BC Managed Care – PPO | Admitting: Physical Therapy

## 2019-02-17 ENCOUNTER — Encounter: Payer: Self-pay | Admitting: Physical Therapy

## 2019-02-17 DIAGNOSIS — R262 Difficulty in walking, not elsewhere classified: Secondary | ICD-10-CM | POA: Diagnosis not present

## 2019-02-17 DIAGNOSIS — R252 Cramp and spasm: Secondary | ICD-10-CM

## 2019-02-17 DIAGNOSIS — M6281 Muscle weakness (generalized): Secondary | ICD-10-CM

## 2019-02-17 DIAGNOSIS — M25652 Stiffness of left hip, not elsewhere classified: Secondary | ICD-10-CM

## 2019-02-17 DIAGNOSIS — M25651 Stiffness of right hip, not elsewhere classified: Secondary | ICD-10-CM

## 2019-02-17 NOTE — Therapy (Signed)
[facility] [address] [place], [place], [address] Phone: [phone]   Fax:  [phone]  Physical Therapy Treatment  Patient Details  Name: Jenny [name] MRN: [id number] Date of Birth: July 19, 1961 Referring Provider (PT): Truitt Merle, MD   Encounter Date: 02/17/2019  PT End of Session - 02/17/19 0851    Visit Number  6    Date for PT Re-Evaluation  04/16/19    Authorization Type  BCBS    PT Start Time  0849    PT Stop Time  0928    PT Time Calculation (min)  39 min    Activity Tolerance  Patient tolerated treatment well    Behavior During Therapy  [facility] for tasks assessed/performed       Past Medical History:  Diagnosis Date  . Anemia   . Cancer [facility])    colon cancer   . Enlarged thyroid     Past Surgical History:  Procedure Laterality Date  . BOWEL RESECTION    . CERVICAL SPINE SURGERY  06/17/2012   C5-C7 ACDF  . CESAREAN SECTION    . PARTIAL HYSTERECTOMY    . PORT-A-CATH REMOVAL Left 07/28/2015   Procedure: REMOVAL PORT-A-CATH;  Surgeon: [name], MD;  Location: [facility];  Service: General;  Laterality: Left;  . PORTACATH PLACEMENT Left 12/22/2014   Procedure: INSERTION PORT-A-CATH LEFT SUBCLAVIAN;  Surgeon: [name], MD;  Location: [facility];  Service: General;  Laterality: Left;  . TONSILLECTOMY      There were no vitals filed for this visit.  Subjective Assessment - 02/17/19 0852    Subjective  I had a couple times when I was out and had to go really bad, but I made it to the bathroom.  I feel like that is a little better.    Pertinent History  multiple abdominal surgeries, 3 c-sections, hx of sigmoid cancer    Patient Stated Goals  Be able to go out and not having to worry about rushing to the bathroom    Currently in Pain?  No/denies                       [facility] Adult PT Treatment/Exercise - 02/17/19 0001      Neuro Re-ed    Neuro Re-ed Details   biofeedback for educating  anal sphincter muscles      Lumbar Exercises: Standing   Other Standing Lumbar Exercises  weight shift with pelvic floor contraction side to side and fwd/back - 2 min each way 4 sec contract, 4 sec rest      Lumbar Exercises: Seated   Other Seated Lumbar Exercises  hip abduction green; and adduction     Other Seated Lumbar Exercises  ball squeeze 5 sec hold 5 sec rest - 15 x; kegel no ball in sitting variable contraction intensity and hold times               PT Short Term Goals - 02/05/19 1216      PT SHORT TERM GOAL #2   Title  pt will be ind with toileting techniques    Status  Achieved      PT SHORT TERM GOAL #3   Title  pt will be ind with moisturizing perineum for improved tissue health    Status  Achieved        PT Long Term Goals - 02/17/19 0926      PT LONG TERM GOAL #1  Title  pt will be ind with advanced HEP to continue to maintain her progress    Status  On-going      PT LONG TERM GOAL #2   Title  pt will be able to hold urge to void for at least 20 minutes in order to make it to a bathroom when she is out    Baseline  able to hold for 3 minutes and make it to rest room on time    Status  On-going      PT LONG TERM GOAL #3   Title  pt will demonstrate ability to tolerate assessment of pelvic floor soft tissues without pain due to improved ability to stretch and relax muscles    Status  On-going      PT LONG TERM GOAL #4   Title  Be able to walk 30 minutes without leakage in order exercise as part of healthy lifestyle for reduced risk of cancer    Status  On-going            Plan - 02/17/19 0926    Clinical Impression Statement  Pt has been improving with increased time to make it to rest room.  She demonstrates improved hold time of at least 5 seconds during exercises using biofeedback to assess and retrain her muscles.  Pt will continue to benefit from skilled PT to work towards maximizing functional goals.    PT Treatment/Interventions   ADLs/Self Care Home Management;Biofeedback;Cryotherapy;Electrical Stimulation;Moist Heat;Therapeutic activities;Therapeutic exercise;Neuromuscular re-education;Balance training;Patient/family education;Scar mobilization;Passive range of motion;Dry needling;Manual techniques;Taping    PT Next Visit Plan  internal assessment and STM as needed, biofeedback for control and endurance of anal sphincter muscles    PT Home Exercise Plan   Access Code: [email or web address]     [email or web address] and Agree with Plan of Care  Patient       Patient will benefit from skilled therapeutic intervention in order to improve the following deficits and impairments:  Pain, Abnormal gait, Increased fascial restricitons, Impaired tone, Increased muscle spasms, Decreased scar mobility, Decreased strength, Decreased range of motion, Impaired flexibility, Difficulty walking, Decreased balance, Decreased endurance, Postural dysfunction  Visit Diagnosis: Difficulty in walking, not elsewhere classified  Cramp and spasm  Stiffness of left hip, not elsewhere classified  Stiffness of right hip, not elsewhere classified  Muscle weakness (generalized)     Problem List Patient Active Problem List   Diagnosis Date Noted  . Diarrhea 02/10/2019  . Depression 09/17/2018  . Hypersomnolence 07/27/2018  . Pulmonary artery abnormality 07/01/2018  . Adjustment disorder with mixed anxiety and depressed mood 02/11/2018  . Back pain 12/25/2016  . Chemotherapy-induced peripheral neuropathy ([place]) 01/06/2016  . Poorly controlled diabetes mellitus ([place]) 12/07/2015  . Thyromegaly 12/14/2014  . Cancer of sigmoid colon metastatic to intra-abdominal lymph node ([place]) 11/14/2014  . Obesity 05/29/2009    Zannie Cove, PT 02/17/2019, 9:46 AM  [facility] [address], [place], [place], [address] Phone: [phone]   Fax:  [phone]  Name: Jenny [name] MRN: [id number] Date of Birth:  09/29/1961

## 2019-02-19 ENCOUNTER — Ambulatory Visit: Payer: BC Managed Care – PPO | Admitting: Physical Therapy

## 2019-02-19 ENCOUNTER — Encounter: Payer: Self-pay | Admitting: Physical Therapy

## 2019-02-19 DIAGNOSIS — M25651 Stiffness of right hip, not elsewhere classified: Secondary | ICD-10-CM

## 2019-02-19 DIAGNOSIS — R252 Cramp and spasm: Secondary | ICD-10-CM

## 2019-02-19 DIAGNOSIS — R262 Difficulty in walking, not elsewhere classified: Secondary | ICD-10-CM

## 2019-02-19 DIAGNOSIS — M25652 Stiffness of left hip, not elsewhere classified: Secondary | ICD-10-CM

## 2019-02-19 DIAGNOSIS — M6281 Muscle weakness (generalized): Secondary | ICD-10-CM

## 2019-02-19 NOTE — Therapy (Signed)
[facility] [address], [place], [place], [address] Phone: [phone]   Fax:  [phone]  Physical Therapy Treatment  Patient Details  Name: Jenny [name] MRN: [id number] Date of Birth: 07/23/1961 Referring Provider (PT): Truitt Merle, MD   Encounter Date: 02/19/2019  PT End of Session - 02/19/19 0935    Visit Number  7    Date for PT Re-Evaluation  04/16/19    Authorization Type  BCBS    PT Start Time  0934    PT Stop Time  1012    PT Time Calculation (min)  38 min    Activity Tolerance  Patient tolerated treatment well    Behavior During Therapy  [facility] for tasks assessed/performed       Past Medical History:  Diagnosis Date  . Anemia   . Cancer [facility])    colon cancer   . Enlarged thyroid     Past Surgical History:  Procedure Laterality Date  . BOWEL RESECTION    . CERVICAL SPINE SURGERY  06/17/2012   C5-C7 ACDF  . CESAREAN SECTION    . PARTIAL HYSTERECTOMY    . PORT-A-CATH REMOVAL Left 07/28/2015   Procedure: REMOVAL PORT-A-CATH;  Surgeon: [name], MD;  [facility]: [facility];  Service: General;  Laterality: Left;  . PORTACATH PLACEMENT Left 12/22/2014   Procedure: INSERTION PORT-A-CATH LEFT SUBCLAVIAN;  Surgeon: [name], MD;  Location: [facility];  Service: General;  Laterality: Left;  . TONSILLECTOMY      There were no vitals filed for this visit.  Subjective Assessment - 02/19/19 0936    Subjective  I felt like the exercises are going good.  I was able to hold from going to the bathroom for 5-8 minutes.    Patient Stated Goals  Be able to go out and not having to worry about rushing to the bathroom    Currently in Pain?  No/denies                       [facility] Adult PT Treatment/Exercise - 02/19/19 0001      Lumbar Exercises: Stretches   Active Hamstring Stretch  Right;Left;2 reps;30 seconds    Hip Flexor Stretch  Right;Left;2 reps;20 seconds    Gastroc Stretch  Right;Left;3  reps;20 seconds      Lumbar Exercises: Aerobic   Nustep  seat 9; L1 x 5 min      Lumbar Exercises: Standing   Row  Strengthening;Power tower;Both;20 reps;Limitations    Row Limitations  20#    Shoulder Extension  Strengthening;Power Tower;Both;20 reps;Limitations    Shoulder Extension Limitations  15#; with step back      Knee/Hip Exercises: Standing   Forward Step Up  Right;Left;20 reps;Step Height: 6"      side stepping with yellow band -20x Sit to stand with kegel - 15x         PT Short Term Goals - 02/05/19 1216      PT SHORT TERM GOAL #2   Title  pt will be ind with toileting techniques    Status  Achieved      PT SHORT TERM GOAL #3   Title  pt will be ind with moisturizing perineum for improved tissue health    Status  Achieved        PT Long Term Goals - 02/19/19 1011      PT LONG TERM GOAL #1   Title  pt will be ind with  advanced HEP to continue to maintain her progress    Status  On-going      PT LONG TERM GOAL #2   Title  pt will be able to hold urge to void for at least 20 minutes in order to make it to a bathroom when she is out    Baseline  up to8 minutes      PT LONG TERM GOAL #3   Title  pt will demonstrate ability to tolerate assessment of pelvic floor soft tissues without pain due to improved ability to stretch and relax muscles      PT LONG TERM GOAL #4   Title  Be able to walk 30 minutes without leakage in order exercise as part of healthy lifestyle for reduced risk of cancer    Baseline  walked in the park with grand kids for 45 minutes    Status  Achieved            Plan - 02/19/19 1013    Clinical Impression Statement  Pt has met goal for walking longer distances without leakage and urgency.  Pt is progressing with exercises and is able to hold for up to 8 minutes after the initial urge to use the bathroom.  Pt needs cues for functional activities on when to engage pelvic floor and not hold her breath.  Pt will benefit from skilled  PT to continue working towards functional goal.     PT Treatment/Interventions  ADLs/Self Care Home Management;Biofeedback;Cryotherapy;Electrical Stimulation;Moist Heat;Therapeutic activities;Therapeutic exercise;Neuromuscular re-education;Balance training;Patient/family education;Scar mobilization;Passive range of motion;Dry needling;Manual techniques;Taping    PT Next Visit Plan  biofeedback for improved endurance and control in various positions    PT Home Exercise Plan   Access Code: [email or web address]     Consulted and Agree with Plan of Care  Patient       Patient will benefit from skilled therapeutic intervention in order to improve the following deficits and impairments:  Pain, Abnormal gait, Increased fascial restricitons, Impaired tone, Increased muscle spasms, Decreased scar mobility, Decreased strength, Decreased range of motion, Impaired flexibility, Difficulty walking, Decreased balance, Decreased endurance, Postural dysfunction  Visit Diagnosis: Difficulty in walking, not elsewhere classified  Cramp and spasm  Stiffness of left hip, not elsewhere classified  Stiffness of right hip, not elsewhere classified  Muscle weakness (generalized)     Problem List Patient Active Problem List   Diagnosis Date Noted  . Diarrhea 02/10/2019  . Depression 09/17/2018  . Hypersomnolence 07/27/2018  . Pulmonary artery abnormality 07/01/2018  . Adjustment disorder with mixed anxiety and depressed mood 02/11/2018  . Back pain 12/25/2016  . Chemotherapy-induced peripheral neuropathy ([place]) 01/06/2016  . Poorly controlled diabetes mellitus ([place]) 12/07/2015  . Thyromegaly 12/14/2014  . Cancer of sigmoid colon metastatic to intra-abdominal lymph node ([place]) 11/14/2014  . Obesity 05/29/2009    Zannie Cove 02/19/2019, 10:18 AM  [facility] [address], [place], [place], [address] Phone: [phone]   Fax:  [phone]  Name:  Jenny [name] MRN: [id number] Date of Birth: 02-20-1961

## 2019-02-22 NOTE — Telephone Encounter (Signed)
LMOVM informing pt that form is ready for pickup at front desk. Fleeger, Salome Spotted, CMA   Copy placed in batch scanning. Fleeger, Salome Spotted, CMA

## 2019-02-24 ENCOUNTER — Ambulatory Visit: Payer: BC Managed Care – PPO | Attending: Hematology | Admitting: Physical Therapy

## 2019-02-24 ENCOUNTER — Encounter: Payer: Self-pay | Admitting: Physical Therapy

## 2019-02-24 ENCOUNTER — Ambulatory Visit: Payer: BC Managed Care – PPO | Admitting: Licensed Clinical Social Worker

## 2019-02-24 DIAGNOSIS — M25651 Stiffness of right hip, not elsewhere classified: Secondary | ICD-10-CM

## 2019-02-24 DIAGNOSIS — R252 Cramp and spasm: Secondary | ICD-10-CM | POA: Diagnosis present

## 2019-02-24 DIAGNOSIS — M6281 Muscle weakness (generalized): Secondary | ICD-10-CM | POA: Diagnosis present

## 2019-02-24 DIAGNOSIS — M25652 Stiffness of left hip, not elsewhere classified: Secondary | ICD-10-CM | POA: Diagnosis present

## 2019-02-24 DIAGNOSIS — R262 Difficulty in walking, not elsewhere classified: Secondary | ICD-10-CM | POA: Insufficient documentation

## 2019-02-24 DIAGNOSIS — F4323 Adjustment disorder with mixed anxiety and depressed mood: Secondary | ICD-10-CM

## 2019-02-24 NOTE — Patient Instructions (Addendum)
It was a pleasure seeing you today. You are going great. Today we discussed your goals and various options to assist you with accomplishing them.   Please continue with your wellness class on Mondays, relaxed breathing and walking 2 to 3 days a week, which is an increase from the current 1 to 2 days.  Practice relaxed breathing 3 times a day. Follow up with me in 2 weeks.

## 2019-02-24 NOTE — Therapy (Signed)
[facility] [address], [place], [place], [address] Phone: [phone]   Fax:  [phone]  Physical Therapy Treatment  Patient Details  Name: Jenny [name] MRN: [id number] Date of Birth: February 28, 1961 Referring Provider (PT): Truitt Merle, MD   Encounter Date: 02/24/2019  PT End of Session - 02/24/19 0856    Visit Number  8    Date for PT Re-Evaluation  04/16/19    Authorization Type  BCBS    PT Start Time  [phone]    PT Stop Time  0930    PT Time Calculation (min)  38 min    Activity Tolerance  Patient tolerated treatment well    Behavior During Therapy  [facility] for tasks assessed/performed       Past Medical History:  Diagnosis Date  . Anemia   . Cancer [facility])    colon cancer   . Enlarged thyroid     Past Surgical History:  Procedure Laterality Date  . BOWEL RESECTION    . CERVICAL SPINE SURGERY  06/17/2012   C5-C7 ACDF  . CESAREAN SECTION    . PARTIAL HYSTERECTOMY    . PORT-A-CATH REMOVAL Left 07/28/2015   Procedure: REMOVAL PORT-A-CATH;  Surgeon: [name], MD;  [facility];  Service: General;  Laterality: Left;  . PORTACATH PLACEMENT Left 12/22/2014   Procedure: INSERTION PORT-A-CATH LEFT SUBCLAVIAN;  Surgeon: [name], MD;  [facility];  Service: General;  Laterality: Left;  . TONSILLECTOMY      There were no vitals filed for this visit.  Subjective Assessment - 02/24/19 0901    Subjective  I walked one time, I still haven't had any accidents this week    Patient Stated Goals  Be able to go out and not having to worry about rushing to the bathroom    Currently in Pain?  No/denies                       [facility] Adult PT Treatment/Exercise - 02/24/19 0001      Lumbar Exercises: Aerobic   Nustep  seat 9; L1 x 8 min   cues to engage core and pelvic floor     Lumbar Exercises: Seated   Hip Flexion on Ball Limitations  sitting on mat - rolling blue ball for lumbar flexion -  5/10 sec    Other Seated Lumbar Exercises  hip flexion with kegel - 5 sec hold    Other Seated Lumbar Exercises  kegel with ball squeeze - 5/5sec      Knee/Hip Exercises: Standing   Hip Flexion  Stengthening;Right;Left;10 reps;Knee bent   2 lb   Hip Abduction  Stengthening;Right;Left;10 reps;Knee straight    Forward Step Up  Right;Left;20 reps;Step Height: 6"    SLS  alternating SLS on black foam mat               PT Short Term Goals - 02/05/19 1216      PT SHORT TERM GOAL #2   Title  pt will be ind with toileting techniques    Status  Achieved      PT SHORT TERM GOAL #3   Title  pt will be ind with moisturizing perineum for improved tissue health    Status  Achieved        PT Long Term Goals - 02/19/19 1011      PT LONG TERM GOAL #1   Title  pt will be ind with advanced  HEP to continue to maintain her progress    Status  On-going      PT LONG TERM GOAL #2   Title  pt will be able to hold urge to void for at least 20 minutes in order to make it to a bathroom when she is out    Baseline  up to8 minutes      PT LONG TERM GOAL #3   Title  pt will demonstrate ability to tolerate assessment of pelvic floor soft tissues without pain due to improved ability to stretch and relax muscles      PT LONG TERM GOAL #4   Title  Be able to walk 30 minutes without leakage in order exercise as part of healthy lifestyle for reduced risk of cancer    Baseline  walked in the park with grand kids for 45 minutes    Status  Achieved            Plan - 02/24/19 0931    Clinical Impression Statement  Pt is doing well with improved ability to control BM.  She was able to tolerate increased resistance and exercises today. Pt demonstrates some instability on unlevel surface and only able to barely lift other leg, but improved after several attempts.  She will continue to benefit form skilled PT to progress strength and stability.    PT Treatment/Interventions  ADLs/Self Care Home  Management;Biofeedback;Cryotherapy;Electrical Stimulation;Moist Heat;Therapeutic activities;Therapeutic exercise;Neuromuscular re-education;Balance training;Patient/family education;Scar mobilization;Passive range of motion;Dry needling;Manual techniques;Taping    PT Next Visit Plan  biofeedback for improved endurance and control in various positions    PT Home Exercise Plan   Access Code: [email or web address]     Consulted and Agree with Plan of Care  Patient       Patient will benefit from skilled therapeutic intervention in order to improve the following deficits and impairments:  Pain, Abnormal gait, Increased fascial restricitons, Impaired tone, Increased muscle spasms, Decreased scar mobility, Decreased strength, Decreased range of motion, Impaired flexibility, Difficulty walking, Decreased balance, Decreased endurance, Postural dysfunction  Visit Diagnosis: Difficulty in walking, not elsewhere classified  Cramp and spasm  Stiffness of left hip, not elsewhere classified  Stiffness of right hip, not elsewhere classified  Muscle weakness (generalized)     Problem List Patient Active Problem List   Diagnosis Date Noted  . Diarrhea 02/10/2019  . Depression 09/17/2018  . Hypersomnolence 07/27/2018  . Pulmonary artery abnormality 07/01/2018  . Adjustment disorder with mixed anxiety and depressed mood 02/11/2018  . Back pain 12/25/2016  . Chemotherapy-induced peripheral neuropathy ([place]) 01/06/2016  . Poorly controlled diabetes mellitus ([place]) 12/07/2015  . Thyromegaly 12/14/2014  . Cancer of sigmoid colon metastatic to intra-abdominal lymph node ([place]) 11/14/2014  . Obesity 05/29/2009    Jule Ser, PT 02/24/2019, 9:33 AM  [facility] [address] [place], [place], [address] Phone: [phone]   Fax:  [phone]  Name: Jenny [name] MRN: [id number] Date of Birth: 1961-09-06

## 2019-02-24 NOTE — BH Specialist Note (Signed)
Integrated Behavioral Health Follow Up Visit  MRN: [id number] Name: Jenny [name] [facility]  Session Start time: 11:30  Session End time: 12:30 Total time: 1 hour  Reason for follow-up: Continue brief intervention to assist patient with managing symptoms of anxiety and depression, as well as managing stressors .  Report of symptoms: feeling down ; being figety, trouble relaxing, becoming easily irritable and somewhat difficulty in daily functions.   ASSESSMENT: Mood: Euthymic and Affect: Appropriate and Tearful at times;Thought process: Coherent;  No plan to harm self or others however continues to have passive thoughts of better off not here to deal with what she is dealing with.  Patient continues to state her faith and family keep her from acting on thought.  Patient presented pleasant and engaged in conversation. She has attended her mental health wellness classes each Monday and is walking 1 to 2 days a week.  Patient also weighed in today and has decreased weight by 2lbs.  She continues talking medication daily and reports not missing a dose.   Patient continues to make slow progress towards goal.  States she has come a long way and is starting to feel better. This is also evident by slight decrease in PHQ-9/GAD scores below. Patient  Is hopeful today and discussed benefits of going to mental health wellness class. She is pleased with the result of adding [place]  And would like to continues with not changes.    Patient may benefit from, and is in agreement to continue ongoing brief therapeutic interventions and mental health wellness to assist with managing her symptoms.  GOALS:Patient will: 1. Reduce symptoms of: anxiety and depression 2. Increase knowledge and/or ability of: coping skills and self-management skills  3.   Exercise 3 times a week 4.   Decrease weight 5.   Health eating  PLAN: LSCW will forward note to PCP and Dr. Modesta Messing to review.  1.Patient will F/U with [facility] in 2  weeks 2.Behavioral recommendations: relaxed breathing, behavioral activation  3. Attend [place] classes every Monday 4. Walk 3 times a day and plan meals  Intervention: Patient interviewed and appropriate assessments performed. Other intervention include: ,Motivational Interviewing, Solution-Focused Strategies and Behavioral Activation, Reflective listening, Behavioral Therapy (Relaxed breathing); Psychoeducation    GAD 7 : Generalized Anxiety Score 02/24/2019 02/11/2019 01/27/2019 01/13/2019  Nervous, Anxious, on Edge 1 3 2 3   Control/stop worrying 1 1 1 2   Worry too much - different things 1 1 1 1   Trouble relaxing 2 3 2 2   Restless 2 2 2 2   Easily annoyed or irritable 2 2 2 2   Afraid - awful might happen 1 0 1 1  Total GAD 7 Score 10 12 11 13   Anxiety Difficulty Somewhat difficult Very difficult Somewhat difficult Somewhat difficult   Depression screen [facility] 2/9 02/24/2019 02/11/2019 01/27/2019  Decreased Interest 1 1 1   Down, Depressed, Hopeless 2 2 2   PHQ - 2 Score 3 3 3   Altered sleeping 2 1 1   Tired, decreased energy 2 3 2   Change in appetite 1 1 1   Feeling bad or failure about yourself  1 1 2   Trouble concentrating 1 1 2   Moving slowly or fidgety/restless 2 3 1   Suicidal thoughts 1 1 0  PHQ-9 Score 13 14 12   Difficult doing work/chores Somewhat difficult Somewhat difficult Somewhat difficult  Some recent data might be hidden   Tessa Seaberry, [place]   [phone] 4:38 PM

## 2019-02-26 ENCOUNTER — Ambulatory Visit: Payer: BC Managed Care – PPO | Admitting: Physical Therapy

## 2019-02-26 ENCOUNTER — Encounter: Payer: Self-pay | Admitting: Physical Therapy

## 2019-02-26 DIAGNOSIS — R252 Cramp and spasm: Secondary | ICD-10-CM

## 2019-02-26 DIAGNOSIS — M6281 Muscle weakness (generalized): Secondary | ICD-10-CM

## 2019-02-26 DIAGNOSIS — R262 Difficulty in walking, not elsewhere classified: Secondary | ICD-10-CM

## 2019-02-26 DIAGNOSIS — M25652 Stiffness of left hip, not elsewhere classified: Secondary | ICD-10-CM

## 2019-02-26 DIAGNOSIS — M25651 Stiffness of right hip, not elsewhere classified: Secondary | ICD-10-CM

## 2019-02-26 NOTE — Therapy (Signed)
[facility] [address], [place], [place], [address] Phone: [phone]   Fax:  [phone]  Physical Therapy Treatment  Patient Details  Name: Jenny [name] MRN: [id number] Date of Birth: 04-01-1961 Referring Provider (PT): Truitt Merle, MD   Encounter Date: 02/26/2019  PT End of Session - 02/26/19 0853    Visit Number  9    Date for PT Re-Evaluation  04/16/19    Authorization Type  BCBS    PT Start Time  0850    PT Stop Time  0929    PT Time Calculation (min)  39 min    Activity Tolerance  Patient tolerated treatment well    Behavior During Therapy  [facility] for tasks assessed/performed       Past Medical History:  Diagnosis Date  . Anemia   . Cancer [facility])    colon cancer   . Enlarged thyroid     Past Surgical History:  Procedure Laterality Date  . BOWEL RESECTION    . CERVICAL SPINE SURGERY  06/17/2012   C5-C7 ACDF  . CESAREAN SECTION    . PARTIAL HYSTERECTOMY    . PORT-A-CATH REMOVAL Left 07/28/2015   Procedure: REMOVAL PORT-A-CATH;  Surgeon: [name], MD;  Location: [facility];  Service: General;  Laterality: Left;  . PORTACATH PLACEMENT Left 12/22/2014   Procedure: INSERTION PORT-A-CATH LEFT SUBCLAVIAN;  Surgeon: [name], MD;  Location: [facility];  Service: General;  Laterality: Left;  . TONSILLECTOMY      There were no vitals filed for this visit.  Subjective Assessment - 02/26/19 1024    Subjective  Still holding it better and not having any accidents.    Pertinent History  multiple abdominal surgeries, 3 c-sections, hx of sigmoid cancer    Patient Stated Goals  Be able to go out and not having to worry about rushing to the bathroom    Currently in Pain?  No/denies                       [facility] Adult PT Treatment/Exercise - 02/26/19 0001      Neuro Re-ed    Neuro Re-ed Details   biofeedback, 6 sed work/rest - had difficulty relaxing in only 6 sec; 6/12 sec work/rest then able       Lumbar Exercises: Standing   Other Standing Lumbar Exercises  kegel for pelvic training using contract and relax programs mentioned above 3-87mV      Lumbar Exercises: Seated   Other Seated Lumbar Exercises  hip flexion with kegel - 6 sec hold    Other Seated Lumbar Exercises  kegel contract and relax cues to breathe      Lumbar Exercises: Supine   Large Ball Abdominal Isometric  15 reps    Large Ball Abdominal Isometric Limitations  red ball overhead with pelvic and TrA engaged - cue to exhale with squeeze               PT Short Term Goals - 02/05/19 1216      PT SHORT TERM GOAL #2   Title  pt will be ind with toileting techniques    Status  Achieved      PT SHORT TERM GOAL #3   Title  pt will be ind with moisturizing perineum for improved tissue health    Status  Achieved        PT Long Term Goals - 02/19/19 1011      PT  LONG TERM GOAL #1   Title  pt will be ind with advanced HEP to continue to maintain her progress    Status  On-going      PT LONG TERM GOAL #2   Title  pt will be able to hold urge to void for at least 20 minutes in order to make it to a bathroom when she is out    Baseline  up to8 minutes      PT LONG TERM GOAL #3   Title  pt will demonstrate ability to tolerate assessment of pelvic floor soft tissues without pain due to improved ability to stretch and relax muscles      PT LONG TERM GOAL #4   Title  Be able to walk 30 minutes without leakage in order exercise as part of healthy lifestyle for reduced risk of cancer    Baseline  walked in the park with grand kids for 45 minutes    Status  Achieved            Plan - 02/26/19 1027    Clinical Impression Statement  Pt improved her ability to control pelvic contract/relax by using biofeedback. She continues to need work to coordinate and breath without engaged Radio broadcast assistant. She is able to range contract-relax from 10-3 mV at this time.  pt will cotinue to benefit from skilled PT to  improved endurance and muscle coordination.    PT Treatment/Interventions  ADLs/Self Care Home Management;Biofeedback;Cryotherapy;Electrical Stimulation;Moist Heat;Therapeutic activities;Therapeutic exercise;Neuromuscular re-education;Balance training;Patient/family education;Scar mobilization;Passive range of motion;Dry needling;Manual techniques;Taping    PT Next Visit Plan  biofeedback for improved endurance and control in various positions and improved endurance    Consulted and Agree with Plan of Care  Patient       Patient will benefit from skilled therapeutic intervention in order to improve the following deficits and impairments:  Pain, Abnormal gait, Increased fascial restricitons, Impaired tone, Increased muscle spasms, Decreased scar mobility, Decreased strength, Decreased range of motion, Impaired flexibility, Difficulty walking, Decreased balance, Decreased endurance, Postural dysfunction  Visit Diagnosis: Difficulty in walking, not elsewhere classified  Cramp and spasm  Stiffness of left hip, not elsewhere classified  Stiffness of right hip, not elsewhere classified  Muscle weakness (generalized)     Problem List Patient Active Problem List   Diagnosis Date Noted  . Diarrhea 02/10/2019  . Depression 09/17/2018  . Hypersomnolence 07/27/2018  . Pulmonary artery abnormality 07/01/2018  . Adjustment disorder with mixed anxiety and depressed mood 02/11/2018  . Back pain 12/25/2016  . Chemotherapy-induced peripheral neuropathy ([place]) 01/06/2016  . Poorly controlled diabetes mellitus ([place]) 12/07/2015  . Thyromegaly 12/14/2014  . Cancer of sigmoid colon metastatic to intra-abdominal lymph node ([place]) 11/14/2014  . Obesity 05/29/2009    Jule Ser, PT 02/26/2019, 10:36 AM  [facility] [address] [place], [place], [address] Phone: [phone]   Fax:  [phone]  Name: LILLYBETH TAL MRN:  [id number] Date of Birth: March 08, 1961

## 2019-03-03 ENCOUNTER — Ambulatory Visit: Payer: BC Managed Care – PPO | Admitting: Physical Therapy

## 2019-03-03 ENCOUNTER — Ambulatory Visit: Payer: Self-pay | Admitting: Licensed Clinical Social Worker

## 2019-03-03 ENCOUNTER — Other Ambulatory Visit: Payer: Self-pay

## 2019-03-03 ENCOUNTER — Encounter: Payer: Self-pay | Admitting: Physical Therapy

## 2019-03-03 DIAGNOSIS — M6281 Muscle weakness (generalized): Secondary | ICD-10-CM

## 2019-03-03 DIAGNOSIS — R262 Difficulty in walking, not elsewhere classified: Secondary | ICD-10-CM

## 2019-03-03 DIAGNOSIS — M25651 Stiffness of right hip, not elsewhere classified: Secondary | ICD-10-CM

## 2019-03-03 DIAGNOSIS — F4323 Adjustment disorder with mixed anxiety and depressed mood: Secondary | ICD-10-CM

## 2019-03-03 DIAGNOSIS — M25652 Stiffness of left hip, not elsewhere classified: Secondary | ICD-10-CM

## 2019-03-03 DIAGNOSIS — R252 Cramp and spasm: Secondary | ICD-10-CM

## 2019-03-03 NOTE — Progress Notes (Signed)
[facility] Health Initiative ([place]) Psychiatric Consultant Case Review   Although there has been steady improvement, she has residual symptoms of depression which includes fatigue. Will consider uptitration of bupropion to optimize treatment for depression if the patient is interested. [place] specialist to discuss this option with the patient.   Recommendation - Will consider uptitration of bupropion to 300 mg daily if the patient is interested. [place] specialist will provide psycho-education (monitor side effect, which includes but not limited to headache, worsening in anxiety)  - [place] specialist to continue to coach behavioral activation   Thank you for your consult. We will continue to follow the patient. Please contact [place]  for any questions or concerns.   The above treatment considerations and suggestions are based on consultation with the [facility] specialist and/or PCP and a review of information available in the shared registry and the patient's [place] Record ([name]). I have not personally examined the patient. All recommendations should be implemented with consideration of the patient's relevant prior history and current clinical status. Please feel free to call me with any questions about the care of this patient.

## 2019-03-03 NOTE — BH Specialist Note (Signed)
Integrated Behavioral Health Treatment Planning Team  MRN: [id number] NAME: Jenny [name]  DATE: 03/03/19  Start time: 1:00 End time: 1:20 Total time: 20 minutes Total number of [facility] Treatment Team Plan encounters: 5  Treatment Team Attendees: [name], LCSW and Dr. [name].  Diagnoses: depression and anxiety Goals, Interventions and Follow-up Plan Goals: Patient will: Increase motivation to adhere to plan of care Improve medication compliance and reduce symptoms of: anxiety depression and also Increase knowledge and/or ability of:  coping skills self-management skills Interventions: Motivational Interviewing Solution-Focused Strategies Behavioral Activation  Standardized Assessments Completed: GAD-7 PHQ 9  Medication Management Recommendations:  See note and recommendation from Dr. Modesta Messing  ( Recommendation- Will consider uptitration of bupropion to 300 mg daily if the patient is interested. [place] specialist will provide psycho-education (monitor side effect, which includes but not limited to headache, worsening in anxiety)  Follow up Plan: Patient will meet with LCSW on March 18th and will continue weekly group at [place] Referral(s): none at this time  History of the present illness Presenting Problem/Current Symptoms: feeling down ; being figety, trouble relaxing, becoming easily irritable and somewhat difficulty in daily functions.  Notable from MMSE: NA  Screenings PHQ-9 Assessments:  Depression screen [facility] 2/9 02/24/2019 02/11/2019 01/27/2019  Decreased Interest 1 1 1   Down, Depressed, Hopeless 2 2 2   PHQ - 2 Score 3 3 3   Altered sleeping 2 1 1   Tired, decreased energy 2 3 2   Change in appetite 1 1 1   Feeling bad or failure about yourself  1 1 2   Trouble concentrating 1 1 2   Moving slowly or fidgety/restless 2 3 1   Suicidal thoughts 1 1 0  PHQ-9 Score 13 14 12   Difficult doing work/chores Somewhat difficult Somewhat difficult Somewhat  difficult  Some recent data might be hidden   GAD-7 Assessments:  GAD 7 : Generalized Anxiety Score 02/24/2019 02/11/2019 01/27/2019 01/13/2019  Nervous, Anxious, on Edge 1 3 2 3   Control/stop worrying 1 1 1 2   Worry too much - different things 1 1 1 1   Trouble relaxing 2 3 2 2   Restless 2 2 2 2   Easily annoyed or irritable 2 2 2 2   Afraid - awful might happen 1 0 1 1  Total GAD 7 Score 10 12 11 13   Anxiety Difficulty Somewhat difficult Very difficult Somewhat difficult Somewhat difficult    Treatment Barriers: Patient continues to feel tired daily. She wants to accomplish goals set each week but continues to have difficulty accomplishing them.  Strengths/Protective Factors: Strong faith, grandchildren, Husband and daughter.  Demographics/Context: 58 y.o.year old femalewith history of depression, diabetes, sigmoid colon cancer, stage IIIB adenocarcinoma, s/p chemotherapy, partial resection, peripheral neuropathy after chemotherapy.  Social History:  Social History   Socioeconomic History  . Marital status: Married    Spouse name: Not on file  . Number of children: Not on file  . Years of education: Not on file  . Highest education level: Not on file  Occupational History  . Not on file  Social Needs  . Financial resource strain: Not on file  . Food insecurity:    Worry: Not on file    Inability: Not on file  . Transportation needs:    Medical: Not on file    Non-medical: Not on file  Tobacco Use  . Smoking status: Never Smoker  . Smokeless tobacco: Never Used  Substance and Sexual Activity  . Alcohol use: No  . Drug use: No  .  Sexual activity: Not on file  Lifestyle  . Physical activity:    Days per week: Not on file    Minutes per session: Not on file  . Stress: Not on file  Relationships  . Social connections:    Talks on phone: Not on file    Gets together: Not on file    Attends religious service: Not on file    Active member of club or organization: Not on  file    Attends meetings of clubs or organizations: Not on file    Relationship status: Not on file  Other Topics Concern  . Not on file  Social History Narrative  . Not on file    Past Medical History Medical History:  Past Medical History:  Diagnosis Date  . Anemia   . Cancer [name])    colon cancer   . Enlarged thyroid    Allergies:  Allergies as of 03/03/2019 - Review Complete 02/10/2019  Allergen Reaction Noted  . Other Rash 12/22/2014  . Shellfish allergy Rash 12/05/2014   Labs:  Recent Results (from the past 2160 hour(s))  Comprehensive metabolic panel     Status: Abnormal   Collection Time: 12/28/18  3:09 PM  Result Value Ref Range   Sodium 143 135 - 145 mmol/L   Potassium 3.9 3.5 - 5.1 mmol/L   Chloride 109 98 - 111 mmol/L   CO2 26 22 - 32 mmol/L   Glucose, Bld 101 (H) 70 - 99 mg/dL   BUN 11 6 - 20 mg/dL   Creatinine, Ser 0.77 0.44 - 1.00 mg/dL   Calcium 9.5 8.9 - 10.3 mg/dL   Total Protein 7.5 6.5 - 8.1 g/dL   Albumin 3.9 3.5 - 5.0 g/dL   AST 16 15 - 41 U/L   ALT 21 0 - 44 U/L   Alkaline Phosphatase 70 38 - 126 U/L   Total Bilirubin 0.4 0.3 - 1.2 mg/dL   GFR calc non Af Amer >60 >60 mL/min   GFR calc Af Amer >60 >60 mL/min   Anion gap 8 5 - 15    Comment: Performed at [facility], [address], [place], [place] [address]  CBC with Differential     Status: Abnormal   Collection Time: 12/28/18  3:09 PM  Result Value Ref Range   WBC 6.1 4.0 - 10.5 K/uL   RBC 4.44 3.87 - 5.11 MIL/uL   Hemoglobin 11.5 (L) 12.0 - 15.0 g/dL   HCT 36.7 36.0 - 46.0 %   MCV 82.7 80.0 - 100.0 fL   MCH 25.9 (L) 26.0 - 34.0 pg   MCHC 31.3 30.0 - 36.0 g/dL   RDW 15.9 (H) 11.5 - 15.5 %   Platelets 276 150 - 400 K/uL   nRBC 0.0 0.0 - 0.2 %   Neutrophils Relative % 51 %   Neutro Abs 3.1 1.7 - 7.7 K/uL   Lymphocytes Relative 43 %   Lymphs Abs 2.6 0.7 - 4.0 K/uL   Monocytes Relative 4 %   Monocytes Absolute 0.2 0.1 - 1.0 K/uL   Eosinophils Relative 2  %   Eosinophils Absolute 0.1 0.0 - 0.5 K/uL   Basophils Relative 0 %   Basophils Absolute 0.0 0.0 - 0.1 K/uL   Immature Granulocytes 0 %   Abs Immature Granulocytes 0.02 0.00 - 0.07 K/uL    Comment: Performed at [facility], [place] [address] [place], [place] [address]  CEA (IN HOUSE-[facility])  Status: None   Collection Time: 12/28/18  3:09 PM  Result Value Ref Range   CEA ([facility]-In House) 1.05 0.00 - 5.00 ng/mL    Comment: Performed at [facility], [place] [address], [place], [place] [address]  HgB A1c     Status: Abnormal   Collection Time: 12/30/18  9:30 AM  Result Value Ref Range   Hemoglobin A1C     HbA1c POC (<> result, manual entry)     HbA1c, POC (prediabetic range)     HbA1c, POC (controlled diabetic range) 8.9 (A) 0.0 - 7.0 %  Glucose (CBG)     Status: Abnormal   Collection Time: 01/27/19 11:43 AM  Result Value Ref Range   POC Glucose 163 (A) 70 - 99 mg/dl  Iron and TIBC     Status: Abnormal   Collection Time: 02/08/19  4:05 PM  Result Value Ref Range   Iron 44 41 - 142 ug/dL   TIBC 288 236 - 444 ug/dL   Saturation Ratios 15 (L) 21 - 57 %   UIBC 244 120 - 384 ug/dL    Comment: Performed at [facility], [place] [address] [place], [place] [address]  Ferritin     Status: None   Collection Time: 02/08/19  4:05 PM  Result Value Ref Range   Ferritin 91 11 - 307 ng/mL    Comment: Performed at [facility], [place] [address] [place], [place] [address]  CEA (IN HOUSE-[facility])     Status: None   Collection Time: 02/08/19  4:05 PM  Result Value Ref Range   CEA ([facility]-In House) 1.15 0.00 - 5.00 ng/mL    Comment: (NOTE) This test was performed using Architect's Chemiluminescent Microparticle Immunoassay. Values obtained from different assay methods cannot be used interchangeably. Please note that 5-10% of patients who smoke may see CEA levels up to 6.9 ng/mL. Performed at [facility], [place] [address], [place], [place] [address]   Comprehensive metabolic panel     Status: Abnormal   Collection Time: 02/08/19  4:05 PM  Result Value Ref Range   Sodium 140 135 - 145 mmol/L   Potassium 3.9 3.5 - 5.1 mmol/L   Chloride 104 98 - 111 mmol/L   CO2 25 22 - 32 mmol/L   Glucose, Bld 164 (H) 70 - 99 mg/dL   BUN 14 6 - 20 mg/dL   Creatinine, Ser 0.85 0.44 - 1.00 mg/dL   Calcium 10.0 8.9 - 10.3 mg/dL   Total Protein 7.7 6.5 - 8.1 g/dL   Albumin 4.1 3.5 - 5.0 g/dL   AST 12 (L) 15 - 41 U/L   ALT 15 0 - 44 U/L   Alkaline Phosphatase 73 38 - 126 U/L   Total Bilirubin 0.3 0.3 - 1.2 mg/dL   GFR calc non Af Amer >60 >60 mL/min   GFR calc Af Amer >60 >60 mL/min   Anion gap 11 5 - 15    Comment: Performed at [facility], [place] [address], [place], [place] [address]  CBC with Differential     Status: Abnormal   Collection Time: 02/08/19  4:05 PM  Result Value Ref Range   WBC 6.6 4.0 - 10.5 K/uL   RBC 4.49 3.87 - 5.11 MIL/uL   Hemoglobin 11.6 (L) 12.0 - 15.0 g/dL   HCT 36.9 36.0 - 46.0 %   MCV 82.2 80.0 - 100.0 fL   MCH 25.8 (L)  26.0 - 34.0 pg   MCHC 31.4 30.0 - 36.0 g/dL   RDW 16.5 (H) 11.5 - 15.5 %   Platelets 306 150 - 400 K/uL   nRBC 0.0 0.0 - 0.2 %   Neutrophils Relative % 57 %   Neutro Abs 3.8 1.7 - 7.7 K/uL   Lymphocytes Relative 37 %   Lymphs Abs 2.4 0.7 - 4.0 K/uL   Monocytes Relative 4 %   Monocytes Absolute 0.3 0.1 - 1.0 K/uL   Eosinophils Relative 2 %   Eosinophils Absolute 0.1 0.0 - 0.5 K/uL   Basophils Relative 0 %   Basophils Absolute 0.0 0.0 - 0.1 K/uL   Immature Granulocytes 0 %   Abs Immature Granulocytes 0.02 0.00 - 0.07 K/uL    Comment: Performed at [facility], [place] [address] [place], [place] [address]    Medication History: Current medications:  Outpatient Encounter Medications as of 03/03/2019  Medication Sig  . acetaminophen (TYLENOL) 500 MG tablet Take 500 mg by mouth  every 6 (six) hours as needed.  Marland Kitchen buPROPion (WELLBUTRIN XL) 150 MG 24 hr tablet TAKE 1 TABLET BY MOUTH EVERY DAY  . fluticasone (FLONASE) 50 MCG/ACT nasal spray Place 2 sprays into both nostrils daily.  Marland Kitchen gabapentin (NEURONTIN) 300 MG capsule Take 2 capsules (600 mg total) by mouth 3 (three) times daily.  . hydrocortisone cream 0.5 % Apply 1 application topically 2 (two) times daily as needed for itching.  . loratadine (CLARITIN) 10 MG tablet Take 10 mg by mouth daily. AS NEEDED  . metFORMIN (GLUCOPHAGE-XR) 750 MG 24 hr tablet Take 1 tablet (750 mg total) by mouth 2 (two) times daily.  . Multiple Vitamin (MULTIVITAMIN) tablet Take 1 tablet by mouth daily.  . sertraline (ZOLOFT) 100 MG tablet Take 1.5 tablets (150 mg total) by mouth daily.   No facility-administered encounter medications on file as of 03/03/2019.     Scribe for Treatment Team: Maurine Cane, LCSW  [facility]   [phone] 3:00 PM

## 2019-03-03 NOTE — Therapy (Signed)
[facility] [address] [place], [place], [address] Phone: [phone]   Fax:  [phone]  Physical Therapy Treatment  Patient Details  Name: Jenny [name] MRN: [id number] Date of Birth: [birth year] Referring Provider (PT): Truitt Merle, MD   Encounter Date: 03/03/2019  PT End of Session - 03/03/19 0857    Visit Number  10    Date for PT Re-Evaluation  04/16/19    Authorization Type  BCBS    PT Start Time  [phone]    PT Stop Time  0930    PT Time Calculation (min)  38 min    Activity Tolerance  Patient tolerated treatment well    Behavior During Therapy  [facility] for tasks assessed/performed       Past Medical History:  Diagnosis Date  . Anemia   . Cancer [facility])    colon cancer   . Enlarged thyroid     Past Surgical History:  Procedure Laterality Date  . BOWEL RESECTION    . CERVICAL SPINE SURGERY  06/17/2012   C5-C7 ACDF  . CESAREAN SECTION    . PARTIAL HYSTERECTOMY    . PORT-A-CATH REMOVAL Left 07/28/2015   Procedure: REMOVAL PORT-A-CATH;  Surgeon: [name], MD;  Location: [facility];  Service: General;  Laterality: Left;  . PORTACATH PLACEMENT Left 12/22/2014   Procedure: INSERTION PORT-A-CATH LEFT SUBCLAVIAN;  Surgeon: [name], MD;  Location: [facility];  Service: General;  Laterality: Left;  . TONSILLECTOMY      There were no vitals filed for this visit.  Subjective Assessment - 03/03/19 0900    Subjective  I am doing good today    Patient Stated Goals  Be able to go out and not having to worry about rushing to the bathroom    Currently in Pain?  No/denies                       [facility] Adult PT Treatment/Exercise - 03/03/19 0001      Lumbar Exercises: Aerobic   Stationary Bike  L4 x 6 min   PT present for status update     Lumbar Exercises: Standing   Functional Squats  20 reps;3 seconds   exhale and kegel and cane behind back   Other Standing Lumbar Exercises  sliders side front  and back - 15x each side    Other Standing Lumbar Exercises  standing on foam mat - toe taps, NBOS, tandem               PT Short Term Goals - 02/05/19 1216      PT SHORT TERM GOAL #2   Title  pt will be ind with toileting techniques    Status  Achieved      PT SHORT TERM GOAL #3   Title  pt will be ind with moisturizing perineum for improved tissue health    Status  Achieved        PT Long Term Goals - 02/19/19 1011      PT LONG TERM GOAL #1   Title  pt will be ind with advanced HEP to continue to maintain her progress    Status  On-going      PT LONG TERM GOAL #2   Title  pt will be able to hold urge to void for at least 20 minutes in order to make it to a bathroom when she is out    Baseline  up to8 minutes  PT LONG TERM GOAL #3   Title  pt will demonstrate ability to tolerate assessment of pelvic floor soft tissues without pain due to improved ability to stretch and relax muscles      PT LONG TERM GOAL #4   Title  Be able to walk 30 minutes without leakage in order exercise as part of healthy lifestyle for reduced risk of cancer    Baseline  walked in the park with grand kids for 45 minutes    Status  Achieved            Plan - 03/03/19 0859    Clinical Impression Statement  Pt was able to demonstrate squat and correct breathing technique with a lot of cues initially and then able to do on her own at the end of treatment.  Pt started to get fatigued and had hard time balancing on foam mat with increased nerve pain when fatigue from this activity. Pt will benefit from skilled PT to conitnue working on pelvic floor eccentric strength and endurance and balance    PT Treatment/Interventions  ADLs/Self Care Home Management;Biofeedback;Cryotherapy;Electrical Stimulation;Moist Heat;Therapeutic activities;Therapeutic exercise;Neuromuscular re-education;Balance training;Patient/family education;Scar mobilization;Passive range of motion;Dry needling;Manual  techniques;Taping    PT Next Visit Plan  balance and eccentric strength of PF, biofeedback for improved endurance and control in various positions and improved endurance    PT Home Exercise Plan   Access Code: [email or web address]     Consulted and Agree with Plan of Care  Patient       Patient will benefit from skilled therapeutic intervention in order to improve the following deficits and impairments:  Pain, Abnormal gait, Increased fascial restricitons, Impaired tone, Increased muscle spasms, Decreased scar mobility, Decreased strength, Decreased range of motion, Impaired flexibility, Difficulty walking, Decreased balance, Decreased endurance, Postural dysfunction  Visit Diagnosis: Difficulty in walking, not elsewhere classified  Cramp and spasm  Stiffness of left hip, not elsewhere classified  Stiffness of right hip, not elsewhere classified  Muscle weakness (generalized)     Problem List Patient Active Problem List   Diagnosis Date Noted  . Diarrhea 02/10/2019  . Depression 09/17/2018  . Hypersomnolence 07/27/2018  . Pulmonary artery abnormality 07/01/2018  . Adjustment disorder with mixed anxiety and depressed mood 02/11/2018  . Back pain 12/25/2016  . Chemotherapy-induced peripheral neuropathy ([place]) 01/06/2016  . Poorly controlled diabetes mellitus ([place]) 12/07/2015  . Thyromegaly 12/14/2014  . Cancer of sigmoid colon metastatic to intra-abdominal lymph node ([place]) 11/14/2014  . Obesity 05/29/2009    Camillo Flaming Aren Cherne,PT 03/03/2019, 10:15 AM  [facility] [address] [place], [place], [address] Phone: [phone]   Fax:  [phone]  Name: Jenny [name] MRN: [id number] Date of Birth: 09/22/1961

## 2019-03-05 ENCOUNTER — Encounter: Payer: Self-pay | Admitting: Physical Therapy

## 2019-03-05 ENCOUNTER — Other Ambulatory Visit: Payer: Self-pay

## 2019-03-05 ENCOUNTER — Ambulatory Visit: Payer: BC Managed Care – PPO | Admitting: Physical Therapy

## 2019-03-05 DIAGNOSIS — R252 Cramp and spasm: Secondary | ICD-10-CM

## 2019-03-05 DIAGNOSIS — M6281 Muscle weakness (generalized): Secondary | ICD-10-CM

## 2019-03-05 DIAGNOSIS — R262 Difficulty in walking, not elsewhere classified: Secondary | ICD-10-CM | POA: Diagnosis not present

## 2019-03-05 DIAGNOSIS — M25651 Stiffness of right hip, not elsewhere classified: Secondary | ICD-10-CM

## 2019-03-05 DIAGNOSIS — M25652 Stiffness of left hip, not elsewhere classified: Secondary | ICD-10-CM

## 2019-03-05 NOTE — Therapy (Addendum)
[facility] [address], [place], [place], [address] Phone: [phone]   Fax:  [phone]  Physical Therapy Treatment  Patient Details  Name: RIAN KOON MRN: [id number] Date of Birth: 1961/03/20 Referring Provider (PT): Truitt Merle, MD   Encounter Date: 03/05/2019  PT End of Session - 03/05/19 0919    Visit Number  11    Date for PT Re-Evaluation  04/16/19    Authorization Type  BCBS    PT Start Time  [phone]   arrived late   PT Stop Time  0925    PT Time Calculation (min)  32 min    Activity Tolerance  Patient tolerated treatment well    Behavior During Therapy  [facility] for tasks assessed/performed       Past Medical History:  Diagnosis Date  . Anemia   . Cancer [facility])    colon cancer   . Enlarged thyroid     Past Surgical History:  Procedure Laterality Date  . BOWEL RESECTION    . CERVICAL SPINE SURGERY  06/17/2012   C5-C7 ACDF  . CESAREAN SECTION    . PARTIAL HYSTERECTOMY    . PORT-A-CATH REMOVAL Left 07/28/2015   Procedure: REMOVAL PORT-A-CATH;  Surgeon: [name], MD;  [facility];  Service: General;  Laterality: Left;  . PORTACATH PLACEMENT Left 12/22/2014   Procedure: INSERTION PORT-A-CATH LEFT SUBCLAVIAN;  Surgeon: [name], MD;  Location: [facility];  Service: General;  Laterality: Left;  . TONSILLECTOMY      There were no vitals filed for this visit.  Subjective Assessment - 03/05/19 0857    Subjective  Pt reports she is doing well today    Pertinent History  multiple abdominal surgeries, 3 c-sections, hx of sigmoid cancer    Patient Stated Goals  Be able to go out and not having to worry about rushing to the bathroom    Currently in Pain?  No/denies                       [facility] Adult PT Treatment/Exercise - 03/05/19 0001      Lumbar Exercises: Stretches   Active Hamstring Stretch  Right;Left;2 reps;30 seconds      Lumbar Exercises: Aerobic   Nustep  L6 x 8 min    PT present for status update     Lumbar Exercises: Standing   Functional Squats  20 reps;3 seconds   exhale and kegel and holding 8lb     Lumbar Exercises: Supine   Large Ball Abdominal Isometric  15 reps    Large Ball Abdominal Isometric Limitations  red ball overhead and roll out with pelvic and TrA engaged - cue to exhale with squeeze               PT Short Term Goals - 02/05/19 1216      PT SHORT TERM GOAL #2   Title  pt will be ind with toileting techniques    Status  Achieved      PT SHORT TERM GOAL #3   Title  pt will be ind with moisturizing perineum for improved tissue health    Status  Achieved        PT Long Term Goals - 02/19/19 1011      PT LONG TERM GOAL #1   Title  pt will be ind with advanced HEP to continue to maintain her progress    Status  On-going  PT LONG TERM GOAL #2   Title  pt will be able to hold urge to void for at least 20 minutes in order to make it to a bathroom when she is out    Baseline  up to8 minutes      PT LONG TERM GOAL #3   Title  pt will demonstrate ability to tolerate assessment of pelvic floor soft tissues without pain due to improved ability to stretch and relax muscles      PT LONG TERM GOAL #4   Title  Be able to walk 30 minutes without leakage in order exercise as part of healthy lifestyle for reduced risk of cancer    Baseline  walked in the park with grand kids for 45 minutes    Status  Achieved            Plan - 03/05/19 0925    Clinical Impression Statement  Pt demonstrated good form with squat today and needed only one reminder to breath correctly with exercises.  Continue to porgress core and pelvic floor strengthening is recommended so patient can achieve all functional goals.    PT Treatment/Interventions  ADLs/Self Care Home Management;Biofeedback;Cryotherapy;Electrical Stimulation;Moist Heat;Therapeutic activities;Therapeutic exercise;Neuromuscular re-education;Balance training;Patient/family  education;Scar mobilization;Passive range of motion;Dry needling;Manual techniques;Taping    PT Next Visit Plan  balance and eccentric strength of PF, biofeedback for improved endurance and control in various positions and improved endurance    PT Home Exercise Plan   Access Code: [email or web address]     Consulted and Agree with Plan of Care  Patient       Patient will benefit from skilled therapeutic intervention in order to improve the following deficits and impairments:  Pain, Abnormal gait, Increased fascial restricitons, Impaired tone, Increased muscle spasms, Decreased scar mobility, Decreased strength, Decreased range of motion, Impaired flexibility, Difficulty walking, Decreased balance, Decreased endurance, Postural dysfunction  Visit Diagnosis: Difficulty in walking, not elsewhere classified  Cramp and spasm  Stiffness of left hip, not elsewhere classified  Stiffness of right hip, not elsewhere classified  Muscle weakness (generalized)     Problem List Patient Active Problem List   Diagnosis Date Noted  . Diarrhea 02/10/2019  . Depression 09/17/2018  . Hypersomnolence 07/27/2018  . Pulmonary artery abnormality 07/01/2018  . Adjustment disorder with mixed anxiety and depressed mood 02/11/2018  . Back pain 12/25/2016  . Chemotherapy-induced peripheral neuropathy ([place]) 01/06/2016  . Poorly controlled diabetes mellitus ([place]) 12/07/2015  . Thyromegaly 12/14/2014  . Cancer of sigmoid colon metastatic to intra-abdominal lymph node ([place]) 11/14/2014  . Obesity 05/29/2009    Camillo Flaming Meer Reindl 03/05/2019, 9:26 AM  [facility] [address] [place], [place], [address] Phone: [phone]   Fax:  [phone]  Name: JIMESHA RISING MRN: [id number] Date of Birth: 01/21/1961  PHYSICAL THERAPY DISCHARGE SUMMARY  Visits from Start of Care: 11  Current functional level related to goals / functional outcomes: See above    Remaining deficits: See above   Education / Equipment: HEP  Plan: Patient agrees to discharge.  Patient goals were partially met. Patient is being discharged due to not returning since the last visit.  ?????    [employer], PT 05/21/19 2:00 PM

## 2019-03-08 ENCOUNTER — Ambulatory Visit: Payer: BC Managed Care – PPO | Admitting: Physical Therapy

## 2019-03-10 ENCOUNTER — Ambulatory Visit: Payer: BC Managed Care – PPO

## 2019-03-10 ENCOUNTER — Ambulatory Visit: Payer: Self-pay | Admitting: Physical Therapy

## 2019-03-10 ENCOUNTER — Telehealth: Payer: Self-pay | Admitting: Licensed Clinical Social Worker

## 2019-03-10 NOTE — Telephone Encounter (Signed)
03/10/2019 Jenny [name] [id number]   LCSW called patient to cancel office visit to limit exposure to the novel corona virus. Offered patient a phone conference on day and time that is convenient for her.   Patient reports overall doing well. Had a recent death with a close friend.   LCSW offered emotional support.   Plan: Patient will call LCSW back for phone conference this week or next week.  [name], LCSW [facility]   [phone] 9:12 AM

## 2019-03-12 ENCOUNTER — Encounter: Payer: BC Managed Care – PPO | Admitting: Physical Therapy

## 2019-03-13 ENCOUNTER — Other Ambulatory Visit: Payer: Self-pay | Admitting: Family Medicine

## 2019-03-18 ENCOUNTER — Telehealth: Payer: Self-pay | Admitting: Licensed Clinical Social Worker

## 2019-03-18 ENCOUNTER — Ambulatory Visit: Payer: BC Managed Care – PPO

## 2019-03-18 ENCOUNTER — Encounter: Payer: Self-pay | Admitting: Licensed Clinical Social Worker

## 2019-03-18 NOTE — Telephone Encounter (Signed)
Phone outreach to patient to see if she wants to schedule a phone outreach visit.   Phone outreach scheduled for 2:00 to day.  [name], [facility]   [phone] 10:36 AM

## 2019-03-18 NOTE — Telephone Encounter (Signed)
Thanks. It would be great if you could CC note to me each time you see the patient so that I can make an addendum as needed.

## 2019-03-18 NOTE — Telephone Encounter (Signed)
IBH phone visit see note below.  [name], [place]   [phone] 3:42 PM

## 2019-03-18 NOTE — BH Specialist Note (Signed)
   Telephone Outreach Note  Session Start time: 2:00  Session End time: 2:40 Total time: 40 minutes  Referring Provider: Dr. Erin Hearing Type of Visit: Telephonic Jenny [name] location: her home [employer] Provider location: LCSW's home All persons participating in visit: [name], and Gladis Riffle  Confirmed Jenny [name]'s address: Yes  Confirmed Jenny [name]'s phone number: Yes  Any changes to demographics: No  Any changes to Jenny [name]'s insurance: No  Discussed confidentiality: Yes   Jenny following statements were read to Jenny Jenny [name] and/or legal guardian that are established with Jenny [facility] Provider. "Jenny purpose of this phone visit is to provide behavioral health care while limiting exposure to Jenny coronavirus (COVID19). " "By engaging in this telephone visit, you consent to Jenny provision of healthcare."  Jenny [name] consented to telephone visit: Yes   ASSESSMENT: Mood: a little down and Affect: Appropriate.  Jenny [name] continues to take Wellbutrin and Zoloft daily.  She is laughing more, starting to sing around Jenny house and talking positive. However she continues to have difficulty with feeling tired , not wanting to get up in Jenny morning, feeling frustrated, fidgety and overwhelmed.  Jenny [name] reports having a positive outlook and is willing to increase her medication if it will make her feel better.   She misses going to her Mental Health classes on Monday which was discontinued due to covid-19. Jenny [name] continues to experience symptoms of depression which seem to be exacerbated by Covid19 and having her grandchildren at home during Jenny day. This is also reflected in PHQ-9 scores below.  Jenny [name] positive with passive SI several times. Manages this thought by not dwelling on it, keeping positive thoughts, and utilizing her faith.  Jenny [name] has no plan and reports she would not act on this thought.  Jenny [name] may benefit from and is in agreement to continue further brief therapeutic interventions to assist  with managing her symptoms.  GOALS: 1. Continue to reduce symptoms of: anxiety, depression and stress 2. Increase knowledge and/or ability of: coping skills, healthy habits, self-management skills and stress reduction   Interventions: Client interviewed and appropriate assessments performed, also utilized engagement as part of my intervention.  Other interventions include:  Solution-Focused Strategies and Supportive Counseling,  Problem-solving teaching/coping strategies and Psychoeducation  as well as SDOH (Social Determinants of Health) screening performed related to challenges with: Depression   Stress  Depression screen [facility] 2/9 03/18/2019 02/24/2019 02/11/2019  Decreased Interest 1 1 1   Down, Depressed, Hopeless 1 2 2   PHQ - 2 Score 2 3 3   Altered sleeping 3 2 1   Tired, decreased energy 3 2 3   Change in appetite 2 1 1   Feeling bad or failure about yourself  2 1 1   Trouble concentrating 1 1 1   Moving slowly or fidgety/restless 3 2 3   Suicidal thoughts 1 1 1   PHQ-9 Score 17 13 14   Difficult doing work/chores Somewhat difficult Somewhat difficult Somewhat difficult  Some recent data might be hidden   Plan:  1. Jenny [name] will set schedule for self and grandchildren (plan indoor activities)  2.  Take medication Jenny same time each day.  3.  Exercise in Jenny house as she has not been able to get out and walk. 4.  F/U with LCSW in 2 weeks  Chambliss, Jeb Levering, MD and Dr. Richarda Overlie have been notified of this outreach and Ms. Arlet Marter [facility] plan and Jenny [name] being open to increasing Wellbutin.  [name], LCSW [facility]   [phone] 3:15 PM

## 2019-03-22 ENCOUNTER — Telehealth (INDEPENDENT_AMBULATORY_CARE_PROVIDER_SITE_OTHER): Payer: BC Managed Care – PPO | Admitting: Family Medicine

## 2019-03-22 DIAGNOSIS — E1165 Type 2 diabetes mellitus with hyperglycemia: Secondary | ICD-10-CM | POA: Diagnosis not present

## 2019-03-22 DIAGNOSIS — F4323 Adjustment disorder with mixed anxiety and depressed mood: Secondary | ICD-10-CM

## 2019-03-22 NOTE — Telephone Encounter (Signed)
Earnings and short term disability form dropped off for at front desk for completion.  Verified that patient section of form has been completed.  Last DOS/WCC with PCP was 02/10/19.  Placed form in team folder to be completed by clinical staff.  Crista Luria

## 2019-03-22 NOTE — Telephone Encounter (Signed)
Pt returning our call about cancelling her appt with Dr. Erin Hearing. Pt would like to speak with Chambliss and possibly get a virtual visit because she feels that she needs one. Please call pt back to discuss.

## 2019-03-23 NOTE — Assessment & Plan Note (Signed)
Unsure of control. She will get an A1c when she comes to pick up her disability papers.  Tolerating metformin

## 2019-03-23 NOTE — Telephone Encounter (Signed)
Pt informed that form was completed and ready for pick up.  She will come in for A1C check tomorrow. Form given to [employer]. Christen Bame, CMA

## 2019-03-23 NOTE — Assessment & Plan Note (Signed)
Seems stable but not improving which is not surprising given the Covid stress.  She knows her medications and reports taking regularly but not on schedule. She suggested trying to drink water instead of stress eat and to exercise more - perhaps taking a walk or at least inside

## 2019-03-23 NOTE — Telephone Encounter (Signed)
[facility] Visit  Patient consented to have visit conducted via telephone.  Encounter participants: Patient: Jenny [name]  Provider: Lind Covert  Others (if applicable): none  Chief Complaint: FOLLOW UP  HPI:  Diabetes Not checking blood sugar.  Unsure about her weight but has been stress eating.  Taking metformin twice a day with mild diarrhea.  No low blood sugar like spells  Depression Thinks is "ok" Having dreams about dead relatives but she feels she is handling all the Covid stress reasonably well.  Much better than if had happened last year.  Sleep is stable.  Not getting our of the house or exercising   Pertinent PMHx: See PL  Exam:  Normal conversation Psych:  Cognition and judgment appear intact. Alert, communicative  and cooperative with normal attention span and concentration. No apparent delusions, illusions, hallucinations   Assessment/Plan:  Poorly controlled diabetes mellitus ([place]) Unsure of control. She will get an A1c when she comes to pick up her disability papers.  Tolerating metformin   Adjustment disorder with mixed anxiety and depressed mood Seems stable but not improving which is not surprising given the Covid stress.  She knows her medications and reports taking regularly but not on schedule. She suggested trying to drink water instead of stress eat and to exercise more - perhaps taking a walk or at least inside     Time spent on phone with patient: 15 minutes

## 2019-03-24 ENCOUNTER — Ambulatory Visit: Payer: BC Managed Care – PPO | Admitting: Family Medicine

## 2019-03-24 ENCOUNTER — Other Ambulatory Visit: Payer: BC Managed Care – PPO

## 2019-03-24 ENCOUNTER — Telehealth: Payer: Self-pay | Admitting: Family Medicine

## 2019-03-24 ENCOUNTER — Other Ambulatory Visit: Payer: Self-pay

## 2019-03-24 LAB — POCT GLYCOSYLATED HEMOGLOBIN (HGB A1C): HbA1c, POC (controlled diabetic range): 7.5 % — AB (ref 0.0–7.0)

## 2019-03-24 NOTE — Telephone Encounter (Signed)
Talked with her about weight and A1 both are improved  She is very happy  Will talk next week about increasing her wellbutrin - she would like ot think about it

## 2019-03-28 ENCOUNTER — Other Ambulatory Visit: Payer: Self-pay | Admitting: Family Medicine

## 2019-03-29 MED ORDER — METFORMIN HCL ER 750 MG PO TB24: 750.0000 mg | ORAL_TABLET | Freq: Two times a day (BID) | ORAL | 1 refills | Status: DC

## 2019-03-29 NOTE — Addendum Note (Signed)
Addended by: Christen Bame D on: 03/29/2019 09:29 AM   Modules accepted: Orders

## 2019-03-29 NOTE — Telephone Encounter (Signed)
Transmission failed, resent.  Jenny [name], CMA  

## 2019-04-01 ENCOUNTER — Ambulatory Visit: Payer: BC Managed Care – PPO | Admitting: Licensed Clinical Social Worker

## 2019-04-01 ENCOUNTER — Other Ambulatory Visit: Payer: Self-pay

## 2019-04-01 DIAGNOSIS — F32A Depression, unspecified: Secondary | ICD-10-CM

## 2019-04-01 DIAGNOSIS — F329 Major depressive disorder, single episode, unspecified: Secondary | ICD-10-CM

## 2019-04-01 NOTE — BH Specialist Note (Signed)
Integrated Behavioral Health Phone outreach Follow Up   MRN: [id number] Name: Jenny [name] [facility]  Session Start time: 2:00  Session End time: 2:50 Total time: 50 minutes  Referring Provider: Dr. Erin Hearing Type of Visit: Telephonic Patient location: home LCSW  location: home All persons participating in visit: patient and LCSW  Confirmed patient's address: Yes  Confirmed patient's phone number: Yes  Discussed confidentiality: Yes   The following statements were read to the patient and/or legal guardian that are established with the [facility] Provider. "The purpose of this phone visit is to provide behavioral health care while limiting exposure to the coronavirus (COVID19). " "By engaging in this telephone visit, you consent to the provision of healthcare."  Patient consented to telephone visit: Yes   Reason for follow-up: Continue brief intervention to assist patient with managing symptoms of anxiety and depression, as well as managing stressors .  Report of symptoms: feeling anxious ; tired, stressed, frustrated, nervous and shaky.  Binge eating difficulty in focus and daily functions.   ASSESSMENT: Mood: Irritable and Affect: Appropriate. Thought process: Coherent;  No plan or thought to harm self or others. Passive thoughts at times of better off not here. Does not dwell on thought, focuses on faith and family.   Patient continues to make slow progress with managing symptoms, she tries to exercise, eat healthy and set a schedule but has had difficulty with motivation to stay on task.  She takes her medication daily however has not been consistent with taking it the same time each day. Patient is excited with decrease in A1C and 5lb weight loss.  She has made progress with being able to read the bible and pray, in the past this was difficult for her do.  Symptoms discussed today seem to be exacerbated by grandchildren at home and not being able to get out.   Patient may benefit from,  and is in agreement to continue to work on brief therapeutic interventions to assist with managing her symptoms.  Patient is also open to increasing Wellbutrin.   STRENGTHS (Protective Factors/Coping Skills): Spends time reading her bible, praying and doing puzzles.  Finds that she does not retain information she read which frustrates her. Supportive husband, grandchildren and church family, GOALS: 1. Reduce symptoms of: agitation, anxiety, depression and stress 2. Increase ability of: coping skills, healthy habits, self-management skills and stress reduction   Intervention: Client interviewed and appropriate assessments performed. Provided client with information about online class at [facility], also utilized engagement as part of my intervention.  Other interventions include:  Solution-Focused Strategies,  Problem-solving teaching/coping strategies .  Slight decrease in PHQ-9 scores below. Depression screen [facility] 2/9 04/01/2019 03/18/2019 02/24/2019  Decreased Interest 1 1 1   Down, Depressed, Hopeless 2 1 2   PHQ - 2 Score 3 2 3   Altered sleeping 1 3 2   Tired, decreased energy 2 3 2   Change in appetite 3 2 1   Feeling bad or failure about yourself  1 2 1   Trouble concentrating 1 1 1   Moving slowly or fidgety/restless 2 3 2   Suicidal thoughts 1 1 1   PHQ-9 Score 14 17 13   Difficult doing work/chores Somewhat difficult Somewhat difficult Somewhat difficult  Some recent data might be hidden   Plan: Next appointment with LCSW in 2 weeks 1. Schedule exercise 3 days a week for 15 min. 2.  Work on relaxed breathing daily 3.  Self-care with improved hygiene  4.  Attend on-line Mental Health Class once per  week. 5. Set timer for medication same time daily  Chambliss, Jeb Levering, MD and Dr. Modesta Messing have been notified of this outreach and  Ms. Camarie Mctigue [facility] plan.   [name], [facility]   [phone] 3:40 PM

## 2019-04-07 ENCOUNTER — Ambulatory Visit (INDEPENDENT_AMBULATORY_CARE_PROVIDER_SITE_OTHER): Payer: BC Managed Care – PPO | Admitting: Licensed Clinical Social Worker

## 2019-04-07 DIAGNOSIS — F32A Depression, unspecified: Secondary | ICD-10-CM

## 2019-04-07 DIAGNOSIS — F329 Major depressive disorder, single episode, unspecified: Secondary | ICD-10-CM

## 2019-04-07 NOTE — BH Specialist Note (Signed)
Integrated Behavioral Health Treatment Planning Team  MRN: [id number] NAME: Jenny [name]  DATE: 04/07/19  Start time: 1:05 End time: 1:25 Total time: 20 minutes Total number of Virtual [place] Treatment Team Plan encounters: 6  Treatment Team Attendees: [name] & Dr. [name]  Diagnoses:    ICD-10-CM   1. Depression, unspecified depression type F32.9     Goals, Interventions and Follow-up Plan Goals: Patient will: Increase motivation to adhere to plan of care Improve medication compliance and reduce symptoms of: agitation anxiety depression stress and also Increase knowledge and/or ability of:  coping skills healthy habits self-management skills stress reduction Interventions: Solution-Focused Strategies and Behavioral Activation   Standardized Assessments Completed: PHQ-9  Medication Management Recommendations: See note from Dr. Bernerd Pho. (Discussed the case. No change in recommendation documented on 3/4. Although there has been steady improvement, patient continues to has residual symptoms of depression which includes fatigue.   Recommendation from 02/24/19 - Will consider uptitration of bupropion to 300 mg daily if the patient is interested. [facility] specialist will provide psycho-education (monitor side effect, which includes but not limited to headache, worsening in anxiety)   Follow up Plan: Patient has phone appointment with LCSW 04/16/19.  PCP will also F/U with patient to discuss possible increase in bupropion.   Referral(s): continue with [facility] and Group at [facility].  History of the present illness Presenting Problem/Current Symptoms: Patient continues to have symptoms of depression and anxiety, interferes with her daily function.  feeling anxious ; tired, stressed, frustrated, nervous , shaky, difficult with focus and Binge eating. Patient is open to increase bupropion. PCP will discuss this with her further.     Screenings PHQ-9 Assessments:  Depression screen [facility] 2/9 04/01/2019 03/18/2019 02/24/2019  Decreased Interest 1 1 1   Down, Depressed, Hopeless 2 1 2   PHQ - 2 Score 3 2 3   Altered sleeping 1 3 2   Tired, decreased energy 2 3 2   Change in appetite 3 2 1   Feeling bad or failure about yourself  1 2 1   Trouble concentrating 1 1 1   Moving slowly or fidgety/restless 2 3 2   Suicidal thoughts 1 1 1   PHQ-9 Score 14 17 13   Difficult doing work/chores Somewhat difficult Somewhat difficult Somewhat difficult  Some recent data might be hidden   GAD-7 Assessments:  GAD 7 : Generalized Anxiety Score 02/24/2019 02/11/2019 01/27/2019 01/13/2019  Nervous, Anxious, on Edge 1 3 2 3   Control/stop worrying 1 1 1 2   Worry too much - different things 1 1 1 1   Trouble relaxing 2 3 2 2   Restless 2 2 2 2   Easily annoyed or irritable 2 2 2 2   Afraid - awful might happen 1 0 1 1  Total GAD 7 Score 10 12 11 13   Anxiety Difficulty Somewhat difficult Very difficult Somewhat difficult Somewhat difficult   Treatment Barriers: Patient continues to feel tired daily and easily frustracted. This prevents her from accomplishing goals set weekly.  Strengths/Protective Factors: Strong faith, grandchildren, Husband and daughter.  Demographics/Context: 58 y.o.year old femalewith history of depression, diabetes, sigmoid colon cancer, stage IIIB adenocarcinoma, s/p chemotherapy, partial resection, peripheral neuropathy after chemotherapy.  Social History: Lives with spouse, and 3 teenage grandchildren.   On medical leave from working at a [employer], [employer].  Social History   Socioeconomic History  . Marital status: Married    Spouse name: Not on file  . Number of children: Not on file  . Years of education:  Not on file  . Highest education level: Not on file  Occupational History  . Not on file  Social Needs  . Financial resource strain: Not on file  . Food insecurity:    Worry: Never true    Inability: Never true  .  Transportation needs:    Medical: No    Non-medical: No  Tobacco Use  . Smoking status: Never Smoker  . Smokeless tobacco: Never Used  Substance and Sexual Activity  . Alcohol use: No  . Drug use: No  . Sexual activity: Not on file  Lifestyle  . Physical activity:    Days per week: Not on file    Minutes per session: Not on file  . Stress: To some extent  Relationships  . Social connections:    Talks on phone: Not on file    Gets together: Not on file    Attends religious service: Not on file    Active member of club or organization: Not on file    Attends meetings of clubs or organizations: Not on file    Relationship status: Not on file  Other Topics Concern  . Not on file  Social History Narrative  . Not on file    Past Medical History Medical History:  Past Medical History:  Diagnosis Date  . Anemia   . Cancer [facility])    colon cancer   . Enlarged thyroid    Allergies:  Allergies as of 04/07/2019 - Review Complete 02/10/2019  Allergen Reaction Noted  . Other Rash 12/22/2014  . Shellfish allergy Rash 12/05/2014   Labs:  Recent Results (from the past 2160 hour(s))  Glucose (CBG)     Status: Abnormal   Collection Time: 01/27/19 11:43 AM  Result Value Ref Range   POC Glucose 163 (A) 70 - 99 mg/dl  Iron and TIBC     Status: Abnormal   Collection Time: 02/08/19  4:05 PM  Result Value Ref Range   Iron 44 41 - 142 ug/dL   TIBC 288 236 - 444 ug/dL   Saturation Ratios 15 (L) 21 - 57 %   UIBC 244 120 - 384 ug/dL    Comment: Performed at [facility], [place] [address], [place], [place] [address]  Ferritin     Status: None   Collection Time: 02/08/19  4:05 PM  Result Value Ref Range   Ferritin 91 11 - 307 ng/mL    Comment: Performed at [facility], [place] [address] [place], [place] [address]  CEA (IN HOUSE-[facility])     Status: None   Collection Time: 02/08/19  4:05 PM  Result Value Ref Range   CEA ([facility]-In House) 1.15  0.00 - 5.00 ng/mL    Comment: (NOTE) This test was performed using Architect's Chemiluminescent Microparticle Immunoassay. Values obtained from different assay methods cannot be used interchangeably. Please note that 5-10% of patients who smoke may see CEA levels up to 6.9 ng/mL. Performed at [facility], [place] [address] [place],  [address]   Comprehensive metabolic panel     Status: Abnormal   Collection Time: 02/08/19  4:05 PM  Result Value Ref Range   Sodium 140 135 - 145 mmol/L   Potassium 3.9 3.5 - 5.1 mmol/L   Chloride 104 98 - 111 mmol/L   CO2 25 22 - 32 mmol/L   Glucose, Bld 164 (H) 70 - 99 mg/dL   BUN 14 6 - 20 mg/dL   Creatinine,  Ser 0.85 0.44 - 1.00 mg/dL   Calcium 10.0 8.9 - 10.3 mg/dL   Total Protein 7.7 6.5 - 8.1 g/dL   Albumin 4.1 3.5 - 5.0 g/dL   AST 12 (L) 15 - 41 U/L   ALT 15 0 - 44 U/L   Alkaline Phosphatase 73 38 - 126 U/L   Total Bilirubin 0.3 0.3 - 1.2 mg/dL   GFR calc non Af Amer >60 >60 mL/min   GFR calc Af Amer >60 >60 mL/min   Anion gap 11 5 - 15    Comment: Performed at [facility], [place] [address], [place], [place] [address]  CBC with Differential     Status: Abnormal   Collection Time: 02/08/19  4:05 PM  Result Value Ref Range   WBC 6.6 4.0 - 10.5 K/uL   RBC 4.49 3.87 - 5.11 MIL/uL   Hemoglobin 11.6 (L) 12.0 - 15.0 g/dL   HCT 36.9 36.0 - 46.0 %   MCV 82.2 80.0 - 100.0 fL   MCH 25.8 (L) 26.0 - 34.0 pg   MCHC 31.4 30.0 - 36.0 g/dL   RDW 16.5 (H) 11.5 - 15.5 %   Platelets 306 150 - 400 K/uL   nRBC 0.0 0.0 - 0.2 %   Neutrophils Relative % 57 %   Neutro Abs 3.8 1.7 - 7.7 K/uL   Lymphocytes Relative 37 %   Lymphs Abs 2.4 0.7 - 4.0 K/uL   Monocytes Relative 4 %   Monocytes Absolute 0.3 0.1 - 1.0 K/uL   Eosinophils Relative 2 %   Eosinophils Absolute 0.1 0.0 - 0.5 K/uL   Basophils Relative 0 %   Basophils Absolute 0.0 0.0 - 0.1 K/uL   Immature Granulocytes 0 %   Abs Immature  Granulocytes 0.02 0.00 - 0.07 K/uL    Comment: Performed at [facility], [place] [address] [place], [place] [address]  POCT glycosylated hemoglobin (Hb A1C)     Status: Abnormal   Collection Time: 03/24/19 11:02 AM  Result Value Ref Range   Hemoglobin A1C     HbA1c POC (<> result, manual entry)     HbA1c, POC (prediabetic range)     HbA1c, POC (controlled diabetic range) 7.5 (A) 0.0 - 7.0 %    Medication History: Current medications:  Outpatient Encounter Medications as of 04/07/2019  Medication Sig  . acetaminophen (TYLENOL) 500 MG tablet Take 500 mg by mouth every 6 (six) hours as needed.  Marland Kitchen buPROPion (WELLBUTRIN XL) 150 MG 24 hr tablet TAKE 1 TABLET BY MOUTH EVERY DAY  . fluticasone (FLONASE) 50 MCG/ACT nasal spray Place 2 sprays into both nostrils daily.  Marland Kitchen gabapentin (NEURONTIN) 300 MG capsule Take 2 capsules (600 mg total) by mouth 3 (three) times daily.  . hydrocortisone cream 0.5 % Apply 1 application topically 2 (two) times daily as needed for itching.  . loratadine (CLARITIN) 10 MG tablet Take 10 mg by mouth daily. AS NEEDED  . metFORMIN (GLUCOPHAGE-XR) 750 MG 24 hr tablet Take 1 tablet (750 mg total) by mouth 2 (two) times daily.  . Multiple Vitamin (MULTIVITAMIN) tablet Take 1 tablet by mouth daily.  . sertraline (ZOLOFT) 100 MG tablet Take 1.5 tablets (150 mg total) by mouth daily.   No facility-administered encounter medications on file as of 04/07/2019.     Scribe for Treatment Team: Maurine Cane, LCSW  [facility]   [phone] 1:44 PM

## 2019-04-07 NOTE — Progress Notes (Signed)
Discussed the case. No change in recommendation documented on 3/4.

## 2019-04-08 ENCOUNTER — Telehealth: Payer: Self-pay | Admitting: Family Medicine

## 2019-04-08 NOTE — Telephone Encounter (Signed)
Sleepy when called Feels "ok" Not exercising or active as she would like Would like to increase wellbutrin Will have her take 150 twice a day She will call back with name of pharmacy near by She will call if any problems or worsening

## 2019-04-09 MED ORDER — BUPROPION HCL ER (XL) 150 MG PO TB24: 150.0000 mg | ORAL_TABLET | Freq: Two times a day (BID) | ORAL | 1 refills | Status: DC

## 2019-04-12 ENCOUNTER — Telehealth: Payer: Self-pay | Admitting: *Deleted

## 2019-04-12 MED ORDER — METFORMIN HCL ER (MOD) 1000 MG PO TB24: 1000.0000 mg | ORAL_TABLET | Freq: Two times a day (BID) | ORAL | 1 refills | Status: DC

## 2019-04-12 NOTE — Telephone Encounter (Signed)
Received fax from pharmacy that metformin RX is on backorder.  Can we call in something else? Christen Bame, CMA

## 2019-04-14 ENCOUNTER — Telehealth: Payer: Self-pay | Admitting: Family Medicine

## 2019-04-14 NOTE — Telephone Encounter (Signed)
Reported earnings for short term disability form dropped off for at front desk for completion.  Verified that patient section of form has been completed.  Last DOS/WCC with PCP was 02/10/19.  Placed form in team folder to be completed by clinical staff.  Crista Luria  Needs to be dated 04/23/19

## 2019-04-14 NOTE — Telephone Encounter (Signed)
Clinical info completed on Short Term Disability form.  Place form in PCP's box for completion. Routed to PCP Salvatore Marvel, [place]

## 2019-04-16 ENCOUNTER — Other Ambulatory Visit: Payer: Self-pay

## 2019-04-16 ENCOUNTER — Telehealth: Payer: BC Managed Care – PPO | Admitting: Licensed Clinical Social Worker

## 2019-04-16 DIAGNOSIS — F4323 Adjustment disorder with mixed anxiety and depressed mood: Secondary | ICD-10-CM

## 2019-04-16 NOTE — BH Specialist Note (Signed)
Integrated Behavioral Health Follow Up Visit  MRN: [id number] Name: Jenny [name] [facility]  Session Start time: 2:50  Session End time: 3:00 Total time: 55 min   Reason for follow-up: Continue brief intervention to assist patient with managing symptoms of anxiety and depression, as well as managing stressors.   Report of symptoms: not shaking as much, still feeling tired but able to get up and make bed daily, able to do chores a little better, having less thoughts of SI, sleeping better even had dreams. ; still some difficulty in daily functions but seen some improvement.  Reports no negative side effects.   ASSESSMENT: Mood: Euthymic and Affect: Appropriate;Thought process: Coherent;  No plan to harm self or others.  Patient is making progress towards goal.  States she is starting to feel a little better. Patient started increase in Wellbutrin April 17th.   During this encounter LCSW noticed patient is more engaged and talkative today.  She is less detracted and able to gather thoughts better.  Patient continues to have symptoms that impact her daily function but is motivated today to set goals and try to stick to them. Patient was unsuccessful with accomplish items listed on her previous plan( exercising and reconnecting with on-line Mental health group,) she may benefit from, and is in agreement to continue ongoing assessment and brief therapeutic interventions to assist with managing her symptoms.  STRENGTHS: ongoing support from family and church. GOALS:Patient will: 1. Reduce symptoms of: anxiety, depression and stress 2. Increase knowledge and/or ability of: coping skills, healthy habits, self-management skills and stress reduction  3.   Healthy habits with eating and routine exercise  PLAN:  1.Patient will F/U with [facility] in 2 weeks 2.Behavioral recommendations: relaxed breathing daily 3.Schedule exercise 3 days a week for 15 min. 4. Self-care with improved hygiene   Intervention: Client  interviewed and appropriate assessments performed. Provided client with information about healthy eating, scheduling meals, importance of having a daily routine, benefits of behavioral activation and self-care ; also utilized engagement as part of my intervention.  Other interventions include:,Motivational Interviewing and Behavioral Activation, Reflective listening; Problem-solving teaching/coping strategies and Psychoeducation   Dr. Erin Hearing and Dr. Modesta Messing has been notified of this outreach.  [name], [facility]   [phone] 3:23 PM

## 2019-04-28 NOTE — Telephone Encounter (Signed)
Copy made for batch scanning and informed patient her copy is ready for pick up.

## 2019-04-29 ENCOUNTER — Ambulatory Visit (INDEPENDENT_AMBULATORY_CARE_PROVIDER_SITE_OTHER): Payer: BC Managed Care – PPO | Admitting: Licensed Clinical Social Worker

## 2019-04-29 DIAGNOSIS — F4323 Adjustment disorder with mixed anxiety and depressed mood: Secondary | ICD-10-CM

## 2019-04-29 NOTE — BH Specialist Note (Signed)
   Integrated Care Telephone Outreach Note  Start time: 2:30  End time: 3:00 Total time: 30 minutes  Referring Provider: Dr. Erin Hearing  Type of Visit: Telephonic [place] L[place]W  location: home All persons participating in visit: patient and LCSW  Confirmed patient's location: Yes  Confirmed patient's birthdate: Yes  Discussed confidentiality: Yes   "The purpose of this phone visit is to provide behavioral health care while limiting exposure to the coronavirus (COVID19). " "By engaging in this telephone visit, you consent to the provision of healthcare."  Patient consented to telephone visit: Yes   Patient Reports:  Starting to have small burst of energy, as a result does more and increased pain in her hands the next few days for over doing things; still feels low; often wanting to stay in bed; decrease in appetite but increase in eating junk food; still feeling frustrated with self; Doing more positive self talk, reading bible, feels like she is better able to control anger and does not get upset as easily; decrease thoughts of would be better off not here. No thoughts or plans to harm self. Difficult sleeping at times when overdoing things.  ASSESSMENT: Patient seems to be a little anxious and down during the call today. Feeling tired and overwhelmed with being in the house, has made little progress on goals that were set two weeks ago, continues to take medication daily however still not consistent with taking it the same time each day. Patient continues to experiencing symptoms of depression and does not seem to have made much improvement  in the past few weeks.  This is the 3rd week with the increase in Wellbutrin.  Patient may benefit from and is in agreement to continue assessment and brief therapeutic interventions to assist with managing her symptoms.  Interventions: Review of patient status, including notes from care team members was performed as part of  visit.  Client interviewed and appropriate assessments performed. Provided client with information about PACING when she has energy, review of previous goal.  Other interventions include:  Motivational Interviewing, Solution-Focused Strategies and Supportive Counseling,  Problem-solving teaching/coping strategies. Will continue with previous goals and consult with PCP and Psych for next steps.  STRENGTHS: ongoing support from family. GOALS:Patient will: 1. Reduce symptoms of: anxiety, depression and stress 2. Increase knowledge and/or ability of: coping skills, healthy habits, self-management skills and stress reduction  3.   Healthy habits with eating and routine exercise  PLAN:  1.Patient will F/U with [facility] in 1 week 2.Behavioral recommendations: relaxed breathing daily 3.Schedule exercise 3 days a week for 15 min. 4.Self-care with improved hygiene   [place], Jeb Levering, MD and Dr. Denyce Robert have been notified of this outreach and Jenny [name] [facility] plan.   [name], LCSW [facility]   [phone] 3:25 PM

## 2019-05-05 ENCOUNTER — Ambulatory Visit: Payer: Self-pay | Admitting: Licensed Clinical Social Worker

## 2019-05-05 DIAGNOSIS — F32A Depression, unspecified: Secondary | ICD-10-CM

## 2019-05-05 DIAGNOSIS — F329 Major depressive disorder, single episode, unspecified: Secondary | ICD-10-CM

## 2019-05-05 NOTE — Progress Notes (Signed)
No change in medication recommendation at this time. Will check if she experiences any side effect from uptitration of bupropion. May consider referral to IOP if the patient is interested. Will continue to coach behavioral activation.

## 2019-05-05 NOTE — BH Specialist Note (Signed)
Integrated Behavioral Health Treatment Planning Team  MRN: [id number] NAME: Jenny [name]  DATE: 05/05/19  Start time: 1:00 End time: 1:20 Total time: 20 minutes Total number of Virtual [place] Treatment Team Plan encounters:   Treatment Team Attendees: [name], LCSW and Dr. Bernerd Pho   Diagnoses:    ICD-10-CM   1. Depression, unspecified depression type F32.9     Goals, Interventions and Follow-up Plan Goals: Patient will: Increase motivation to adhere to plan of care Improve medication compliance and reduce symptoms of: anxiety depression stress and also Increase knowledge and/or ability of:  coping skills healthy habits self-management skills stress reduction Interventions: Motivational Interviewing Solution-Focused Strategies Supportive Counseling  Standardized Assessments Completed: none  Medication Management Recommendations: Per Dr. Modesta Messing No change in medication recommendation at this time. Will check if she experiences any side effect from uptitration of bupropion. May consider referral to IOP if the patient is interested. Will continue to coach behavioral activation  Follow up Plan: review any side effects of medication, evaluate for sleep difficulties with taking medication in the evening and see if patient is having difficulty consistently taking medication 2 times a day. Obtain new PHQ-9 and GAD.  Referral(s): per Dr. Franklyn Lor discuss IOP and virtual group with patient.  History of the present illness Presenting Problem/Current Symptoms: still feels low; often wanting to stay in bed; decrease in appetite but increase in eating junk food; still feeling frustrated with self;decrease thoughts of would be better off not here. No thoughts or plans to harm self. Difficult sleeping at times    Screenings PHQ-9 Assessments:  Depression screen [facility] 2/9 04/01/2019 03/18/2019 02/24/2019  Decreased Interest 1 1 1   Down, Depressed, Hopeless 2 1 2   PHQ - 2 Score 3 2 3   Altered  sleeping 1 3 2   Tired, decreased energy 2 3 2   Change in appetite 3 2 1   Feeling bad or failure about yourself  1 2 1   Trouble concentrating 1 1 1   Moving slowly or fidgety/restless 2 3 2   Suicidal thoughts 1 1 1   PHQ-9 Score 14 17 13   Difficult doing work/chores Somewhat difficult Somewhat difficult Somewhat difficult  Some recent data might be hidden   GAD-7 Assessments:  GAD 7 : Generalized Anxiety Score 02/24/2019 02/11/2019 01/27/2019 01/13/2019  Nervous, Anxious, on Edge 1 3 2 3   Control/stop worrying 1 1 1 2   Worry too much - different things 1 1 1 1   Trouble relaxing 2 3 2 2   Restless 2 2 2 2   Easily annoyed or irritable 2 2 2 2   Afraid - awful might happen 1 0 1 1  Total GAD 7 Score 10 12 11 13   Anxiety Difficulty Somewhat difficult Very difficult Somewhat difficult Somewhat difficult    Social History:  Social History   Socioeconomic History  . Marital status: Married    Spouse name: Not on file  . Number of children: Not on file  . Years of education: Not on file  . Highest education level: Not on file  Occupational History  . Not on file  Social Needs  . Financial resource strain: Not on file  . Food insecurity:    Worry: Never true    Inability: Never true  . Transportation needs:    Medical: No    Non-medical: No  Tobacco Use  . Smoking status: Never Smoker  . Smokeless tobacco: Never Used  Substance and Sexual Activity  . Alcohol use: No  . Drug use: No  . Sexual  activity: Not on file  Lifestyle  . Physical activity:    Days per week: Not on file    Minutes per session: Not on file  . Stress: To some extent  Relationships  . Social connections:    Talks on phone: Not on file    Gets together: Not on file    Attends religious service: Not on file    Active member of club or organization: Not on file    Attends meetings of clubs or organizations: Not on file    Relationship status: Not on file  Other Topics Concern  . Not on file  Social History  Narrative  . Not on file    Past Medical History Medical History:  Past Medical History:  Diagnosis Date  . Anemia   . Cancer [facility])    colon cancer   . Enlarged thyroid    Allergies:  Allergies as of 05/05/2019 - Review Complete 02/10/2019  Allergen Reaction Noted  . Other Rash 12/22/2014  . Shellfish allergy Rash 12/05/2014   Labs:  Recent Results (from the past 2160 hour(s))  Iron and TIBC     Status: Abnormal   Collection Time: 02/08/19  4:05 PM  Result Value Ref Range   Iron 44 41 - 142 ug/dL   TIBC 288 236 - 444 ug/dL   Saturation Ratios 15 (L) 21 - 57 %   UIBC 244 120 - 384 ug/dL    Comment: Performed at [facility], [address], [place], [place] [address]  Ferritin     Status: None   Collection Time: 02/08/19  4:05 PM  Result Value Ref Range   Ferritin 91 11 - 307 ng/mL    Comment: Performed at [facility], [place] [address] [place], [place] [address]  CEA (IN HOUSE-[facility])     Status: None   Collection Time: 02/08/19  4:05 PM  Result Value Ref Range   CEA ([facility]-In House) 1.15 0.00 - 5.00 ng/mL    Comment: (NOTE) This test was performed using Architect's Chemiluminescent Microparticle Immunoassay. Values obtained from different assay methods cannot be used interchangeably. Please note that 5-10% of patients who smoke may see CEA levels up to 6.9 ng/mL. Performed at [facility], [place] [address] [place], [place] [address]   Comprehensive metabolic panel     Status: Abnormal   Collection Time: 02/08/19  4:05 PM  Result Value Ref Range   Sodium 140 135 - 145 mmol/L   Potassium 3.9 3.5 - 5.1 mmol/L   Chloride 104 98 - 111 mmol/L   CO2 25 22 - 32 mmol/L   Glucose, Bld 164 (H) 70 - 99 mg/dL   BUN 14 6 - 20 mg/dL   Creatinine, Ser 0.85 0.44 - 1.00 mg/dL   Calcium 10.0 8.9 - 10.3 mg/dL   Total Protein 7.7 6.5 - 8.1 g/dL   Albumin 4.1 3.5 - 5.0 g/dL   AST 12 (L) 15 - 41 U/L   ALT 15 0  - 44 U/L   Alkaline Phosphatase 73 38 - 126 U/L   Total Bilirubin 0.3 0.3 - 1.2 mg/dL   GFR calc non Af Amer >60 >60 mL/min   GFR calc Af Amer >60 >60 mL/min   Anion gap 11 5 - 15    Comment: Performed at [facility], [place] [address], [place], [place] [address]  CBC with Differential     Status: Abnormal   Collection  Time: 02/08/19  4:05 PM  Result Value Ref Range   WBC 6.6 4.0 - 10.5 K/uL   RBC 4.49 3.87 - 5.11 MIL/uL   Hemoglobin 11.6 (L) 12.0 - 15.0 g/dL   HCT 36.9 36.0 - 46.0 %   MCV 82.2 80.0 - 100.0 fL   MCH 25.8 (L) 26.0 - 34.0 pg   MCHC 31.4 30.0 - 36.0 g/dL   RDW 16.5 (H) 11.5 - 15.5 %   Platelets 306 150 - 400 K/uL   nRBC 0.0 0.0 - 0.2 %   Neutrophils Relative % 57 %   Neutro Abs 3.8 1.7 - 7.7 K/uL   Lymphocytes Relative 37 %   Lymphs Abs 2.4 0.7 - 4.0 K/uL   Monocytes Relative 4 %   Monocytes Absolute 0.3 0.1 - 1.0 K/uL   Eosinophils Relative 2 %   Eosinophils Absolute 0.1 0.0 - 0.5 K/uL   Basophils Relative 0 %   Basophils Absolute 0.0 0.0 - 0.1 K/uL   Immature Granulocytes 0 %   Abs Immature Granulocytes 0.02 0.00 - 0.07 K/uL    Comment: Performed at [facility], [address], [place], [place] [address]  POCT glycosylated hemoglobin (Hb A1C)     Status: Abnormal   Collection Time: 03/24/19 11:02 AM  Result Value Ref Range   Hemoglobin A1C     HbA1c POC (<> result, manual entry)     HbA1c, POC (prediabetic range)     HbA1c, POC (controlled diabetic range) 7.5 (A) 0.0 - 7.0 %    Medication History: Current medications:  Outpatient Encounter Medications as of 05/05/2019  Medication Sig  . acetaminophen (TYLENOL) 500 MG tablet Take 500 mg by mouth every 6 (six) hours as needed.  Marland Kitchen buPROPion (WELLBUTRIN XL) 150 MG 24 hr tablet Take 1 tablet (150 mg total) by mouth 2 (two) times daily.  . fluticasone (FLONASE) 50 MCG/ACT nasal spray Place 2 sprays into both nostrils daily.  Marland Kitchen gabapentin (NEURONTIN) 300 MG  capsule Take 2 capsules (600 mg total) by mouth 3 (three) times daily.  . hydrocortisone cream 0.5 % Apply 1 application topically 2 (two) times daily as needed for itching.  . loratadine (CLARITIN) 10 MG tablet Take 10 mg by mouth daily. AS NEEDED  . metFORMIN (GLUMETZA) 1000 MG (MOD) 24 hr tablet Take 1 tablet (1,000 mg total) by mouth 2 (two) times daily with a meal.  . Multiple Vitamin (MULTIVITAMIN) tablet Take 1 tablet by mouth daily.  . sertraline (ZOLOFT) 100 MG tablet Take 1.5 tablets (150 mg total) by mouth daily.   No facility-administered encounter medications on file as of 05/05/2019.     Scribe for Treatment Team: Maurine Cane, LCSW

## 2019-05-06 ENCOUNTER — Ambulatory Visit: Payer: BC Managed Care – PPO

## 2019-05-06 ENCOUNTER — Telehealth: Payer: Self-pay | Admitting: Licensed Clinical Social Worker

## 2019-05-06 NOTE — Telephone Encounter (Signed)
   Integrated Care Telephone Outreach Note  Scheduled F/U IBH call to patient today at 3:00.   Patient is driving and unable to have phone appointment.    Phone appointment rescheduled for 05/11/19  [name], [place]   [phone] 3:28 PM

## 2019-05-11 ENCOUNTER — Ambulatory Visit: Payer: BC Managed Care – PPO | Admitting: Licensed Clinical Social Worker

## 2019-05-11 DIAGNOSIS — F32A Depression, unspecified: Secondary | ICD-10-CM

## 2019-05-11 DIAGNOSIS — F329 Major depressive disorder, single episode, unspecified: Secondary | ICD-10-CM

## 2019-05-11 NOTE — BH Specialist Note (Addendum)
Integrated Behavioral Health Follow Up Visit  MRN: [id number] Name: Jenny [name] [facility]  Session Start time: 2:30  Session End time: 3:15 Total time: 45 minutes  Referring Provider: Dr. Erin Hearing  Type of Visit: Telephonic Patient location: home LCSW  location: home All persons participating in visit: patient and LCSW Confirmed patient's demographics: Yes  Confirmed patient's insurance: (not billing for service) No  Discussed confidentiality: Yes   "The purpose of this phone visit is to provide behavioral health care while limiting exposure to the coronavirus (COVID19). " "By engaging in this telephone visit, you consent to the provision of healthcare."  Patient consented to telephone visit: Yes  Reason for follow-up: Continue brief intervention to assist patient with managing symptoms of anxiety and depression, as well as managing stressors .   Report of symptoms: trouble falling a sleep ; still fighting low energy; easily frustrated; fidgety and shaky at times; able to focus better; taking meds daily; walked 2 times last week; working on self-care; is doing relaxed breathing several times a day, Reports somewhat difficulty in daily functions.    Reports overall she believes she is starting to get better.  ASSESSMENT: Mood: Euthymic and Affect: Appropriate;Thought process: Coherent;  No plan to harm self or others. Thoughts at times would be better if she were not here reports having no plan or intension to act on this.  Faith and family are her support.  Patient is making progress towards goal.  States overall she believes she is starting to feel better. This is also evident by no increase PHQ-9/GAD score below. She has been able to listen on zoom call for spiritual support.  Patient continues to experience symptoms of depression and difficult with falling a sleep. Patient may benefit from, and is in agreement to continue ongoing brief therapeutic interventions to assist with managing her  symptoms.   Intervention: Client interviewed and appropriate assessments performed. Discussed IOP and online group.  Patient does not enjoy classes on the computer and wants to hold off on this for now. Other interventions include:Reflective listening, behavioral activation; review of self-care action plan as well as previous and current coping skills. Patient is taking Wellbutrin at 10:30 AM and 8:00 PM. will consult with PCP to see if patient should continue taking medication at this times.   Depression screen [facility] 2/9 05/11/2019 04/01/2019 03/18/2019  Decreased Interest 1 1 1   Down, Depressed, Hopeless 2 2 1   PHQ - 2 Score 3 3 2   Altered sleeping 1 1 3   Tired, decreased energy 2 2 3   Change in appetite 1 3 2   Feeling bad or failure about yourself  2 1 2   Trouble concentrating 1 1 1   Moving slowly or fidgety/restless 2 2 3   Suicidal thoughts 1 1 1   PHQ-9 Score 13 14 17   Difficult doing work/chores Somewhat difficult Somewhat difficult Somewhat difficult  Some recent data might be hidden   GAD 7 : Generalized Anxiety Score 05/11/2019 02/24/2019 02/11/2019 01/27/2019  Nervous, Anxious, on Edge 2 1 3 2   Control/stop worrying 1 1 1 1   Worry too much - different things 1 1 1 1   Trouble relaxing 2 2 3 2   Restless 2 2 2 2   Easily annoyed or irritable 2 2 2 2   Afraid - awful might happen 1 1 0 1  Total GAD 7 Score 11 10 12 11   Anxiety Difficulty Somewhat difficult Somewhat difficult Very difficult Somewhat difficult   GOALS:Patient will: 1. Reduce symptoms of: agitation, depression and stress  2. Increase knowledge and/or ability of: coping skills, self-management skills and stress reduction  3.   Increase Healthy eating habits and exercise PLAN:  1.Patient will F/U with LCSW in 2 weeks 2.Behavioral recommendations: continue relaxed breathing; self-care action plan, walking 2 times weekly; Lind Covert, MD and  Dr. Modesta Messing has been notified of this outreach and Ms. Lucine Bilski [facility] plan.    [name], [facility]   [phone] 3:54 PM

## 2019-05-12 NOTE — Progress Notes (Signed)
[facility] Health Initiative ([place]) Psychiatric Consultant Case Review   Jenny [name] is a 58 y.o. year old female with a history of depression,  diabetes, sigmoid colon cancer, stage IIIB adenocarcinoma, s/p chemotherapy, partial resection, peripheral neuropathy after chemotherapy. There has been gradual improvement in depression since uptitration of bupropion. She complains of initial insomnia.   According to the chart review, the patient was seen by pulmonology for hypersomnolence in 07/2018. Home sleep study was negative for sleep disordered breathing. The provider was considering further evaluation for possible narcolepsy  (she was not seen by this provider since then).   Assessment/Provisional Diagnosis # MDD, moderate Will continue sertraline for depression and bupropion as adjunctive treatment for depression. Noted that bupropion XL can be taken once daily to avoid risk of insomnia unless patient experiences side effect from this dose.   Recommendation - Continue sertraline 150 mg daily  - Consider switching to bupropion XL 300 mg daily (or two tabs of 150 mg daily) - [place] specialist to continue to coach behavioral activation, sleep hygiene  Thank you for your consult. We will continue to follow the patient. Please contact [place]  for any questions or concerns.   The above treatment considerations and suggestions are based on consultation with the [facility] specialist and/or PCP and a review of information available in the shared registry and the patient's [place] Record ([name]). I have not personally examined the patient. All recommendations should be implemented with consideration of the patient's relevant prior history and current clinical status. Please feel free to call me with any questions about the care of this patient.

## 2019-05-20 ENCOUNTER — Telehealth: Payer: Self-pay | Admitting: Family Medicine

## 2019-05-20 NOTE — Telephone Encounter (Signed)
Left message for her to call back to discuss medication changes

## 2019-05-20 NOTE — Telephone Encounter (Signed)
-----   Message from Maurine Cane, LCSW sent at 05/19/2019  8:33 AM EDT ----- I will be in the office tomorrow  May 28th.  And look forward to talking with you  She is taking 150mg  2 times a day.  She is talking one tab around 10:30am and the other around 8 or 9 at night.   I think Dr. Modesta Messing suggestion was for her to take them both in the AM or take one 300 tab 1 time a day.   Neoma Laming  ----- Message ----- From: Lind Covert, MD Sent: 05/14/2019   1:57 PM EDT To: Maurine Cane, LCSW  Ballard Russell a bit confused.  I think she is taking wellbutrin 150 mg XL twice a day   Perhaps I'm missing something.  When will you be in the office next.  We probably ought to discuss our longer term plans about her  Thanks  Truman Hayward  ----- Message ----- From: Maurine Cane, LCSW Sent: 05/12/2019   1:48 PM EDT To: Lind Covert, MD  See note from Dr. Modesta Messing on med. Recommendation if you agree. Thanks  [employer]

## 2019-05-21 ENCOUNTER — Telehealth: Payer: Self-pay | Admitting: Family Medicine

## 2019-05-21 NOTE — Telephone Encounter (Signed)
Got vm again Left message for her to call with times I can speak with her

## 2019-05-21 NOTE — Telephone Encounter (Signed)
Patient calls nurse line returning [place] phone call. Pt stated she is taking Wellbutrin 1 tab AM and 1 tab PM. Informed her, per note, she was to be taking both 150mg s tab in am, per Dr. Modesta Messing. Pt stated she was not aware of this. Will forward to MD.

## 2019-05-21 NOTE — Telephone Encounter (Signed)
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  Verified that patient section of form has been completed.  Last DOS/WCC with PCP was 04/16/2019.  Placed form in team folder to be completed by clinical staff.  Jenny [name]

## 2019-05-24 NOTE — Telephone Encounter (Signed)
Reviewed disability form and placed it in PCP's box for completion.  Ozella Almond, [place]

## 2019-05-24 NOTE — Telephone Encounter (Signed)
Will see tomorrow

## 2019-05-24 NOTE — Telephone Encounter (Signed)
Called again Left vm to call us

## 2019-05-25 ENCOUNTER — Ambulatory Visit: Payer: BC Managed Care – PPO

## 2019-05-25 ENCOUNTER — Telehealth: Payer: Self-pay | Admitting: Licensed Clinical Social Worker

## 2019-05-25 ENCOUNTER — Encounter: Payer: Self-pay | Admitting: Family Medicine

## 2019-05-25 ENCOUNTER — Other Ambulatory Visit: Payer: Self-pay

## 2019-05-25 ENCOUNTER — Ambulatory Visit (INDEPENDENT_AMBULATORY_CARE_PROVIDER_SITE_OTHER): Payer: BC Managed Care – PPO | Admitting: Family Medicine

## 2019-05-25 DIAGNOSIS — F4323 Adjustment disorder with mixed anxiety and depressed mood: Secondary | ICD-10-CM

## 2019-05-25 DIAGNOSIS — M542 Cervicalgia: Secondary | ICD-10-CM | POA: Diagnosis not present

## 2019-05-25 NOTE — Telephone Encounter (Signed)
   Phone Outreach Note  05/25/2019 Name: DEANE MELICK MRN: [id number] DOB: 23-Oct-1961  Referred by: Lind Covert, MD Reason for referral : Depression (phone appointment)  An unsuccessful telephone outreach to patient was attempted today a phone appointment.  Left voice message to call LCSW if she would like to reschedule the appointment.   Follow Up Plan: LCSW will wait for return call.   [name], LCSW [facility]   [phone] 2:44 PM

## 2019-05-25 NOTE — Assessment & Plan Note (Signed)
Hard to assess given her pain today.  Does not seem to be significantly better.  Will change to Wellbutrin XL 300 mg tabs in AM and prescribe at [facility] as she can get them cheaper there.

## 2019-05-25 NOTE — Progress Notes (Signed)
Subjective  Jenny [name] is a 58 y.o. female is presenting with the following  LEFT SHOULDER NECK PAIN Has had this on and off for months.  Wonders is maybe related to thyroid bx.   Has been worsening over the last weeks.  No specific trauma.  Is worse with neck or arm movement or stress or carrying things.  Pain will sometimes shoot down to mid chest and arm when sitting.  No specific arm weakness but does feel numb sometimes.  Using gabapentin which helps some No shortness of breath or near syncope.  Chief Complaint noted Review of Symptoms - see HPI PMH - Smoking status noted.    Objective Vital Signs reviewed BP 134/84   Pulse 82   Wt 246 lb (111.6 kg)   SpO2 97%   BMI 38.53 kg/m  Mild distress Heart - Regular rate and rhythm.  No murmurs, gallops or rubs.    Lungs:  Normal respiratory effort, chest expands symmetrically. Lungs are clear to auscultation, no crackles or wheezes. Is having pain when sitting.  Worsens with rotation of her neck and midly with range of motion of her shoulder ROM is limited with her neck and preserved in L shoulder  Distal strength and pulses intact Neck - diffuse thyroid fullness R>L no redness or tenderness to palpation  Assessments/Plans  See after visit summary for details of patient instuctions  Neck pain on right side Seems most consistent with musculoskeletal or nerve impingement given worsening with movement.  No signs of cardiac ischemia but need to consider if not improving or changes.  Given history of cancer will check xrays.  Treat with analgesics and gabapentin.   See after visit summary for details   Adjustment disorder with mixed anxiety and depressed mood Hard to assess given her pain today.  Does not seem to be significantly better.  Will change to Wellbutrin XL 300 mg tabs in AM and prescribe at [facility] as she can get them cheaper there.

## 2019-05-25 NOTE — Patient Instructions (Addendum)
Good to see you today!  Thanks for coming in.  For the pain  Lidocaine patch - over the counter use per instructions   Tylenol 2 tabs three times a day  Gabapentin regularly - 2 caps three times a day  Warm compress might help  I will order an xray and let you know the results  Take both 150 mg buproprion tabs together in the morning. Call me with the nearest [facility] for the new prescription of 300 mg tabs   Let me know how you are doing in a few days  Come back in 2-4 weeks

## 2019-05-25 NOTE — Assessment & Plan Note (Signed)
Seems most consistent with musculoskeletal or nerve impingement given worsening with movement.  No signs of cardiac ischemia but need to consider if not improving or changes.  Given history of cancer will check xrays.  Treat with analgesics and gabapentin.   See after visit summary for details

## 2019-05-26 NOTE — Telephone Encounter (Signed)
Care Coordination  Telephone Outreach Note 05/26/2019 Name: Jenny [name] MRN: [id number] DOB: Oct 20, 1961  LCSW returned call from patient.  Patient would like to reschedule phone appointment with LCSW.  Appointment scheduled June 9th at 9:30.  [name], [place]   [phone] 4:17 PM

## 2019-06-01 ENCOUNTER — Ambulatory Visit: Payer: BC Managed Care – PPO | Admitting: Licensed Clinical Social Worker

## 2019-06-01 DIAGNOSIS — F4323 Adjustment disorder with mixed anxiety and depressed mood: Secondary | ICD-10-CM

## 2019-06-01 NOTE — Telephone Encounter (Signed)
Patient contacted and informed of form ready for pick up.  

## 2019-06-01 NOTE — BH Specialist Note (Signed)
Integrated Care Telephone Outreach Note  Start time: 9:30  End time: 10:25 Total time: 55 minutes  Referring Provider: Chambliss  Type of Visit: Telephonic Patient location: home LCSW  location: home All persons participating in visit: patient and LCSW  Confirmed patient's address: Yes  Discussed confidentiality: Yes   "The purpose of this phone visit is to provide behavioral health care while limiting exposure to the coronavirus (COVID19). " "By engaging in this telephone visit, you consent to the provision of healthcare."  Patient consented to telephone visit: Yes   Reason for follow-up: Continue brief intervention to assist patient with managing symptoms of anxiety and depression, as well as managing stressors .  Report of symptoms: improvement with thoughts of not wanting to be here, decreased in shacking, improvement in food and other choices, being less irritable, improved hygiene, improvement in energy level ; overall somewhat difficulty in daily functions and patient is pleased with how she is feeling this week. Reports taking medication daily.  ASSESSMENT: Mood: Euthymic and Affect: Appropriate;Thought process: Coherent;  No plan to harm self or others.  Patient is making progress towards goal.  States she is starting to feel better. This is also evident by the decrease in PHQ-9/GAD scores below. Patient continues to experience symptoms of anxiety and depression but has made improvements over the past week since she started taking 300 wellbutrin one time in the AM along with the sertraline. Patient is also doing relaxed breathing more.  She may benefit from, and is in agreement to continue ongoing assessment and brief therapeutic interventions to assist with managing her symptoms.  STRENGTHS:  GOALS:Patient will: 1. Reduce symptoms of: anxiety, depression and stress 2. Increase knowledge and/or ability of: coping skills, healthy habits, self-management skills and stress  reduction  3.   Walk and increase positive intake PLAN:  1.Patient will F/U with LCSW in 3 weeks 2.Behavioral recommendations: continue relaxed breathing, Walk 2 days a week for 30 min. Self-Care action plan,  3. Listen to positive music and increase positive input   Intervention: Client interviewed and appropriate assessments performed. interventions include:,Motivational Interviewing, Solution-Focused Strategies and Behavioral Activation, Reflective listening, Behavioral Therapy (Relaxed breathing); Screening Tool(s)  Administered, Problem-solving teaching/coping strategies and Psychoeducation   GAD 7 : Generalized Anxiety Score 06/01/2019 05/11/2019 02/24/2019 02/11/2019  Nervous, Anxious, on Edge 1 2 1 3   Control/stop worrying 1 1 1 1   Worry too much - different things 1 1 1 1   Trouble relaxing 2 2 2 3   Restless 1 2 2 2   Easily annoyed or irritable 2 2 2 2   Afraid - awful might happen 1 1 1  0  Total GAD 7 Score 9 11 10 12   Anxiety Difficulty Somewhat difficult Somewhat difficult Somewhat difficult Very difficult    Depression screen [facility] 2/9 06/01/2019 05/25/2019 05/11/2019  Decreased Interest 1 0 1  Down, Depressed, Hopeless 2 0 2  PHQ - 2 Score 3 0 3  Altered sleeping 1 - 1  Tired, decreased energy 1 - 2  Change in appetite 1 - 1  Feeling bad or failure about yourself  2 - 2  Trouble concentrating 1 - 1  Moving slowly or fidgety/restless 1 - 2  Suicidal thoughts 1 - 1  PHQ-9 Score 11 - 13  Difficult doing work/chores Somewhat difficult - Somewhat difficult  Some recent data might be hidden   Dr. Erin Hearing and Dr. Bernerd Pho have been notified of this outreach and plan.  [name], LCSW [facility]  [phone] 11:10 AM

## 2019-06-14 ENCOUNTER — Other Ambulatory Visit: Payer: Self-pay | Admitting: Family Medicine

## 2019-06-14 DIAGNOSIS — F411 Generalized anxiety disorder: Secondary | ICD-10-CM

## 2019-06-15 ENCOUNTER — Ambulatory Visit: Payer: BC Managed Care – PPO | Admitting: Family Medicine

## 2019-06-15 ENCOUNTER — Telehealth: Payer: Self-pay | Admitting: *Deleted

## 2019-06-15 NOTE — Telephone Encounter (Signed)
Received fax that patient cant afford Metformin ER 1000 mg.  Please send metformin 500mg  BID if appropriate. Christen Bame, CMA

## 2019-06-16 MED ORDER — METFORMIN HCL 1000 MG PO TABS: 1000.0000 mg | ORAL_TABLET | Freq: Two times a day (BID) | ORAL | 1 refills | Status: DC

## 2019-06-18 ENCOUNTER — Telehealth: Payer: Self-pay | Admitting: Hematology

## 2019-06-18 NOTE — Telephone Encounter (Signed)
YF PAL moved lab/fu moved from 7/6 to 7/24. Left message. Schedule mailed.

## 2019-06-22 ENCOUNTER — Ambulatory Visit (INDEPENDENT_AMBULATORY_CARE_PROVIDER_SITE_OTHER): Payer: BC Managed Care – PPO | Admitting: Family Medicine

## 2019-06-22 ENCOUNTER — Ambulatory Visit: Payer: BC Managed Care – PPO | Admitting: Licensed Clinical Social Worker

## 2019-06-22 ENCOUNTER — Other Ambulatory Visit: Payer: Self-pay

## 2019-06-22 VITALS — BP 118/74 | HR 73 | Wt 244.4 lb

## 2019-06-22 DIAGNOSIS — R197 Diarrhea, unspecified: Secondary | ICD-10-CM | POA: Diagnosis not present

## 2019-06-22 DIAGNOSIS — Z6841 Body Mass Index (BMI) 40.0 and over, adult: Secondary | ICD-10-CM

## 2019-06-22 DIAGNOSIS — F4323 Adjustment disorder with mixed anxiety and depressed mood: Secondary | ICD-10-CM

## 2019-06-22 DIAGNOSIS — E1165 Type 2 diabetes mellitus with hyperglycemia: Secondary | ICD-10-CM

## 2019-06-22 DIAGNOSIS — M542 Cervicalgia: Secondary | ICD-10-CM | POA: Diagnosis not present

## 2019-06-22 LAB — POCT GLYCOSYLATED HEMOGLOBIN (HGB A1C): Hemoglobin A1C: 7.5 % — AB (ref 4.0–5.6)

## 2019-06-22 NOTE — Patient Instructions (Signed)
Good to see you today!  Thanks for coming in.  Take metamucil 1 tbsp every day to help with the loose bowels  Take the gabapentin 2-3 capsules only at night and not take during the day  Come back for a visit in 3 months for a diabetes check  Call if any changes

## 2019-06-22 NOTE — Assessment & Plan Note (Signed)
Improving.  Lost 16 lbs in last year.

## 2019-06-22 NOTE — Assessment & Plan Note (Signed)
Very likely related to metformin.  Will try metamucil to see if makes more formed and easier to control

## 2019-06-22 NOTE — Assessment & Plan Note (Signed)
Improved

## 2019-06-22 NOTE — BH Specialist Note (Signed)
Integrated Behavioral Health Follow Up Visit  MRN: [id number] Name: Jenny [name] [facility]  Session Start time: 10:30  Session End time: 11:05 Total time: 35 minutes Type of Visit: Telephonic Patient location: home LCSW  location: home persons other than patient participating in visit: none Confirmed patient's date of birth: Yes  Any changes to demographics: Yes  Confirmed patient's insurance: (not billing for service) No  Discussed confidentiality: Yes   "The purpose of this phone visit is to provide behavioral health care while limiting exposure to the coronavirus (COVID19). " "By engaging in this telephone visit, you consent to the provision of healthcare."  Patient consented to telephone visit: Yes   Reason for follow-up: Continue brief intervention to assist patient with managing symptoms of anxiety and depression, as well as managing stressors .   Report of symptoms: fearful of the unknown, still feeling shaky, difficult with sleep at times, continues to take Wellbutrin 300mg  in AM and Sertraline in the AM. Feels frustrated when she has to repeat things to her grandchildren. States she has more energy in the morning now. ; still somewhat difficulty in daily functions.   Patient notices improvement in personal hygiene, exercising 2 to 3 times per week, utilizing relaxed breathing when feeling overwhelmed. Feel like crying at times but doing better with controlling her emotions. New stressor of moving to a new house in the past 3 weeks.      ASSESSMENT: Mood: Euthymic and Thought process: Coherent;  No plan to harm self or others.  Patient is making progress towards goal.  States she is starting to feel better. She also noticed that she has been able to maintain even with adding the stress of moving.  This is also evident by PHQ-9/GAD scores below. Patient continues to experience symptoms of  Anxiety however she is doing much better with managing the day to day duties at home. Patient may  benefit from, and is in agreement to continue ongoing assessment and brief therapeutic interventions to assist with managing her symptoms.  GOALS:Patient will: 1. Reduce symptoms of: anxiety, depression and stress 2. Increase knowledge and/or ability of: coping skills, healthy habits, self-management skills and stress reduction  3.   Increase and maintain exercise PLAN:  1.F/U with LCSW in 2 weeks 2.Behavioral recommendations: continue relaxed breathing, behavioral activation  3.Referral: none at this time  Intervention: Client interviewed and appropriate assessments performed.  Other interventions include:,Solution-Focused Strategies, Reflective listening, Behavioral Therapy (Relaxed breathing); Problem-solving teaching/coping strategies   GAD-7 and PHQ 9   GAD 7 : Generalized Anxiety Score 06/22/2019 06/01/2019 05/11/2019 02/24/2019  Nervous, Anxious, on Edge 2 1 2 1   Control/stop worrying 1 1 1 1   Worry too much - different things 1 1 1 1   Trouble relaxing 2 2 2 2   Restless 2 1 2 2   Easily annoyed or irritable 1 2 2 2   Afraid - awful might happen 1 1 1 1   Total GAD 7 Score 10 9 11 10   Anxiety Difficulty Somewhat difficult Somewhat difficult Somewhat difficult Somewhat difficult   Depression screen [facility] 2/9 06/22/2019 06/01/2019 05/25/2019  Decreased Interest 1 1 0  Down, Depressed, Hopeless 1 2 0  PHQ - 2 Score 2 3 0  Altered sleeping 1 1 -  Tired, decreased energy 2 1 -  Change in appetite 1 1 -  Feeling bad or failure about yourself  2 2 -  Trouble concentrating 1 1 -  Moving slowly or fidgety/restless 1 1 -  Suicidal thoughts 1 1 -  PHQ-9 Score 11 11 -  Difficult doing work/chores Somewhat difficult Somewhat difficult -  Some recent data might be hidden   Dr Erin Hearing and Dr. Bernerd Pho have been notified of this intervention and plan.  [name], LCSW [facility]   [phone] 11:19 AM

## 2019-06-22 NOTE — Assessment & Plan Note (Signed)
Stable control with A1c 7.5 nearly at goal.  Will not add additional medications and encourage continued weight loss

## 2019-06-22 NOTE — Progress Notes (Signed)
Subjective  Jenny [name] is a 58 y.o. female is presenting with the following  ADJUSTMENT DISORDER Recent big stressor having to move so her husband does not have to climb steps.  She feels she actually handled this well but her scores did not improve due to the extra stress.  Reports is taking her medications regularly   DIABETES Disease Monitoring: Blood Sugar ranges(Severity) -not checking  Associated Symptoms- Polyuria/phagia/dipsia- no      Visual problems- no Medications: Compliance(Modifying factor) - metformin bid Hypoglycemic symptoms- no Timing - continuous  NECK Pain Improved.  Using heat and range of motion exercises.  No weakness   Monitoring Labs and Parameters Last A1C:  Lab Results  Component Value Date   HGBA1C 7.5 (A) 06/22/2019   Last Lipid:     Component Value Date/Time   CHOL 150 12/25/2016 1157   HDL 41 (L) 12/25/2016 1157   Last Bmet  Potassium  Date Value Ref Range Status  02/08/2019 3.9 3.5 - 5.1 mmol/L Final  06/18/2017 4.1 3.5 - 5.1 mEq/L Final   Sodium  Date Value Ref Range Status  02/08/2019 140 135 - 145 mmol/L Final  02/25/2018 144 134 - 144 mmol/L Final  06/18/2017 142 136 - 145 mEq/L Final   Creatinine  Date Value Ref Range Status  06/18/2017 0.8 0.6 - 1.1 mg/dL Final   Creatinine, Ser  Date Value Ref Range Status  02/08/2019 0.85 0.44 - 1.00 mg/dL Final      Chief Complaint noted Review of Symptoms - see HPI PMH - Smoking status noted.    Objective Vital Signs reviewed BP 118/74   Pulse 73   Wt 244 lb 6.4 oz (110.9 kg)   SpO2 96%   BMI 38.28 kg/m  Extremities:  No cyanosis, edema, or deformity noted with good range of motion of all major joints.   Normal bilateral foot exam   Assessments/Plans   See after visit summary for details of patient instuctions  Obesity Improving.  Lost 16 lbs in last year.    Poorly controlled diabetes mellitus ([place]) Stable control with A1c 7.5 nearly at goal.  Will not add  additional medications and encourage continued weight loss   Neck pain on right side Improved.    Diarrhea Very likely related to metformin.  Will try metamucil to see if makes more formed and easier to control

## 2019-06-28 ENCOUNTER — Other Ambulatory Visit: Payer: BC Managed Care – PPO

## 2019-06-28 ENCOUNTER — Ambulatory Visit: Payer: BC Managed Care – PPO | Admitting: Hematology

## 2019-06-30 ENCOUNTER — Telehealth: Payer: Self-pay | Admitting: Family Medicine

## 2019-06-30 NOTE — Telephone Encounter (Signed)
Placed in MDs box to be filled out. Tabbatha Bordelon, CMA  

## 2019-06-30 NOTE — Telephone Encounter (Signed)
Disability form dropped off at front desk for completion.  Verified that patient section of form has been completed.  Last DOS/WCC with PCP was 06/22/2019.  Placed form in team folder to be completed by clinical staff.  Jenny [name]

## 2019-07-01 NOTE — Telephone Encounter (Signed)
LMOVM that forms were ready for pick up.  Copy placed in batch scanning, original placed at the front for pick up. Christen Bame, CMA

## 2019-07-06 ENCOUNTER — Other Ambulatory Visit: Payer: Self-pay

## 2019-07-06 ENCOUNTER — Ambulatory Visit: Payer: BC Managed Care – PPO

## 2019-07-09 NOTE — Progress Notes (Signed)
[place]   Telephone:([phone] Fax:([phone]) [phone]   Clinic Follow up Note   Patient Care Team: Lind Covert, MD as PCP - General Juanita Craver, MD as Consulting Physician (Gastroenterology) [name], MD as Consulting Physician (General Surgery) Truitt Merle, MD as Consulting Physician (Hematology)  Date of Service:  07/16/2019  CHIEF COMPLAINT: F/u of sigmoid colon cancer   SUMMARY OF ONCOLOGIC HISTORY: Oncology History Overview Note  Cancer Staging Cancer of sigmoid colon metastatic to intra-abdominal lymph node [facility]) Staging form: Colon and Rectum, AJCC 7th Edition - Clinical: Stage IIIB (T3, N1a, M0) - Signed by Truitt Merle, MD on 12/28/2014 - Pathologic stage from 11/14/2014: Stage IIIB (T3, N1a, cM0) - Signed by Truitt Merle, MD on 06/29/2018     Cancer of sigmoid colon metastatic to intra-abdominal lymph node ([place])  10/10/2014 Imaging   CT abdomen and pelvis: Eccentric wall thickening along the sigmoid colon, worrisome for early colonic adenocarcinoma. No findings suspicious for metastatic disease. CT chest (-)      11/14/2014 Initial Diagnosis   Colon cancer   11/14/2014 Pathologic Stage   pT3pN1pMx, G2, tumor invading through the muscular propria into pericolonic fatty tissue.  LVI (-), PNI (-). 1/28 node positive, margins negative.    11/14/2014 Surgery   sigmoid colon segmental resection, margins (-).    11/14/2014 Cancer Staging   Staging form: Colon and Rectum, AJCC 7th Edition - Pathologic stage from 11/14/2014: Stage IIIB (T3, N1a, cM0) - Signed by Truitt Merle, MD on 06/29/2018   12/28/2014 - 05/17/2015 Adjuvant Chemotherapy   mFOLFOX6, 10% dose reduction due to neuropathy and cytopenia from cycle 5, oxaliplatin held from cycle 8 due to leg pain and neuropathy. chemo stopped after cycle 10 due to her tolerance issue.    12/29/2015 Imaging   CT chest, abdomen and pelvis with contrast showed no evidence of recurrence.   06/19/2017 Imaging   CT CAPw contrast IMPRESSION: No evidence recurrent or metastatic colon carcinoma.  Stable mild hepatic steatosis.  5.1 cm right thyroid lobe nodule measures larger in size compared to recent studies. Thyroid ultrasound recommended for further evaluation. This recommendation follows ACR consensus guidelines: Managing Incidental Thyroid Nodules Detected on Imaging: White Paper of the ACR Incidental Thyroid Findings Committee. J Am Coll Radiol 2015;12(2):143-150.  Aortic atherosclerosis.   07/10/2017 Pathology Results   Thyroid Biopsy 07/10/17 Diagnosis THYROID, FINE NEEDLE ASPIRATION, RIGHT INFERIOR, RLP (SPECIMEN 1 OF 2 COLLECTED 07-10-2017) CONSISTENT WITH BENIGN FOLLICULAR NODULE (BETHESDA CATEGORY II).    07/10/2017 Imaging   07/10/2017 [name] [facility] Thyroid IMPRESSION: 1. Technically successful ultrasound guided fine needle aspiration of right inferior thyroid nodule. 2. Technically successful ultrasound guided fine needle aspiration of left mid thyroid nodule.  07/10/2017 US Thyroid IMPRESSION: 6.3 cm right inferior TR 3 nodule meets criteria for biopsy. 1.8 cm left mid TR 4 nodule also meets criteria for biopsy.   06/22/2018 Imaging   06/22/2018 CT CAP W Contrast IMPRESSION: 1. No findings to suggest locally recurrent disease or metastatic disease in the chest, abdomen or pelvis. 2. Dilatation of the pulmonic trunk (3.7 cm in diameter), concerning for pulmonary arterial hypertension. 3. Persistent and increasing mass-like enlargement of the right lobe of the thyroid gland. Correlation with results of prior thyroid ultrasound and biopsy is recommended.      CURRENT THERAPY:  Surveillance   INTERVAL HISTORY:  Jenny [name] is here for a follow up sigmoid colon cancer. She was last seen by me 6 months ago. She presents to  the clinic alone. She notes significant neuropathy in her feet and hands. She takes Gabapentin '300mg'$  2-3 times a day. She is not very active but does  some of her home work. She rests as needed. She notes having abdominal pain with diarrhea which will cause stool incontinence often. She has had public accidents several time. This has been going on since last year. She did go to PT for pelvic floor exercise and tries to continue at home.  She notes LUQ pain and knots in her epigastric region when she bends over.    REVIEW OF SYSTEMS:   Constitutional: Denies fevers, chills or abnormal weight loss Eyes: Denies blurriness of vision Ears, nose, mouth, throat, and face: Denies mucositis or sore throat Respiratory: Denies cough, dyspnea or wheezes Cardiovascular: Denies palpitation, chest discomfort or lower extremity swelling Gastrointestinal:  (+) Abdominal pain (+) diarrhea, stool incontinence   Skin: Denies abnormal skin rashes Lymphatics: Denies new lymphadenopathy or easy bruising Neurological: (+) Significant neuropathy of hands and feet  Behavioral/Psych: Mood is stable, no new changes  All other systems were reviewed with the patient and are negative.  MEDICAL HISTORY:  Past Medical History:  Diagnosis Date  . Anemia   . Cancer [facility])    colon cancer   . Enlarged thyroid     SURGICAL HISTORY: Past Surgical History:  Procedure Laterality Date  . BOWEL RESECTION    . CERVICAL SPINE SURGERY  06/17/2012   C5-C7 ACDF  . CESAREAN SECTION    . PARTIAL HYSTERECTOMY    . PORT-A-CATH REMOVAL Left 07/28/2015   Procedure: REMOVAL PORT-A-CATH;  Surgeon: [name], MD;  Location: [facility];  Service: General;  Laterality: Left;  . PORTACATH PLACEMENT Left 12/22/2014   Procedure: INSERTION PORT-A-CATH LEFT SUBCLAVIAN;  Surgeon: [name], MD;  Location: [facility];  Service: General;  Laterality: Left;  . TONSILLECTOMY      I have reviewed the social history and family history with the patient and they are unchanged from previous note.  ALLERGIES:  is allergic to other and shellfish allergy.  MEDICATIONS:  Current Outpatient  Medications  Medication Sig Dispense Refill  . acetaminophen (TYLENOL) 500 MG tablet Take 500 mg by mouth every 6 (six) hours as needed.    Marland Kitchen buPROPion (WELLBUTRIN XL) 150 MG 24 hr tablet Take 2 tablets (300 mg total) by mouth daily. 180 tablet 1  . fluticasone (FLONASE) 50 MCG/ACT nasal spray Place 2 sprays into both nostrils daily. 16 g 2  . gabapentin (NEURONTIN) 300 MG capsule Take 2-3 capsules before bed 540 capsule 1  . hydrocortisone cream 0.5 % Apply 1 application topically 2 (two) times daily as needed for itching.    . loratadine (CLARITIN) 10 MG tablet Take 10 mg by mouth daily. AS NEEDED    . metFORMIN (GLUCOPHAGE) 1000 MG tablet Take 1 tablet (1,000 mg total) by mouth 2 (two) times daily with a meal. 60 tablet 1  . metFORMIN (GLUMETZA) 1000 MG (MOD) 24 hr tablet Take 1 tablet (1,000 mg total) by mouth 2 (two) times daily with a meal. 180 tablet 1  . Multiple Vitamin (MULTIVITAMIN) tablet Take 1 tablet by mouth daily.    . sertraline (ZOLOFT) 100 MG tablet TAKE 1.5 TABLETS BY MOUTH DAILY 135 tablet 1   No current facility-administered medications for this visit.     PHYSICAL EXAMINATION: ECOG PERFORMANCE STATUS: 2 - Symptomatic, <50% confined to bed  Vitals:   07/16/19 1416  BP: (!) 142/86  Pulse: 66  Resp: 18  Temp: 98.2 F (36.8 C)  SpO2: 98%   Filed Weights   07/16/19 1416  Weight: 243 lb 3.2 oz (110.3 kg)    GENERAL:alert, no distress and comfortable SKIN: skin color, texture, turgor are normal, no rashes or significant lesions EYES: normal, Conjunctiva are pink and non-injected, sclera clear  NECK: supple, without nodularity (+) enlarged thyroid, tender  LYMPH:  no palpable lymphadenopathy in the cervical, axillary  LUNGS: clear to auscultation and percussion with normal breathing effort HEART: regular rate & rhythm and no murmurs and no lower extremity edema ABDOMEN:abdomen soft, non-tender and normal bowel sounds (+) diffuse abdominal tenderness  Musculoskeletal:no cyanosis of digits and no clubbing  NEURO: alert & oriented x 3 with fluent speech, no focal motor/sensory deficits  LABORATORY DATA:  I have reviewed the data as listed CBC Latest Ref Rng & Units 07/16/2019 02/08/2019 12/28/2018  WBC 4.0 - 10.5 K/uL 6.0 6.6 6.1  Hemoglobin 12.0 - 15.0 g/dL 12.1 11.6(L) 11.5(L)  Hematocrit 36.0 - 46.0 % 38.5 36.9 36.7  Platelets 150 - 400 K/uL 258 306 276     CMP Latest Ref Rng & Units 07/16/2019 02/08/2019 12/28/2018  Glucose 70 - 99 mg/dL 82 164(H) 101(H)  BUN 6 - 20 mg/dL '17 14 11  '$ Creatinine 0.44 - 1.00 mg/dL 0.92 0.85 0.77  Sodium 135 - 145 mmol/L 141 140 143  Potassium 3.5 - 5.1 mmol/L 4.2 3.9 3.9  Chloride 98 - 111 mmol/L 107 104 109  CO2 22 - 32 mmol/L '25 25 26  '$ Calcium 8.9 - 10.3 mg/dL 9.8 10.0 9.5  Total Protein 6.5 - 8.1 g/dL 7.9 7.7 7.5  Total Bilirubin 0.3 - 1.2 mg/dL 0.3 0.3 0.4  Alkaline Phos 38 - 126 U/L 69 73 70  AST 15 - 41 U/L 17 12(L) 16  ALT 0 - 44 U/L '20 15 21      '$ RADIOGRAPHIC STUDIES: I have personally reviewed the radiological images as listed and agreed with the findings in the report. No results found.   ASSESSMENT & PLAN:  Jenny [name] is a 58 y.o. female with   1. Sigmoid colon adenocarcinoma, G2, pT3pN1aM0, stage IIIB, MSI stable  -She is was diagnosed in 10/2014. She is s/p sigmoid colon resection and adjuvant chemo mFOLFOX for 6 months  -She unfortunately developed significant peripheral neuropathy after chemo, she is currently off work, due to significant fatigue, depression, and the neuropathy. -Continue cancer surveillance, no evidence of recurrence so far. -She has been having worsening stool incontinence and abdominal pain, with diffuse abdominal tenderness on exam today. Will obtain a CT scan to further evaluate. She is agreeable. Given her cancer diagnosis was almost 5 years ago I have low suspicion for cancer recurrence.  -She still has significant neuropathy which is partly related to  her prior chemo. Labs reviewed, CBC and CMP WNL.  -f/u in 6 months  2. Peripheral neuropathy, b/l leg and body pain, secondary to chemo, G2-3  -She has developed significant peripheral neuropathy after chemotherapy, with bilateral leg and body pain, numbness and tingling of her fingers and toes. This worsened after her return to work.  -She has previously completed physical therapy and will continue exercise  -Given the possible drug interaction with Zoloft she did not try Cymbalta.  -I discussed she will feel fatigued or drowsy from high dose Neurontin.  -Her neuropathy remains significant in her hands and feet. She takes Neurontin '300mg'$  2-3 times a day -I offered her the chance to  see neurologist Dr. Mickeal Skinner, she declined for now.   3. Obesity and Hepatic steatosis -I encouraged her to follow-up with her primary care physician and check her lipid profile at least once a year. I again encouraged her to lose weight, by diet control and exercise -she has mild fatty liver on prior CT scan.  -Weight stable   4. Diabetes -Only on Metformin, Monitored by PCP -Her HbA1c is 10.7 in 11/2018, improved to 7.5 in 05/2019.  -I encouraged her to watch her diet carefully and avoid fast foods and soft drinks.  -Glucose normal today (07/16/2019)  5. Depressionand anxiety  -Continue on Zoloft as recently prescribed by PCP -She will maintain follow up withthe SW for depressioncounseling her PCPs office -Depression has mildly improved, continue Zoloft  -She notes she tries to fight depression but she remains anxious and nervous from her incontinence and pain.   6. ?Pulmonary hypertension -Her CT scan showed dilated pulmonary artery, concerning for pulmonary hypertension. -she saw Dr. Elsworth Soho. Her 08/20/18 Sleep study was negative for sleep apnea   7. Urinary and stool incontinence, Intermittent Diarrhea /Constipation  -She has diffuse abdominal tenderness. -She notes more several public and at  home accidents from stool incontinence.  -She previously completed PT for pelvic floor. She does not want to be referred back and wants to continue practicing at home. I also encouraged her to other exercises as well such as stationary bike.   8. Recent Mild anemia -I encouraged her to take a prenatal multivitamins.  -Iron panel showed Ferritin 91, Iron sat 15%, otherwise [facility].  -No clinical signs of bleeding. I previously referred her back to see GI Dr. Collene Mares. -Resolved currently   Plan  -CT abdomen and pelvis with scan in 1-2 weeks, I will call her with results  -Lab and f/u in 6 months    No problem-specific Assessment & Plan notes found for this encounter.   Orders Placed This Encounter  Procedures  . CT Abdomen Pelvis W Contrast    Standing Status:   Future    Standing Expiration Date:   07/15/2020    Order Specific Question:   If indicated for the ordered procedure, I authorize the administration of contrast media per Radiology protocol    Answer:   Yes    Order Specific Question:   Is patient pregnant?    Answer:   No    Order Specific Question:   Preferred imaging location?    Answer:   [facility]    Order Specific Question:   Is Oral Contrast requested for this exam?    Answer:   Yes, Per Radiology protocol    Order Specific Question:   Radiology Contrast Protocol - do NOT remove file path    Answer:   \\[email or web address]   All questions were answered. The patient knows to call the clinic with any problems, questions or concerns. No barriers to learning was detected. I spent 20 minutes counseling the patient face to face. The total time spent in the appointment was 25 minutes and more than 50% was on counseling and review of test results     Truitt Merle, MD 07/16/2019   I, Joslyn Devon, am acting as scribe for Truitt Merle, MD.   I have reviewed the above documentation for accuracy and completeness, and I agree with the above.

## 2019-07-16 ENCOUNTER — Inpatient Hospital Stay (HOSPITAL_BASED_OUTPATIENT_CLINIC_OR_DEPARTMENT_OTHER): Payer: BC Managed Care – PPO | Admitting: Hematology

## 2019-07-16 ENCOUNTER — Other Ambulatory Visit: Payer: Self-pay

## 2019-07-16 ENCOUNTER — Telehealth: Payer: Self-pay | Admitting: Hematology

## 2019-07-16 ENCOUNTER — Inpatient Hospital Stay: Payer: BC Managed Care – PPO | Attending: Hematology

## 2019-07-16 ENCOUNTER — Encounter: Payer: Self-pay | Admitting: Hematology

## 2019-07-16 VITALS — BP 142/86 | HR 66 | Temp 98.2°F | Resp 18 | Ht 67.0 in | Wt 243.2 lb

## 2019-07-16 DIAGNOSIS — C772 Secondary and unspecified malignant neoplasm of intra-abdominal lymph nodes: Secondary | ICD-10-CM

## 2019-07-16 DIAGNOSIS — Z7984 Long term (current) use of oral hypoglycemic drugs: Secondary | ICD-10-CM | POA: Insufficient documentation

## 2019-07-16 DIAGNOSIS — G620 Drug-induced polyneuropathy: Secondary | ICD-10-CM

## 2019-07-16 DIAGNOSIS — R197 Diarrhea, unspecified: Secondary | ICD-10-CM | POA: Insufficient documentation

## 2019-07-16 DIAGNOSIS — R109 Unspecified abdominal pain: Secondary | ICD-10-CM | POA: Insufficient documentation

## 2019-07-16 DIAGNOSIS — K760 Fatty (change of) liver, not elsewhere classified: Secondary | ICD-10-CM

## 2019-07-16 DIAGNOSIS — C187 Malignant neoplasm of sigmoid colon: Secondary | ICD-10-CM

## 2019-07-16 DIAGNOSIS — D649 Anemia, unspecified: Secondary | ICD-10-CM

## 2019-07-16 DIAGNOSIS — Z9221 Personal history of antineoplastic chemotherapy: Secondary | ICD-10-CM

## 2019-07-16 DIAGNOSIS — Z85038 Personal history of other malignant neoplasm of large intestine: Secondary | ICD-10-CM | POA: Insufficient documentation

## 2019-07-16 DIAGNOSIS — R1012 Left upper quadrant pain: Secondary | ICD-10-CM | POA: Diagnosis not present

## 2019-07-16 DIAGNOSIS — Z79899 Other long term (current) drug therapy: Secondary | ICD-10-CM | POA: Insufficient documentation

## 2019-07-16 DIAGNOSIS — F329 Major depressive disorder, single episode, unspecified: Secondary | ICD-10-CM | POA: Diagnosis not present

## 2019-07-16 DIAGNOSIS — T451X5A Adverse effect of antineoplastic and immunosuppressive drugs, initial encounter: Secondary | ICD-10-CM

## 2019-07-16 DIAGNOSIS — E669 Obesity, unspecified: Secondary | ICD-10-CM | POA: Diagnosis not present

## 2019-07-16 DIAGNOSIS — E1142 Type 2 diabetes mellitus with diabetic polyneuropathy: Secondary | ICD-10-CM | POA: Insufficient documentation

## 2019-07-16 DIAGNOSIS — R10819 Abdominal tenderness, unspecified site: Secondary | ICD-10-CM

## 2019-07-16 DIAGNOSIS — F419 Anxiety disorder, unspecified: Secondary | ICD-10-CM | POA: Insufficient documentation

## 2019-07-16 DIAGNOSIS — I272 Pulmonary hypertension, unspecified: Secondary | ICD-10-CM | POA: Insufficient documentation

## 2019-07-16 DIAGNOSIS — R5383 Other fatigue: Secondary | ICD-10-CM | POA: Diagnosis not present

## 2019-07-16 DIAGNOSIS — R159 Full incontinence of feces: Secondary | ICD-10-CM

## 2019-07-16 LAB — CBC WITH DIFFERENTIAL/PLATELET
Abs Immature Granulocytes: 0.01 10*3/uL (ref 0.00–0.07)
Basophils Absolute: 0 10*3/uL (ref 0.0–0.1)
Basophils Relative: 0 %
Eosinophils Absolute: 0.1 10*3/uL (ref 0.0–0.5)
Eosinophils Relative: 2 %
HCT: 38.5 % (ref 36.0–46.0)
Hemoglobin: 12.1 g/dL (ref 12.0–15.0)
Immature Granulocytes: 0 %
Lymphocytes Relative: 47 %
Lymphs Abs: 2.8 10*3/uL (ref 0.7–4.0)
MCH: 25.3 pg — ABNORMAL LOW (ref 26.0–34.0)
MCHC: 31.4 g/dL (ref 30.0–36.0)
MCV: 80.5 fL (ref 80.0–100.0)
Monocytes Absolute: 0.3 10*3/uL (ref 0.1–1.0)
Monocytes Relative: 5 %
Neutro Abs: 2.8 10*3/uL (ref 1.7–7.7)
Neutrophils Relative %: 46 %
Platelets: 258 10*3/uL (ref 150–400)
RBC: 4.78 MIL/uL (ref 3.87–5.11)
RDW: 16.6 % — ABNORMAL HIGH (ref 11.5–15.5)
WBC: 6 10*3/uL (ref 4.0–10.5)
nRBC: 0 % (ref 0.0–0.2)

## 2019-07-16 LAB — COMPREHENSIVE METABOLIC PANEL
ALT: 20 U/L (ref 0–44)
AST: 17 U/L (ref 15–41)
Albumin: 4.2 g/dL (ref 3.5–5.0)
Alkaline Phosphatase: 69 U/L (ref 38–126)
Anion gap: 9 (ref 5–15)
BUN: 17 mg/dL (ref 6–20)
CO2: 25 mmol/L (ref 22–32)
Calcium: 9.8 mg/dL (ref 8.9–10.3)
Chloride: 107 mmol/L (ref 98–111)
Creatinine, Ser: 0.92 mg/dL (ref 0.44–1.00)
GFR calc Af Amer: >60 mL/min (ref >60)
GFR calc non Af Amer: >60 mL/min (ref >60)
Glucose, Bld: 82 mg/dL (ref 70–99)
Potassium: 4.2 mmol/L (ref 3.5–5.1)
Sodium: 141 mmol/L (ref 135–145)
Total Bilirubin: 0.3 mg/dL (ref 0.3–1.2)
Total Protein: 7.9 g/dL (ref 6.5–8.1)

## 2019-07-16 LAB — CEA (IN HOUSE-CHCC): CEA (CHCC-In House): <1 ng/mL (ref 0.00–5.00)

## 2019-07-16 NOTE — Telephone Encounter (Signed)
Scheduled per 07/24 los. Patient received avs and calendar.

## 2019-07-19 ENCOUNTER — Telehealth (HOSPITAL_COMMUNITY): Payer: Self-pay

## 2019-07-19 NOTE — Telephone Encounter (Signed)
-----   Message from April H Pait sent at 07/19/2019  8:37 AM EDT -----  ----- Message ----- From: Truitt Merle, MD Sent: 07/16/2019   4:53 PM EDT To: Jonnie Finner, RN, April H Pait, #  Please get her CT abd/pel w contrast precert, and schedule in 1-2 weeks  Thanks   Krista Blue

## 2019-07-22 ENCOUNTER — Telehealth: Payer: Self-pay | Admitting: Family Medicine

## 2019-07-22 NOTE — Telephone Encounter (Signed)
She is still feeling down a lot and feels like she is fighting to maintain her current state.  Taking her medications regularly\ Recommend that we refer her to a psychiatrist to get further help.  She agrees.  Would like to see Dr Bernerd Pho if possible   Feels she is dehydrated sweaty a lot.  Had normal bloodwork on 7/24 with visit with Dr Burr Medico.  Asked her to drink more and if not better in a few days to come in for a visit

## 2019-07-27 ENCOUNTER — Ambulatory Visit (INDEPENDENT_AMBULATORY_CARE_PROVIDER_SITE_OTHER): Payer: BC Managed Care – PPO | Admitting: Licensed Clinical Social Worker

## 2019-07-27 ENCOUNTER — Other Ambulatory Visit: Payer: Self-pay

## 2019-07-27 DIAGNOSIS — Z7189 Other specified counseling: Secondary | ICD-10-CM

## 2019-07-27 NOTE — BH Specialist Note (Signed)
Integrated Behavioral Health Follow Up Visit  MRN: [id number] Name: Jenny [name]  Total time: 20 minutes Type of Visit: Telephonic Patient location: home LCSW  location: [facility] office persons other than patient participating in visit: none  Confirmed patient's name: Yes  Discussed confidentiality: Yes   "The purpose of this phone visit is to provide behavioral health care while limiting exposure to the coronavirus (COVID19). " "By engaging in this telephone visit, you consent to the provision of healthcare."   Reason for follow-up: Continue brief intervention to assist patient with managing symptoms of anxiety and depression, as well as managing stressors .  Report of symptoms: continues to feel tired, difficult with focus, feeling frustrated, feels lost ; difficulty in daily functions. Patient reports taking medication with no missed dose. ASSESSMENT: Mood: Depressed ;Thought process: Coherent; No plan to harm self or others.  Patient's speech is slow today, she seem to be making slow progress towards goal.  States "I feel like I should be better by now. Patient continues to experience symptoms of depression which are exacerbated by not being able to manage current life adjustments which are triggered by adverse childhood experiences (ACES).  Patient may benefit from, and is in agreement to seek ongoing psychiatry treatment with Dr. Bernerd Pho.  She will also need ongoing therapy with Trauma Focused intervention.LCSW will not be able to continue brief interventions with patient as her needs exceed that of our practice.  GOALS:Patient will: 1. Reduce symptoms of: anxiety and depression 2. Increase knowledge and/or ability of: coping skills and self-management skills  3.   Connect to psychiatry and ongoing Trauma focused counseling   PLAN: Referral: Psychiatrist and Counselor LCSW will F/U to make sure patient is connected to services.   Intervention: Client interviewed and appropriate  assessments performed.   Other interventions include:,Solution-Focused Strategies, Reflective listening, Behavioral Therapy (Relaxed breathing); Psychoeducation, Supportive Counseling and Referral to Psychiatrist    Dr Erin Hearing and Dr. Denyce Robert has been notified of this intervention and plan.  [name], LCSW Clinical Social Worker [place] / [place]   [phone] 4:16 PM

## 2019-07-30 ENCOUNTER — Telehealth: Payer: Self-pay | Admitting: *Deleted

## 2019-07-30 ENCOUNTER — Ambulatory Visit (HOSPITAL_COMMUNITY)
Admission: RE | Admit: 2019-07-30 | Discharge: 2019-07-30 | Disposition: A | Discharge status: Home / Self Care | Payer: BC Managed Care – PPO | Source: Ambulatory Visit | Attending: Hematology | Admitting: Hematology

## 2019-07-30 ENCOUNTER — Other Ambulatory Visit: Payer: Self-pay

## 2019-07-30 DIAGNOSIS — C187 Malignant neoplasm of sigmoid colon: Secondary | ICD-10-CM

## 2019-07-30 DIAGNOSIS — C772 Secondary and unspecified malignant neoplasm of intra-abdominal lymph nodes: Secondary | ICD-10-CM | POA: Insufficient documentation

## 2019-07-30 MED ORDER — SODIUM CHLORIDE (PF) 0.9 % IJ SOLN
INTRAMUSCULAR | Status: AC
  Filled 2019-07-30: qty 50

## 2019-07-30 MED ORDER — IOHEXOL 300 MG/ML  SOLN
100.0000 mL | Freq: Once | INTRAMUSCULAR | Status: AC | PRN
  Administered 2019-07-30: 100 mL via INTRAVENOUS

## 2019-07-30 NOTE — Telephone Encounter (Signed)
Left message I need details about what type of letter

## 2019-07-30 NOTE — Telephone Encounter (Signed)
Pt states that she spoke with Dr. Erin Hearing and [name], LCSW about additional paperwork needed by her HR.  She is checking hte status.  Christen Bame, CMA

## 2019-08-02 ENCOUNTER — Telehealth: Payer: Self-pay | Admitting: *Deleted

## 2019-08-02 NOTE — Telephone Encounter (Signed)
Pt calls because the forms that were signed on 06/30/19 and scanned into chart on 07/12/19 say 2 different things according to her HR department.  On the second paged that is scanned in the duration says one month  On page 5 it says permament incapacitation.   These cant say 2 different things and they are requesting they be changed, initialed, dated and sent back . Christen Bame, CMA

## 2019-08-02 NOTE — Telephone Encounter (Signed)
Called NA Left message to call to discuss details of forms

## 2019-08-04 NOTE — Telephone Encounter (Signed)
Spoke with her  Will refax her disability forms  Will write a letter and send to her

## 2019-08-05 ENCOUNTER — Telehealth: Payer: Self-pay | Admitting: Licensed Clinical Social Worker

## 2019-08-05 ENCOUNTER — Encounter: Payer: Self-pay | Admitting: Family Medicine

## 2019-08-05 NOTE — Telephone Encounter (Signed)
   Unsuccessful Phone Outreach Note  08/05/2019 Name: Jenny [name] MRN: [id number] DOB: [birth year]  Referred by: Lind Covert, MD,  Reason for referral : Care Coordination (F/U call) .  Jenny [name] is a 58 y.o. year old female who sees Chambliss, Jeb Levering, MD for primary care.  F/U call to patient to see how she is doing and to discuss IOP treatment at [facility]. Telephone outreach was unsuccessful. A HIPPA compliant phone message was left for the patient providing contact information and requesting a return call.  Follow Up Plan:  LCSW will wait for return call.  [name], LCSW Clinical Social Worker [place] / [place]   [phone] 2:14 PM

## 2019-08-05 NOTE — Telephone Encounter (Signed)
Pt is calling concerning this form. She said she knows Dr. Erin Hearing is going to have it corrected but that she would like to speak with him because there are a few things that she is not fully understanding.   Please call pt.
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
## 2019-08-05 NOTE — Progress Notes (Signed)
Subjective  Jenny [name] is a 58 y.o. female is presenting with the following   Social History   Social History Narrative  . Not on file     Chief Complaint noted Review of Symptoms - see HPI PMH - Smoking status noted.    Objective Vital Signs reviewed There were no vitals taken for this visit.  Assessments/Plans  See after visit summary for details of patient instuctions  No problem-specific Assessment & Plan notes found for this encounter.

## 2019-08-06 NOTE — Telephone Encounter (Signed)
  Care Coordination  Clinical Social Work Follow Up Note 08/06/2019 Name: [name] MRN: [id number] DOB: 1961/01/25  Referred by: Lind Covert, MD , Reason for referral : Care Coordination (F/U call) ,  Jenny [name] is a 58 y.o. year old female who is a primary care patient of Chambliss, Jeb Levering, MD.    LCSW received return call from patient.  Briefly discussed IOP options at [place] or ongoing therapy at office where her granddaughter goes for therapy.  Patient is open and willing to discuss this as an option.   Plan: LCSW will F/U with patient prior to her appointment with Dr. Modesta Messing.   [name], LCSW Clinical Social Worker [place] / [place]   [phone] 4:06 PM

## 2019-08-06 NOTE — Telephone Encounter (Signed)
Left VM that I have filled out forms and faxed and have mailed copies of form and letter to her

## 2019-08-10 NOTE — Telephone Encounter (Signed)
   Care Coordination Social Work Note  08/10/2019 Name: MACY POLIO MRN: [id number] DOB: December 30, 1960  [name] is a 58 y.o. year old female who sees Chambliss, Jeb Levering, MD for primary care.   LCSW received call from patient to review and discuss ongoing therapy options ( IOP at [facility], other therapist) Patient is having a difficult time with so much change which is causing an increase in her anxiety level. Patient may benefit from and is in agreement to for LCSW to continue brief interventions until she is able to connect to Dr. Hartford Poli for ongoing counseling.  Plan: Patient will schedule phone appointment with LCSW next week.   Dr. Modesta Messing has been informed of this plan  [name], [place] / [place]   [phone] 3:50 PM

## 2019-08-11 ENCOUNTER — Other Ambulatory Visit: Payer: Self-pay

## 2019-08-11 ENCOUNTER — Encounter (HOSPITAL_COMMUNITY): Payer: Self-pay | Admitting: Psychiatry

## 2019-08-11 ENCOUNTER — Ambulatory Visit (INDEPENDENT_AMBULATORY_CARE_PROVIDER_SITE_OTHER): Payer: BC Managed Care – PPO | Admitting: Psychiatry

## 2019-08-11 DIAGNOSIS — F332 Major depressive disorder, recurrent severe without psychotic features: Secondary | ICD-10-CM

## 2019-08-11 DIAGNOSIS — F4310 Post-traumatic stress disorder, unspecified: Secondary | ICD-10-CM

## 2019-08-11 MED ORDER — BUPROPION HCL ER (XL) 300 MG PO TB24: ORAL_TABLET | ORAL | 0 refills | Status: DC

## 2019-08-11 NOTE — Patient Instructions (Signed)
1. Continue sertraline 150 mg daily  2. Increase bupropion 450 mg (300 mg + 150 mg) daily 3. Referral to PHP (partial hospitalization program) 4. Next appointment: 9/24 at 2:30

## 2019-08-12 ENCOUNTER — Telehealth (HOSPITAL_COMMUNITY): Payer: Self-pay | Admitting: Professional

## 2019-08-17 ENCOUNTER — Telehealth: Payer: Self-pay | Admitting: Licensed Clinical Social Worker

## 2019-08-17 ENCOUNTER — Ambulatory Visit: Payer: BC Managed Care – PPO

## 2019-08-17 NOTE — Telephone Encounter (Signed)
   Unsuccessful Phone Outreach Note  08/17/2019 Name: SHIRELY BIALECKI MRN: [id number] DOB: 1961/04/18  Reason for referral : Care Coordination (F/U call) ;Sula Soda Troia is a 58 y.o. year old female who sees Chambliss, Jeb Levering, MD for primary care.    Called patient to assess needs and barriers reference care coordination for ongoing counseling and therapy resources . Telephone outreach was unsuccessful.  Mailbox full unable to [place] compliant phone message   Per Dr. Modesta Messing she discussed PHP/partial hospitalization program instead of IOP with patient. The group therapist in [place] is planning to reach out to her.   Plan:  LCSW will wait for patient to contact me if needed. No additional F/U at this time unless requested by Dr. Valentina Shaggy or patient.   [name], LCSW Clinical Social Worker [place] / [place]   [phone] 2:29 PM

## 2019-08-18 ENCOUNTER — Ambulatory Visit (INDEPENDENT_AMBULATORY_CARE_PROVIDER_SITE_OTHER): Payer: BC Managed Care – PPO | Admitting: Family Medicine

## 2019-08-18 ENCOUNTER — Encounter: Payer: Self-pay | Admitting: Family Medicine

## 2019-08-18 ENCOUNTER — Telehealth (HOSPITAL_COMMUNITY): Payer: Self-pay | Admitting: Psychiatry

## 2019-08-18 ENCOUNTER — Other Ambulatory Visit: Payer: Self-pay

## 2019-08-18 DIAGNOSIS — L282 Other prurigo: Secondary | ICD-10-CM

## 2019-08-18 NOTE — Assessment & Plan Note (Signed)
I don't have a specific dx.  I have recommended non specific treatments (see AVS)  FU two weeks if fails to improve.

## 2019-08-18 NOTE — Patient Instructions (Signed)
I am sorry, I am not sure what is causing the rash.  I do have some general recommendations. 1. Start taking Claritin/loratidine 10 mg every day.  It is over the counter and can help with itching. 2. Stop using calamine or benadryl cream.  Some people have an allergic reaction to it. 3. Please use either baby powder or talcum powder in the skin folds.  That will help with the heat rash problem 4. Try to stay out of the sun.  It sounds like the sun is making it worse. 5. Get a large tube of over the counter cortisone cream 1 or 2 %.  Apply a very light coat of the cortisone cream twice a day to the areas that itch the most.    Call us back if it is not getting better.

## 2019-08-18 NOTE — Telephone Encounter (Signed)
Could you contact the patient and make sure she contacts PHP (partial hospitalization program) [phone]. I believe the group therapist, Loistine Chance tried to contact the patient as well.

## 2019-08-18 NOTE — Progress Notes (Signed)
 Established Patient Office Visit  Subjective:  Patient ID: Jenny [name], female    DOB: 09/06/1961  Age: 58 y.o. MRN: [id number]  CC:  Chief Complaint  Patient presents with  . Rash    HPI Jenny [name] presents for rash.  Puritic rash x 2 weeks.  Began a day or two after mowing the lawn.  Has tried oral benadryl plus topical benadryl and calamine.  Rash has not changed over the two weeks.  Mostly on extensor surface of arms and legs.  Has some in back of knees and in left axilla.  Sun seems to make worse.  No new meds, soaps or lotions other than benadryl and calamine.  No constitutional Sx.  Claritin is on her med list but she has not been taking regularly.  Past Medical History:  Diagnosis Date  . Anemia   . Cancer (HCC)    colon cancer   . Enlarged thyroid     Past Surgical History:  Procedure Laterality Date  . BOWEL RESECTION    . CERVICAL SPINE SURGERY  06/17/2012   C5-C7 ACDF  . CESAREAN SECTION    . PARTIAL HYSTERECTOMY    . PORT-A-CATH REMOVAL Left 07/28/2015   Procedure: REMOVAL PORT-A-CATH;  Surgeon: Alicia Thomas, MD;  Location: [facility];  Service: General;  Laterality: Left;  . PORTACATH PLACEMENT Left 12/22/2014   Procedure: INSERTION PORT-A-CATH LEFT SUBCLAVIAN;  Surgeon: Alicia Thomas, MD;  Location: [facility];  Service: General;  Laterality: Left;  . TONSILLECTOMY      Family History  Problem Relation Age of Onset  . Cancer Brother   . Cancer Brother   . Hypertension Mother   . Colon cancer Neg Hx     Social History   Socioeconomic History  . Marital status: Married    Spouse name: Not on file  . Number of children: Not on file  . Years of education: Not on file  . Highest education level: Not on file  Occupational History  . Not on file  Social Needs  . Financial resource strain: Not very hard  . Food insecurity    Worry: Never true    Inability: Never true  . Transportation needs    Medical: No    Non-medical: No  Tobacco Use  . Smoking  status: Never Smoker  . Smokeless tobacco: Never Used  Substance and Sexual Activity  . Alcohol use: No  . Drug use: No  . Sexual activity: Not on file  Lifestyle  . Physical activity    Days per week: 2 days    Minutes per session: Not on file  . Stress: To some extent  Relationships  . Social connections    Talks on phone: Not on file    Gets together: Not on file    Attends religious service: Not on file    Active member of club or organization: Not on file    Attends meetings of clubs or organizations: Not on file    Relationship status: Not on file  . Intimate partner violence    Fear of current or ex partner: Not on file    Emotionally abused: Not on file    Physically abused: Not on file    Forced sexual activity: Not on file  Other Topics Concern  . Not on file  Social History Narrative  . Not on file    Outpatient Medications Prior to Visit  Medication Sig Dispense Refill  . acetaminophen (TYLENOL) 500 MG   tablet Take 500 mg by mouth every 6 (six) hours as needed.    Marland Kitchen buPROPion (WELLBUTRIN XL) 150 MG 24 hr tablet Take 2 tablets (300 mg total) by mouth daily. 180 tablet 1  . buPROPion (WELLBUTRIN XL) 300 MG 24 hr tablet Take total of 450 mg daily (300 mg + 150 mg) 90 tablet 0  . fluticasone (FLONASE) 50 MCG/ACT nasal spray Place 2 sprays into both nostrils daily. 16 g 2  . gabapentin (NEURONTIN) 300 MG capsule Take 2-3 capsules before bed 540 capsule 1  . hydrocortisone cream 0.5 % Apply 1 application topically 2 (two) times daily as needed for itching.    . loratadine (CLARITIN) 10 MG tablet Take 10 mg by mouth daily. AS NEEDED    . metFORMIN (GLUCOPHAGE) 1000 MG tablet Take 1 tablet (1,000 mg total) by mouth 2 (two) times daily with a meal. 60 tablet 1  . metFORMIN (GLUMETZA) 1000 MG (MOD) 24 hr tablet Take 1 tablet (1,000 mg total) by mouth 2 (two) times daily with a meal. 180 tablet 1  . Multiple Vitamin (MULTIVITAMIN) tablet Take 1 tablet by mouth daily.    .  sertraline (ZOLOFT) 100 MG tablet TAKE 1.5 TABLETS BY MOUTH DAILY 135 tablet 1   No facility-administered medications prior to visit.     Allergies  Allergen Reactions  . Other Rash    *Derma Bond*  . Shellfish Allergy Rash    ROS Review of Systems    Objective:    Physical Exam  BP 116/74   Pulse 73   Wt 245 lb 3.2 oz (111.2 kg)   SpO2 99%   BMI 38.40 kg/m  Wt Readings from Last 3 Encounters:  08/18/19 245 lb 3.2 oz (111.2 kg)  07/16/19 243 lb 3.2 oz (110.3 kg)  06/22/19 244 lb 6.4 oz (110.9 kg)   Non specific maculopapular rash.  No folliculitis.  Does not seem urticaria.  She seems to have pruritis out of proportion to the rash.   Health Maintenance Due  Topic Date Due  . OPHTHALMOLOGY EXAM  07/08/1973  . DTaP/Tdap/Td (1 - Tdap) 07/08/1982  . TETANUS/TDAP  07/08/1982  . FOOT EXAM  06/03/2019  . URINE MICROALBUMIN  06/03/2019  . INFLUENZA VACCINE  07/24/2019    There are no preventive care reminders to display for this patient.  Lab Results  Component Value Date   TSH 1.070 02/25/2018   Lab Results  Component Value Date   WBC 6.0 07/16/2019   HGB 12.1 07/16/2019   HCT 38.5 07/16/2019   MCV 80.5 07/16/2019   PLT 258 07/16/2019   Lab Results  Component Value Date   NA 141 07/16/2019   K 4.2 07/16/2019   CHLORIDE 106 06/18/2017   CO2 25 07/16/2019   GLUCOSE 82 07/16/2019   BUN 17 07/16/2019   CREATININE 0.92 07/16/2019   BILITOT 0.3 07/16/2019   ALKPHOS 69 07/16/2019   AST 17 07/16/2019   ALT 20 07/16/2019   PROT 7.9 07/16/2019   ALBUMIN 4.2 07/16/2019   CALCIUM 9.8 07/16/2019   ANIONGAP 9 07/16/2019   EGFR >90 06/18/2017   Lab Results  Component Value Date   CHOL 150 12/25/2016   Lab Results  Component Value Date   HDL 41 (L) 12/25/2016   Lab Results  Component Value Date   LDLCALC 74 12/25/2016   Lab Results  Component Value Date   TRIG 174 (H) 12/25/2016   Lab Results  Component Value Date   CHOLHDL 3.7  12/25/2016   Lab  Results  Component Value Date   HGBA1C 7.5 (A) 06/22/2019      Assessment & Plan:   Problem List Items Addressed This Visit    None      No orders of the defined types were placed in this encounter.   Follow-up: No follow-ups on file.    Zenia Resides, MD

## 2019-08-18 NOTE — Telephone Encounter (Signed)
MAIL BOX IS FULL UNABLE TO LVM PER PER PROVIDER: contact the patient and make sure she contacts PHP (partial hospitalization program) [phone]. I believe the group therapist, Loistine Chance tried to contact the patient as well

## 2019-08-19 ENCOUNTER — Telehealth: Payer: Self-pay | Admitting: Licensed Clinical Social Worker

## 2019-08-19 NOTE — Telephone Encounter (Signed)
2nd Unsuccessful Phone Outreach Note  08/19/2019 Name: Jenny [name] MRN: [id number] DOB: 21-Feb-1961  Reason for referral : Care Coordination (F/U call)   Jenny [name] is a 58 y.o. year old female who sees Chambliss, Jeb Levering, MD for primary care. 2nd unsuccessful telephone outreach attempt to Jenny [name] [facility] today to discuss ongoing treatment for therapy options. Unable to leave phone message, mailbox is full.  Plan: will reach out to Jenny [name] [facility] again over the next 14 days. If unable to reach  Updated provided to Dr. Modesta Messing.  [name], LCSW Clinical Social Worker [place] / [place]   [phone] 3:16 PM

## 2019-09-01 ENCOUNTER — Other Ambulatory Visit: Payer: Self-pay

## 2019-09-01 ENCOUNTER — Ambulatory Visit (INDEPENDENT_AMBULATORY_CARE_PROVIDER_SITE_OTHER): Payer: BC Managed Care – PPO | Admitting: Licensed Clinical Social Worker

## 2019-09-01 ENCOUNTER — Telehealth (HOSPITAL_COMMUNITY): Payer: Self-pay | Admitting: Professional

## 2019-09-01 ENCOUNTER — Encounter: Payer: Self-pay | Admitting: Licensed Clinical Social Worker

## 2019-09-01 DIAGNOSIS — Z7189 Other specified counseling: Secondary | ICD-10-CM

## 2019-09-01 NOTE — BH Specialist Note (Signed)
Care Management   Clinical Social Work General Follow Up Note  09/01/2019 Name: Jenny [name] MRN: [id number] DOB: October 10, 1961  Jenny [name] is a 58 y.o. year old female who is a primary care patient of Chambliss, Jeb Levering, MD. LCSW was consulted for assistance with Mental Health Counseling and Resources.   Review of patient status, including review of consultants reports, relevant laboratory and other test results, and collaboration with appropriate care team members and the patient's provider was performed as part of comprehensive patient evaluation and provision of chronic care management services.   Reason for follow-up: phone encounter with patient today for ongoing assessment and brief interventions to assist with managing symptoms of depression and care coordination needs related to connecting to mental health treatment.   Assessment: Patient presented sluggish on the phone today, she continues to experience symptoms of anxiety and frustration which seems to be exacerbated by new family stressor with granddaughter. Patient is making slow progress towards goal.  States taking medication daily but may take it around 2:00 or even at night.  Has not been consistent with taking medication. Reports being tired and lethargic during the day often falling asleep.  Getting up during the night to complete chores. Noticed this change in the past 3 to 4 weeks. Seems to have difficulty with organization.  Recommendation: Patient may benefit from, and is in agreement to talk to [facility] Partial Hospitalization program as recommended by Dr. Modesta Messing. SDOH (Social Determinants of Health) screening performed today: Stress and managing mental health needs . See Care Plan for related entries.   Outpatient Encounter Medications as of 09/01/2019  Medication Sig  . acetaminophen (TYLENOL) 500 MG tablet Take 500 mg by mouth every 6 (six) hours as needed.  Marland Kitchen buPROPion (WELLBUTRIN XL) 150 MG 24 hr tablet Take 2 tablets  (300 mg total) by mouth daily.  Marland Kitchen buPROPion (WELLBUTRIN XL) 300 MG 24 hr tablet Take total of 450 mg daily (300 mg + 150 mg)  . fluticasone (FLONASE) 50 MCG/ACT nasal spray Place 2 sprays into both nostrils daily.  Marland Kitchen gabapentin (NEURONTIN) 300 MG capsule Take 2-3 capsules before bed  . hydrocortisone cream 0.5 % Apply 1 application topically 2 (two) times daily as needed for itching.  . loratadine (CLARITIN) 10 MG tablet Take 10 mg by mouth daily. AS NEEDED  . metFORMIN (GLUCOPHAGE) 1000 MG tablet Take 1 tablet (1,000 mg total) by mouth 2 (two) times daily with a meal.  . metFORMIN (GLUMETZA) 1000 MG (MOD) 24 hr tablet Take 1 tablet (1,000 mg total) by mouth 2 (two) times daily with a meal.  . Multiple Vitamin (MULTIVITAMIN) tablet Take 1 tablet by mouth daily.  . sertraline (ZOLOFT) 100 MG tablet TAKE 1.5 TABLETS BY MOUTH DAILY   No facility-administered encounter medications on file as of 09/01/2019.    Goals Addressed            This Visit's Progress   . take medication same time daily (pt-stated)       Patient stated Goal:  Other goals 1. Reduce symptoms of: anxiety and frustration 2. Increase knowledge and/or ability of: coping skills and self-management skills  Current Barriers:  . Not taking medication consistently same time each day  . New family stressors Clinical Social Work Clinical Goal(s): Over the next 30 days, client will work with SW to address concerns related to managing mental health support Over the next 10 days, patient will work with PHP to address needs related to mental  health treatment. Over the next 5 days, client will follow up with Dr. Ivor Reining office* as directed by SW for next F/U appointment. Interventions: . Patient interviewed and appropriate assessments performed Other interventions: Marland Kitchen Motivational Interviewing and Solution-Focused Strategies,   . Problem-solving teaching/coping strategies and connect with PHP at [facility]   Patient Self Care Activities:   . Currently UNABLE TO independently coordinate care for mental health needs and take medication same time daily Plan:The patient will call Dr. Sela Hilding office* as advised to find out about next appointment.  Appointment scheduled for SW follow up with client by phone on:  Sept. 16th at 8:30 Initial goal documentation      Dr. Modesta Messing notified of this outreach and plan  [name], [place] / [place]   [phone] 9:46 AM

## 2019-09-08 ENCOUNTER — Other Ambulatory Visit: Payer: Self-pay

## 2019-09-08 ENCOUNTER — Ambulatory Visit (INDEPENDENT_AMBULATORY_CARE_PROVIDER_SITE_OTHER): Payer: BC Managed Care – PPO | Admitting: Licensed Clinical Social Worker

## 2019-09-08 DIAGNOSIS — Z719 Counseling, unspecified: Secondary | ICD-10-CM

## 2019-09-08 DIAGNOSIS — R4589 Other symptoms and signs involving emotional state: Secondary | ICD-10-CM

## 2019-09-08 DIAGNOSIS — F329 Major depressive disorder, single episode, unspecified: Secondary | ICD-10-CM

## 2019-09-08 NOTE — BH Specialist Note (Signed)
Care Management Social Work Follow Up Note  09/08/2019 Name: Jenny [name] MRN: [id number] DOB: [birth year]  Referred by: Lind Covert, MD Reason for referral : Care Coordination and Depression  Jenny [name] is a 58 y.o. year old female who is a primary care patient of Chambliss, Jeb Levering, MD.     Review of patient status, including review of consultants reports, and collaboration with appropriate care team members and the patient's provider was performed as part of comprehensive patient evaluation and provision of care management services.    Reason for follow-up: phone encounter with patient today for ongoing assessment and brief interventions to assist with managing symptoms of depression and care coordination for mental health needs.  Report of symptoms/concerns: continues to have difficulty with daily functions and managing health needs. Patient is engaged in conversation today and prepared for phone visit.  She is making progress towards her goal is interested in recommendations from Dr. Modesta Messing.  Recommendation: Patient may benefit from, and is in agreement to set daily goals with taking medication consistently same time, self-care, and Following up with insurance provider to see if they will cover cost of PHP.  SDOH (Social Determinants of Health) screening performed today: Depression  . See Care Plan for related entries.  Advanced Directives Status: discussed with patient during this visit, Patient not ready to have discussion will continue to follow up.  Outpatient Encounter Medications as of 09/08/2019  Medication Sig   acetaminophen (TYLENOL) 500 MG tablet Take 500 mg by mouth every 6 (six) hours as needed.   buPROPion (WELLBUTRIN XL) 150 MG 24 hr tablet Take 2 tablets (300 mg total) by mouth daily.   buPROPion (WELLBUTRIN XL) 300 MG 24 hr tablet Take total of 450 mg daily (300 mg + 150 mg)   fluticasone (FLONASE) 50 MCG/ACT nasal spray Place 2 sprays into both  nostrils daily.   gabapentin (NEURONTIN) 300 MG capsule Take 2-3 capsules before bed   hydrocortisone cream 0.5 % Apply 1 application topically 2 (two) times daily as needed for itching.   loratadine (CLARITIN) 10 MG tablet Take 10 mg by mouth daily. AS NEEDED   metFORMIN (GLUCOPHAGE) 1000 MG tablet Take 1 tablet (1,000 mg total) by mouth 2 (two) times daily with a meal.   metFORMIN (GLUMETZA) 1000 MG (MOD) 24 hr tablet Take 1 tablet (1,000 mg total) by mouth 2 (two) times daily with a meal.   Multiple Vitamin (MULTIVITAMIN) tablet Take 1 tablet by mouth daily.   sertraline (ZOLOFT) 100 MG tablet TAKE 1.5 TABLETS BY MOUTH DAILY   No facility-administered encounter medications on file as of 09/08/2019.      Goals Addressed            This Visit's Progress    increase Self-care       Other goals 1. Reduce symptoms of: stress 2. Increase knowledge and/or ability of: stress reduction  Current Barriers: Patient is focused on family and not allowing for self care Clinical Social Work Clinical Goal(s): over the next two weeks patient will work on identified self care goal Interventions:  Patient interviewed and appropriate assessments performed  Provided patient with information about importance of self-care  Advised patient to identify one day per week to focus on goal Other interventions:  Solution-Focused Strategies,    Problem-solving teaching/coping strategies and Psychoeducation  Patient Self Care Activities:   Currently UNABLE TO independently focus on personal needs Plan: Appointment scheduled for SW follow up with client by phone  on:  Sept 30th  Initial goal documentation      take medication same time daily (pt-stated)       Other goals 1. Increase knowledge and/or ability of: coping skills and self-management skills  Current Barriers:   Not taking medication consistently same time each day  Clinical Social Work Clinical Goal(s): Over the next 30 days,  client will work with SW to address concerns related to managing mental health support Over the next 10 days, patient will work with PHP to address needs related to mental health treatment. Over the next 5 days, client will follow up with Dr. Ivor Reining office* as directed by SW for next F/U appointment. Interventions:  Patient interviewed and appropriate assessments performed Other interventions:  Motivational Interviewing and Solution-Focused Strategies,    Problem-solving teaching/coping strategies and connect with PHP at [facility]   Patient Self Care Activities:   Currently UNABLE TO independently coordinate care for mental health needs and take medication same time daily Plan:The patient will call Dr. Sela Hilding office* as advised to find out about next appointment.   Initial goal documentation      want to feel better (pt-stated)       Other goals 1. Reduce symptoms of: anxiety and depression 2. Increase knowledge and/or ability of: coping skills and self-management skills  Current Barriers: lack of motivation due to feeling tired  Clinical Social Work Clinical Goal(s): Over the next 30 days, patient will work with LCSW to address needs related to managing symptoms of depression Interventions:  Patient interviewed and appropriate assessments performed  Provided mental health counseling with regard to depression (mental health diagnosis or concern) Other interventions:  Solution-Focused Strategies,    Problem-solving teaching/coping strategies and Psychoeducation  Plan:  .SW will follow up with patient by phone over the next 2 weeks  , Patient will keep appointment with Dr. Bernerd Pho Initial goal documentation      Dr. Modesta Messing has been notified of patient's recommendations and plan.  [name], LCSW Clinical Social Worker [place] / [place]   [phone] 10:46 AM
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
## 2019-09-16 ENCOUNTER — Other Ambulatory Visit: Payer: Self-pay

## 2019-09-16 ENCOUNTER — Ambulatory Visit (INDEPENDENT_AMBULATORY_CARE_PROVIDER_SITE_OTHER): Payer: BC Managed Care – PPO | Admitting: Psychiatry

## 2019-09-16 ENCOUNTER — Encounter (HOSPITAL_COMMUNITY): Payer: Self-pay | Admitting: Psychiatry

## 2019-09-16 DIAGNOSIS — F331 Major depressive disorder, recurrent, moderate: Secondary | ICD-10-CM

## 2019-09-16 DIAGNOSIS — F4310 Post-traumatic stress disorder, unspecified: Secondary | ICD-10-CM | POA: Diagnosis not present

## 2019-09-16 NOTE — Patient Instructions (Signed)
1. Continue sertraline 150 mg daily  2. Continue bupropion 450 mg (300 mg + 150 mg) daily 3. Please contact Partial (PHP) [phone]  4. Next appointment: 10/22 at 3:30

## 2019-09-22 ENCOUNTER — Ambulatory Visit: Payer: BC Managed Care – PPO

## 2019-09-22 ENCOUNTER — Telehealth (HOSPITAL_COMMUNITY): Payer: Self-pay | Admitting: Professional

## 2019-09-24 ENCOUNTER — Other Ambulatory Visit: Payer: Self-pay

## 2019-09-24 ENCOUNTER — Telehealth: Payer: Self-pay | Admitting: Licensed Clinical Social Worker

## 2019-09-24 ENCOUNTER — Ambulatory Visit: Payer: BC Managed Care – PPO | Admitting: Licensed Clinical Social Worker

## 2019-09-24 ENCOUNTER — Telehealth: Payer: Self-pay | Admitting: Pharmacist

## 2019-09-24 DIAGNOSIS — Z7189 Other specified counseling: Secondary | ICD-10-CM

## 2019-09-24 NOTE — Telephone Encounter (Signed)
Care coordination F/U

## 2019-09-24 NOTE — Telephone Encounter (Signed)
-----   Message from Maurine Cane, LCSW sent at 09/24/2019 11:23 AM EDT ----- Regarding: coupons or other suggestions Hi Pete, Can one of your students take a look at this chart.  Patient has Jenny [name] but concerned about being able afford co-pay for Sertraline and buPROPion.  I thought Sertraline was relatively in expensive. She says her co-pay for buPROPion is about $80.00 she is almost out and concerned about being able to afford the refill. She spoke to the pharmacy about [facility] and the insurance co-pay was cheaper.   Patient is open to a call from your team.  Please let me know if you are able to assist her with coupons or if you have other suggestions.     I am CC Dr. Modesta Messing as she follows patient and is writing the Rx for meds.  Thanks  [name], LCSW Clinical Social Worker [place] / [place]   [phone] 11:29 AM

## 2019-09-24 NOTE — BH Specialist Note (Signed)
Care Coordination Follow up Phone Note Social Work   09/24/2019 Name: Jenny [name] MRN: [id number] DOB: 1961-12-21  Jenny [name] is a 58 y.o. year old female who sees Chambliss, Jeb Levering, MD for primary care.  Reason for follow-up: phone encounter with patient today to assess needs and barriers for ongoing care coordination support.  Patient reports:concerns with getting letters from providers for disability, not meeting goal of taking medication same time daily, not being able to afford medications $80.00 co-pay for Wellbutrin, and $27.00 for Sertraline,  feeling frustrated and overwhelmed with a lot of moving parts in her life and being able to keep up with appointment as well as returning paper work for [employer] program.  Assessment: Patient continues to experience symptoms of depression which seems to be exacerbated by an ongoing level of frustration with managing her household and personal needs. LCSW Reviewed next appointment day and time with Dr. Modesta Messing and made sure patient set reminder on her phone.  Also made sure patient understands next steps for getting connected with [facility] program as patient often becomes frustrated and has difficulty remembering. Patient Recommendations: Patient may benefit from ongoing counseling with Dr. Hartford Poli at [facility] office.  Patient reports too many changes for her to make this transition right now, will focus on completing and returning  paper work to participate in [facility] program  to assist with managing her symptoms. Goals Addressed            This Visit's Progress   . increase Self-care   On track    Other goals 1. Reduce symptoms of: stress 2. Increase knowledge and/or ability of: stress reduction  Current Barriers: Patient is focused on family and not allowing for self care Clinical Social Work Clinical Goal(s): over the next two weeks patient will work on self care goal, and things she enjoys doing Interventions:   . Solution-Focused Strategies,   . Problem-solving teaching/coping strategies and Psychoeducation  Patient Self Care Activities:  . Currently UNABLE TO independently focus on personal needs Plan: Patient will continue to focus on self-care and hygiene    . take medication same time daily (pt-stated)   Not on track    Current Barriers:  . Not taking medication consistently same time each day  Clinical Social Work Clinical Goal(s): Over the next 30 days, client will work with SW to address concerns related to managing mental health treatment with [facility] program  Interventions:  Marland Kitchen Motivational Interviewing and Solution-Focused Strategies,   .  Problem-solving teaching/coping strategies and task centered. Patient Self Care Activities:  . Currently UNABLE TO independently coordinate care for mental health needs and take medication same time daily Plan:Patient will take medication same time daily   Initial goal documentation     . want to feel better (pt-stated)   Not on track    Other goals 1. Reduce symptoms of: anxiety and depression 2. Increase knowledge and/or ability of: coping skills and self-management skills  Current Barriers: lack of motivation due to feeling tired  Clinical Social Work Clinical Goal(s): Over the next 30 days, patient will work with LCSW to coordinated care related to managing mental health needs Interventions: Reviewed and confirmed day and time for next appointment with Dr. Modesta Messing. Other interventions: . Solution-Focused Strategies,   . Problem-solving teaching/coping strategies and Psychoeducation  Plan:  1. SW will follow up with patient by phone over the next 2 weeks  ,   2.  Patient will  be timely for appointment with Dr. Bernerd Pho  Initial goal documentation       Plan:  1. Appointment scheduled for SW follow up with client by phone on:  October 14th for ongoing care coordination support. 2 .  Patient will F/U with medical providers for needed letter to  support disability  3.   LCSW will consult with PCP for patient's request for a 2nd letter . 4. LCSW will consult with pharmacy to see if there are coupons for cheaper options for     patient.   Dr. Modesta Messing has been informed of this outreach and plan.  [name], LCSW Clinical Social Worker [place] / [place]   [phone] 11:05 AM

## 2019-09-24 NOTE — Telephone Encounter (Signed)
Attempted to contact patient - no answer - asked to have her call us back.   Not sure if the prices provided were for 30 or 90 days supply.  Medication costs for both of these medications w/o insurance is likely to be better at [facility] is the prices were for 30 day supply.   90 day supply of sertraline 150mg  should be ~ $36  90 day supply of bupropion XL 450mg  is likely to be ~ $120  These doses are high which is most likely why the prices are so high for the w/o insurance price.   Patient likely has a low % cost (low amount of assistance card).   We may want to determine which [facility] would be her preference and price shop.   Both of these agents are old and coupon cards are unlikely available.

## 2019-09-27 ENCOUNTER — Telehealth (HOSPITAL_COMMUNITY): Payer: Self-pay | Admitting: *Deleted

## 2019-09-27 ENCOUNTER — Encounter (HOSPITAL_COMMUNITY): Payer: Self-pay | Admitting: Psychiatry

## 2019-09-27 ENCOUNTER — Telehealth: Payer: Self-pay

## 2019-09-27 ENCOUNTER — Other Ambulatory Visit: Payer: Self-pay | Admitting: Hematology

## 2019-09-27 NOTE — Telephone Encounter (Signed)
I have completed and signed the letter.   Truitt Merle MD

## 2019-09-27 NOTE — Telephone Encounter (Signed)
Could you ask her what information needs to be included in the letter? Thanks.

## 2019-09-27 NOTE — Telephone Encounter (Signed)
Patient calls needing a letter for her job stating her health condition and that she continues to suffer with neuropathy, how often she has to be seen by Dr. Burr Medico.  She would like this mailed to her home address.  She is trying to get her disability extended.

## 2019-09-27 NOTE — Telephone Encounter (Signed)
Information requested: when she started seeing you & when her next appointments are scheduled

## 2019-09-27 NOTE — Telephone Encounter (Signed)
Done

## 2019-09-27 NOTE — Telephone Encounter (Signed)
Patient called stated her job requested for her disability when she started seeing you  & when her next appt 10/14/19 are scheduled. Needs to be in letter form. Informed patient that she will need to have ROI . She agreed & will come to office to complete ROI

## 2019-09-28 ENCOUNTER — Telehealth: Payer: Self-pay

## 2019-09-28 NOTE — Telephone Encounter (Signed)
Mailed letter to patient's home address that she requested.

## 2019-09-30 ENCOUNTER — Encounter: Payer: Self-pay | Admitting: Family Medicine

## 2019-10-06 ENCOUNTER — Ambulatory Visit (INDEPENDENT_AMBULATORY_CARE_PROVIDER_SITE_OTHER): Payer: Self-pay | Admitting: Licensed Clinical Social Worker

## 2019-10-06 ENCOUNTER — Other Ambulatory Visit: Payer: Self-pay

## 2019-10-06 DIAGNOSIS — Z7189 Other specified counseling: Secondary | ICD-10-CM

## 2019-10-06 NOTE — BH Specialist Note (Signed)
Care Coordination Follow up Phone Note Social Work   10/06/2019 Name: Jenny [name] MRN: [id number] DOB: [birth year]  Jenny [name] is a 58 y.o. year old female who sees Chambliss, Jeb Levering, MD for primary care.  Reason for follow-up: phone encounter with patient today for ongoing care coordination for needs and barriers. Patient reports:she continues to feel tired, sad and frustrated with her current state.  Feels like she is not making progress.  "It's as if I have been fighting and feel worn out from trying".  Reports she walked 3 days last week and took medication on schedule. She had a set back this week with walking, and taking medication same time each day. Reports no missed doses of medication. Assessment: Patient continues to currently experiencing symptoms of  depression which seems to be exacerbated by childhood trauma. Patient is making progress towards goal. This is also evident by statements from patient and her ability to accomplish several task leading towards her previous goals.   Patient Recommendations: Patient may benefit from and is in agreement to receive further assessment and therapeutic interventions from Dr. Hartford Poli, psychologist af Family [facility] and [facility] to assist with managing her symptoms. Goals Addressed            This Visit's Progress   . increase Self-care   On track    1. Reduce symptoms of: stress 2. Increase knowledge and/or ability of: stress reduction  Current Barriers: Patient is focused on family and not allowing time for self care Clinical Social Work Clinical Goal(s): over the next two weeks patient will work on self care goal, and things she enjoys doing Interventions: Discussed things patient enjoys doing ( singing hymns), scheduling appointment with PCP,   . Solution-Focused Strategies,   . Problem-solving teaching/coping strategies and Psychoeducation  Patient Self Care Activities:  . Currently UNABLE TO independently focus on personal  needs Plan: Patient will continue to focus on self-care and hygiene    . take medication same time daily (pt-stated)   On track    Current Barriers:  . Not taking medication consistently same time each day  Clinical Social Work Clinical Goal(s): Over the next 30 days, client will work with SW to address concerns related to managing mental health treatment with [facility] program  Interventions:  Marland Kitchen Motivational Interviewing and Solution-Focused Strategies,   .  Problem-solving teaching/coping strategies and task centered. Patient Self Care Activities:  . Currently UNABLE TO independently coordinate care for mental health needs and take medication same time daily Plan:Patient will take medication same time daily   Initial goal documentation     . want to feel better (pt-stated)   On track    Other goals 1. Reduce symptoms of: anxiety and depression 2. Increase knowledge and/or ability of: coping skills and self-management skills  Current Barriers: lack of motivation due to feeling tired  Clinical Social Work Clinical Goal(s): Over the next 30 days, patient will work with LCSW to coordinated care related to managing mental health needs Interventions: Discussed importance of returning paper work for [employer], Other interventions: . Solution-Focused Strategies,   . Problem-solving teaching/coping strategies and Psychoeducation  . Behavioral Actication . Brief CBT Plan:  1. SW will follow up with patient by phone over the next 2 weeks  ,  2. Patient will complete paperwork for PCP program and turn it in this week 3. Patient will call and schedule appointment with Dr. Hartford Poli    Initial goal documentation  Plan Summary :  1. Next PCP appointment scheduled for: October 27th at 4:10 2. The patient will call Dr. Hilbert Corrigan * as advised to schedule therapy appointment.   3. Patient will complete paper work for [facility] PHP program 4. LCSW will F/U with patient in 2 days to check on progress of of  2 and 3 above  Dr. Erin Hearing and Dr. Modesta Messing have been informed of this outreach and plan.  [name], LCSW Clinical Social Worker [place] / [place]   [phone] 10:00 AM
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
## 2019-10-08 ENCOUNTER — Telehealth: Payer: Self-pay | Admitting: Licensed Clinical Social Worker

## 2019-10-08 NOTE — Telephone Encounter (Signed)
Care Coordination Follow up Phone Note Social Work   10/08/2019 Name: Jenny [name] MRN: [id number] DOB: 03-10-1961  Jenny [name] is a 58 y.o. year old female who sees Chambliss, Jeb Levering, MD for primary care.  Reason for follow-up: phone encounter with patient today for ongoing assessment of barriers for mental health treatment and completing paper work for  [facility] PHP program. Patient reports: starting paper work but has not been able to finish. Goals: complete paper work this weekend Interventions: Patient is pleasant and engaged in conversation today. Discussed importance of completing paperwork, barriers and goals. Other interventions:Solution-Focused Strategies,    Plan:  1. SW will follow up with patient by phone over the next 2 to 3 days 2. Patient will complete paper work and turn it in a [facility], [place] / [place]   [phone] 3:08 PM

## 2019-10-13 ENCOUNTER — Telehealth: Payer: Self-pay | Admitting: Licensed Clinical Social Worker

## 2019-10-13 NOTE — Telephone Encounter (Signed)
   Unsuccessful Phone Outreach Note  10/13/2019 Name: Jenny [name] MRN: [id number] DOB: 03-20-1961  Reason for referral : Other (F/U call)   F/U call to patient to see if she is progressing on goals discussed. Telephone outreach was unsuccessful. A HIPPA compliant phone message was left.  Plan: Will wait for return call.  If no returned call will F/U with patient in 2 weeks.  [name], LCSW Clinical Social Worker [place] / [place]   [phone] 3:28 PM

## 2019-10-14 ENCOUNTER — Ambulatory Visit (INDEPENDENT_AMBULATORY_CARE_PROVIDER_SITE_OTHER): Payer: BC Managed Care – PPO | Admitting: Psychiatry

## 2019-10-14 ENCOUNTER — Other Ambulatory Visit: Payer: Self-pay

## 2019-10-14 ENCOUNTER — Encounter (HOSPITAL_COMMUNITY): Payer: Self-pay | Admitting: Psychiatry

## 2019-10-14 DIAGNOSIS — F4310 Post-traumatic stress disorder, unspecified: Secondary | ICD-10-CM

## 2019-10-14 DIAGNOSIS — F331 Major depressive disorder, recurrent, moderate: Secondary | ICD-10-CM

## 2019-10-14 MED ORDER — ARIPIPRAZOLE 2 MG PO TABS: 2.0000 mg | ORAL_TABLET | Freq: Every day | ORAL | 0 refills | Status: DC

## 2019-10-14 MED ORDER — BUPROPION HCL ER (XL) 300 MG PO TB24: ORAL_TABLET | ORAL | 0 refills | Status: DC

## 2019-10-14 MED ORDER — BUPROPION HCL ER (XL) 150 MG PO TB24: ORAL_TABLET | ORAL | 0 refills | Status: DC

## 2019-10-14 MED ORDER — SERTRALINE HCL 100 MG PO TABS: 150.0000 mg | ORAL_TABLET | Freq: Every day | ORAL | 0 refills | Status: DC

## 2019-10-14 NOTE — Patient Instructions (Signed)
1. Continue sertraline 150 mg daily  2. Continue bupropion 450 mg (300 mg + 150 mg) daily 3. Start Abilify 2 mg at night  4. Number for  PHP: [id number]. Next appointment: 11/17 at 2:30

## 2019-10-19 ENCOUNTER — Ambulatory Visit: Payer: BC Managed Care – PPO | Admitting: Licensed Clinical Social Worker

## 2019-10-19 ENCOUNTER — Encounter: Payer: Self-pay | Admitting: Family Medicine

## 2019-10-19 ENCOUNTER — Other Ambulatory Visit: Payer: Self-pay

## 2019-10-19 ENCOUNTER — Ambulatory Visit (INDEPENDENT_AMBULATORY_CARE_PROVIDER_SITE_OTHER): Payer: BC Managed Care – PPO | Admitting: Family Medicine

## 2019-10-19 VITALS — BP 132/84 | HR 81 | Wt 249.0 lb

## 2019-10-19 DIAGNOSIS — Z23 Encounter for immunization: Secondary | ICD-10-CM

## 2019-10-19 DIAGNOSIS — M542 Cervicalgia: Secondary | ICD-10-CM | POA: Diagnosis not present

## 2019-10-19 DIAGNOSIS — E1165 Type 2 diabetes mellitus with hyperglycemia: Secondary | ICD-10-CM

## 2019-10-19 DIAGNOSIS — E119 Type 2 diabetes mellitus without complications: Secondary | ICD-10-CM

## 2019-10-19 DIAGNOSIS — E010 Iodine-deficiency related diffuse (endemic) goiter: Secondary | ICD-10-CM

## 2019-10-19 DIAGNOSIS — Z7189 Other specified counseling: Secondary | ICD-10-CM

## 2019-10-19 LAB — POCT GLYCOSYLATED HEMOGLOBIN (HGB A1C): HbA1c, POC (controlled diabetic range): 7.4 % — AB (ref 0.0–7.0)

## 2019-10-19 NOTE — Assessment & Plan Note (Signed)
This has waxed and waned over months.  Did not ever get imaging as ordered last visit.  Seems most consistent with a musculoskeletal cause but with history of cancer need to rule out mets.   She was concerned her goiter could be causing this but doubt.  Could be cervical impingement.  Continue multiple analgesics.  May need Physical Therapy

## 2019-10-19 NOTE — Progress Notes (Signed)
Subjective  Jenny [name] is a 58 y.o. female is presenting with the following  DIABETES Disease Monitoring: Blood Sugar ranges(Severity) -not checking  Associated Symptoms- Polyuria/phagia/dipsia- no      Visual problems- no Medications: Compliance- daily metformin Hypoglycemic symptoms- no Timing - continuous    L SHOULDER NECK PAIN Worsening lately.  Tender to touch around L neck and diffusely over shoulder.  Worse with movement and when sleeps on it at night.  No specific weakness or loss of sensatoin.  Tylenol and gabapentin help a little.  GOITER Feels this is getting bigger.  Sometimes has coughing spells and feels the left side of her neck is more swollen and tries to swallow on the R side.  No weight loss or night sweats  Chief Complaint noted Review of Symptoms - see HPI PMH - Smoking status noted.    Objective Vital Signs reviewed BP 132/84   Pulse 81   Wt 249 lb (112.9 kg)   SpO2 98%   BMI 39.00 kg/m  Psych:  Cognition and judgment appear intact. Alert, communicative  and cooperative with normal attention span and concentration. No apparent delusions, illusions, hallucinations L Shoulder - nearly FROM but painful arc and with rotator cuff testing diffusely tender to palpation over shoulder no focal deformity Distal strength intact Pulses are equal Neck obvious goiter.  No focal tenderness Bilater cerumen impaction   Assessments/Plans  Neck pain on left side This has waxed and waned over months.  Did not ever get imaging as ordered last visit.  Seems most consistent with a musculoskeletal cause but with history of cancer need to rule out mets.   She was concerned her goiter could be causing this but doubt.  Could be cervical impingement.  Continue multiple analgesics.  May need Physical Therapy   Thyromegaly Worsening by symptoms of cough and swallowing.  Had negative bx.  Will recheck [name] to assess growth.  May need surgical referral   Type 2 diabetes mellitus  without complications ([place]) Stable A1c Discussed that her weight had increased some     See after visit summary for details of patient instructions

## 2019-10-19 NOTE — BH Specialist Note (Signed)
   Care Coordination Follow up Phone Note Social Work   10/19/2019 Name: Jenny [name] MRN: [id number] DOB: 09-Nov-1961  Jenny [name] is a 58 y.o. year old female who sees Chambliss, Jeb Levering, MD for primary care.  Reason for follow-up: joint office visit with PCP today for ongoing assessment of needs and barriers related to mental health treatment.  Patient reports:She has not completed paper work to start [facility] PHP program. Dr. Modesta Messing started her on a new medication but she has not picked it up as of today. Assessment: Patient continues to experience barriers to getting connected to counseling serices.  Patient Recommendations: Patient may benefit from and is in agreement to receive further assessment and therapeutic interventions to assist her with managing previous trauma. Goals: Patient continues to work on same goal of connecting for psychotherapy treatment.  Interventions: Discussed possible barriers and two therapy options that specializes in trauma informed clinical services Other interventions: . Solution-Focused Strategies,   . Problem-solving teaching/coping strategies   Plan:  1. SW will follow up with patient by phone over the next 2 to 3 days 2. Patient will complete paper work and call the agencies discussed today. ( [facility] and [facility])   [name], [place] / [place]   [phone] 1:28 PM

## 2019-10-19 NOTE — Patient Instructions (Addendum)
Good to see you today!  Thanks for coming in.  Your diabetes is doing well  Go get the xrays for the neck and the shoulder on the L  Make sure you are using Aspercreme -  Trolamine salicylate - use 3-4 times a day  Use Tylenol three times a day and Ibuprofen 1-2 tabs twice a day if really severe  Get the [name] for your goiter  I will be in touch about the xrays and [name]   Get Murine Ear Wax system - with wax softner and small bulb syringe   Come back in 3 months for diabetes or if the shoulder is worse come back sooner

## 2019-10-19 NOTE — Assessment & Plan Note (Signed)
Worsening by symptoms of cough and swallowing.  Had negative bx.  Will recheck [name] to assess growth.  May need surgical referral

## 2019-10-20 LAB — TSH: TSH: 1.63 u[IU]/mL (ref 0.450–4.500)

## 2019-10-20 NOTE — Assessment & Plan Note (Signed)
Stable A1c Discussed that her weight had increased some

## 2019-10-22 ENCOUNTER — Ambulatory Visit (HOSPITAL_COMMUNITY)
Admission: RE | Admit: 2019-10-22 | Discharge: 2019-10-22 | Disposition: A | Discharge status: Home / Self Care | Payer: BC Managed Care – PPO | Source: Ambulatory Visit | Attending: Family Medicine | Admitting: Family Medicine

## 2019-10-22 ENCOUNTER — Ambulatory Visit
Admission: RE | Admit: 2019-10-22 | Discharge: 2019-10-22 | Disposition: A | Discharge status: Home / Self Care | Payer: BC Managed Care – PPO | Source: Ambulatory Visit | Attending: Family Medicine | Admitting: Family Medicine

## 2019-10-22 ENCOUNTER — Other Ambulatory Visit: Payer: Self-pay

## 2019-10-22 DIAGNOSIS — M542 Cervicalgia: Secondary | ICD-10-CM

## 2019-10-22 DIAGNOSIS — E010 Iodine-deficiency related diffuse (endemic) goiter: Secondary | ICD-10-CM | POA: Insufficient documentation

## 2019-10-25 ENCOUNTER — Telehealth (HOSPITAL_COMMUNITY): Payer: Self-pay | Admitting: Professional

## 2019-10-25 ENCOUNTER — Telehealth: Payer: Self-pay | Admitting: Family Medicine

## 2019-10-25 DIAGNOSIS — M542 Cervicalgia: Secondary | ICD-10-CM

## 2019-10-25 DIAGNOSIS — E010 Iodine-deficiency related diffuse (endemic) goiter: Secondary | ICD-10-CM

## 2019-10-25 NOTE — Assessment & Plan Note (Signed)
US shows small amount of increased size.  She is not losing weight.  We discussed that if worsens or is bother her a lot we could consider surgery and a swallowng test.  Will discuss further next visit

## 2019-10-25 NOTE — Telephone Encounter (Signed)
Spoke with her about results  See AP under problem list

## 2019-10-25 NOTE — Assessment & Plan Note (Signed)
Cspine and Shoulder films show narrowing and arthritis.  No cancer.  Continue current therapy.  May consider MRI if weakness or worsening symptoms or not slowly improving

## 2019-10-27 ENCOUNTER — Ambulatory Visit (HOSPITAL_COMMUNITY): Payer: BC Managed Care – PPO

## 2019-10-27 ENCOUNTER — Other Ambulatory Visit: Payer: Self-pay | Admitting: Family Medicine

## 2019-10-27 ENCOUNTER — Telehealth: Payer: Self-pay | Admitting: Licensed Clinical Social Worker

## 2019-10-27 NOTE — Telephone Encounter (Signed)
   Care Coordination Follow up Phone Note Social Work   10/27/2019 Name: KHAMANI GOLUBSKI MRN: [id number] DOB: 25-Feb-1961  [name] is a 58 y.o. year old female who sees Chambliss, Jeb Levering, MD for primary care.  Reason for follow-up: phone encounter with patient today for ongoing assessment of needs and barriers with coordinating mental health treatment.  Patient reports:She is doing better. Has spoken to coordinator and completed paper work for [employer] program.   Plan:  1. Patient will keep all up coming appointments  2. Contact LCSW if needed after participating in the [facility] program 3. No further F/U by LCSW at this time.  [name], LCSW Clinical Social Worker [place] / [place]   [phone] 4:24 PM

## 2019-11-02 ENCOUNTER — Telehealth (HOSPITAL_COMMUNITY): Payer: Self-pay | Admitting: Professional

## 2019-11-02 ENCOUNTER — Other Ambulatory Visit: Payer: Self-pay

## 2019-11-02 ENCOUNTER — Other Ambulatory Visit (HOSPITAL_COMMUNITY): Payer: BC Managed Care – PPO | Attending: Psychiatry
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
## 2019-11-09 ENCOUNTER — Telehealth (HOSPITAL_COMMUNITY): Payer: Self-pay | Admitting: Professional

## 2019-11-09 ENCOUNTER — Encounter (HOSPITAL_COMMUNITY): Payer: Self-pay | Admitting: Psychiatry

## 2019-11-09 ENCOUNTER — Other Ambulatory Visit: Payer: Self-pay

## 2019-11-09 ENCOUNTER — Ambulatory Visit (INDEPENDENT_AMBULATORY_CARE_PROVIDER_SITE_OTHER): Payer: BC Managed Care – PPO | Admitting: Psychiatry

## 2019-11-09 DIAGNOSIS — F4310 Post-traumatic stress disorder, unspecified: Secondary | ICD-10-CM | POA: Diagnosis not present

## 2019-11-09 DIAGNOSIS — F331 Major depressive disorder, recurrent, moderate: Secondary | ICD-10-CM | POA: Diagnosis not present

## 2019-11-09 MED ORDER — ARIPIPRAZOLE 5 MG PO TABS: 5.0000 mg | ORAL_TABLET | Freq: Every day | ORAL | 1 refills | Status: DC

## 2019-11-09 NOTE — Patient Instructions (Addendum)
1. Continue sertraline 150 mg daily  2.Continuebupropion 450 mg (300 mg + 150 mg) daily 3. Increase Abilify 5 mg at night  4. Please contact [name] for Partial Hospitalization Program [phone] 5. Next appointment: in one month

## 2019-11-22 ENCOUNTER — Other Ambulatory Visit (HOSPITAL_COMMUNITY): Payer: Self-pay | Admitting: Psychiatry

## 2019-11-22 MED ORDER — ARIPIPRAZOLE 5 MG PO TABS: 5.0000 mg | ORAL_TABLET | Freq: Every day | ORAL | 1 refills | Status: DC

## 2019-12-07 ENCOUNTER — Telehealth: Payer: Self-pay | Admitting: Hematology

## 2019-12-07 NOTE — Telephone Encounter (Signed)
YF Call day 1/25 lab/fu moved 1/27. Left message. Schedule mailed.

## 2019-12-07 NOTE — Telephone Encounter (Signed)
YF Call day 1/25 lab/fu moved 1/27.

## 2019-12-09 ENCOUNTER — Encounter: Payer: Self-pay | Admitting: Family Medicine

## 2019-12-09 ENCOUNTER — Ambulatory Visit (HOSPITAL_COMMUNITY): Payer: BC Managed Care – PPO | Admitting: Psychiatry

## 2019-12-09 LAB — HM DIABETES EYE EXAM

## 2019-12-23 ENCOUNTER — Encounter: Payer: Self-pay | Admitting: Psychiatry

## 2019-12-23 ENCOUNTER — Ambulatory Visit (INDEPENDENT_AMBULATORY_CARE_PROVIDER_SITE_OTHER): Payer: BC Managed Care – PPO | Admitting: Psychiatry

## 2019-12-23 ENCOUNTER — Other Ambulatory Visit: Payer: Self-pay

## 2019-12-23 ENCOUNTER — Telehealth: Payer: Self-pay | Admitting: Psychiatry

## 2019-12-23 DIAGNOSIS — F3341 Major depressive disorder, recurrent, in partial remission: Secondary | ICD-10-CM

## 2019-12-23 DIAGNOSIS — F4310 Post-traumatic stress disorder, unspecified: Secondary | ICD-10-CM | POA: Diagnosis not present

## 2019-12-23 DIAGNOSIS — F331 Major depressive disorder, recurrent, moderate: Secondary | ICD-10-CM | POA: Insufficient documentation

## 2019-12-23 MED ORDER — BUPROPION HCL ER (XL) 300 MG PO TB24: ORAL_TABLET | ORAL | 0 refills | Status: DC

## 2019-12-23 MED ORDER — ARIPIPRAZOLE 5 MG PO TABS: 5.0000 mg | ORAL_TABLET | Freq: Every day | ORAL | 1 refills | Status: DC

## 2019-12-23 MED ORDER — SERTRALINE HCL 100 MG PO TABS: 150.0000 mg | ORAL_TABLET | Freq: Every day | ORAL | 1 refills | Status: DC

## 2019-12-23 MED ORDER — BUPROPION HCL ER (XL) 150 MG PO TB24: ORAL_TABLET | ORAL | 0 refills | Status: DC

## 2019-12-23 NOTE — Telephone Encounter (Signed)
I had referred her to [facility] today as per Dr. Ivor Reining plan and recommendations. I received the following notification from the [facility] counselor/case manager :- This patient has been referred and scheduled for an assessment multiple times. After speaking with her many times, she is not appropriate for a group setting.
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
## 2020-01-12 NOTE — Progress Notes (Signed)
[place]   Telephone:([phone]) [phone] [phone]) [phone]   Clinic Follow up Note   Patient Care Team: Lind Covert, MD as PCP - General Juanita Craver, MD as Consulting Physician (Gastroenterology) [name], MD as Consulting Physician (General Surgery) Truitt Merle, MD as Consulting Physician (Hematology)  Date of Service:  01/19/2020  CHIEF COMPLAINT: F/u of sigmoid colon cancer  SUMMARY OF ONCOLOGIC HISTORY: Oncology History Overview Note  Cancer Staging Cancer of sigmoid colon metastatic to intra-abdominal lymph node [facility]) Staging form: Colon and Rectum, AJCC 7th Edition - Clinical: Stage IIIB (T3, N1a, M0) - Signed by Truitt Merle, MD on 12/28/2014 - Pathologic stage from 11/14/2014: Stage IIIB (T3, N1a, cM0) - Signed by Truitt Merle, MD on 06/29/2018     Cancer of sigmoid colon metastatic to intra-abdominal lymph node ([place])  10/10/2014 Imaging   CT abdomen and pelvis: Eccentric wall thickening along the sigmoid colon, worrisome for early colonic adenocarcinoma. No findings suspicious for metastatic disease. CT chest (-)      11/14/2014 Initial Diagnosis   Colon cancer   11/14/2014 Pathologic Stage   pT3pN1pMx, G2, tumor invading through the muscular propria into pericolonic fatty tissue.  LVI (-), PNI (-). 1/28 node positive, margins negative.    11/14/2014 Surgery   sigmoid colon segmental resection, margins (-).    11/14/2014 Cancer Staging   Staging form: Colon and Rectum, AJCC 7th Edition - Pathologic stage from 11/14/2014: Stage IIIB (T3, N1a, cM0) - Signed by Truitt Merle, MD on 06/29/2018   12/28/2014 - 05/17/2015 Adjuvant Chemotherapy   mFOLFOX6, 10% dose reduction due to neuropathy and cytopenia from cycle 5, oxaliplatin held from cycle 8 due to leg pain and neuropathy. chemo stopped after cycle 10 due to her tolerance issue.    12/29/2015 Imaging   CT chest, abdomen and pelvis with contrast showed no evidence of recurrence.   06/19/2017 Imaging   CT CAPw contrast IMPRESSION: No evidence recurrent or metastatic colon carcinoma.  Stable mild hepatic steatosis.  5.1 cm right thyroid lobe nodule measures larger in size compared to recent studies. Thyroid ultrasound recommended for further evaluation. This recommendation follows ACR consensus guidelines: Managing Incidental Thyroid Nodules Detected on Imaging: White Paper of the ACR Incidental Thyroid Findings Committee. J Am Coll Radiol 2015;12(2):143-150.  Aortic atherosclerosis.   07/10/2017 Pathology Results   Thyroid Biopsy 07/10/17 Diagnosis THYROID, FINE NEEDLE ASPIRATION, RIGHT INFERIOR, RLP (SPECIMEN 1 OF 2 COLLECTED 07-10-2017) CONSISTENT WITH BENIGN FOLLICULAR NODULE ([place] CATEGORY II).    07/10/2017 Imaging   07/10/2017 [name] [facility]NA Thyroid IMPRESSION: 1. Technically successful ultrasound guided fine needle aspiration of right inferior thyroid nodule. 2. Technically successful ultrasound guided fine needle aspiration of left mid thyroid nodule.  07/10/2017 US Thyroid IMPRESSION: 6.3 cm right inferior TR 3 nodule meets criteria for biopsy. 1.8 cm left mid TR 4 nodule also meets criteria for biopsy.   06/22/2018 Imaging   06/22/2018 CT CAP W Contrast IMPRESSION: 1. No findings to suggest locally recurrent disease or metastatic disease in the chest, abdomen or pelvis. 2. Dilatation of the pulmonic trunk (3.7 cm in diameter), concerning for pulmonary arterial hypertension. 3. Persistent and increasing mass-like enlargement of the right lobe of the thyroid gland. Correlation with results of prior thyroid ultrasound and biopsy is recommended.   07/30/2019 Imaging   CT AP  IMPRESSION: 1. No acute process in the abdomen or pelvis. Possible constipation. 2. Surgical sutures in the sigmoid. No findings of recurrent or metastatic cancer. 3. Hepatic steatosis and hepatomegaly.  4.  Aortic Atherosclerosis (ICD10-I70.0).        CURRENT THERAPY:  Surveillance   INTERVAL HISTORY:  Jenny [name] is here for a follow up sigmoid colon cancer. She was last seen by me 6 months ago. She presents to the clinic alone. She notes she is doing fairly well. She notes another physician have been concerned about her enlarged thyroid. Her PCP has not really looked into. She also notes left shoulder an arm pain in certain positions, like nerve pain.  She notes her abdominal changes have improved. She has been trying to drink better liquids. She still has constipation/diarrhea.     REVIEW OF SYSTEMS:   Constitutional: Denies fevers, chills or abnormal weight loss Eyes: Denies blurriness of vision Ears, nose, mouth, throat, and face: Denies mucositis or sore throat (+) Enlarged thyroid, coughing fits Respiratory: Denies dyspnea or wheezes Cardiovascular: Denies palpitation, chest discomfort or lower extremity swelling Gastrointestinal:  Denies nausea, heartburn (+) Constipation/diarrhea, incontinence  Skin: Denies abnormal skin rashes MSK: (+) Left shoulder and arm pain Lymphatics: Denies new lymphadenopathy or easy bruising Neurological:Denies numbness, tingling or new weaknesses Behavioral/Psych: Mood is stable, no new changes  All other systems were reviewed with the patient and are negative.  MEDICAL HISTORY:  Past Medical History:  Diagnosis Date  . Anemia   . Cancer (HCC)    colon cancer   . Enlarged thyroid     SURGICAL HISTORY: Past Surgical History:  Procedure Laterality Date  . BOWEL RESECTION    . CERVICAL SPINE SURGERY  06/17/2012   C5-C7 ACDF  . CESAREAN SECTION    . PARTIAL HYSTERECTOMY    . PORT-A-CATH REMOVAL Left 07/28/2015   Procedure: REMOVAL PORT-A-CATH;  Surgeon: Alicia Thomas, MD;  Location: [facility];  Service: General;  Laterality: Left;  . PORTACATH PLACEMENT Left 12/22/2014   Procedure: INSERTION PORT-A-CATH LEFT SUBCLAVIAN;  Surgeon: Alicia Thomas, MD;  Location: [facility];  Service: General;  Laterality: Left;  . TONSILLECTOMY       I have reviewed the social history and family history with the patient and they are unchanged from previous note.  ALLERGIES:  is allergic to other and shellfish allergy.  MEDICATIONS:  Current Outpatient Medications  Medication Sig Dispense Refill  . acetaminophen (TYLENOL) 500 MG tablet Take 500 mg by mouth every 6 (six) hours as needed.    . ARIPiprazole (ABILIFY) 5 MG tablet Take 1 tablet (5 mg total) by mouth at bedtime. 90 tablet 1  . buPROPion (WELLBUTRIN XL) 150 MG 24 hr tablet Take 2 tablets (300 mg total) by mouth daily. 180 tablet 1  . buPROPion (WELLBUTRIN XL) 150 MG 24 hr tablet Take total of 450 mg daily (300 mg + 150 mg) 90 tablet 0  . buPROPion (WELLBUTRIN XL) 300 MG 24 hr tablet Take total of 450 mg daily (300 mg + 150 mg) 90 tablet 0  . fluticasone (FLONASE) 50 MCG/ACT nasal spray Place 2 sprays into both nostrils daily. 16 g 2  . gabapentin (NEURONTIN) 300 MG capsule Take 2-3 capsules before bed 270 capsule 1  . hydrocortisone cream 0.5 % Apply 1 application topically 2 (two) times daily as needed for itching.    . loratadine (CLARITIN) 10 MG tablet Take 10 mg by mouth daily. AS NEEDED    . metFORMIN (GLUCOPHAGE) 1000 MG tablet Take 1 tablet (1,000 mg total) by mouth 2 (two) times daily with a meal. 60 tablet 1  . metFORMIN (GLUMETZA) 1000 MG (MOD) 24 hr tablet   Take 1 tablet (1,000 mg total) by mouth 2 (two) times daily with a meal. 180 tablet 1  . Multiple Vitamin (MULTIVITAMIN) tablet Take 1 tablet by mouth daily.    . sertraline (ZOLOFT) 100 MG tablet Take 1.5 tablets (150 mg total) by mouth daily. 135 tablet 1   No current facility-administered medications for this visit.    PHYSICAL EXAMINATION: ECOG PERFORMANCE STATUS: 2 - Symptomatic, <50% confined to bed  Vitals:   01/19/20 1015  BP: 127/85  Pulse: 97  Resp: 16  Temp: 98 F (36.7 C)  SpO2: 99%   Filed Weights   01/19/20 1015  Weight: 256 lb 12.8 oz (116.5 kg)    GENERAL:alert, no distress and  comfortable SKIN: skin color, texture, turgor are normal, no rashes or significant lesions EYES: normal, Conjunctiva are pink and non-injected, sclera clear  NECK: supple, non-tender, without nodularity (+) Enlarged Thyroid  LYMPH:  no palpable lymphadenopathy in the cervical, axillary  LUNGS: clear to auscultation and percussion with normal breathing effort HEART: regular rate & rhythm and no murmurs and no lower extremity edema ABDOMEN:abdomen soft, non-tender and normal bowel sounds Musculoskeletal:no cyanosis of digits and no clubbing  NEURO: alert & oriented x 3 with fluent speech, no focal motor/sensory deficits  LABORATORY DATA:  I have reviewed the data as listed CBC Latest Ref Rng & Units 01/19/2020 07/16/2019 02/08/2019  WBC 4.0 - 10.5 K/uL 5.8 6.0 6.6  Hemoglobin 12.0 - 15.0 g/dL 12.8 12.1 11.6(L)  Hematocrit 36.0 - 46.0 % 40.6 38.5 36.9  Platelets 150 - 400 K/uL 262 258 306     CMP Latest Ref Rng & Units 01/19/2020 07/16/2019 02/08/2019  Glucose 70 - 99 mg/dL 211(H) 82 164(H)  BUN 6 - 20 mg/dL _0 Creatinine 0.44 - 1.00 mg/dL 0.87 0.92 0.85  Sodium 135 - 145 mmol/L 140 141 140  Potassium 3.5 - 5.1 mmol/L 4.4 4.2 3.9  Chloride 98 - 111 mmol/L 107 107 104  CO2 22 - 32 mmol/L _1 Calcium 8.9 - 10.3 mg/dL 9.4 9.8 10.0  Total Protein 6.5 - 8.1 g/dL 7.7 7.9 7.7  Total Bilirubin 0.3 - 1.2 mg/dL 0.4 0.3 0.3  Alkaline Phos 38 - 126 U/L 66 69 73  AST 15 - 41 U/L 19 17 12(L)  ALT 0 - 44 U/L _2 RADIOGRAPHIC STUDIES: I have personally reviewed the radiological images as listed and agreed with the findings in the report. No results found.   ASSESSMENT & PLAN:  Jenny [name] is a 59 y.o. female with    1. Sigmoid colon adenocarcinoma, G2, pT3pN1aM0, stage IIIB, MSI stable -She is was diagnosed in 10/2014. She is s/p sigmoid colon resection and adjuvant chemo mFOLFOXfor 6 months  -From a colon cancer standpoint she is clinically doing well. Labs  reviewed, CBC and CMP WNL except BG 211. Physical exam unremarkable except enlarged thyroid. There is no concern for recurrence.  -She is over 5 years from diagnosis her risk for recurrence at this point is minimal. She is fine to continue surveillance with her PCP. She is agreeable. She will f/u with me as needed in the future.    2.Peripheral neuropathy, b/l leg and body pain, secondary to chemo, G2-3  -She has developed significant peripheral neuropathy after chemotherapy, with bilateral leg and body pain, numbness and tingling of her fingers and toes. -She has previously completed physical therapy and will continue exercise  -Given the  possible drug interaction with Zoloft she did not try Cymbalta. She takes Gabapentin 300mg mainly at night. She is also on Wellbutrin.  -This is still uncontrolled in hands and feet. I discussed this has been ongoing and likely permanent.. I offered referral to Dr. Vaslow, she declined. I recommend she continue to exercise.   3. Obesity and Hepatic steatosis -I encouraged her to follow-up with her PCP and check her lipid profile at least once a year. She has mild fatty liver on past scan.  -Iagainencouraged her to lose weight,by diet control and exercise. Weight stable   4. Diabetes -Only on Metformin,Monitored by PCP -Her HbA1c improved to 7.5 in 05/2019 and improved to 7.4 in 09/2019.  -I encouraged her to watch her diet carefully and avoid fast foods and soft drinks.   5. Depressionand anxiety -Continue onZoloftas recently prescribed by PCP as well as Wellbutrin -She notes she tries to fight depression but she remains anxious and nervous from her incontinence and neuropathy pain.   6. ?Pulmonary hypertension -Her CT scan showed dilated pulmonary artery, concerning for pulmonary hypertension. -she saw Dr. Alva.Her 08/20/18 Sleep study was negative for sleep apnea  7. Stool incontinence, Intermittent Diarrhea /Constipation  -She has  diffuse abdominal tenderness. -Her diarrhea/constipation and incontinence still occurs.  -She previously completed PT for pelvic floor. She will continue practicing at home.  -She is due for colonoscopy in 2021 with Dr Mann  8. Multinodular goiter  -Seen on previfous US, last was stable in 10/22/19. Previously biopsied  -she has not seen endocrinologist or surgeon  -She also feels this leads to coughing fits and choking and difficulty swallowing. I recommend she see a surgeon and consider surgery. She is agreeable.  -She has had neck surgery in the past, around 2013 at [facility]. She notes lately having left shoulder and arm pain in certain position. Xray of neck in 09/2019.    Plan -Referral to general surgeon  -I refilled Gabapentin today  -F/u with me as needed in the future, she will f/u with PCP    No problem-specific Assessment & Plan notes found for this encounter.   Orders Placed This Encounter  Procedures  . Ambulatory referral to General Surgery    Referral Priority:   Routine    Referral Type:   Surgical    Referral Reason:   Specialty Services Required    Requested Specialty:   General Surgery    Number of Visits Requested:   1   All questions were answered. The patient knows to call the clinic with any problems, questions or concerns. No barriers to learning was detected. The total time spent in the appointment was 25 minutes.      , MD 01/19/2020   I, Amoya Bennett, am acting as scribe for  , MD.   I have reviewed the above documentation for accuracy and completeness, and I agree with the above.       

## 2020-01-17 ENCOUNTER — Ambulatory Visit: Payer: BC Managed Care – PPO | Admitting: Hematology

## 2020-01-17 ENCOUNTER — Other Ambulatory Visit: Payer: BC Managed Care – PPO

## 2020-01-19 ENCOUNTER — Encounter: Payer: Self-pay | Admitting: Hematology

## 2020-01-19 ENCOUNTER — Inpatient Hospital Stay (HOSPITAL_BASED_OUTPATIENT_CLINIC_OR_DEPARTMENT_OTHER): Payer: No Typology Code available for payment source | Admitting: Hematology

## 2020-01-19 ENCOUNTER — Inpatient Hospital Stay: Payer: No Typology Code available for payment source | Attending: Hematology

## 2020-01-19 ENCOUNTER — Other Ambulatory Visit: Payer: Self-pay

## 2020-01-19 VITALS — BP 127/85 | HR 97 | Temp 98.0°F | Resp 16 | Ht 67.0 in | Wt 256.8 lb

## 2020-01-19 DIAGNOSIS — R1310 Dysphagia, unspecified: Secondary | ICD-10-CM | POA: Insufficient documentation

## 2020-01-19 DIAGNOSIS — R05 Cough: Secondary | ICD-10-CM | POA: Diagnosis not present

## 2020-01-19 DIAGNOSIS — R10819 Abdominal tenderness, unspecified site: Secondary | ICD-10-CM | POA: Insufficient documentation

## 2020-01-19 DIAGNOSIS — R197 Diarrhea, unspecified: Secondary | ICD-10-CM | POA: Diagnosis not present

## 2020-01-19 DIAGNOSIS — E1142 Type 2 diabetes mellitus with diabetic polyneuropathy: Secondary | ICD-10-CM | POA: Diagnosis not present

## 2020-01-19 DIAGNOSIS — I700 Atherosclerosis of aorta: Secondary | ICD-10-CM | POA: Insufficient documentation

## 2020-01-19 DIAGNOSIS — R159 Full incontinence of feces: Secondary | ICD-10-CM | POA: Insufficient documentation

## 2020-01-19 DIAGNOSIS — Z7984 Long term (current) use of oral hypoglycemic drugs: Secondary | ICD-10-CM | POA: Diagnosis not present

## 2020-01-19 DIAGNOSIS — R160 Hepatomegaly, not elsewhere classified: Secondary | ICD-10-CM | POA: Diagnosis not present

## 2020-01-19 DIAGNOSIS — E669 Obesity, unspecified: Secondary | ICD-10-CM | POA: Insufficient documentation

## 2020-01-19 DIAGNOSIS — C187 Malignant neoplasm of sigmoid colon: Secondary | ICD-10-CM

## 2020-01-19 DIAGNOSIS — M25512 Pain in left shoulder: Secondary | ICD-10-CM | POA: Diagnosis not present

## 2020-01-19 DIAGNOSIS — Z79899 Other long term (current) drug therapy: Secondary | ICD-10-CM | POA: Insufficient documentation

## 2020-01-19 DIAGNOSIS — T451X5A Adverse effect of antineoplastic and immunosuppressive drugs, initial encounter: Secondary | ICD-10-CM

## 2020-01-19 DIAGNOSIS — E049 Nontoxic goiter, unspecified: Secondary | ICD-10-CM | POA: Diagnosis not present

## 2020-01-19 DIAGNOSIS — F329 Major depressive disorder, single episode, unspecified: Secondary | ICD-10-CM | POA: Insufficient documentation

## 2020-01-19 DIAGNOSIS — M79602 Pain in left arm: Secondary | ICD-10-CM | POA: Diagnosis not present

## 2020-01-19 DIAGNOSIS — K5900 Constipation, unspecified: Secondary | ICD-10-CM | POA: Insufficient documentation

## 2020-01-19 DIAGNOSIS — F419 Anxiety disorder, unspecified: Secondary | ICD-10-CM | POA: Insufficient documentation

## 2020-01-19 DIAGNOSIS — C772 Secondary and unspecified malignant neoplasm of intra-abdominal lymph nodes: Secondary | ICD-10-CM

## 2020-01-19 DIAGNOSIS — G620 Drug-induced polyneuropathy: Secondary | ICD-10-CM

## 2020-01-19 DIAGNOSIS — R634 Abnormal weight loss: Secondary | ICD-10-CM | POA: Diagnosis not present

## 2020-01-19 DIAGNOSIS — K760 Fatty (change of) liver, not elsewhere classified: Secondary | ICD-10-CM | POA: Diagnosis not present

## 2020-01-19 DIAGNOSIS — E042 Nontoxic multinodular goiter: Secondary | ICD-10-CM | POA: Insufficient documentation

## 2020-01-19 LAB — CBC WITH DIFFERENTIAL/PLATELET
Abs Immature Granulocytes: 0.02 10*3/uL (ref 0.00–0.07)
Basophils Absolute: 0 10*3/uL (ref 0.0–0.1)
Basophils Relative: 1 %
Eosinophils Absolute: 0.2 10*3/uL (ref 0.0–0.5)
Eosinophils Relative: 3 %
HCT: 40.6 % (ref 36.0–46.0)
Hemoglobin: 12.8 g/dL (ref 12.0–15.0)
Immature Granulocytes: 0 %
Lymphocytes Relative: 35 %
Lymphs Abs: 2 10*3/uL (ref 0.7–4.0)
MCH: 25.9 pg — ABNORMAL LOW (ref 26.0–34.0)
MCHC: 31.5 g/dL (ref 30.0–36.0)
MCV: 82 fL (ref 80.0–100.0)
Monocytes Absolute: 0.3 10*3/uL (ref 0.1–1.0)
Monocytes Relative: 5 %
Neutro Abs: 3.3 10*3/uL (ref 1.7–7.7)
Neutrophils Relative %: 56 %
Platelets: 262 10*3/uL (ref 150–400)
RBC: 4.95 MIL/uL (ref 3.87–5.11)
RDW: 16.1 % — ABNORMAL HIGH (ref 11.5–15.5)
WBC: 5.8 10*3/uL (ref 4.0–10.5)
nRBC: 0 % (ref 0.0–0.2)

## 2020-01-19 LAB — COMPREHENSIVE METABOLIC PANEL
ALT: 26 U/L (ref 0–44)
AST: 19 U/L (ref 15–41)
Albumin: 4.3 g/dL (ref 3.5–5.0)
Alkaline Phosphatase: 66 U/L (ref 38–126)
Anion gap: 9 (ref 5–15)
BUN: 15 mg/dL (ref 6–20)
CO2: 24 mmol/L (ref 22–32)
Calcium: 9.4 mg/dL (ref 8.9–10.3)
Chloride: 107 mmol/L (ref 98–111)
Creatinine, Ser: 0.87 mg/dL (ref 0.44–1.00)
GFR calc Af Amer: >60 mL/min (ref >60)
GFR calc non Af Amer: >60 mL/min (ref >60)
Glucose, Bld: 211 mg/dL — ABNORMAL HIGH (ref 70–99)
Potassium: 4.4 mmol/L (ref 3.5–5.1)
Sodium: 140 mmol/L (ref 135–145)
Total Bilirubin: 0.4 mg/dL (ref 0.3–1.2)
Total Protein: 7.7 g/dL (ref 6.5–8.1)

## 2020-01-19 MED ORDER — GABAPENTIN 300 MG PO CAPS: ORAL_CAPSULE | ORAL | 1 refills | Status: DC

## 2020-01-20 ENCOUNTER — Telehealth: Payer: Self-pay | Admitting: Hematology

## 2020-01-20 NOTE — Telephone Encounter (Signed)
F/u as needed per 1/27 los

## 2020-02-09 ENCOUNTER — Telehealth: Payer: Self-pay | Admitting: Family Medicine

## 2020-02-09 NOTE — Telephone Encounter (Signed)
Left VM to call us and leave times and number when we could chat
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
## 2020-02-16 NOTE — Telephone Encounter (Signed)
Spoke with her Doing ok but having problems swallowing Had referral placed by Dr Margit Banda but has not heard anything Asked her to call [facility] to check Asked her to make an appointment to check her diabetes  She agrees

## 2020-02-21 ENCOUNTER — Ambulatory Visit (INDEPENDENT_AMBULATORY_CARE_PROVIDER_SITE_OTHER): Payer: BC Managed Care – PPO | Admitting: Psychiatry

## 2020-02-21 ENCOUNTER — Encounter (HOSPITAL_COMMUNITY): Payer: Self-pay | Admitting: Psychiatry

## 2020-02-21 ENCOUNTER — Other Ambulatory Visit: Payer: Self-pay

## 2020-02-21 DIAGNOSIS — F4310 Post-traumatic stress disorder, unspecified: Secondary | ICD-10-CM | POA: Diagnosis not present

## 2020-02-21 DIAGNOSIS — F331 Major depressive disorder, recurrent, moderate: Secondary | ICD-10-CM

## 2020-02-21 DIAGNOSIS — F3341 Major depressive disorder, recurrent, in partial remission: Secondary | ICD-10-CM

## 2020-02-21 MED ORDER — BUPROPION HCL ER (XL) 150 MG PO TB24: ORAL_TABLET | ORAL | 0 refills | Status: DC

## 2020-02-21 MED ORDER — BUPROPION HCL ER (XL) 300 MG PO TB24: ORAL_TABLET | ORAL | 0 refills | Status: DC

## 2020-02-21 NOTE — Patient Instructions (Signed)
1. Continue sertraline 150 mg daily  2.Continuebupropion 450 mg (300 mg + 150 mg) daily 3. Increase Abilify 5 mg at night  4. Next appointment: 4/19 at 3:30

## 2020-03-03 ENCOUNTER — Ambulatory Visit: Payer: Self-pay | Admitting: Licensed Clinical Social Worker

## 2020-03-03 ENCOUNTER — Other Ambulatory Visit: Payer: Self-pay | Admitting: Family Medicine

## 2020-03-03 DIAGNOSIS — Z1231 Encounter for screening mammogram for malignant neoplasm of breast: Secondary | ICD-10-CM

## 2020-03-03 NOTE — Chronic Care Management (AMB) (Signed)
   Clinical Social Work  Care Management referral   03/03/2020 Name: Jenny [name] MRN: [id number] DOB: 12/11/1961  Jenny [name] is a 59 y.o. year old female who is a primary care patient of Chambliss, Jeb Levering, MD . LCSW received message from patient requesting a return call.  LCSW returned phone call to [facility], patient has questions about upcoming appointment with therapist she was referred to by Dr. Modesta Messing.  Patient unable to remember the name, date or time of the appointment.  Requested assistance.  LCSW unable to identify any upcoming therapy appointments for patient.  Patient continues to experience difficulty with remembering things and is ambient about going to counseling   Recommendation: Patient may benefit from, and is in agreement to utilize a calendar to keep up with appointments and write things down Intervention:   Provided patient with information about importance of therapy in conjunction with medication   Advised patient to contact Dr. Ivor Reining office to get information about therapist appointment   Provided brief Motivational Interviewing and solution focused interventions   Review of patient status, including review of consultants reports, relevant laboratory and other test results, and collaboration with appropriate care team members and the patient's provider was performed as part of comprehensive patient evaluation and provision of care management services.    Plan: No further follow up required by [place], [place] / [place]   [id number] 10:34 AM

## 2020-03-06 ENCOUNTER — Other Ambulatory Visit: Payer: Self-pay

## 2020-03-06 ENCOUNTER — Ambulatory Visit (INDEPENDENT_AMBULATORY_CARE_PROVIDER_SITE_OTHER): Payer: BC Managed Care – PPO | Admitting: Psychiatry

## 2020-03-06 ENCOUNTER — Encounter (HOSPITAL_COMMUNITY): Payer: Self-pay | Admitting: Psychiatry

## 2020-03-06 DIAGNOSIS — F4310 Post-traumatic stress disorder, unspecified: Secondary | ICD-10-CM | POA: Diagnosis not present

## 2020-03-06 DIAGNOSIS — F331 Major depressive disorder, recurrent, moderate: Secondary | ICD-10-CM
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
## 2020-03-08 ENCOUNTER — Other Ambulatory Visit (HOSPITAL_COMMUNITY): Payer: Self-pay

## 2020-03-08 ENCOUNTER — Ambulatory Visit (INDEPENDENT_AMBULATORY_CARE_PROVIDER_SITE_OTHER): Payer: BC Managed Care – PPO | Admitting: Family Medicine

## 2020-03-08 ENCOUNTER — Encounter: Payer: Self-pay | Admitting: Family Medicine

## 2020-03-08 ENCOUNTER — Other Ambulatory Visit: Payer: Self-pay

## 2020-03-08 VITALS — BP 150/84 | HR 77 | Wt 255.6 lb

## 2020-03-08 DIAGNOSIS — M542 Cervicalgia: Secondary | ICD-10-CM

## 2020-03-08 DIAGNOSIS — E119 Type 2 diabetes mellitus without complications: Secondary | ICD-10-CM

## 2020-03-08 DIAGNOSIS — R1310 Dysphagia, unspecified: Secondary | ICD-10-CM | POA: Diagnosis not present

## 2020-03-08 DIAGNOSIS — T17308A Unspecified foreign body in larynx causing other injury, initial encounter: Secondary | ICD-10-CM | POA: Insufficient documentation

## 2020-03-08 LAB — POCT GLYCOSYLATED HEMOGLOBIN (HGB A1C): HbA1c, POC (controlled diabetic range): 8.5 % — AB (ref 0.0–7.0)

## 2020-03-08 MED ORDER — DICLOFENAC SODIUM 1 % EX GEL: 2.0000 g | Freq: Four times a day (QID) | CUTANEOUS | 4 refills | Status: DC

## 2020-03-08 MED ORDER — LORATADINE 10 MG PO TABS: 10.0000 mg | ORAL_TABLET | Freq: Every day | ORAL | 1 refills | Status: AC

## 2020-03-08 NOTE — Assessment & Plan Note (Signed)
This could be due to her goiter but her pain complaints in the L neck make this less clear.  Will order a swallow study

## 2020-03-08 NOTE — Progress Notes (Signed)
    SUBJECTIVE:   CHIEF COMPLAINT / HPI:   NECK PAIN Complains of L sided pain from lower neck to mid upper chest to medial shoulder area.  Worse when moves.   No weakness in L arm.  She feels may be due to her goiter  SWALLOWING Reports frequent coughing choking.  No weight loss.    DIABETES Disease Monitoring: Blood Sugar ranges(Severity) -not checking  Associated Symptoms- Polyuria/phagia/dipsia- no      Visual problems- no Medications: Compliance- taking metformin in AM but often misses PM doses Hypoglycemic symptoms- no Timing - continuous      PERTINENT  PMH / PSH: Colon cancer in remission  OBJECTIVE:   BP (!) 150/84   Pulse 77   Wt 255 lb 9.6 oz (115.9 kg)   SpO2 98%   BMI 40.03 kg/m   NECK - has obvious goiter that is tender on left side is also tender below goiter and along clavicle. Good ROM without much pain in L shoulder Lungs:  Normal respiratory effort, chest expands symmetrically. Lungs are clear to auscultation, no crackles or wheezes. No wheezing or stridor heard when moves her neck around  Heart - Regular rate and rhythm.  No murmurs, gallops or rubs.      ASSESSMENT/PLAN:   Swallowing difficulty This could be due to her goiter but her pain complaints in the L neck make this less clear.  Will order a swallow study   Neck pain on left side Worsened.  Unsure of exact cause.  Does have significant musculoskeletal arthritis on neck and L shoulder films.  Do not think has chronic thyroiditis.  Will treat possible arthritis with regular tylenol and diclofenac gel   Type 2 diabetes mellitus without complications ([place]) Not at goal.  Discussed and a flozin.  She would like to try to cut back on sweets and taking her metformin regularly      Lind Covert, [place]

## 2020-03-08 NOTE — Patient Instructions (Addendum)
Good to see you today!  Thanks for coming in.  For the Swallowing - we will order a swallowing study   For the neck pain - take tylenol 2 tabs every 8 hours and use the diclofenac arthritis gel 4x a day  For the diabetes  Stop sweets and take metformin twice a day every day   I will be in contact about the swallowing study  Come back 3 months for a diabetes test

## 2020-03-08 NOTE — Assessment & Plan Note (Signed)
Worsened.  Unsure of exact cause.  Does have significant musculoskeletal arthritis on neck and L shoulder films.  Do not think has chronic thyroiditis.  Will treat possible arthritis with regular tylenol and diclofenac gel

## 2020-03-08 NOTE — Assessment & Plan Note (Signed)
Not at goal.  Discussed and a flozin.  She would like to try to cut back on sweets and taking her metformin regularly

## 2020-03-16 ENCOUNTER — Ambulatory Visit (HOSPITAL_COMMUNITY)
Admission: RE | Admit: 2020-03-16 | Discharge: 2020-03-16 | Disposition: A | Discharge status: Home / Self Care | Payer: No Typology Code available for payment source | Source: Ambulatory Visit | Attending: Family Medicine | Admitting: Family Medicine

## 2020-03-16 ENCOUNTER — Other Ambulatory Visit: Payer: Self-pay

## 2020-03-16 DIAGNOSIS — R1310 Dysphagia, unspecified: Secondary | ICD-10-CM | POA: Diagnosis not present

## 2020-03-16 NOTE — Progress Notes (Signed)
Objective Swallowing Evaluation: Type of Study: MBS-Modified Barium Swallow Study   Patient Details  Name: Jenny [name] MRN: [id number] Date of Birth: [birth year]  Today's Date: 03/16/2020 Time: SLP Start Time (ACUTE ONLY): 1100 -SLP Stop Time (ACUTE ONLY): 1130  SLP Time Calculation (min) (ACUTE ONLY): 30 min   Past Medical History:  Past Medical History:  Diagnosis Date  . Anemia   . Cancer [facility])    colon cancer   . Enlarged thyroid    Past Surgical History:  Past Surgical History:  Procedure Laterality Date  . BOWEL RESECTION    . CERVICAL SPINE SURGERY  06/17/2012   C5-C7 ACDF  . CESAREAN SECTION    . PARTIAL HYSTERECTOMY    . PORT-A-CATH REMOVAL Left 07/28/2015   Procedure: REMOVAL PORT-A-CATH;  Surgeon: [name], MD;  Location: [facility];  Service: General;  Laterality: Left;  . PORTACATH PLACEMENT Left 12/22/2014   Procedure: INSERTION PORT-A-CATH LEFT SUBCLAVIAN;  Surgeon: [name], MD;  Location: [facility];  Service: General;  Laterality: Left;  . TONSILLECTOMY     HPI: Pt arrives for an OP MBS due to complaint of painful swallowing, globus, excessive mucous. Pt has a history of ACDF approx 5-7 years ago per her report. She says she had mild difficulty swallowing previously, but it has worsened in the last few months.    No data recorded   Assessment / Plan / Recommendation  CHL IP CLINICAL IMPRESSIONS 03/16/2020  Clinical Impression Pt demonstrates normal swallow function, but endorsed globus and irratation throughout. Biofeedback provided to demonstrate no residual food or liquid in the pharynx or esophagus. Suggest f/u to identify potential for GER exacerbating symptoms. No SLP f/u needed at this time.    SLP Visit Diagnosis Dysphagia, unspecified (R13.10)  Attention and concentration deficit following --  Frontal lobe and executive function deficit following --  Impact on safety and function Mild aspiration risk      CHL IP TREATMENT RECOMMENDATION  03/16/2020  Treatment Recommendations No treatment recommended at this time     No flowsheet data found.  CHL IP DIET RECOMMENDATION 03/16/2020  SLP Diet Recommendations Regular solids;Thin liquid  Liquid Administration via Cup;Straw  Medication Administration Whole meds with liquid  Compensations --  Postural Changes Seated upright at 90 degrees      CHL IP OTHER RECOMMENDATIONS 03/16/2020  Recommended Consults Consider GI evaluation  Oral Care Recommendations --  Other Recommendations --      CHL IP FOLLOW UP RECOMMENDATIONS 03/16/2020  Follow up Recommendations None      No flowsheet data found.         CHL IP ORAL PHASE 03/16/2020  Oral Phase WFL  Oral - Pudding Teaspoon --  Oral - Pudding Cup --  Oral - Honey Teaspoon --  Oral - Honey Cup --  Oral - Nectar Teaspoon --  Oral - Nectar Cup --  Oral - Nectar Straw --  Oral - Thin Teaspoon --  Oral - Thin Cup --  Oral - Thin Straw --  Oral - Puree --  Oral - Mech Soft --  Oral - Regular --  Oral - Multi-Consistency --  Oral - Pill --  Oral Phase - Comment --    CHL IP PHARYNGEAL PHASE 03/16/2020  Pharyngeal Phase WFL  Pharyngeal- Pudding Teaspoon --  Pharyngeal --  Pharyngeal- Pudding Cup --  Pharyngeal --  Pharyngeal- Honey Teaspoon --  Pharyngeal --  Pharyngeal- Honey Cup --  Pharyngeal --  Pharyngeal- Nectar Teaspoon --  Pharyngeal --  Pharyngeal- Nectar Cup --  Pharyngeal --  Pharyngeal- Nectar Straw --  Pharyngeal --  Pharyngeal- Thin Teaspoon --  Pharyngeal --  Pharyngeal- Thin Cup --  Pharyngeal --  Pharyngeal- Thin Straw --  Pharyngeal --  Pharyngeal- Puree --  Pharyngeal --  Pharyngeal- Mechanical Soft --  Pharyngeal --  Pharyngeal- Regular --  Pharyngeal --  Pharyngeal- Multi-consistency --  Pharyngeal --  Pharyngeal- Pill --  Pharyngeal --  Pharyngeal Comment --     No flowsheet data found.  Herbie Baltimore, MA CCC-SLP  Acute Rehabilitation Services Pager [phone] Office  [phone]  [name] 03/16/2020, 2:37 PM

## 2020-03-21 ENCOUNTER — Ambulatory Visit (INDEPENDENT_AMBULATORY_CARE_PROVIDER_SITE_OTHER): Payer: BC Managed Care – PPO | Admitting: Psychiatry

## 2020-03-21 ENCOUNTER — Other Ambulatory Visit: Payer: Self-pay

## 2020-03-21 DIAGNOSIS — F4310 Post-traumatic stress disorder, unspecified: Secondary | ICD-10-CM

## 2020-03-21 DIAGNOSIS — F331 Major depressive disorder, recurrent, moderate: Secondary | ICD-10-CM | POA: Diagnosis not present
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
## 2020-03-30 ENCOUNTER — Ambulatory Visit: Payer: BC Managed Care – PPO

## 2020-04-04 ENCOUNTER — Other Ambulatory Visit: Payer: Self-pay

## 2020-04-04 ENCOUNTER — Ambulatory Visit (INDEPENDENT_AMBULATORY_CARE_PROVIDER_SITE_OTHER): Payer: BC Managed Care – PPO | Admitting: Psychiatry

## 2020-04-04 DIAGNOSIS — F4310 Post-traumatic stress disorder, unspecified: Secondary | ICD-10-CM

## 2020-04-04 DIAGNOSIS — F331 Major depressive disorder, recurrent, moderate: Secondary | ICD-10-CM | POA: Diagnosis not present
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
## 2020-04-10 ENCOUNTER — Encounter (HOSPITAL_COMMUNITY): Payer: Self-pay | Admitting: Psychiatry

## 2020-04-10 ENCOUNTER — Ambulatory Visit (INDEPENDENT_AMBULATORY_CARE_PROVIDER_SITE_OTHER): Payer: BC Managed Care – PPO | Admitting: Psychiatry

## 2020-04-10 ENCOUNTER — Ambulatory Visit (HOSPITAL_COMMUNITY): Payer: BC Managed Care – PPO | Admitting: Licensed Clinical Social Worker

## 2020-04-10 ENCOUNTER — Other Ambulatory Visit: Payer: Self-pay

## 2020-04-10 DIAGNOSIS — G4700 Insomnia, unspecified: Secondary | ICD-10-CM | POA: Diagnosis not present

## 2020-04-10 DIAGNOSIS — F4310 Post-traumatic stress disorder, unspecified: Secondary | ICD-10-CM | POA: Diagnosis not present

## 2020-04-10 DIAGNOSIS — F331 Major depressive disorder, recurrent, moderate: Secondary | ICD-10-CM | POA: Diagnosis not present

## 2020-04-10 MED ORDER — TRAZODONE HCL 50 MG PO TABS: ORAL_TABLET | ORAL | 0 refills | Status: DC

## 2020-04-10 NOTE — Patient Instructions (Signed)
1. Continue sertraline 150 mg daily  2.Continuebupropion 450 mg (300 mg + 150 mg) daily 3.ContinueAbilify 5mg  at night  4. Start Trazodone 25-50 mg at night as needed for sleep 5. Next appointment: 5/18 at 2:50

## 2020-04-11 ENCOUNTER — Telehealth (INDEPENDENT_AMBULATORY_CARE_PROVIDER_SITE_OTHER): Payer: BC Managed Care – PPO | Admitting: Psychiatry

## 2020-04-11 ENCOUNTER — Other Ambulatory Visit: Payer: Self-pay

## 2020-04-11 DIAGNOSIS — F331 Major depressive disorder, recurrent, moderate: Secondary | ICD-10-CM

## 2020-04-11 DIAGNOSIS — F4310 Post-traumatic stress disorder, unspecified: Secondary | ICD-10-CM
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
## 2020-04-17 ENCOUNTER — Ambulatory Visit (HOSPITAL_COMMUNITY): Payer: BC Managed Care – PPO | Admitting: Licensed Clinical Social Worker

## 2020-04-17 ENCOUNTER — Other Ambulatory Visit: Payer: Self-pay

## 2020-04-19 ENCOUNTER — Telehealth: Payer: Self-pay

## 2020-04-19 NOTE — Telephone Encounter (Signed)
Patient calls nurse line regarding scab on left leg. Patient is unsure how injury happened and states that she has been picking at area. Patient declines redness, warmth, fever, or drainage. Patient states that area appears to be scabbing over again. Advised patient not to pick at area. Patient had already scheduled with PCP on 04/25/20 for follow up. Instructed patient to keep appointment and to call back and reschedule for the following reasons:  if she notices warmth, redness, purulent drainage, fever or increased pain.   Patient verbalizes understanding.    FYI to PCP.  Talbot Grumbling, RN

## 2020-04-25 ENCOUNTER — Ambulatory Visit: Payer: BC Managed Care – PPO | Admitting: Family Medicine

## 2020-05-02 ENCOUNTER — Other Ambulatory Visit: Payer: Self-pay

## 2020-05-02 ENCOUNTER — Ambulatory Visit (INDEPENDENT_AMBULATORY_CARE_PROVIDER_SITE_OTHER): Payer: BC Managed Care – PPO | Admitting: Psychiatry

## 2020-05-02 DIAGNOSIS — F4310 Post-traumatic stress disorder, unspecified: Secondary | ICD-10-CM | POA: Diagnosis not present
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
## 2020-05-09 ENCOUNTER — Telehealth (INDEPENDENT_AMBULATORY_CARE_PROVIDER_SITE_OTHER): Payer: BC Managed Care – PPO | Admitting: Psychiatry

## 2020-05-09 ENCOUNTER — Encounter (HOSPITAL_COMMUNITY): Payer: Self-pay | Admitting: Psychiatry

## 2020-05-09 ENCOUNTER — Other Ambulatory Visit: Payer: Self-pay

## 2020-05-09 DIAGNOSIS — F4310 Post-traumatic stress disorder, unspecified: Secondary | ICD-10-CM

## 2020-05-09 DIAGNOSIS — F331 Major depressive disorder, recurrent, moderate: Secondary | ICD-10-CM | POA: Diagnosis not present

## 2020-05-09 DIAGNOSIS — G4700 Insomnia, unspecified: Secondary | ICD-10-CM | POA: Diagnosis not present

## 2020-05-09 MED ORDER — BUPROPION HCL ER (XL) 150 MG PO TB24: ORAL_TABLET | ORAL | 0 refills | Status: DC

## 2020-05-09 MED ORDER — ARIPIPRAZOLE (SENSOR) 10 MG PO TABS: 10.0000 mg | ORAL_TABLET | Freq: Every day | ORAL | 0 refills | Status: DC

## 2020-05-09 MED ORDER — TRAZODONE HCL 50 MG PO TABS: 50.0000 mg | ORAL_TABLET | Freq: Every evening | ORAL | 0 refills | Status: DC | PRN

## 2020-05-09 MED ORDER — BUPROPION HCL ER (XL) 300 MG PO TB24: ORAL_TABLET | ORAL | 0 refills | Status: DC

## 2020-05-09 MED ORDER — SERTRALINE HCL 100 MG PO TABS: 150.0000 mg | ORAL_TABLET | Freq: Every day | ORAL | 0 refills | Status: DC

## 2020-05-23 ENCOUNTER — Telehealth (HOSPITAL_COMMUNITY): Payer: Self-pay | Admitting: Psychiatry

## 2020-05-23 ENCOUNTER — Ambulatory Visit (HOSPITAL_COMMUNITY): Payer: BC Managed Care – PPO | Admitting: Psychiatry

## 2020-05-23 ENCOUNTER — Other Ambulatory Visit: Payer: Self-pay

## 2020-05-23 NOTE — Telephone Encounter (Signed)
Therapist attempted to contact patient via text twice through [place], no response. Therapist called patient, left message indicating attempt and requesting patient call office.

## 2020-05-31 ENCOUNTER — Ambulatory Visit (INDEPENDENT_AMBULATORY_CARE_PROVIDER_SITE_OTHER): Payer: BC Managed Care – PPO | Admitting: Family Medicine

## 2020-05-31 ENCOUNTER — Encounter: Payer: Self-pay | Admitting: Family Medicine

## 2020-05-31 ENCOUNTER — Other Ambulatory Visit: Payer: Self-pay

## 2020-05-31 VITALS — BP 143/80 | HR 81 | Ht 63.0 in | Wt 262.2 lb

## 2020-05-31 DIAGNOSIS — R1310 Dysphagia, unspecified: Secondary | ICD-10-CM | POA: Diagnosis not present

## 2020-05-31 DIAGNOSIS — E119 Type 2 diabetes mellitus without complications: Secondary | ICD-10-CM | POA: Diagnosis not present

## 2020-05-31 DIAGNOSIS — Z6841 Body Mass Index (BMI) 40.0 and over, adult: Secondary | ICD-10-CM

## 2020-05-31 LAB — POCT GLYCOSYLATED HEMOGLOBIN (HGB A1C): HbA1c, POC (controlled diabetic range): 8.5 % — AB (ref 0.0–7.0)

## 2020-05-31 MED ORDER — EMPAGLIFLOZIN 10 MG PO TABS: 10.0000 mg | ORAL_TABLET | Freq: Every day | ORAL | 3 refills | Status: DC

## 2020-05-31 NOTE — Assessment & Plan Note (Signed)
Worsening.  Hopefully Jardiance may help.   See after visit summary

## 2020-05-31 NOTE — Assessment & Plan Note (Signed)
Subjectively present but swallow study was normal and is gaining weight.  No further work up unless causing increased symptoms

## 2020-05-31 NOTE — Progress Notes (Signed)
Patient given PHQ2 and 9.   Provider aware.  .Congetta Odriscoll R Tycho Cheramie, CMA  

## 2020-05-31 NOTE — Assessment & Plan Note (Signed)
Unchanged.  Add Jardiance.  Continue to work on weight loss

## 2020-05-31 NOTE — Progress Notes (Signed)
    SUBJECTIVE:   CHIEF COMPLAINT / HPI:   DIABETES Taking metformin regularly 100 mg twice a day with only rare forgetting  SWALLOWING Still has episodic problems swallowing but no pain or vomiting and is gaining weight Swallow study was normal  WEIGHT GAIN Gained 20 lbs. Associated with stress eating.  Feels this is causing fatigue and shortness of breath   PERTINENT  PMH / PSH: seeing psychiatry and counseling regularly. Feels is slowly improving.  No suicidal ideation   OBJECTIVE:   BP (!) 143/80   Pulse 81   Ht 5\' 3"  (1.6 m)   Wt 262 lb 3.2 oz (118.9 kg)   SpO2 96%   BMI 46.45 kg/m   Heart - Regular rate and rhythm.  No murmurs, gallops or rubs.    Lungs:  Normal respiratory effort, chest expands symmetrically. Lungs are clear to auscultation, no crackles or wheezes. Extremities:  No cyanosis, edema, or deformity noted with good range of motion of all major joints.   No swallowing or speech difficulties   ASSESSMENT/PLAN:   Swallowing difficulty Subjectively present but swallow study was normal and is gaining weight.  No further work up unless causing increased symptoms   Obesity Worsening.  Hopefully Jardiance may help.   See after visit summary   Type 2 diabetes mellitus without complications (HCC) Unchanged.  Add Jardiance.  Continue to work on [employer] loss      Lind Covert, MD [place]

## 2020-05-31 NOTE — Patient Instructions (Addendum)
Good to see you today!  Thanks for coming in.  For the Diabetes  Keep taking the metformin   Will start Jardiance 10 mg a day  Come back in 3 mon to check A1c  For the Weight  Will try weight loss with diet and Jardiance  If not making progress consider bariatrics

## 2020-06-13 ENCOUNTER — Ambulatory Visit (HOSPITAL_COMMUNITY): Payer: BC Managed Care – PPO | Admitting: Psychiatry

## 2020-06-15 ENCOUNTER — Other Ambulatory Visit: Payer: Self-pay

## 2020-06-15 ENCOUNTER — Ambulatory Visit (INDEPENDENT_AMBULATORY_CARE_PROVIDER_SITE_OTHER): Payer: BC Managed Care – PPO | Admitting: Psychiatry

## 2020-06-15 DIAGNOSIS — F331 Major depressive disorder, recurrent, moderate: Secondary | ICD-10-CM | POA: Diagnosis not present

## 2020-06-15 DIAGNOSIS — F4310 Post-traumatic stress disorder, unspecified: Secondary | ICD-10-CM | POA: Diagnosis not present
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
## 2020-06-27 ENCOUNTER — Telehealth (INDEPENDENT_AMBULATORY_CARE_PROVIDER_SITE_OTHER): Payer: BC Managed Care – PPO | Admitting: Psychiatry

## 2020-06-27 ENCOUNTER — Other Ambulatory Visit: Payer: Self-pay

## 2020-06-27 ENCOUNTER — Encounter (HOSPITAL_COMMUNITY): Payer: Self-pay | Admitting: Psychiatry

## 2020-06-27 DIAGNOSIS — F4310 Post-traumatic stress disorder, unspecified: Secondary | ICD-10-CM

## 2020-06-27 DIAGNOSIS — F331 Major depressive disorder, recurrent, moderate: Secondary | ICD-10-CM | POA: Diagnosis not present

## 2020-06-27 MED ORDER — ARIPIPRAZOLE 15 MG PO TABS: 15.0000 mg | ORAL_TABLET | Freq: Every day | ORAL | 1 refills | Status: DC

## 2020-06-27 NOTE — Patient Instructions (Signed)
1. Continue sertraline 150 mg daily  2.Continuebupropion 450 mg (300 mg + 150 mg) daily 3.IncreaseAbilify 15mg  at night 4. Continue Trazodone 50 mg at night as needed for sleep 5. Next appointment:8/17 at 2:30

## 2020-07-05 ENCOUNTER — Other Ambulatory Visit: Payer: Self-pay

## 2020-07-05 ENCOUNTER — Ambulatory Visit (HOSPITAL_COMMUNITY): Payer: BC Managed Care – PPO | Admitting: Psychiatry

## 2020-07-18 ENCOUNTER — Ambulatory Visit (HOSPITAL_COMMUNITY): Payer: BC Managed Care – PPO | Admitting: Psychiatry

## 2020-08-01 ENCOUNTER — Other Ambulatory Visit: Payer: Self-pay

## 2020-08-01 ENCOUNTER — Ambulatory Visit (INDEPENDENT_AMBULATORY_CARE_PROVIDER_SITE_OTHER): Payer: BC Managed Care – PPO | Admitting: Psychiatry

## 2020-08-01 DIAGNOSIS — F331 Major depressive disorder, recurrent, moderate: Secondary | ICD-10-CM

## 2020-08-01 DIAGNOSIS — F4310 Post-traumatic stress disorder, unspecified: Secondary | ICD-10-CM | POA: Diagnosis not present
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
## 2020-08-08 ENCOUNTER — Other Ambulatory Visit: Payer: Self-pay | Admitting: Family Medicine

## 2020-08-08 ENCOUNTER — Encounter (HOSPITAL_COMMUNITY): Payer: Self-pay | Admitting: Psychiatry

## 2020-08-08 ENCOUNTER — Other Ambulatory Visit: Payer: Self-pay

## 2020-08-08 ENCOUNTER — Telehealth (INDEPENDENT_AMBULATORY_CARE_PROVIDER_SITE_OTHER): Payer: BC Managed Care – PPO | Admitting: Psychiatry

## 2020-08-08 DIAGNOSIS — G4700 Insomnia, unspecified: Secondary | ICD-10-CM | POA: Diagnosis not present

## 2020-08-08 DIAGNOSIS — F331 Major depressive disorder, recurrent, moderate: Secondary | ICD-10-CM | POA: Diagnosis not present

## 2020-08-08 DIAGNOSIS — F4310 Post-traumatic stress disorder, unspecified: Secondary | ICD-10-CM | POA: Diagnosis not present

## 2020-08-08 MED ORDER — ARIPIPRAZOLE 15 MG PO TABS: 15.0000 mg | ORAL_TABLET | Freq: Every day | ORAL | 1 refills | Status: DC

## 2020-08-08 MED ORDER — TRAZODONE HCL 50 MG PO TABS: 50.0000 mg | ORAL_TABLET | Freq: Every evening | ORAL | 0 refills | Status: DC | PRN

## 2020-08-08 MED ORDER — BUPROPION HCL ER (XL) 300 MG PO TB24: ORAL_TABLET | ORAL | 0 refills | Status: DC

## 2020-08-08 MED ORDER — SERTRALINE HCL 100 MG PO TABS: 150.0000 mg | ORAL_TABLET | Freq: Every day | ORAL | 0 refills | Status: DC

## 2020-08-08 MED ORDER — PRAZOSIN HCL 2 MG PO CAPS: ORAL_CAPSULE | ORAL | 1 refills | Status: DC

## 2020-08-08 MED ORDER — PRAZOSIN HCL 1 MG PO CAPS: ORAL_CAPSULE | ORAL | 0 refills | Status: DC

## 2020-08-08 MED ORDER — BUPROPION HCL ER (XL) 150 MG PO TB24: ORAL_TABLET | ORAL | 0 refills | Status: DC

## 2020-08-08 NOTE — Patient Instructions (Signed)
1. Continue sertraline 150 mg daily  2.Continuebupropion 450 mg (300 mg + 150 mg) daily 3.IncreaseAbilify15mg  at night 4. Start prazosin 1 mg at night for 3 days, then 2 mg at night  4.ContinueTrazodone 50 mg at night as needed for sleep 5. Next appointment:9/30 at 2:30
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
## 2020-08-15 ENCOUNTER — Ambulatory Visit (HOSPITAL_COMMUNITY): Payer: BC Managed Care – PPO | Admitting: Psychiatry

## 2020-08-15 ENCOUNTER — Other Ambulatory Visit: Payer: Self-pay

## 2020-08-15 ENCOUNTER — Telehealth (HOSPITAL_COMMUNITY): Payer: Self-pay | Admitting: *Deleted

## 2020-08-15 NOTE — Telephone Encounter (Signed)
LMOM informing patient that her appt for today is being cancelled and for patient to call office to resch appt

## 2020-08-29 ENCOUNTER — Ambulatory Visit (INDEPENDENT_AMBULATORY_CARE_PROVIDER_SITE_OTHER): Payer: BC Managed Care – PPO | Admitting: Psychiatry

## 2020-08-29 ENCOUNTER — Other Ambulatory Visit: Payer: Self-pay

## 2020-08-29 DIAGNOSIS — F331 Major depressive disorder, recurrent, moderate: Secondary | ICD-10-CM | POA: Diagnosis not present

## 2020-08-29 DIAGNOSIS — F4310 Post-traumatic stress disorder, unspecified: Secondary | ICD-10-CM | POA: Diagnosis not present
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
## 2020-09-12 ENCOUNTER — Other Ambulatory Visit (HOSPITAL_COMMUNITY): Payer: Self-pay | Admitting: Psychiatry

## 2020-09-12 MED ORDER — PRAZOSIN HCL 2 MG PO CAPS: ORAL_CAPSULE | ORAL | 1 refills | Status: DC

## 2020-09-14 ENCOUNTER — Ambulatory Visit (INDEPENDENT_AMBULATORY_CARE_PROVIDER_SITE_OTHER): Payer: BC Managed Care – PPO | Admitting: Psychiatry

## 2020-09-14 ENCOUNTER — Other Ambulatory Visit: Payer: Self-pay

## 2020-09-14 DIAGNOSIS — F331 Major depressive disorder, recurrent, moderate: Secondary | ICD-10-CM | POA: Diagnosis not present

## 2020-09-14 DIAGNOSIS — F4310 Post-traumatic stress disorder, unspecified: Secondary | ICD-10-CM
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
## 2020-09-21 ENCOUNTER — Telehealth (HOSPITAL_COMMUNITY): Payer: BC Managed Care – PPO | Admitting: Psychiatry

## 2020-09-27 ENCOUNTER — Other Ambulatory Visit: Payer: Self-pay

## 2020-09-27 ENCOUNTER — Encounter: Payer: Self-pay | Admitting: Family Medicine

## 2020-09-27 ENCOUNTER — Ambulatory Visit (INDEPENDENT_AMBULATORY_CARE_PROVIDER_SITE_OTHER): Payer: BC Managed Care – PPO | Admitting: Family Medicine

## 2020-09-27 VITALS — BP 128/80 | Ht 64.0 in | Wt 240.2 lb

## 2020-09-27 DIAGNOSIS — R1310 Dysphagia, unspecified: Secondary | ICD-10-CM | POA: Diagnosis not present

## 2020-09-27 DIAGNOSIS — Z23 Encounter for immunization: Secondary | ICD-10-CM | POA: Diagnosis not present

## 2020-09-27 DIAGNOSIS — E119 Type 2 diabetes mellitus without complications: Secondary | ICD-10-CM | POA: Diagnosis not present

## 2020-09-27 LAB — POCT GLYCOSYLATED HEMOGLOBIN (HGB A1C): HbA1c, POC (controlled diabetic range): 8.1 % — AB (ref 0.0–7.0)

## 2020-09-27 NOTE — Assessment & Plan Note (Signed)
Improving by symptoms.  Weight loss attributable to behavior change

## 2020-09-27 NOTE — Assessment & Plan Note (Signed)
Improved A1c.  Continue current medications

## 2020-09-27 NOTE — Patient Instructions (Addendum)
Good to see you today!  Thanks for coming in.  Great work on losing [employer]!    Keep taking medications as you are  Keep walking  Check with your insurance about prevention tests   You need a mammogram to prevent breast cancer.  Please schedule an appointment.  You can call [phone].    You need a colonoscopy to prevent colon cancer.  Talk with Dr Collene Mares.  Come back in 3 months

## 2020-09-27 NOTE — Progress Notes (Signed)
  DIABETES Taking metformin regularly 500 mg twice a day and Jardiance once daily with only rare forgetting.  Walking semiregularly with her friend  SWALLOWING Feels this is improved.   WEIGHT GAIN Lost 20 lb!  Feels this is due to feeling better from mental health standpoint and exercise.    PERTINENT  PMH / PSH: seeing psychiatry and counseling regularly. Is now on abilify and Prazosin   OBJECTIVE:   BP 128/80   Ht 5\' 4"  (1.626 m)   Wt 240 lb 3.2 oz (109 kg)   SpO2 100%   BMI 41.23 kg/m   Mobility:able to get up and down from exam table without assistance or distress Heart - Regular rate and rhythm.  No murmurs, gallops or rubs.    Lungs:  Normal respiratory effort, chest expands symmetrically. Lungs are clear to auscultation, no crackles or wheezes. Extremities:  No cyanosis, edema, or deformity noted with good range of motion of all major joints.   Diabetic Foot Check -  Appearance - no lesions, ulcers or calluses Skin - no unusual pallor or redness   ASSESSMENT/PLAN:   Swallowing difficulty Improving by symptoms.  Weight loss attributable to behavior change   Type 2 diabetes mellitus without complications (HCC) Improved A1c.  Continue current medications      Lind Covert, MD [place]

## 2020-09-28 ENCOUNTER — Ambulatory Visit (INDEPENDENT_AMBULATORY_CARE_PROVIDER_SITE_OTHER): Payer: BC Managed Care – PPO | Admitting: Psychiatry

## 2020-09-28 DIAGNOSIS — F331 Major depressive disorder, recurrent, moderate: Secondary | ICD-10-CM

## 2020-09-28 DIAGNOSIS — F4310 Post-traumatic stress disorder, unspecified: Secondary | ICD-10-CM | POA: Diagnosis not present
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
## 2020-10-07 ENCOUNTER — Other Ambulatory Visit (HOSPITAL_COMMUNITY): Payer: Self-pay | Admitting: Psychiatry
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
## 2020-10-12 ENCOUNTER — Other Ambulatory Visit: Payer: Self-pay

## 2020-10-12 ENCOUNTER — Ambulatory Visit (INDEPENDENT_AMBULATORY_CARE_PROVIDER_SITE_OTHER): Payer: BC Managed Care – PPO | Admitting: Psychiatry

## 2020-10-12 ENCOUNTER — Ambulatory Visit (HOSPITAL_COMMUNITY): Payer: BC Managed Care – PPO | Admitting: Psychiatry

## 2020-10-12 DIAGNOSIS — F4310 Post-traumatic stress disorder, unspecified: Secondary | ICD-10-CM | POA: Diagnosis not present

## 2020-10-12 DIAGNOSIS — F331 Major depressive disorder, recurrent, moderate: Secondary | ICD-10-CM
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
## 2020-10-13 ENCOUNTER — Telehealth (INDEPENDENT_AMBULATORY_CARE_PROVIDER_SITE_OTHER): Payer: BC Managed Care – PPO | Admitting: Psychiatry

## 2020-10-13 ENCOUNTER — Encounter (HOSPITAL_COMMUNITY): Payer: Self-pay | Admitting: Psychiatry

## 2020-10-13 ENCOUNTER — Other Ambulatory Visit: Payer: Self-pay

## 2020-10-13 DIAGNOSIS — F331 Major depressive disorder, recurrent, moderate: Secondary | ICD-10-CM

## 2020-10-13 DIAGNOSIS — F4310 Post-traumatic stress disorder, unspecified: Secondary | ICD-10-CM | POA: Diagnosis not present

## 2020-10-13 MED ORDER — PRAZOSIN HCL 2 MG PO CAPS
2.0000 mg | ORAL_CAPSULE | Freq: Every day | ORAL | 0 refills | Status: DC
Start: 2020-11-10 — End: 2021-01-18

## 2020-10-13 MED ORDER — BUPROPION HCL ER (XL) 300 MG PO TB24
ORAL_TABLET | ORAL | 0 refills | Status: DC
Start: 2020-11-06 — End: 2021-01-18

## 2020-10-13 MED ORDER — BUPROPION HCL ER (XL) 150 MG PO TB24: ORAL_TABLET | ORAL | 0 refills | Status: DC

## 2020-10-13 MED ORDER — ARIPIPRAZOLE 15 MG PO TABS
15.0000 mg | ORAL_TABLET | Freq: Every day | ORAL | 1 refills | Status: DC
Start: 2020-10-13 — End: 2020-11-24

## 2020-10-13 MED ORDER — TRAZODONE HCL 50 MG PO TABS: 50.0000 mg | ORAL_TABLET | Freq: Every evening | ORAL | 0 refills | Status: DC | PRN

## 2020-10-13 MED ORDER — SERTRALINE HCL 100 MG PO TABS: 200.0000 mg | ORAL_TABLET | Freq: Every day | ORAL | 0 refills | Status: DC

## 2020-10-13 NOTE — Patient Instructions (Signed)
1. Increase sertraline 200 mg daily  2.Continuebupropion 450 mg (300 mg + 150 mg) daily 3.ContinueAbilify15mg  at night 4. Continue prazosin 2 mg at night 4.ContinueTrazodone 50 mg at night as needed for sleep 5. Next appointment:12/3 at 10:30

## 2020-10-18 ENCOUNTER — Other Ambulatory Visit: Payer: Self-pay

## 2020-10-18 ENCOUNTER — Ambulatory Visit (INDEPENDENT_AMBULATORY_CARE_PROVIDER_SITE_OTHER): Payer: BC Managed Care – PPO

## 2020-10-18 DIAGNOSIS — Z23 Encounter for immunization: Secondary | ICD-10-CM

## 2020-10-18 NOTE — Progress Notes (Signed)
   Covid-19 Vaccination Clinic  Name:  Jenny [name]    MRN: [id number] DOB: 1961-01-06  10/18/2020  [name] was observed post Covid-19 immunization for 15 minutes without incident. She was provided with Vaccine Information Sheet and instruction to access the V-Safe system.   [name] was instructed to call 911 with any severe reactions post vaccine: Marland Kitchen Difficulty breathing  . Swelling of face and throat  . A fast heartbeat  . A bad rash all over body  . Dizziness and weakness   #2 Covid Vaccine administered LD without complication.

## 2020-10-26 ENCOUNTER — Ambulatory Visit (INDEPENDENT_AMBULATORY_CARE_PROVIDER_SITE_OTHER): Payer: BC Managed Care – PPO | Admitting: Psychiatry

## 2020-10-26 ENCOUNTER — Other Ambulatory Visit: Payer: Self-pay

## 2020-10-26 DIAGNOSIS — F4310 Post-traumatic stress disorder, unspecified: Secondary | ICD-10-CM

## 2020-10-26 DIAGNOSIS — F331 Major depressive disorder, recurrent, moderate: Secondary | ICD-10-CM | POA: Diagnosis not present
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
## 2020-11-07 ENCOUNTER — Other Ambulatory Visit: Payer: Self-pay

## 2020-11-07 ENCOUNTER — Other Ambulatory Visit (HOSPITAL_COMMUNITY): Payer: Self-pay | Admitting: Psychiatry

## 2020-11-07 ENCOUNTER — Ambulatory Visit (INDEPENDENT_AMBULATORY_CARE_PROVIDER_SITE_OTHER): Payer: BC Managed Care – PPO | Admitting: Psychiatry

## 2020-11-07 DIAGNOSIS — F4310 Post-traumatic stress disorder, unspecified: Secondary | ICD-10-CM | POA: Diagnosis not present

## 2020-11-07 DIAGNOSIS — F331 Major depressive disorder, recurrent, moderate: Secondary | ICD-10-CM
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
## 2020-11-21 ENCOUNTER — Other Ambulatory Visit: Payer: Self-pay

## 2020-11-21 ENCOUNTER — Ambulatory Visit (INDEPENDENT_AMBULATORY_CARE_PROVIDER_SITE_OTHER): Payer: BC Managed Care – PPO | Admitting: Psychiatry

## 2020-11-21 DIAGNOSIS — F331 Major depressive disorder, recurrent, moderate: Secondary | ICD-10-CM

## 2020-11-21 DIAGNOSIS — F4310 Post-traumatic stress disorder, unspecified: Secondary | ICD-10-CM | POA: Diagnosis not present
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
## 2020-11-22 MED ORDER — EMPAGLIFLOZIN 10 MG PO TABS
10.0000 mg | ORAL_TABLET | Freq: Every day | ORAL | 3 refills | Status: DC
Start: 2020-11-22 — End: 2022-01-14

## 2020-11-24 ENCOUNTER — Telehealth (INDEPENDENT_AMBULATORY_CARE_PROVIDER_SITE_OTHER): Payer: BC Managed Care – PPO | Admitting: Psychiatry

## 2020-11-24 ENCOUNTER — Encounter: Payer: Self-pay | Admitting: Psychiatry

## 2020-11-24 ENCOUNTER — Telehealth (HOSPITAL_COMMUNITY): Payer: BC Managed Care – PPO | Admitting: Psychiatry

## 2020-11-24 ENCOUNTER — Other Ambulatory Visit: Payer: Self-pay

## 2020-11-24 DIAGNOSIS — F331 Major depressive disorder, recurrent, moderate: Secondary | ICD-10-CM

## 2020-11-24 DIAGNOSIS — G4700 Insomnia, unspecified: Secondary | ICD-10-CM | POA: Diagnosis not present

## 2020-11-24 DIAGNOSIS — F4310 Post-traumatic stress disorder, unspecified: Secondary | ICD-10-CM | POA: Diagnosis not present

## 2020-11-24 MED ORDER — ARIPIPRAZOLE 15 MG PO TABS
15.0000 mg | ORAL_TABLET | Freq: Every day | ORAL | 0 refills | Status: DC
Start: 2020-12-12 — End: 2021-02-15

## 2020-11-24 MED ORDER — SERTRALINE HCL 100 MG PO TABS: 200.0000 mg | ORAL_TABLET | Freq: Every day | ORAL | 0 refills | Status: DC

## 2020-12-05 ENCOUNTER — Ambulatory Visit (INDEPENDENT_AMBULATORY_CARE_PROVIDER_SITE_OTHER): Payer: BC Managed Care – PPO | Admitting: Psychiatry

## 2020-12-05 ENCOUNTER — Other Ambulatory Visit: Payer: Self-pay

## 2020-12-05 DIAGNOSIS — F331 Major depressive disorder, recurrent, moderate: Secondary | ICD-10-CM | POA: Diagnosis not present

## 2020-12-05 DIAGNOSIS — F4310 Post-traumatic stress disorder, unspecified: Secondary | ICD-10-CM
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
## 2020-12-26 ENCOUNTER — Ambulatory Visit (INDEPENDENT_AMBULATORY_CARE_PROVIDER_SITE_OTHER): Payer: BC Managed Care – PPO | Admitting: Family Medicine

## 2020-12-26 ENCOUNTER — Ambulatory Visit (INDEPENDENT_AMBULATORY_CARE_PROVIDER_SITE_OTHER): Payer: BC Managed Care – PPO | Admitting: Psychiatry

## 2020-12-26 ENCOUNTER — Other Ambulatory Visit: Payer: Self-pay

## 2020-12-26 VITALS — BP 122/84 | HR 86 | Resp 20 | Wt 249.0 lb

## 2020-12-26 DIAGNOSIS — F4323 Adjustment disorder with mixed anxiety and depressed mood: Secondary | ICD-10-CM | POA: Diagnosis not present

## 2020-12-26 DIAGNOSIS — F331 Major depressive disorder, recurrent, moderate: Secondary | ICD-10-CM | POA: Diagnosis not present

## 2020-12-26 DIAGNOSIS — E119 Type 2 diabetes mellitus without complications: Secondary | ICD-10-CM

## 2020-12-26 DIAGNOSIS — F4310 Post-traumatic stress disorder, unspecified: Secondary | ICD-10-CM | POA: Diagnosis not present

## 2020-12-26 LAB — POCT GLYCOSYLATED HEMOGLOBIN (HGB A1C): Hemoglobin A1C: 8.1 % — AB (ref 4.0–5.6)

## 2020-12-26 NOTE — Assessment & Plan Note (Signed)
Stable not quite at goal.  Will increase metformin to 1000 twice a day and encourage walking

## 2020-12-26 NOTE — Progress Notes (Signed)
    SUBJECTIVE:   CHIEF COMPLAINT / HPI:   DIABETES Taking metformin regularly 500 mg twice a day and Jardiance once daily with only rare forgetting.  Not walking regularly  DEPRESSION Marked 1 on number 9 of PHQ9.  This is not new for her.  Sees a counselor regularly.  Has appointment this afternoon.  Does not have any active plan.  Did have an attempt with medications many years ago.  Taking all her psychiatric medications per the medication list  WEIGHT GAIN   Gained 9 lbs.  Has not been walking.  Ate a little too much over the holidays   PERTINENT  PMH / PSH: Her husband had covid and is improving  OBJECTIVE:   BP 122/84   Pulse 86   Resp 20   Wt 249 lb (112.9 kg)   BMI 42.74 kg/m   Heart - Regular rate and rhythm.  No murmurs, gallops or rubs.    Psych:  Cognition and judgment appear intact. Alert, communicative  and cooperative with normal attention span and concentration. No apparent delusions, illusions, hallucinations   ASSESSMENT/PLAN:   Type 2 diabetes mellitus without complications (HCC) Stable not quite at goal.  Will increase metformin to 1000 twice a day and encourage walking   Adjustment disorder with mixed anxiety and depressed mood This seems stable.  She is seeing psychiatry and counseling regularly      Carney Living, MD [facility]

## 2020-12-26 NOTE — Patient Instructions (Addendum)
Good to see you today!  Thanks for coming in.  Consider regular walking - put it into action  Increase the metformin to 2 tabs twice a day    Let me know if any problems   You need a mammogram to prevent breast cancer.  Please schedule an appointment.  You can call [phone].    Come back in 3 months
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
## 2020-12-26 NOTE — Assessment & Plan Note (Signed)
This seems stable.  She is seeing psychiatry and counseling regularly
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
## 2021-01-09 ENCOUNTER — Other Ambulatory Visit: Payer: Self-pay

## 2021-01-09 ENCOUNTER — Ambulatory Visit (HOSPITAL_COMMUNITY): Payer: No Typology Code available for payment source | Admitting: Psychiatry

## 2021-01-12 ENCOUNTER — Telehealth: Payer: Self-pay | Admitting: Psychiatry

## 2021-01-12 ENCOUNTER — Telehealth: Payer: BC Managed Care – PPO | Admitting: Psychiatry

## 2021-01-12 ENCOUNTER — Other Ambulatory Visit: Payer: Self-pay

## 2021-01-12 NOTE — Telephone Encounter (Signed)
Sent link for video visit through [place]. Patient did not sign in. Called the patient for appointment scheduled today. The patient did not answer the phone. Voice message was full.
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
## 2021-01-18 ENCOUNTER — Other Ambulatory Visit: Payer: Self-pay

## 2021-01-18 ENCOUNTER — Encounter: Payer: Self-pay | Admitting: Psychiatry

## 2021-01-18 ENCOUNTER — Telehealth (INDEPENDENT_AMBULATORY_CARE_PROVIDER_SITE_OTHER): Payer: Self-pay | Admitting: Psychiatry

## 2021-01-18 DIAGNOSIS — F4310 Post-traumatic stress disorder, unspecified: Secondary | ICD-10-CM

## 2021-01-18 DIAGNOSIS — F331 Major depressive disorder, recurrent, moderate: Secondary | ICD-10-CM

## 2021-01-18 DIAGNOSIS — G4700 Insomnia, unspecified: Secondary | ICD-10-CM

## 2021-01-18 MED ORDER — BUPROPION HCL ER (XL) 150 MG PO TB24: ORAL_TABLET | ORAL | 0 refills | Status: DC

## 2021-01-18 MED ORDER — BUPROPION HCL ER (XL) 300 MG PO TB24
ORAL_TABLET | ORAL | 0 refills | Status: DC
Start: 2021-02-06 — End: 2021-04-23

## 2021-01-18 MED ORDER — PRAZOSIN HCL 2 MG PO CAPS
2.0000 mg | ORAL_CAPSULE | Freq: Every day | ORAL | 0 refills | Status: DC
Start: 2021-01-18 — End: 2021-03-22

## 2021-01-18 NOTE — Patient Instructions (Signed)
1.Continuesertraline 200 mg daily  2.Continuebupropion 450 mg (300 mg + 150 mg) daily 3.Continue Abilify 15 mg daily  5.reinitiate prazosin2 mg at night- monitor dizziness 6.ContinueTrazodone 50 mg at night as needed for sleep 7. Next appointment:2/24 at 11:20

## 2021-01-23 ENCOUNTER — Ambulatory Visit (HOSPITAL_COMMUNITY): Payer: BC Managed Care – PPO | Admitting: Psychiatry

## 2021-02-06 ENCOUNTER — Ambulatory Visit (INDEPENDENT_AMBULATORY_CARE_PROVIDER_SITE_OTHER): Payer: BC Managed Care – PPO | Admitting: Psychiatry

## 2021-02-06 ENCOUNTER — Other Ambulatory Visit: Payer: Self-pay

## 2021-02-06 DIAGNOSIS — F331 Major depressive disorder, recurrent, moderate: Secondary | ICD-10-CM | POA: Diagnosis not present

## 2021-02-06 DIAGNOSIS — F4310 Post-traumatic stress disorder, unspecified: Secondary | ICD-10-CM | POA: Diagnosis not present
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
## 2021-02-15 ENCOUNTER — Telehealth (INDEPENDENT_AMBULATORY_CARE_PROVIDER_SITE_OTHER): Payer: BC Managed Care – PPO | Admitting: Psychiatry

## 2021-02-15 ENCOUNTER — Encounter: Payer: Self-pay | Admitting: Psychiatry

## 2021-02-15 ENCOUNTER — Other Ambulatory Visit: Payer: Self-pay

## 2021-02-15 DIAGNOSIS — F331 Major depressive disorder, recurrent, moderate: Secondary | ICD-10-CM

## 2021-02-15 DIAGNOSIS — F4310 Post-traumatic stress disorder, unspecified: Secondary | ICD-10-CM | POA: Diagnosis not present

## 2021-02-15 DIAGNOSIS — G4700 Insomnia, unspecified: Secondary | ICD-10-CM | POA: Diagnosis not present

## 2021-02-15 MED ORDER — VENLAFAXINE HCL ER 75 MG PO CP24: 75.0000 mg | ORAL_CAPSULE | Freq: Every day | ORAL | 0 refills | Status: DC

## 2021-02-15 MED ORDER — TRAZODONE HCL 50 MG PO TABS: 50.0000 mg | ORAL_TABLET | Freq: Every evening | ORAL | 0 refills | Status: DC | PRN

## 2021-02-15 MED ORDER — VENLAFAXINE HCL ER 150 MG PO CP24: ORAL_CAPSULE | ORAL | 1 refills | Status: DC

## 2021-02-15 MED ORDER — ARIPIPRAZOLE 15 MG PO TABS
15.0000 mg | ORAL_TABLET | Freq: Every day | ORAL | 0 refills | Status: DC
Start: 2021-03-12 — End: 2021-05-25

## 2021-02-20 ENCOUNTER — Other Ambulatory Visit: Payer: Self-pay

## 2021-02-20 ENCOUNTER — Ambulatory Visit (INDEPENDENT_AMBULATORY_CARE_PROVIDER_SITE_OTHER): Payer: BC Managed Care – PPO | Admitting: Psychiatry

## 2021-02-20 DIAGNOSIS — F331 Major depressive disorder, recurrent, moderate: Secondary | ICD-10-CM

## 2021-02-20 DIAGNOSIS — F4310 Post-traumatic stress disorder, unspecified: Secondary | ICD-10-CM
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
## 2021-03-06 ENCOUNTER — Ambulatory Visit (INDEPENDENT_AMBULATORY_CARE_PROVIDER_SITE_OTHER): Payer: BC Managed Care – PPO | Admitting: Psychiatry

## 2021-03-06 ENCOUNTER — Other Ambulatory Visit: Payer: Self-pay

## 2021-03-06 DIAGNOSIS — F331 Major depressive disorder, recurrent, moderate: Secondary | ICD-10-CM | POA: Diagnosis not present

## 2021-03-06 DIAGNOSIS — F4310 Post-traumatic stress disorder, unspecified: Secondary | ICD-10-CM
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
## 2021-03-13 ENCOUNTER — Other Ambulatory Visit: Payer: Self-pay

## 2021-03-13 MED ORDER — METFORMIN HCL ER 500 MG PO TB24
1000.0000 mg | ORAL_TABLET | Freq: Two times a day (BID) | ORAL | 1 refills | Status: DC
Start: 2021-03-13 — End: 2021-10-16
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
## 2021-03-20 ENCOUNTER — Ambulatory Visit (INDEPENDENT_AMBULATORY_CARE_PROVIDER_SITE_OTHER): Payer: BC Managed Care – PPO | Admitting: Psychiatry

## 2021-03-20 ENCOUNTER — Other Ambulatory Visit: Payer: Self-pay

## 2021-03-20 DIAGNOSIS — F331 Major depressive disorder, recurrent, moderate: Secondary | ICD-10-CM | POA: Diagnosis not present

## 2021-03-20 DIAGNOSIS — F4310 Post-traumatic stress disorder, unspecified: Secondary | ICD-10-CM | POA: Diagnosis not present
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
## 2021-03-21 ENCOUNTER — Encounter: Payer: Self-pay | Admitting: Family Medicine

## 2021-03-21 ENCOUNTER — Ambulatory Visit (INDEPENDENT_AMBULATORY_CARE_PROVIDER_SITE_OTHER): Payer: No Typology Code available for payment source | Admitting: Family Medicine

## 2021-03-21 ENCOUNTER — Other Ambulatory Visit: Payer: Self-pay

## 2021-03-21 VITALS — BP 122/84 | HR 63 | Wt 244.4 lb

## 2021-03-21 DIAGNOSIS — E010 Iodine-deficiency related diffuse (endemic) goiter: Secondary | ICD-10-CM | POA: Diagnosis not present

## 2021-03-21 DIAGNOSIS — Z13220 Encounter for screening for lipoid disorders: Secondary | ICD-10-CM | POA: Diagnosis not present

## 2021-03-21 DIAGNOSIS — E119 Type 2 diabetes mellitus without complications: Secondary | ICD-10-CM

## 2021-03-21 DIAGNOSIS — Z6841 Body Mass Index (BMI) 40.0 and over, adult: Secondary | ICD-10-CM

## 2021-03-21 LAB — POCT GLYCOSYLATED HEMOGLOBIN (HGB A1C): HbA1c, POC (controlled diabetic range): 7.3 % — AB (ref 0.0–7.0)

## 2021-03-21 NOTE — Patient Instructions (Addendum)
Good to see you today - Thank you for coming in  Things we discussed today:  Diabetes Really good - keep taking all the medications as you are  Weight Really good - keep taking the diabetes medicine Try to walk every day Help your neighbor  We will check your cholesterol and your thyroid and  I will send you a letter  You need a mammogram to prevent breast cancer.  Please schedule an appointment.  You can call [phone].    Call Dr Nilda Simmer about the colonscopy   Please always bring your medication bottles  Come back to see me in 3 months

## 2021-03-21 NOTE — Progress Notes (Signed)
    SUBJECTIVE:   CHIEF COMPLAINT / HPI:   DIABETES Taking metformin and jardiance regularly.  No symptoms of low blood sugar  Weight Has lost about 5 lbs  MOOD Checked 2 on number 9 but actually feels she is doing better.  No active suicidal thoughts   PERTINENT  PMH / PSH: Seeing Dr Armandina Stammer psychiatry regularly   OBJECTIVE:   BP 122/84   Pulse 63   Wt 244 lb 6.4 oz (110.9 kg)   SpO2 97%   BMI 41.95 kg/m   Heart - Regular rate and rhythm.  No murmurs, gallops or rubs.    Lungs:  Normal respiratory effort, chest expands symmetrically. Lungs are clear to auscultation, no crackles or wheezes. Extremities:  No cyanosis, edema, or deformity noted with good range of motion of all major joints.     ASSESSMENT/PLAN:   Type 2 diabetes mellitus without complications (HCC) Improved continue current medications   Obesity Improved - Continue jardiance.   Encourage walking.  If stops making progress will consider adding and GLP1 for diabetes   Thyromegaly Has thyromegaly on exam.  She is swallowing ok.  Will check TSH and continue to monitor.  Has swallow study in 2021 that showed no obstruction      Lind Covert, MD [place]

## 2021-03-21 NOTE — Assessment & Plan Note (Signed)
Improved continue current medications

## 2021-03-21 NOTE — Assessment & Plan Note (Signed)
Has thyromegaly on exam.  She is swallowing ok.  Will check TSH and continue to monitor.  Has swallow study in 2021 that showed no obstruction

## 2021-03-21 NOTE — Assessment & Plan Note (Signed)
Improved - Continue jardiance.   Encourage walking.  If stops making progress will consider adding and GLP1 for diabetes

## 2021-03-22 ENCOUNTER — Telehealth (INDEPENDENT_AMBULATORY_CARE_PROVIDER_SITE_OTHER): Payer: BC Managed Care – PPO | Admitting: Psychiatry

## 2021-03-22 ENCOUNTER — Encounter: Payer: Self-pay | Admitting: Family Medicine

## 2021-03-22 ENCOUNTER — Encounter: Payer: Self-pay | Admitting: Psychiatry

## 2021-03-22 DIAGNOSIS — F331 Major depressive disorder, recurrent, moderate: Secondary | ICD-10-CM | POA: Diagnosis not present

## 2021-03-22 DIAGNOSIS — F4310 Post-traumatic stress disorder, unspecified: Secondary | ICD-10-CM | POA: Diagnosis not present

## 2021-03-22 LAB — LIPID PANEL
Chol/HDL Ratio: 3.8 ratio (ref 0.0–4.4)
Cholesterol, Total: 158 mg/dL (ref 100–199)
HDL: 42 mg/dL (ref >39)
LDL Chol Calc (NIH): 88 mg/dL (ref 0–99)
Triglycerides: 161 mg/dL — ABNORMAL HIGH (ref 0–149)
VLDL Cholesterol Cal: 28 mg/dL (ref 5–40)

## 2021-03-22 LAB — TSH: TSH: 1.58 u[IU]/mL (ref 0.450–4.500)

## 2021-03-22 MED ORDER — PRAZOSIN HCL 2 MG PO CAPS: 2.0000 mg | ORAL_CAPSULE | Freq: Every day | ORAL | 0 refills | Status: DC

## 2021-04-03 ENCOUNTER — Other Ambulatory Visit: Payer: Self-pay

## 2021-04-03 ENCOUNTER — Ambulatory Visit (INDEPENDENT_AMBULATORY_CARE_PROVIDER_SITE_OTHER): Payer: BC Managed Care – PPO | Admitting: Psychiatry

## 2021-04-03 DIAGNOSIS — F331 Major depressive disorder, recurrent, moderate: Secondary | ICD-10-CM | POA: Diagnosis not present

## 2021-04-03 DIAGNOSIS — F4310 Post-traumatic stress disorder, unspecified: Secondary | ICD-10-CM
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
## 2021-04-16 ENCOUNTER — Telehealth: Payer: Self-pay

## 2021-04-16 ENCOUNTER — Other Ambulatory Visit: Payer: Self-pay | Admitting: Psychiatry

## 2021-04-16 MED ORDER — VENLAFAXINE HCL ER 150 MG PO CP24: 150.0000 mg | ORAL_CAPSULE | Freq: Every day | ORAL | 0 refills | Status: DC
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
## 2021-04-16 NOTE — Telephone Encounter (Signed)
received fax requesting a 90 day supply of the venlafaxine hcl er 150mg 

## 2021-04-16 NOTE — Telephone Encounter (Signed)
Ordered

## 2021-04-17 ENCOUNTER — Ambulatory Visit (HOSPITAL_COMMUNITY): Payer: No Typology Code available for payment source | Admitting: Psychiatry

## 2021-04-23 ENCOUNTER — Other Ambulatory Visit: Payer: Self-pay

## 2021-04-23 ENCOUNTER — Encounter: Payer: Self-pay | Admitting: Psychiatry

## 2021-04-23 ENCOUNTER — Ambulatory Visit (INDEPENDENT_AMBULATORY_CARE_PROVIDER_SITE_OTHER): Payer: BC Managed Care – PPO | Admitting: Psychiatry

## 2021-04-23 VITALS — BP 122/82 | HR 83 | Temp 97.8°F | Ht 65.35 in | Wt 245.6 lb

## 2021-04-23 DIAGNOSIS — F331 Major depressive disorder, recurrent, moderate: Secondary | ICD-10-CM

## 2021-04-23 DIAGNOSIS — F4310 Post-traumatic stress disorder, unspecified: Secondary | ICD-10-CM | POA: Diagnosis not present

## 2021-04-23 DIAGNOSIS — G4700 Insomnia, unspecified: Secondary | ICD-10-CM

## 2021-04-23 MED ORDER — BUPROPION HCL ER (XL) 150 MG PO TB24
ORAL_TABLET | ORAL | 1 refills | Status: DC
Start: 2021-05-06 — End: 2021-10-15

## 2021-04-23 MED ORDER — TRAZODONE HCL 50 MG PO TABS: 50.0000 mg | ORAL_TABLET | Freq: Every evening | ORAL | 1 refills | Status: DC | PRN

## 2021-04-23 MED ORDER — BUPROPION HCL ER (XL) 300 MG PO TB24
ORAL_TABLET | ORAL | 1 refills | Status: DC
Start: 2021-04-23 — End: 2021-10-15

## 2021-04-23 MED ORDER — VENLAFAXINE HCL ER 37.5 MG PO CP24: ORAL_CAPSULE | ORAL | 0 refills | Status: DC

## 2021-04-26 ENCOUNTER — Ambulatory Visit (HOSPITAL_COMMUNITY): Payer: No Typology Code available for payment source | Admitting: Psychiatry

## 2021-04-26 ENCOUNTER — Other Ambulatory Visit: Payer: Self-pay

## 2021-05-03 ENCOUNTER — Other Ambulatory Visit: Payer: Self-pay

## 2021-05-03 ENCOUNTER — Ambulatory Visit (INDEPENDENT_AMBULATORY_CARE_PROVIDER_SITE_OTHER): Payer: BC Managed Care – PPO | Admitting: Psychiatry

## 2021-05-03 DIAGNOSIS — F4310 Post-traumatic stress disorder, unspecified: Secondary | ICD-10-CM | POA: Diagnosis not present

## 2021-05-03 DIAGNOSIS — F331 Major depressive disorder, recurrent, moderate: Secondary | ICD-10-CM | POA: Diagnosis not present
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
## 2021-05-08 ENCOUNTER — Telehealth (HOSPITAL_COMMUNITY): Payer: Self-pay | Admitting: Psychiatry

## 2021-05-08 NOTE — Telephone Encounter (Signed)
Called to schedule f/u appt, not able to leave vm due to full mailbox

## 2021-05-17 ENCOUNTER — Telehealth (HOSPITAL_COMMUNITY): Payer: Self-pay | Admitting: Psychiatry

## 2021-05-17 ENCOUNTER — Ambulatory Visit (INDEPENDENT_AMBULATORY_CARE_PROVIDER_SITE_OTHER): Payer: BC Managed Care – PPO | Admitting: Psychiatry

## 2021-05-17 ENCOUNTER — Other Ambulatory Visit: Payer: Self-pay

## 2021-05-17 DIAGNOSIS — F331 Major depressive disorder, recurrent, moderate: Secondary | ICD-10-CM

## 2021-05-17 DIAGNOSIS — F4310 Post-traumatic stress disorder, unspecified: Secondary | ICD-10-CM | POA: Diagnosis not present

## 2021-05-17 NOTE — Telephone Encounter (Signed)
Therapist attempted to contact patient twice via text through [place] for scheduled appointment, no response.  Therapist called patient but received voicemail message indicating voicemail box is full.
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
## 2021-05-22 ENCOUNTER — Telehealth: Payer: Self-pay

## 2021-05-22 ENCOUNTER — Other Ambulatory Visit: Payer: Self-pay | Admitting: Psychiatry

## 2021-05-22 MED ORDER — VENLAFAXINE HCL ER 37.5 MG PO CP24: ORAL_CAPSULE | ORAL | 0 refills | Status: DC

## 2021-05-22 NOTE — Telephone Encounter (Signed)
received fax requesting a refill on the abilify the venlafaxine xr

## 2021-05-22 NOTE — Telephone Encounter (Signed)
Ordered venlafaxine. She should have enough Abilify until the next visit
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
## 2021-05-25 ENCOUNTER — Other Ambulatory Visit: Payer: Self-pay

## 2021-05-25 ENCOUNTER — Telehealth (INDEPENDENT_AMBULATORY_CARE_PROVIDER_SITE_OTHER): Payer: BC Managed Care – PPO | Admitting: Psychiatry

## 2021-05-25 ENCOUNTER — Encounter: Payer: Self-pay | Admitting: Psychiatry

## 2021-05-25 DIAGNOSIS — F331 Major depressive disorder, recurrent, moderate: Secondary | ICD-10-CM | POA: Diagnosis not present

## 2021-05-25 DIAGNOSIS — F4310 Post-traumatic stress disorder, unspecified: Secondary | ICD-10-CM | POA: Diagnosis not present

## 2021-05-25 MED ORDER — ARIPIPRAZOLE 15 MG PO TABS: 15.0000 mg | ORAL_TABLET | Freq: Every day | ORAL | 0 refills | Status: DC

## 2021-05-25 NOTE — Patient Instructions (Signed)
1. Continue venlafaxine 187.5  mg daily 2.Continuebupropion 450 mg (300 mg + 150 mg) daily 3.Continue Abilify 15 mg daily 5.Continueprazosin2 mg at night 6.ContinueTrazodone 50 mg at night as needed for sleep 7. Next appointment:7/8 at 10 AM

## 2021-05-31 ENCOUNTER — Other Ambulatory Visit: Payer: Self-pay

## 2021-05-31 ENCOUNTER — Ambulatory Visit (INDEPENDENT_AMBULATORY_CARE_PROVIDER_SITE_OTHER): Payer: BC Managed Care – PPO | Admitting: Psychiatry

## 2021-05-31 DIAGNOSIS — F331 Major depressive disorder, recurrent, moderate: Secondary | ICD-10-CM

## 2021-05-31 DIAGNOSIS — F4310 Post-traumatic stress disorder, unspecified: Secondary | ICD-10-CM | POA: Diagnosis not present
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
## 2021-06-14 ENCOUNTER — Other Ambulatory Visit: Payer: Self-pay

## 2021-06-14 ENCOUNTER — Ambulatory Visit (INDEPENDENT_AMBULATORY_CARE_PROVIDER_SITE_OTHER): Payer: BC Managed Care – PPO | Admitting: Psychiatry

## 2021-06-14 DIAGNOSIS — F4310 Post-traumatic stress disorder, unspecified: Secondary | ICD-10-CM | POA: Diagnosis not present

## 2021-06-14 DIAGNOSIS — F331 Major depressive disorder, recurrent, moderate: Secondary | ICD-10-CM | POA: Diagnosis not present
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
## 2021-06-20 ENCOUNTER — Encounter: Payer: Self-pay | Admitting: Family Medicine

## 2021-06-21 ENCOUNTER — Other Ambulatory Visit: Payer: Self-pay | Admitting: Psychiatry

## 2021-06-21 ENCOUNTER — Telehealth: Payer: Self-pay

## 2021-06-21 MED ORDER — VENLAFAXINE HCL ER 37.5 MG PO CP24: ORAL_CAPSULE | ORAL | 0 refills | Status: DC

## 2021-06-21 NOTE — Telephone Encounter (Signed)
Ordered

## 2021-06-21 NOTE — Telephone Encounter (Signed)
received fax requesting a refill on the venlafaxine 37.5mg 

## 2021-06-26 ENCOUNTER — Ambulatory Visit: Payer: No Typology Code available for payment source | Admitting: Family Medicine

## 2021-06-27 ENCOUNTER — Ambulatory Visit (INDEPENDENT_AMBULATORY_CARE_PROVIDER_SITE_OTHER): Payer: BC Managed Care – PPO | Admitting: Psychiatry

## 2021-06-27 ENCOUNTER — Other Ambulatory Visit: Payer: Self-pay

## 2021-06-27 DIAGNOSIS — F331 Major depressive disorder, recurrent, moderate: Secondary | ICD-10-CM

## 2021-06-27 DIAGNOSIS — F4310 Post-traumatic stress disorder, unspecified: Secondary | ICD-10-CM
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
## 2021-06-29 ENCOUNTER — Encounter: Payer: Self-pay | Admitting: Psychiatry

## 2021-06-29 ENCOUNTER — Telehealth (INDEPENDENT_AMBULATORY_CARE_PROVIDER_SITE_OTHER): Payer: Medicare Other | Admitting: Psychiatry

## 2021-06-29 ENCOUNTER — Other Ambulatory Visit: Payer: Self-pay

## 2021-06-29 DIAGNOSIS — F331 Major depressive disorder, recurrent, moderate: Secondary | ICD-10-CM | POA: Diagnosis not present

## 2021-06-29 DIAGNOSIS — F4310 Post-traumatic stress disorder, unspecified: Secondary | ICD-10-CM

## 2021-06-29 MED ORDER — ARIPIPRAZOLE 10 MG PO TABS: 10.0000 mg | ORAL_TABLET | Freq: Every day | ORAL | 0 refills | Status: DC

## 2021-06-29 MED ORDER — VENLAFAXINE HCL ER 150 MG PO CP24: ORAL_CAPSULE | ORAL | 1 refills | Status: DC

## 2021-06-29 MED ORDER — PRAZOSIN HCL 2 MG PO CAPS: 2.0000 mg | ORAL_CAPSULE | Freq: Every day | ORAL | 0 refills | Status: DC

## 2021-06-29 NOTE — Patient Instructions (Signed)
1. Continue venlafaxine 187.5  mg daily  2. Continue bupropion 450 mg (300 mg + 150 mg) daily 3. Decrease Abilify 10 mg daily 5. Continue prazosin 2 mg at night   6. Continue Trazodone 50 mg at night as needed for sleep 7. Next appointment: 8/17 at 10 AM

## 2021-07-10 ENCOUNTER — Other Ambulatory Visit: Payer: Self-pay

## 2021-07-10 ENCOUNTER — Ambulatory Visit (INDEPENDENT_AMBULATORY_CARE_PROVIDER_SITE_OTHER): Payer: BC Managed Care – PPO | Admitting: Family Medicine

## 2021-07-10 ENCOUNTER — Encounter: Payer: Self-pay | Admitting: Family Medicine

## 2021-07-10 ENCOUNTER — Ambulatory Visit (INDEPENDENT_AMBULATORY_CARE_PROVIDER_SITE_OTHER): Payer: BC Managed Care – PPO

## 2021-07-10 VITALS — BP 131/85 | HR 95 | Wt 243.8 lb

## 2021-07-10 DIAGNOSIS — E119 Type 2 diabetes mellitus without complications: Secondary | ICD-10-CM

## 2021-07-10 DIAGNOSIS — F3341 Major depressive disorder, recurrent, in partial remission: Secondary | ICD-10-CM

## 2021-07-10 DIAGNOSIS — E010 Iodine-deficiency related diffuse (endemic) goiter: Secondary | ICD-10-CM | POA: Diagnosis not present

## 2021-07-10 DIAGNOSIS — C772 Secondary and unspecified malignant neoplasm of intra-abdominal lymph nodes: Secondary | ICD-10-CM

## 2021-07-10 DIAGNOSIS — C187 Malignant neoplasm of sigmoid colon: Secondary | ICD-10-CM | POA: Diagnosis not present

## 2021-07-10 DIAGNOSIS — Z23 Encounter for immunization: Secondary | ICD-10-CM

## 2021-07-10 LAB — POCT GLYCOSYLATED HEMOGLOBIN (HGB A1C): HbA1c, POC (controlled diabetic range): 7 % (ref 0.0–7.0)

## 2021-07-10 NOTE — Assessment & Plan Note (Signed)
Stable continue to follow with psychiatry

## 2021-07-10 NOTE — Assessment & Plan Note (Signed)
Given her worsening symptoms will refer to surgery for evaluation.  She would be interested in surgery since it interferes with her life and she is afraid of choking

## 2021-07-10 NOTE — Assessment & Plan Note (Signed)
Improving control - continue current medications

## 2021-07-10 NOTE — Progress Notes (Signed)
    SUBJECTIVE:   CHIEF COMPLAINT / HPI:   Thyroid Feels she is having more troubles with swallowing and choking if she is not very careful.  Feels has to swallow on her left side.  Swallowing study in 2021 did not show obstruction.  Last [name] in 2020  Diabetes Walking more and trying to watch her diet.  Taking medications daily.  No low blood sugar   Depression Actually feels better than usual but always has baseline thoughts of not sure why here.  No suicidal ideation   PERTINENT  PMH / PSH: continues to see psychiatry  OBJECTIVE:   BP 131/85   Pulse 95   Wt 243 lb 12.8 oz (110.6 kg)   SpO2 97%   BMI 40.13 kg/m   Large obvious goiter larger on R side   ASSESSMENT/PLAN:   Type 2 diabetes mellitus without complications (HCC) Improving control - continue current medications   Thyromegaly Given her worsening symptoms will refer to surgery for evaluation.  She would be interested in surgery since it interferes with her life and she is afraid of choking   MDD (major depressive disorder), recurrent, in partial remission ([place]) Stable continue to follow with psychiatry      Lind Covert, MD [place]

## 2021-07-10 NOTE — Assessment & Plan Note (Signed)
Released from oncology with cure in Jan 2021

## 2021-07-10 NOTE — Patient Instructions (Signed)
Good to see you today - Thank you for coming in  Things we discussed today:  Thyroid - refer you to surgery.  They should call you within 2 weeks.  If you dont hear from them then call me   I will call you if your tests are not good.  Otherwise, I will send you a message on MyChart (if it is active) or a letter in the mail..  If you do not hear from me with in 2 weeks please call our office.    You need an diabetes eye exam every year.  Please see your eye doctor.  Ask them to fax [name] a report of your exam   Keep exercising and loosing weight. Cut back on fatty things   Please always bring your medication bottles  Come back to see me in 3 months

## 2021-07-11 NOTE — Progress Notes (Signed)
   Covid-19 Vaccination Clinic  Name:  LINAH KLAPPER    MRN: [id number] DOB: [birth year]  07/11/2021  Ms. Maysonet was observed post Covid-19 immunization for 15 minutes without incident. She was provided with Vaccine Information Sheet and instruction to access the V-Safe system.   Ms. Stoneking was instructed to call 911 with any severe reactions post vaccine: Difficulty breathing  Swelling of face and throat  A fast heartbeat  A bad rash all over body  Dizziness and weakness

## 2021-07-12 ENCOUNTER — Other Ambulatory Visit: Payer: Self-pay

## 2021-07-12 ENCOUNTER — Encounter: Payer: Self-pay | Admitting: Family Medicine

## 2021-07-12 ENCOUNTER — Ambulatory Visit (INDEPENDENT_AMBULATORY_CARE_PROVIDER_SITE_OTHER): Payer: BC Managed Care – PPO | Admitting: Psychiatry

## 2021-07-12 DIAGNOSIS — F331 Major depressive disorder, recurrent, moderate: Secondary | ICD-10-CM

## 2021-07-12 DIAGNOSIS — F4310 Post-traumatic stress disorder, unspecified: Secondary | ICD-10-CM | POA: Diagnosis not present

## 2021-07-12 LAB — CMP14+EGFR
ALT: 17 IU/L (ref 0–32)
AST: 18 IU/L (ref 0–40)
Albumin/Globulin Ratio: 1.9 (ref 1.2–2.2)
Albumin: 4.5 g/dL (ref 3.8–4.9)
Alkaline Phosphatase: 74 IU/L (ref 44–121)
BUN/Creatinine Ratio: 17 (ref 9–23)
BUN: 15 mg/dL (ref 6–24)
Bilirubin Total: 0.3 mg/dL (ref 0.0–1.2)
CO2: 20 mmol/L (ref 20–29)
Calcium: 9.8 mg/dL (ref 8.7–10.2)
Chloride: 101 mmol/L (ref 96–106)
Creatinine, Ser: 0.89 mg/dL (ref 0.57–1.00)
Globulin, Total: 2.4 g/dL (ref 1.5–4.5)
Glucose: 145 mg/dL — ABNORMAL HIGH (ref 65–99)
Potassium: 4 mmol/L (ref 3.5–5.2)
Sodium: 141 mmol/L (ref 134–144)
Total Protein: 6.9 g/dL (ref 6.0–8.5)
eGFR: 75 mL/min/{1.73_m2} (ref >59)

## 2021-07-12 LAB — CBC
Hematocrit: 41.1 % (ref 34.0–46.6)
Hemoglobin: 13.1 g/dL (ref 11.1–15.9)
MCH: 26.5 pg — ABNORMAL LOW (ref 26.6–33.0)
MCHC: 31.9 g/dL (ref 31.5–35.7)
MCV: 83 fL (ref 79–97)
Platelets: 280 10*3/uL (ref 150–450)
RBC: 4.94 x10E6/uL (ref 3.77–5.28)
RDW: 15.7 % — ABNORMAL HIGH (ref 11.7–15.4)
WBC: 6 10*3/uL (ref 3.4–10.8)

## 2021-07-12 LAB — TSH: TSH: 1.3 u[IU]/mL (ref 0.450–4.500)
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
## 2021-07-13 ENCOUNTER — Encounter: Payer: Self-pay | Admitting: Family Medicine

## 2021-07-24 ENCOUNTER — Telehealth: Payer: Self-pay

## 2021-07-24 DIAGNOSIS — F4310 Post-traumatic stress disorder, unspecified: Secondary | ICD-10-CM

## 2021-07-24 MED ORDER — VENLAFAXINE HCL ER 37.5 MG PO CP24: ORAL_CAPSULE | ORAL | 0 refills | Status: DC

## 2021-07-24 NOTE — Telephone Encounter (Signed)
received a fax requesting a refill on the venlafaxine hcl er 37.'5mg'$ 

## 2021-07-24 NOTE — Telephone Encounter (Signed)
I have sent venlafaxine to pharmacy-37.5 mg.

## 2021-07-26 ENCOUNTER — Ambulatory Visit (HOSPITAL_COMMUNITY): Payer: BC Managed Care – PPO | Admitting: Psychiatry

## 2021-07-26 ENCOUNTER — Other Ambulatory Visit: Payer: Self-pay
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
## 2021-08-08 ENCOUNTER — Encounter: Payer: Self-pay | Admitting: Psychiatry

## 2021-08-08 ENCOUNTER — Telehealth (INDEPENDENT_AMBULATORY_CARE_PROVIDER_SITE_OTHER): Payer: BC Managed Care – PPO | Admitting: Psychiatry

## 2021-08-08 ENCOUNTER — Other Ambulatory Visit: Payer: Self-pay

## 2021-08-08 DIAGNOSIS — G4700 Insomnia, unspecified: Secondary | ICD-10-CM | POA: Diagnosis not present

## 2021-08-08 DIAGNOSIS — F4310 Post-traumatic stress disorder, unspecified: Secondary | ICD-10-CM

## 2021-08-08 DIAGNOSIS — F331 Major depressive disorder, recurrent, moderate: Secondary | ICD-10-CM

## 2021-08-08 NOTE — Patient Instructions (Signed)
1. Continue venlafaxine 187.5  mg daily  2. Continue bupropion 450 mg (300 mg + 150 mg) daily 3. Continue Abilify 10 mg daily  5. Continue prazosin 2 mg at night  6. Continue Trazodone 50 mg at night as needed for sleep 7. Next appointment: 9/12 at 10:30

## 2021-08-09 ENCOUNTER — Other Ambulatory Visit: Payer: Self-pay

## 2021-08-09 ENCOUNTER — Ambulatory Visit (INDEPENDENT_AMBULATORY_CARE_PROVIDER_SITE_OTHER): Payer: BC Managed Care – PPO | Admitting: Psychiatry

## 2021-08-09 DIAGNOSIS — F331 Major depressive disorder, recurrent, moderate: Secondary | ICD-10-CM | POA: Diagnosis not present

## 2021-08-09 DIAGNOSIS — F4310 Post-traumatic stress disorder, unspecified: Secondary | ICD-10-CM
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
## 2021-08-18 ENCOUNTER — Other Ambulatory Visit: Payer: Self-pay | Admitting: Psychiatry

## 2021-08-18 DIAGNOSIS — F4310 Post-traumatic stress disorder, unspecified: Secondary | ICD-10-CM

## 2021-08-20 ENCOUNTER — Institutional Professional Consult (permissible substitution): Payer: No Typology Code available for payment source | Admitting: Neurology

## 2021-08-23 ENCOUNTER — Ambulatory Visit (INDEPENDENT_AMBULATORY_CARE_PROVIDER_SITE_OTHER): Payer: BC Managed Care – PPO | Admitting: Psychiatry

## 2021-08-23 ENCOUNTER — Other Ambulatory Visit: Payer: Self-pay

## 2021-08-23 DIAGNOSIS — F331 Major depressive disorder, recurrent, moderate: Secondary | ICD-10-CM

## 2021-08-23 DIAGNOSIS — F4310 Post-traumatic stress disorder, unspecified: Secondary | ICD-10-CM | POA: Diagnosis not present
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
## 2021-09-03 ENCOUNTER — Encounter: Payer: Self-pay | Admitting: Psychiatry

## 2021-09-03 ENCOUNTER — Ambulatory Visit (INDEPENDENT_AMBULATORY_CARE_PROVIDER_SITE_OTHER): Payer: Medicare HMO | Admitting: Psychiatry

## 2021-09-03 ENCOUNTER — Telehealth: Payer: Self-pay | Admitting: Psychiatry

## 2021-09-03 ENCOUNTER — Other Ambulatory Visit: Payer: Self-pay

## 2021-09-03 VITALS — BP 145/87 | HR 87 | Temp 97.9°F | Wt 243.6 lb

## 2021-09-03 DIAGNOSIS — F4310 Post-traumatic stress disorder, unspecified: Secondary | ICD-10-CM

## 2021-09-03 DIAGNOSIS — F331 Major depressive disorder, recurrent, moderate: Secondary | ICD-10-CM

## 2021-09-03 DIAGNOSIS — R69 Illness, unspecified: Secondary | ICD-10-CM | POA: Diagnosis not present

## 2021-09-03 MED ORDER — ARIPIPRAZOLE 10 MG PO TABS
10.0000 mg | ORAL_TABLET | Freq: Every day | ORAL | 0 refills | Status: DC
Start: 2021-09-28 — End: 2021-12-25

## 2021-09-03 NOTE — Telephone Encounter (Signed)
called patient an gave her the phone number per dr. Modesta Messing order for sleep apnea

## 2021-09-03 NOTE — Telephone Encounter (Signed)
She no showed to the evaluation of sleep apnea.  Could you contact the patient to contact [phone] for initial evaluation? thanks

## 2021-09-04 ENCOUNTER — Ambulatory Visit: Payer: BC Managed Care – PPO | Admitting: Family Medicine

## 2021-09-04 ENCOUNTER — Other Ambulatory Visit: Payer: Self-pay

## 2021-09-04 ENCOUNTER — Encounter: Payer: Self-pay | Admitting: Family Medicine

## 2021-09-04 DIAGNOSIS — M542 Cervicalgia: Secondary | ICD-10-CM

## 2021-09-04 MED ORDER — IBUPROFEN 600 MG PO TABS: 600.0000 mg | ORAL_TABLET | Freq: Three times a day (TID) | ORAL | 0 refills | Status: DC | PRN

## 2021-09-04 MED ORDER — GABAPENTIN 300 MG PO CAPS: ORAL_CAPSULE | ORAL | 1 refills | Status: DC

## 2021-09-04 NOTE — Patient Instructions (Signed)
Good to see you today - Thank you for coming in  Things we discussed today:  For the neck pain - Take ibuprofen 1 tab three times a day as needed with a little food - Can take the gabapentin your regular nightly dose and an extra one - two during the day If the pain is not improved within 4-5 days then go get the xray -Continue to use heat and the collar   - If not better in one week or if getting any worse especially any weakness let me know  Please always bring your medication bottles  Come back to see me in one month to check on diabetes

## 2021-09-04 NOTE — Assessment & Plan Note (Signed)
Likely due to muscle spasm possibly secondary to nerve impingement.  No signs of meningitis or bony abnormality or infection. Does have a history of cancer in remission.  May be worsened by being out of gabapentin.   Treat with ibuprofen and resuming gabapentin and heat and collar.  If not improving in a few days get xray to rule out bony lesion.

## 2021-09-04 NOTE — Progress Notes (Signed)
    SUBJECTIVE:   CHIEF COMPLAINT / HPI:   Neck Pain Four days ago developed pain in the back of her neck that now radiates down to upper back and anterior to clavicle R> L.  No known trauma or insult.  Her muscles feel very tight.  Took a muscle relaxer and aleve which helps some.  Has run out of gabapentin.  No weakness or fever or sore throat or new problems swallowing.   PERTINENT  PMH / PSH: Thyromegaly awaiting surgery appointment   OBJECTIVE:   BP (!) 156/86   Pulse 90   Wt 238 lb 6.4 oz (108.1 kg)   SpO2 96%   BMI 39.24 kg/m   Psych:  Cognition and judgment appear intact. Alert, communicative  and cooperative with normal attention span and concentration.  Neck - limited ROM in all quadrants due to pain.  Strap and upper back muscles are tight and tender.  No adenopathy but prominent thyroid.  Normal throat exam  Normal strength in upper and lower extremities   ASSESSMENT/PLAN:   Acute neck pain Likely due to muscle spasm possibly secondary to nerve impingement.  No signs of meningitis or bony abnormality or infection. Does have a history of cancer in remission.  May be worsened by being out of gabapentin.   Treat with ibuprofen and resuming gabapentin and heat and collar.  If not improving in a few days get xray to rule out bony lesion.     Lind Covert, MD [place]

## 2021-09-06 ENCOUNTER — Ambulatory Visit (INDEPENDENT_AMBULATORY_CARE_PROVIDER_SITE_OTHER): Payer: BC Managed Care – PPO | Admitting: Psychiatry

## 2021-09-06 ENCOUNTER — Encounter: Payer: Self-pay | Admitting: Family Medicine

## 2021-09-06 ENCOUNTER — Other Ambulatory Visit: Payer: Self-pay

## 2021-09-06 DIAGNOSIS — F4310 Post-traumatic stress disorder, unspecified: Secondary | ICD-10-CM

## 2021-09-06 DIAGNOSIS — F331 Major depressive disorder, recurrent, moderate: Secondary | ICD-10-CM
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
## 2021-09-11 ENCOUNTER — Telehealth: Payer: Self-pay

## 2021-09-11 ENCOUNTER — Ambulatory Visit
Admission: RE | Admit: 2021-09-11 | Discharge: 2021-09-11 | Disposition: A | Discharge status: Home / Self Care | Payer: BC Managed Care – PPO | Source: Ambulatory Visit | Attending: Family Medicine | Admitting: Family Medicine

## 2021-09-11 DIAGNOSIS — M542 Cervicalgia: Secondary | ICD-10-CM | POA: Diagnosis not present

## 2021-09-11 NOTE — Telephone Encounter (Signed)
Patient calls nurse line reporting continued neck pain. Patient reports she has been taking ibuprofen and gabapentin with little relief. Patient has a FU scheduled for 10/6 with PCP. Patient reminded to get xray done, patient plans to do this today. Patient requesting any other recommendations.

## 2021-09-12 DIAGNOSIS — D120 Benign neoplasm of cecum: Secondary | ICD-10-CM | POA: Diagnosis not present

## 2021-09-12 DIAGNOSIS — Z86010 Personal history of colonic polyps: Secondary | ICD-10-CM | POA: Diagnosis not present

## 2021-09-12 DIAGNOSIS — K635 Polyp of colon: Secondary | ICD-10-CM | POA: Diagnosis not present

## 2021-09-12 DIAGNOSIS — Z1211 Encounter for screening for malignant neoplasm of colon: Secondary | ICD-10-CM | POA: Diagnosis not present

## 2021-09-12 DIAGNOSIS — K5140 Inflammatory polyps of colon without complications: Secondary | ICD-10-CM | POA: Diagnosis not present

## 2021-09-12 DIAGNOSIS — Z980 Intestinal bypass and anastomosis status: Secondary | ICD-10-CM | POA: Diagnosis not present

## 2021-09-12 DIAGNOSIS — Z85038 Personal history of other malignant neoplasm of large intestine: Secondary | ICD-10-CM | POA: Diagnosis not present

## 2021-09-12 MED ORDER — IBUPROFEN 600 MG PO TABS: 600.0000 mg | ORAL_TABLET | Freq: Four times a day (QID) | ORAL | 0 refills | Status: DC | PRN

## 2021-09-12 NOTE — Telephone Encounter (Signed)
Called  Discussed xray and her pain which is some better with ibuprofen butt it wears off  Suggested Take ibuprofen 600 mg four times a day as needed and increase gabapetin to 3 caps at night and 2 in the AM  Let me know if not improving  See me on 10-6

## 2021-09-13 ENCOUNTER — Ambulatory Visit (HOSPITAL_COMMUNITY): Payer: Medicare HMO | Admitting: Psychiatry

## 2021-09-13 ENCOUNTER — Other Ambulatory Visit: Payer: Self-pay

## 2021-09-17 ENCOUNTER — Ambulatory Visit (HOSPITAL_COMMUNITY)
Admission: EM | Admit: 2021-09-17 | Discharge: 2021-09-17 | Disposition: A | Discharge status: Home / Self Care | Payer: Medicare HMO | Attending: Emergency Medicine | Admitting: Emergency Medicine

## 2021-09-17 ENCOUNTER — Encounter (HOSPITAL_COMMUNITY): Payer: Self-pay | Admitting: Emergency Medicine

## 2021-09-17 ENCOUNTER — Other Ambulatory Visit: Payer: Self-pay

## 2021-09-17 DIAGNOSIS — S161XXD Strain of muscle, fascia and tendon at neck level, subsequent encounter: Secondary | ICD-10-CM | POA: Diagnosis not present

## 2021-09-17 MED ORDER — CYCLOBENZAPRINE HCL 5 MG PO TABS: 5.0000 mg | ORAL_TABLET | Freq: Two times a day (BID) | ORAL | 0 refills | Status: DC | PRN

## 2021-09-17 MED ORDER — DICLOFENAC SODIUM 1 % EX GEL: 2.0000 g | Freq: Four times a day (QID) | CUTANEOUS | 4 refills | Status: AC
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
## 2021-09-17 NOTE — Discharge Instructions (Addendum)
Only use heat or cold 15-20 minutes out of every hour or 2.  Don't put heat or cold on and leave it on for a long time as it will make your muscle pain worse.   Do neck exercises twice a day - make sure to do them GENTLY.   Follow up with Dr. Erin Hearing.  If you continue to have neck pain, ask him about the possibility of going to physical therapy.   You can use the cyclobenzaprine muscle relaxer twice a day but stop taking it if you think it's making you too sleepy.   Use diclofenac gel to your sore neck up to 4 times a day for pain.

## 2021-09-17 NOTE — ED Triage Notes (Signed)
Pt presents with neck pain xs 3 weeks. Denies injury or fall. States radiates in head and down into back.

## 2021-09-20 ENCOUNTER — Ambulatory Visit (INDEPENDENT_AMBULATORY_CARE_PROVIDER_SITE_OTHER): Payer: Medicare HMO | Admitting: Psychiatry

## 2021-09-20 ENCOUNTER — Other Ambulatory Visit: Payer: Self-pay

## 2021-09-20 DIAGNOSIS — F331 Major depressive disorder, recurrent, moderate: Secondary | ICD-10-CM

## 2021-09-20 DIAGNOSIS — R69 Illness, unspecified: Secondary | ICD-10-CM | POA: Diagnosis not present

## 2021-09-20 DIAGNOSIS — F4310 Post-traumatic stress disorder, unspecified: Secondary | ICD-10-CM | POA: Diagnosis not present
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
## 2021-09-22 DIAGNOSIS — M2550 Pain in unspecified joint: Secondary | ICD-10-CM | POA: Diagnosis not present

## 2021-09-22 DIAGNOSIS — J309 Allergic rhinitis, unspecified: Secondary | ICD-10-CM | POA: Diagnosis not present

## 2021-09-22 DIAGNOSIS — E1142 Type 2 diabetes mellitus with diabetic polyneuropathy: Secondary | ICD-10-CM | POA: Diagnosis not present

## 2021-09-22 DIAGNOSIS — R69 Illness, unspecified: Secondary | ICD-10-CM | POA: Diagnosis not present

## 2021-09-22 DIAGNOSIS — Z6841 Body Mass Index (BMI) 40.0 and over, adult: Secondary | ICD-10-CM | POA: Diagnosis not present

## 2021-09-22 DIAGNOSIS — G8929 Other chronic pain: Secondary | ICD-10-CM | POA: Diagnosis not present

## 2021-09-22 DIAGNOSIS — R030 Elevated blood-pressure reading, without diagnosis of hypertension: Secondary | ICD-10-CM | POA: Diagnosis not present

## 2021-09-22 DIAGNOSIS — F4323 Adjustment disorder with mixed anxiety and depressed mood: Secondary | ICD-10-CM | POA: Diagnosis not present

## 2021-09-22 DIAGNOSIS — G4700 Insomnia, unspecified: Secondary | ICD-10-CM | POA: Diagnosis not present

## 2021-09-22 DIAGNOSIS — R32 Unspecified urinary incontinence: Secondary | ICD-10-CM | POA: Diagnosis not present

## 2021-09-22 DIAGNOSIS — Z604 Social exclusion and rejection: Secondary | ICD-10-CM | POA: Diagnosis not present

## 2021-09-22 DIAGNOSIS — F321 Major depressive disorder, single episode, moderate: Secondary | ICD-10-CM | POA: Diagnosis not present

## 2021-09-27 ENCOUNTER — Encounter: Payer: Self-pay | Admitting: Family Medicine

## 2021-09-27 ENCOUNTER — Ambulatory Visit (INDEPENDENT_AMBULATORY_CARE_PROVIDER_SITE_OTHER): Payer: Medicare HMO

## 2021-09-27 ENCOUNTER — Ambulatory Visit (INDEPENDENT_AMBULATORY_CARE_PROVIDER_SITE_OTHER): Payer: Medicare HMO | Admitting: Family Medicine

## 2021-09-27 ENCOUNTER — Other Ambulatory Visit: Payer: Self-pay

## 2021-09-27 VITALS — BP 126/92 | HR 75 | Wt 241.2 lb

## 2021-09-27 DIAGNOSIS — Z23 Encounter for immunization: Secondary | ICD-10-CM

## 2021-09-27 DIAGNOSIS — M542 Cervicalgia: Secondary | ICD-10-CM

## 2021-09-27 DIAGNOSIS — E7800 Pure hypercholesterolemia, unspecified: Secondary | ICD-10-CM

## 2021-09-27 DIAGNOSIS — E119 Type 2 diabetes mellitus without complications: Secondary | ICD-10-CM | POA: Diagnosis not present

## 2021-09-27 DIAGNOSIS — E010 Iodine-deficiency related diffuse (endemic) goiter: Secondary | ICD-10-CM

## 2021-09-27 LAB — POCT GLYCOSYLATED HEMOGLOBIN (HGB A1C): HbA1c, POC (controlled diabetic range): 7.9 % — AB (ref 0.0–7.0)

## 2021-09-27 MED ORDER — ATORVASTATIN CALCIUM 20 MG PO TABS
20.0000 mg | ORAL_TABLET | Freq: Every day | ORAL | 3 refills | Status: DC
Start: 2021-09-27 — End: 2022-09-20

## 2021-09-27 NOTE — Assessment & Plan Note (Signed)
Improved. Xray suggestive of arthritis.  Discussed weaning down gabapentin and ibuprofen

## 2021-09-27 NOTE — Patient Instructions (Addendum)
Good to see you today - Thank you for coming in  Things we discussed today:  Neck pain - very glad that is better  Cholesterol - take lipitor 20 mg tab one every day.  You need an diabetes eye exam every year.  Please see your eye doctor.  Ask them to fax [name] a report of your exam   [facility] : [phone] and say you have a referral and would like an appointment for enlarged thyroid   Your A1c is 7.9 - keep taking your medications and exercising  I will   Please always bring your medication bottles  Come back to see me in 3 months

## 2021-09-27 NOTE — Assessment & Plan Note (Signed)
Asked her to call [facility] about an appointment

## 2021-09-27 NOTE — Assessment & Plan Note (Signed)
Slightly worse.  She has not been exercising due to neck pain.  Plans to start back Continue current medications

## 2021-09-27 NOTE — Progress Notes (Signed)
    SUBJECTIVE:   CHIEF COMPLAINT / HPI:   Neck Pain Is much improved.  Still some pain and burning in upper back lower neck but better ROM and no weakness  Diabetes Taking metformin and jardiance regularly  Cholesterol  Last ldl was 88.  She if fine starting a statin  PERTINENT  PMH / PSH: Seeing Dr Armandina Stammer for psychiatriy  OBJECTIVE:   BP (!) 126/92   Pulse 75   Wt 241 lb 3.2 oz (109.4 kg)   SpO2 96%   BMI 39.70 kg/m   Alert interactive Neck - mildly diffusely tender.  Limited range of motion at extremes no focal tenderness or vertebral body tenderness Normal grip strength  ASSESSMENT/PLAN:   Thyromegaly Asked her to call [facility] about an appointment   Acute neck pain Improved. Xray suggestive of arthritis.  Discussed weaning down gabapentin and ibuprofen   Type 2 diabetes mellitus without complications ([place]) Slightly worse.  She has not been exercising due to neck pain.  Plans to start back Continue current medications   Hypercholesteremia LDL of 88.  Start lipitor 20 mg daily      Lind Covert, MD [place]

## 2021-09-27 NOTE — Assessment & Plan Note (Signed)
LDL of 88.  Start lipitor 20 mg daily

## 2021-10-03 ENCOUNTER — Encounter: Payer: Self-pay | Admitting: Family Medicine

## 2021-10-04 ENCOUNTER — Ambulatory Visit (INDEPENDENT_AMBULATORY_CARE_PROVIDER_SITE_OTHER): Payer: Medicare HMO | Admitting: Psychiatry

## 2021-10-04 ENCOUNTER — Other Ambulatory Visit: Payer: Self-pay

## 2021-10-04 DIAGNOSIS — R69 Illness, unspecified: Secondary | ICD-10-CM | POA: Diagnosis not present

## 2021-10-04 DIAGNOSIS — F331 Major depressive disorder, recurrent, moderate: Secondary | ICD-10-CM

## 2021-10-04 DIAGNOSIS — F4310 Post-traumatic stress disorder, unspecified: Secondary | ICD-10-CM
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
## 2021-10-15 ENCOUNTER — Telehealth (INDEPENDENT_AMBULATORY_CARE_PROVIDER_SITE_OTHER): Payer: Medicare HMO | Admitting: Psychiatry

## 2021-10-15 ENCOUNTER — Emergency Department (HOSPITAL_COMMUNITY)
Admission: EM | Admit: 2021-10-15 | Discharge: 2021-10-15 | Disposition: A | Discharge status: Home / Self Care | Payer: Medicare HMO | Attending: Emergency Medicine | Admitting: Emergency Medicine

## 2021-10-15 ENCOUNTER — Emergency Department (HOSPITAL_COMMUNITY): Payer: Medicare HMO

## 2021-10-15 ENCOUNTER — Encounter: Payer: Self-pay | Admitting: Psychiatry

## 2021-10-15 ENCOUNTER — Encounter (HOSPITAL_COMMUNITY): Payer: Self-pay

## 2021-10-15 ENCOUNTER — Other Ambulatory Visit: Payer: Self-pay

## 2021-10-15 DIAGNOSIS — M79601 Pain in right arm: Secondary | ICD-10-CM | POA: Insufficient documentation

## 2021-10-15 DIAGNOSIS — F331 Major depressive disorder, recurrent, moderate: Secondary | ICD-10-CM | POA: Diagnosis not present

## 2021-10-15 DIAGNOSIS — R079 Chest pain, unspecified: Secondary | ICD-10-CM | POA: Diagnosis not present

## 2021-10-15 DIAGNOSIS — R0789 Other chest pain: Secondary | ICD-10-CM | POA: Diagnosis not present

## 2021-10-15 DIAGNOSIS — R0602 Shortness of breath: Secondary | ICD-10-CM | POA: Insufficient documentation

## 2021-10-15 DIAGNOSIS — G4700 Insomnia, unspecified: Secondary | ICD-10-CM

## 2021-10-15 DIAGNOSIS — R202 Paresthesia of skin: Secondary | ICD-10-CM | POA: Diagnosis not present

## 2021-10-15 DIAGNOSIS — Z743 Need for continuous supervision: Secondary | ICD-10-CM | POA: Diagnosis not present

## 2021-10-15 DIAGNOSIS — R0902 Hypoxemia: Secondary | ICD-10-CM | POA: Diagnosis not present

## 2021-10-15 DIAGNOSIS — R69 Illness, unspecified: Secondary | ICD-10-CM | POA: Diagnosis not present

## 2021-10-15 DIAGNOSIS — I10 Essential (primary) hypertension: Secondary | ICD-10-CM | POA: Diagnosis not present

## 2021-10-15 DIAGNOSIS — F4310 Post-traumatic stress disorder, unspecified: Secondary | ICD-10-CM | POA: Diagnosis not present

## 2021-10-15 LAB — CBC
HCT: 44.3 % (ref 36.0–46.0)
Hemoglobin: 14.2 g/dL (ref 12.0–15.0)
MCH: 26.8 pg (ref 26.0–34.0)
MCHC: 32.1 g/dL (ref 30.0–36.0)
MCV: 83.7 fL (ref 80.0–100.0)
Platelets: 340 10*3/uL (ref 150–400)
RBC: 5.29 MIL/uL — ABNORMAL HIGH (ref 3.87–5.11)
RDW: 15 % (ref 11.5–15.5)
WBC: 6.1 10*3/uL (ref 4.0–10.5)
nRBC: 0 % (ref 0.0–0.2)

## 2021-10-15 LAB — BASIC METABOLIC PANEL
Anion gap: 9 (ref 5–15)
BUN: 17 mg/dL (ref 6–20)
CO2: 24 mmol/L (ref 22–32)
Calcium: 9.8 mg/dL (ref 8.9–10.3)
Chloride: 105 mmol/L (ref 98–111)
Creatinine, Ser: 0.61 mg/dL (ref 0.44–1.00)
GFR, Estimated: >60 mL/min (ref >60)
Glucose, Bld: 117 mg/dL — ABNORMAL HIGH (ref 70–99)
Potassium: 4.1 mmol/L (ref 3.5–5.1)
Sodium: 138 mmol/L (ref 135–145)

## 2021-10-15 LAB — I-STAT BETA HCG BLOOD, ED (MC, WL, AP ONLY): I-stat hCG, quantitative: <5 m[IU]/mL (ref <5)

## 2021-10-15 LAB — TROPONIN I (HIGH SENSITIVITY): Troponin I (High Sensitivity): 2 ng/L (ref <18)

## 2021-10-15 MED ORDER — PRAZOSIN HCL 2 MG PO CAPS: 2.0000 mg | ORAL_CAPSULE | Freq: Every day | ORAL | 0 refills | Status: DC

## 2021-10-15 MED ORDER — TRAZODONE HCL 50 MG PO TABS: 50.0000 mg | ORAL_TABLET | Freq: Every evening | ORAL | 1 refills | Status: DC | PRN

## 2021-10-15 MED ORDER — BUPROPION HCL ER (XL) 150 MG PO TB24: 150.0000 mg | ORAL_TABLET | Freq: Every day | ORAL | 1 refills | Status: DC

## 2021-10-15 MED ORDER — BUPROPION HCL ER (XL) 300 MG PO TB24: 300.0000 mg | ORAL_TABLET | Freq: Every day | ORAL | 1 refills | Status: DC

## 2021-10-15 NOTE — Patient Instructions (Signed)
1. Continue venlafaxine 187.5  mg daily 2. Continue bupropion 450 mg (300 mg + 150 mg) daily 3. Continue Abilify 10 mg daily  5. Continue prazosin 2 mg at night - monitor dizziness  6. Continue Trazodone 50 mg at night as needed for sleep 7. Next appointment: 1/3 at 10:30

## 2021-10-15 NOTE — ED Notes (Signed)
Pt turned in pt labels and stated they were leaving

## 2021-10-15 NOTE — ED Triage Notes (Addendum)
Pt presents with c/o right arm pain extending from her shoulder to her fingertips. Pt also c/o chest tightness and shortness of breath. Pt is diaphoretic for EMS. Pt given 50 mcg of fentanyl by EMS.

## 2021-10-16 ENCOUNTER — Other Ambulatory Visit: Payer: Self-pay

## 2021-10-16 ENCOUNTER — Ambulatory Visit (INDEPENDENT_AMBULATORY_CARE_PROVIDER_SITE_OTHER): Payer: Medicare HMO | Admitting: Family Medicine

## 2021-10-16 ENCOUNTER — Encounter: Payer: Self-pay | Admitting: Family Medicine

## 2021-10-16 DIAGNOSIS — M542 Cervicalgia: Secondary | ICD-10-CM

## 2021-10-16 DIAGNOSIS — E010 Iodine-deficiency related diffuse (endemic) goiter: Secondary | ICD-10-CM

## 2021-10-16 MED ORDER — METFORMIN HCL ER 500 MG PO TB24: 1000.0000 mg | ORAL_TABLET | Freq: Two times a day (BID) | ORAL | 1 refills | Status: DC

## 2021-10-16 MED ORDER — CYCLOBENZAPRINE HCL 5 MG PO TABS: 5.0000 mg | ORAL_TABLET | Freq: Two times a day (BID) | ORAL | 0 refills | Status: DC | PRN

## 2021-10-16 NOTE — Progress Notes (Signed)
    CHIEF COMPLAINT / HPI:   Right shoulder and posterior neck pain.  He was seen in the emergency department yesterday but after she had some blood work and testing done she did not stay and left AMA.  She thinks the pain may be coming from her neck.  Has had problems with chronic neck pain off and on for years.  Previously had some muscle relaxers that helped with this.  Right now her pain is 10 out of 10.  She has been covering it with a blanket and took some over-the-counter NSAIDs this morning.  Not getting much better.  Does not radiate into her arm, no arm weakness.  PERTINENT  PMH / PSH: I have reviewed the patient's medications, allergies, past medical and surgical history, smoking status and updated in the EMR as appropriate.   OBJECTIVE:  BP 116/64   Pulse 72   Wt 234 lb 3.2 oz (106.2 kg)   SpO2 97%   BMI 38.55 kg/m  GENERAL: Well-developed female no acute distress Neck: Full Range Of Motion.  She Has Tenderness Palpation in the Right Trapezius and Right Sided Posterior Neck Muscles.  Cervical Vertebra Nontender to Palpation.  Enlarged thyroid is very mildly tender to palpation right sided enlargement greater than left. MSK: Full range of motion right shoulder although she has pain with forward flexion above 90 degrees.  ASSESSMENT / PLAN:   Acute neck pain Long discussion, part of her concern is that this may be related to her thyromegaly.  I do not think there is any relation to that she seems somewhat relief.  I will refill her muscle relaxers and have her follow-up with her PCP.  Thyromegaly She has an appointment in the next 7 days with surgeon to evaluate thyroid.   Dorcas Mcmurray MD

## 2021-10-16 NOTE — Assessment & Plan Note (Signed)
Long discussion, part of her concern is that this may be related to her thyromegaly.  I do not think there is any relation to that she seems somewhat relief.  I will refill her muscle relaxers and have her follow-up with her PCP.

## 2021-10-16 NOTE — Patient Instructions (Signed)
I think the pain in your neck and right shoulder is not related to your thyroid.  It is some muscle spasm.  I have refilled your prior prescription for the muscle relaxers.  Take them as directed.  You could also consider some light massage and/or heat.  This should self resolve in the next few days.  If it does not or you have new or worsening symptoms, please let us know.  Additionally I have refilled your metformin for you.  Please keep your follow-up appointment with your PCP.  Nice to meet you!Marland Kitchen

## 2021-10-16 NOTE — Assessment & Plan Note (Signed)
She has an appointment in the next 7 days with surgeon to evaluate thyroid.

## 2021-10-18 ENCOUNTER — Ambulatory Visit (INDEPENDENT_AMBULATORY_CARE_PROVIDER_SITE_OTHER): Payer: Medicare HMO | Admitting: Psychiatry

## 2021-10-18 ENCOUNTER — Other Ambulatory Visit: Payer: Self-pay

## 2021-10-18 DIAGNOSIS — F331 Major depressive disorder, recurrent, moderate: Secondary | ICD-10-CM | POA: Diagnosis not present

## 2021-10-18 DIAGNOSIS — F4310 Post-traumatic stress disorder, unspecified: Secondary | ICD-10-CM | POA: Diagnosis not present

## 2021-10-18 DIAGNOSIS — R69 Illness, unspecified: Secondary | ICD-10-CM | POA: Diagnosis not present
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
## 2021-10-23 ENCOUNTER — Ambulatory Visit: Payer: Medicare HMO

## 2021-10-24 DIAGNOSIS — E049 Nontoxic goiter, unspecified: Secondary | ICD-10-CM | POA: Diagnosis not present

## 2021-10-24 DIAGNOSIS — E042 Nontoxic multinodular goiter: Secondary | ICD-10-CM | POA: Diagnosis not present

## 2021-10-26 ENCOUNTER — Other Ambulatory Visit: Payer: Self-pay | Admitting: Surgery

## 2021-10-26 DIAGNOSIS — E049 Nontoxic goiter, unspecified: Secondary | ICD-10-CM

## 2021-10-26 DIAGNOSIS — E042 Nontoxic multinodular goiter: Secondary | ICD-10-CM

## 2021-11-01 ENCOUNTER — Ambulatory Visit
Admission: RE | Admit: 2021-11-01 | Discharge: 2021-11-01 | Disposition: A | Discharge status: Home / Self Care | Payer: Medicare HMO | Source: Ambulatory Visit | Attending: Surgery | Admitting: Surgery

## 2021-11-01 ENCOUNTER — Ambulatory Visit (HOSPITAL_COMMUNITY): Payer: Medicare HMO | Admitting: Psychiatry

## 2021-11-01 DIAGNOSIS — E042 Nontoxic multinodular goiter: Secondary | ICD-10-CM

## 2021-11-01 DIAGNOSIS — E041 Nontoxic single thyroid nodule: Secondary | ICD-10-CM | POA: Diagnosis not present

## 2021-11-01 DIAGNOSIS — E049 Nontoxic goiter, unspecified: Secondary | ICD-10-CM

## 2021-11-03 ENCOUNTER — Other Ambulatory Visit: Payer: Self-pay | Admitting: Family Medicine

## 2021-11-13 ENCOUNTER — Other Ambulatory Visit: Payer: Self-pay

## 2021-11-13 ENCOUNTER — Ambulatory Visit (INDEPENDENT_AMBULATORY_CARE_PROVIDER_SITE_OTHER): Payer: Medicare HMO | Admitting: Psychiatry

## 2021-11-13 DIAGNOSIS — F4310 Post-traumatic stress disorder, unspecified: Secondary | ICD-10-CM | POA: Diagnosis not present

## 2021-11-13 DIAGNOSIS — R69 Illness, unspecified: Secondary | ICD-10-CM | POA: Diagnosis not present

## 2021-11-13 DIAGNOSIS — F331 Major depressive disorder, recurrent, moderate: Secondary | ICD-10-CM
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
## 2021-11-14 ENCOUNTER — Telehealth: Payer: Self-pay

## 2021-11-14 ENCOUNTER — Telehealth (HOSPITAL_COMMUNITY): Payer: Self-pay

## 2021-11-14 MED ORDER — BENZONATATE 100 MG PO CAPS: 100.0000 mg | ORAL_CAPSULE | Freq: Two times a day (BID) | ORAL | 0 refills | Status: DC | PRN

## 2021-11-14 NOTE — Telephone Encounter (Signed)
Please let her know I sent in Jenny [name] perles for cough  If not helping or worsening would need to be seen at [facility] or next week here.

## 2021-11-14 NOTE — Telephone Encounter (Signed)
Pt informed. Macrae Wiegman, CMA  

## 2021-11-14 NOTE — Telephone Encounter (Signed)
Noted, thanks!

## 2021-11-14 NOTE — Telephone Encounter (Signed)
Patient's daughter calls nurse line regarding cough. Onset last Thursday, 11/17. Daughter reports productive cough with thick white/yellow mucus. Denies fever, body aches, sore throat, runny nose.   Patient has tried OTC sudafed, nyquil and mucinex. Reports negative home COVID test.   Daughter reports that last night patient was not able to sleep due to cough. Daughter is requesting cough medication and possible antibiotic. Advised that she will likely need to be seen prior to antibiotics being prescribed, however, I will ask Dr. Erin Hearing.   We do not have any appointments until next Tuesday.   Please advise.   Talbot Grumbling, RN

## 2021-11-27 ENCOUNTER — Ambulatory Visit (INDEPENDENT_AMBULATORY_CARE_PROVIDER_SITE_OTHER): Payer: Medicare HMO | Admitting: Psychiatry

## 2021-11-27 ENCOUNTER — Other Ambulatory Visit: Payer: Self-pay

## 2021-11-27 ENCOUNTER — Encounter (HOSPITAL_COMMUNITY): Payer: Self-pay

## 2021-11-27 DIAGNOSIS — F4310 Post-traumatic stress disorder, unspecified: Secondary | ICD-10-CM

## 2021-11-27 DIAGNOSIS — R69 Illness, unspecified: Secondary | ICD-10-CM | POA: Diagnosis not present

## 2021-11-27 DIAGNOSIS — F331 Major depressive disorder, recurrent, moderate: Secondary | ICD-10-CM | POA: Diagnosis not present
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
## 2021-11-27 NOTE — Plan of Care (Signed)
Patient participated in development of plan

## 2021-12-07 ENCOUNTER — Telehealth (HOSPITAL_COMMUNITY): Payer: Self-pay | Admitting: *Deleted

## 2021-12-07 NOTE — Telephone Encounter (Signed)
Opened in Error.

## 2021-12-11 ENCOUNTER — Ambulatory Visit (HOSPITAL_COMMUNITY): Payer: Medicare HMO | Admitting: Psychiatry
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
## 2021-12-25 ENCOUNTER — Other Ambulatory Visit: Payer: Self-pay

## 2021-12-25 ENCOUNTER — Encounter: Payer: Self-pay | Admitting: Psychiatry

## 2021-12-25 ENCOUNTER — Telehealth (INDEPENDENT_AMBULATORY_CARE_PROVIDER_SITE_OTHER): Payer: Medicare PPO | Admitting: Psychiatry

## 2021-12-25 DIAGNOSIS — F4310 Post-traumatic stress disorder, unspecified: Secondary | ICD-10-CM | POA: Diagnosis not present

## 2021-12-25 DIAGNOSIS — F330 Major depressive disorder, recurrent, mild: Secondary | ICD-10-CM | POA: Diagnosis not present

## 2021-12-25 MED ORDER — PRAZOSIN HCL 2 MG PO CAPS: 2.0000 mg | ORAL_CAPSULE | Freq: Every day | ORAL | 8 refills | Status: DC

## 2021-12-25 MED ORDER — ARIPIPRAZOLE 10 MG PO TABS: 10.0000 mg | ORAL_TABLET | Freq: Every day | ORAL | 0 refills | Status: DC

## 2021-12-25 MED ORDER — VENLAFAXINE HCL ER 150 MG PO CP24: 150.0000 mg | ORAL_CAPSULE | Freq: Every day | ORAL | 1 refills | Status: DC

## 2021-12-25 NOTE — Patient Instructions (Signed)
1. Continue venlafaxine 187.5  mg daily  2. Continue bupropion 450 mg (300 mg + 150 mg) daily 3. Continue Abilify 10 mg daily  5. Continue prazosin 2 mg at night - monitor dizziness  6. Continue Trazodone 50 mg at night as needed for sleep 7. Next appointment: 2/27 at 3:30

## 2021-12-26 ENCOUNTER — Ambulatory Visit (INDEPENDENT_AMBULATORY_CARE_PROVIDER_SITE_OTHER): Payer: Medicare PPO | Admitting: Psychiatry

## 2021-12-26 DIAGNOSIS — F4310 Post-traumatic stress disorder, unspecified: Secondary | ICD-10-CM | POA: Diagnosis not present

## 2021-12-26 DIAGNOSIS — F330 Major depressive disorder, recurrent, mild: Secondary | ICD-10-CM
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
## 2022-01-09 ENCOUNTER — Other Ambulatory Visit: Payer: Self-pay

## 2022-01-09 ENCOUNTER — Ambulatory Visit (INDEPENDENT_AMBULATORY_CARE_PROVIDER_SITE_OTHER): Payer: Medicare PPO | Admitting: Psychiatry

## 2022-01-09 DIAGNOSIS — F4310 Post-traumatic stress disorder, unspecified: Secondary | ICD-10-CM

## 2022-01-09 DIAGNOSIS — F330 Major depressive disorder, recurrent, mild: Secondary | ICD-10-CM
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
## 2022-01-14 ENCOUNTER — Other Ambulatory Visit: Payer: Self-pay | Admitting: Family Medicine

## 2022-01-23 ENCOUNTER — Other Ambulatory Visit: Payer: Self-pay

## 2022-01-23 ENCOUNTER — Ambulatory Visit (HOSPITAL_COMMUNITY): Payer: Medicare HMO | Admitting: Psychiatry

## 2022-01-30 ENCOUNTER — Ambulatory Visit: Payer: Medicare PPO | Admitting: Family Medicine

## 2022-02-04 NOTE — Progress Notes (Signed)
° ° °  SUBJECTIVE:   CHIEF COMPLAINT / HPI:   Diabetes Has been having more binge eating in the last few months.  No low blood sugar.  Has occaisional yeast infection keeps under control with monistat  Neck Pain Thyromegaly  Has appointment to see surgeon.  Is overall doing ok  PERTINENT  PMH / PSH: Seeing Dr Armandina Stammer for mental health  OBJECTIVE:   BP 125/70    Pulse 70    Wt 245 lb 9.6 oz (111.4 kg)    SpO2 98%    BMI 40.43 kg/m   Extremities:  No cyanosis, edema, or deformity noted with good range of motion of all major joints.     ASSESSMENT/PLAN:   Type 2 diabetes mellitus without complications (HCC) Worsening.   She would like to work on diet and exercise.  Discussed [place], MD [place]

## 2022-02-04 NOTE — Patient Instructions (Signed)
Good to see you today - Thank you for coming in  Things we discussed today:  Diabetes  Your A1c is up a little We can consider a GLP-1 like Ozempic as an injection once a week We will discuss more next vist if your A1c is not better   You need an diabetes eye exam every year.  Please see your eye doctor.  Ask them to fax [name] a report of your exam   I sent a prescription to your pharmacy for your Zoster vaccines to help prevent Shingles.  The shot may cause a sore arm and mild flu like symptoms for a few days.  You will need a second shot 2 months after your first.    Please always bring your medication bottles  Come back to see me in 3 months

## 2022-02-05 ENCOUNTER — Ambulatory Visit: Payer: Medicare PPO | Admitting: Family Medicine

## 2022-02-05 ENCOUNTER — Other Ambulatory Visit: Payer: Self-pay

## 2022-02-05 ENCOUNTER — Encounter: Payer: Self-pay | Admitting: Family Medicine

## 2022-02-05 VITALS — BP 125/70 | HR 70 | Wt 245.6 lb

## 2022-02-05 DIAGNOSIS — E119 Type 2 diabetes mellitus without complications: Secondary | ICD-10-CM

## 2022-02-05 LAB — POCT GLYCOSYLATED HEMOGLOBIN (HGB A1C): HbA1c, POC (controlled diabetic range): 8.1 % — AB (ref 0.0–7.0)

## 2022-02-05 NOTE — Assessment & Plan Note (Signed)
Worsening.   She would like to work on diet and exercise.  Discussed GLP1s

## 2022-02-06 ENCOUNTER — Ambulatory Visit (INDEPENDENT_AMBULATORY_CARE_PROVIDER_SITE_OTHER): Payer: Medicare PPO | Admitting: Psychiatry

## 2022-02-06 DIAGNOSIS — F330 Major depressive disorder, recurrent, mild: Secondary | ICD-10-CM | POA: Diagnosis not present

## 2022-02-06 DIAGNOSIS — F4310 Post-traumatic stress disorder, unspecified: Secondary | ICD-10-CM

## 2022-02-06 LAB — MICROALBUMIN / CREATININE URINE RATIO
Creatinine, Urine: 75.3 mg/dL
Microalb/Creat Ratio: 31 mg/g creat — ABNORMAL HIGH (ref 0–29)
Microalbumin, Urine: 23.3 ug/mL
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
## 2022-02-11 ENCOUNTER — Ambulatory Visit: Payer: Medicare PPO | Admitting: Neurology

## 2022-02-11 ENCOUNTER — Encounter: Payer: Self-pay | Admitting: Neurology

## 2022-02-11 VITALS — BP 138/85 | HR 59 | Ht 64.0 in | Wt 243.5 lb

## 2022-02-11 DIAGNOSIS — E66813 Obesity, class 3: Secondary | ICD-10-CM | POA: Insufficient documentation

## 2022-02-11 DIAGNOSIS — R351 Nocturia: Secondary | ICD-10-CM | POA: Diagnosis not present

## 2022-02-11 DIAGNOSIS — G893 Neoplasm related pain (acute) (chronic): Secondary | ICD-10-CM | POA: Diagnosis not present

## 2022-02-11 DIAGNOSIS — G4719 Other hypersomnia: Secondary | ICD-10-CM | POA: Insufficient documentation

## 2022-02-11 DIAGNOSIS — R5383 Other fatigue: Secondary | ICD-10-CM | POA: Insufficient documentation

## 2022-02-11 NOTE — Patient Instructions (Signed)

## 2022-02-11 NOTE — Progress Notes (Signed)
SLEEP MEDICINE CLINIC    Provider:  Larey Seat, MD  Primary Care Physician:  Lind Covert, [address] [place] [address]     Referring Provider: Modesta Messing, MD      Chief Complaint according to patient   Patient presents with:     New Patient (Initial Visit)           HISTORY OF PRESENT ILLNESS:  Jenny [name] is a 61 y.o. African American female patient seen here in a consultation requested by hr psychiatrist, Dr [name], MD on 02/11/2022  for an evaluation of excessive daytime  sleepiness. .  Chief concern according to patient :  " I had a sleep study in 2019, HST, at [facility] Dr. Gayla Doss , negative for apnea. My husband tells me I snore and sometimes stop breathing, gasping"     Jenny [name]  has a past medical history of Anemia, anxiety about health, depression, Cancer ([place]), Diabetes mellitus, type 2 ([place]), and Enlarged thyroid. Obesity. Colon Cancer was diagnosed in 2015 and treated by dr Burr Medico. Surgery 2016. Now cancer free.      Sleep relevant medical history: Nocturia 3-5 times (!) , Thyroid goiter , surgery is planned for this year, 2023. Tonsillectomy at age 86 , cervical spine surgery for DDD,  anterior fusion.   Family medical /sleep history: no  other family member on CPAP with OSA, insomnia, sleep walkers.    Social history:  Patient is retired from IT sales professional school, [employer],  and lives in a household with husband. Family status is married with 3 adopted children, these are her biological grandchildren.  She has a total of 5 grandchildren.  Pets are not present. Tobacco use; none .  ETOH use ;none ,  Caffeine intake in form of Coffee( 3-4 a week) Soda( 1 a week) Tea ( 2 a week) . Regular exercise in form of walking.        Sleep habits are as follows: The patient's dinner time is between 6 PM. The patient goes to bed at 9.30 PM and continues to struggle to sleep for hours, wakes for many bathroom breaks, the first time at  12 AM.   The preferred sleep position is supine due to body pain, with the support of 2-3 pillows. Dreams are reportedly rare/ infrequent.  6.30  AM is the usual rise time. The patient wakes up with her husband. . She reports not feeling refreshed or restored in AM, with symptoms such as dry mouth, morning headaches, and residual fatigue.  Naps are taken frequently, by lunch time -lasting from 30 to 45 minutes and are more refreshing than nocturnal sleep.    Review of Systems: Out of a complete 14 system review, the patient complains of only the following symptoms, and all other reviewed systems are negative.:  Fatigue, sleepiness , snoring, fragmented sleep, Insomnia, naps. Hypersomnia in daytime.   She reports chronic neuropathy from cancer treatment/    How likely are you to doze in the following situations: 0 = not likely, 1 = slight chance, 2 = moderate chance, 3 = high chance   Sitting and Reading? Watching Television? Sitting inactive in a public place (theater or meeting)? As a passenger in a car for an hour without a break? Lying down in the afternoon when circumstances permit? Sitting and talking to someone? Sitting quietly after lunch without alcohol? In a car, while stopped for a few minutes in traffic?   Total =  18/ 24 points   FSS endorsed at 48/ 63 points.   Social History   Socioeconomic History   Marital status: Married    Spouse name: Jenny [name]   Number of children: 3   Years of education: Not on file   Highest education level: Bachelor's degree (e.g., BA, AB, BS)  Occupational History   Not on file  Tobacco Use   Smoking status: Never   Smokeless tobacco: Never  Vaping Use   Vaping Use: Never used  Substance and Sexual Activity   Alcohol use: No   Drug use: No   Sexual activity: Yes    Birth control/protection: Surgical  Other Topics Concern   Not on file  Social History Narrative   Lives with husband and 3 children   Right handed   Caffeine: 4x a  week   Social Determinants of Health   Financial Resource Strain: Not on file  Food Insecurity: Not on file  Transportation Needs: Not on file  Physical Activity: Not on file  Stress: Not on file  Social Connections: Not on file    Family History  Problem Relation Age of Onset   Cancer Brother    Depression Brother    Cancer Brother    Hypertension Mother    Colon cancer Neg Hx     Past Medical History:  Diagnosis Date   Anemia    Cancer ([place]) 2015 and surgery 2016 .   colon cancer    Diabetes mellitus, type 2 (HCC)    Enlarged thyroid  Obesity  Chronic pain   Chronic fatigue.   Nocturia      Past Surgical History:  Procedure Laterality Date   BOWEL RESECTION     CERVICAL SPINE SURGERY  06/17/2012   C5-C7 ACDF   CESAREAN SECTION     PARTIAL HYSTERECTOMY     PORT-A-CATH REMOVAL Left 07/28/2015   Procedure: REMOVAL PORT-A-CATH;  Surgeon: [name], MD;  Location: [facility];  Service: General;  Laterality: Left;   PORTACATH PLACEMENT Left 12/22/2014   Procedure: INSERTION PORT-A-CATH LEFT SUBCLAVIAN;  Surgeon: [name], MD;  Location: [facility];  Service: General;  Laterality: Left;   TONSILLECTOMY       Current Outpatient Medications on File Prior to Visit  Medication Sig Dispense Refill   ARIPiprazole (ABILIFY) 10 MG tablet Take 1 tablet (10 mg total) by mouth daily. 90 tablet 0   atorvastatin (LIPITOR) 20 MG tablet Take 1 tablet (20 mg total) by mouth daily. 90 tablet 3   benzonatate (TESSALON) 100 MG capsule Take 1 capsule (100 mg total) by mouth 2 (two) times daily as needed for cough. 20 capsule 0   buPROPion (WELLBUTRIN XL) 150 MG 24 hr tablet Take 1 tablet (150 mg total) by mouth daily. Take total of 450 mg daily (300 mg + 150 mg) 90 tablet 1   buPROPion (WELLBUTRIN XL) 300 MG 24 hr tablet Take 1 tablet (300 mg total) by mouth daily. Take total of 450 mg daily, along with 150 mg daily 90 tablet 1   cyclobenzaprine (FLEXERIL) 5 MG tablet TAKE 1 TABLET BY  MOUTH 2 TIMES DAILY AS NEEDED FOR MUSCLE SPASMS. 30 tablet 0   diclofenac Sodium (VOLTAREN) 1 % GEL Apply 2 g topically 4 (four) times daily. 150 g 4   empagliflozin (JARDIANCE) 10 MG TABS tablet TAKE 1 TABLET BY MOUTH EVERY DAY 90 tablet 0   fluticasone (FLONASE) 50 MCG/ACT nasal spray Place 2 sprays into both nostrils daily. 16 g  2   gabapentin (NEURONTIN) 300 MG capsule Take 2-3 capsules before bed 270 capsule 1   hydrocortisone cream 0.5 % Apply 1 application topically 2 (two) times daily as needed for itching.     ibuprofen (ADVIL) 600 MG tablet Take 1 tablet (600 mg total) by mouth every 6 (six) hours as needed. 60 tablet 0   loratadine (CLARITIN) 10 MG tablet Take 1 tablet (10 mg total) by mouth daily. AS NEEDED 90 tablet 1   metFORMIN (GLUCOPHAGE-XR) 500 MG 24 hr tablet Take 2 tablets (1,000 mg total) by mouth 2 (two) times daily. 480 tablet 1   Multiple Vitamin (MULTIVITAMIN) tablet Take 1 tablet by mouth daily.     prazosin (MINIPRESS) 2 MG capsule Take 1 capsule (2 mg total) by mouth at bedtime. 90 capsule 8   traZODone (DESYREL) 50 MG tablet Take 1 tablet (50 mg total) by mouth at bedtime as needed for sleep. 90 tablet 1   venlafaxine XR (EFFEXOR-XR) 150 MG 24 hr capsule Take 1 capsule (150 mg total) by mouth daily. Total of 187.5 mg daily. Take along with 37.5 mg cap 90 capsule 1   venlafaxine XR (EFFEXOR-XR) 37.5 MG 24 hr capsule TAKE 1 CAPSULE BY MOUTH DAILY ALONG WITH THE 150 MG FOR TOTAL DAILY DOSE OF 187.5 MG 90 capsule 1   No current facility-administered medications on file prior to visit.    Allergies  Allergen Reactions   Other Rash    *Derma Bond*   Shellfish Allergy Rash    Physical exam:  Today's Vitals   02/11/22 0843  BP: 138/85  Pulse: (!) 59  Weight: 243 lb 8 oz (110.5 kg)  Height: 5\' 4"  (1.626 m)   Body mass index is 41.8 kg/m.   Wt Readings from Last 3 Encounters:  02/11/22 243 lb 8 oz (110.5 kg)  02/05/22 245 lb 9.6 oz (111.4 kg)  10/16/21 234  lb 3.2 oz (106.2 kg)     Ht Readings from Last 3 Encounters:  02/11/22 5\' 4"  (1.626 m)  09/27/20 5\' 4"  (1.626 m)  05/31/20 5\' 3"  (1.6 m)      General: The patient is awake, alert and appears not in acute distress. The patient is well groomed. Head: Normocephalic, atraumatic. Neck is supple. Mallampati 3,  neck circumference:17 inches . Nasal airflow  patent.  Retrognathia is not seen.  Dental status: biological  Cardiovascular:  Regular rate and cardiac rhythm by pulse,  without distended neck veins. Respiratory: Lungs are clear to auscultation.  Skin:  Without evidence of ankle edema, or rash. Trunk: The patient's posture is stooped.    Neurologic exam : The patient is awake and alert, oriented to place and time.   Memory subjective described as intact.  Attention span & concentration ability appears normal.  Speech is fluent,  without  dysarthria, dysphonia or aphasia.  Mood and affect are appropriate.   Cranial nerves: no loss of smell or taste reported  Pupils are equal and briskly reactive to light. Funduscopic exam deferred. .  Extraocular movements in vertical and horizontal planes were intact and without nystagmus. No Diplopia. Visual fields by finger perimetry are intact. Hearing was intact to soft voice and finger rubbing.    Facial sensation intact to fine touch.  Facial motor strength is symmetric and tongue and uvula move midline.  Neck ROM : pain with rotation and nodding- rotation, tilt and flexion extension were restricted. shoulder shrug was asymmetrical, left side in pain, restricting ROM.  Motor exam:  Symmetric bulk, tone and ROM.   Normal tone without cog -wheeling, symmetric grip strength .   Sensory:  Fine touch and vibration were impaired on the dermatome C 5-6 , numbness in both hands.  Proprioception tested in the upper extremities was normal. Reports numbness in both feet, toes burning-. Pin and needle.    Coordination: Rapid alternating  movements in the fingers/hands were of normal speed.  The Finger-to-nose maneuver was intact without evidence of ataxia, dysmetria or tremor.   Gait and station: Patient could rise unassisted from a seated position, walked without assistive device.  Stance is of normal width/ base and the patient turned with 4 steps.  She walks slowly. Toe and heel walk were deferred.  Deep tendon reflexes: in the  upper and lower extremities are symmetric and intact.  Babinski response was deferred        After spending a total time of  35  minutes face to face and additional time for physical and neurologic examination, review of laboratory studies,  personal review of imaging studies, reports and results of other testing and review of referral information / records as far as provided in visit, I have established the following assessments:  1) Mrs. Allnutt reports that she has had difficulties falling asleep and that nocturia interrupts her sleep frequently.  She has been in pain there is chronic discomfort ranging from the neck to shoulder areas radiating to both hands and there is also a pins and needle and burning dysesthesia in both feet.  She is status post anterior cervical fusion in 2013.  She was diagnosed with colon cancer in 2015 and a hemicolectomy in 2016 followed.  She has been followed by Dr. Modesta Messing the referring physician today for depression and anxiety.    Reviewing her medication list I think there are some that are definitely likely to make her more fatigued including Flexeril, Neurontin, also prazosin is a sleepy making blood pressure medicine, and she is on trazodone.  In daytime she uses Effexor and Wellbutrin concerned that the she will take half to take the Wellbutrin in the morning to get the more activating effect for the Effexor could be taken in the morning or evening.    As to trazodone -it is considered safe as a sleep inducing medication but it is not safe to be combined with  SSRIs.  My Plan is to proceed with:  1) recheck patient for OSA, nocturia may be related. We can use  2) pain in both legs and feet but not RLS- chronic pain treatment may be best insomnia treatment for this patient.  3) weight reduction can help with snoring reduction.    I would like to thank [name], MD and Lind Covert, [place],   [id number] for allowing me to meet with and to take care of this pleasant patient.   In short, JAZMEN LINDENBAUM is presenting with EDS ( excessive daytime sleepiness in the setting of depression, chronic pain and neuropathy  I plan to follow up either personally or through our NP within 2-4  months.   CC: I will share my notes with PCP as well.  Electronically signed by: Larey Seat, MD 02/11/2022 9:04 AM  [facility] Board certified by The [employer] of Sleep Medicine and Diplomate of the [employer] of Sleep Medicine. Board certified In Neurology through the [place], Fellow of the [employer] of Neurology. Medical Director  of [facility].
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
## 2022-02-14 ENCOUNTER — Other Ambulatory Visit: Payer: Self-pay

## 2022-02-14 ENCOUNTER — Emergency Department: Payer: Medicare PPO

## 2022-02-14 ENCOUNTER — Emergency Department
Admission: EM | Admit: 2022-02-14 | Discharge: 2022-02-14 | Disposition: A | Discharge status: Home / Self Care | Payer: Medicare PPO | Attending: Emergency Medicine | Admitting: Emergency Medicine

## 2022-02-14 DIAGNOSIS — S0990XA Unspecified injury of head, initial encounter: Secondary | ICD-10-CM | POA: Diagnosis not present

## 2022-02-14 DIAGNOSIS — S161XXA Strain of muscle, fascia and tendon at neck level, initial encounter: Secondary | ICD-10-CM | POA: Insufficient documentation

## 2022-02-14 DIAGNOSIS — S199XXA Unspecified injury of neck, initial encounter: Secondary | ICD-10-CM | POA: Diagnosis present

## 2022-02-14 DIAGNOSIS — S8002XA Contusion of left knee, initial encounter: Secondary | ICD-10-CM | POA: Insufficient documentation

## 2022-02-14 DIAGNOSIS — Y92410 Unspecified street and highway as the place of occurrence of the external cause: Secondary | ICD-10-CM | POA: Insufficient documentation

## 2022-02-14 MED ORDER — METHOCARBAMOL 500 MG PO TABS
1000.0000 mg | ORAL_TABLET | Freq: Once | ORAL | Status: AC
  Administered 2022-02-14: 1000 mg via ORAL
  Filled 2022-02-14: qty 2

## 2022-02-14 MED ORDER — NAPROXEN 500 MG PO TABS
500.0000 mg | ORAL_TABLET | Freq: Once | ORAL | Status: AC
  Administered 2022-02-14: 500 mg via ORAL
  Filled 2022-02-14: qty 1

## 2022-02-14 MED ORDER — METHOCARBAMOL 500 MG PO TABS
500.0000 mg | ORAL_TABLET | Freq: Four times a day (QID) | ORAL | 0 refills | Status: DC
Start: 2022-02-14 — End: 2023-02-26

## 2022-02-14 MED ORDER — MELOXICAM 15 MG PO TABS: 15.0000 mg | ORAL_TABLET | Freq: Every day | ORAL | 0 refills | Status: AC
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
## 2022-02-18 ENCOUNTER — Telehealth (INDEPENDENT_AMBULATORY_CARE_PROVIDER_SITE_OTHER): Payer: Medicare PPO | Admitting: Psychiatry

## 2022-02-18 ENCOUNTER — Other Ambulatory Visit: Payer: Self-pay

## 2022-02-18 ENCOUNTER — Encounter: Payer: Self-pay | Admitting: Psychiatry

## 2022-02-18 DIAGNOSIS — F330 Major depressive disorder, recurrent, mild: Secondary | ICD-10-CM | POA: Diagnosis not present

## 2022-02-18 DIAGNOSIS — F4310 Post-traumatic stress disorder, unspecified: Secondary | ICD-10-CM | POA: Diagnosis not present

## 2022-02-18 DIAGNOSIS — G4700 Insomnia, unspecified: Secondary | ICD-10-CM

## 2022-02-18 MED ORDER — VENLAFAXINE HCL ER 37.5 MG PO CP24
37.5000 mg | ORAL_CAPSULE | Freq: Every day | ORAL | 1 refills | Status: DC
Start: 2022-02-18 — End: 2022-08-07

## 2022-02-18 MED ORDER — ARIPIPRAZOLE 10 MG PO TABS: 10.0000 mg | ORAL_TABLET | Freq: Every day | ORAL | 1 refills | Status: DC

## 2022-02-18 NOTE — Addendum Note (Signed)
Addended by: [name] on: 02/18/2022 04:54 PM   Modules accepted: Orders

## 2022-02-18 NOTE — Patient Instructions (Signed)
Continue venlafaxine 187.5  mg daily  Continue bupropion 450 mg (300 mg + 150 mg) daily Continue Abilify 10 mg daily Continue prazosin 2 mg at night  Continue Trazodone 50 mg at night as needed for sleep Next appointment: 5/1 at 4:30

## 2022-02-20 ENCOUNTER — Ambulatory Visit (INDEPENDENT_AMBULATORY_CARE_PROVIDER_SITE_OTHER): Payer: Medicare PPO | Admitting: Psychiatry

## 2022-02-20 ENCOUNTER — Other Ambulatory Visit: Payer: Self-pay

## 2022-02-20 DIAGNOSIS — F4310 Post-traumatic stress disorder, unspecified: Secondary | ICD-10-CM

## 2022-02-20 DIAGNOSIS — F330 Major depressive disorder, recurrent, mild: Secondary | ICD-10-CM | POA: Diagnosis not present
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
## 2022-03-01 ENCOUNTER — Ambulatory Visit
Admission: RE | Admit: 2022-03-01 | Discharge: 2022-03-01 | Disposition: A | Discharge status: Home / Self Care | Payer: Medicare PPO | Attending: Chiropractor | Admitting: Chiropractor

## 2022-03-01 ENCOUNTER — Ambulatory Visit
Admission: RE | Admit: 2022-03-01 | Discharge: 2022-03-01 | Disposition: A | Discharge status: Home / Self Care | Payer: Medicare PPO | Source: Ambulatory Visit | Attending: Chiropractor | Admitting: Chiropractor

## 2022-03-01 ENCOUNTER — Other Ambulatory Visit: Payer: Self-pay | Admitting: Chiropractor

## 2022-03-01 DIAGNOSIS — S338XXA Sprain of other parts of lumbar spine and pelvis, initial encounter: Secondary | ICD-10-CM

## 2022-03-01 DIAGNOSIS — S233XXA Sprain of ligaments of thoracic spine, initial encounter: Secondary | ICD-10-CM | POA: Diagnosis present

## 2022-03-06 ENCOUNTER — Other Ambulatory Visit (INDEPENDENT_AMBULATORY_CARE_PROVIDER_SITE_OTHER): Payer: Medicare PPO | Admitting: Family Medicine

## 2022-03-06 ENCOUNTER — Other Ambulatory Visit: Payer: Self-pay | Admitting: Family Medicine

## 2022-03-06 ENCOUNTER — Encounter: Payer: Self-pay | Admitting: Family Medicine

## 2022-03-06 ENCOUNTER — Ambulatory Visit (INDEPENDENT_AMBULATORY_CARE_PROVIDER_SITE_OTHER): Payer: Medicare PPO | Admitting: Psychiatry

## 2022-03-06 ENCOUNTER — Ambulatory Visit (INDEPENDENT_AMBULATORY_CARE_PROVIDER_SITE_OTHER): Payer: Medicare PPO | Admitting: Family Medicine

## 2022-03-06 ENCOUNTER — Other Ambulatory Visit: Payer: Self-pay

## 2022-03-06 ENCOUNTER — Telehealth: Payer: Self-pay

## 2022-03-06 DIAGNOSIS — B349 Viral infection, unspecified: Secondary | ICD-10-CM | POA: Insufficient documentation

## 2022-03-06 DIAGNOSIS — F4310 Post-traumatic stress disorder, unspecified: Secondary | ICD-10-CM | POA: Diagnosis not present

## 2022-03-06 DIAGNOSIS — F330 Major depressive disorder, recurrent, mild: Secondary | ICD-10-CM | POA: Diagnosis not present

## 2022-03-06 LAB — POCT INFLUENZA A/B
Influenza A, POC: NEGATIVE
Influenza B, POC: NEGATIVE

## 2022-03-06 MED ORDER — AMOXICILLIN-POT CLAVULANATE 875-125 MG PO TABS: 1.0000 | ORAL_TABLET | Freq: Two times a day (BID) | ORAL | 0 refills | Status: AC

## 2022-03-06 NOTE — Telephone Encounter (Signed)
Patient's husband calls nurse line regarding appointment with Dr. Larae Grooms today. Husband is frustrated as patient has been using OTC medications and honey and cough is still persistent. Reports cough has been going on for approx 2 weeks.  ? ?Husband is requesting that patient get an antibiotic today, rather than waiting for two days. Requesting rx be sent to [facility] in [place].  ? ?Please advise.  ? ?Talbot Grumbling, RN ? ?
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
## 2022-03-06 NOTE — Telephone Encounter (Signed)
Patient's daughter calls nurse line regarding previous message. Informed of provider recommendations. Daughter reports that this is not going to work. States that mother is not functioning with pain radiating from chest to back and side and is unable to sleep due to cough.  ? ?Daughter is also asking why a chest X-ray was not ordered due to patient's symptoms.  ? ?Daughter is requesting to speak with provider to further discuss. Will forward to Dr. Larae Grooms.  ? ?Please return call to daughter at [phone]. ? ?Talbot Grumbling, RN ? ?

## 2022-03-06 NOTE — Assessment & Plan Note (Signed)
-  low concern for pneumonia, otitis media or other bacterial source given physical exam ?-supportive measures extensively discussed ?-pending COVID and influenza testing  ?-given duration of symptoms, instructed patient to call office in 2 days if symptoms worsen, will consider antibiotic prescription at that time ?-ED precautions discussed  ?

## 2022-03-06 NOTE — Progress Notes (Signed)
? ? ?  SUBJECTIVE:  ? ?CHIEF COMPLAINT / HPI:  ? ?Patient presents with productive cough with yellow phlegm, myalgias, chills and headaches. This has been occurring for over a week (8th day). She has been taking theraflu which helps her symptoms. She has noticed her symptoms slightly improving. Vaccinated against both COVID and flu. Denies fever, nausea and vomiting. She felt similar symptoms years ago which ended up being bronchitis, she is worried she has this which is why she presents to the clinic today. At that time, she did receive antibiotics which helped. No known sick contacts. Her symptoms get worse at night. She feels better when she takes the theraflu and when she sits up.  ? ? ?OBJECTIVE:  ? ?BP 120/78   Pulse 98   Wt 245 lb 3.2 oz (111.2 kg)   SpO2 98%   BMI 42.09 kg/m?   ?General: Patient tired appearing, in no acute distress. ?HEENT: presence of anterior cervical LAD, non-bulging TM bilaterally without erythema or drainage, normal buccal mucosa, moist mucous membranes ?CV: RRR, no murmurs or gallops auscultated ?Resp: CTAB with mild congestion noted, no wheezing, rales or rhonchi noted, air movement noted throughout all lung fields ? ?ASSESSMENT/PLAN:  ? ?Viral illness ?-low concern for pneumonia, otitis media or other bacterial source given physical exam ?-supportive measures extensively discussed ?-pending COVID and influenza testing  ?-given duration of symptoms, instructed patient to call office in 2 days if symptoms worsen, will consider antibiotic prescription at that time ?-ED precautions discussed  ? ? ?Donney Dice, DO ?[place]  ?

## 2022-03-06 NOTE — Plan of Care (Signed)
?  Problem: PTSD-Trauma Disorder CCP avoidant behaviors, thoughts of self-blame  Goal:  " I want to look at me differently and stop blaming myself for the things that happened to me" Outcome: Progressing Goal: LTG: Elimination of maladaptive behaviors and thinking patterns which interfere with resolution of trauma: AEB by pt reducing negative thoughts about self and  thoughts of self-blame for trauma history to 2 x or less per week for 4 consecutive weeks.  Outcome: Progressing Goal: STG: [place] WILL PRACTICE EMOTION REGULATION SKILLS 5 PER WEEK FOR THE NEXT 12 WEEKS Outcome: Progressing   

## 2022-03-06 NOTE — Patient Instructions (Addendum)
It was great seeing you today! ? ?Today we discussed your illness, I am so sorry that you are not feeling well! We got COVID and flu testing, even if both of these tests are negative, it still is likely that you have a viral infection. If you are feeling worse and having thick phlegm then please call our office in 2 days as I may prescribe an antibiotic for you. You do not seem to have a bacterial source for your infection, your lungs sound good and your ears look good. ? ?Please make sure to get plenty of rest and stay hydrated. Honey can help soothe your throat and help with the cough. Using a humidifier can help with the congestion. ? ?If you are having any difficulty breathing then please call our office or go to the emergency department.  ? ?Please follow up at your next scheduled appointment, if anything arises between now and then, please don't hesitate to contact our office. ? ? ?Thank you for allowing [name] to be a part of your medical care! ? ?Thank you, ?Dr. Larae Grooms  ?

## 2022-03-07 ENCOUNTER — Telehealth: Payer: Self-pay

## 2022-03-07 LAB — NOVEL CORONAVIRUS, NAA: SARS-CoV-2, NAA: NOT DETECTED

## 2022-03-07 NOTE — Telephone Encounter (Signed)
LVM for pt to call me back to schedule sleep study  

## 2022-03-14 ENCOUNTER — Telehealth: Payer: Self-pay

## 2022-03-14 NOTE — Telephone Encounter (Signed)
Pt will call back to schedule her sleep study ?

## 2022-03-19 ENCOUNTER — Telehealth: Payer: Self-pay

## 2022-03-19 NOTE — Telephone Encounter (Signed)
Returned pt's call and LVM for pt to call me back to schedule sleep study ° °

## 2022-03-20 ENCOUNTER — Other Ambulatory Visit: Payer: Self-pay

## 2022-03-20 ENCOUNTER — Ambulatory Visit (INDEPENDENT_AMBULATORY_CARE_PROVIDER_SITE_OTHER): Payer: Medicare PPO | Admitting: Psychiatry

## 2022-03-20 DIAGNOSIS — F4310 Post-traumatic stress disorder, unspecified: Secondary | ICD-10-CM | POA: Diagnosis not present
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
## 2022-03-28 ENCOUNTER — Ambulatory Visit (INDEPENDENT_AMBULATORY_CARE_PROVIDER_SITE_OTHER): Payer: Medicare PPO | Admitting: Neurology

## 2022-03-28 DIAGNOSIS — R5383 Other fatigue: Secondary | ICD-10-CM

## 2022-03-28 DIAGNOSIS — G4710 Hypersomnia, unspecified: Secondary | ICD-10-CM | POA: Diagnosis not present

## 2022-03-28 DIAGNOSIS — G893 Neoplasm related pain (acute) (chronic): Secondary | ICD-10-CM

## 2022-03-28 DIAGNOSIS — G4719 Other hypersomnia: Secondary | ICD-10-CM

## 2022-04-03 ENCOUNTER — Ambulatory Visit (INDEPENDENT_AMBULATORY_CARE_PROVIDER_SITE_OTHER): Payer: Medicare PPO | Admitting: Psychiatry

## 2022-04-03 DIAGNOSIS — F330 Major depressive disorder, recurrent, mild: Secondary | ICD-10-CM | POA: Diagnosis not present

## 2022-04-03 DIAGNOSIS — F4310 Post-traumatic stress disorder, unspecified: Secondary | ICD-10-CM | POA: Diagnosis not present
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
## 2022-04-05 ENCOUNTER — Emergency Department
Admission: EM | Admit: 2022-04-05 | Discharge: 2022-04-05 | Disposition: A | Discharge status: Home / Self Care | Payer: Medicare PPO | Attending: Emergency Medicine | Admitting: Emergency Medicine

## 2022-04-05 ENCOUNTER — Emergency Department: Payer: Medicare PPO

## 2022-04-05 ENCOUNTER — Encounter: Payer: Self-pay | Admitting: Emergency Medicine

## 2022-04-05 ENCOUNTER — Other Ambulatory Visit: Payer: Self-pay

## 2022-04-05 DIAGNOSIS — R42 Dizziness and giddiness: Secondary | ICD-10-CM | POA: Diagnosis not present

## 2022-04-05 DIAGNOSIS — E119 Type 2 diabetes mellitus without complications: Secondary | ICD-10-CM | POA: Diagnosis not present

## 2022-04-05 DIAGNOSIS — R9431 Abnormal electrocardiogram [ECG] [EKG]: Secondary | ICD-10-CM | POA: Diagnosis not present

## 2022-04-05 DIAGNOSIS — Z85038 Personal history of other malignant neoplasm of large intestine: Secondary | ICD-10-CM | POA: Diagnosis not present

## 2022-04-05 DIAGNOSIS — R110 Nausea: Secondary | ICD-10-CM | POA: Insufficient documentation

## 2022-04-05 DIAGNOSIS — R519 Headache, unspecified: Secondary | ICD-10-CM | POA: Diagnosis not present

## 2022-04-05 DIAGNOSIS — R5383 Other fatigue: Secondary | ICD-10-CM | POA: Insufficient documentation

## 2022-04-05 DIAGNOSIS — H814 Vertigo of central origin: Secondary | ICD-10-CM | POA: Diagnosis not present

## 2022-04-05 LAB — CBC WITH DIFFERENTIAL/PLATELET
Abs Immature Granulocytes: 0.01 10*3/uL (ref 0.00–0.07)
Basophils Absolute: 0 10*3/uL (ref 0.0–0.1)
Basophils Relative: 1 %
Eosinophils Absolute: 0.1 10*3/uL (ref 0.0–0.5)
Eosinophils Relative: 2 %
HCT: 41.1 % (ref 36.0–46.0)
Hemoglobin: 13.1 g/dL (ref 12.0–15.0)
Immature Granulocytes: 0 %
Lymphocytes Relative: 48 %
Lymphs Abs: 2.2 10*3/uL (ref 0.7–4.0)
MCH: 26.3 pg (ref 26.0–34.0)
MCHC: 31.9 g/dL (ref 30.0–36.0)
MCV: 82.4 fL (ref 80.0–100.0)
Monocytes Absolute: 0.2 10*3/uL (ref 0.1–1.0)
Monocytes Relative: 4 %
Neutro Abs: 2 10*3/uL (ref 1.7–7.7)
Neutrophils Relative %: 45 %
Platelets: 239 10*3/uL (ref 150–400)
RBC: 4.99 MIL/uL (ref 3.87–5.11)
RDW: 15 % (ref 11.5–15.5)
WBC: 4.5 10*3/uL (ref 4.0–10.5)
nRBC: 0 % (ref 0.0–0.2)

## 2022-04-05 LAB — BASIC METABOLIC PANEL
Anion gap: 8 (ref 5–15)
BUN: 15 mg/dL (ref 6–20)
CO2: 24 mmol/L (ref 22–32)
Calcium: 9.4 mg/dL (ref 8.9–10.3)
Chloride: 107 mmol/L (ref 98–111)
Creatinine, Ser: 0.57 mg/dL (ref 0.44–1.00)
GFR, Estimated: >60 mL/min (ref >60)
Glucose, Bld: 127 mg/dL — ABNORMAL HIGH (ref 70–99)
Potassium: 3.7 mmol/L (ref 3.5–5.1)
Sodium: 139 mmol/L (ref 135–145)

## 2022-04-05 LAB — TSH: TSH: 1.194 u[IU]/mL (ref 0.350–4.500)

## 2022-04-05 LAB — MAGNESIUM: Magnesium: 2.1 mg/dL (ref 1.7–2.4)

## 2022-04-05 MED ORDER — LACTATED RINGERS IV BOLUS
1000.0000 mL | Freq: Once | INTRAVENOUS | Status: AC
  Administered 2022-04-05: 1000 mL via INTRAVENOUS

## 2022-04-05 MED ORDER — PROCHLORPERAZINE EDISYLATE 10 MG/2ML IJ SOLN
10.0000 mg | Freq: Once | INTRAMUSCULAR | Status: AC
Start: 2022-04-05 — End: 2022-04-05
  Administered 2022-04-05: 10 mg via INTRAVENOUS
  Filled 2022-04-05: qty 2

## 2022-04-05 MED ORDER — PROCHLORPERAZINE MALEATE 10 MG PO TABS: 10.0000 mg | ORAL_TABLET | Freq: Four times a day (QID) | ORAL | 0 refills | Status: DC | PRN

## 2022-04-05 MED ORDER — MECLIZINE HCL 25 MG PO TABS
25.0000 mg | ORAL_TABLET | Freq: Three times a day (TID) | ORAL | 0 refills | Status: DC | PRN
Start: 2022-04-05 — End: 2022-12-19

## 2022-04-05 MED ORDER — MECLIZINE HCL 25 MG PO TABS
25.0000 mg | ORAL_TABLET | Freq: Once | ORAL | Status: AC
  Administered 2022-04-05: 25 mg via ORAL
  Filled 2022-04-05: qty 1

## 2022-04-05 MED ORDER — DIPHENHYDRAMINE HCL 50 MG/ML IJ SOLN
25.0000 mg | Freq: Once | INTRAMUSCULAR | Status: AC
  Administered 2022-04-05: 25 mg via INTRAVENOUS
  Filled 2022-04-05: qty 1

## 2022-04-05 NOTE — ED Notes (Signed)
Pt was assisted with dressing and cleaning up without incident.  ?

## 2022-04-05 NOTE — ED Notes (Signed)
See triage note  Presents with family    Daughter states she hit her head yesterday  now having left sided h/a and dizziness    positive nausea   states the room is spinning slightly   ?

## 2022-04-05 NOTE — ED Notes (Signed)
The pt called out and advised she needed assistance to the restroom. The pt was assisted to a sitting position on the bed. The pt advised she was dizzy and was placed back in the bed. The pt was placed on the purwick and she was instructed to keep her legs closed but she did not. The pt did urinate on her bed and clothing. A chuck was placed under the pt. The pt was advised we would assist her with changing after her dizziness settled.  ?

## 2022-04-05 NOTE — ED Provider Notes (Signed)
? ?[facility] ?Provider Note ? ? ? Event Date/Time  ? First MD Initiated Contact with Patient 04/05/22 1320   ?  (approximate) ? ? ?History  ? ?Chief Complaint ?Dizziness and Nausea ? ? ?HPI ? ?Jenny [name] is a 61 y.o. female with past medical history of hyperlipidemia, diabetes, colon cancer, major depressive disorder, and PTSD who presents to the ED complaining of headache and dizziness.  Patient reports that since last night she has been dealing with gradually worsening headache primarily affecting the left side of her head.  She describes it as a constant throbbing that is not exacerbated or alleviated by anything in particular.  She states she has felt dizzy with this, sometimes feeling like she is going to pass out and sometimes feeling like the room is spinning around her.  She has felt nauseous but denies any vomiting, denies any vision changes, speech changes, numbness, or weakness.  She does state that she feels much more fatigued than usual, denies any recent fevers, cough, chest pain, shortness of breath, or dysuria.  She reports similar headaches in the past, but never quite as severe. ?  ? ? ?Physical Exam  ? ?Triage Vital Signs: ?ED Triage Vitals  ?Enc Vitals Group  ?   BP 04/05/22 1323 (!) 157/91  ?   Pulse Rate 04/05/22 1323 63  ?   Resp 04/05/22 1323 16  ?   Temp 04/05/22 1323 98.5 ?F (36.9 ?C)  ?   Temp Source 04/05/22 1323 Oral  ?   SpO2 04/05/22 1323 95 %  ?   Weight 04/05/22 1318 245 lb 2.4 oz (111.2 kg)  ?   Height 04/05/22 1318 '5\' 4"'$  (1.626 m)  ?   Head Circumference --   ?   Peak Flow --   ?   Pain Score 04/05/22 1318 10  ?   Pain Loc --   ?   Pain Edu? --   ?   Excl. in [place]? --   ? ? ?Most recent vital signs: ?Vitals:  ? 04/05/22 1715 04/05/22 1730  ?BP:    ?Pulse: (!) 53 65  ?Resp:    ?Temp:    ?SpO2: 96% 97%  ? ? ?Constitutional: Alert and oriented. ?Eyes: Conjunctivae are normal.  Pupils equal, round, and reactive to light bilaterally. ?Head: Atraumatic. ?Nose:  No congestion/rhinnorhea. ?Mouth/Throat: Mucous membranes are moist.  ?Neck: Supple with no meningismus. ?Cardiovascular: Normal rate, regular rhythm. Grossly normal heart sounds.  2+ radial pulses bilaterally. ?Respiratory: Normal respiratory effort.  No retractions. Lungs CTAB. ?Gastrointestinal: Soft and nontender. No distention. ?Musculoskeletal: No lower extremity tenderness nor edema.  ?Neurologic:  Normal speech and language. No gross focal neurologic deficits are appreciated. ? ? ? ?ED Results / Procedures / Treatments  ? ?Labs ?(all labs ordered are listed, but only abnormal results are displayed) ?Labs Reviewed  ?BASIC METABOLIC PANEL - Abnormal; Notable for the following components:  ?    Result Value  ? Glucose, Bld 127 (*)   ? All other components within normal limits  ?CBC WITH DIFFERENTIAL/PLATELET  ?TSH  ?MAGNESIUM  ? ? ? ?EKG ? ?ED ECG REPORT ?Tempie Hoist, the attending physician, personally viewed and interpreted this ECG. ? ? Date: 04/05/2022 ? EKG Time: 13:24 ? Rate: 66 ? Rhythm: normal sinus rhythm ? Axis: Normal ? Intervals:none ? ST&T Change: None ? ?RADIOLOGY ?CT head images reviewed by me with no hemorrhage or midline shift. ? ?PROCEDURES: ? ?Critical Care performed: No ? ?  Procedures ? ? ?MEDICATIONS ORDERED IN ED: ?Medications  ?lactated ringers bolus 1,000 mL (1,000 mLs Intravenous New Bag/Given 04/05/22 1420)  ?prochlorperazine (COMPAZINE) injection 10 mg (10 mg Intravenous Given 04/05/22 1417)  ?diphenhydrAMINE (BENADRYL) injection 25 mg (25 mg Intravenous Given 04/05/22 1416)  ?meclizine (ANTIVERT) tablet 25 mg (25 mg Oral Given 04/05/22 1802)  ? ? ? ?IMPRESSION / MDM / ASSESSMENT AND PLAN / ED COURSE  ?I reviewed the triage vital signs and the nursing notes. ?             ?               ? ?61 y.o. female with past medical history of hyperlipidemia, diabetes, colon cancer, major depressive disorder, and PTSD who presents to the ED complaining of gradually worsening headache,  dizziness, fatigue, and nausea since yesterday evening. ? ?Differential diagnosis includes, but is not limited to, stroke, TIA, migraine, SAH, meningitis, electrolyte abnormality, hypothyroidism, and vertigo. ? ?Patient nontoxic-appearing and in no acute distress, vital signs are unremarkable and she has a nonfocal neurologic exam.  Symptoms sound most consistent with migraine and I have low suspicion for [facility] given gradually worsening headache, low suspicion for stroke given nonfocal neurologic exam.  No fever or meningismus noted on exam.  EKG shows no evidence of arrhythmia or ischemia and I doubt cardiac etiology.  We will screen labs including CBC, BMP, TSH, and UA.  Also plan to check CT head, treat symptomatically with IV Compazine and Benadryl. ? ?CT head is negative for acute process and labs are reassuring.  CBC shows no anemia or leukocytosis, BMP without AKI or electrolyte abnormality, TSH within normal limits.  On reassessment, patient reports that headache and nausea have improved, but she continues to feel dizzy upon standing.  There appears to be a component of peripheral vertigo but low suspicion for stroke given symptoms associated with positional change.  Patient was given a dose of meclizine with ongoing improvement in symptoms.  She is appropriate for discharge home with PCP follow-up, will be prescribed meclizine and Compazine for use as needed.  She was counseled to return to the ED for new worsening symptoms, patient and daughter agree with plan. ? ?  ? ? ?FINAL CLINICAL IMPRESSION(S) / ED DIAGNOSES  ? ?Final diagnoses:  ?Vertigo  ?Acute nonintractable headache, unspecified headache type  ? ? ? ?Rx / DC Orders  ? ?ED Discharge Orders   ? ?      Ordered  ?  meclizine (ANTIVERT) 25 MG tablet  3 times daily PRN       ? 04/05/22 1936  ?  prochlorperazine (COMPAZINE) 10 MG tablet  Every 6 hours PRN       ? 04/05/22 1936  ? ?  ?  ? ?  ? ? ? ?Note:  This document was prepared using Dragon voice  recognition software and may include unintentional dictation errors. ?  ?Blake Divine, MD ?04/05/22 1939 ? ?

## 2022-04-05 NOTE — ED Triage Notes (Signed)
Pt comes into the ED via POV c/o dizziness and nausea.  Per the daughter she has had the inability to stand today which is new.  Pt is diabetic but CBG has been around 130.  PT also c/o left eye pain that radiates into the temple.  Pt has even and unlabored respirations at this time.  ?

## 2022-04-05 NOTE — ED Notes (Signed)
E signature pad not working. Pt educated on discharge instructions and verbalized understanding.  

## 2022-04-08 ENCOUNTER — Telehealth: Payer: Self-pay | Admitting: *Deleted

## 2022-04-08 NOTE — Telephone Encounter (Signed)
Took call from phone staff and spoke with pt. Relayed results. Scheduled appt for 04/17/22 at 11:30am with Dr. Brett Fairy for her to come in and discuss next steps.  ?

## 2022-04-08 NOTE — Procedures (Signed)
PATIENT'S NAME:  Dhanvi, Boesen. ?DOB:      1961-11-09      ?MR#:    [id number]     ?DATE OF RECORDING: 03/28/2022 ?REFERRING M.D.:  [name], MD ?PCP : Dr Erin Hearing. ?Study Performed:   Baseline Polysomnogram ?HISTORY:  KAYSIE MICHELINI is a 61 y.o. African American female patient who was seen in consultation requested by her psychiatrist, Dr [name], MD, on 02/11/2022, for an evaluation of excessive daytime sleepiness. She had a sleep study in 2019, a HST which was negative for apnea.  ??My husband tells me I snore and sometimes stop breathing, gasping and I am very sleepy, all the time and wake up unrefreshed, sometimes with headaches and a dry mouth?. ? She reported Bathroom breaks up to 5 times at night (!), history of thyroid disease, cervical fusion, history of chemotherapy for cancer, leg pain but not RLS, obesity, depression, naps almost daily. ? ? ?The patient endorsed the Epworth Sleepiness Scale at 18/24 points.   ?The patient's weight 243 pounds with a height of 64 (inches), resulting in a BMI of 41.4 kg/m2. ?The patient's neck circumference measured 17 inches. ? ?CURRENT MEDICATIONS: Abilify, Lipitor, Tessalon, Wellbutrin, Flexeril, Voltaren, Jardiance, Flonase, Neurontin, hydrocortisone cream, Advil, Claritin, Glucophage, Multivitamin, Minipress, Desyrel, Effexor-XR ?  ?PROCEDURE:  This is a multichannel digital polysomnogram utilizing the Somnostar 11.2 system.  Electrodes and sensors were applied and monitored per AASM Specifications.   EEG, EOG, Chin and Limb EMG, were sampled at 200 Hz.  ECG, Snore and Nasal Pressure, Thermal Airflow, Respiratory Effort, CPAP Flow and Pressure, Oximetry was sampled at 50 Hz. Digital video and audio were recorded.     ? ?BASELINE STUDY: Lights Out was at 22:04 and Lights On at 05:35.  Total recording time (TRT) was 452 minutes, with a total sleep time (TST) of 312 minutes.   ? The patient's sleep latency was 65 minutes.  REM latency was 81 minutes.  The sleep  efficiency was 69.0%.  ?   ?SLEEP ARCHITECTURE: WASO (Wake after sleep onset) was 95 minutes.  There were 32.5 minutes in Stage N1, 208.5 minutes Stage N2, 8 minutes Stage N3 and 63 minutes in Stage REM.  The percentage of Stage N1 was 10.4%, Stage N2 was 66.8%, Stage N3 was 2.6% and Stage R (REM sleep) was 20.2%.  ? ?RESPIRATORY ANALYSIS:  There were a total of 22 respiratory events:  14 obstructive apneas, 1 central apneas and 1 mixed apnea with a total of 16 apneas and an apnea index (AI) of 3.1 /hour. There were 6 hypopneas with a hypopnea index of 1.2 /hour.  ?    ?The total APNEA/HYPOPNEA INDEX (AHI) was 4.2/hour.  4 events occurred in REM sleep and 9 events in NREM. The REM AHI was 3.8 /hour, versus a non-REM AHI of 4.3/h. The patient spent 130.5 minutes of total sleep time in the supine position and 182 minutes in non-supine. The supine AHI was 9.7 versus a non-supine AHI of 0.3. ? ?OXYGEN SATURATION & C02:  The Wake baseline 02 saturation was 95%, with the lowest being 82%. Time spent below 89% saturation equaled 3 minutes. ? ?The arousals were noted as: 24 were spontaneous, 6 were associated with PLMs, 12 were associated with respiratory events. ?The patient had a total of 28 Periodic Limb Movements.  The Periodic Limb Movement (PLM) Arousal index was 1.2/hour. ? ?Audio and video analysis did not show any abnormal or unusual movements, behaviors, phonations or vocalizations.   ?  EKG was in keeping with normal sinus rhythm (NSR). ? ? ?IMPRESSION: ? ?Mild sleep disordered breathing, not enough to diagnose sleep apnea, central or obstructive. These apnea and hypopneas were also clustered in the time between 4.40 AM and 5 AM.  ?Mild Periodic Limb Movement Disorder (PLMD), clustered in the time of recording as above.  ? ? ?RECOMMENDATIONS:  Since most respiratory events were hypopneas, it is likely that weight loss will help greatly to reduce these 'shallow breathing spells' . ?MSLT to be considered as we have  no explanation for excessive daytime sleepiness.  ? ? ? ?I certify that I have reviewed the entire raw data recording prior to the issuance of this report in accordance with the Standards of Accreditation of the [place] [employer] (AASM) ? ? ? ? ? ?Larey Seat, MD ?[place], [place] Board of Psychiatry and Neurology  ?Diplomat, Tax adviser of Sleep Medicine ?Medical Director, [facility] ? ? ? ? ? ?

## 2022-04-08 NOTE — Progress Notes (Signed)
IMPRESSION: ?? ?1. Mild sleep disordered breathing, not enough to diagnose sleep apnea, central or obstructive. These apnea and hypopneas were also clustered in the time between 4.40 AM and 5 AM.  ?2. Mild Periodic Limb Movement Disorder (PLMD), clustered in the time of recording as above.  ?? ?? ?RECOMMENDATIONS:  Since most respiratory events were hypopneas, it is likely that weight loss will help greatly to reduce these 'shallow breathing spells' . ?MSLT to be considered as we have no explanation for excessive daytime sleepiness.  ??

## 2022-04-08 NOTE — Progress Notes (Signed)
MSLT not yet ordered , we need to meet and address other possible causes first.

## 2022-04-08 NOTE — Telephone Encounter (Signed)
Called pt. Went straight to VM. LVM for pt to call about results. ?

## 2022-04-08 NOTE — Telephone Encounter (Signed)
-----   Message from Larey Seat, MD sent at 04/08/2022  9:11 AM EDT ----- ?IMPRESSION: ?? ?1. Mild sleep disordered breathing, not enough to diagnose sleep apnea, central or obstructive. These apnea and hypopneas were also clustered in the time between 4.40 AM and 5 AM.  ?2. Mild Periodic Limb Movement Disorder (PLMD), clustered in the time of recording as above.  ?? ?? ?RECOMMENDATIONS:  Since most respiratory events were hypopneas, it is likely that weight loss will help greatly to reduce these 'shallow breathing spells' . ?MSLT to be considered as we have no explanation for excessive daytime sleepiness.  ?? ?

## 2022-04-10 ENCOUNTER — Other Ambulatory Visit: Payer: Self-pay | Admitting: Family Medicine

## 2022-04-12 ENCOUNTER — Ambulatory Visit (HOSPITAL_COMMUNITY): Payer: Medicare PPO | Admitting: Psychiatry

## 2022-04-12 ENCOUNTER — Other Ambulatory Visit: Payer: Self-pay | Admitting: Family Medicine

## 2022-04-17 ENCOUNTER — Encounter: Payer: Self-pay | Admitting: Neurology

## 2022-04-17 ENCOUNTER — Ambulatory Visit: Payer: Medicare PPO | Admitting: Neurology

## 2022-04-17 ENCOUNTER — Ambulatory Visit (INDEPENDENT_AMBULATORY_CARE_PROVIDER_SITE_OTHER): Payer: Medicare PPO | Admitting: Psychiatry

## 2022-04-17 VITALS — BP 136/86 | HR 60 | Ht 64.0 in | Wt 239.5 lb

## 2022-04-17 DIAGNOSIS — G4719 Other hypersomnia: Secondary | ICD-10-CM | POA: Diagnosis not present

## 2022-04-17 DIAGNOSIS — G893 Neoplasm related pain (acute) (chronic): Secondary | ICD-10-CM | POA: Diagnosis not present

## 2022-04-17 DIAGNOSIS — F4310 Post-traumatic stress disorder, unspecified: Secondary | ICD-10-CM

## 2022-04-17 DIAGNOSIS — F330 Major depressive disorder, recurrent, mild: Secondary | ICD-10-CM

## 2022-04-17 NOTE — Progress Notes (Addendum)
? ? ?SLEEP MEDICINE CLINIC ?  ? ?Provider:  Larey Seat, MD  ?Primary Care Physician:  Jenny Covert, MD ?[address] ?[place] [address]  ? ?  ?Referring Provider: Modesta Messing, MD  ?    ?Chief Complaint according to patient   ?Patient presents with:  ?  ? New Patient (Initial Visit)  ?     ?  ?  ?HISTORY OF PRESENT ILLNESS:  ?Jenny [name] is a 61 y.o. African American female patient seen in a RV after sleep study on 04/17/2022: ?The patient's sleep study was a attended polysomnography in our sleep lab the date of recording was 4-6 2023.  The patient had endorsed the Epworth sleepiness score at 18 points prior to her sleep study her sleep latency was 65 minutes her REM latency was 81 minutes which was surprisingly short in combination with the medication she takes.  The overall sleep efficiency was poor at 69%.  She reached 20.2% REM sleep which also was surprising.  Her AHI was only 4.2/h she had only 3 minutes of oxygen desaturation for the whole night, the majority of her arousals from sleep were spontaneous not related to apnea or periodic limb movements.  The patient endorsed that she was actually in pain when she started moving and frequently arousing from sleep.  The AHI was too low to initiate any treatment for sleep disordered breathing I recommended that she pursue weight loss, I also stated that an MSLT should be considered as we have no explanation for the high degree of sleepiness and daytime and absence of sleep apnea.  For this reason we spoke today about the narcolepsy associated symptoms such as very vivid dreams frequent nocturnal interruptions of sleep due to vivid dreams, realistic and lucid dreams that appear like reality.  This she has not experienced there is no evidence of sleep paralysis, of cataplectic events hypnagogic or hypnopompic hallucinations.  Her periodic limb movements are related to pain.  Based on this I do not think we should have an MSLT it would also  be almost impossible to get a valid sleep test and daytime given the long list of medications with REM sleep suppressant potential.  I will return the care of the patient to her referring physician and primary care. ? ? ? ? ? ?She was seen here on 02-11-2022 in a consultation requested by her psychiatrist, Jenny Jenny Clay, MD  for an evaluation of excessive daytime  sleepiness. Marland Kitchen  ?Chief concern according to patient :  " I had a sleep study in 2019, HST, at [facility]. Gayla [name] , negative for apnea. My husband tells me I snore and sometimes stop breathing, gasping"  ?  ? Jenny [name]  has a past medical history of Anemia, anxiety about health, depression, Cancer ([place]), Diabetes mellitus, type 2 ([place]), and Enlarged thyroid. Obesity. Colon Cancer was diagnosed in 2015 and treated by Jenny Jenny [name]. Surgery 2016. Now cancer free.  ?  ?  ?Sleep relevant medical history: Nocturia 3-5 times (!) , Thyroid goiter , surgery is planned for this year, 2023. Tonsillectomy at age 29 , cervical spine surgery for DDD,  anterior fusion.  ? Family medical /sleep history: no  other family member on CPAP with OSA, insomnia, sleep walkers.  ?  ?Social history:  Patient is retired from IT sales professional school, [employer],  and lives in a household with husband. Family status is married with 3 adopted children, these are her biological grandchildren.  She  has a total of 5 grandchildren.  ?Pets are not present. ?Tobacco use; none .  ETOH use ;none ,  ?Caffeine intake in form of Coffee( 3-4 a week) Soda( 1 a week) Tea ( 2 a week) . ?Regular exercise in form of walking.   ? ?  ?  ?Sleep habits are as follows: The patient's dinner time is between 6 PM. The patient goes to bed at 9.30 PM and continues to struggle to sleep for hours, wakes for many bathroom breaks, the first time at 12 AM.   ?The preferred sleep position is supine due to body pain, with the support of 2-3 pillows. Dreams are reportedly rare/ infrequent.  ?6.30  AM is the usual rise time.  The patient wakes up with her husband. . She reports not feeling refreshed or restored in AM, with symptoms such as dry mouth, morning headaches, and residual fatigue.  ?Naps are taken frequently, by lunch time -lasting from 30 to 45 minutes and are more refreshing than nocturnal sleep.  ?  ?Review of Systems: ?Out of a complete 14 system review, the patient complains of only the following symptoms, and all other reviewed systems are negative.:  ?Fatigue, sleepiness , snoring, fragmented sleep, Insomnia, naps. Hypersomnia in daytime.  ? ?She reports chronic neuropathy from cancer treatment/ and that this pain keeps her from restful sleep. ?  ?How likely are you to doze in the following situations: ?0 = not likely, 1 = slight chance, 2 = moderate chance, 3 = high chance ?  ?Sitting and Reading? ?Watching Television? ?Sitting inactive in a public place (theater or meeting)? ?As a passenger in a car for an hour without a break? ?Lying down in the afternoon when circumstances permit? ?Sitting and talking to someone? ?Sitting quietly after lunch without alcohol? ?In a car, while stopped for a few minutes in traffic? ?  ?Total = 21/ 24 points  ? FSS endorsed at 43/ 63 points.  ? ?Social History  ? ?Socioeconomic History  ? Marital status: Married  ?  Spouse name: Jenny [name]  ? Number of children: 3  ? Years of education: Not on file  ? Highest education level: Bachelor's degree (e.g., BA, AB, BS)  ?Occupational History  ? Not on file  ?Tobacco Use  ? Smoking status: Never  ? Smokeless tobacco: Never  ?Vaping Use  ? Vaping Use: Never used  ?Substance and Sexual Activity  ? Alcohol use: No  ? Drug use: No  ? Sexual activity: Yes  ?  Birth control/protection: Surgical  ?Other Topics Concern  ? Not on file  ?Social History Narrative  ? Lives with husband and 3 children  ? Right handed  ? Caffeine: 4x a week  ? ?Social Determinants of Health  ? ?Financial Resource Strain: Not on file  ?Food Insecurity: Not on file  ?Transportation  Needs: Not on file  ?Physical Activity: Not on file  ?Stress: Not on file  ?Social Connections: Not on file  ? ? ?Family History  ?Problem Relation Age of Onset  ? Cancer Brother   ? Depression Brother   ? Cancer Brother   ? Hypertension Mother   ? Colon cancer Neg Hx   ? ? ?Past Medical History:  ?Diagnosis Date  ? Anemia   ? Cancer ([place]) 2015 and surgery 2016 .  ? colon cancer - colectomy.  ? Diabetes mellitus, type 2 ([place])   ? Enlarged thyroid ? ?Class 3 Obesity ? ?Chronic pain  ? ?Chronic fatigue.  ? ?  Nocturia    ? ? ?Past Surgical History:  ?Procedure Laterality Date  ? BOWEL RESECTION    ? CERVICAL SPINE SURGERY  06/17/2012  ? C5-C7 ACDF  ? CESAREAN SECTION    ? PARTIAL HYSTERECTOMY    ? PORT-A-CATH REMOVAL Left 07/28/2015  ? Procedure: REMOVAL PORT-A-CATH;  Surgeon: [name], MD;  Location: [facility];  Service: General;  Laterality: Left;  ? PORTACATH PLACEMENT Left 12/22/2014  ? Procedure: INSERTION PORT-A-CATH LEFT SUBCLAVIAN;  Surgeon: [name], MD;  Location: [facility];  Service: General;  Laterality: Left;  ? TONSILLECTOMY    ?  ? ?Current Outpatient Medications on File Prior to Visit  ?Medication Sig Dispense Refill  ? ARIPiprazole (ABILIFY) 10 MG tablet Take 1 tablet (10 mg total) by mouth daily. 90 tablet 1  ? atorvastatin (LIPITOR) 20 MG tablet Take 1 tablet (20 mg total) by mouth daily. 90 tablet 3  ? benzonatate (TESSALON) 100 MG capsule Take 1 capsule (100 mg total) by mouth 2 (two) times daily as needed for cough. 20 capsule 0  ? buPROPion (WELLBUTRIN XL) 150 MG 24 hr tablet Take 1 tablet (150 mg total) by mouth daily. Take total of 450 mg daily (300 mg + 150 mg) 90 tablet 1  ? buPROPion (WELLBUTRIN XL) 300 MG 24 hr tablet Take 1 tablet (300 mg total) by mouth daily. Take total of 450 mg daily, along with 150 mg daily 90 tablet 1  ? cyclobenzaprine (FLEXERIL) 5 MG tablet TAKE 1 TABLET BY MOUTH 2 TIMES DAILY AS NEEDED FOR MUSCLE SPASMS. 30 tablet 0  ? diclofenac Sodium (VOLTAREN) 1 % GEL Apply 2  g topically 4 (four) times daily. 150 g 4  ? empagliflozin (JARDIANCE) 10 MG TABS tablet TAKE 1 TABLET BY MOUTH EVERY DAY 90 tablet 2  ? fluticasone (FLONASE) 50 MCG/ACT nasal spray Place 2 sprays into

## 2022-04-17 NOTE — Patient Instructions (Signed)
Hypersomnia Hypersomnia is a condition in which a person feels very tired during the day even though the person gets plenty of sleep at night. A person with this condition may take naps during the day and may find it very difficult to wake up from sleep. Hypersomnia may affect a person's ability to think, concentrate, drive, or remember things. What are the causes? The cause of this condition may not be known. Possible causes include: Taking certain medicines. Using drugs or alcohol. Sleep disorders, such as narcolepsy and sleep apnea. Injury to the head, brain, or spinal cord. Tumors. Certain medical conditions. These include: Depression. Diabetes. Gastroesophageal reflux disease (GERD). An underactive thyroid gland (hypothyroidism). What are the signs or symptoms? The main symptoms of hypersomnia include: Feeling very tired throughout the day, regardless of how much sleep you got the night before. Having trouble waking up. Others may find it difficult to wake you up when you are sleeping. Sleeping for longer and longer periods at a time. Taking naps throughout the day. Other symptoms may include: Feeling restless, anxious, or annoyed. Lacking energy. Having trouble with: Remembering. Speaking. Thinking. Loss of appetite. Seeing, hearing, tasting, smelling, or feeling things that are not real (hallucinations). How is this diagnosed? This condition may be diagnosed based on: Your symptoms and medical history. Your sleeping habits. Your health care provider may ask you to write down your sleeping habits in a daily sleep log, along with any symptoms you have. A series of tests that are done while you sleep (sleep study or polysomnogram). A test that measures how quickly you can fall asleep during the day (daytime nap study or multiple sleep latency test). How is this treated? This condition may be treated by: Following a regular sleep routine. Making lifestyle changes, such as  changing your eating habits, getting regular exercise, and avoiding alcohol or caffeinated beverages. Taking medicines to make you more alert (stimulants) during the day. Treating any underlying medical causes of hypersomnia. Follow these instructions at home: Sleep habits Stick to a routine that includes going to bed and waking up at the same times every day and night. Practice a relaxing bedtime routine. This may include reading, meditation, deep breathing, or taking a warm bath before going to sleep. Exercise regularly as told by your health care provider. However, avoid exercising in the hours right before bedtime. Keep your sleep environment at a cooler temperature, darkened, and quiet. Sleep with pillows and a mattress that are comfortable and supportive. Schedule short 20-minute naps for when you feel sleepiest during the day. Talk with your employer or teachers about your hypersomnia. If possible, adjust your schedule so that: You have a regular daytime work schedule. You can take a scheduled nap during the day. You do not have to work or be active at night. Do not eat a heavy meal for a few hours before bedtime. Eat your meals at about the same times every day. Safety  Do not drive or use machinery if you are sleepy. Ask your health care provider if it is safe for you to drive. Wear a life jacket when swimming or spending time near water. General instructions  Take over-the-counter and prescription medicines only as told by your health care provider. This includes supplements. Avoid drinking alcohol or caffeinated beverages. Keep a sleep log that will help your health care provider manage your condition. This may include information about: What time you go to bed each night. How often you wake up at night. How many hours   you sleep at night. How often and for how long you nap during the day. Any observations from others, such as leg movements during sleep, sleep walking, or  snoring. Keep all follow-up visits. This is important. Contact a health care provider if: You have new symptoms. Your symptoms get worse. Get help right away if: You have thoughts about hurting yourself or someone else. Get help right away if you feel like you may hurt yourself or others, or have thoughts about taking your own life. Go to your nearest emergency room or: Call 911. Call the National Suicide Prevention Lifeline at 1-800-273-8255 or 988. This is open 24 hours a day. Text the Crisis Text Line at 741741. Summary Hypersomnia refers to a condition in which you feel very tired during the day even though you get plenty of sleep at night. A person with this condition may take naps during the day and may find it very difficult to wake up from sleep. Hypersomnia may affect a person's ability to think, concentrate, drive, or remember things. Treatment may include a regular sleep routine and making some lifestyle changes. This information is not intended to replace advice given to you by your health care provider. Make sure you discuss any questions you have with your health care provider. Document Revised: 11/19/2021 Document Reviewed: 11/19/2021 Elsevier Patient Education  2023 Elsevier Inc.  
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
## 2022-04-22 ENCOUNTER — Telehealth (INDEPENDENT_AMBULATORY_CARE_PROVIDER_SITE_OTHER): Payer: Medicare PPO | Admitting: Psychiatry

## 2022-04-22 ENCOUNTER — Encounter: Payer: Self-pay | Admitting: Psychiatry

## 2022-04-22 DIAGNOSIS — G4700 Insomnia, unspecified: Secondary | ICD-10-CM

## 2022-04-22 DIAGNOSIS — F4310 Post-traumatic stress disorder, unspecified: Secondary | ICD-10-CM

## 2022-04-22 DIAGNOSIS — F330 Major depressive disorder, recurrent, mild: Secondary | ICD-10-CM | POA: Diagnosis not present

## 2022-04-22 MED ORDER — BUPROPION HCL ER (XL) 150 MG PO TB24: 150.0000 mg | ORAL_TABLET | Freq: Every day | ORAL | 1 refills | Status: DC

## 2022-04-22 MED ORDER — BUPROPION HCL ER (XL) 300 MG PO TB24: 300.0000 mg | ORAL_TABLET | Freq: Every day | ORAL | 1 refills | Status: DC

## 2022-04-22 MED ORDER — TRAZODONE HCL 50 MG PO TABS: 50.0000 mg | ORAL_TABLET | Freq: Every evening | ORAL | 1 refills | Status: DC | PRN

## 2022-04-22 NOTE — Patient Instructions (Addendum)
Continue venlafaxine 187.5  mg daily  ?Continue bupropion 450 mg (300 mg + 150 mg) daily ?Continue Abilify 10 mg daily ?Hold prazosin ?Continue Trazodone 50 mg at night as needed for sleep ?Next appointment: 6/26 at 4:30,video ?

## 2022-05-01 ENCOUNTER — Ambulatory Visit (INDEPENDENT_AMBULATORY_CARE_PROVIDER_SITE_OTHER): Payer: Medicare PPO | Admitting: Psychiatry

## 2022-05-01 ENCOUNTER — Ambulatory Visit (INDEPENDENT_AMBULATORY_CARE_PROVIDER_SITE_OTHER): Payer: Medicare PPO | Admitting: Family Medicine

## 2022-05-01 DIAGNOSIS — F330 Major depressive disorder, recurrent, mild: Secondary | ICD-10-CM

## 2022-05-01 DIAGNOSIS — E119 Type 2 diabetes mellitus without complications: Secondary | ICD-10-CM

## 2022-05-01 DIAGNOSIS — F4310 Post-traumatic stress disorder, unspecified: Secondary | ICD-10-CM

## 2022-05-01 MED ORDER — OZEMPIC (0.25 OR 0.5 MG/DOSE) 2 MG/1.5ML ~~LOC~~ SOPN: 0.2500 mg | PEN_INJECTOR | SUBCUTANEOUS | 8 refills | Status: DC
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
## 2022-05-01 NOTE — Progress Notes (Signed)
? ? ?  SUBJECTIVE:  ? ?CHIEF COMPLAINT / HPI:  ? ?  ?Type 2 diabetes mellitus without complications ([place]) ?She is ready to start a GLP1.  Taking metformin most days and jaridance although does seem to give her more yeast infections.  No history of pancreatits ? ?Planning to have thyroid surgery soon  ?OBJECTIVE:  ? ?BP 114/86   Pulse 67   Wt 241 lb 9.6 oz (109.6 kg)   SpO2 98%   BMI 41.47 kg/m?   ?Psych:  Cognition and judgment appear intact. Alert, communicative  and cooperative with normal attention span and concentration. No apparent delusions, illusions, hallucinations ? ? ? ?ASSESSMENT/PLAN:  ? ?Type 2 diabetes mellitus without complications ([place]) ?Not at goal.  Start Ozempic and monitor tolerance and her A1c.  See back in 2 months  ?  ?Patient Instructions  ?Good to see you today - Thank you for coming in ? ?Things we discussed today: ? ?Diabetes ?Start the [place] as directed ?If you have side effects - abdomen pain then let me know ?  ?Text? BEGIN to [phone] to get started. ? ?[email or web address] ? ? ?You need an diabetes eye exam every year.  Please see your eye doctor.  Ask them to fax [name] a report of your exam  ? ? ?Please always bring your medication bottles ? ?Come back to see me in two months or sooner if any problem ? ? ?Lind Covert, MD ?[place]  ?

## 2022-05-01 NOTE — Patient Instructions (Addendum)
Good to see you today - Thank you for coming in ? ?Things we discussed today: ? ?Diabetes ?Start the [place] as directed ?If you have side effects - abdomen pain then let me know ?  ?Text? BEGIN to [phone] to get started. ? ?[email or web address] ? ? ?You need an diabetes eye exam every year.  Please see your eye doctor.  Ask them to fax [name] a report of your exam  ? ? ?Please always bring your medication bottles ? ?Come back to see me in two months or sooner if any problem ?

## 2022-05-01 NOTE — Assessment & Plan Note (Signed)
Not at goal.  Start Ozempic and monitor tolerance and her A1c.  See back in 2 months  ?

## 2022-05-16 ENCOUNTER — Ambulatory Visit (INDEPENDENT_AMBULATORY_CARE_PROVIDER_SITE_OTHER): Payer: Medicare PPO | Admitting: Psychiatry

## 2022-05-16 DIAGNOSIS — F4310 Post-traumatic stress disorder, unspecified: Secondary | ICD-10-CM

## 2022-05-16 DIAGNOSIS — F330 Major depressive disorder, recurrent, mild: Secondary | ICD-10-CM | POA: Diagnosis not present
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
## 2022-05-28 ENCOUNTER — Ambulatory Visit (INDEPENDENT_AMBULATORY_CARE_PROVIDER_SITE_OTHER): Payer: Medicare PPO | Admitting: Psychiatry

## 2022-05-28 DIAGNOSIS — F4310 Post-traumatic stress disorder, unspecified: Secondary | ICD-10-CM | POA: Diagnosis not present

## 2022-05-28 DIAGNOSIS — F330 Major depressive disorder, recurrent, mild: Secondary | ICD-10-CM
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
## 2022-06-04 ENCOUNTER — Ambulatory Visit (INDEPENDENT_AMBULATORY_CARE_PROVIDER_SITE_OTHER): Payer: Medicare PPO | Admitting: Psychiatry

## 2022-06-04 DIAGNOSIS — F330 Major depressive disorder, recurrent, mild: Secondary | ICD-10-CM

## 2022-06-04 DIAGNOSIS — F4310 Post-traumatic stress disorder, unspecified: Secondary | ICD-10-CM | POA: Diagnosis not present
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
## 2022-06-17 ENCOUNTER — Telehealth (INDEPENDENT_AMBULATORY_CARE_PROVIDER_SITE_OTHER): Payer: Medicare PPO | Admitting: Psychiatry

## 2022-06-17 ENCOUNTER — Encounter: Payer: Self-pay | Admitting: Psychiatry

## 2022-06-17 DIAGNOSIS — F330 Major depressive disorder, recurrent, mild: Secondary | ICD-10-CM | POA: Diagnosis not present

## 2022-06-17 DIAGNOSIS — F4310 Post-traumatic stress disorder, unspecified: Secondary | ICD-10-CM | POA: Diagnosis not present

## 2022-06-17 DIAGNOSIS — G4700 Insomnia, unspecified: Secondary | ICD-10-CM | POA: Diagnosis not present

## 2022-06-17 MED ORDER — VENLAFAXINE HCL ER 150 MG PO CP24: 150.0000 mg | ORAL_CAPSULE | Freq: Every day | ORAL | 1 refills | Status: DC

## 2022-06-18 ENCOUNTER — Ambulatory Visit (INDEPENDENT_AMBULATORY_CARE_PROVIDER_SITE_OTHER): Payer: Medicare PPO | Admitting: Psychiatry

## 2022-06-18 DIAGNOSIS — F330 Major depressive disorder, recurrent, mild: Secondary | ICD-10-CM | POA: Diagnosis not present

## 2022-06-18 DIAGNOSIS — F4310 Post-traumatic stress disorder, unspecified: Secondary | ICD-10-CM | POA: Diagnosis not present
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
## 2022-06-19 ENCOUNTER — Other Ambulatory Visit: Payer: Self-pay

## 2022-06-19 MED ORDER — OZEMPIC (0.25 OR 0.5 MG/DOSE) 2 MG/1.5ML ~~LOC~~ SOPN: 0.2500 mg | PEN_INJECTOR | SUBCUTANEOUS | 8 refills | Status: DC

## 2022-06-26 ENCOUNTER — Encounter: Payer: Self-pay | Admitting: Family Medicine

## 2022-06-26 ENCOUNTER — Ambulatory Visit (INDEPENDENT_AMBULATORY_CARE_PROVIDER_SITE_OTHER): Payer: Medicare PPO | Admitting: Family Medicine

## 2022-06-26 VITALS — BP 122/81 | HR 74 | Wt 237.8 lb

## 2022-06-26 DIAGNOSIS — G893 Neoplasm related pain (acute) (chronic): Secondary | ICD-10-CM | POA: Diagnosis not present

## 2022-06-26 DIAGNOSIS — E119 Type 2 diabetes mellitus without complications: Secondary | ICD-10-CM

## 2022-06-26 LAB — POCT GLYCOSYLATED HEMOGLOBIN (HGB A1C): Hemoglobin A1C: 7.1 % — AB (ref 4.0–5.6)

## 2022-06-26 NOTE — Patient Instructions (Signed)
Good to see you today - Thank you for coming in  Things we discussed today:  Ozempic This Saturday take 0.25 Then the next Saturday take 0.5 from then on every week  Let me know if any side effects  Pain Use the diclofenac ointment first Then use ibuprofen or flexaril as needed Do not take Meloxicam or Robaxin    You need an diabetes eye exam every year.  Please see your eye doctor.  Ask them to fax [name] a report of your exam   Please always bring your medication bottles  Come back to see me in 3 months

## 2022-06-26 NOTE — Assessment & Plan Note (Signed)
Seems to be a flare of her chronic pain.  No signs of acute arthritis or infection.   Reviewed all her medications and clarified her medication list.  See after visit summary for plan.

## 2022-06-26 NOTE — Progress Notes (Signed)
    SUBJECTIVE:   CHIEF COMPLAINT / HPI:   Type 2 diabetes mellitus without complications ([place]) Started Ozempic last visit has taken times 4 but missed last week.  Taking metformin most days and jaridance although does seem to give her more yeast infections.  No history of pancreatits.  No low blood sugars  Pain Having diffuse pain.  Unsure what medications to take other than gabapentin.  No joint swelling or fevers    PERTINENT  PMH / PSH: History of colon cancer  OBJECTIVE:   BP 122/81   Pulse 74   Wt 237 lb 12.8 oz (107.9 kg)   SpO2 99%   BMI 40.82 kg/m   Good spirits Moves slowly and stiffly around room   ASSESSMENT/PLAN:   Chronic pain after treatment for malignant neoplasm Seems to be a flare of her chronic pain.  No signs of acute arthritis or infection.   Reviewed all her medications and clarified her medication list.  See after visit summary for plan.  Type 2 diabetes mellitus without complications ([facility]) Improving control.  Will continue to slowly increase her Ozempic to 0.5 weekly hopefully may help with weight loss.  Need to monitor how affects her pain and her mood    Patient Instructions  Good to see you today - Thank you for coming in  Things we discussed today:  Ozempic This Saturday take 0.25 Then the next Saturday take 0.5 from then on every week  Let me know if any side effects  Pain Use the diclofenac ointment first Then use ibuprofen or flexaril as needed Do not take Meloxicam or Robaxin    You need an diabetes eye exam every year.  Please see your eye doctor.  Ask them to fax [name] a report of your exam   Please always bring your medication bottles  Come back to see me in 3 months    Lind Covert, [place]

## 2022-06-26 NOTE — Assessment & Plan Note (Signed)
Improving control.  Will continue to slowly increase her Ozempic to 0.5 weekly hopefully may help with weight loss.  Need to monitor how affects her pain and her mood

## 2022-07-02 ENCOUNTER — Ambulatory Visit (HOSPITAL_COMMUNITY): Payer: Medicare PPO | Admitting: Psychiatry

## 2022-07-11 DIAGNOSIS — Z1231 Encounter for screening mammogram for malignant neoplasm of breast: Secondary | ICD-10-CM | POA: Diagnosis not present

## 2022-07-16 ENCOUNTER — Ambulatory Visit (INDEPENDENT_AMBULATORY_CARE_PROVIDER_SITE_OTHER): Payer: Medicare PPO | Admitting: Psychiatry

## 2022-07-16 DIAGNOSIS — F4310 Post-traumatic stress disorder, unspecified: Secondary | ICD-10-CM
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
## 2022-07-19 ENCOUNTER — Encounter: Payer: Self-pay | Admitting: Family Medicine

## 2022-07-25 DIAGNOSIS — R928 Other abnormal and inconclusive findings on diagnostic imaging of breast: Secondary | ICD-10-CM | POA: Diagnosis not present

## 2022-08-02 ENCOUNTER — Ambulatory Visit (INDEPENDENT_AMBULATORY_CARE_PROVIDER_SITE_OTHER): Payer: Medicare PPO | Admitting: Psychiatry

## 2022-08-02 DIAGNOSIS — F330 Major depressive disorder, recurrent, mild: Secondary | ICD-10-CM

## 2022-08-02 DIAGNOSIS — F4310 Post-traumatic stress disorder, unspecified: Secondary | ICD-10-CM | POA: Diagnosis not present
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
## 2022-08-05 DIAGNOSIS — H2513 Age-related nuclear cataract, bilateral: Secondary | ICD-10-CM | POA: Diagnosis not present

## 2022-08-05 DIAGNOSIS — H52203 Unspecified astigmatism, bilateral: Secondary | ICD-10-CM | POA: Diagnosis not present

## 2022-08-05 DIAGNOSIS — Z794 Long term (current) use of insulin: Secondary | ICD-10-CM | POA: Diagnosis not present

## 2022-08-05 DIAGNOSIS — E119 Type 2 diabetes mellitus without complications: Secondary | ICD-10-CM | POA: Diagnosis not present

## 2022-08-05 DIAGNOSIS — H524 Presbyopia: Secondary | ICD-10-CM | POA: Diagnosis not present

## 2022-08-05 DIAGNOSIS — H04123 Dry eye syndrome of bilateral lacrimal glands: Secondary | ICD-10-CM | POA: Diagnosis not present

## 2022-08-05 DIAGNOSIS — H5213 Myopia, bilateral: Secondary | ICD-10-CM | POA: Diagnosis not present

## 2022-08-05 DIAGNOSIS — H31002 Unspecified chorioretinal scars, left eye: Secondary | ICD-10-CM | POA: Diagnosis not present
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
## 2022-08-07 ENCOUNTER — Encounter: Payer: Self-pay | Admitting: Psychiatry

## 2022-08-07 ENCOUNTER — Telehealth (INDEPENDENT_AMBULATORY_CARE_PROVIDER_SITE_OTHER): Payer: Medicare PPO | Admitting: Psychiatry

## 2022-08-07 DIAGNOSIS — G4700 Insomnia, unspecified: Secondary | ICD-10-CM | POA: Diagnosis not present

## 2022-08-07 DIAGNOSIS — F4310 Post-traumatic stress disorder, unspecified: Secondary | ICD-10-CM

## 2022-08-07 DIAGNOSIS — F330 Major depressive disorder, recurrent, mild: Secondary | ICD-10-CM

## 2022-08-07 MED ORDER — ARIPIPRAZOLE 10 MG PO TABS: 10.0000 mg | ORAL_TABLET | Freq: Every day | ORAL | 0 refills | Status: DC

## 2022-08-07 MED ORDER — VENLAFAXINE HCL ER 37.5 MG PO CP24
37.5000 mg | ORAL_CAPSULE | Freq: Every day | ORAL | 1 refills | Status: DC
Start: 2022-08-18 — End: 2023-01-29

## 2022-08-07 NOTE — Patient Instructions (Signed)
Continue venlafaxine 187.5  mg daily  Continue bupropion 450 mg (300 mg + 150 mg) daily Continue Abilify 10 mg daily  Continue Trazodone 50 mg at night as needed for sleep Next appointment: 10/11 at 2 30

## 2022-08-12 ENCOUNTER — Encounter: Payer: Self-pay | Admitting: Family Medicine

## 2022-08-16 ENCOUNTER — Ambulatory Visit (INDEPENDENT_AMBULATORY_CARE_PROVIDER_SITE_OTHER): Payer: Medicare PPO | Admitting: Psychiatry

## 2022-08-16 ENCOUNTER — Telehealth (HOSPITAL_COMMUNITY): Payer: Self-pay | Admitting: *Deleted

## 2022-08-16 DIAGNOSIS — F330 Major depressive disorder, recurrent, mild: Secondary | ICD-10-CM

## 2022-08-16 DIAGNOSIS — F4310 Post-traumatic stress disorder, unspecified: Secondary | ICD-10-CM | POA: Diagnosis not present

## 2022-08-16 NOTE — Telephone Encounter (Signed)
Patient called and [facility] stating she would like to sch an appt. Staff called patient back and was not able to reach her and [place] for her to call office back. Office number was left on voicemail
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
## 2022-08-30 ENCOUNTER — Ambulatory Visit (HOSPITAL_COMMUNITY): Payer: Medicare PPO | Admitting: Psychiatry

## 2022-09-18 ENCOUNTER — Ambulatory Visit (INDEPENDENT_AMBULATORY_CARE_PROVIDER_SITE_OTHER): Payer: Medicare PPO | Admitting: Psychiatry

## 2022-09-18 DIAGNOSIS — F4310 Post-traumatic stress disorder, unspecified: Secondary | ICD-10-CM

## 2022-09-18 DIAGNOSIS — F330 Major depressive disorder, recurrent, mild: Secondary | ICD-10-CM | POA: Diagnosis not present
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
## 2022-09-20 ENCOUNTER — Other Ambulatory Visit: Payer: Self-pay | Admitting: Family Medicine
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
## 2022-10-02 ENCOUNTER — Ambulatory Visit (INDEPENDENT_AMBULATORY_CARE_PROVIDER_SITE_OTHER): Payer: Medicare PPO | Admitting: Psychiatry

## 2022-10-02 ENCOUNTER — Encounter: Payer: Self-pay | Admitting: Psychiatry

## 2022-10-02 ENCOUNTER — Telehealth (INDEPENDENT_AMBULATORY_CARE_PROVIDER_SITE_OTHER): Payer: Medicare PPO | Admitting: Psychiatry

## 2022-10-02 ENCOUNTER — Encounter (HOSPITAL_COMMUNITY): Payer: Self-pay

## 2022-10-02 DIAGNOSIS — F330 Major depressive disorder, recurrent, mild: Secondary | ICD-10-CM | POA: Diagnosis not present

## 2022-10-02 DIAGNOSIS — F4310 Post-traumatic stress disorder, unspecified: Secondary | ICD-10-CM

## 2022-10-02 DIAGNOSIS — G4700 Insomnia, unspecified: Secondary | ICD-10-CM

## 2022-10-02 MED ORDER — BUPROPION HCL ER (XL) 300 MG PO TB24: 300.0000 mg | ORAL_TABLET | Freq: Every day | ORAL | 1 refills | Status: DC

## 2022-10-02 MED ORDER — BUPROPION HCL ER (XL) 150 MG PO TB24: 150.0000 mg | ORAL_TABLET | Freq: Every day | ORAL | 1 refills | Status: DC

## 2022-10-02 MED ORDER — TRAZODONE HCL 50 MG PO TABS: 50.0000 mg | ORAL_TABLET | Freq: Every evening | ORAL | 1 refills | Status: DC | PRN
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
## 2022-10-02 NOTE — Plan of Care (Signed)
  Problem: PTSD-Trauma Disorder CCP avoidant behaviors, thoughts of self-blame  Goal:  " I want to look at me differently and stop blaming myself for the things that happened to me" Outcome: Progressing Goal: LTG: Elimination of maladaptive behaviors and thinking patterns which interfere with resolution of trauma: AEB by pt reducing negative thoughts about self and  thoughts of self-blame for trauma history to 2 x or less per week for 4 consecutive weeks.  Outcome: Progressing Goal: STG: Jenny [name] WILL PRACTICE EMOTION REGULATION SKILLS 5 PER WEEK FOR THE NEXT 12 WEEKS Outcome: Progressing

## 2022-10-16 ENCOUNTER — Telehealth (HOSPITAL_COMMUNITY): Payer: Self-pay | Admitting: Psychiatry

## 2022-10-16 ENCOUNTER — Ambulatory Visit (INDEPENDENT_AMBULATORY_CARE_PROVIDER_SITE_OTHER): Payer: Medicare PPO | Admitting: Psychiatry

## 2022-10-16 DIAGNOSIS — F4310 Post-traumatic stress disorder, unspecified: Secondary | ICD-10-CM

## 2022-10-16 DIAGNOSIS — F330 Major depressive disorder, recurrent, mild: Secondary | ICD-10-CM
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
## 2022-10-16 NOTE — Telephone Encounter (Signed)
Opened in error

## 2022-10-24 ENCOUNTER — Ambulatory Visit (INDEPENDENT_AMBULATORY_CARE_PROVIDER_SITE_OTHER): Payer: Medicare PPO | Admitting: Family Medicine

## 2022-10-24 VITALS — BP 125/87 | HR 73 | Temp 98.2°F | Ht 64.0 in | Wt 239.0 lb

## 2022-10-24 DIAGNOSIS — J069 Acute upper respiratory infection, unspecified: Secondary | ICD-10-CM | POA: Diagnosis not present

## 2022-10-24 MED ORDER — ONDANSETRON 4 MG PO TBDP: 4.0000 mg | ORAL_TABLET | Freq: Three times a day (TID) | ORAL | 0 refills | Status: DC | PRN

## 2022-10-24 MED ORDER — BENZONATATE 100 MG PO CAPS: 100.0000 mg | ORAL_CAPSULE | Freq: Two times a day (BID) | ORAL | 0 refills | Status: DC | PRN

## 2022-10-24 NOTE — Progress Notes (Signed)
    SUBJECTIVE:   CHIEF COMPLAINT / HPI:   Sick symptoms Started last Thursday as a cold.  She reports symptoms of body aches, sore throat, chills, mild loose stools, productive cough, nausea, and bothersome ears. Little bit of chest tightness, SOB, rattling while breathing.   Denies fever, vomiting, constipation, and diarrhea.  Meds tried: night time cold medicine, alkaselzer cold medicine, cough drops, mucinex, dayquil, tylenol   Numerous sick contacts including husband and grandkids.  Also at a function a week or two ago with a couple of sick people.  No personal history of asthma COPD.  Does have well-controlled diabetes, last A1c 7.1 in July.  She does have a history of colon cancer but is now s/p treatment; she is now considered immunocompetent.  PERTINENT  PMH / PSH: T2DM, obesity, chemo-induced peripheral neuropathy, PTSD, MDD, HLD, excessive daytime sleepiness   OBJECTIVE:   BP 125/87   Pulse 73   Temp 98.2 F (36.8 C) (Oral)   Ht '5\' 4"'$  (1.626 m)   Wt 239 lb (108.4 kg)   SpO2 99%   BMI 41.02 kg/m    General: Awake, alert, mild distress, appears fatigued HEENT: TMs pearly pink and unremarkable bilaterally, sclera anicteric and without injection, EOM intact, nares unremarkable, oral mucosa moist, mild tonsillar enlargement Lymph: Reactive anterior lymphadenopathy of neck, mildly tender to palpation Respiratory: Normal work of breathing, CTAB, no wheezing auscultated Cardiac: Regular rate and rhythm, no murmurs appreciated, brisk cap refill, normal skin turgor  ASSESSMENT/PLAN:   Viral URI with cough 1 week duration of symptoms.  Likely viral URI.  Discussed with patient limited utility of flu or COVID swabs at this time given she is too far out to initiate medications for these; she declined testing for her own knowledge.  No evidence of bacterial process such as pneumonia, otitis media, or conjunctivitis; no antibiotics indicated at this time.  Vital signs stable with  normal SPO2.  At this time, recommend supportive care and aggressive oral hydration.  Will Rx Tessalon Perles and Zofran.  Return precautions given, see AVS for more.    Ezequiel Essex, MD [place]

## 2022-10-24 NOTE — Patient Instructions (Signed)
It was wonderful to see you today. Thank you for allowing me to be a part of your care. Below is a short summary of what we discussed at your visit today:  Upper respiratory infection Tessalon Perles for cough - keep away from children Honey for cough Zofran up to every 8 hours for nausea that prevents fluid intake Really push the fluids: Can be soups, popsicles, Pedialyte, Gatorade especially if water tastes bad right now Humidifier in the room where you sleep  No evidence of pneumonia or other bacterial infection right now that needs antibiotics. You are too far out from the start of symptoms to have any benefit from [place] or flu medications. You elected today to not get a nasal swab for flu or COVID.  Reasons to come back for evaluation of pneumonia Worsening cough Cough associated with chest tightness or shortness of breath Fever    If you have any questions or concerns, please do not hesitate to contact us via phone or MyChart message.   Ezequiel Essex, MD

## 2022-10-25 DIAGNOSIS — J069 Acute upper respiratory infection, unspecified: Secondary | ICD-10-CM | POA: Insufficient documentation

## 2022-10-25 NOTE — Assessment & Plan Note (Signed)
1 week duration of symptoms.  Likely viral URI.  Discussed with patient limited utility of flu or COVID swabs at this time given she is too far out to initiate medications for these; she declined testing for her own knowledge.  No evidence of bacterial process such as pneumonia, otitis media, or conjunctivitis; no antibiotics indicated at this time.  Vital signs stable with normal SPO2.  At this time, recommend supportive care and aggressive oral hydration.  Will Rx Tessalon Perles and Zofran.  Return precautions given, see AVS for more.

## 2022-10-29 ENCOUNTER — Telehealth: Payer: Self-pay

## 2022-10-29 NOTE — Telephone Encounter (Signed)
Patients husband calls nurse line reporting continued cough in patient.   He reports the cough is constant and keeping her up at night. I asked if she was able to pick up tessalon capsules. He reports they were not aware anything was called in.   Advised to pick this up at [facility].  Patient advised to schedule an apt if symptoms do not with medication.

## 2022-10-30 ENCOUNTER — Ambulatory Visit (HOSPITAL_COMMUNITY): Payer: Medicare PPO | Admitting: Psychiatry

## 2022-10-30 ENCOUNTER — Encounter (HOSPITAL_COMMUNITY): Payer: Self-pay

## 2022-11-13 ENCOUNTER — Ambulatory Visit (INDEPENDENT_AMBULATORY_CARE_PROVIDER_SITE_OTHER): Payer: Medicare PPO | Admitting: Psychiatry

## 2022-11-13 DIAGNOSIS — F4310 Post-traumatic stress disorder, unspecified: Secondary | ICD-10-CM | POA: Diagnosis not present

## 2022-11-13 DIAGNOSIS — F330 Major depressive disorder, recurrent, mild: Secondary | ICD-10-CM
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
## 2022-11-28 ENCOUNTER — Ambulatory Visit (INDEPENDENT_AMBULATORY_CARE_PROVIDER_SITE_OTHER): Payer: Medicare PPO | Admitting: Psychiatry

## 2022-11-28 DIAGNOSIS — F4310 Post-traumatic stress disorder, unspecified: Secondary | ICD-10-CM | POA: Diagnosis not present

## 2022-11-28 DIAGNOSIS — F330 Major depressive disorder, recurrent, mild: Secondary | ICD-10-CM
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
## 2022-12-03 ENCOUNTER — Encounter: Payer: Self-pay | Admitting: Psychiatry

## 2022-12-03 ENCOUNTER — Ambulatory Visit (INDEPENDENT_AMBULATORY_CARE_PROVIDER_SITE_OTHER): Payer: Medicare PPO | Admitting: Psychiatry

## 2022-12-03 VITALS — BP 132/84 | HR 72 | Temp 97.9°F | Ht 64.0 in | Wt 241.8 lb

## 2022-12-03 DIAGNOSIS — F331 Major depressive disorder, recurrent, moderate: Secondary | ICD-10-CM

## 2022-12-03 DIAGNOSIS — G4700 Insomnia, unspecified: Secondary | ICD-10-CM

## 2022-12-03 DIAGNOSIS — F4310 Post-traumatic stress disorder, unspecified: Secondary | ICD-10-CM

## 2022-12-03 MED ORDER — ARIPIPRAZOLE 10 MG PO TABS: 10.0000 mg | ORAL_TABLET | Freq: Every day | ORAL | 0 refills | Status: DC

## 2022-12-03 NOTE — Patient Instructions (Signed)
Continue venlafaxine 187.5  mg daily  Continue bupropion 450 mg (300 mg + 150 mg) daily Continue Abilify 10 mg daily  Increase gabapentin 300 mg three times a day  Continue Trazodone 50 mg at night as needed for sleep Next appointment: 2/7 at 1:30,

## 2022-12-19 ENCOUNTER — Ambulatory Visit: Payer: Medicare PPO | Admitting: Family Medicine

## 2022-12-19 ENCOUNTER — Encounter: Payer: Self-pay | Admitting: Family Medicine

## 2022-12-19 VITALS — BP 118/79 | HR 73 | Ht 64.0 in | Wt 240.6 lb

## 2022-12-19 DIAGNOSIS — E119 Type 2 diabetes mellitus without complications: Secondary | ICD-10-CM | POA: Diagnosis not present

## 2022-12-19 DIAGNOSIS — Z23 Encounter for immunization: Secondary | ICD-10-CM

## 2022-12-19 DIAGNOSIS — G893 Neoplasm related pain (acute) (chronic): Secondary | ICD-10-CM | POA: Diagnosis not present

## 2022-12-19 LAB — POCT GLYCOSYLATED HEMOGLOBIN (HGB A1C): HbA1c, POC (controlled diabetic range): 7.1 % — AB (ref 0.0–7.0)

## 2022-12-19 MED ORDER — SEMAGLUTIDE (1 MG/DOSE) 4 MG/3ML ~~LOC~~ SOPN: 1.0000 mg | PEN_INJECTOR | SUBCUTANEOUS | 4 refills | Status: DC

## 2022-12-19 MED ORDER — GABAPENTIN 300 MG PO CAPS: 300.0000 mg | ORAL_CAPSULE | Freq: Three times a day (TID) | ORAL | 2 refills | Status: DC

## 2022-12-19 MED ORDER — ZOSTER VAC RECOMB ADJUVANTED 50 MCG/0.5ML IM SUSR: INTRAMUSCULAR | 1 refills | Status: DC

## 2022-12-19 NOTE — Progress Notes (Addendum)
    SUBJECTIVE:   CHIEF COMPLAINT / HPI:   Chronic pain after treatment for malignant neoplasm Having pain essentially all over - shoulders, back, legs.  Did not bring in medications and is unsure exactly what she is taking for pain.  No joint swelling or new weakness.   Type 2 diabetes mellitus without complications ([place]) Taking Ozempic to 0.5 weekly Not exercising very regularly  Mood Seeing her psychiatrist regularly. Marks 1 on question 9 of PHQ9.  She feels this is baseline and has not active suicidal ideation just ambivalence about life   OBJECTIVE:   BP 118/79   Pulse 73   Ht '5\' 4"'$  (1.626 m)   Wt 240 lb 9.6 oz (109.1 kg)   SpO2 99%   BMI 41.30 kg/m   Psych:  Cognition and judgment appear intact. Alert, communicative  and cooperative with normal attention span and concentration. No apparent delusions, illusions, hallucinations Heart - Regular rate and rhythm.  No murmurs, gallops or rubs.    Lungs:  Normal respiratory effort, chest expands symmetrically. Lungs are clear to auscultation, no crackles or wheezes.   ASSESSMENT/PLAN:   Type 2 diabetes mellitus without complication, without long-term current use of insulin ([facility]) Assessment & Plan: Good control with stable weight.  Move up to Ozempic 1 mg weekly.  Hopefully this will help with weight loss   Orders: -     POCT glycosylated hemoglobin (Hb A1C)  Need for immunization against influenza -     Flu Vaccine QUAD 32moIM (Fluarix, Fluzone & Alfiuria Quad PF)  Chronic pain after treatment for malignant neoplasm Assessment & Plan: Not well controlled.  Have her come back and bring all her medications.  Strongly encouraged exercise and hopefully will start losing weight with ozempic which may help her pain    Other orders -     Semaglutide (1 MG/DOSE); Inject 1 mg into the skin once a week.  Dispense: 3 mL; Refill: 4 -     Gabapentin; Take 1 capsule (300 mg total) by mouth 3 (three) times daily. Take 2-3 capsules  before bed  Dispense: 270 capsule; Refill: 2 -     Zoster Vac Recomb Adjuvanted; Inject 0.5 ml IM and Repeat in 2 months  Dispense: 0.5 mL; Refill: 1     Patient Instructions  Good to see you today - Thank you for coming in  Things we discussed today:  Your diabetes is doing well Go up to 1 mg of Ozempic   Pain Bring in all your medications next visit You have Degenerative Joint Disease    I sent a prescription to your pharmacy for your Zoster vaccines to help prevent Shingles.  The shot may cause a sore arm and mild flu like symptoms for a few days.  You will need a second shot 2 months after your first.     Please always bring your medication bottles  Come back to see me in 3 months   MLind Covert MClinton

## 2022-12-19 NOTE — Patient Instructions (Signed)
Good to see you today - Thank you for coming in  Things we discussed today:  Your diabetes is doing well Go up to 1 mg of Ozempic   Pain Bring in all your medications next visit You have Degenerative Joint Disease    I sent a prescription to your pharmacy for your Zoster vaccines to help prevent Shingles.  The shot may cause a sore arm and mild flu like symptoms for a few days.  You will need a second shot 2 months after your first.     Please always bring your medication bottles  Come back to see me in 3 months

## 2022-12-20 NOTE — Assessment & Plan Note (Signed)
Not well controlled.  Have her come back and bring all her medications.  Strongly encouraged exercise and hopefully will start losing weight with ozempic which may help her pain

## 2022-12-20 NOTE — Assessment & Plan Note (Signed)
Good control with stable weight.  Move up to Ozempic 1 mg weekly.  Hopefully this will help with weight loss

## 2022-12-31 ENCOUNTER — Ambulatory Visit (INDEPENDENT_AMBULATORY_CARE_PROVIDER_SITE_OTHER): Payer: Medicare PPO | Admitting: Psychiatry

## 2022-12-31 DIAGNOSIS — F331 Major depressive disorder, recurrent, moderate: Secondary | ICD-10-CM | POA: Diagnosis not present

## 2022-12-31 DIAGNOSIS — F4310 Post-traumatic stress disorder, unspecified: Secondary | ICD-10-CM
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
## 2023-01-02 ENCOUNTER — Other Ambulatory Visit: Payer: Self-pay | Admitting: Family Medicine

## 2023-01-14 ENCOUNTER — Ambulatory Visit (INDEPENDENT_AMBULATORY_CARE_PROVIDER_SITE_OTHER): Payer: Medicare PPO | Admitting: Psychiatry

## 2023-01-14 DIAGNOSIS — F4310 Post-traumatic stress disorder, unspecified: Secondary | ICD-10-CM | POA: Diagnosis not present
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
## 2023-01-15 ENCOUNTER — Other Ambulatory Visit: Payer: Self-pay | Admitting: Psychiatry

## 2023-01-28 ENCOUNTER — Ambulatory Visit (INDEPENDENT_AMBULATORY_CARE_PROVIDER_SITE_OTHER): Payer: Medicare PPO | Admitting: Psychiatry

## 2023-01-28 DIAGNOSIS — F4310 Post-traumatic stress disorder, unspecified: Secondary | ICD-10-CM | POA: Diagnosis not present

## 2023-01-28 DIAGNOSIS — F331 Major depressive disorder, recurrent, moderate: Secondary | ICD-10-CM
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
## 2023-01-29 ENCOUNTER — Encounter: Payer: Self-pay | Admitting: Psychiatry

## 2023-01-29 ENCOUNTER — Telehealth (INDEPENDENT_AMBULATORY_CARE_PROVIDER_SITE_OTHER): Payer: Medicare PPO | Admitting: Psychiatry

## 2023-01-29 DIAGNOSIS — G4700 Insomnia, unspecified: Secondary | ICD-10-CM | POA: Diagnosis not present

## 2023-01-29 DIAGNOSIS — F331 Major depressive disorder, recurrent, moderate: Secondary | ICD-10-CM

## 2023-01-29 DIAGNOSIS — F4310 Post-traumatic stress disorder, unspecified: Secondary | ICD-10-CM | POA: Diagnosis not present

## 2023-01-29 MED ORDER — VENLAFAXINE HCL ER 37.5 MG PO CP24: 37.5000 mg | ORAL_CAPSULE | Freq: Every day | ORAL | 1 refills | Status: DC

## 2023-01-29 NOTE — Patient Instructions (Signed)
Continue venlafaxine 187.5  mg daily Continue bupropion 450 mg (300 mg + 150 mg) daily Continue Abilify 10 mg daily  Continue gabapentin 300 mg three times a day  Decrease trazodone 0-50 mg at night as needed for sleep Next appointment:3/27 at 2 pm

## 2023-02-12 ENCOUNTER — Ambulatory Visit (INDEPENDENT_AMBULATORY_CARE_PROVIDER_SITE_OTHER): Payer: Medicare PPO | Admitting: Psychiatry

## 2023-02-12 DIAGNOSIS — F331 Major depressive disorder, recurrent, moderate: Secondary | ICD-10-CM

## 2023-02-12 DIAGNOSIS — F4310 Post-traumatic stress disorder, unspecified: Secondary | ICD-10-CM | POA: Diagnosis not present
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
## 2023-02-17 ENCOUNTER — Ambulatory Visit (INDEPENDENT_AMBULATORY_CARE_PROVIDER_SITE_OTHER): Payer: Medicare PPO | Admitting: Psychiatry

## 2023-02-17 DIAGNOSIS — F331 Major depressive disorder, recurrent, moderate: Secondary | ICD-10-CM

## 2023-02-17 DIAGNOSIS — F4310 Post-traumatic stress disorder, unspecified: Secondary | ICD-10-CM
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
## 2023-02-26 ENCOUNTER — Telehealth (HOSPITAL_COMMUNITY): Payer: Self-pay | Admitting: Psychiatry

## 2023-02-26 ENCOUNTER — Ambulatory Visit (HOSPITAL_COMMUNITY): Payer: Medicare PPO | Admitting: Psychiatry

## 2023-02-26 ENCOUNTER — Ambulatory Visit (INDEPENDENT_AMBULATORY_CARE_PROVIDER_SITE_OTHER): Payer: Medicare PPO | Admitting: Family Medicine

## 2023-02-26 VITALS — BP 112/64 | HR 81 | Wt 237.0 lb

## 2023-02-26 DIAGNOSIS — R519 Headache, unspecified: Secondary | ICD-10-CM

## 2023-02-26 MED ORDER — IBUPROFEN 600 MG PO TABS: 600.0000 mg | ORAL_TABLET | Freq: Four times a day (QID) | ORAL | 0 refills | Status: DC | PRN

## 2023-02-26 MED ORDER — METHOCARBAMOL 500 MG PO TABS: 500.0000 mg | ORAL_TABLET | Freq: Four times a day (QID) | ORAL | 0 refills | Status: DC

## 2023-02-26 NOTE — Patient Instructions (Addendum)
It was wonderful to see you today.  Please bring ALL of your medications with you to every visit.   Today we talked about:  I think your pain is likely related to muscles.  I would recommend heat. Continue Ibuprofen- take every 6 hours for the next two days and then only as needed. I have refilled a short course of the muscle relaxers.   Seek immediate care if you begin to have slurred speech, one sided weakness, confusion, fevers, "worst headache of your life".   Thank you for coming to your visit as scheduled. We have had a large "no-show" problem lately, and this significantly limits our ability to see and care for patients. As a friendly reminder- if you cannot make your appointment please call to cancel. We do have a no show policy for those who do not cancel within 24 hours. Our policy is that if you miss or fail to cancel an appointment within 24 hours, 3 times in a 64-monthperiod, you may be dismissed from our clinic.   Thank you for choosing [facility]   Please call [phone] any questions about today's appointment.  Please be sure to schedule follow up at the front  desk before you leave today.   ASharion Settler DO PGY-3 Family Medicine

## 2023-02-26 NOTE — Progress Notes (Signed)
    SUBJECTIVE:   CHIEF COMPLAINT / HPI:   Jenny [name] is a 62 y.o. female who presents to the [facility] clinic today to discuss the following concerns:   "Pain in Back of Head" Started on Sunday. States that she just got dizzy. No trauma or falls. She describes it as a burning and throbbing sensation. Has been constant. She has been trying to apply pressure to the back of her head which seems to help. She took 800 mg of Ibuprofen on Monday (2 days ago) and it helped her some. She notes that when she reclined back in a chair the pain seemed to get worse after a period of time and she had to ask for assistance to get up. She put pillows under her head and back last night to help her sleep.   The pain radiates to below her ears and her neck.  It is not getting worse, just not getting better.  Denies vision changes, specifically blurry or double vision. She states she feels "foggy".  No N/V. States she feels off balance- which happens sometimes when she has vertigo but seems more consistent this time.   She also reports having a headache but that seemed to improve with the Ibuprofen.   She states that she normally has trouble with ROM of her neck anyway.    PERTINENT  PMH / PSH: Chronic pain after treatment for malignant neoplasm, class III obesity, PTSD, MDD  OBJECTIVE:   BP 112/64   Pulse 81   Wt 237 lb (107.5 kg)   SpO2 94%   BMI 40.68 kg/m    General: NAD, pleasant, able to participate in exam HEENT:  Normocephalic, ttp to occiput without any abnormalities/deformities to scalp Neck: Supple but has ttp of posterior neck muscles  Respiratory: normal effort Neuro:  Cranial Nerves: II: Visual Fields are full. PERRL.  III,IV, VI: EOMI without ptosis or diplopia.  V: Facial sensation is symmetric to temperature VII: Facial movement is symmetric.  VIII: hearing is intact to voice X: Palate elevates symmetrically XI: Shoulder shrug is symmetric. XII: tongue is midline without  atrophy or fasciculations.  Motor: Tone is normal. Bulk is normal. 5/5 strength was present in all four extremities.  Sensory: Sensation is symmetric to light touch and temperature in the face and legs. Reports some differences to sensation in RUE compared to LUE  Cerebellar: FNF and HKS are intact bilaterally Psych: Normal affect and mood  ASSESSMENT/PLAN:   1. Pain of occiput Seems most consistent with MSK etiology given reproducibility with palpation of occiput. Reassured against meningitis given no fevers, neck is supple. Neuro examination was largely unremarkable with the exception of some sensory changes to RUE compared to left. Normal gait, strength, no focal deficits. She did get tearful and anxious when asked to tandem walk and unable to do so but she had negative Romberg. Doubt CVA. No longer having headaches. No trauma/fall to suggest possible intracranial hemorrhage.  -Recommend ice, NSAID, stretching -Refilled robaxin -Discussed return precautions    Sharion Settler, [place]

## 2023-02-26 NOTE — Telephone Encounter (Signed)
Therapist attempted to contact patient via text through caregility platform for scheduled appointment, no response.  Therapist called patient, left message indicating attempt, and requesting patient call office. 

## 2023-03-11 ENCOUNTER — Encounter: Payer: Self-pay | Admitting: Family Medicine

## 2023-03-11 ENCOUNTER — Ambulatory Visit: Payer: Medicare PPO | Admitting: Family Medicine

## 2023-03-11 ENCOUNTER — Other Ambulatory Visit: Payer: Self-pay

## 2023-03-11 VITALS — BP 135/84 | HR 74 | Ht 64.0 in | Wt 239.6 lb

## 2023-03-11 DIAGNOSIS — E010 Iodine-deficiency related diffuse (endemic) goiter: Secondary | ICD-10-CM

## 2023-03-11 DIAGNOSIS — M542 Cervicalgia: Secondary | ICD-10-CM

## 2023-03-11 DIAGNOSIS — E119 Type 2 diabetes mellitus without complications: Secondary | ICD-10-CM | POA: Diagnosis not present

## 2023-03-11 LAB — POCT GLYCOSYLATED HEMOGLOBIN (HGB A1C): HbA1c, POC (controlled diabetic range): 6.9 % (ref 0.0–7.0)

## 2023-03-11 MED ORDER — SEMAGLUTIDE (2 MG/DOSE) 8 MG/3ML ~~LOC~~ SOPN: 2.0000 mg | PEN_INJECTOR | SUBCUTANEOUS | 6 refills | Status: DC

## 2023-03-11 NOTE — Assessment & Plan Note (Signed)
I am concerned she could have recurrence of cervical stenosis with myelopathy symptoms.   Refer her to Dr Hal Neer who did her surgery about 10 years ago.   If this is not the case could have cervical DJD with nerve symptoms either from neuropathy due to chemotherapy or from her large number of psychoactive drugs

## 2023-03-11 NOTE — Patient Instructions (Signed)
Good to see you today - Thank you for coming in  Things we discussed today:  Neck Pain I have put in a referral to Dr Hal Neer.  They should call you to schedule an appointment.  This could appear as an unknown number on your phone.  If you have not heard from them in 2 weeks please let me know.    Swallowing I have put in a referral to Dr Harlow Asa.  They should call you to schedule an appointment.  This could appear as an unknown number on your phone.  If you have not heard from them in 2 weeks please let me know.   You need an diabetes eye exam every year.  Please see your eye doctor.  Ask them to fax [name] a report of your exam   Diabetes Doing great.  We will go up on Ozempic   Please always bring your medication bottles  Come back to see me in 3 months

## 2023-03-11 NOTE — Assessment & Plan Note (Signed)
Good control.  Will increase GLP1 to hopefully achieve better weight control

## 2023-03-11 NOTE — Progress Notes (Signed)
    SUBJECTIVE:   CHIEF COMPLAINT / HPI:   Neck Upper Arm Pain Worsening for months.  Has pain and decreased range of motion of her neck and pain in both shoulders.  Sometimes pain seems to be burning in her mid neck She feels her balance is off.  No focal unilateral weakness or loss of sensation Taking gabapentin and ibuprofen for pain Had cervical fusion about 10 years ago   Swallowing Has trouble swallowing with choking sometimes.  Feels her goiter is larger Has not followed up with thyroid surgeon recently    OBJECTIVE:   BP 135/84   Pulse 74   Ht 5\' 4"  (1.626 m)   Wt 239 lb 9.6 oz (108.7 kg)   SpO2 99%   BMI 41.13 kg/m   Neck - tenderness in midline and marked decreased range of motion due to pain.  Significant goiter Has slight decrease in grip bilaterally with normal pulses FROM at both shoulders with mild pain  Occaisional jerking type movements of arms and legs after movements   ASSESSMENT/PLAN:   Type 2 diabetes mellitus without complication, without long-term current use of insulin (HCC) Assessment & Plan: Good control.  Will increase GLP1 to hopefully achieve better weight control   Orders: -     Microalbumin / creatinine urine ratio -     CMP14+EGFR -     POCT glycosylated hemoglobin (Hb A1C)  Neck pain, bilateral Assessment & Plan: I am concerned she could have recurrence of cervical stenosis with myelopathy symptoms.   Refer her to Dr Hal Neer who did her surgery about 10 years ago.   If this is not the case could have cervical DJD with nerve symptoms either from neuropathy due to chemotherapy or from her large number of psychoactive drugs  Orders: -     Ambulatory referral to Neurosurgery  Thyromegaly Assessment & Plan: Worsening refer her back to Dr Harlow Asa who was following   Orders: -     Ambulatory referral to General Surgery  Other orders -     Semaglutide (2 MG/DOSE); Inject 2 mg into the skin once a week.  Dispense: 9 mL; Refill:  6     Patient Instructions  Good to see you today - Thank you for coming in  Things we discussed today:  Neck Pain I have put in a referral to Dr Hal Neer.  They should call you to schedule an appointment.  This could appear as an unknown number on your phone.  If you have not heard from them in 2 weeks please let me know.    Swallowing I have put in a referral to Dr Harlow Asa.  They should call you to schedule an appointment.  This could appear as an unknown number on your phone.  If you have not heard from them in 2 weeks please let me know.   You need an diabetes eye exam every year.  Please see your eye doctor.  Ask them to fax [name] a report of your exam   Diabetes Doing great.  We will go up on Ozempic   Please always bring your medication bottles  Come back to see me in 3 months    Lind Covert, [place]

## 2023-03-11 NOTE — Assessment & Plan Note (Signed)
Worsening refer her back to Dr Harlow Asa who was following

## 2023-03-12 ENCOUNTER — Ambulatory Visit (HOSPITAL_COMMUNITY): Payer: Medicare PPO | Admitting: Psychiatry

## 2023-03-12 LAB — MICROALBUMIN / CREATININE URINE RATIO
Creatinine, Urine: 88.4 mg/dL
Microalb/Creat Ratio: 23 mg/g creat (ref 0–29)
Microalbumin, Urine: 19.9 ug/mL
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
## 2023-03-19 ENCOUNTER — Encounter: Payer: Self-pay | Admitting: Psychiatry

## 2023-03-19 ENCOUNTER — Telehealth (INDEPENDENT_AMBULATORY_CARE_PROVIDER_SITE_OTHER): Payer: Medicare PPO | Admitting: Psychiatry

## 2023-03-19 DIAGNOSIS — F4310 Post-traumatic stress disorder, unspecified: Secondary | ICD-10-CM

## 2023-03-19 DIAGNOSIS — G4700 Insomnia, unspecified: Secondary | ICD-10-CM

## 2023-03-19 DIAGNOSIS — F330 Major depressive disorder, recurrent, mild: Secondary | ICD-10-CM | POA: Diagnosis not present

## 2023-03-19 MED ORDER — ARIPIPRAZOLE 10 MG PO TABS: 10.0000 mg | ORAL_TABLET | Freq: Every day | ORAL | 0 refills | Status: DC

## 2023-03-19 NOTE — Patient Instructions (Signed)
Continue venlafaxine 187.5  mg daily  Continue bupropion 450 mg (300 mg + 150 mg) daily Continue Abilify 10 mg daily  Continue gabapentin 300 mg three times a day  Decrease trazodone 25-50 mg at night as needed for sleep Next appointment: 4/24 at 1 pm

## 2023-03-31 ENCOUNTER — Ambulatory Visit (INDEPENDENT_AMBULATORY_CARE_PROVIDER_SITE_OTHER): Payer: Medicare PPO | Admitting: Psychiatry

## 2023-03-31 DIAGNOSIS — F330 Major depressive disorder, recurrent, mild: Secondary | ICD-10-CM

## 2023-03-31 DIAGNOSIS — F4310 Post-traumatic stress disorder, unspecified: Secondary | ICD-10-CM | POA: Diagnosis not present

## 2023-03-31 DIAGNOSIS — F339 Major depressive disorder, recurrent, unspecified: Secondary | ICD-10-CM | POA: Diagnosis not present
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
## 2023-04-02 DIAGNOSIS — M5412 Radiculopathy, cervical region: Secondary | ICD-10-CM | POA: Diagnosis not present

## 2023-04-02 DIAGNOSIS — Z6841 Body Mass Index (BMI) 40.0 and over, adult: Secondary | ICD-10-CM | POA: Diagnosis not present

## 2023-04-04 ENCOUNTER — Other Ambulatory Visit: Payer: Self-pay | Admitting: Neurosurgery

## 2023-04-04 DIAGNOSIS — M5412 Radiculopathy, cervical region: Secondary | ICD-10-CM

## 2023-04-12 ENCOUNTER — Other Ambulatory Visit: Payer: Self-pay | Admitting: Psychiatry

## 2023-04-12 DIAGNOSIS — F330 Major depressive disorder, recurrent, mild: Secondary | ICD-10-CM

## 2023-04-12 DIAGNOSIS — F4310 Post-traumatic stress disorder, unspecified: Secondary | ICD-10-CM
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
## 2023-04-15 ENCOUNTER — Ambulatory Visit (INDEPENDENT_AMBULATORY_CARE_PROVIDER_SITE_OTHER): Payer: Medicare PPO | Admitting: Psychiatry

## 2023-04-15 ENCOUNTER — Telehealth (HOSPITAL_COMMUNITY): Payer: Self-pay | Admitting: Psychiatry

## 2023-04-15 DIAGNOSIS — F4310 Post-traumatic stress disorder, unspecified: Secondary | ICD-10-CM

## 2023-04-15 DIAGNOSIS — F339 Major depressive disorder, recurrent, unspecified: Secondary | ICD-10-CM

## 2023-04-15 DIAGNOSIS — F330 Major depressive disorder, recurrent, mild: Secondary | ICD-10-CM

## 2023-04-15 NOTE — Telephone Encounter (Signed)
Therapist attempted to contact patient via text through caregility platform for scheduled appointment, no response.  Therapist called patient, left message indicating attempt, and requesting patient call office. 
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
## 2023-04-16 ENCOUNTER — Encounter: Payer: Self-pay | Admitting: Psychiatry

## 2023-04-16 ENCOUNTER — Telehealth (INDEPENDENT_AMBULATORY_CARE_PROVIDER_SITE_OTHER): Payer: Medicare PPO | Admitting: Psychiatry

## 2023-04-16 DIAGNOSIS — G4700 Insomnia, unspecified: Secondary | ICD-10-CM

## 2023-04-16 DIAGNOSIS — F330 Major depressive disorder, recurrent, mild: Secondary | ICD-10-CM | POA: Diagnosis not present

## 2023-04-16 DIAGNOSIS — F4310 Post-traumatic stress disorder, unspecified: Secondary | ICD-10-CM

## 2023-04-16 MED ORDER — BUPROPION HCL ER (XL) 300 MG PO TB24: 300.0000 mg | ORAL_TABLET | Freq: Every day | ORAL | 1 refills | Status: DC

## 2023-04-16 MED ORDER — BUPROPION HCL ER (XL) 150 MG PO TB24: 150.0000 mg | ORAL_TABLET | Freq: Every day | ORAL | 1 refills | Status: DC

## 2023-04-16 NOTE — Patient Instructions (Signed)
Continue venlafaxine 187.5  mg daily  Continue bupropion 450 mg (300 mg + 150 mg) daily Continue Abilify 10 mg daily  Continue gabapentin 300 mg twice a day  Continue trazodone 25-50 mg at night as needed for sleep Next appointment: 5/31 at 9 am

## 2023-04-29 ENCOUNTER — Ambulatory Visit (INDEPENDENT_AMBULATORY_CARE_PROVIDER_SITE_OTHER): Payer: Medicare PPO | Admitting: Psychiatry

## 2023-04-29 DIAGNOSIS — F330 Major depressive disorder, recurrent, mild: Secondary | ICD-10-CM

## 2023-04-29 DIAGNOSIS — F4310 Post-traumatic stress disorder, unspecified: Secondary | ICD-10-CM

## 2023-04-29 DIAGNOSIS — F339 Major depressive disorder, recurrent, unspecified: Secondary | ICD-10-CM
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
## 2023-05-06 ENCOUNTER — Ambulatory Visit
Admission: RE | Admit: 2023-05-06 | Discharge: 2023-05-06 | Disposition: A | Discharge status: Home / Self Care | Payer: Medicare PPO | Source: Ambulatory Visit | Attending: Neurosurgery | Admitting: Neurosurgery

## 2023-05-06 DIAGNOSIS — M25512 Pain in left shoulder: Secondary | ICD-10-CM | POA: Diagnosis not present

## 2023-05-06 DIAGNOSIS — M4802 Spinal stenosis, cervical region: Secondary | ICD-10-CM | POA: Diagnosis not present

## 2023-05-06 DIAGNOSIS — R202 Paresthesia of skin: Secondary | ICD-10-CM | POA: Diagnosis not present

## 2023-05-06 DIAGNOSIS — M5412 Radiculopathy, cervical region: Secondary | ICD-10-CM

## 2023-05-06 DIAGNOSIS — M25511 Pain in right shoulder: Secondary | ICD-10-CM | POA: Diagnosis not present

## 2023-05-14 DIAGNOSIS — M5412 Radiculopathy, cervical region: Secondary | ICD-10-CM | POA: Diagnosis not present

## 2023-05-14 DIAGNOSIS — M4802 Spinal stenosis, cervical region: Secondary | ICD-10-CM | POA: Diagnosis not present

## 2023-05-14 DIAGNOSIS — Z6841 Body Mass Index (BMI) 40.0 and over, adult: Secondary | ICD-10-CM | POA: Diagnosis not present

## 2023-05-14 DIAGNOSIS — G992 Myelopathy in diseases classified elsewhere: Secondary | ICD-10-CM | POA: Diagnosis not present

## 2023-05-23 ENCOUNTER — Telehealth: Payer: Medicare PPO | Admitting: Psychiatry
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
## 2023-05-29 ENCOUNTER — Ambulatory Visit (INDEPENDENT_AMBULATORY_CARE_PROVIDER_SITE_OTHER): Payer: Medicare PPO | Admitting: Psychiatry

## 2023-05-29 DIAGNOSIS — F339 Major depressive disorder, recurrent, unspecified: Secondary | ICD-10-CM

## 2023-05-29 DIAGNOSIS — F330 Major depressive disorder, recurrent, mild: Secondary | ICD-10-CM

## 2023-05-29 DIAGNOSIS — F4310 Post-traumatic stress disorder, unspecified: Secondary | ICD-10-CM | POA: Diagnosis not present
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
## 2023-05-30 ENCOUNTER — Other Ambulatory Visit: Payer: Self-pay | Admitting: Neurosurgery

## 2023-06-04 ENCOUNTER — Encounter: Payer: Self-pay | Admitting: Psychiatry

## 2023-06-04 ENCOUNTER — Telehealth (INDEPENDENT_AMBULATORY_CARE_PROVIDER_SITE_OTHER): Payer: Medicare PPO | Admitting: Psychiatry

## 2023-06-04 DIAGNOSIS — G4700 Insomnia, unspecified: Secondary | ICD-10-CM | POA: Diagnosis not present

## 2023-06-04 DIAGNOSIS — F4310 Post-traumatic stress disorder, unspecified: Secondary | ICD-10-CM

## 2023-06-04 DIAGNOSIS — F330 Major depressive disorder, recurrent, mild: Secondary | ICD-10-CM | POA: Diagnosis not present

## 2023-06-04 MED ORDER — VENLAFAXINE HCL ER 150 MG PO CP24: 150.0000 mg | ORAL_CAPSULE | Freq: Every day | ORAL | 1 refills | Status: DC

## 2023-06-04 MED ORDER — ARIPIPRAZOLE 10 MG PO TABS: 10.0000 mg | ORAL_TABLET | Freq: Every day | ORAL | 0 refills | Status: DC

## 2023-06-04 MED ORDER — TRAZODONE HCL 50 MG PO TABS: 50.0000 mg | ORAL_TABLET | Freq: Every evening | ORAL | 1 refills | Status: DC | PRN

## 2023-06-05 ENCOUNTER — Other Ambulatory Visit (HOSPITAL_COMMUNITY): Payer: Medicare PPO

## 2023-06-05 NOTE — Pre-Procedure Instructions (Signed)
Surgical Instructions   Your procedure is scheduled on Tuesday, June 18th. Report to [facility] Main Entrance "A" at 1:15 P.M., then check in with the Admitting office. Any questions or running late day of surgery: call [phone]  Questions prior to your surgery date: call [phone], Monday-Friday, 8am-4pm. If you experience any cold or flu symptoms such as cough, fever, chills, shortness of breath, etc. between now and your scheduled surgery, please notify us at the above number.     Remember:  Do not eat or drink after midnight the night before your surgery     Take these medicines the morning of surgery with A SIP OF WATER  ARIPiprazole (ABILIFY)  atorvastatin (LIPITOR)  buPROPion (WELLBUTRIN XL)  gabapentin (NEURONTIN)  methocarbamol (ROBAXIN)  venlafaxine XR (EFFEXOR-XR)       One week prior to surgery, STOP taking any Aspirin (unless otherwise instructed by your surgeon) Aleve, Naproxen, Ibuprofen, Motrin, Advil, Goody's, BC's, all herbal medications, fish oil, diclofenac Sodium (VOLTAREN) gel and non-prescription vitamins.          WHAT DO I DO ABOUT MY DIABETES MEDICATION?   Do not take metFORMIN (GLUCOPHAGE-XR) the morning of surgery.  HOLD JARDIANCE 72 HOURS PRIOR TO SURGERY. Last dose 6/14.       HOLD SEMAGLUTIDE 7 DAYS PRIOR TO SURGERY. Last dose on or before 6/10.     HOW TO MANAGE YOUR DIABETES BEFORE AND AFTER SURGERY  Why is it important to control my blood sugar before and after surgery? Improving blood sugar levels before and after surgery helps healing and can limit problems. A way of improving blood sugar control is eating a healthy diet by:  Eating less sugar and carbohydrates  Increasing activity/exercise  Talking with your doctor about reaching your blood sugar goals High blood sugars (greater than 180 mg/dL) can raise your risk of infections and slow your recovery, so you will need to focus on controlling your diabetes during the weeks  before surgery. Make sure that the doctor who takes care of your diabetes knows about your planned surgery including the date and location.  How do I manage my blood sugar before surgery? Check your blood sugar at least 4 times a day, starting 2 days before surgery, to make sure that the level is not too high or low.  Check your blood sugar the morning of your surgery when you wake up and every 2 hours until you get to the Short Stay unit.  If your blood sugar is less than 70 mg/dL, you will need to treat for low blood sugar: Do not take insulin. Treat a low blood sugar (less than 70 mg/dL) with  cup of clear juice (cranberry or apple), 4 glucose tablets, OR glucose gel. Recheck blood sugar in 15 minutes after treatment (to make sure it is greater than 70 mg/dL). If your blood sugar is not greater than 70 mg/dL on recheck, call [phone] for further instructions. Report your blood sugar to the short stay nurse when you get to Short Stay.  If you are admitted to the hospital after surgery: Your blood sugar will be checked by the staff and you will probably be given insulin after surgery (instead of oral diabetes medicines) to make sure you have good blood sugar levels. The goal for blood sugar control after surgery is 80-180 mg/dL.             Do NOT Smoke (Tobacco/Vaping) for 24 hours prior to your procedure.  If you use a  CPAP at night, you may bring your mask/headgear for your overnight stay.   You will be asked to remove any contacts, glasses, piercing's, hearing aid's, dentures/partials prior to surgery. Please bring cases for these items if needed.    Patients discharged the day of surgery will not be allowed to drive home, and someone needs to stay with them for 24 hours.  SURGICAL WAITING ROOM VISITATION Patients may have no more than 2 support people in the waiting area - these visitors may rotate.   Pre-op nurse will coordinate an appropriate time for 1 ADULT support person,  who may not rotate, to accompany patient in pre-op.  Children under the age of 80 must have an adult with them who is not the patient and must remain in the main waiting area with an adult.  If the patient needs to stay at the hospital during part of their recovery, the visitor guidelines for inpatient rooms apply.  Please refer to the [facility] website for the visitor guidelines for any additional information.   If you received a COVID test during your pre-op visit  it is requested that you wear a mask when out in public, stay away from anyone that may not be feeling well and notify your surgeon if you develop symptoms. If you have been in contact with anyone that has tested positive in the last 10 days please notify you surgeon.      Pre-operative 5 CHG Bath Instructions   You can play a key role in reducing the risk of infection after surgery. Your skin needs to be as free of germs as possible. You can reduce the number of germs on your skin by washing with CHG (chlorhexidine gluconate) soap before surgery. CHG is an antiseptic soap that kills germs and continues to kill germs even after washing.   DO NOT use if you have an allergy to chlorhexidine/CHG or antibacterial soaps. If your skin becomes reddened or irritated, stop using the CHG and notify one of our RNs at [phone].   Please shower with the CHG soap starting 4 days before surgery using the following schedule:     Please keep in mind the following:  DO NOT shave, including legs and underarms, starting the day of your first shower.   You may shave your face at any point before/day of surgery.  Place clean sheets on your bed the day you start using CHG soap. Use a clean washcloth (not used since being washed) for each shower. DO NOT sleep with pets once you start using the CHG.   CHG Shower Instructions:  If you choose to wash your hair and private area, wash first with your normal shampoo/soap.  After you use  shampoo/soap, rinse your hair and body thoroughly to remove shampoo/soap residue.  Turn the water OFF and apply about 3 tablespoons (45 ml) of CHG soap to a CLEAN washcloth.  Apply CHG soap ONLY FROM YOUR NECK DOWN TO YOUR TOES (washing for 3-5 minutes)  DO NOT use CHG soap on face, private areas, open wounds, or sores.  Pay special attention to the area where your surgery is being performed.  If you are having back surgery, having someone wash your back for you may be helpful. Wait 2 minutes after CHG soap is applied, then you may rinse off the CHG soap.  Pat dry with a clean towel  Put on clean clothes/pajamas   If you choose to wear lotion, please use ONLY the CHG-compatible lotions on  the back of this paper.   Additional instructions for the day of surgery: DO NOT APPLY any lotions, deodorants, cologne, or perfumes.   Do not bring valuables to the hospital. [facility] is not responsible for any belongings/valuables. Do not wear nail polish, gel polish, artificial nails, or any other type of covering on natural nails (fingers and toes) Do not wear jewelry or makeup Put on clean/comfortable clothes.  Please brush your teeth.  Ask your nurse before applying any prescription medications to the skin.     CHG Compatible Lotions   Aveeno Moisturizing lotion  Cetaphil Moisturizing Cream  Cetaphil Moisturizing Lotion  Clairol Herbal Essence Moisturizing Lotion, Dry Skin  Clairol Herbal Essence Moisturizing Lotion, Extra Dry Skin  Clairol Herbal Essence Moisturizing Lotion, Normal Skin  Curel Age Defying Therapeutic Moisturizing Lotion with Alpha Hydroxy  Curel Extreme Care Body Lotion  Curel Soothing Hands Moisturizing Hand Lotion  Curel Therapeutic Moisturizing Cream, Fragrance-Free  Curel Therapeutic Moisturizing Lotion, Fragrance-Free  Curel Therapeutic Moisturizing Lotion, Original Formula  Eucerin Daily Replenishing Lotion  Eucerin Dry Skin Therapy Plus Alpha Hydroxy Crme   Eucerin Dry Skin Therapy Plus Alpha Hydroxy Lotion  Eucerin Original Crme  Eucerin Original Lotion  Eucerin Plus Crme Eucerin Plus Lotion  Eucerin TriLipid Replenishing Lotion  Keri Anti-Bacterial Hand Lotion  Keri Deep Conditioning Original Lotion Dry Skin Formula Softly Scented  Keri Deep Conditioning Original Lotion, Fragrance Free Sensitive Skin Formula  Keri Lotion Fast Absorbing Fragrance Free Sensitive Skin Formula  Keri Lotion Fast Absorbing Softly Scented Dry Skin Formula  Keri Original Lotion  Keri Skin Renewal Lotion Keri Silky Smooth Lotion  Keri Silky Smooth Sensitive Skin Lotion  Nivea Body Creamy Conditioning Oil  Nivea Body Extra Enriched Lotion  Nivea Body Original Lotion  Nivea Body Sheer Moisturizing Lotion Nivea Crme  Nivea Skin Firming Lotion  NutraDerm 30 Skin Lotion  NutraDerm Skin Lotion  NutraDerm Therapeutic Skin Cream  NutraDerm Therapeutic Skin Lotion  ProShield Protective Hand Cream  Provon moisturizing lotion  Please read over the following fact sheets that you were given.

## 2023-06-06 ENCOUNTER — Other Ambulatory Visit: Payer: Self-pay

## 2023-06-06 ENCOUNTER — Encounter (HOSPITAL_COMMUNITY)
Admission: RE | Admit: 2023-06-06 | Discharge: 2023-06-06 | Disposition: A | Discharge status: Home / Self Care | Payer: Medicare PPO | Source: Ambulatory Visit | Attending: Neurosurgery | Admitting: Neurosurgery

## 2023-06-06 ENCOUNTER — Encounter (HOSPITAL_COMMUNITY): Payer: Self-pay

## 2023-06-06 VITALS — BP 147/87 | HR 73 | Temp 98.0°F | Resp 18 | Ht 65.0 in | Wt 233.0 lb

## 2023-06-06 DIAGNOSIS — E119 Type 2 diabetes mellitus without complications: Secondary | ICD-10-CM | POA: Diagnosis not present

## 2023-06-06 DIAGNOSIS — Z01818 Encounter for other preprocedural examination: Secondary | ICD-10-CM | POA: Diagnosis not present

## 2023-06-06 DIAGNOSIS — K760 Fatty (change of) liver, not elsewhere classified: Secondary | ICD-10-CM | POA: Insufficient documentation

## 2023-06-06 HISTORY — DX: Depression, unspecified: F32.A

## 2023-06-06 HISTORY — DX: Fatty (change of) liver, not elsewhere classified: K76.0

## 2023-06-06 HISTORY — DX: Anxiety disorder, unspecified: F41.9

## 2023-06-06 HISTORY — DX: Unspecified osteoarthritis, unspecified site: M19.90

## 2023-06-06 LAB — COMPREHENSIVE METABOLIC PANEL
ALT: 18 U/L (ref 0–44)
AST: 14 U/L — ABNORMAL LOW (ref 15–41)
Albumin: 3.8 g/dL (ref 3.5–5.0)
Alkaline Phosphatase: 63 U/L (ref 38–126)
Anion gap: 9 (ref 5–15)
BUN: 11 mg/dL (ref 6–20)
CO2: 23 mmol/L (ref 22–32)
Calcium: 9.3 mg/dL (ref 8.9–10.3)
Chloride: 107 mmol/L (ref 98–111)
Creatinine, Ser: 0.72 mg/dL (ref 0.44–1.00)
GFR, Estimated: >60 mL/min (ref >60)
Glucose, Bld: 107 mg/dL — ABNORMAL HIGH (ref 70–99)
Potassium: 3.8 mmol/L (ref 3.5–5.1)
Sodium: 139 mmol/L (ref 135–145)
Total Bilirubin: 0.4 mg/dL (ref 0.3–1.2)
Total Protein: 7.1 g/dL (ref 6.5–8.1)

## 2023-06-06 LAB — SURGICAL PCR SCREEN
MRSA, PCR: NEGATIVE
Staphylococcus aureus: NEGATIVE

## 2023-06-06 LAB — CBC
HCT: 41.5 % (ref 36.0–46.0)
Hemoglobin: 13.2 g/dL (ref 12.0–15.0)
MCH: 26.9 pg (ref 26.0–34.0)
MCHC: 31.8 g/dL (ref 30.0–36.0)
MCV: 84.7 fL (ref 80.0–100.0)
Platelets: 252 10*3/uL (ref 150–400)
RBC: 4.9 MIL/uL (ref 3.87–5.11)
RDW: 15.9 % — ABNORMAL HIGH (ref 11.5–15.5)
WBC: 6.1 10*3/uL (ref 4.0–10.5)
nRBC: 0 % (ref 0.0–0.2)

## 2023-06-06 LAB — GLUCOSE, CAPILLARY: Glucose-Capillary: 119 mg/dL — ABNORMAL HIGH (ref 70–99)

## 2023-06-06 LAB — HEMOGLOBIN A1C
Hgb A1c MFr Bld: 6.6 % — ABNORMAL HIGH (ref 4.8–5.6)
Mean Plasma Glucose: 142.72 mg/dL

## 2023-06-06 NOTE — Progress Notes (Signed)
PCP - Dr. Pearlean Brownie Cardiologist - denies  PPM/ICD - denies   Chest x-ray - 10/15/21 EKG - 06/06/23 Stress Test - denies ECHO - 07/31/18 Cardiac Cath - denies  Sleep Study - 03/28/22, OSA- CPAP - denies  Fasting Blood Sugar - 100-200 Pt states she does not check CBG at home  Last dose of GLP1 agonist-  6/11 (Anesthesia aware)   ASA/Blood Thinner Instructions: n/a   ERAS Protcol - no, NPO    COVID TEST- n/a   Anesthesia review: no  Patient denies shortness of breath, fever, cough and chest pain at PAT appointment   All instructions explained to the patient, with a verbal understanding of the material. Patient agrees to go over the instructions while at home for a better understanding.  The opportunity to ask questions was provided.

## 2023-06-11 ENCOUNTER — Encounter (HOSPITAL_COMMUNITY): Payer: Self-pay | Admitting: Neurosurgery

## 2023-06-11 NOTE — Progress Notes (Signed)
SDW call  Patient had PAT appointment on 06/06/2023. Surgical instructions were reviewed, blood and EKG was obtained. Surgery date was changed from 06/09/2023 to 06/12/2023.  Spoke with patient on the phone to inform her of arrival time of 23.  She has had no new changes to her medications.  All previous instructions with medications to take and hold the day of surgery to remain the same.  Reviewed to hold Metformin DOS.  Patient states last dose of Jardiance was 06/09/2023 and last dose of Ozemic was 06/02/2023.

## 2023-06-11 NOTE — Anesthesia Preprocedure Evaluation (Signed)
Anesthesia Evaluation  Patient identified by MRN, date of birth, ID band Patient awake    Reviewed: Allergy & Precautions, H&P , NPO status , Patient's Chart, lab work & pertinent test results  Airway Mallampati: III  TM Distance: >3 FB Neck ROM: Full    Dental no notable dental hx. (+) Teeth Intact, Dental Advisory Given   Pulmonary neg pulmonary ROS   Pulmonary exam normal breath sounds clear to auscultation       Cardiovascular Exercise Tolerance: Good negative cardio ROS  Rhythm:Regular Rate:Normal     Neuro/Psych   Anxiety Depression    negative neurological ROS     GI/Hepatic negative GI ROS, Neg liver ROS,,,  Endo/Other  diabetes, Type 2, Oral Hypoglycemic Agents  Morbid obesity  Renal/GU negative Renal ROS  negative genitourinary   Musculoskeletal  (+) Arthritis , Osteoarthritis,    Abdominal   Peds  Hematology  (+) Blood dyscrasia, anemia   Anesthesia Other Findings   Reproductive/Obstetrics negative OB ROS                             Anesthesia Physical Anesthesia Plan  ASA: 3  Anesthesia Plan: General   Post-op Pain Management: Tylenol PO (pre-op)*   Induction: Intravenous  PONV Risk Score and Plan: 4 or greater and Ondansetron, Dexamethasone and Midazolam  Airway Management Planned: Oral ETT and Video Laryngoscope Planned  Additional Equipment:   Intra-op Plan:   Post-operative Plan: Extubation in OR  Informed Consent: I have reviewed the patients History and Physical, chart, labs and discussed the procedure including the risks, benefits and alternatives for the proposed anesthesia with the patient or authorized representative who has indicated his/her understanding and acceptance.     Dental advisory given  Plan Discussed with: CRNA  Anesthesia Plan Comments:        Anesthesia Quick Evaluation

## 2023-06-12 ENCOUNTER — Ambulatory Visit (HOSPITAL_COMMUNITY): Payer: Medicare PPO | Admitting: Certified Registered Nurse Anesthetist

## 2023-06-12 ENCOUNTER — Ambulatory Visit (HOSPITAL_COMMUNITY)
Admission: RE | Disposition: A | Discharge status: Home / Self Care | Payer: Self-pay | Source: Home / Self Care | Attending: Neurosurgery

## 2023-06-12 ENCOUNTER — Ambulatory Visit (HOSPITAL_COMMUNITY): Payer: Medicare PPO

## 2023-06-12 ENCOUNTER — Encounter (HOSPITAL_COMMUNITY): Payer: Self-pay | Admitting: Neurosurgery

## 2023-06-12 ENCOUNTER — Observation Stay (HOSPITAL_COMMUNITY)
Admission: RE | Admit: 2023-06-12 | Discharge: 2023-06-13 | Disposition: A | Discharge status: Home / Self Care | Payer: Medicare PPO | Source: Home / Self Care | Attending: Neurosurgery | Admitting: Neurosurgery

## 2023-06-12 ENCOUNTER — Ambulatory Visit (HOSPITAL_BASED_OUTPATIENT_CLINIC_OR_DEPARTMENT_OTHER): Payer: Medicare PPO | Admitting: Certified Registered Nurse Anesthetist

## 2023-06-12 ENCOUNTER — Ambulatory Visit (HOSPITAL_COMMUNITY): Payer: Medicare PPO | Admitting: Psychiatry

## 2023-06-12 ENCOUNTER — Other Ambulatory Visit: Payer: Self-pay

## 2023-06-12 DIAGNOSIS — M4802 Spinal stenosis, cervical region: Secondary | ICD-10-CM | POA: Insufficient documentation

## 2023-06-12 DIAGNOSIS — Z6841 Body Mass Index (BMI) 40.0 and over, adult: Secondary | ICD-10-CM | POA: Diagnosis not present

## 2023-06-12 DIAGNOSIS — E119 Type 2 diabetes mellitus without complications: Secondary | ICD-10-CM | POA: Insufficient documentation

## 2023-06-12 DIAGNOSIS — M4712 Other spondylosis with myelopathy, cervical region: Secondary | ICD-10-CM | POA: Insufficient documentation

## 2023-06-12 DIAGNOSIS — Z0389 Encounter for observation for other suspected diseases and conditions ruled out: Secondary | ICD-10-CM | POA: Diagnosis not present

## 2023-06-12 DIAGNOSIS — Z90711 Acquired absence of uterus with remaining cervical stump: Secondary | ICD-10-CM | POA: Diagnosis not present

## 2023-06-12 DIAGNOSIS — M4722 Other spondylosis with radiculopathy, cervical region: Secondary | ICD-10-CM | POA: Diagnosis present

## 2023-06-12 DIAGNOSIS — Z9221 Personal history of antineoplastic chemotherapy: Secondary | ICD-10-CM | POA: Diagnosis not present

## 2023-06-12 DIAGNOSIS — M5001 Cervical disc disorder with myelopathy,  high cervical region: Secondary | ICD-10-CM | POA: Diagnosis not present

## 2023-06-12 DIAGNOSIS — G959 Disease of spinal cord, unspecified: Secondary | ICD-10-CM | POA: Diagnosis not present

## 2023-06-12 DIAGNOSIS — E041 Nontoxic single thyroid nodule: Secondary | ICD-10-CM | POA: Diagnosis present

## 2023-06-12 DIAGNOSIS — Z818 Family history of other mental and behavioral disorders: Secondary | ICD-10-CM | POA: Diagnosis not present

## 2023-06-12 DIAGNOSIS — Z7985 Long-term (current) use of injectable non-insulin antidiabetic drugs: Secondary | ICD-10-CM | POA: Diagnosis not present

## 2023-06-12 DIAGNOSIS — Z91013 Allergy to seafood: Secondary | ICD-10-CM | POA: Diagnosis not present

## 2023-06-12 DIAGNOSIS — C772 Secondary and unspecified malignant neoplasm of intra-abdominal lymph nodes: Secondary | ICD-10-CM | POA: Diagnosis present

## 2023-06-12 DIAGNOSIS — Z7984 Long term (current) use of oral hypoglycemic drugs: Secondary | ICD-10-CM

## 2023-06-12 DIAGNOSIS — F418 Other specified anxiety disorders: Secondary | ICD-10-CM | POA: Diagnosis not present

## 2023-06-12 DIAGNOSIS — Z85038 Personal history of other malignant neoplasm of large intestine: Secondary | ICD-10-CM | POA: Diagnosis not present

## 2023-06-12 DIAGNOSIS — K760 Fatty (change of) liver, not elsewhere classified: Secondary | ICD-10-CM | POA: Diagnosis present

## 2023-06-12 DIAGNOSIS — E7800 Pure hypercholesterolemia, unspecified: Secondary | ICD-10-CM | POA: Diagnosis present

## 2023-06-12 DIAGNOSIS — R1310 Dysphagia, unspecified: Secondary | ICD-10-CM | POA: Diagnosis present

## 2023-06-12 DIAGNOSIS — J384 Edema of larynx: Secondary | ICD-10-CM | POA: Diagnosis not present

## 2023-06-12 DIAGNOSIS — M48061 Spinal stenosis, lumbar region without neurogenic claudication: Secondary | ICD-10-CM | POA: Diagnosis not present

## 2023-06-12 DIAGNOSIS — F4310 Post-traumatic stress disorder, unspecified: Secondary | ICD-10-CM | POA: Diagnosis present

## 2023-06-12 DIAGNOSIS — Z79899 Other long term (current) drug therapy: Secondary | ICD-10-CM | POA: Insufficient documentation

## 2023-06-12 DIAGNOSIS — Z981 Arthrodesis status: Secondary | ICD-10-CM | POA: Diagnosis not present

## 2023-06-12 DIAGNOSIS — J392 Other diseases of pharynx: Secondary | ICD-10-CM | POA: Diagnosis not present

## 2023-06-12 DIAGNOSIS — Z8249 Family history of ischemic heart disease and other diseases of the circulatory system: Secondary | ICD-10-CM | POA: Diagnosis not present

## 2023-06-12 HISTORY — PX: ANTERIOR CERVICAL DECOMP/DISCECTOMY FUSION: SHX1161

## 2023-06-12 LAB — GLUCOSE, CAPILLARY
Glucose-Capillary: 79 mg/dL (ref 70–99)
Glucose-Capillary: 141 mg/dL — ABNORMAL HIGH (ref 70–99)
Glucose-Capillary: 178 mg/dL — ABNORMAL HIGH (ref 70–99)
Glucose-Capillary: 180 mg/dL — ABNORMAL HIGH (ref 70–99)
Glucose-Capillary: 142 mg/dL — ABNORMAL HIGH (ref 70–99)

## 2023-06-12 SURGERY — ANTERIOR CERVICAL DECOMPRESSION/DISCECTOMY FUSION 1 LEVEL
Anesthesia: General | Site: Spine Cervical

## 2023-06-12 MED ORDER — ACETAMINOPHEN 325 MG PO TABS: 650.0000 mg | ORAL_TABLET | ORAL | Status: DC | PRN

## 2023-06-12 MED ORDER — VENLAFAXINE HCL ER 75 MG PO CP24: 150.0000 mg | ORAL_CAPSULE | Freq: Every day | ORAL | Status: DC

## 2023-06-12 MED ORDER — THROMBIN (RECOMBINANT) 5000 UNITS EX SOLR: CUTANEOUS | Status: DC | PRN

## 2023-06-12 MED ORDER — VENLAFAXINE HCL ER 37.5 MG PO CP24
37.5000 mg | ORAL_CAPSULE | Freq: Every day | ORAL | Status: DC
  Filled 2023-06-12 (×2): qty 1

## 2023-06-12 MED ORDER — HYDROMORPHONE HCL 1 MG/ML IJ SOLN
0.2500 mg | INTRAMUSCULAR | Status: DC | PRN
  Administered 2023-06-12 (×4): 0.25 mg via INTRAVENOUS

## 2023-06-12 MED ORDER — ONDANSETRON HCL 4 MG/2ML IJ SOLN
4.0000 mg | Freq: Four times a day (QID) | INTRAMUSCULAR | Status: DC | PRN
  Filled 2023-06-12: qty 2

## 2023-06-12 MED ORDER — THROMBIN 5000 UNITS EX SOLR
CUTANEOUS | Status: AC
  Filled 2023-06-12: qty 10000

## 2023-06-12 MED ORDER — MIDAZOLAM HCL 2 MG/2ML IJ SOLN
INTRAMUSCULAR | Status: DC | PRN
  Administered 2023-06-12: 2 mg via INTRAVENOUS

## 2023-06-12 MED ORDER — ORAL CARE MOUTH RINSE: 15.0000 mL | Freq: Once | OROMUCOSAL | Status: AC

## 2023-06-12 MED ORDER — TRAZODONE HCL 50 MG PO TABS: 50.0000 mg | ORAL_TABLET | Freq: Every evening | ORAL | Status: DC | PRN

## 2023-06-12 MED ORDER — MENTHOL 3 MG MT LOZG: 1.0000 | LOZENGE | OROMUCOSAL | Status: DC | PRN

## 2023-06-12 MED ORDER — PROPOFOL 10 MG/ML IV BOLUS
INTRAVENOUS | Status: AC
  Filled 2023-06-12: qty 20

## 2023-06-12 MED ORDER — CEFAZOLIN SODIUM-DEXTROSE 2-4 GM/100ML-% IV SOLN
2.0000 g | INTRAVENOUS | Status: AC
  Administered 2023-06-12: 2 g via INTRAVENOUS
  Filled 2023-06-12: qty 100

## 2023-06-12 MED ORDER — DEXAMETHASONE SODIUM PHOSPHATE 10 MG/ML IJ SOLN
INTRAMUSCULAR | Status: DC | PRN
  Administered 2023-06-12: 10 mg via INTRAVENOUS

## 2023-06-12 MED ORDER — ACETAMINOPHEN 650 MG RE SUPP: 650.0000 mg | RECTAL | Status: DC | PRN

## 2023-06-12 MED ORDER — ARIPIPRAZOLE 10 MG PO TABS: 10.0000 mg | ORAL_TABLET | Freq: Every day | ORAL | Status: DC

## 2023-06-12 MED ORDER — HYDROCODONE-ACETAMINOPHEN 5-325 MG PO TABS: 1.0000 | ORAL_TABLET | ORAL | Status: DC | PRN

## 2023-06-12 MED ORDER — HYDROCODONE-ACETAMINOPHEN 10-325 MG PO TABS
1.0000 | ORAL_TABLET | ORAL | Status: DC | PRN
  Administered 2023-06-12 – 2023-06-13 (×4): 1 via ORAL
  Filled 2023-06-12 (×4): qty 1

## 2023-06-12 MED ORDER — SEMAGLUTIDE (2 MG/DOSE) 8 MG/3ML ~~LOC~~ SOPN: 2.0000 mg | PEN_INJECTOR | SUBCUTANEOUS | Status: DC

## 2023-06-12 MED ORDER — PHENYLEPHRINE HCL-NACL 20-0.9 MG/250ML-% IV SOLN
INTRAVENOUS | Status: DC | PRN
  Administered 2023-06-12: 30 ug/min via INTRAVENOUS

## 2023-06-12 MED ORDER — LIDOCAINE 2% (20 MG/ML) 5 ML SYRINGE
INTRAMUSCULAR | Status: DC | PRN
  Administered 2023-06-12: 100 mg via INTRAVENOUS

## 2023-06-12 MED ORDER — ATORVASTATIN CALCIUM 10 MG PO TABS
20.0000 mg | ORAL_TABLET | Freq: Every day | ORAL | Status: DC
  Administered 2023-06-12 – 2023-06-13 (×2): 20 mg via ORAL
  Filled 2023-06-12 (×2): qty 2

## 2023-06-12 MED ORDER — ROCURONIUM BROMIDE 10 MG/ML (PF) SYRINGE
PREFILLED_SYRINGE | INTRAVENOUS | Status: DC | PRN
  Administered 2023-06-12: 100 mg via INTRAVENOUS

## 2023-06-12 MED ORDER — FLUTICASONE PROPIONATE 50 MCG/ACT NA SUSP: 2.0000 | Freq: Every day | NASAL | Status: DC | PRN

## 2023-06-12 MED ORDER — SODIUM CHLORIDE 0.9 % IV SOLN: 250.0000 mL | INTRAVENOUS | Status: DC

## 2023-06-12 MED ORDER — HYDROMORPHONE HCL 1 MG/ML IJ SOLN: 1.0000 mg | INTRAMUSCULAR | Status: DC | PRN

## 2023-06-12 MED ORDER — LACTATED RINGERS IV SOLN: INTRAVENOUS | Status: DC

## 2023-06-12 MED ORDER — CLINDAMYCIN HCL 150 MG PO CAPS: 150.0000 mg | ORAL_CAPSULE | Freq: Four times a day (QID) | ORAL | Status: DC

## 2023-06-12 MED ORDER — METFORMIN HCL ER 500 MG PO TB24
500.0000 mg | ORAL_TABLET | Freq: Two times a day (BID) | ORAL | Status: DC
  Administered 2023-06-12 – 2023-06-13 (×2): 500 mg via ORAL
  Filled 2023-06-12 (×2): qty 1

## 2023-06-12 MED ORDER — ONDANSETRON HCL 4 MG PO TABS: 4.0000 mg | ORAL_TABLET | Freq: Four times a day (QID) | ORAL | Status: DC | PRN

## 2023-06-12 MED ORDER — LACTATED RINGERS IV SOLN: INTRAVENOUS | Status: DC | PRN

## 2023-06-12 MED ORDER — SODIUM CHLORIDE 0.9% FLUSH: 3.0000 mL | INTRAVENOUS | Status: DC | PRN

## 2023-06-12 MED ORDER — BUPROPION HCL ER (XL) 150 MG PO TB24
150.0000 mg | ORAL_TABLET | Freq: Every day | ORAL | Status: DC
  Administered 2023-06-12 – 2023-06-13 (×2): 150 mg via ORAL
  Filled 2023-06-12 (×2): qty 1

## 2023-06-12 MED ORDER — GABAPENTIN 300 MG PO CAPS
300.0000 mg | ORAL_CAPSULE | Freq: Two times a day (BID) | ORAL | Status: DC
  Administered 2023-06-12 – 2023-06-13 (×3): 300 mg via ORAL
  Filled 2023-06-12 (×3): qty 1

## 2023-06-12 MED ORDER — MIDAZOLAM HCL 2 MG/2ML IJ SOLN
INTRAMUSCULAR | Status: AC
  Filled 2023-06-12: qty 2

## 2023-06-12 MED ORDER — INSULIN ASPART 100 UNIT/ML IJ SOLN: 0.0000 [IU] | INTRAMUSCULAR | Status: DC | PRN

## 2023-06-12 MED ORDER — FENTANYL CITRATE (PF) 250 MCG/5ML IJ SOLN
INTRAMUSCULAR | Status: AC
  Filled 2023-06-12: qty 5

## 2023-06-12 MED ORDER — FENTANYL CITRATE (PF) 250 MCG/5ML IJ SOLN
INTRAMUSCULAR | Status: DC | PRN
  Administered 2023-06-12 (×3): 50 ug via INTRAVENOUS
  Administered 2023-06-12: 100 ug via INTRAVENOUS

## 2023-06-12 MED ORDER — EMPAGLIFLOZIN 10 MG PO TABS
10.0000 mg | ORAL_TABLET | Freq: Every day | ORAL | Status: DC
  Administered 2023-06-12 – 2023-06-13 (×2): 10 mg via ORAL
  Filled 2023-06-12 (×2): qty 1

## 2023-06-12 MED ORDER — CHLORHEXIDINE GLUCONATE CLOTH 2 % EX PADS: 6.0000 | MEDICATED_PAD | Freq: Once | CUTANEOUS | Status: DC

## 2023-06-12 MED ORDER — LORATADINE 10 MG PO TABS: 10.0000 mg | ORAL_TABLET | Freq: Every day | ORAL | Status: DC

## 2023-06-12 MED ORDER — CYCLOBENZAPRINE HCL 10 MG PO TABS
10.0000 mg | ORAL_TABLET | Freq: Three times a day (TID) | ORAL | Status: DC | PRN
  Administered 2023-06-12: 10 mg via ORAL
  Filled 2023-06-12: qty 1

## 2023-06-12 MED ORDER — SODIUM CHLORIDE 0.9% FLUSH
3.0000 mL | Freq: Two times a day (BID) | INTRAVENOUS | Status: DC
  Administered 2023-06-12 – 2023-06-13 (×2): 3 mL via INTRAVENOUS

## 2023-06-12 MED ORDER — PROPOFOL 10 MG/ML IV BOLUS
INTRAVENOUS | Status: DC | PRN
  Administered 2023-06-12: 30 mg via INTRAVENOUS
  Administered 2023-06-12: 170 mg via INTRAVENOUS

## 2023-06-12 MED ORDER — CHLORHEXIDINE GLUCONATE 0.12 % MT SOLN
15.0000 mL | Freq: Once | OROMUCOSAL | Status: AC
  Administered 2023-06-12: 15 mL via OROMUCOSAL
  Filled 2023-06-12: qty 15

## 2023-06-12 MED ORDER — BUPROPION HCL ER (XL) 300 MG PO TB24
300.0000 mg | ORAL_TABLET | Freq: Every day | ORAL | Status: DC
  Administered 2023-06-12 – 2023-06-13 (×2): 300 mg via ORAL
  Filled 2023-06-12 (×2): qty 1

## 2023-06-12 MED ORDER — 0.9 % SODIUM CHLORIDE (POUR BTL) OPTIME
TOPICAL | Status: DC | PRN
  Administered 2023-06-12: 1000 mL

## 2023-06-12 MED ORDER — INSULIN ASPART 100 UNIT/ML IJ SOLN: 0.0000 [IU] | Freq: Every day | INTRAMUSCULAR | Status: DC

## 2023-06-12 MED ORDER — HYDROMORPHONE HCL 1 MG/ML IJ SOLN
INTRAMUSCULAR | Status: AC
  Filled 2023-06-12: qty 1

## 2023-06-12 MED ORDER — CEFAZOLIN SODIUM-DEXTROSE 1-4 GM/50ML-% IV SOLN
1.0000 g | Freq: Three times a day (TID) | INTRAVENOUS | Status: AC
  Administered 2023-06-12 (×2): 1 g via INTRAVENOUS
  Filled 2023-06-12 (×2): qty 50

## 2023-06-12 MED ORDER — ONDANSETRON HCL 4 MG/2ML IJ SOLN
INTRAMUSCULAR | Status: DC | PRN
  Administered 2023-06-12: 4 mg via INTRAVENOUS

## 2023-06-12 MED ORDER — ACETAMINOPHEN 500 MG PO TABS
1000.0000 mg | ORAL_TABLET | Freq: Once | ORAL | Status: AC
  Administered 2023-06-12: 1000 mg via ORAL
  Filled 2023-06-12: qty 2

## 2023-06-12 MED ORDER — INSULIN ASPART 100 UNIT/ML IJ SOLN: 0.0000 [IU] | Freq: Three times a day (TID) | INTRAMUSCULAR | Status: DC

## 2023-06-12 MED ORDER — SUGAMMADEX SODIUM 200 MG/2ML IV SOLN
INTRAVENOUS | Status: DC | PRN
  Administered 2023-06-12: 200 mg via INTRAVENOUS

## 2023-06-12 MED ORDER — ADULT MULTIVITAMIN W/MINERALS CH
1.0000 | ORAL_TABLET | Freq: Every day | ORAL | Status: DC
  Administered 2023-06-12 – 2023-06-13 (×2): 1 via ORAL
  Filled 2023-06-12 (×2): qty 1

## 2023-06-12 MED ORDER — PHENOL 1.4 % MT LIQD: 1.0000 | OROMUCOSAL | Status: DC | PRN

## 2023-06-12 SURGICAL SUPPLY — 53 items
APL SKNCLS STERI-STRIP NONHPOA (GAUZE/BANDAGES/DRESSINGS) ×1
BAG COUNTER SPONGE SURGICOUNT (BAG) ×1 IMPLANT
BAG DECANTER FOR FLEXI CONT (MISCELLANEOUS) ×1 IMPLANT
BAG SPNG CNTER NS LX DISP (BAG) ×1
BENZOIN TINCTURE PRP APPL 2/3 (GAUZE/BANDAGES/DRESSINGS) ×1 IMPLANT
BIT DRILL 13 (BIT) IMPLANT
BUR MATCHSTICK NEURO 3.0 LAGG (BURR) ×1 IMPLANT
CAGE PEEK 6X14X11 (Cage) ×1 IMPLANT
CANISTER SUCT 3000ML PPV (MISCELLANEOUS) ×1 IMPLANT
DRAPE C-ARM 42X72 X-RAY (DRAPES) ×2 IMPLANT
DRAPE LAPAROTOMY 100X72 PEDS (DRAPES) ×1 IMPLANT
DRAPE MICROSCOPE SLANT 54X150 (MISCELLANEOUS) ×1 IMPLANT
DURAPREP 6ML APPLICATOR 50/CS (WOUND CARE) ×1 IMPLANT
ELECT COATED BLADE 2.86 ST (ELECTRODE) ×1 IMPLANT
ELECT REM PT RETURN 9FT ADLT (ELECTROSURGICAL) ×1
ELECTRODE REM PT RTRN 9FT ADLT (ELECTROSURGICAL) ×1 IMPLANT
GAUZE 4X4 16PLY ~~LOC~~+RFID DBL (SPONGE) IMPLANT
GAUZE SPONGE 4X4 12PLY STRL (GAUZE/BANDAGES/DRESSINGS) ×1 IMPLANT
GLOVE ECLIPSE 9.0 STRL (GLOVE) ×1 IMPLANT
GLOVE EXAM NITRILE XL STR (GLOVE) IMPLANT
GOWN STRL REUS W/ TWL LRG LVL3 (GOWN DISPOSABLE) IMPLANT
GOWN STRL REUS W/ TWL XL LVL3 (GOWN DISPOSABLE) IMPLANT
GOWN STRL REUS W/TWL 2XL LVL3 (GOWN DISPOSABLE) IMPLANT
GOWN STRL REUS W/TWL LRG LVL3 (GOWN DISPOSABLE)
GOWN STRL REUS W/TWL XL LVL3 (GOWN DISPOSABLE)
HALTER HD/CHIN CERV TRACTION D (MISCELLANEOUS) ×1 IMPLANT
HEMOSTAT POWDER KIT SURGIFOAM (HEMOSTASIS) IMPLANT
KIT BASIN OR (CUSTOM PROCEDURE TRAY) ×1 IMPLANT
KIT TURNOVER KIT B (KITS) ×1 IMPLANT
NDL SPNL 20GX3.5 QUINCKE YW (NEEDLE) ×1 IMPLANT
NEEDLE SPNL 20GX3.5 QUINCKE YW (NEEDLE) ×1 IMPLANT
NS IRRIG 1000ML POUR BTL (IV SOLUTION) ×1 IMPLANT
PACK LAMINECTOMY NEURO (CUSTOM PROCEDURE TRAY) ×1 IMPLANT
PAD ARMBOARD 7.5X6 YLW CONV (MISCELLANEOUS) ×3 IMPLANT
PLATE VISION ELITE 27.5MM (Plate) IMPLANT
RASP 3.0MM (RASP) IMPLANT
SCREW ST 13X4XST VA NS SPNE (Screw) IMPLANT
SCREW ST VAR 4 ATL (Screw) ×4 IMPLANT
SOL ELECTROSURG ANTI STICK (MISCELLANEOUS)
SOLUTION ELECTROSURG ANTI STCK (MISCELLANEOUS) ×1 IMPLANT
SPACER SPNL 11X14X6XPEEK CVD (Cage) IMPLANT
SPCR SPNL 11X14X6XPEEK CVD (Cage) ×1 IMPLANT
SPONGE INTESTINAL PEANUT (DISPOSABLE) ×1 IMPLANT
SPONGE SURGIFOAM ABS GEL SZ50 (HEMOSTASIS) ×1 IMPLANT
STRIP CLOSURE SKIN 1/2X4 (GAUZE/BANDAGES/DRESSINGS) ×1 IMPLANT
SUT VIC AB 3-0 SH 8-18 (SUTURE) ×1 IMPLANT
SUT VIC AB 4-0 RB1 18 (SUTURE) ×1 IMPLANT
TAPE CLOTH 4X10 WHT NS (GAUZE/BANDAGES/DRESSINGS) ×1 IMPLANT
TAPE CLOTH SURG 4X10 WHT LF (GAUZE/BANDAGES/DRESSINGS) IMPLANT
TOWEL GREEN STERILE (TOWEL DISPOSABLE) ×1 IMPLANT
TOWEL GREEN STERILE FF (TOWEL DISPOSABLE) ×1 IMPLANT
TRAP SPECIMEN MUCUS 40CC (MISCELLANEOUS) ×1 IMPLANT
WATER STERILE IRR 1000ML POUR (IV SOLUTION) ×1 IMPLANT

## 2023-06-12 NOTE — Progress Notes (Signed)
Orthopedic Tech Progress Note Patient Details:  Jenny [name] [facility] [birth year] [id number]  Ortho Devices Type of Ortho Device: Soft collar Ortho Device/Splint Location: Neck Ortho Device/Splint Interventions: Ordered, Application   Post Interventions Patient Tolerated: Well  Jenny [name] [name] 06/12/2023, 10:31 AM

## 2023-06-12 NOTE — H&P (Signed)
Jenny TRUSZKOWSKI is an 62 y.o. female.   Chief Complaint: Neck pain HPI: 62 year old female status post prior C4-C6  anterior cervical discectomy and fusion presents now with worsening neck pain and bilateral upper extremity numbness paresthesias and some early weakness.  Workup demonstrates evidence of adjacent level spondylosis with broad-based disc bulging and stenosis with early cord compression and signal change at C3-4.  Patient presents now for C3-4 anterior cervical dissecting fusion in hopes improving her symptoms.  Past Medical History:  Diagnosis Date   Anemia    Anxiety    Arthritis    Cancer (HCC)    colon cancer    Depression    Diabetes mellitus, type 2 (HCC)    Enlarged thyroid    Fatty liver     Past Surgical History:  Procedure Laterality Date   BOWEL RESECTION     CERVICAL SPINE SURGERY  06/17/2012   C5-C7 ACDF   CESAREAN SECTION     PARTIAL HYSTERECTOMY     PORT-A-CATH REMOVAL Left 07/28/2015   Procedure: REMOVAL PORT-A-CATH;  Surgeon: Romie Levee, MD;  Location: [facility];  Service: General;  Laterality: Left;   PORTACATH PLACEMENT Left 12/22/2014   Procedure: INSERTION PORT-A-CATH LEFT SUBCLAVIAN;  Surgeon: Romie Levee, MD;  Location: [facility];  Service: General;  Laterality: Left;   TONSILLECTOMY      Family History  Problem Relation Age of Onset   Cancer Brother    Depression Brother    Cancer Brother    Hypertension Mother    Colon cancer Neg Hx    Social History:  reports that she has never smoked. She has never been exposed to tobacco smoke. She has never used smokeless tobacco. She reports that she does not drink alcohol and does not use drugs.  Allergies:  Allergies  Allergen Reactions   Other Rash    *Derma Bond*   Attends Briefs Small Other (See Comments)   Shellfish Allergy Rash    Medications Prior to Admission  Medication Sig Dispense Refill   acetaminophen (TYLENOL) 650 MG CR tablet Take 1,300 mg by mouth every 8 (eight) hours as  needed for pain.     [START ON 06/24/2023] ARIPiprazole (ABILIFY) 10 MG tablet Take 1 tablet (10 mg total) by mouth daily. 90 tablet 0   atorvastatin (LIPITOR) 20 MG tablet TAKE 1 TABLET BY MOUTH EVERY DAY 90 tablet 3   buPROPion (WELLBUTRIN XL) 150 MG 24 hr tablet Take 1 tablet (150 mg total) by mouth daily. Take total of 450 mg daily (300 mg + 150 mg) 90 tablet 1   buPROPion (WELLBUTRIN XL) 300 MG 24 hr tablet Take 1 tablet (300 mg total) by mouth daily. Take total of 450 mg daily, along with 150 mg daily 90 tablet 1   clindamycin (CLEOCIN) 150 MG capsule Take 150 mg by mouth every 6 (six) hours.     diclofenac Sodium (ALEVE ARTHRITIS PAIN) 1 % GEL Apply 2 g topically 2 (two) times daily as needed (pain).     diclofenac Sodium (VOLTAREN) 1 % GEL Apply 2 g topically 4 (four) times daily. 150 g 4   gabapentin (NEURONTIN) 300 MG capsule Take 1 capsule (300 mg total) by mouth 3 (three) times daily. Take 2-3 capsules before bed (Patient taking differently: Take 300 mg by mouth 2 (two) times daily. Take 2-3 capsules before bed) 270 capsule 2   JARDIANCE 10 MG TABS tablet TAKE 1 TABLET BY MOUTH EVERY DAY (Patient taking differently: Take 10 mg  by mouth daily.) 90 tablet 2   metFORMIN (GLUCOPHAGE-XR) 500 MG 24 hr tablet TAKE 2 TABLETS BY MOUTH TWICE A DAY (Patient taking differently: Take 500 mg by mouth 2 (two) times daily with a meal.) 360 tablet 2   Multiple Vitamin (MULTIVITAMIN) tablet Take 1 tablet by mouth daily.     Semaglutide, 2 MG/DOSE, 8 MG/3ML SOPN Inject 2 mg into the skin once a week. 9 mL 6   traZODone (DESYREL) 50 MG tablet Take 1 tablet (50 mg total) by mouth at bedtime as needed for sleep. 90 tablet 1   [START ON 07/14/2023] venlafaxine XR (EFFEXOR-XR) 150 MG 24 hr capsule Take 1 capsule (150 mg total) by mouth daily. Total of 187.5 mg daily. Take along with 37.5 mg cap 90 capsule 1   venlafaxine XR (EFFEXOR-XR) 37.5 MG 24 hr capsule Take 1 capsule (37.5 mg total) by mouth daily. TAKE 1  CAPSULE BY MOUTH DAILY ALONG WITH THE 150 MG FOR TOTAL DAILY DOSE OF 187.5 MG 90 capsule 1   cyclobenzaprine (FLEXERIL) 5 MG tablet TAKE 1 TABLET BY MOUTH 2 TIMES DAILY AS NEEDED FOR MUSCLE SPASMS. (Patient taking differently: Take 5 mg by mouth 2 (two) times daily as needed for muscle spasms.) 30 tablet 0   fluticasone (FLONASE) 50 MCG/ACT nasal spray Place 2 sprays into both nostrils daily. (Patient taking differently: Place 2 sprays into both nostrils daily as needed for allergies.) 16 g 2   HYDROcodone-acetaminophen (NORCO/VICODIN) 5-325 MG tablet Take 1 tablet by mouth every 4 (four) hours as needed for moderate pain or severe pain. (Patient not taking: Reported on 06/06/2023)     hydrocortisone cream 0.5 % Apply 1 application  topically 2 (two) times daily as needed for itching.     ibuprofen (ADVIL) 600 MG tablet Take 1 tablet (600 mg total) by mouth every 6 (six) hours as needed. 30 tablet 0   loratadine (CLARITIN) 10 MG tablet Take 1 tablet (10 mg total) by mouth daily. AS NEEDED 90 tablet 1   methocarbamol (ROBAXIN) 500 MG tablet Take 1 tablet (500 mg total) by mouth 4 (four) times daily. (Patient not taking: Reported on 06/06/2023) 20 tablet 0   Zoster Vaccine Adjuvanted [facility]) injection Inject 0.5 ml IM and Repeat in 2 months (Patient not taking: Reported on 02/26/2023) 0.5 mL 1    Results for orders placed or performed during the hospital encounter of 06/12/23 (from the past 48 hour(s))  Glucose, capillary     Status: None   Collection Time: 06/12/23  6:14 AM  Result Value Ref Range   Glucose-Capillary 79 70 - 99 mg/dL    Comment: Glucose reference range applies only to samples taken after fasting for at least 8 hours.   No results found.  Pertinent items noted in HPI and remainder of comprehensive ROS otherwise negative.  Blood pressure (!) 150/93, pulse 67, temperature 98.1 F (36.7 C), temperature source Oral, resp. rate 17, height 5\' 5"  (1.651 m), weight 105.7 kg, SpO2 96  %.  Patient is awake and alert.  She is oriented and appropriate.  Speech is fluent.  Judgment insight are intact.  Cranial nerve function normal bilateral.  Motor examination reveals mild weakness of grip strength and intrinsics bilateral.  Sensory examination with patchy distal sensory loss in both upper extremities.  Reflexes are brisk but symmetric.  No with long track signs.  Gait normal.  Posture normal peer examination head ears eyes nose throat is unremarkable her chest and abdomen are benign.  Extremities are free from injury deformity. Assessment/Plan C3-4 stenosis with myelopathy.  Plan C3-4 anterior cervical segment fusion with interbody cage, local harvested autograft, and anterior placed patient.  This will require removal of her prior C4-C6 anterior instrumentation.  Risks and benefits been explained.  Patient wishes proceed.  Kathaleen Maser Myrtle Haller 06/12/2023, 7:39 AM

## 2023-06-12 NOTE — Brief Op Note (Signed)
06/12/2023  9:48 AM  PATIENT:  Jenny [name]  62 y.o. female  PRE-OPERATIVE DIAGNOSIS:  Stenosis  POST-OPERATIVE DIAGNOSIS:  Stenosis  PROCEDURE:  Procedure(s) with comments: ANTERIOR CERVICAL DISCECTOMY AND FUSION, CERVICAL THREE-CERVICAL FOUR (N/A) - 3C  SURGEON:  Surgeon(s) and Role:    * Julio Sicks, MD - Primary  PHYSICIAN ASSISTANT:   ASSISTANTS: bergman,NP   ANESTHESIA:   general  EBL:  100 mL   BLOOD ADMINISTERED:none  DRAINS: none   LOCAL MEDICATIONS USED:  NONE  SPECIMEN:  No Specimen  DISPOSITION OF SPECIMEN:  N/A  COUNTS:  YES  TOURNIQUET:  * No tourniquets in log *  DICTATION: .Dragon Dictation  PLAN OF CARE: Admit for overnight observation  PATIENT DISPOSITION:  PACU - hemodynamically stable.   Delay start of Pharmacological VTE agent (>24hrs) due to surgical blood loss or risk of bleeding: yes

## 2023-06-12 NOTE — Anesthesia Postprocedure Evaluation (Signed)
Anesthesia Post Note  Patient: Jenny [name]  Procedure(s) Performed: ANTERIOR CERVICAL DISCECTOMY AND FUSION, CERVICAL THREE-CERVICAL FOUR (Spine Cervical)     Patient location during evaluation: PACU Anesthesia Type: General Level of consciousness: awake and alert Pain management: pain level controlled Vital Signs Assessment: post-procedure vital signs reviewed and stable Respiratory status: spontaneous breathing, nonlabored ventilation and respiratory function stable Cardiovascular status: blood pressure returned to baseline and stable Postop Assessment: no apparent nausea or vomiting Anesthetic complications: yes  Encounter Notable Events  Notable Event Outcome Phase Comment  Difficult to intubate - expected  Intraprocedure Filed from anesthesia note documentation.    Last Vitals:  Vitals:   06/12/23 1115 06/12/23 1143  BP: (!) 132/90 110/61  Pulse: 80 74  Resp: 12   Temp: 36.4 C 36.5 C  SpO2: 94% 91%    Last Pain:  Vitals:   06/12/23 1143  TempSrc: Oral  PainSc:                  [name],W. EDMOND

## 2023-06-12 NOTE — Anesthesia Procedure Notes (Addendum)
Procedure Name: Intubation Date/Time: 06/12/2023 8:04 AM  Performed by: Waynard Edwards, CRNAPre-anesthesia Checklist: Patient identified, Emergency Drugs available, Suction available and Patient being monitored Patient Re-evaluated:Patient Re-evaluated prior to induction Oxygen Delivery Method: Circle System Utilized Preoxygenation: Pre-oxygenation with 100% oxygen Induction Type: IV induction Ventilation: Two handed mask ventilation required Laryngoscope Size: Glidescope and 3 Grade View: Grade I Tube type: Oral Tube size: 7.0 mm Number of attempts: 1 Airway Equipment and Method: Rigid stylet and Video-laryngoscopy Placement Confirmation: ETT inserted through vocal cords under direct vision, positive ETCO2 and breath sounds checked- equal and bilateral Secured at: 22 cm Tube secured with: Tape Dental Injury: Teeth and Oropharynx as per pre-operative assessment  Difficulty Due To: Difficult Airway- due to reduced neck mobility and Difficulty was anticipated Comments: Intubation preformed by SRNA with supervision from MDA and CRNA. Earleen Reaper, SRNA

## 2023-06-12 NOTE — Op Note (Signed)
Date of procedure: 06/12/2023  Date of dictation: Same  Service: Neurosurgery  Preoperative diagnosis: C3-4 stenosis with myelopathy  Postoperative diagnosis: Same  Procedure Name: C3-4 anterior cervical discectomy with interbody fusion utilizing her body cage, low curves that autograft, and anterior plate instrumentation.  Removal of C4 instrumentation.  Surgeon:Virda Betters A.Travoris Bushey, M.D.  Asst. Surgeon: Cyndie Chime, NP  Anesthesia: General  Indication: 62 year old female with neck and bilateral upper extremity numbness paresthesias and weakness.  Workup demonstrates evidence of prior anterior cervical fusion at C4-5 and C5-6.  Patient has adjacent level degeneration with associated spondylosis and severe stenosis with cord compression and early cord signal change.  Patient presents now for C3-4 anterior cervical discectomy and fusion.  Operative note: After induction of anesthesia, patient positioned supine with neck slightly extended and held placeholder traction.  Patient's anterior cervical region prepped and draped sterilely.  Incision made on the right side through her previous incision.  This carried down sharply to the platysma.  This was then divided vertically.  Dissection proceeded along the medial aspect of the sternocleidomastoid muscle.  The omohyoid was identified.  At this point a very large thyroid nodule on the right side came into the field.  This was extremely large and firm and difficult to mobilize away.  Dissection proceeded along the medial aspect of the carotid sheath.  Trachea and esophagus were mobilized and retracted towards the left.  Prevertebral fascia stripped of the anterior spinal column.  Previously placed anterior cervical instrumentation at C4-5 was identified and dissected free.  Disc space at C3-4 was incised.  Discectomy then performed using various instruments down from the posterior annulus.  Microscope brought into the field used throughout the remainder of the  discectomy.  Remaining aspects of annulus and osteophytes removed using high-speed drill down 12 the posterior longitudinal ligament.  Posterior logical was then elevated and resected.  Underlying thecal sac was identified.  A wide central decompression was then performed undercutting the bodies of C3 and C4.  Decompression then proceeded into each neural foramina.  Wide anterior foraminotomies performed on course exiting C4 nerve roots bilaterally.  This point at the very thorough discectomy and decompression been achieved.  There is no evidence of injury to the thecal sac and nerve roots.  The wound was then irrigated.  Gelfoam was placed topically for hemostasis.  6 mm [employer]onic anatomic peek cage was packed with locally harvested autograft and impacted in the place where it was recessed slightly from the anterior cortical margin.  Given the exposure difficulties presented by the thyroid nodule I decided to cut the superior aspect of the plate beneath the C4 screws.  Using a metal cutting bit and protecting the soft tissues around it and the plate was divided.  The screws into C4 were removed and the superior aspect the plate was removed.  Wound was then irrigated.  27 mm Atlantis anterior cervical use and placed over the C3 and C4 level.  This then attached under full fluoroscopic guidance using 13 mm of variable angle screws to each at both levels.  Final images revealed good position of the bone graft and the hardware at the proper operative level with normal alignment spine.  Wound is then irrigated.  Hemostasis was again assured.  Wound was then closed in layers with Vicryl sutures.  Steri-Strips and sterile dressing were applied.  No apparent complications.  Patient tolerated the procedure well and she returns to recovery room postop.

## 2023-06-12 NOTE — Transfer of Care (Signed)
Immediate Anesthesia Transfer of Care Note  Patient: Jenny [name]  Procedure(s) Performed: ANTERIOR CERVICAL DISCECTOMY AND FUSION, CERVICAL THREE-CERVICAL FOUR (Spine Cervical)  Patient Location: PACU  Anesthesia Type:General  Level of Consciousness: drowsy  Airway & Oxygen Therapy: Patient Spontanous Breathing and Patient connected to face mask oxygen  Post-op Assessment: Report given to RN and Post -op Vital signs reviewed and stable  Post vital signs: Reviewed and stable  Last Vitals:  Vitals Value Taken Time  BP 146/90 06/12/23 1003  Temp    Pulse 72 06/12/23 1004  Resp 16 06/12/23 1004  SpO2 99 % 06/12/23 1004  Vitals shown include unvalidated device data.  Last Pain:  Vitals:   06/12/23 0625  TempSrc:   PainSc: 8       Patients Stated Pain Goal: 2 (06/12/23 1610)  Complications:  Encounter Notable Events  Notable Event Outcome Phase Comment  Difficult to intubate - expected  Intraprocedure Filed from anesthesia note documentation.

## 2023-06-12 NOTE — Evaluation (Signed)
Occupational Therapy Evaluation Patient Details Name: Jenny [name] MRN: [id number] DOB: [birth year] Today's Date: 06/12/2023   History of Present Illness PT is a 62yo female admitted for C3-C4 ACDF.  PMH: C4-C6 ACDF, CA, DM, depression.   Clinical Impression   Pt admitted with the above diagnosis and has the deficits listed below. Pt very lethargic during session and unable to keep eyes open most of time. Pt would benefit from one more OT session to review precautions and address toileting and tub transfers prior to pt going home.  Pt was independent PTA and has husband and children to assist at d/c. Feel once she is not so lethargic, pt will be able to function with greater independence.  Will see one more visit before d/c tomorrow am.       Recommendations for follow up therapy are one component of a multi-disciplinary discharge planning process, led by the attending physician.  Recommendations may be updated based on patient status, additional functional criteria and insurance authorization.   Assistance Recommended at Discharge Intermittent Supervision/Assistance  Patient can return home with the following A little help with walking and/or transfers;A little help with bathing/dressing/bathroom;Assistance with cooking/housework;Assist for transportation;Help with stairs or ramp for entrance    Functional Status Assessment  Patient has had a recent decline in their functional status and demonstrates the ability to make significant improvements in function in a reasonable and predictable amount of time.  Equipment Recommendations  Other (comment) (family may look into a elevated toilet seat)    Recommendations for Other Services       Precautions / Restrictions Precautions Precautions: Fall;Cervical Required Braces or Orthoses: Cervical Brace Cervical Brace: Soft collar;For comfort Restrictions Weight Bearing Restrictions: No      Mobility Bed Mobility Overal bed mobility:  Needs Assistance Bed Mobility: Supine to Sit, Sit to Supine     Supine to sit: Supervision, HOB elevated (has elevated bed at home) Sit to supine: Supervision, HOB elevated   General bed mobility comments: Pt with HOB elevated. Pt has elevated bed at home.    Transfers Overall transfer level: Needs assistance Equipment used: 1 person hand held assist Transfers: Sit to/from Stand, Bed to chair/wheelchair/BSC Sit to Stand: Min guard     Step pivot transfers: Min guard     General transfer comment: Pt limited by eyes closed and lethargy      Balance Overall balance assessment: Needs assistance Sitting-balance support: Feet supported Sitting balance-Leahy Scale: Fair     Standing balance support: Bilateral upper extremity supported, During functional activity Standing balance-Leahy Scale: Fair Standing balance comment: Pt must have outside support to stand with eyes closed and lethargy.                           ADL either performed or assessed with clinical judgement   ADL Overall ADL's : Needs assistance/impaired Eating/Feeding: Set up;Sitting   Grooming: Set up;Sitting   Upper Body Bathing: Set up;Sitting   Lower Body Bathing: Moderate assistance;Sit to/from stand;Cueing for compensatory techniques   Upper Body Dressing : Minimal assistance;Sitting   Lower Body Dressing: Moderate assistance;Sit to/from stand;Cueing for compensatory techniques   Toilet Transfer: Minimal assistance;Ambulation;Comfort height toilet;Grab bars Toilet Transfer Details (indicate cue type and reason): walked to bathroom holding onto therapist. Could not walk without assist due to grogginess. Toileting- Clothing Manipulation and Hygiene: Supervision/safety;Sitting/lateral lean       Functional mobility during ADLs: Minimal assistance General ADL Comments: Pt limited by  grogginess.  Pt could not keep eyes open. Feel when pt more awake, she will mobilize better.  All education  completed with daughter in room. Pt may not recall all education but daughter stated she understood.  Spoke to daugher about getting a toilet riser if pt struggles with toileting at home but feel she should be ok when more awake.     Vision Baseline Vision/History: 0 No visual deficits Ability to See in Adequate Light: 0 Adequate Patient Visual Report: No change from baseline Vision Assessment?: No apparent visual deficits     Perception     Praxis Praxis Praxis tested?: Not tested    Pertinent Vitals/Pain Pain Assessment Pain Assessment: Faces Faces Pain Scale: Hurts whole lot Pain Location: neck Pain Descriptors / Indicators: Aching, Grimacing, Sore Pain Intervention(s): Limited activity within patient's tolerance, Monitored during session, Premedicated before session, Repositioned     Hand Dominance Right   Extremity/Trunk Assessment Upper Extremity Assessment Upper Extremity Assessment: Overall WFL for tasks assessed   Lower Extremity Assessment Lower Extremity Assessment: Overall WFL for tasks assessed   Cervical / Trunk Assessment Cervical / Trunk Assessment: Neck Surgery   Communication Communication Communication: No difficulties   Cognition Arousal/Alertness: Lethargic Behavior During Therapy: Flat affect Overall Cognitive Status: Within Functional Limits for tasks assessed                                 General Comments: Pt very groggy but A&Ox4 following all commands; just slow to process. Eyes closed much of session     General Comments  Pt has had cervical surgery in past and is familiar with precautions.  Feel pt will mobilze better when more awake.    Exercises     Shoulder Instructions      Home Living Family/patient expects to be discharged to:: Private residence Living Arrangements: Spouse/significant other;Children Available Help at Discharge: Family;Friend(s);Available 24 hours/day Type of Home: House Home Access: Stairs to  enter [employer] of Steps: 1   Home Layout: Two level Alternate Level Stairs-Number of Steps: 14 Alternate Level Stairs-Rails: Left Bathroom Shower/Tub: Chief Strategy Officer: Standard     Home Equipment: None          Prior Functioning/Environment Prior Level of Function : Independent/Modified Independent             Mobility Comments: No assist needed ADLs Comments: no assist needed PTA        OT Problem List: Decreased activity tolerance;Impaired balance (sitting and/or standing);Pain      OT Treatment/Interventions: Self-care/ADL training;Therapeutic activities;Balance training;DME and/or AE instruction    OT Goals(Current goals can be found in the care plan section) Acute Rehab OT Goals Patient Stated Goal: to go h ome tomorrow OT Goal Formulation: With patient Time For Goal Achievement: 06/26/23 Potential to Achieve Goals: Good ADL Goals Pt Will Perform Tub/Shower Transfer: Tub transfer;with min assist;ambulating Additional ADL Goal #1: Pt will walk to bathroom and toilet raised commode with supervision. Additional ADL Goal #2: Pt will dress self with min assist and daughter independently assisting with what pt cannot do.  OT Frequency: Min 2X/week    Co-evaluation              AM-PAC OT "6 Clicks" Daily Activity     Outcome Measure Help from another person eating meals?: None Help from another person taking care of personal grooming?: None Help from another person toileting, which  includes using toliet, bedpan, or urinal?: A Little Help from another person bathing (including washing, rinsing, drying)?: A Little Help from another person to put on and taking off regular upper body clothing?: A Little Help from another person to put on and taking off regular lower body clothing?: A Lot 6 Click Score: 19   End of Session Equipment Utilized During Treatment: Cervical collar Nurse Communication: Mobility status  Activity  Tolerance: Patient limited by lethargy Patient left: in bed;with call bell/phone within reach;with family/visitor present  OT Visit Diagnosis: Unsteadiness on feet (R26.81)                Time: 1610-9604 OT Time Calculation (min): 30 min Charges:  OT General Charges $OT Visit: 1 Visit OT Evaluation $OT Eval Low Complexity: 1 Low OT Treatments $Self Care/Home Management : 8-22 mins  Hope Budds 06/12/2023, 3:18 PM

## 2023-06-13 ENCOUNTER — Encounter (HOSPITAL_COMMUNITY): Payer: Self-pay | Admitting: Neurosurgery

## 2023-06-13 LAB — GLUCOSE, CAPILLARY: Glucose-Capillary: 130 mg/dL — ABNORMAL HIGH (ref 70–99)

## 2023-06-13 MED ORDER — HYDROCODONE-ACETAMINOPHEN 10-325 MG PO TABS: 1.0000 | ORAL_TABLET | ORAL | 0 refills | Status: DC | PRN

## 2023-06-13 MED ORDER — CYCLOBENZAPRINE HCL 10 MG PO TABS: 10.0000 mg | ORAL_TABLET | Freq: Three times a day (TID) | ORAL | 0 refills | Status: DC | PRN

## 2023-06-13 MED FILL — Thrombin For Soln 5000 Unit: CUTANEOUS | Qty: 2 | Status: AC

## 2023-06-13 NOTE — Discharge Instructions (Signed)
Wound Care Leave steri-strips on neck.  They will fall off by themselves. Do not put any creams, lotions, or ointments on incision. You are fine to shower. Let water run over incision and pat dry.  Activity Walk each and every day, increasing distance each day. No lifting greater than 5 lbs.  Avoid excessive neck motion. No driving for 2 weeks; may ride as a passenger locally.  Diet Resume your normal diet.   Return to Work Will be discussed at your follow up appointment.  Call Your Doctor If Any of These Occur Redness, drainage, or swelling at the wound.  Temperature greater than 101 degrees. Severe pain not relieved by pain medication. Incision starts to come apart.  Follow Up Appt Call [phone] today for appointment in 2 weeks if you don't already have one or for any problems.

## 2023-06-13 NOTE — Discharge Summary (Signed)
Physician Discharge Summary     Providing Compassionate, Quality Care - Together   Patient ID: Jenny [name] MRN: [id number] DOB/AGE: [birth year] 62 y.o.  Admit date: 06/12/2023 Discharge date: 06/13/2023  Admission Diagnoses: Cervical spondylosis with myelopathy and radiculopathy  Discharge Diagnoses:  Principal Problem:   Cervical spondylosis with myelopathy and radiculopathy   Discharged Condition: good  Hospital Course: Patient underwent a C3-4 ACDF by Dr. Jordan Likes on 06/12/2023. She was admitted to 3C08 following recovery from anesthesia in the PACU. Her postoperative course has been uncomplicated. She has worked with occupational therapy who feels the patient is ready for discharge home. She is ambulating independently and without difficulty. She has some dysphagia due to her large thyroid nodule. She is not having any bowel or bladder dysfunction. Her pain is well-controlled with oral pain medication. She is ready for discharge home.   Consults: OT  Significant Diagnostic Studies: radiology: DG Cervical Spine 1 View  Result Date: 06/12/2023 CLINICAL DATA:  ACDF EXAM: DG CERVICAL SPINE - 1 VIEW COMPARISON:  05/14/2023 FINDINGS: One fluoroscopic images are obtained during the performance of the procedure and are provided for interpretation only. Images demonstrate interval ACDF at C3-C4. Visualization of the more distal likely C5-C6 ACDF is limited by overlapping soft tissues. Fluoroscopy time: 1 second Cumulative air kerma: 0.10 mGy IMPRESSION: Intraoperative fluoroscopic images during C3-C4 ACDF. Electronically Signed   By: Wiliam Ke M.D.   On: 06/12/2023 12:39   DG C-Arm 1-60 Min-No Report  Result Date: 06/12/2023 Fluoroscopy was utilized by the requesting physician.  No radiographic interpretation.     Treatments: surgery: C3-4 anterior cervical discectomy with interbody fusion utilizing her body cage, low curves that autograft, and anterior plate  instrumentation.   Discharge Exam: Blood pressure 105/74, pulse 75, temperature 98.2 F (36.8 C), temperature source Oral, resp. rate 20, height 5\' 5"  (1.651 m), weight 105.7 kg, SpO2 93 %.  Alert and oriented x 4 PERRLA CN II-XII grossly intact Speech clear Trachea midline MAE, Strength and sensation intact Incision is covered with Steri Strips; Site is clean, dry, and intact   Disposition: Discharge disposition: 01-Home or Self Care       Discharge Instructions     Call MD for:  difficulty breathing, headache or visual disturbances   Complete by: As directed    Call MD for:  hives   Complete by: As directed    Call MD for:  persistant nausea and vomiting   Complete by: As directed    Call MD for:  redness, tenderness, or signs of infection (pain, swelling, redness, odor or green/yellow discharge around incision site)   Complete by: As directed    Call MD for:  severe uncontrolled pain   Complete by: As directed    Call MD for:  temperature >100.4   Complete by: As directed    Diet - low sodium heart healthy   Complete by: As directed    If the dressing is still on your incision site when you go home, remove it on the third day after your surgery date. Remove dressing if it begins to fall off, or if it is dirty or damaged before the third day.   Complete by: As directed    Increase activity slowly   Complete by: As directed       Allergies as of 06/13/2023       Reactions   Other Rash   *Derma Bond*   Attends Briefs Small Other (See Comments)   Shellfish  Allergy Rash        Medication List     STOP taking these medications    HYDROcodone-acetaminophen 5-325 MG tablet Commonly known as: NORCO/VICODIN Replaced by: HYDROcodone-acetaminophen 10-325 MG tablet   ibuprofen 600 MG tablet Commonly known as: ADVIL   Zoster Vaccine Adjuvanted injection Commonly known as: SHINGRIX       TAKE these medications    acetaminophen 650 MG CR tablet Commonly  known as: TYLENOL Take 1,300 mg by mouth every 8 (eight) hours as needed for pain.   ARIPiprazole 10 MG tablet Commonly known as: ABILIFY Take 1 tablet (10 mg total) by mouth daily. Start taking on: June 24, 2023   atorvastatin 20 MG tablet Commonly known as: LIPITOR TAKE 1 TABLET BY MOUTH EVERY DAY   buPROPion 150 MG 24 hr tablet Commonly known as: WELLBUTRIN XL Take 1 tablet (150 mg total) by mouth daily. Take total of 450 mg daily (300 mg + 150 mg)   buPROPion 300 MG 24 hr tablet Commonly known as: WELLBUTRIN XL Take 1 tablet (300 mg total) by mouth daily. Take total of 450 mg daily, along with 150 mg daily   clindamycin 150 MG capsule Commonly known as: CLEOCIN Take 150 mg by mouth every 6 (six) hours.   cyclobenzaprine 10 MG tablet Commonly known as: FLEXERIL Take 1 tablet (10 mg total) by mouth 3 (three) times daily as needed for muscle spasms. What changed:  medication strength See the new instructions.   diclofenac Sodium 1 % Gel Commonly known as: VOLTAREN Apply 2 g topically 4 (four) times daily. What changed: Another medication with the same name was removed. Continue taking this medication, and follow the directions you see here.   fluticasone 50 MCG/ACT nasal spray Commonly known as: FLONASE Place 2 sprays into both nostrils daily. What changed:  when to take this reasons to take this   gabapentin 300 MG capsule Commonly known as: NEURONTIN Take 1 capsule (300 mg total) by mouth 3 (three) times daily. Take 2-3 capsules before bed What changed: when to take this   HYDROcodone-acetaminophen 10-325 MG tablet Commonly known as: NORCO Take 1 tablet by mouth every 4 (four) hours as needed for severe pain ((score 7 to 10)). Replaces: HYDROcodone-acetaminophen 5-325 MG tablet   hydrocortisone cream 0.5 % Apply 1 application  topically 2 (two) times daily as needed for itching.   Jardiance 10 MG Tabs tablet Generic drug: empagliflozin TAKE 1 TABLET BY  MOUTH EVERY DAY What changed: how much to take   loratadine 10 MG tablet Commonly known as: CLARITIN Take 1 tablet (10 mg total) by mouth daily. AS NEEDED   metFORMIN 500 MG 24 hr tablet Commonly known as: GLUCOPHAGE-XR TAKE 2 TABLETS BY MOUTH TWICE A DAY What changed:  how much to take when to take this   methocarbamol 500 MG tablet Commonly known as: Robaxin Take 1 tablet (500 mg total) by mouth 4 (four) times daily.   multivitamin tablet Take 1 tablet by mouth daily.   Semaglutide (2 MG/DOSE) 8 MG/3ML Sopn Inject 2 mg into the skin once a week.   traZODone 50 MG tablet Commonly known as: DESYREL Take 1 tablet (50 mg total) by mouth at bedtime as needed for sleep.   venlafaxine XR 37.5 MG 24 hr capsule Commonly known as: EFFEXOR-XR Take 1 capsule (37.5 mg total) by mouth daily. TAKE 1 CAPSULE BY MOUTH DAILY ALONG WITH THE 150 MG FOR TOTAL DAILY DOSE OF 187.5 MG   venlafaxine  XR 150 MG 24 hr capsule Commonly known as: EFFEXOR-XR Take 1 capsule (150 mg total) by mouth daily. Total of 187.5 mg daily. Take along with 37.5 mg cap Start taking on: July 14, 2023               Durable Medical Equipment  (From admission, onward)           Start     Ordered   06/13/23 0931  For home use only DME 3 n 1  Once        06/13/23 0930              Discharge Care Instructions  (From admission, onward)           Start     Ordered   06/13/23 0000  If the dressing is still on your incision site when you go home, remove it on the third day after your surgery date. Remove dressing if it begins to fall off, or if it is dirty or damaged before the third day.        06/13/23 1018            Follow-up Information     Julio Sicks, MD Follow up on 07/02/2023.   Specialty: Neurosurgery Why: First post op appointment is on 07/02/2023 at 3:30 PM. Contact information: [address] [place] [address] [phone]                  Signed: Val Eagle, DNP, AGNP-C Nurse Practitioner  [facility] [address], [place], [place] [address] P: [phone]    F: [phone]  06/13/2023, 10:18 AM

## 2023-06-13 NOTE — TOC Transition Note (Signed)
Transition of Care [facility]) - CM/SW Discharge Note   Patient Details  Name: Jenny [name] MRN: [id number] Date of Birth: 1961-12-11  [facility]) CM/SW Contact:  Leone Haven, RN Phone Number: 06/13/2023, 12:43 PM   Clinical Narrative:    Patient was dc to home, had no needs.         Patient Goals and CMS Choice      Discharge Placement                         Discharge Plan and Services Additional resources added to the After Visit Summary for                                       Social Determinants of Health (SDOH) Interventions SDOH Screenings   Food Insecurity: No Food Insecurity (03/18/2019)  Housing: Low Risk  (03/18/2019)  Transportation Needs: No Transportation Needs (03/18/2019)  Depression (PHQ2-9): High Risk (03/11/2023)  Financial Resource Strain: Low Risk  (06/01/2019)  Physical Activity: Unknown (06/01/2019)  Stress: Stress Concern Present (09/01/2019)  Tobacco Use: Low Risk  (06/13/2023)     Readmission Risk Interventions     No data to display

## 2023-06-13 NOTE — Progress Notes (Signed)
Patient given AVS. Pt educated on contents of AVS and given the opportunity to ask questions. Questions clarified.

## 2023-06-13 NOTE — Progress Notes (Signed)
Occupational Therapy Treatment Patient Details Name: Jenny [name] MRN: [id number] DOB: 04/22/1961 Today's Date: 06/13/2023   History of present illness Pt is a 61 yo female admitted for C3-C4 ACDF.  PMH: C4-C6 ACDF, CA, DM, depression.   OT comments  Pt progressing toward established OT goals. Pt educated and demonstrating compensatory techniques for UB ADL, LB ADL, brace application, toileting, shower transfers, and grooming within precautions. Pt continues to be limited by intermittent lethargy. Pt pleasant, conversational, and following commands, but falling asleep on toilet requiring verbal cues to maintain arousal and then when ambulating back to EOB, pt with long pause in standing, requiring min multimodal cues to maintain arousal. Continue to recommend following physician orders for discharge.     Recommendations for follow up therapy are one component of a multi-disciplinary discharge planning process, led by the attending physician.  Recommendations may be updated based on patient status, additional functional criteria and insurance authorization.    Assistance Recommended at Discharge Intermittent Supervision/Assistance  Patient can return home with the following  A little help with walking and/or transfers;A little help with bathing/dressing/bathroom;Assistance with cooking/housework;Assist for transportation;Help with stairs or ramp for entrance   Equipment Recommendations  BSC/3in1;Other (comment) (Pt reports she has RW, but family seems unsure; need to confirm)    Recommendations for Other Services      Precautions / Restrictions Precautions Precautions: Fall;Cervical Precaution Booklet Issued: Yes (comment) Precaution Comments: all precautions reviewed within the context of ADL Required Braces or Orthoses: Cervical Brace Cervical Brace: Soft collar;For comfort Restrictions Weight Bearing Restrictions: No       Mobility Bed Mobility               General bed  mobility comments: EOB on arrival and departure    Transfers Overall transfer level: Needs assistance Equipment used: Rolling walker (2 wheels) Transfers: Sit to/from Stand Sit to Stand: Min guard           General transfer comment: min guard A for safety. Slow, steady rise.     Balance Overall balance assessment: Needs assistance Sitting-balance support: Feet supported Sitting balance-Leahy Scale: Fair     Standing balance support: Bilateral upper extremity supported, During functional activity Standing balance-Leahy Scale: Fair Standing balance comment: benefits from RW dynamically                           ADL either performed or assessed with clinical judgement   ADL Overall ADL's : Needs assistance/impaired                 Upper Body Dressing : Minimal assistance;Sitting Upper Body Dressing Details (indicate cue type and reason): Min A to adjust shirt. Lower Body Dressing: Min guard;Sit to/from stand;With adaptive equipment Lower Body Dressing Details (indicate cue type and reason): Min guard A for safety Toilet Transfer: Min guard;Rolling walker (2 wheels);Ambulation Toilet Transfer Details (indicate cue type and reason): min guard A with walker. Pt did require cues to cont advancing gait with onset of lethargy. Toileting- Clothing Manipulation and Hygiene: Supervision/safety;Sitting/lateral lean Toileting - Clothing Manipulation Details (indicate cue type and reason): reviewed compensatory techniques for posterior pericare as well. Tub/ Shower Transfer: Walk-in shower;Min guard;Ambulation;Rolling walker (2 wheels);BSC/3in1 Tub/Shower Transfer Details (indicate cue type and reason): min guard A for safety; cues for technique Functional mobility during ADLs: Min guard;Rolling walker (2 wheels) General ADL Comments: daughter in room.    Extremity/Trunk Assessment Upper Extremity Assessment Upper Extremity Assessment: Overall [facility]  for tasks assessed    Lower Extremity Assessment Lower Extremity Assessment: Overall WFL for tasks assessed        Vision   Vision Assessment?: No apparent visual deficits   Perception     Praxis      Cognition Arousal/Alertness: Lethargic Behavior During Therapy: Flat affect Overall Cognitive Status: Within Functional Limits for tasks assessed                                 General Comments: Intermittently groggy, but conversational and pleasant. Following all commands and maintaining precautions        Exercises      Shoulder Instructions       General Comments daughter present    Pertinent Vitals/ Pain       Pain Assessment Pain Assessment: Faces Faces Pain Scale: Hurts little more Pain Location: neck Pain Descriptors / Indicators: Aching, Grimacing, Sore Pain Intervention(s): Limited activity within patient's tolerance, Monitored during session  Home Living                                          Prior Functioning/Environment              Frequency  Min 2X/week        Progress Toward Goals  OT Goals(current goals can now be found in the care plan section)  Progress towards OT goals: Progressing toward goals  Acute Rehab OT Goals Patient Stated Goal: get better OT Goal Formulation: With patient Time For Goal Achievement: 06/26/23 Potential to Achieve Goals: Good ADL Goals Pt Will Perform Tub/Shower Transfer: Tub transfer;with min assist;ambulating Additional ADL Goal #1: Pt will walk to bathroom and toilet raised commode with supervision. Additional ADL Goal #2: Pt will dress self with min assist and daughter independently assisting with what pt cannot do.  Plan Discharge plan remains appropriate;Frequency remains appropriate    Co-evaluation                 AM-PAC OT "6 Clicks" Daily Activity     Outcome Measure   Help from another person eating meals?: None Help from another person taking care of personal  grooming?: None Help from another person toileting, which includes using toliet, bedpan, or urinal?: A Little Help from another person bathing (including washing, rinsing, drying)?: A Little Help from another person to put on and taking off regular upper body clothing?: A Little Help from another person to put on and taking off regular lower body clothing?: A Little 6 Click Score: 20    End of Session Equipment Utilized During Treatment: Rolling walker (2 wheels);Cervical collar  OT Visit Diagnosis: Unsteadiness on feet (R26.81)   Activity Tolerance Patient tolerated treatment well;Patient limited by lethargy (intermittent lethargy)   Patient Left in bed;with call bell/phone within reach;with family/visitor present   Nurse Communication Mobility status        Time: 4098-1191 OT Time Calculation (min): 33 min  Charges: OT General Charges $OT Visit: 1 Visit OT Treatments $Self Care/Home Management : 23-37 mins  Tyler Deis, OTR/L [facility] Acute Rehabilitation Office: [phone]   Myrla Halsted 06/13/2023, 8:43 AM

## 2023-06-14 ENCOUNTER — Inpatient Hospital Stay (HOSPITAL_COMMUNITY)
Admission: EM | Admit: 2023-06-14 | Discharge: 2023-06-18 | DRG: 982 | Disposition: A | Discharge status: Home / Self Care | Payer: Medicare PPO | Attending: Neurosurgery | Admitting: Neurosurgery

## 2023-06-14 ENCOUNTER — Emergency Department (HOSPITAL_COMMUNITY): Payer: Medicare PPO

## 2023-06-14 ENCOUNTER — Encounter (HOSPITAL_COMMUNITY): Payer: Self-pay | Admitting: *Deleted

## 2023-06-14 ENCOUNTER — Other Ambulatory Visit: Payer: Self-pay

## 2023-06-14 DIAGNOSIS — Z6841 Body Mass Index (BMI) 40.0 and over, adult: Secondary | ICD-10-CM | POA: Diagnosis not present

## 2023-06-14 DIAGNOSIS — Z85038 Personal history of other malignant neoplasm of large intestine: Secondary | ICD-10-CM | POA: Diagnosis not present

## 2023-06-14 DIAGNOSIS — K760 Fatty (change of) liver, not elsewhere classified: Secondary | ICD-10-CM | POA: Diagnosis present

## 2023-06-14 DIAGNOSIS — Z9221 Personal history of antineoplastic chemotherapy: Secondary | ICD-10-CM

## 2023-06-14 DIAGNOSIS — M4722 Other spondylosis with radiculopathy, cervical region: Secondary | ICD-10-CM | POA: Diagnosis present

## 2023-06-14 DIAGNOSIS — Z7985 Long-term (current) use of injectable non-insulin antidiabetic drugs: Secondary | ICD-10-CM | POA: Diagnosis not present

## 2023-06-14 DIAGNOSIS — Z8249 Family history of ischemic heart disease and other diseases of the circulatory system: Secondary | ICD-10-CM

## 2023-06-14 DIAGNOSIS — Z7984 Long term (current) use of oral hypoglycemic drugs: Secondary | ICD-10-CM | POA: Diagnosis not present

## 2023-06-14 DIAGNOSIS — Z90711 Acquired absence of uterus with remaining cervical stump: Secondary | ICD-10-CM

## 2023-06-14 DIAGNOSIS — E119 Type 2 diabetes mellitus without complications: Secondary | ICD-10-CM | POA: Diagnosis present

## 2023-06-14 DIAGNOSIS — Z79899 Other long term (current) drug therapy: Secondary | ICD-10-CM

## 2023-06-14 DIAGNOSIS — E041 Nontoxic single thyroid nodule: Secondary | ICD-10-CM | POA: Diagnosis present

## 2023-06-14 DIAGNOSIS — E7800 Pure hypercholesterolemia, unspecified: Secondary | ICD-10-CM | POA: Diagnosis present

## 2023-06-14 DIAGNOSIS — Z91013 Allergy to seafood: Secondary | ICD-10-CM | POA: Diagnosis not present

## 2023-06-14 DIAGNOSIS — Z818 Family history of other mental and behavioral disorders: Secondary | ICD-10-CM | POA: Diagnosis not present

## 2023-06-14 DIAGNOSIS — R1310 Dysphagia, unspecified: Principal | ICD-10-CM

## 2023-06-14 DIAGNOSIS — F4310 Post-traumatic stress disorder, unspecified: Secondary | ICD-10-CM | POA: Diagnosis present

## 2023-06-14 DIAGNOSIS — C772 Secondary and unspecified malignant neoplasm of intra-abdominal lymph nodes: Secondary | ICD-10-CM | POA: Diagnosis present

## 2023-06-14 DIAGNOSIS — M4802 Spinal stenosis, cervical region: Secondary | ICD-10-CM | POA: Diagnosis present

## 2023-06-14 DIAGNOSIS — M4712 Other spondylosis with myelopathy, cervical region: Secondary | ICD-10-CM | POA: Diagnosis present

## 2023-06-14 LAB — COMPREHENSIVE METABOLIC PANEL
ALT: 16 U/L (ref 0–44)
AST: 17 U/L (ref 15–41)
Albumin: 3.9 g/dL (ref 3.5–5.0)
Alkaline Phosphatase: 59 U/L (ref 38–126)
Anion gap: 13 (ref 5–15)
BUN: 13 mg/dL (ref 6–20)
CO2: 23 mmol/L (ref 22–32)
Calcium: 9.2 mg/dL (ref 8.9–10.3)
Chloride: 103 mmol/L (ref 98–111)
Creatinine, Ser: 0.76 mg/dL (ref 0.44–1.00)
GFR, Estimated: >60 mL/min (ref >60)
Glucose, Bld: 115 mg/dL — ABNORMAL HIGH (ref 70–99)
Potassium: 3.6 mmol/L (ref 3.5–5.1)
Sodium: 139 mmol/L (ref 135–145)
Total Bilirubin: 1.2 mg/dL (ref 0.3–1.2)
Total Protein: 7.7 g/dL (ref 6.5–8.1)

## 2023-06-14 LAB — LIPASE, BLOOD: Lipase: 26 U/L (ref 11–51)

## 2023-06-14 LAB — CBC
HCT: 41.7 % (ref 36.0–46.0)
Hemoglobin: 13.2 g/dL (ref 12.0–15.0)
MCH: 26.4 pg (ref 26.0–34.0)
MCHC: 31.7 g/dL (ref 30.0–36.0)
MCV: 83.4 fL (ref 80.0–100.0)
Platelets: 259 10*3/uL (ref 150–400)
RBC: 5 MIL/uL (ref 3.87–5.11)
RDW: 16.2 % — ABNORMAL HIGH (ref 11.5–15.5)
WBC: 7.1 10*3/uL (ref 4.0–10.5)
nRBC: 0 % (ref 0.0–0.2)

## 2023-06-14 LAB — CBG MONITORING, ED: Glucose-Capillary: 100 mg/dL — ABNORMAL HIGH (ref 70–99)

## 2023-06-14 MED ORDER — ONDANSETRON HCL 4 MG/2ML IJ SOLN: 4.0000 mg | Freq: Four times a day (QID) | INTRAMUSCULAR | Status: DC | PRN

## 2023-06-14 MED ORDER — DEXAMETHASONE SODIUM PHOSPHATE 4 MG/ML IJ SOLN: 4.0000 mg | Freq: Four times a day (QID) | INTRAMUSCULAR | Status: DC

## 2023-06-14 MED ORDER — TRAZODONE HCL 50 MG PO TABS: 50.0000 mg | ORAL_TABLET | Freq: Every evening | ORAL | Status: DC | PRN

## 2023-06-14 MED ORDER — BUPROPION HCL ER (XL) 150 MG PO TB24: 150.0000 mg | ORAL_TABLET | Freq: Every day | ORAL | Status: DC

## 2023-06-14 MED ORDER — HYDROMORPHONE HCL 1 MG/ML IJ SOLN: 0.5000 mg | INTRAMUSCULAR | Status: DC | PRN

## 2023-06-14 MED ORDER — POLYETHYLENE GLYCOL 3350 17 G PO PACK
17.0000 g | PACK | Freq: Every day | ORAL | Status: DC | PRN
  Administered 2023-06-18: 17 g via ORAL
  Filled 2023-06-14: qty 1

## 2023-06-14 MED ORDER — SODIUM CHLORIDE 0.9% FLUSH
3.0000 mL | Freq: Two times a day (BID) | INTRAVENOUS | Status: DC
  Administered 2023-06-14 – 2023-06-18 (×6): 3 mL via INTRAVENOUS

## 2023-06-14 MED ORDER — ONDANSETRON HCL 4 MG/2ML IJ SOLN
4.0000 mg | Freq: Once | INTRAMUSCULAR | Status: AC
  Administered 2023-06-14: 4 mg via INTRAVENOUS
  Filled 2023-06-14: qty 2

## 2023-06-14 MED ORDER — LORATADINE 10 MG PO TABS
10.0000 mg | ORAL_TABLET | Freq: Every day | ORAL | Status: DC
  Administered 2023-06-15 – 2023-06-18 (×4): 10 mg via ORAL
  Filled 2023-06-14 (×4): qty 1

## 2023-06-14 MED ORDER — ONDANSETRON HCL 4 MG PO TABS: 4.0000 mg | ORAL_TABLET | Freq: Four times a day (QID) | ORAL | Status: DC | PRN

## 2023-06-14 MED ORDER — HYDROCODONE-ACETAMINOPHEN 5-325 MG PO TABS
1.0000 | ORAL_TABLET | ORAL | Status: DC | PRN
  Administered 2023-06-16 – 2023-06-18 (×3): 1 via ORAL
  Filled 2023-06-14 (×3): qty 1

## 2023-06-14 MED ORDER — SODIUM CHLORIDE 0.9 % IV BOLUS
1000.0000 mL | Freq: Once | INTRAVENOUS | Status: AC
  Administered 2023-06-14: 1000 mL via INTRAVENOUS

## 2023-06-14 MED ORDER — DEXAMETHASONE SODIUM PHOSPHATE 4 MG/ML IJ SOLN
4.0000 mg | Freq: Four times a day (QID) | INTRAMUSCULAR | Status: DC
  Administered 2023-06-15 – 2023-06-18 (×15): 4 mg via INTRAVENOUS
  Filled 2023-06-14 (×15): qty 1

## 2023-06-14 MED ORDER — IOHEXOL 350 MG/ML SOLN
75.0000 mL | Freq: Once | INTRAVENOUS | Status: AC | PRN
  Administered 2023-06-14: 75 mL via INTRAVENOUS

## 2023-06-14 MED ORDER — HYDROMORPHONE HCL 1 MG/ML IJ SOLN
0.5000 mg | Freq: Once | INTRAMUSCULAR | Status: AC
  Administered 2023-06-14: 0.5 mg via INTRAVENOUS
  Filled 2023-06-14: qty 1

## 2023-06-14 MED ORDER — SODIUM CHLORIDE 0.9% FLUSH: 3.0000 mL | INTRAVENOUS | Status: DC | PRN

## 2023-06-14 MED ORDER — KETOROLAC TROMETHAMINE 15 MG/ML IJ SOLN
15.0000 mg | Freq: Four times a day (QID) | INTRAMUSCULAR | Status: AC
  Administered 2023-06-15 (×4): 15 mg via INTRAVENOUS
  Filled 2023-06-14 (×4): qty 1

## 2023-06-14 MED ORDER — VENLAFAXINE HCL ER 37.5 MG PO CP24: 37.5000 mg | ORAL_CAPSULE | Freq: Every day | ORAL | Status: DC

## 2023-06-14 MED ORDER — POTASSIUM CHLORIDE IN NACL 20-0.9 MEQ/L-% IV SOLN
INTRAVENOUS | Status: DC
  Filled 2023-06-14 (×6): qty 1000

## 2023-06-14 MED ORDER — EMPAGLIFLOZIN 10 MG PO TABS
10.0000 mg | ORAL_TABLET | Freq: Every day | ORAL | Status: DC
  Administered 2023-06-16 – 2023-06-18 (×3): 10 mg via ORAL
  Filled 2023-06-14 (×4): qty 1

## 2023-06-14 MED ORDER — PHENOL 1.4 % MT LIQD: 1.0000 | OROMUCOSAL | Status: DC | PRN

## 2023-06-14 MED ORDER — ATORVASTATIN CALCIUM 10 MG PO TABS
20.0000 mg | ORAL_TABLET | Freq: Every day | ORAL | Status: DC
  Administered 2023-06-15 – 2023-06-18 (×4): 20 mg via ORAL
  Filled 2023-06-14 (×4): qty 2

## 2023-06-14 MED ORDER — INSULIN ASPART 100 UNIT/ML IJ SOLN
0.0000 [IU] | Freq: Three times a day (TID) | INTRAMUSCULAR | Status: DC
  Administered 2023-06-15 (×2): 2 [IU] via SUBCUTANEOUS
  Administered 2023-06-15 – 2023-06-16 (×3): 3 [IU] via SUBCUTANEOUS
  Administered 2023-06-16: 2 [IU] via SUBCUTANEOUS
  Administered 2023-06-17 – 2023-06-18 (×5): 3 [IU] via SUBCUTANEOUS

## 2023-06-14 MED ORDER — DOCUSATE SODIUM 100 MG PO CAPS
100.0000 mg | ORAL_CAPSULE | Freq: Two times a day (BID) | ORAL | Status: DC
  Administered 2023-06-15 – 2023-06-16 (×3): 100 mg via ORAL
  Filled 2023-06-14 (×4): qty 1

## 2023-06-14 MED ORDER — HYDROCODONE-ACETAMINOPHEN 5-325 MG PO TABS
2.0000 | ORAL_TABLET | ORAL | Status: DC | PRN
  Administered 2023-06-15 – 2023-06-18 (×8): 2 via ORAL
  Filled 2023-06-14 (×8): qty 2

## 2023-06-14 MED ORDER — BUPROPION HCL ER (XL) 300 MG PO TB24: 300.0000 mg | ORAL_TABLET | Freq: Every day | ORAL | Status: DC

## 2023-06-14 MED ORDER — METFORMIN HCL ER 500 MG PO TB24
500.0000 mg | ORAL_TABLET | Freq: Two times a day (BID) | ORAL | Status: DC
  Filled 2023-06-14: qty 1

## 2023-06-14 MED ORDER — CYCLOBENZAPRINE HCL 10 MG PO TABS: 10.0000 mg | ORAL_TABLET | Freq: Three times a day (TID) | ORAL | Status: DC | PRN

## 2023-06-14 MED ORDER — SODIUM CHLORIDE 0.9 % IV SOLN
250.0000 mL | INTRAVENOUS | Status: DC
  Administered 2023-06-14: 250 mL via INTRAVENOUS

## 2023-06-14 MED ORDER — ACETAMINOPHEN 650 MG RE SUPP: 650.0000 mg | RECTAL | Status: DC | PRN

## 2023-06-14 MED ORDER — INSULIN ASPART 100 UNIT/ML IJ SOLN
0.0000 [IU] | Freq: Every day | INTRAMUSCULAR | Status: DC
  Administered 2023-06-15: 2 [IU] via SUBCUTANEOUS

## 2023-06-14 MED ORDER — ACETAMINOPHEN 325 MG PO TABS
650.0000 mg | ORAL_TABLET | ORAL | Status: DC | PRN
  Administered 2023-06-16 – 2023-06-17 (×2): 650 mg via ORAL
  Filled 2023-06-14 (×2): qty 2

## 2023-06-14 MED ORDER — FLEET ENEMA 7-19 GM/118ML RE ENEM: 1.0000 | ENEMA | Freq: Once | RECTAL | Status: DC | PRN

## 2023-06-14 MED ORDER — MENTHOL 3 MG MT LOZG: 1.0000 | LOZENGE | OROMUCOSAL | Status: DC | PRN

## 2023-06-14 MED ORDER — GABAPENTIN 300 MG PO CAPS
300.0000 mg | ORAL_CAPSULE | Freq: Two times a day (BID) | ORAL | Status: DC
  Administered 2023-06-15 – 2023-06-18 (×7): 300 mg via ORAL
  Filled 2023-06-14 (×7): qty 1

## 2023-06-14 NOTE — H&P (Signed)
CC: Dysphagia   HPI:        Patient is a 62 y.o. female presents with dysphagia following ACDF C3-4 on 06/12/23 w/ Dr. Jordan Likes. H/o ACDF C4-6. PMH T2DM. Pt states dysphagia has continued to worsen, so that she cannot swallow any medications. No new BUE/BLE symptoms, gait disturbance, B&B dysfunction, SOB/CP, AMS.            Patient Active Problem List    Diagnosis Date Noted   Cervical spondylosis with myelopathy and radiculopathy 06/12/2023   Fatigue due to treatment 02/11/2022   Excessive daytime sleepiness 02/11/2022   Chronic pain after treatment for malignant neoplasm 02/11/2022   Nocturia 02/11/2022   Class 3 severe obesity without serious comorbidity in adult [facility]) 02/11/2022   Hypercholesteremia 09/27/2021   MDD (major depressive disorder), recurrent, in partial remission (HCC) 12/23/2019   PTSD (post-traumatic stress disorder) 12/23/2019   Hypersomnolence 07/27/2018   Pulmonary artery abnormality 07/01/2018   Adjustment disorder with mixed anxiety and depressed mood 02/11/2018   Chemotherapy-induced peripheral neuropathy (HCC) 01/06/2016   Type 2 diabetes mellitus without complications (HCC) 12/07/2015   Thyromegaly 12/14/2014   Cancer of sigmoid colon metastatic to intra-abdominal lymph node (HCC) 11/14/2014   Neck pain, bilateral 05/05/2012   Obesity 05/29/2009        Past Medical History:  Diagnosis Date   Anemia     Anxiety     Arthritis     Cancer (HCC)      colon cancer    Depression     Diabetes mellitus, type 2 (HCC)     Enlarged thyroid     Fatty liver           Past Surgical History:  Procedure Laterality Date   ANTERIOR CERVICAL DECOMP/DISCECTOMY FUSION N/A 06/12/2023    Procedure: ANTERIOR CERVICAL DISCECTOMY AND FUSION, CERVICAL THREE-CERVICAL FOUR;  Surgeon: Julio Sicks, MD;  Location: [facility] OR;  Service: Neurosurgery;  Laterality: N/A;  3C   BOWEL RESECTION       CERVICAL SPINE SURGERY   06/17/2012    C5-C7 ACDF   CESAREAN SECTION       PARTIAL  HYSTERECTOMY       PORT-A-CATH REMOVAL Left 07/28/2015    Procedure: REMOVAL PORT-A-CATH;  Surgeon: Romie Levee, MD;  Location: [facility] ORS;  Service: General;  Laterality: Left;   PORTACATH PLACEMENT Left 12/22/2014    Procedure: INSERTION PORT-A-CATH LEFT SUBCLAVIAN;  Surgeon: Romie Levee, MD;  Location: [facility] ORS;  Service: General;  Laterality: Left;   TONSILLECTOMY        (Not in a hospital admission)        Allergies  Allergen Reactions   Other Rash      *Derma Bond*   Attends Briefs Small Other (See Comments)   Shellfish Allergy Rash    Social History         Tobacco Use   Smoking status: Never      Passive exposure: Never   Smokeless tobacco: Never  Substance Use Topics   Alcohol use: No         Family History  Problem Relation Age of Onset   Cancer Brother     Depression Brother     Cancer Brother     Hypertension Mother     Colon cancer Neg Hx        Objective:    Patient Vitals for the past 8 hrs:   BP Temp Pulse Resp SpO2 Height Weight  06/14/23 1815 122/87 -- (!) 103  11 100 % -- --  06/14/23 1719 -- -- -- -- -- 5\' 3"  (1.6 m) 105.7 kg  06/14/23 1707 126/86 99.3 F (37.4 C) (!) 108 19 98 % -- --    No intake/output data recorded. No intake/output data recorded.   Awake, alert, oriented Speech fluent, appropriate CN grossly intact 5/5 BUE/BLE Wound c/d/I Neck soft, tender SILTx4 FC, MAE x4     Data ReviewCBC:  Labs (Brief)       Lab Results  Component Value Date    WBC 7.1 06/14/2023    RBC 5.00 06/14/2023      BMP:  Labs (Brief)       Lab Results  Component Value Date    GLUCOSE 115 (H) 06/14/2023    GLUCOSE 138 06/18/2017    CHLORIDE 106 06/18/2017    CO2 23 06/14/2023    CO2 25 06/18/2017    BUN 13 06/14/2023    BUN 15 07/10/2021    BUN 14.8 06/18/2017    CREATININE 0.76 06/14/2023    CREATININE 0.8 06/18/2017    CALCIUM 9.2 06/14/2023    CALCIUM 10.1 06/18/2017      Coagulation:  Labs (Brief)  No results found for:  "INR", "APTT"   Cardiac markers:  Labs (Brief)  No results found for: "CKMB", "TROPONINT", "MYOGLOBIN"   ABGs:  Labs (Brief)  No results found for: "PH"     CLINICAL DATA:  Soft tissue infection suspected. Recent cervical spine surgery   EXAM: CT NECK WITH CONTRAST   TECHNIQUE: Multidetector CT imaging of the neck was performed using the standard protocol following the bolus administration of intravenous contrast.   RADIATION DOSE REDUCTION: This exam was performed according to the departmental dose-optimization program which includes automated exposure control, adjustment of the mA and/or kV according to patient size and/or use of iterative reconstruction technique.   CONTRAST:  75mL OMNIPAQUE IOHEXOL 350 MG/ML SOLN   COMPARISON:  None Available.   FINDINGS: Pharynx and larynx: There is a large amount of retropharyngeal fluid and edema measuring approximately 2.0 cm at the C3 level. There is mild narrowing of the supraglottic airway. The pharyngeal mucosal structures are normal. Normal epiglottis.   Salivary glands: No inflammation, mass, or stone.   Thyroid: Multinodular goiter primarily affecting the right thyroid lobe.   Lymph nodes: None enlarged or abnormal density.   Vascular: Negative.   Limited intracranial: Negative.   Visualized orbits: Negative.   Mastoids and visualized paranasal sinuses: Clear.   Skeleton: C3-6 ACDF. There is small amount of gas in the right neck along the surgical approach. There is moderate right parapharyngeal edema.   Upper chest: Negative.   Other: None.   IMPRESSION: 1. Recent C3-6 ACDF with large amount of retropharyngeal fluid and right parapharyngeal edema. There is mass effect on the larynx and esophagus mild narrowing of the supraglottic airway. These findings may be postsurgical. No specific evidence of infection, though this remains difficult to exclude. 2. Multinodular goiter primarily affecting the right  thyroid lobe. This has been previously assessed by ultrasound.     Assessment:    Dysphagia following ACDF C3-4 on 06/12/23   Plan:    -Admit to for supportive care -IV Decadron -Pain mgmt optimization -Bowel regimen -Call w/ questions/concerns.    Patrici Ranks, [facility]

## 2023-06-14 NOTE — ED Triage Notes (Signed)
The pt has neck surgery Thursday anterior neck.  The pt is unable to swallow  she can swallow but the liquid or anything comes right back up  it will not go down all the way

## 2023-06-14 NOTE — ED Provider Notes (Signed)
[place] EMERGENCY DEPARTMENT AT [facility] Provider Note   CSN: [id number] Arrival date & time: 06/14/23  1657     History  Chief Complaint  Patient presents with   Dysphagia    SUSANNAH CARBIN is a 62 y.o. female who presents emergency department with chief complaint of inability to swallow.  Patient underwent an anterior cervical discectomy on 06/12/2023.  She was discharged home and states that yesterday she was able to eat oatmeal once but after that began having inability to swallow anything.  She states that she has tried swallowing water, food, pills without any ability to get down.  She reports that she is not having nausea or vomiting however when she tries to swallow it seems to get stuck and then she regurgitates anything she tries to swallow back up.  She states it feels like it is in her throat.  She has a history of goiter.  She denies fevers chills or change in voice.  HPI     Home Medications Prior to Admission medications   Medication Sig Start Date End Date Taking? Authorizing Provider  acetaminophen (TYLENOL) 650 MG CR tablet Take 1,300 mg by mouth every 8 (eight) hours as needed for pain.   Yes [provider]  ARIPiprazole (ABILIFY) 10 MG tablet Take 1 tablet (10 mg total) by mouth daily. 06/24/23 09/22/23 Yes Hisada, Barbee Cough, MD  atorvastatin (LIPITOR) 20 MG tablet TAKE 1 TABLET BY MOUTH EVERY DAY 09/20/22  Yes Chambliss, Estill Batten, MD  buPROPion (WELLBUTRIN XL) 150 MG 24 hr tablet Take 1 tablet (150 mg total) by mouth daily. Take total of 450 mg daily (300 mg + 150 mg) 05/02/23 10/29/23 Yes Hisada, Barbee Cough, MD  buPROPion (WELLBUTRIN XL) 300 MG 24 hr tablet Take 1 tablet (300 mg total) by mouth daily. Take total of 450 mg daily, along with 150 mg daily 05/02/23 10/29/23 Yes Hisada, Barbee Cough, MD  diclofenac Sodium (VOLTAREN) 1 % GEL Apply 2 g topically 4 (four) times daily. 09/17/21  Yes Cathlyn Parsons, NP  fluticasone (FLONASE) 50 MCG/ACT nasal spray Place 2  sprays into both nostrils daily. Patient taking differently: Place 2 sprays into both nostrils daily as needed for allergies. 02/01/17  Yes Alene Mires, NP  gabapentin (NEURONTIN) 300 MG capsule Take 1 capsule (300 mg total) by mouth 3 (three) times daily. Take 2-3 capsules before bed Patient taking differently: Take 300 mg by mouth 2 (two) times daily. Take 2-3 capsules before bed 12/19/22  Yes Chambliss, Estill Batten, MD  HYDROcodone-acetaminophen (NORCO) 10-325 MG tablet Take 1 tablet by mouth every 4 (four) hours as needed for severe pain ((score 7 to 10)). 06/13/23  Yes Bergman, Meghan D, NP  hydrocortisone cream 0.5 % Apply 1 application  topically 2 (two) times daily as needed for itching.   Yes [provider]  JARDIANCE 10 MG TABS tablet TAKE 1 TABLET BY MOUTH EVERY DAY Patient taking differently: Take 10 mg by mouth daily. 01/02/23  Yes Carney Living, MD  loratadine (CLARITIN) 10 MG tablet Take 1 tablet (10 mg total) by mouth daily. AS NEEDED Patient taking differently: Take 10 mg by mouth daily as needed for allergies. 03/08/20  Yes Chambliss, Estill Batten, MD  metFORMIN (GLUCOPHAGE-XR) 500 MG 24 hr tablet TAKE 2 TABLETS BY MOUTH TWICE A DAY Patient taking differently: Take 500 mg by mouth 2 (two) times daily with a meal. 01/02/23  Yes Chambliss, Estill Batten, MD  methocarbamol (ROBAXIN) 500 MG tablet Take  1 tablet (500 mg total) by mouth 4 (four) times daily. 02/26/23  Yes Sabino Dick, DO  Multiple Vitamin (MULTIVITAMIN) tablet Take 1 tablet by mouth daily.   Yes [provider]  Semaglutide, 2 MG/DOSE, 8 MG/3ML SOPN Inject 2 mg into the skin once a week. Patient taking differently: Inject 2 mg into the skin once a week. Tuesday 03/11/23  Yes Carney Living, MD  traZODone (DESYREL) 50 MG tablet Take 1 tablet (50 mg total) by mouth at bedtime as needed for sleep. 06/04/23 12/01/23 Yes [name], MD  venlafaxine XR (EFFEXOR-XR) 150 MG 24 hr capsule  Take 1 capsule (150 mg total) by mouth daily. Total of 187.5 mg daily. Take along with 37.5 mg cap 07/14/23 01/10/24 Yes Hisada, Barbee Cough, MD  venlafaxine XR (EFFEXOR-XR) 37.5 MG 24 hr capsule Take 1 capsule (37.5 mg total) by mouth daily. TAKE 1 CAPSULE BY MOUTH DAILY ALONG WITH THE 150 MG FOR TOTAL DAILY DOSE OF 187.5 MG 02/15/23 08/14/23 Yes Hisada, Barbee Cough, MD  cyclobenzaprine (FLEXERIL) 10 MG tablet Take 1 tablet (10 mg total) by mouth 3 (three) times daily as needed for muscle spasms. Patient not taking: Reported on 06/14/2023 06/13/23   Val Eagle D, NP      Allergies    Other, Attends briefs small, and Shellfish allergy    Review of Systems   Review of Systems  Physical Exam Updated Vital Signs BP 117/62   Pulse 92   Temp 99.3 F (37.4 C)   Resp 20   Ht 5\' 3"  (1.6 m)   Wt 105.7 kg   SpO2 98%   BMI 41.28 kg/m  Physical Exam Vitals and nursing note reviewed.  Constitutional:      General: She is not in acute distress.    Appearance: She is well-developed. She is not diaphoretic.  HENT:     Head: Normocephalic and atraumatic.     Right Ear: External ear normal.     Left Ear: External ear normal.     Nose: Nose normal.     Mouth/Throat:     Mouth: Mucous membranes are moist.  Eyes:     General: No scleral icterus.    Conjunctiva/sclera: Conjunctivae normal.  Neck:     Comments: Patient in soft cervical collar.  I remove the collar to assess the wound which appears well-healing without evidence of infection or swelling Cardiovascular:     Rate and Rhythm: Normal rate and regular rhythm.     Heart sounds: Normal heart sounds. No murmur heard.    No friction rub. No gallop.  Pulmonary:     Effort: Pulmonary effort is normal. No respiratory distress.     Breath sounds: Normal breath sounds.  Abdominal:     General: Bowel sounds are normal. There is no distension.     Palpations: Abdomen is soft. There is no mass.     Tenderness: There is no abdominal tenderness. There is  no guarding.  Skin:    General: Skin is warm and dry.  Neurological:     Mental Status: She is alert and oriented to person, place, and time.  Psychiatric:        Behavior: Behavior normal.     ED Results / Procedures / Treatments   Labs (all labs ordered are listed, but only abnormal results are displayed) Labs Reviewed  COMPREHENSIVE METABOLIC PANEL - Abnormal; Notable for the following components:      Result Value   Glucose, Bld 115 (*)  All other components within normal limits  CBC - Abnormal; Notable for the following components:   RDW 16.2 (*)    All other components within normal limits  CBG MONITORING, ED - Abnormal; Notable for the following components:   Glucose-Capillary 100 (*)    All other components within normal limits  LIPASE, BLOOD  URINALYSIS, ROUTINE W REFLEX MICROSCOPIC  CBC  BASIC METABOLIC PANEL    EKG None  Radiology CT Soft Tissue Neck W Contrast  Result Date: 06/14/2023 CLINICAL DATA:  Soft tissue infection suspected. Recent cervical spine surgery EXAM: CT NECK WITH CONTRAST TECHNIQUE: Multidetector CT imaging of the neck was performed using the standard protocol following the bolus administration of intravenous contrast. RADIATION DOSE REDUCTION: This exam was performed according to the departmental dose-optimization program which includes automated exposure control, adjustment of the mA and/or kV according to patient size and/or use of iterative reconstruction technique. CONTRAST:  75mL OMNIPAQUE IOHEXOL 350 MG/ML SOLN COMPARISON:  None Available. FINDINGS: Pharynx and larynx: There is a large amount of retropharyngeal fluid and edema measuring approximately 2.0 cm at the C3 level. There is mild narrowing of the supraglottic airway. The pharyngeal mucosal structures are normal. Normal epiglottis. Salivary glands: No inflammation, mass, or stone. Thyroid: Multinodular goiter primarily affecting the right thyroid lobe. Lymph nodes: None enlarged or  abnormal density. Vascular: Negative. Limited intracranial: Negative. Visualized orbits: Negative. Mastoids and visualized paranasal sinuses: Clear. Skeleton: C3-6 ACDF. There is small amount of gas in the right neck along the surgical approach. There is moderate right parapharyngeal edema. Upper chest: Negative. Other: None. IMPRESSION: 1. Recent C3-6 ACDF with large amount of retropharyngeal fluid and right parapharyngeal edema. There is mass effect on the larynx and esophagus mild narrowing of the supraglottic airway. These findings may be postsurgical. No specific evidence of infection, though this remains difficult to exclude. 2. Multinodular goiter primarily affecting the right thyroid lobe. This has been previously assessed by ultrasound. Electronically Signed   By: Deatra Robinson M.D.   On: 06/14/2023 19:59    Procedures Procedures    Medications Ordered in ED Medications  dexamethasone (DECADRON) injection 4 mg (has no administration in time range)  atorvastatin (LIPITOR) tablet 20 mg (has no administration in time range)  buPROPion (WELLBUTRIN XL) 24 hr tablet 150 mg (has no administration in time range)  buPROPion (WELLBUTRIN XL) 24 hr tablet 300 mg (has no administration in time range)  traZODone (DESYREL) tablet 50 mg (has no administration in time range)  venlafaxine XR (EFFEXOR-XR) 24 hr capsule 37.5 mg (has no administration in time range)  empagliflozin (JARDIANCE) tablet 10 mg (has no administration in time range)  metFORMIN (GLUCOPHAGE-XR) 24 hr tablet 500 mg (has no administration in time range)  cyclobenzaprine (FLEXERIL) tablet 10 mg (has no administration in time range)  gabapentin (NEURONTIN) capsule 300 mg (300 mg Oral Not Given 06/14/23 2227)  loratadine (CLARITIN) tablet 10 mg (has no administration in time range)  sodium chloride flush (NS) 0.9 % injection 3 mL (3 mLs Intravenous Given 06/14/23 2245)  sodium chloride flush (NS) 0.9 % injection 3 mL (has no administration  in time range)  0.9 %  sodium chloride infusion (250 mLs Intravenous New Bag/Given 06/14/23 2245)  acetaminophen (TYLENOL) tablet 650 mg (has no administration in time range)    Or  acetaminophen (TYLENOL) suppository 650 mg (has no administration in time range)  ondansetron (ZOFRAN) tablet 4 mg (has no administration in time range)    Or  ondansetron (ZOFRAN) injection  4 mg (has no administration in time range)  menthol-cetylpyridinium (CEPACOL) lozenge 3 mg (has no administration in time range)    Or  phenol (CHLORASEPTIC) mouth spray 1 spray (has no administration in time range)  0.9 % NaCl with KCl 20 mEq/ L  infusion ( Intravenous New Bag/Given 06/14/23 2255)  ketorolac (TORADOL) 15 MG/ML injection 15 mg (has no administration in time range)  HYDROcodone-acetaminophen (NORCO/VICODIN) 5-325 MG per tablet 1 tablet (has no administration in time range)  HYDROcodone-acetaminophen (NORCO/VICODIN) 5-325 MG per tablet 2 tablet (has no administration in time range)  HYDROmorphone (DILAUDID) injection 0.5 mg (has no administration in time range)  docusate sodium (COLACE) capsule 100 mg (100 mg Oral Not Given 06/14/23 2228)  polyethylene glycol (MIRALAX / GLYCOLAX) packet 17 g (has no administration in time range)  sodium phosphate (FLEET) 7-19 GM/118ML enema 1 enema (has no administration in time range)  insulin aspart (novoLOG) injection 0-15 Units (has no administration in time range)  insulin aspart (novoLOG) injection 0-5 Units ( Subcutaneous Not Given 06/14/23 2245)  sodium chloride 0.9 % bolus 1,000 mL (0 mLs Intravenous Stopped 06/14/23 2208)  HYDROmorphone (DILAUDID) injection 0.5 mg (0.5 mg Intravenous Given 06/14/23 1909)  ondansetron (ZOFRAN) injection 4 mg (4 mg Intravenous Given 06/14/23 1909)  iohexol (OMNIPAQUE) 350 MG/ML injection 75 mL (75 mLs Intravenous Contrast Given 06/14/23 1930)    ED Course/ Medical Decision Making/ A&P                             Medical Decision  Making Amount and/or Complexity of Data Reviewed Radiology: ordered.  Risk Prescription drug management. Decision regarding hospitalization.   62 year old female who presents emergency department with dysphagia after recent anterior neck surgery.  She has comorbidity of thyromegaly and goiter.  She is complaining of inability to swallow, differential diagnosis includes abscess, seroma.  I ordered and visualized images of the neck which showed a large fluid collection.  Labs reviewed are reassuring.  I discussed the case with Paticia Stack who will admit the patient for fluid resuscitation.  Her airway is intact throughout visit.  Fever.        Final Clinical Impression(s) / ED Diagnoses Final diagnoses:  Dysphagia, unspecified type    Rx / DC Orders ED Discharge Orders          Ordered    Incentive spirometry RT        06/14/23 2211              Arthor Captain, PA-C 06/14/23 2347    Lonell Grandchild, MD 06/15/23 1520

## 2023-06-14 NOTE — Progress Notes (Deleted)
CC: Dysphagia  HPI:     Patient is a 62 y.o. female presents with dysphagia following ACDF C3-4 on 06/12/23 w/ Dr. Jordan Likes. H/o ACDF C4-6. PMH T2DM. Pt states dysphagia has continued to worsen, so that she cannot swallow any medications. No new BUE/BLE symptoms, gait disturbance, B&B dysfunction, SOB/CP, AMS.     Patient Active Problem List   Diagnosis Date Noted   Cervical spondylosis with myelopathy and radiculopathy 06/12/2023   Fatigue due to treatment 02/11/2022   Excessive daytime sleepiness 02/11/2022   Chronic pain after treatment for malignant neoplasm 02/11/2022   Nocturia 02/11/2022   Class 3 severe obesity without serious comorbidity in adult [facility]) 02/11/2022   Hypercholesteremia 09/27/2021   MDD (major depressive disorder), recurrent, in partial remission (HCC) 12/23/2019   PTSD (post-traumatic stress disorder) 12/23/2019   Hypersomnolence 07/27/2018   Pulmonary artery abnormality 07/01/2018   Adjustment disorder with mixed anxiety and depressed mood 02/11/2018   Chemotherapy-induced peripheral neuropathy (HCC) 01/06/2016   Type 2 diabetes mellitus without complications (HCC) 12/07/2015   Thyromegaly 12/14/2014   Cancer of sigmoid colon metastatic to intra-abdominal lymph node (HCC) 11/14/2014   Neck pain, bilateral 05/05/2012   Obesity 05/29/2009   Past Medical History:  Diagnosis Date   Anemia    Anxiety    Arthritis    Cancer (HCC)    colon cancer    Depression    Diabetes mellitus, type 2 (HCC)    Enlarged thyroid    Fatty liver     Past Surgical History:  Procedure Laterality Date   ANTERIOR CERVICAL DECOMP/DISCECTOMY FUSION N/A 06/12/2023   Procedure: ANTERIOR CERVICAL DISCECTOMY AND FUSION, CERVICAL THREE-CERVICAL FOUR;  Surgeon: Julio Sicks, MD;  Location: [facility] OR;  Service: Neurosurgery;  Laterality: N/A;  3C   BOWEL RESECTION     CERVICAL SPINE SURGERY  06/17/2012   C5-C7 ACDF   CESAREAN SECTION     PARTIAL HYSTERECTOMY     PORT-A-CATH REMOVAL Left  07/28/2015   Procedure: REMOVAL PORT-A-CATH;  Surgeon: Romie Levee, MD;  Location: [facility] ORS;  Service: General;  Laterality: Left;   PORTACATH PLACEMENT Left 12/22/2014   Procedure: INSERTION PORT-A-CATH LEFT SUBCLAVIAN;  Surgeon: Romie Levee, MD;  Location: [facility] ORS;  Service: General;  Laterality: Left;   TONSILLECTOMY      (Not in a hospital admission)  Allergies  Allergen Reactions   Other Rash    *Derma Bond*   Attends Briefs Small Other (See Comments)   Shellfish Allergy Rash    Social History   Tobacco Use   Smoking status: Never    Passive exposure: Never   Smokeless tobacco: Never  Substance Use Topics   Alcohol use: No    Family History  Problem Relation Age of Onset   Cancer Brother    Depression Brother    Cancer Brother    Hypertension Mother    Colon cancer Neg Hx      Objective:   Patient Vitals for the past 8 hrs:  BP Temp Pulse Resp SpO2 Height Weight  06/14/23 1815 122/87 -- (!) 103 11 100 % -- --  06/14/23 1719 -- -- -- -- -- 5\' 3"  (1.6 m) 105.7 kg  06/14/23 1707 126/86 99.3 F (37.4 C) (!) 108 19 98 % -- --   No intake/output data recorded. No intake/output data recorded.  Awake, alert, oriented Speech fluent, appropriate CN grossly intact 5/5 BUE/BLE Wound c/d/I Neck soft, tender SILTx4 FC, MAE x4   Data ReviewCBC:  Lab Results  Component Value Date   WBC 7.1 06/14/2023   RBC 5.00 06/14/2023   BMP:  Lab Results  Component Value Date   GLUCOSE 115 (H) 06/14/2023   GLUCOSE 138 06/18/2017   CHLORIDE 106 06/18/2017   CO2 23 06/14/2023   CO2 25 06/18/2017   BUN 13 06/14/2023   BUN 15 07/10/2021   BUN 14.8 06/18/2017   CREATININE 0.76 06/14/2023   CREATININE 0.8 06/18/2017   CALCIUM 9.2 06/14/2023   CALCIUM 10.1 06/18/2017   Coagulation: No results found for: "INR", "APTT" Cardiac markers: No results found for: "CKMB", "TROPONINT", "MYOGLOBIN" ABGs: No results found for: "PH"  CLINICAL DATA:  Soft tissue infection  suspected. Recent cervical spine surgery   EXAM: CT NECK WITH CONTRAST   TECHNIQUE: Multidetector CT imaging of the neck was performed using the standard protocol following the bolus administration of intravenous contrast.   RADIATION DOSE REDUCTION: This exam was performed according to the departmental dose-optimization program which includes automated exposure control, adjustment of the mA and/or kV according to patient size and/or use of iterative reconstruction technique.   CONTRAST:  75mL OMNIPAQUE IOHEXOL 350 MG/ML SOLN   COMPARISON:  None Available.   FINDINGS: Pharynx and larynx: There is a large amount of retropharyngeal fluid and edema measuring approximately 2.0 cm at the C3 level. There is mild narrowing of the supraglottic airway. The pharyngeal mucosal structures are normal. Normal epiglottis.   Salivary glands: No inflammation, mass, or stone.   Thyroid: Multinodular goiter primarily affecting the right thyroid lobe.   Lymph nodes: None enlarged or abnormal density.   Vascular: Negative.   Limited intracranial: Negative.   Visualized orbits: Negative.   Mastoids and visualized paranasal sinuses: Clear.   Skeleton: C3-6 ACDF. There is small amount of gas in the right neck along the surgical approach. There is moderate right parapharyngeal edema.   Upper chest: Negative.   Other: None.   IMPRESSION: 1. Recent C3-6 ACDF with large amount of retropharyngeal fluid and right parapharyngeal edema. There is mass effect on the larynx and esophagus mild narrowing of the supraglottic airway. These findings may be postsurgical. No specific evidence of infection, though this remains difficult to exclude. 2. Multinodular goiter primarily affecting the right thyroid lobe. This has been previously assessed by ultrasound.    Assessment:   Dysphagia following ACDF C3-4 on 06/12/23  Plan:   -Admit to progressive unit for supportive care -IV Decadron -Pain  mgmt optimization -Bowel regimen -Call w/ questions/concerns.   Patrici Ranks, [facility]

## 2023-06-15 LAB — URINALYSIS, ROUTINE W REFLEX MICROSCOPIC
Bilirubin Urine: NEGATIVE
Glucose, UA: >=500 mg/dL — AB
Ketones, ur: 80 mg/dL — AB
Nitrite: NEGATIVE
Protein, ur: NEGATIVE mg/dL
Specific Gravity, Urine: >1.046 — ABNORMAL HIGH (ref 1.005–1.030)
pH: 5 (ref 5.0–8.0)

## 2023-06-15 LAB — GLUCOSE, CAPILLARY
Glucose-Capillary: 153 mg/dL — ABNORMAL HIGH (ref 70–99)
Glucose-Capillary: 223 mg/dL — ABNORMAL HIGH (ref 70–99)
Glucose-Capillary: 145 mg/dL — ABNORMAL HIGH (ref 70–99)
Glucose-Capillary: 184 mg/dL — ABNORMAL HIGH (ref 70–99)
Glucose-Capillary: 220 mg/dL — ABNORMAL HIGH (ref 70–99)

## 2023-06-15 LAB — BASIC METABOLIC PANEL
Anion gap: 12 (ref 5–15)
BUN: 13 mg/dL (ref 6–20)
CO2: 21 mmol/L — ABNORMAL LOW (ref 22–32)
Calcium: 8.7 mg/dL — ABNORMAL LOW (ref 8.9–10.3)
Chloride: 106 mmol/L (ref 98–111)
Creatinine, Ser: 0.79 mg/dL (ref 0.44–1.00)
GFR, Estimated: >60 mL/min (ref >60)
Glucose, Bld: 122 mg/dL — ABNORMAL HIGH (ref 70–99)
Potassium: 3.8 mmol/L (ref 3.5–5.1)
Sodium: 139 mmol/L (ref 135–145)

## 2023-06-15 LAB — CBC
HCT: 36.1 % (ref 36.0–46.0)
Hemoglobin: 11.4 g/dL — ABNORMAL LOW (ref 12.0–15.0)
MCH: 26.4 pg (ref 26.0–34.0)
MCHC: 31.6 g/dL (ref 30.0–36.0)
MCV: 83.6 fL (ref 80.0–100.0)
Platelets: 220 10*3/uL (ref 150–400)
RBC: 4.32 MIL/uL (ref 3.87–5.11)
RDW: 16.1 % — ABNORMAL HIGH (ref 11.5–15.5)
WBC: 7.3 10*3/uL (ref 4.0–10.5)
nRBC: 0 % (ref 0.0–0.2)

## 2023-06-15 MED ORDER — VENLAFAXINE HCL ER 75 MG PO CP24
150.0000 mg | ORAL_CAPSULE | Freq: Every day | ORAL | Status: DC
  Filled 2023-06-15: qty 2

## 2023-06-15 MED ORDER — VENLAFAXINE HCL 75 MG PO TABS
75.0000 mg | ORAL_TABLET | Freq: Two times a day (BID) | ORAL | Status: DC
  Administered 2023-06-15 – 2023-06-18 (×6): 75 mg via ORAL
  Filled 2023-06-15 (×8): qty 1

## 2023-06-15 MED ORDER — BUPROPION HCL ER (XL) 150 MG PO TB24
450.0000 mg | ORAL_TABLET | Freq: Every day | ORAL | Status: DC
  Filled 2023-06-15: qty 3

## 2023-06-15 MED ORDER — VENLAFAXINE HCL ER 37.5 MG PO CP24
37.5000 mg | ORAL_CAPSULE | Freq: Every day | ORAL | Status: DC
  Filled 2023-06-15: qty 1

## 2023-06-15 MED ORDER — BUPROPION HCL 75 MG PO TABS
150.0000 mg | ORAL_TABLET | Freq: Three times a day (TID) | ORAL | Status: DC
  Administered 2023-06-15 – 2023-06-18 (×10): 150 mg via ORAL
  Filled 2023-06-15 (×14): qty 2

## 2023-06-15 NOTE — Progress Notes (Signed)
   Providing Compassionate, Quality Care - Together  NEUROSURGERY PROGRESS NOTE   S: No issues overnight. Pt just arrived to 3W.  O: EXAM:  BP 112/77 (BP Location: Left Arm)   Pulse 82   Temp 97.8 F (36.6 C) (Oral)   Resp 16   Ht 5\' 3"  (1.6 m)   Wt 105.7 kg   SpO2 94%   BMI 41.28 kg/m   Awake, alert, oriented  Speech fluent, appropriate  CNs grossly intact  5/5 BUE/BLE  SILTx4 Comfortable appearing, NAD Unlabored breathing  ASSESSMENT:  62 y.o. female with dysphagia following ACDF C3-4 on 06/12/23   PLAN: -Cont supportive care -IV Decadron -Pain mgmt optimization -Bowel regimen -HOB elevated -Call w/ questions/concerns.   Jenny [name], [facility]

## 2023-06-15 NOTE — Evaluation (Signed)
Clinical/Bedside Swallow Evaluation Patient Details  Name: Jenny [name] MRN: [id number] Date of Birth: 01-06-1961  Today's Date: 06/15/2023 Time: SLP Start Time (ACUTE ONLY): 1317 SLP Stop Time (ACUTE ONLY): 1349 SLP Time Calculation (min) (ACUTE ONLY): 32 min  Past Medical History:  Past Medical History:  Diagnosis Date   Anemia    Anxiety    Arthritis    Cancer (HCC)    colon cancer    Depression    Diabetes mellitus, type 2 (HCC)    Enlarged thyroid    Fatty liver    Past Surgical History:  Past Surgical History:  Procedure Laterality Date   ANTERIOR CERVICAL DECOMP/DISCECTOMY FUSION N/A 06/12/2023   Procedure: ANTERIOR CERVICAL DISCECTOMY AND FUSION, CERVICAL THREE-CERVICAL FOUR;  Surgeon: Julio Sicks, MD;  Location: [facility];  Service: Neurosurgery;  Laterality: N/A;  3C   BOWEL RESECTION     CERVICAL SPINE SURGERY  06/17/2012   C5-C7 ACDF   CESAREAN SECTION     PARTIAL HYSTERECTOMY     PORT-A-CATH REMOVAL Left 07/28/2015   Procedure: REMOVAL PORT-A-CATH;  Surgeon: Romie Levee, MD;  Location: [facility];  Service: General;  Laterality: Left;   PORTACATH PLACEMENT Left 12/22/2014   Procedure: INSERTION PORT-A-CATH LEFT SUBCLAVIAN;  Surgeon: Romie Levee, MD;  Location: [facility];  Service: General;  Laterality: Left;   TONSILLECTOMY     HPI:  Pt is a 62 y.o. female who presented with dysphagia following ACDF C3-4 on 06/12/23. Pt reported on admission that dysphagia continued to worsen and that she could not swallow any medications. CT soft tissue: large amount of retropharyngeal fluid and right parapharyngeal edema. Mass effect on the larynx and esophagus mild narrowing of the supraglottic airway.  Dexamethasone (decacron) started 6/23.  PMH: ACDF C4-6, T2DM. MBS 03/16/20: Normal swallow function, but endorsed globus and irratation throughout. Biofeedback provided to demonstrate no residual food or liquid in the pharynx or esophagus. Suggest f/u to identify potential for GER  exacerbating symptoms.    Assessment / Plan / Recommendation  Clinical Impression  Pt was seen for bedside swallow evaluation. Pt reported symptoms of pharyngeal dysphagia since ACDF on 6/20. Per the pt, she was only able to swallow thin liquids, puree and oatmeal with effort at the time of her discharge, and was coughing with solids and liquids; she stated that these symptoms worsened since discharge. Pt described a "clicking" sound when she attempted to swallow, coughing and regurgitation. She stated that her symptoms have improved since initiation of the steroid, and that she is no longer coughing but that she still has regurgitation, and a sensation that all of the food "won't go down". Oral mechanism exam was [facility] and her natural dentition was adequate.   She presented with symptoms of pharyngeal dysphagia characterized by multiple swallows, and report of globus sensation which was improved with regurgitation, secondary swallows, and/or liquid washes. Audible swallows noted throughout the evaluation. Pt had already ordered lunch which arrived towards the end of the evaluation. Pt questioned whether she would be able to swallow softer solids and therefore ordered avocado, pinto beans, egg salad, and steamed carrots. These were attempted, but significant difficulty with pharyngeal clearance is suspected and regurgitation was noted across these consistencies. A full liquid diet is recommended at this time to allow pt foods such as cream-based soups, and ice cream. However, SLP suspects that foods such as grits would be challenging for the pt at this time. A modified barium swallow study is recommended to further assess swallow  function, but this cannot be scheduled with radiology for completion today. SLP Visit Diagnosis: Dysphagia, pharyngeal phase (R13.13)    Aspiration Risk  Mild aspiration risk    Diet Recommendation Thin liquid (full liquids)   Liquid Administration via: Cup;Straw Medication  Administration: Crushed with puree (pudding easier than applesauce) Supervision: Patient able to self feed Compensations: Slow rate;Small sips/bites;Follow solids with liquid;Effortful swallow Postural Changes: Seated upright at 90 degrees    Other  Recommendations Oral Care Recommendations: Oral care BID Caregiver Recommendations: Avoid jello, ice cream, thin soups, popsicles    Recommendations for follow up therapy are one component of a multi-disciplinary discharge planning process, led by the attending physician.  Recommendations may be updated based on patient status, additional functional criteria and insurance authorization.  Follow up Recommendations  (TBD)      Assistance Recommended at Discharge    Functional Status Assessment Patient has had a recent decline in their functional status and demonstrates the ability to make significant improvements in function in a reasonable and predictable amount of time.  Frequency and Duration min 2x/week  2 weeks       Prognosis Prognosis for improved oropharyngeal function: Good Barriers to Reach Goals: Severity of deficits      Swallow Study   General Date of Onset: 06/12/23 HPI: Pt is a 62 y.o. female who presented with dysphagia following ACDF C3-4 on 06/12/23. Pt reported on admission that dysphagia continued to worsen and that she could not swallow any medications. CT soft tissue: large amount of retropharyngeal fluid and right parapharyngeal edema. Mass effect on the larynx and esophagus mild narrowing of the supraglottic airway.  Dexamethasone (decacron) started 6/23.  PMH: ACDF C4-6, T2DM. MBS 03/16/20: Normal swallow function, but endorsed globus and irratation throughout. Biofeedback provided to demonstrate no residual food or liquid in the pharynx or esophagus. Suggest f/u to identify potential for GER exacerbating symptoms. Type of Study: Bedside Swallow Evaluation Previous Swallow Assessment: none Diet Prior to this Study:  Regular;Thin liquids (Level 0) Temperature Spikes Noted: No Respiratory Status: Room air History of Recent Intubation: No Behavior/Cognition: Alert;Cooperative;Pleasant mood Oral Cavity Assessment: Within Functional Limits Oral Care Completed by SLP: No Oral Cavity - Dentition: Adequate natural dentition Vision: Functional for self-feeding Self-Feeding Abilities: Able to feed self Patient Positioning: Upright in bed;Postural control adequate for testing Baseline Vocal Quality: Normal Volitional Cough: Strong Volitional Swallow: Able to elicit    Oral/Motor/Sensory Function Overall Oral Motor/Sensory Function: Within functional limits   Ice Chips Ice chips: Impaired Presentation: Spoon Pharyngeal Phase Impairments: Multiple swallows   Thin Liquid Thin Liquid: Impaired Presentation: Cup Pharyngeal  Phase Impairments: Multiple swallows    Nectar Thick Nectar Thick Liquid: Impaired Presentation: Cup Pharyngeal Phase Impairments: Multiple swallows   Honey Thick Honey Thick Liquid: Not tested   Puree Puree: Impaired Presentation: Spoon Pharyngeal Phase Impairments: Multiple swallows   Solid     Solid: Impaired Presentation: Self Fed Pharyngeal Phase Impairments: Multiple swallows     Caeleb Batalla I. Vear Clock, MS, CCC-SLP Neuro Diagnostic Specialist  Acute Rehabilitation Services Office number: [phone]  Scheryl Marten 06/15/2023,2:32 PM

## 2023-06-15 NOTE — ED Notes (Signed)
ED TO INPATIENT HANDOFF REPORT  ED Nurse Name and Phone #: [phone]  S Name/Age/Gender Jenny [name] 62 y.o. female Room/Bed: 030C/030C  Code Status   Code Status: Full Code  Home/SNF/Other Home Patient oriented to: self, place, time, and situation Is this baseline? Yes   Triage Complete: Triage complete  Chief Complaint Dysphagia, unspecified type [R13.10]  Triage Note The pt has neck surgery Thursday anterior neck.  The pt is unable to swallow  she can swallow but the liquid or anything comes right back up  it will not go down all the way   Allergies Allergies  Allergen Reactions   Other Rash    *Derma Bond*   Attends Briefs Small Other (See Comments)   Shellfish Allergy Rash    Level of Care/Admitting Diagnosis ED Disposition     ED Disposition  Admit   Condition  --   Comment  Hospital Area: [facility] [[id number]]  Level of Care: Med-Surg [16]  May admit patient to [facility] or [facility] if equivalent level of care is available:: No  Covid Evaluation: Asymptomatic - no recent exposure (last 10 days) testing not required  Diagnosis: Dysphagia, unspecified type [[id number]]  Admitting Physician: Julio Sicks [phone]  Attending Physician: Julio Sicks [phone]  Bed request comments: 3W  Certification:: I certify this patient will need inpatient services for at least 2 midnights  Estimated Length of Stay: 2          B Medical/Surgery History Past Medical History:  Diagnosis Date   Anemia    Anxiety    Arthritis    Cancer (HCC)    colon cancer    Depression    Diabetes mellitus, type 2 (HCC)    Enlarged thyroid    Fatty liver    Past Surgical History:  Procedure Laterality Date   ANTERIOR CERVICAL DECOMP/DISCECTOMY FUSION N/A 06/12/2023   Procedure: ANTERIOR CERVICAL DISCECTOMY AND FUSION, CERVICAL THREE-CERVICAL FOUR;  Surgeon: Julio Sicks, MD;  Location: [facility];  Service: Neurosurgery;  Laterality: N/A;  3C   BOWEL RESECTION      CERVICAL SPINE SURGERY  06/17/2012   C5-C7 ACDF   CESAREAN SECTION     PARTIAL HYSTERECTOMY     PORT-A-CATH REMOVAL Left 07/28/2015   Procedure: REMOVAL PORT-A-CATH;  Surgeon: Romie Levee, MD;  Location: [facility];  Service: General;  Laterality: Left;   PORTACATH PLACEMENT Left 12/22/2014   Procedure: INSERTION PORT-A-CATH LEFT SUBCLAVIAN;  Surgeon: Romie Levee, MD;  Location: [facility];  Service: General;  Laterality: Left;   TONSILLECTOMY       A IV Location/Drains/Wounds Patient Lines/Drains/Airways Status     Active Line/Drains/Airways     Name Placement date Placement time Site Days   Implanted Port 12/22/14 Left Chest 12/22/14  1121  Chest  3097   Peripheral IV 06/14/23 20 G Anterior;Right Forearm 06/14/23  1822  Forearm  1   Incision - 5 Ports Abdomen 1: Right;Lateral 2: Medial 3: Right;Lower 4: Upper;Umbilicus 5: Left;Upper;Lateral 11/14/14  1202  -- 3135            Intake/Output Last 24 hours  Intake/Output Summary (Last 24 hours) at 06/15/2023 0730 Last data filed at 06/14/2023 2208 Gross per 24 hour  Intake 999 ml  Output --  Net 999 ml    Labs/Imaging Results for orders placed or performed during the hospital encounter of 06/14/23 (from the past 48 hour(s))  Lipase, blood     Status: None   Collection Time:  06/14/23  5:32 PM  Result Value Ref Range   Lipase 26 11 - 51 U/L    Comment: Performed at [facility], [address], [place], [place] [address]  Comprehensive metabolic panel     Status: Abnormal   Collection Time: 06/14/23  5:32 PM  Result Value Ref Range   Sodium 139 135 - 145 mmol/L   Potassium 3.6 3.5 - 5.1 mmol/L   Chloride 103 98 - 111 mmol/L   CO2 23 22 - 32 mmol/L   Glucose, Bld 115 (H) 70 - 99 mg/dL    Comment: Glucose reference range applies only to samples taken after fasting for at least 8 hours.   BUN 13 6 - 20 mg/dL   Creatinine, Ser 2.13 0.44 - 1.00 mg/dL   Calcium 9.2 8.9 - 08.6 mg/dL   Total Protein 7.7 6.5 - 8.1 g/dL    Albumin 3.9 3.5 - 5.0 g/dL   AST 17 15 - 41 U/L   ALT 16 0 - 44 U/L   Alkaline Phosphatase 59 38 - 126 U/L   Total Bilirubin 1.2 0.3 - 1.2 mg/dL   GFR, Estimated >57 >84 mL/min    Comment: (NOTE) Calculated using the CKD-EPI Creatinine Equation (2021)    Anion gap 13 5 - 15    Comment: Performed at [facility], [address] [place], [place] [address]  CBC     Status: Abnormal   Collection Time: 06/14/23  5:32 PM  Result Value Ref Range   WBC 7.1 4.0 - 10.5 K/uL   RBC 5.00 3.87 - 5.11 MIL/uL   Hemoglobin 13.2 12.0 - 15.0 g/dL   HCT 52.8 41.3 - 24.4 %   MCV 83.4 80.0 - 100.0 fL   MCH 26.4 26.0 - 34.0 pg   MCHC 31.7 30.0 - 36.0 g/dL   RDW 01.0 (H) 27.2 - 53.6 %   Platelets 259 150 - 400 K/uL   nRBC 0.0 0.0 - 0.2 %    Comment: Performed at [facility], [address] [place], [place] [address]  CBG monitoring, ED     Status: Abnormal   Collection Time: 06/14/23 10:36 PM  Result Value Ref Range   Glucose-Capillary 100 (H) 70 - 99 mg/dL    Comment: Glucose reference range applies only to samples taken after fasting for at least 8 hours.  CBC     Status: Abnormal   Collection Time: 06/15/23  2:17 AM  Result Value Ref Range   WBC 7.3 4.0 - 10.5 K/uL   RBC 4.32 3.87 - 5.11 MIL/uL   Hemoglobin 11.4 (L) 12.0 - 15.0 g/dL   HCT 47.4 25.9 - 56.3 %   MCV 83.6 80.0 - 100.0 fL   MCH 26.4 26.0 - 34.0 pg   MCHC 31.6 30.0 - 36.0 g/dL   RDW 87.5 (H) 64.3 - 32.9 %   Platelets 220 150 - 400 K/uL   nRBC 0.0 0.0 - 0.2 %    Comment: Performed at [facility], [address] [place], [place] [address]  Basic metabolic panel     Status: Abnormal   Collection Time: 06/15/23  2:17 AM  Result Value Ref Range   Sodium 139 135 - 145 mmol/L   Potassium 3.8 3.5 - 5.1 mmol/L   Chloride 106 98 - 111 mmol/L   CO2 21 (L) 22 - 32 mmol/L   Glucose, Bld 122 (H) 70 - 99 mg/dL    Comment: Glucose  reference range applies only to samples taken after fasting for at least 8 hours.   BUN  13 6 - 20 mg/dL   Creatinine, Ser 1.61 0.44 - 1.00 mg/dL   Calcium 8.7 (L) 8.9 - 10.3 mg/dL   GFR, Estimated >09 >60 mL/min    Comment: (NOTE) Calculated using the CKD-EPI Creatinine Equation (2021)    Anion gap 12 5 - 15    Comment: Performed at [facility], [address], [place], [place] [address]  Urinalysis, Routine w reflex microscopic -Urine, Clean Catch     Status: Abnormal   Collection Time: 06/15/23  2:21 AM  Result Value Ref Range   Color, Urine YELLOW YELLOW   APPearance CLEAR CLEAR   Specific Gravity, Urine >1.046 (H) 1.005 - 1.030   pH 5.0 5.0 - 8.0   Glucose, UA >=500 (A) NEGATIVE mg/dL   Hgb urine dipstick SMALL (A) NEGATIVE   Bilirubin Urine NEGATIVE NEGATIVE   Ketones, ur 80 (A) NEGATIVE mg/dL   Protein, ur NEGATIVE NEGATIVE mg/dL   Nitrite NEGATIVE NEGATIVE   Leukocytes,Ua TRACE (A) NEGATIVE   RBC / HPF 0-5 0 - 5 RBC/hpf   WBC, UA 0-5 0 - 5 WBC/hpf   Bacteria, UA RARE (A) NONE SEEN   Squamous Epithelial / HPF 0-5 0 - 5 /HPF   Mucus PRESENT     Comment: Performed at [facility], [address], [place], [place] [address]   CT Soft Tissue Neck W Contrast  Result Date: 06/14/2023 CLINICAL DATA:  Soft tissue infection suspected. Recent cervical spine surgery EXAM: CT NECK WITH CONTRAST TECHNIQUE: Multidetector CT imaging of the neck was performed using the standard protocol following the bolus administration of intravenous contrast. RADIATION DOSE REDUCTION: This exam was performed according to the departmental dose-optimization program which includes automated exposure control, adjustment of the mA and/or kV according to patient size and/or use of iterative reconstruction technique. CONTRAST:  75mL OMNIPAQUE IOHEXOL 350 MG/ML SOLN COMPARISON:  None Available. FINDINGS: Pharynx and larynx: There is a large amount of retropharyngeal fluid and edema measuring approximately 2.0 cm at the C3 level. There is mild narrowing of the supraglottic airway. The  pharyngeal mucosal structures are normal. Normal epiglottis. Salivary glands: No inflammation, mass, or stone. Thyroid: Multinodular goiter primarily affecting the right thyroid lobe. Lymph nodes: None enlarged or abnormal density. Vascular: Negative. Limited intracranial: Negative. Visualized orbits: Negative. Mastoids and visualized paranasal sinuses: Clear. Skeleton: C3-6 ACDF. There is small amount of gas in the right neck along the surgical approach. There is moderate right parapharyngeal edema. Upper chest: Negative. Other: None. IMPRESSION: 1. Recent C3-6 ACDF with large amount of retropharyngeal fluid and right parapharyngeal edema. There is mass effect on the larynx and esophagus mild narrowing of the supraglottic airway. These findings may be postsurgical. No specific evidence of infection, though this remains difficult to exclude. 2. Multinodular goiter primarily affecting the right thyroid lobe. This has been previously assessed by ultrasound. Electronically Signed   By: Deatra Robinson M.D.   On: 06/14/2023 19:59    Pending Labs Unresulted Labs (From admission, onward)    None       Vitals/Pain Today's Vitals   06/14/23 2315 06/15/23 0000 06/15/23 0445 06/15/23 0700  BP: 101/75 108/71 109/65   Pulse: 93 93 79   Resp: 16 (!) 0 15   Temp:    98.4 F (36.9 C)  SpO2: 98% 96% 97%   Weight:      Height:  PainSc:        Isolation Precautions No active isolations  Medications Medications  dexamethasone (DECADRON) injection 4 mg (4 mg Intravenous Given 06/15/23 0630)  atorvastatin (LIPITOR) tablet 20 mg (has no administration in time range)  buPROPion (WELLBUTRIN XL) 24 hr tablet 150 mg (has no administration in time range)  buPROPion (WELLBUTRIN XL) 24 hr tablet 300 mg (has no administration in time range)  traZODone (DESYREL) tablet 50 mg (has no administration in time range)  venlafaxine XR (EFFEXOR-XR) 24 hr capsule 37.5 mg (has no administration in time range)   empagliflozin (JARDIANCE) tablet 10 mg (has no administration in time range)  metFORMIN (GLUCOPHAGE-XR) 24 hr tablet 500 mg (has no administration in time range)  cyclobenzaprine (FLEXERIL) tablet 10 mg (has no administration in time range)  gabapentin (NEURONTIN) capsule 300 mg (300 mg Oral Not Given 06/14/23 2227)  loratadine (CLARITIN) tablet 10 mg (has no administration in time range)  sodium chloride flush (NS) 0.9 % injection 3 mL (3 mLs Intravenous Given 06/14/23 2245)  sodium chloride flush (NS) 0.9 % injection 3 mL (has no administration in time range)  0.9 %  sodium chloride infusion (250 mLs Intravenous New Bag/Given 06/14/23 2245)  acetaminophen (TYLENOL) tablet 650 mg (has no administration in time range)    Or  acetaminophen (TYLENOL) suppository 650 mg (has no administration in time range)  ondansetron (ZOFRAN) tablet 4 mg (has no administration in time range)    Or  ondansetron (ZOFRAN) injection 4 mg (has no administration in time range)  menthol-cetylpyridinium (CEPACOL) lozenge 3 mg (has no administration in time range)    Or  phenol (CHLORASEPTIC) mouth spray 1 spray (has no administration in time range)  0.9 % NaCl with KCl 20 mEq/ L  infusion ( Intravenous New Bag/Given 06/14/23 2255)  ketorolac (TORADOL) 15 MG/ML injection 15 mg (15 mg Intravenous Given 06/15/23 0628)  HYDROcodone-acetaminophen (NORCO/VICODIN) 5-325 MG per tablet 1 tablet (has no administration in time range)  HYDROcodone-acetaminophen (NORCO/VICODIN) 5-325 MG per tablet 2 tablet (has no administration in time range)  HYDROmorphone (DILAUDID) injection 0.5 mg (has no administration in time range)  docusate sodium (COLACE) capsule 100 mg (100 mg Oral Not Given 06/14/23 2228)  polyethylene glycol (MIRALAX / GLYCOLAX) packet 17 g (has no administration in time range)  sodium phosphate (FLEET) 7-19 GM/118ML enema 1 enema (has no administration in time range)  insulin aspart (novoLOG) injection 0-15 Units (has  no administration in time range)  insulin aspart (novoLOG) injection 0-5 Units ( Subcutaneous Not Given 06/14/23 2245)  sodium chloride 0.9 % bolus 1,000 mL (0 mLs Intravenous Stopped 06/14/23 2208)  HYDROmorphone (DILAUDID) injection 0.5 mg (0.5 mg Intravenous Given 06/14/23 1909)  ondansetron (ZOFRAN) injection 4 mg (4 mg Intravenous Given 06/14/23 1909)  iohexol (OMNIPAQUE) 350 MG/ML injection 75 mL (75 mLs Intravenous Contrast Given 06/14/23 1930)    Mobility walks with device     Focused Assessments Airway is patent Incision looks unremarkable   R Recommendations: See Admitting Provider Note  Report given to:   Additional Notes:

## 2023-06-16 ENCOUNTER — Inpatient Hospital Stay (HOSPITAL_COMMUNITY): Payer: Medicare PPO

## 2023-06-16 LAB — GLUCOSE, CAPILLARY
Glucose-Capillary: 143 mg/dL — ABNORMAL HIGH (ref 70–99)
Glucose-Capillary: 166 mg/dL — ABNORMAL HIGH (ref 70–99)
Glucose-Capillary: 158 mg/dL — ABNORMAL HIGH (ref 70–99)
Glucose-Capillary: 187 mg/dL — ABNORMAL HIGH (ref 70–99)

## 2023-06-16 MED ORDER — DOCUSATE SODIUM 50 MG/5ML PO LIQD
100.0000 mg | Freq: Two times a day (BID) | ORAL | Status: DC
  Administered 2023-06-17 – 2023-06-18 (×3): 100 mg via ORAL
  Filled 2023-06-16 (×3): qty 10

## 2023-06-16 NOTE — TOC CM/SW Note (Signed)
Transition of Care [facility]) - Inpatient Brief Assessment   Patient Details  Name: Jenny [name] MRN: [id number] Date of Birth: Feb 21, 1961  [facility] of [facility]) CM/SW Contact:    Kermit Balo, RN Phone Number: 06/16/2023, 2:51 PM   Clinical Narrative: Pt with selling post ACDF. On IV steroids.  Advancing diet.  Pt has transportation home when medically ready.    Transition of Care Asessment: Insurance and Status: Insurance coverage has been reviewed Patient has primary care physician: Yes     Prior/Current Home Services: No current home services Social Determinants of Health Reivew: SDOH reviewed no interventions necessary Readmission risk has been reviewed: Yes Transition of care needs: no transition of care needs at this time

## 2023-06-16 NOTE — Progress Notes (Signed)
Patient readmitted with significant dysphagia after surgery.  She had similar difficulty after prior anterior cervical surgery as well.  She is managing her secretions well.  She is not in any distress.  Her modified barium swallow looked pretty good.  She is afebrile.  She does not appear to be in any distress.  She is having no airway issues.  She is awake and alert.  She is oriented and appropriate.  Cranial nerve function intact.  Motor and sensory function intact.  Wound healing well.  Neck reasonably soft aside from her goiter.  Patient with symptomatic dysphagia which seems to be slowly improving.  Patient cleared for diet per speech therapy.  Will see how she does over the next day.  Likely discharge tomorrow.

## 2023-06-16 NOTE — Progress Notes (Signed)
Mobility Specialist Progress Note   06/16/23 1609  Mobility  Activity Ambulated with assistance in hallway  Level of Assistance Contact guard assist, steadying assist  Assistive Device Front wheel walker  Distance Ambulated (ft) 230 ft  Activity Response Tolerated well  Mobility Referral Yes  $Mobility charge 1 Mobility  Mobility Specialist Start Time (ACUTE ONLY) 1533  Mobility Specialist Stop Time (ACUTE ONLY) 1600  Mobility Specialist Time Calculation (min) (ACUTE ONLY) 27 min   Patient received standign at EOB agreeable to participate in mobility. Ambulated in hallway w/ CGA steady gait. Returned to room w/ slight c/o pain in neck. Was left sitting EOB with call bell in reach and RN notified about pain.    Frederico Hamman Mobility Specialist Please contact via SecureChat or  Rehab office at [phone]

## 2023-06-16 NOTE — Evaluation (Signed)
Occupational Therapy Evaluation/Discharge Patient Details Name: Jenny [name] MRN: [id number] DOB: [birth year] Today's Date: 06/16/2023   History of Present Illness Pt is a 62 y/o female presenting with progressive dysphagia s/p admission 6/20-6/21 for ACDF C4-6. PMH: anemia, colon cancer, DM2   Clinical Impression   Prior to initial surgery, pt was completely independent in all daily tasks without AD. Since ACDF on 6/20, pt has been mobilizing around the home without AD and has intermittent light assist for LB dressing/IADLs as needed. Pt able to demo hallway mobility with IV pole for support; educated to use RW at home if feeling unsteady or for longer distances. Pt able to recall all cervical precautions, manage soft collar and is aware of all AE for LB ADLs. Pt denies any concerns managing daily routine at home w/ multiple friends/family to assist as needed. OT to sign off acutely. Encourage mobility specialist's service during admission to maximize OOB activity/endurance.      Recommendations for follow up therapy are one component of a multi-disciplinary discharge planning process, led by the attending physician.  Recommendations may be updated based on patient status, additional functional criteria and insurance authorization.   Assistance Recommended at Discharge Set up Supervision/Assistance  Patient can return home with the following A little help with bathing/dressing/bathroom;Assistance with cooking/housework;Assist for transportation    Functional Status Assessment  Patient has had a recent decline in their functional status and demonstrates the ability to make significant improvements in function in a reasonable and predictable amount of time.  Equipment Recommendations  None recommended by OT    Recommendations for Other Services       Precautions / Restrictions Precautions Precautions: Fall;Cervical Precaution Booklet Issued: Yes (comment) Required Braces or Orthoses:  Cervical Brace Cervical Brace: Soft collar;For comfort Restrictions Weight Bearing Restrictions: No      Mobility Bed Mobility Overal bed mobility: Modified Independent Bed Mobility: Supine to Sit                Transfers Overall transfer level: Needs assistance Equipment used: None Transfers: Sit to/from Stand Sit to Stand: Supervision           General transfer comment: no AD to stand. some reaching out for support with mobility (used IV pole)      Balance Overall balance assessment: Needs assistance Sitting-balance support: Feet supported Sitting balance-Leahy Scale: Fair     Standing balance support: During functional activity, Single extremity supported Standing balance-Leahy Scale: Fair                             ADL either performed or assessed with clinical judgement   ADL Overall ADL's : Needs assistance/impaired Eating/Feeding: Independent;Sitting   Grooming: Modified independent;Standing   Upper Body Bathing: Modified independent   Lower Body Bathing: Supervison/ safety;Sit to/from stand   Upper Body Dressing : Modified independent Upper Body Dressing Details (indicate cue type and reason): donning cervical collar without issue Lower Body Dressing: Supervision/safety;With adaptive equipment;Sitting/lateral leans;Sit to/from stand Lower Body Dressing Details (indicate cue type and reason): with effort, able to cross LE to reach socks - reports having a reacher at home and aware of sock aid/shoehorn use from prior admission. reports typically wearing slip on sketchers without laces Toilet Transfer: Supervision/safety;Ambulation Toilet Transfer Details (indicate cue type and reason): used RW with nursing prior to OT entry Toileting- Clothing Manipulation and Hygiene: Supervision/safety;Sitting/lateral lean;Sit to/from stand       Functional mobility during ADLs:  Supervision/safety (IV pole) General ADL Comments: denies any concerns  with ADLs, daughter present and pt reports multiple members of support system to help as needed. Cued to use RW as needed at home if feeling unsteady     Vision Baseline Vision/History: 0 No visual deficits Ability to See in Adequate Light: 0 Adequate Patient Visual Report: No change from baseline Vision Assessment?: No apparent visual deficits     Perception     Praxis      Pertinent Vitals/Pain Pain Assessment Pain Assessment: Faces Faces Pain Scale: Hurts a little bit Pain Location: neck Pain Descriptors / Indicators: Sore Pain Intervention(s): Monitored during session     Hand Dominance Right   Extremity/Trunk Assessment Upper Extremity Assessment Upper Extremity Assessment: RUE deficits/detail;LUE deficits/detail RUE Deficits / Details: reports some improvements in sensation and B shoulder pain post op   Lower Extremity Assessment Lower Extremity Assessment: Overall WFL for tasks assessed   Cervical / Trunk Assessment Cervical / Trunk Assessment: Neck Surgery   Communication Communication Communication: No difficulties   Cognition Arousal/Alertness: Awake/alert Behavior During Therapy: Flat affect Overall Cognitive Status: Within Functional Limits for tasks assessed                                 General Comments: good recall of precautions, follows directions, shows insight into deficits     General Comments  Daughter at bedside    Exercises     Shoulder Instructions      Home Living Family/patient expects to be discharged to:: Private residence Living Arrangements: Spouse/significant other;Children Available Help at Discharge: Family;Friend(s);Available 24 hours/day Type of Home: House Home Access: Stairs to enter;Level entry Entrance Stairs-Number of Steps: 1   Home Layout: Two level Alternate Level Stairs-Number of Steps: 14 Alternate Level Stairs-Rails: Left Bathroom Shower/Tub: Tub/shower unit;Walk-in shower   Bathroom  Toilet: Standard     Home Equipment: Agricultural consultant (2 wheels)   Additional Comments: reports looking into getting a shower chair      Prior Functioning/Environment Prior Level of Function : Independent/Modified Independent             Mobility Comments: reports able to walk short distances without AD, will use shopping cart for stability if out in store ADLs Comments: Indep with ADLs/IADLs prior. since sx, has had some help with LB dressing and IADLs as needed        OT Problem List: Decreased activity tolerance;Impaired balance (sitting and/or standing);Pain      OT Treatment/Interventions:      OT Goals(Current goals can be found in the care plan section) Acute Rehab OT Goals Patient Stated Goal: resolve swallowing issues OT Goal Formulation: All assessment and education complete, DC therapy  OT Frequency:      Co-evaluation              AM-PAC OT "6 Clicks" Daily Activity     Outcome Measure Help from another person eating meals?: None Help from another person taking care of personal grooming?: None Help from another person toileting, which includes using toliet, bedpan, or urinal?: A Little Help from another person bathing (including washing, rinsing, drying)?: A Little Help from another person to put on and taking off regular upper body clothing?: None Help from another person to put on and taking off regular lower body clothing?: A Little 6 Click Score: 21   End of Session Equipment Utilized During Treatment: Cervical collar Nurse Communication: Mobility  status  Activity Tolerance: Patient tolerated treatment well Patient left: in bed;with call bell/phone within reach;with family/visitor present  OT Visit Diagnosis: Unsteadiness on feet (R26.81)                Time: 0981-1914 OT Time Calculation (min): 17 min Charges:  OT General Charges $OT Visit: 1 Visit OT Evaluation $OT Eval Low Complexity: 1 Low  Bradd Canary, OTR/L Acute Rehab Services Office:  [phone]   Lorre Munroe 06/16/2023, 9:03 AM

## 2023-06-16 NOTE — Evaluation (Signed)
Physical Therapy Evaluation Patient Details Name: Jenny [name] MRN: [id number] DOB: 18-Aug-1961 Today's Date: 06/16/2023  History of Present Illness  Pt is a 62 y/o female presenting with progressive dysphagia s/p admission 6/20-6/21 for ACDF C4-6. PMH: anemia, colon cancer, DM2.   Clinical Impression  Jenny [name] is 62 y.o. female admitted with above HPI and diagnosis. Patient is currently limited by functional impairments below (see PT problem list). Patient lives with spouse and grandchildren is independent at baseline. Currently she requires min guard/supervision for gait with RW and demonstrates slow cadence with wide BOS due to balance deficits. Patient will benefit from continued skilled PT interventions to address impairments and progress independence with mobility. Acute PT will follow and progress as able.        Recommendations for follow up therapy are one component of a multi-disciplinary discharge planning process, led by the attending physician.  Recommendations may be updated based on patient status, additional functional criteria and insurance authorization.  Follow Up Recommendations       Assistance Recommended at Discharge Intermittent Supervision/Assistance  Patient can return home with the following  A little help with walking and/or transfers;A little help with bathing/dressing/bathroom;Assistance with cooking/housework;Assist for transportation;Help with stairs or ramp for entrance    Equipment Recommendations None recommended by PT  Recommendations for Other Services       Functional Status Assessment Patient has had a recent decline in their functional status and demonstrates the ability to make significant improvements in function in a reasonable and predictable amount of time.     Precautions / Restrictions Precautions Precautions: Fall;Cervical Precaution Booklet Issued: Yes (comment) Required Braces or Orthoses: Cervical Brace Cervical Brace: Soft  collar;For comfort Restrictions Weight Bearing Restrictions: No      Mobility  Bed Mobility Overal bed mobility: Modified Independent Bed Mobility: Supine to Sit, Sit to Supine     Supine to sit: HOB elevated, Modified independent (Device/Increase time) Sit to supine: Modified independent (Device/Increase time), HOB elevated   General bed mobility comments: log roll compelted.    Transfers Overall transfer level: Needs assistance Equipment used: Rolling walker (2 wheels) Transfers: Sit to/from Stand Sit to Stand: Supervision           General transfer comment: pt with power up from EOB to RW, sup for safety, no assist or cues needed    Ambulation/Gait Ambulation/Gait assistance: Min guard Gait Distance (Feet): 180 Feet Assistive device: Rolling walker (2 wheels) Gait Pattern/deviations: Decreased stride length, Step-through pattern, Wide base of support Gait velocity: decr     General Gait Details: pt steady with slow cautious pace during gait, no LOB noted and maintained safe proximity to RW.  Stairs            Wheelchair Mobility    Modified Rankin (Stroke Patients Only)       Balance Overall balance assessment: Needs assistance Sitting-balance support: Feet supported Sitting balance-Leahy Scale: Fair     Standing balance support: During functional activity, Single extremity supported Standing balance-Leahy Scale: Fair                               Pertinent Vitals/Pain Pain Assessment Pain Assessment: Faces Faces Pain Scale: Hurts a little bit Pain Location: neck Pain Descriptors / Indicators: Sore Pain Intervention(s): Limited activity within patient's tolerance, Monitored during session, Ice applied, Repositioned    Home Living Family/patient expects to be discharged to:: Private residence Living Arrangements: Spouse/significant  other;Children Available Help at Discharge: Family;Friend(s);Available 24 hours/day Type of  Home: House Home Access: Stairs to enter;Level entry   Entrance Stairs-Number of Steps: 1 Alternate Level Stairs-Number of Steps: 14 Home Layout: Two level Home Equipment: Agricultural consultant (2 wheels) Additional Comments: reports looking into getting a shower chair    Prior Function Prior Level of Function : Independent/Modified Independent             Mobility Comments: reports able to walk short distances without AD, will use shopping cart for stability if out in store ADLs Comments: Indep with ADLs/IADLs prior. since sx, has had some help with LB dressing and IADLs as needed     Hand Dominance   Dominant Hand: Right    Extremity/Trunk Assessment   Upper Extremity Assessment Upper Extremity Assessment: Defer to OT evaluation RUE Deficits / Details: reports some improvements in sensation and B shoulder pain post op    Lower Extremity Assessment Lower Extremity Assessment: Overall WFL for tasks assessed (some tingling in bil feet)    Cervical / Trunk Assessment Cervical / Trunk Assessment: Neck Surgery  Communication   Communication: No difficulties  Cognition Arousal/Alertness: Awake/alert Behavior During Therapy: WFL for tasks assessed/performed Overall Cognitive Status: Within Functional Limits for tasks assessed                                 General Comments: good recall of precautions, follows directions, shows insight into deficits        General Comments General comments (skin integrity, edema, etc.): daughter present at bedside    Exercises     Assessment/Plan    PT Assessment Patient needs continued PT services  PT Problem List Decreased strength;Decreased activity tolerance;Decreased balance;Decreased mobility;Decreased knowledge of use of DME;Decreased safety awareness;Decreased knowledge of precautions;Obesity;Impaired sensation       PT Treatment Interventions DME instruction;Gait training;Stair training;Functional mobility  training;Therapeutic activities;Therapeutic exercise;Balance training;Neuromuscular re-education;Cognitive remediation;Patient/family education    PT Goals (Current goals can be found in the Care Plan section)  Acute Rehab PT Goals Patient Stated Goal: get better, regain independence PT Goal Formulation: With patient Time For Goal Achievement: 06/23/23 Potential to Achieve Goals: Good    Frequency Min 3X/week     Co-evaluation               AM-PAC PT "6 Clicks" Mobility  Outcome Measure Help needed turning from your back to your side while in a flat bed without using bedrails?: A Little Help needed moving from lying on your back to sitting on the side of a flat bed without using bedrails?: A Little Help needed moving to and from a bed to a chair (including a wheelchair)?: A Little Help needed standing up from a chair using your arms (e.g., wheelchair or bedside chair)?: A Little Help needed to walk in hospital room?: A Little Help needed climbing 3-5 steps with a railing? : A Little 6 Click Score: 18    End of Session Equipment Utilized During Treatment: Cervical collar;Gait belt Activity Tolerance: Patient tolerated treatment well Patient left: in bed;with call bell/phone within reach;with bed alarm set;with family/visitor present Nurse Communication: Mobility status PT Visit Diagnosis: Unsteadiness on feet (R26.81);Muscle weakness (generalized) (M62.81)    Time: 7425-9563 PT Time Calculation (min) (ACUTE ONLY): 23 min   Charges:   PT Evaluation $PT Eval Low Complexity: 1 Low PT Treatments $Gait Training: 8-22 mins        Fleet Contras  Francella Solian, DPT Acute Rehabilitation Services Office [phone]  06/16/23 1:10 PM

## 2023-06-16 NOTE — Procedures (Signed)
Modified Barium Swallow Study  Patient Details  Name: Jenny [name] MRN: [id number] Date of Birth: Jul 17, 1961  Today's Date: 06/16/2023  Modified Barium Swallow completed.  Full report located under Chart Review in the Imaging Section.  History of Present Illness Pt is a 62 y.o. female who presented with dysphagia following ACDF C3-4 on 06/12/23. Pt reported on admission that dysphagia continued to worsen and that she could not swallow any medications. CT soft tissue: large amount of retropharyngeal fluid and right parapharyngeal edema. Mass effect on the larynx and esophagus mild narrowing of the supraglottic airway.  Dexamethasone (decacron) started 6/23.  PMH: ACDF C4-6, T2DM. MBS 03/16/20: Normal swallow function, but endorsed globus and irratation throughout. Biofeedback provided to demonstrate no residual food or liquid in the pharynx or esophagus. Suggest f/u to identify potential for GER exacerbating symptoms.   Clinical Impression Pt presents with a moderate pharyngoesophageal dysphagia c/b reduced pharyngeal stripping wave, minimal epiglottic deflection presumed 2/2 hypopharyngeal edema, incomplete laryngeal closure, reduced UES opening with suspected CP bar (see images below), and esophageal retention and backflow.  These deficits resulted in sensed, trace penetration of thin liquid which pt was able to fully clear with spontaneous cough and reswallow.  With nectar thick liquid, puree, and solid textures there was no penetration.  Pt took very small bite of regular solid.  Pt appeared more comfortable with nectar thick liquids and confirmed same.  Pt exhibits belching/hiccuping coughing when bolus passes below CP bar at around C5.  There is partial backflow of bolus to the pyriform sinuses. There is additional further retention of contrast at mid esophagus, and lower esophagus. Pt may benefit from further assessemnt of esophageal dysphagia/dysmotility if indicated when medically appropriate.  It is not clear if esophageal dysphagia symptoms observed today are new post surgery or have been exacerbated by swelling/post-surgical changes.  Hopefully, as edema improves so will swallow function.  Pt endoreses improvement since returning to hospital and receiving decadron.   Pt appears safe to consume a regular texture diet with thin liquids given that she is able to clear her airway and is otherwise healthy and is at low risk for developing pneumonia should she experience trace aspiration.  However, given pts discomfort with these textures, recommend modified diet at this time.  Pt may have ice chips or water as desired between meals.  Pt may also have trials of snacks from floor stock of more advanced texture solids if desired.  Recommend continuing regular, thorough oral care.  SLP will continue to follow for readiness for diet advancement.   Recommend puree diet with nectar thick liquids.  Possible CP bar    During swallow     With passive backflow of bolus   Factors that may increase risk of adverse event in presence of aspiration Rubye Oaks & [name] 2021):    Swallow Evaluation Recommendations Recommendations: PO diet PO Diet Recommendation: Dysphagia 1 (Pureed);Mildly thick liquids (Level 2, nectar thick) Liquid Administration via: Cup Medication Administration: Crushed with puree Supervision: Patient able to self-feed Swallowing strategies  : Slow rate;Small bites/sips Postural changes: Position pt fully upright for meals;Stay upright 30-60 min after meals Oral care recommendations: Oral care BID (2x/day) Recommended consults: Consider esophageal assessment      Kerrie Pleasure, MA, CCC-SLP Acute Rehabilitation Services Office: [phone] 06/16/2023,10:20 AM

## 2023-06-17 LAB — GLUCOSE, CAPILLARY
Glucose-Capillary: 157 mg/dL — ABNORMAL HIGH (ref 70–99)
Glucose-Capillary: 166 mg/dL — ABNORMAL HIGH (ref 70–99)
Glucose-Capillary: 159 mg/dL — ABNORMAL HIGH (ref 70–99)

## 2023-06-17 MED ORDER — METFORMIN HCL 500 MG PO TABS
500.0000 mg | ORAL_TABLET | Freq: Two times a day (BID) | ORAL | Status: DC
  Administered 2023-06-17 – 2023-06-18 (×2): 500 mg via ORAL
  Filled 2023-06-17 (×2): qty 1

## 2023-06-17 NOTE — Care Management Important Message (Signed)
Important Message  Patient Details  Name: PUANANI GENE MRN: [id number] Date of Birth: 1961-08-15   Medicare Important Message Given:  Yes     Dorena Bodo 06/17/2023, 2:59 PM

## 2023-06-17 NOTE — Progress Notes (Signed)
Providing Compassionate, Quality Care - Together   Subjective: Patient reports continued difficulty with swallowing solid foods. She underwent a modifies barium swallow with speech therapy yesterday and has questions regarding the report.  Objective: Vital signs in last 24 hours: Temp:  [98.1 F (36.7 C)-98.8 F (37.1 C)] 98.1 F (36.7 C) (06/25 0839) Pulse Rate:  [71-87] 79 (06/25 0839) Resp:  [18-20] 20 (06/25 0839) BP: (146-186)/(84-99) 156/99 (06/25 0839) SpO2:  [90 %-99 %] 95 % (06/25 0839)  Intake/Output from previous day: 06/24 0701 - 06/25 0700 In: 3468.3 [P.O.:240; I.V.:3228.3] Out: 1 [Urine:1] Intake/Output this shift: No intake/output data recorded.  Alert and oriented Speech clear MAE, S/S intact Incision is well-approximated and without any complicating features.  Lab Results: Recent Labs    06/14/23 1732 06/15/23 0217  WBC 7.1 7.3  HGB 13.2 11.4*  HCT 41.7 36.1  PLT 259 220   BMET Recent Labs    06/14/23 1732 06/15/23 0217  NA 139 139  K 3.6 3.8  CL 103 106  CO2 23 21*  GLUCOSE 115* 122*  BUN 13 13  CREATININE 0.76 0.79  CALCIUM 9.2 8.7*    Studies/Results: DG Swallowing Func-Speech Pathology  Result Date: 06/16/2023 Table formatting from the original result was not included. Images from the original result were not included. Modified Barium Swallow Study Patient Details Name: Jenny [name] MRN: [id number] Date of Birth: Feb 14, 1961 Today's Date: 06/16/2023 HPI/PMH: HPI: Pt is a 62 y.o. female who presented with dysphagia following ACDF C3-4 on 06/12/23. Pt reported on admission that dysphagia continued to worsen and that she could not swallow any medications. CT soft tissue: large amount of retropharyngeal fluid and right parapharyngeal edema. Mass effect on the larynx and esophagus mild narrowing of the supraglottic airway.  Dexamethasone (decacron) started 6/23.  PMH: ACDF C4-6, T2DM. MBS 03/16/20: Normal swallow function, but endorsed globus  and irratation throughout. Biofeedback provided to demonstrate no residual food or liquid in the pharynx or esophagus. Suggest f/u to identify potential for GER exacerbating symptoms. Clinical Impression: Pt presents with a moderate pharyngoesophageal dysphagia c/b reduced pharyngeal stripping wave, minimal epiglottic deflection presumed 2/2 hypopharyngeal edema, incomplete laryngeal closure, reduced UES opening with suspected CP bar (see images below), and esophageal retention and backflow.  These deficits resulted in sensed, trace penetration of thin liquid which pt was able to fully clear with spontaneous cough and reswallow.  With nectar thick liquid, puree, and solid textures there was no penetration.  Pt took very small bite of regular solid.  Pt appeared more comfortable with nectar thick liquids and confirmed same.  Pt exhibits belching/hiccuping coughing when bolus passes below CP bar at around C5.  There is partial backflow of bolus to the pyriform sinuses. There is additional further retention of contrast at mid esophagus, and lower esophagus. Pt may benefit from further assessemnt of esophageal dysphagia/dysmotility if indicated when medically appropriate. It is not clear if esophageal dysphagia symptoms observed today are new post surgery or have been exacerbated by swelling/post-surgical changes.  Hopefully, as edema improves so will swallow function.  Pt endoreses improvement since returning to hospital and receiving decadron. Pt appears safe to consume a regular texture diet with thin liquids given that she is able to clear her airway and is otherwise healthy and is at low risk for developing pneumonia should she experience trace aspiration.  However, given pts discomfort with these textures, recommend modified diet at this time.  Pt may have ice chips or water as desired  between meals.  Pt may also have trials of snacks from floor stock of more advanced texture solids if desired.  Recommend  continuing regular, thorough oral care.  SLP will continue to follow for readiness for diet advancement. Recommend puree diet with nectar thick liquids. Possible CP bar   During swallow  With passive backflow of bolus Factors that may increase risk of adverse event in presence of aspiration Jenny [name] & [name] 2021): No data recorded Recommendations/Plan: Swallowing Evaluation Recommendations Swallowing Evaluation Recommendations Recommendations: PO diet PO Diet Recommendation: Dysphagia 1 (Pureed); Mildly thick liquids (Level 2, nectar thick) Liquid Administration via: Cup Medication Administration: Crushed with puree Supervision: Patient able to self-feed Swallowing strategies  : Slow rate; Small bites/sips Postural changes: Position pt fully upright for meals; Stay upright 30-60 min after meals Oral care recommendations: Oral care BID (2x/day) Recommended consults: Consider esophageal assessment Caregiver Recommendations: Avoid jello, ice cream, thin soups, popsicles Treatment Plan Treatment Plan Treatment recommendations: Therapy as outlined in treatment plan below Follow-up recommendations: -- (Continue ST at next level of care at present) Functional status assessment: Patient has had a recent decline in their functional status and demonstrates the ability to make significant improvements in function in a reasonable and predictable amount of time. Treatment frequency: Min 2x/week Treatment duration: 2 weeks Interventions: Diet toleration management by SLP; Patient/family education; Trials of upgraded texture/liquids Recommendations Recommendations for follow up therapy are one component of a multi-disciplinary discharge planning process, led by the attending physician.  Recommendations may be updated based on patient status, additional functional criteria and insurance authorization. Assessment: Orofacial Exam: Orofacial Exam Oral Cavity: Oral Hygiene: WFL Oral Cavity - Dentition: Adequate natural dentition  Orofacial Anatomy: WFL Oral Motor/Sensory Function: -- (See clinical swallow evaluation 6/23) Anatomy: No data recorded Boluses Administered: Boluses Administered Boluses Administered: Thin liquids (Level 0); Mildly thick liquids (Level 2, nectar thick); Puree; Solid  Oral Impairment Domain: Oral Impairment Domain Lip Closure: No labial escape Tongue control during bolus hold: Cohesive bolus between tongue to palatal seal Bolus preparation/mastication: Timely and efficient chewing and mashing Bolus transport/lingual motion: Brisk tongue motion Oral residue: Complete oral clearance Location of oral residue : N/A Initiation of pharyngeal swallow : Posterior angle of the ramus  Pharyngeal Impairment Domain: Pharyngeal Impairment Domain Soft palate elevation: No bolus between soft palate (SP)/pharyngeal wall (PW) Laryngeal elevation: Complete superior movement of thyroid cartilage with complete approximation of arytenoids to epiglottic petiole Anterior hyoid excursion: Complete anterior movement Epiglottic movement: No inversion Laryngeal vestibule closure: Incomplete, narrow column air/contrast in laryngeal vestibule Pharyngeal stripping wave : Present - diminished Pharyngeal contraction (A/P view only): N/A Pharyngoesophageal segment opening: Partial distention/partial duration, partial obstruction of flow Tongue base retraction: No contrast between tongue base and posterior pharyngeal wall (PPW) Pharyngeal residue: Trace residue within or on pharyngeal structures Location of pharyngeal residue: Valleculae  Esophageal Impairment Domain: Esophageal Impairment Domain Esophageal clearance upright position: Esophageal retention with retrograde flow below pharyngoesophageal segment (PES); Esophageal retention with retrograde flow through the PES Pill: Pill Consistency administered: -- (Not administered) Penetration/Aspiration Scale Score: Penetration/Aspiration Scale Score 1.  Material does not enter airway: Mildly thick  liquids (Level 2, nectar thick); Puree; Solid 2.  Material enters airway, remains ABOVE vocal cords then ejected out: Thin liquids (Level 0) Compensatory Strategies: Compensatory Strategies Compensatory strategies: No   General Information: No data recorded Diet Prior to this Study: Regular; Thin liquids (Level 0)   No data recorded  No data recorded  No data recorded  No data recorded  Behavior/Cognition: Alert; Cooperative; Pleasant mood No data recorded No data recorded No data recorded Volitional Swallow: Able to elicit Exam Limitations: No limitations Goal Planning: Prognosis for improved oropharyngeal function: Good No data recorded No data recorded Patient/Family Stated Goal: none stated Consulted and agree with results and recommendations: Patient Pain: Pain Assessment Pain Assessment: Faces Faces Pain Scale: 2 Pain Location: neck Pain Descriptors / Indicators: Sore Pain Intervention(s): Limited activity within patient's tolerance; Monitored during session; Ice applied; Repositioned End of Session: Start Time:SLP Start Time (ACUTE ONLY): 1317 Stop Time: SLP Stop Time (ACUTE ONLY): 1349 Time Calculation:SLP Time Calculation (min) (ACUTE ONLY): 32 min Charges: SLP Evaluations $ SLP Speech Visit: 1 Visit SLP Evaluations $BSS Swallow: 1 Procedure SLP visit diagnosis: SLP Visit Diagnosis: Dysphagia, pharyngoesophageal phase (R13.14) Past Medical History: Past Medical History: Diagnosis Date  Anemia   Anxiety   Arthritis   Cancer (HCC)   colon cancer   Depression   Diabetes mellitus, type 2 (HCC)   Enlarged thyroid   Fatty liver  Past Surgical History: Past Surgical History: Procedure Laterality Date  ANTERIOR CERVICAL DECOMP/DISCECTOMY FUSION N/A 06/12/2023  Procedure: ANTERIOR CERVICAL DISCECTOMY AND FUSION, CERVICAL THREE-CERVICAL FOUR;  Surgeon: Julio Sicks, MD;  Location: [facility] OR;  Service: Neurosurgery;  Laterality: N/A;  3C  BOWEL RESECTION    CERVICAL SPINE SURGERY  06/17/2012  C5-C7 ACDF  CESAREAN SECTION     PARTIAL HYSTERECTOMY    PORT-A-CATH REMOVAL Left 07/28/2015  Procedure: REMOVAL PORT-A-CATH;  Surgeon: Romie Levee, MD;  Location: [facility];  Service: General;  Laterality: Left;  PORTACATH PLACEMENT Left 12/22/2014  Procedure: INSERTION PORT-A-CATH LEFT SUBCLAVIAN;  Surgeon: Romie Levee, MD;  Location: [facility];  Service: General;  Laterality: Left;  TONSILLECTOMY   Kerrie Pleasure, MA, CCC-SLP Acute Rehabilitation Services Office: [phone] 06/16/2023, 10:26 AM   Assessment/Plan: The patient underwent extension of her ACDF to C3-4 on 06/12/2023. She has a known history of dysphagia and a very large goiter, complicating her recovery. She returned to the hospital over the weekend due to increased difficulty swallowing. She was started on steroids with some improvement. Her modified barium swallow evaluation demonstrated some pre-existing issues that were complicated by postoperative swelling. The patient is safe to consume a regular diet, but is on a pureed diet with nectar thick liquids for comfort.   LOS: 3 days   -SLP and I discussed patient's situation at length with the patient, her daughter, and her husband. We answered all of their questions and clarified the findings of her swallow study. -The patient does not feel comfortable discharging home today. We will work toward discharging home tomorrow with a Medrol Dosepak. -Patient can follow up with ENT and GI providers following recovery from her ACDF if her symptoms continue to be bothersome.   Val Eagle, DNP, AGNP-C Nurse Practitioner  [facility] [address], [address], [place], [place] [address] P: [phone]    F: [phone]  06/17/2023, 11:26 AM

## 2023-06-17 NOTE — Progress Notes (Signed)
Speech Language Pathology Treatment: Dysphagia  Patient Details Name: Jenny [name] MRN: [id number] DOB: 1961-03-20 Today's Date: 06/17/2023 Time: 7564-3329 SLP Time Calculation (min) (ACUTE ONLY): 26 min  Assessment / Plan / Recommendation Clinical Impression  Pt seen in room with neurosurgery PA for education regarding MBSS.  Pt and family had questions regarding findings r/t prominent CP segment.  Provided education in room to pt with daughter and husband on phone.  Answered all questions.  Pt accepted more advanced PO trials today.  There was feeling of stasis with pt exhibiting throat clear/hiccupping that was observed during MBS yesterday in absence of penetration/aspiration. There was intermittent belching as well.  Discussed diet preference with pt.  She continues to feel more comfortable with nectar thick liquid v thin, but she would like to advance diet texture to attempt more solid foods.  Pt may try more advanced textures and consistencies as she is comfortable.  She is safe to advance up to a regular texture diet with thin liquid given her low aspiration risk, but continues to have significant discomfort with PO intake.  Recommend chopped/ground texture diet with nectar thick liquids.    HPI HPI: Pt is a 62 y.o. female who presented with dysphagia following ACDF C3-4 on 06/12/23. Pt reported on admission that dysphagia continued to worsen and that she could not swallow any medications. CT soft tissue: large amount of retropharyngeal fluid and right parapharyngeal edema. Mass effect on the larynx and esophagus mild narrowing of the supraglottic airway.  Dexamethasone (decacron) started 6/23.  PMH: ACDF C4-6, T2DM. MBS 03/16/20: Normal swallow function, but endorsed globus and irratation throughout. Biofeedback provided to demonstrate no residual food or liquid in the pharynx or esophagus. Suggest f/u to identify potential for GER exacerbating symptoms.      SLP Plan  Continue with  current plan of care      Recommendations for follow up therapy are one component of a multi-disciplinary discharge planning process, led by the attending physician.  Recommendations may be updated based on patient status, additional functional criteria and insurance authorization.    Recommendations  Diet recommendations: Dysphagia 2 (fine chop);Nectar-thick liquid Liquids provided via: Cup;Straw Medication Administration: Crushed with puree Supervision: Patient able to self feed Compensations: Slow rate;Small sips/bites;Follow solids with liquid Postural Changes and/or Swallow Maneuvers: Seated upright 90 degrees;Upright 30-60 min after meal                  Oral care BID   None Dysphagia, pharyngoesophageal phase (R13.14)     Continue with current plan of care     Kerrie Pleasure, MA, CCC-SLP Acute Rehabilitation Services Office: [phone] 06/17/2023, 11:53 AM

## 2023-06-17 NOTE — Plan of Care (Signed)
  Problem: Education: Goal: Ability to verbalize activity precautions or restrictions will improve Outcome: Progressing Goal: Knowledge of the prescribed therapeutic regimen will improve Outcome: Progressing Goal: Understanding of discharge needs will improve Outcome: Progressing   Problem: Activity: Goal: Ability to avoid complications of mobility impairment will improve Outcome: Progressing Goal: Ability to tolerate increased activity will improve Outcome: Progressing Goal: Will remain free from falls Outcome: Progressing   Problem: Bowel/Gastric: Goal: Gastrointestinal status for postoperative course will improve Outcome: Progressing   Problem: Clinical Measurements: Goal: Ability to maintain clinical measurements within normal limits will improve Outcome: Progressing Goal: Postoperative complications will be avoided or minimized Outcome: Progressing Goal: Diagnostic test results will improve Outcome: Progressing   Problem: Pain Management: Goal: Pain level will decrease Outcome: Progressing   Problem: Skin Integrity: Goal: Will show signs of wound healing Outcome: Progressing   Problem: Health Behavior/Discharge Planning: Goal: Identification of resources available to assist in meeting health care needs will improve Outcome: Progressing   Problem: Bladder/Genitourinary: Goal: Urinary functional status for postoperative course will improve Outcome: Progressing   Problem: Education: Goal: Ability to describe self-care measures that may prevent or decrease complications (Diabetes Survival Skills Education) will improve Outcome: Progressing Goal: Individualized Educational Video(s) Outcome: Progressing   Problem: Coping: Goal: Ability to adjust to condition or change in health will improve Outcome: Progressing   Problem: Fluid Volume: Goal: Ability to maintain a balanced intake and output will improve Outcome: Progressing   Problem: Health Behavior/Discharge  Planning: Goal: Ability to identify and utilize available resources and services will improve Outcome: Progressing Goal: Ability to manage health-related needs will improve Outcome: Progressing   Problem: Metabolic: Goal: Ability to maintain appropriate glucose levels will improve Outcome: Progressing   Problem: Nutritional: Goal: Maintenance of adequate nutrition will improve Outcome: Progressing Goal: Progress toward achieving an optimal weight will improve Outcome: Progressing   Problem: Skin Integrity: Goal: Risk for impaired skin integrity will decrease Outcome: Progressing   Problem: Tissue Perfusion: Goal: Adequacy of tissue perfusion will improve Outcome: Progressing   Problem: Education: Goal: Knowledge of General Education information will improve Description: Including pain rating scale, medication(s)/side effects and non-pharmacologic comfort measures Outcome: Progressing   Problem: Health Behavior/Discharge Planning: Goal: Ability to manage health-related needs will improve Outcome: Progressing   Problem: Clinical Measurements: Goal: Ability to maintain clinical measurements within normal limits will improve Outcome: Progressing Goal: Will remain free from infection Outcome: Progressing Goal: Diagnostic test results will improve Outcome: Progressing Goal: Respiratory complications will improve Outcome: Progressing Goal: Cardiovascular complication will be avoided Outcome: Progressing   Problem: Activity: Goal: Risk for activity intolerance will decrease Outcome: Progressing   Problem: Nutrition: Goal: Adequate nutrition will be maintained Outcome: Progressing   Problem: Coping: Goal: Level of anxiety will decrease Outcome: Progressing   Problem: Elimination: Goal: Will not experience complications related to bowel motility Outcome: Progressing Goal: Will not experience complications related to urinary retention Outcome: Progressing    Problem: Pain Managment: Goal: General experience of comfort will improve Outcome: Progressing   Problem: Safety: Goal: Ability to remain free from injury will improve Outcome: Progressing   Problem: Skin Integrity: Goal: Risk for impaired skin integrity will decrease Outcome: Progressing   

## 2023-06-18 ENCOUNTER — Other Ambulatory Visit: Payer: Self-pay | Admitting: *Deleted

## 2023-06-18 DIAGNOSIS — R1310 Dysphagia, unspecified: Secondary | ICD-10-CM

## 2023-06-18 LAB — GLUCOSE, CAPILLARY
Glucose-Capillary: 152 mg/dL — ABNORMAL HIGH (ref 70–99)
Glucose-Capillary: 164 mg/dL — ABNORMAL HIGH (ref 70–99)
Glucose-Capillary: 151 mg/dL — ABNORMAL HIGH (ref 70–99)
Glucose-Capillary: 189 mg/dL — ABNORMAL HIGH (ref 70–99)

## 2023-06-18 MED ORDER — METHYLPREDNISOLONE 4 MG PO TBPK: ORAL_TABLET | ORAL | 0 refills | Status: DC

## 2023-06-18 NOTE — Progress Notes (Signed)
Physical Therapy Treatment Patient Details Name: Jenny [name] MRN: [id number] DOB: 09/10/1961 Today's Date: 06/18/2023   History of Present Illness Pt is a 62 y/o female presenting with progressive dysphagia s/p admission 6/20-6/21 for ACDF C4-6. PMH: anemia, colon cancer, DM2    PT Comments    Pt reports receiving pain meds prior PT session, which appear to likely be contributing to her balance deficits and slow processing speed at this time. She needed min guard-minA for balance and safety when ambulating without an UE support today. She was able to navigate stairs at a min guard assist level when she was provided x1 handrail for support. At this time, pt is at risk for falls and recommended to utilize her RW for standing mobility. Educated pt and daughter on safe progression away from an AD and with stair negotiation. They verbalized understanding. Will continue to follow acutely.     Recommendations for follow up therapy are one component of a multi-disciplinary discharge planning process, led by the attending physician.  Recommendations may be updated based on patient status, additional functional criteria and insurance authorization.  Follow Up Recommendations       Assistance Recommended at Discharge Intermittent Supervision/Assistance  Patient can return home with the following A little help with walking and/or transfers;A little help with bathing/dressing/bathroom;Assistance with cooking/housework;Assist for transportation;Help with stairs or ramp for entrance   Equipment Recommendations  None recommended by PT    Recommendations for Other Services       Precautions / Restrictions Precautions Precautions: Fall;Cervical Precaution Booklet Issued: Yes (comment) Precaution Comments: reviewed precautions, needs reminders for compliance intermittently Required Braces or Orthoses: Cervical Brace Cervical Brace: Soft collar;For comfort Restrictions Weight Bearing Restrictions:  No     Mobility  Bed Mobility Overal bed mobility: Modified Independent Bed Mobility: Rolling, Sidelying to Sit Rolling: Modified independent (Device/Increase time) Sidelying to sit: Modified independent (Device/Increase time), HOB elevated       General bed mobility comments: Cues for log roll and compliance with cervical precautions during bed mobility, extra time, but mod I. HOB very minimally elevated and pt utilized bed rail    Transfers Overall transfer level: Needs assistance Equipment used: None Transfers: Sit to/from Stand Sit to Stand: Supervision           General transfer comment: Pt slow to initiate transfer and displayed mild sway upon standing, supervision for safety    Ambulation/Gait Ambulation/Gait assistance: Min guard, Min assist Gait Distance (Feet): 280 Feet Assistive device: None Gait Pattern/deviations: Decreased stride length, Step-through pattern, Narrow base of support, Drifts right/left Gait velocity: decr Gait velocity interpretation: <1.8 ft/sec, indicate of risk for recurrent falls   General Gait Details: Pt with slow, small, narrow steps and lateral swaying/drifting initially, progressing to increased stride length and improved stability as distance progressed. Pt's balance likely affected by the pain meds she reportedly recently received. Min guard-minA for safety and balance.   Stairs Stairs: Yes Stairs assistance: Min guard Stair Management: One rail Right, One rail Left, Step to pattern, Forwards Number of Stairs: 4 General stair comments: Ascends with R rail leading up with her L foot and descends with L rail leading down with her R foot, no LOB, min guard for safety   Wheelchair Mobility    Modified Rankin (Stroke Patients Only)       Balance Overall balance assessment: Needs assistance Sitting-balance support: Feet supported, No upper extremity supported Sitting balance-Leahy Scale: Fair     Standing balance support:  During functional  activity, No upper extremity supported Standing balance-Leahy Scale: Fair Standing balance comment: Needs up to minA to prevent LOB when ambulating without UE support                            Cognition Arousal/Alertness: Awake/alert Behavior During Therapy: WFL for tasks assessed/performed Overall Cognitive Status: Within Functional Limits for tasks assessed                                 General Comments: good recall of precautions, follows directions, shows insight into deficits, slowed processing today likely due to pain meds        Exercises      General Comments General comments (skin integrity, edema, etc.): educated pt and daughter on safe progression away from AD and with stair negotiation, they verbalized understanding      Pertinent Vitals/Pain Pain Assessment Pain Assessment: 0-10 Pain Score: 3  Pain Location: neck Pain Descriptors / Indicators: Sore, Operative site guarding, Grimacing Pain Intervention(s): Limited activity within patient's tolerance, Monitored during session, Premedicated before session, Repositioned    Home Living                          Prior Function            PT Goals (current goals can now be found in the care plan section) Acute Rehab PT Goals Patient Stated Goal: get better, regain independence PT Goal Formulation: With patient/family Time For Goal Achievement: 06/23/23 Potential to Achieve Goals: Good Progress towards PT goals: Progressing toward goals    Frequency    Min 3X/week      PT Plan Current plan remains appropriate    Co-evaluation              AM-PAC PT "6 Clicks" Mobility   Outcome Measure  Help needed turning from your back to your side while in a flat bed without using bedrails?: None Help needed moving from lying on your back to sitting on the side of a flat bed without using bedrails?: None Help needed moving to and from a bed to a chair  (including a wheelchair)?: A Little Help needed standing up from a chair using your arms (e.g., wheelchair or bedside chair)?: A Little Help needed to walk in hospital room?: A Little Help needed climbing 3-5 steps with a railing? : A Little 6 Click Score: 20    End of Session Equipment Utilized During Treatment: Gait belt Activity Tolerance: Patient tolerated treatment well Patient left: in bed;with call bell/phone within reach;with bed alarm set;with family/visitor present;Other (comment) (sitting EOB)   PT Visit Diagnosis: Unsteadiness on feet (R26.81);Muscle weakness (generalized) (M62.81);Other abnormalities of gait and mobility (R26.89)     Time: 8841-6606 PT Time Calculation (min) (ACUTE ONLY): 13 min  Charges:  $Gait Training: 8-22 mins                     Raymond Gurney, PT, DPT Acute Rehabilitation Services  Office: [phone]    Jewel Baize 06/18/2023, 3:05 PM

## 2023-06-18 NOTE — Discharge Instructions (Signed)

## 2023-06-18 NOTE — Consult Note (Signed)
[facility] Customer service manager [facility]) Accountable Care Organization (ACO) [facility] Liaison Note  06/18/2023  TIMICA MARCOM 1961-06-29 [id number] Covering Charlesetta Shanks, RN (hospital liaison)  Location: [facility] Liaison screened the patient remotely at [facility].  Insurance: [employer]   Jenny [name] is a 62 y.o. female who is a Primary Care Patient of Chambliss, Estill Batten, MD ([facility]). The patient was screened for 7 and 30 day readmission hospitalization with noted low risk score for unplanned readmission risk with 2 IP in 6 months.  The patient was assessed for potential [facility]) Care Management service needs for post hospital transition for care coordination. Review of patient's electronic medical record reveals patient was admitted for Dysphagia. Spoke with pt directly and introduced community base care coordination services for assistance with managing her ongoing care. Offered a post hospital prevention follow up call and pt was receptive. No other needs mentioned at this time. Pt verified she has sufficient transportation upon her discharge today home with self care and family support.   Plan: [facility] Liaison will continue to follow progress and disposition to asess for post hospital community care coordination/management needs.  Referral request for community care coordination: pending disposition.   [facility] Care Management/Population Health does not replace or interfere with any arrangements made by the Inpatient Transition of Care team.   For questions contact:   Elliot Cousin, RN, [facility] Liaison [place]   Population Health Office Hours MTWF  8:00 am-6:00 pm Off on Thursday [phone] mobile [phone] [Office toll free line] Office Hours are M-F 8:30 - 5 pm 24 hour nurse advise line [phone] Concierge  [email or web address]

## 2023-06-18 NOTE — Discharge Summary (Signed)
Physician Discharge Summary  Patient ID: Jenny [name] MRN: [id number] DOB/AGE: 1961-11-05 62 y.o.  Admit date: 06/14/2023 Discharge date: 06/18/2023  Admission Diagnoses:  Discharge Diagnoses:  Principal Problem:   Dysphagia, unspecified type   Discharged Condition: good  Hospital Course: Patient admitted to the hospital for treatment of significant dysphagia following anterior cervical discectomy and fusion.  Workup demonstrated normal postoperative swelling complicated by the patient's large thyroid nodule.  Barium swallow demonstrated no leak or aspiration.  Patient is tolerating a soft diet.  She is having no significant neck pain.  She is having no upper extremity symptoms.  She is standing ambulating and voiding without difficulty and feels ready for discharge home.  Consults:   Significant Diagnostic Studies:   Treatments:   Discharge Exam: Blood pressure (!) 165/99, pulse 78, temperature 98.3 F (36.8 C), temperature source Oral, resp. rate 20, height 5\' 3"  (1.6 m), weight 105.7 kg, SpO2 94 %. Awake and alert.  Oriented and appropriate.  Motor and sensory function intact.  Wound clean dry and intact.  Neck soft.  Airway intact.  Stent abdomen benign.  Disposition: Discharge disposition: 01-Home or Self Care       Discharge Instructions     Incentive spirometry RT   Complete by: As directed       Allergies as of 06/18/2023       Reactions   Other Rash   *Derma Bond*   Attends Briefs Small Other (See Comments)   Shellfish Allergy Rash        Medication List     TAKE these medications    acetaminophen 650 MG CR tablet Commonly known as: TYLENOL Take 1,300 mg by mouth every 8 (eight) hours as needed for pain.   ARIPiprazole 10 MG tablet Commonly known as: ABILIFY Take 1 tablet (10 mg total) by mouth daily. Start taking on: June 24, 2023   atorvastatin 20 MG tablet Commonly known as: LIPITOR TAKE 1 TABLET BY MOUTH EVERY DAY   buPROPion 150 MG  24 hr tablet Commonly known as: WELLBUTRIN XL Take 1 tablet (150 mg total) by mouth daily. Take total of 450 mg daily (300 mg + 150 mg)   buPROPion 300 MG 24 hr tablet Commonly known as: WELLBUTRIN XL Take 1 tablet (300 mg total) by mouth daily. Take total of 450 mg daily, along with 150 mg daily   cyclobenzaprine 10 MG tablet Commonly known as: FLEXERIL Take 1 tablet (10 mg total) by mouth 3 (three) times daily as needed for muscle spasms.   diclofenac Sodium 1 % Gel Commonly known as: VOLTAREN Apply 2 g topically 4 (four) times daily.   fluticasone 50 MCG/ACT nasal spray Commonly known as: FLONASE Place 2 sprays into both nostrils daily. What changed:  when to take this reasons to take this   gabapentin 300 MG capsule Commonly known as: NEURONTIN Take 1 capsule (300 mg total) by mouth 3 (three) times daily. Take 2-3 capsules before bed What changed: when to take this   HYDROcodone-acetaminophen 10-325 MG tablet Commonly known as: NORCO Take 1 tablet by mouth every 4 (four) hours as needed for severe pain ((score 7 to 10)).   hydrocortisone cream 0.5 % Apply 1 application  topically 2 (two) times daily as needed for itching.   Jardiance 10 MG Tabs tablet Generic drug: empagliflozin TAKE 1 TABLET BY MOUTH EVERY DAY What changed: how much to take   loratadine 10 MG tablet Commonly known as: CLARITIN Take 1 tablet (10 mg  total) by mouth daily. AS NEEDED What changed:  when to take this reasons to take this additional instructions   metFORMIN 500 MG 24 hr tablet Commonly known as: GLUCOPHAGE-XR TAKE 2 TABLETS BY MOUTH TWICE A DAY What changed:  how much to take when to take this   methocarbamol 500 MG tablet Commonly known as: Robaxin Take 1 tablet (500 mg total) by mouth 4 (four) times daily.   methylPREDNISolone 4 MG Tbpk tablet Commonly known as: MEDROL DOSEPAK follow package directions   multivitamin tablet Take 1 tablet by mouth daily.   Semaglutide  (2 MG/DOSE) 8 MG/3ML Sopn Inject 2 mg into the skin once a week. What changed: additional instructions   traZODone 50 MG tablet Commonly known as: DESYREL Take 1 tablet (50 mg total) by mouth at bedtime as needed for sleep.   venlafaxine XR 37.5 MG 24 hr capsule Commonly known as: EFFEXOR-XR Take 1 capsule (37.5 mg total) by mouth daily. TAKE 1 CAPSULE BY MOUTH DAILY ALONG WITH THE 150 MG FOR TOTAL DAILY DOSE OF 187.5 MG   venlafaxine XR 150 MG 24 hr capsule Commonly known as: EFFEXOR-XR Take 1 capsule (150 mg total) by mouth daily. Total of 187.5 mg daily. Take along with 37.5 mg cap Start taking on: July 14, 2023         Signed: Temple Pacini 06/18/2023, 10:27 AM

## 2023-06-18 NOTE — Progress Notes (Signed)
Speech Language Pathology Treatment: Dysphagia  Patient Details Name: Jenny [name] MRN: [id number] DOB: 1961/04/01 Today's Date: 06/18/2023 Time: 8657-8469 SLP Time Calculation (min) (ACUTE ONLY): 20 min  Assessment / Plan / Recommendation Clinical Impression  Pt was seen for dysphagia treatment with her daughter present. Pt and her daughter were educated regarding the results of the modified barium swallow study, diet recommendations, and swallowing precautions. Pt expressed concern regarding the "bar" in her neck from surgery. The role of the cricopharyngeus in swallowing and the meaning of the "CP bar" that was described during prior SLP session was explained. Video recording of the study was used to facilitate education and both parties verbalized understanding regarding all areas of education. Pt expressed gratitude and relief that the CP bar was not what she thought. Pt tolerated thin liquids and dysphagia 2 solids without overt s/s of aspiration. An audible swallow was still noted, but not to the extent that was previously observed. Pt's swallow appeared less effortful than during the initial evaluation and she reported that swallowing feels "much easier" than prior. Pt reported that she will continue use of the thickener at home since this feels easier to her. Pt currently has discharge orders, so a Simply Thick nectar thick starter kit was given and pt was educated on the use of thickener per her preference. Pt verbalized agreement with following up with outpatient SLP for additional support and case management was contacted regarding this recommendation.    HPI HPI: Pt is a 62 y.o. female who presented with dysphagia following ACDF C3-4 on 06/12/23. Pt reported on admission that dysphagia continued to worsen and that she could not swallow any medications. CT soft tissue: large amount of retropharyngeal fluid and right parapharyngeal edema. Mass effect on the larynx and esophagus mild narrowing  of the supraglottic airway.  Dexamethasone (decacron) started 6/23.  PMH: ACDF C4-6, T2DM. MBS 03/16/20: Normal swallow function, but endorsed globus and irratation throughout. Biofeedback provided to demonstrate no residual food or liquid in the pharynx or esophagus. Suggest f/u to identify potential for GER exacerbating symptoms.      SLP Plan  Continue with current plan of care      Recommendations for follow up therapy are one component of a multi-disciplinary discharge planning process, led by the attending physician.  Recommendations may be updated based on patient status, additional functional criteria and insurance authorization.    Recommendations  Diet recommendations: Dysphagia 2 (fine chop);Nectar-thick liquid (with advancement as tolerated) Liquids provided via: Cup;Straw Medication Administration: Crushed with puree Supervision: Patient able to self feed Compensations: Slow rate;Small sips/bites;Follow solids with liquid Postural Changes and/or Swallow Maneuvers: Seated upright 90 degrees;Upright 30-60 min after meal                  Oral care BID   None Dysphagia, pharyngoesophageal phase (R13.14)     Continue with current plan of care    Shefali Ng I. Vear Clock, MS, CCC-SLP Neuro Diagnostic Specialist  Acute Rehabilitation Services Office number: [phone]  Scheryl Marten  06/18/2023, 2:41 PM

## 2023-06-18 NOTE — TOC Transition Note (Addendum)
Transition of Care [facility]) - CM/SW Discharge Note   Patient Details  Name: Jenny [name] MRN: [id number] Date of Birth: [birth year]  Transition of Care [facility]) CM/SW Contact:  Kermit Balo, RN Phone Number: 06/18/2023, 10:29 AM   Clinical Narrative:    Pt is discharging home with self care. No needs per PT.  Pt has transportation home.  1418: ST recommending outpt ST. Pt prefers to attend at [facility]. Referral sent and information on the AVS.   Final next level of care: Home/Self Care Barriers to Discharge: No Barriers Identified   Patient Goals and CMS Choice      Discharge Placement                         Discharge Plan and Services Additional resources added to the After Visit Summary for                                       Social Determinants of Health (SDOH) Interventions SDOH Screenings   Food Insecurity: No Food Insecurity (03/18/2019)  Housing: Low Risk  (03/18/2019)  Transportation Needs: No Transportation Needs (03/18/2019)  Depression (PHQ2-9): High Risk (03/11/2023)  Financial Resource Strain: Low Risk  (06/01/2019)  Physical Activity: Unknown (06/01/2019)  Stress: Stress Concern Present (09/01/2019)  Tobacco Use: Low Risk  (06/14/2023)     Readmission Risk Interventions     No data to display

## 2023-06-19 ENCOUNTER — Telehealth: Payer: Self-pay

## 2023-06-19 NOTE — Transitions of Care (Post Inpatient/ED Visit) (Addendum)
06/19/2023  Name: Jenny [name] MRN: [id number] DOB: 11-13-1961  Today's TOC FU Call Status: Today's TOC FU Call Status:: Successful TOC FU Call Competed TOC FU Call Complete Date: 06/19/23  Transition Care Management Follow-up Telephone Call Date of Discharge: 06/18/23 Discharge Facility: [facility]) Type of Discharge: Inpatient Admission Primary Inpatient Discharge Diagnosis:: Dysphagia How have you been since you were released from the hospital?: Better (Patient notes she is doing fairly well, able to take her medications) Any questions or concerns?: No  Items Reviewed: Did you receive and understand the discharge instructions provided?: Yes Medications obtained,verified, and reconciled?: Yes (Medications Reviewed) (Discussed with patient that she should not be crushing her Wellbutrin XR tabs. Advised patient to contact her pharmacy to discuss.) Any new allergies since your discharge?: No Dietary orders reviewed?: Yes Type of Diet Ordered:: Low sodium, heart healthy, soft Do you have support at home?: Yes People in Home: spouse Name of Support/Comfort Primary Source: [name]  Medications Reviewed Today: Medications Reviewed Today     Reviewed by Larrie Kass, CPhT (Pharmacy Technician) on 06/14/23 at 2235  Med List Status: Complete   Medication Order Taking? Sig Documenting Provider Last Dose Status Informant  acetaminophen (TYLENOL) 650 MG CR tablet [id number] Yes Take 1,300 mg by mouth every 8 (eight) hours as needed for pain. [provider] Past Week Active Self, Pharmacy Records  ARIPiprazole (ABILIFY) 10 MG tablet [id number] Yes Take 1 tablet (10 mg total) by mouth daily. [name], MD 06/10/2023 Active Self, Pharmacy Records  atorvastatin (LIPITOR) 20 MG tablet [id number] Yes TAKE 1 TABLET BY MOUTH EVERY DAY Carney Living, MD 06/10/2023 Active Self, Pharmacy Records  buPROPion (WELLBUTRIN XL) 150 MG 24 hr tablet [id number] Yes Take 1 tablet (150 mg  total) by mouth daily. Take total of 450 mg daily (300 mg + 150 mg) [name], MD 06/10/2023 Active Self, Pharmacy Records  buPROPion (WELLBUTRIN XL) 300 MG 24 hr tablet [id number] Yes Take 1 tablet (300 mg total) by mouth daily. Take total of 450 mg daily, along with 150 mg daily [name], MD 06/10/2023 Active Self, Pharmacy Records  cyclobenzaprine (FLEXERIL) 10 MG tablet [id number] No Take 1 tablet (10 mg total) by mouth 3 (three) times daily as needed for muscle spasms.  Patient not taking: Reported on 06/14/2023   Val Eagle D, NP Not Taking Active Self, Pharmacy Records  diclofenac Sodium (VOLTAREN) 1 % GEL [id number] Yes Apply 2 g topically 4 (four) times daily. Cathlyn Parsons, NP Past Week Active Self, Pharmacy Records  fluticasone [facility]) 50 MCG/ACT nasal spray [id number] Yes Place 2 sprays into both nostrils daily.  Patient taking differently: Place 2 sprays into both nostrils daily as needed for allergies.   Alene Mires, NP UNKNOWN Active Self, Pharmacy Records  gabapentin (NEURONTIN) 300 MG capsule [id number] Yes Take 1 capsule (300 mg total) by mouth 3 (three) times daily. Take 2-3 capsules before bed  Patient taking differently: Take 300 mg by mouth 2 (two) times daily. Take 2-3 capsules before bed   Carney Living, MD 06/10/2023 Active Self, Pharmacy Records  HYDROcodone-acetaminophen [facility]) 10-325 MG tablet [id number] Yes Take 1 tablet by mouth every 4 (four) hours as needed for severe pain ((score 7 to 10)). Val Eagle D, NP Past Week Active Self, Pharmacy Records  hydrocortisone cream 0.5 % [id number] Yes Apply 1 application  topically 2 (two) times daily as needed for itching. [provider] UNKNOWN Active Self, Pharmacy Records  JARDIANCE 10 MG  TABS tablet [id number] Yes TAKE 1 TABLET BY MOUTH EVERY DAY  Patient taking differently: Take 10 mg by mouth daily.   Carney Living, MD 06/10/2023 Active Self, Pharmacy Records  loratadine  (CLARITIN) 10 MG tablet [id number] Yes Take 1 tablet (10 mg total) by mouth daily. AS NEEDED  Patient taking differently: Take 10 mg by mouth daily as needed for allergies.   Carney Living, MD UNKNOWN Active Self, Pharmacy Records  metFORMIN (GLUCOPHAGE-XR) 500 MG 24 hr tablet [id number] Yes TAKE 2 TABLETS BY MOUTH TWICE A DAY  Patient taking differently: Take 500 mg by mouth 2 (two) times daily with a meal.   Carney Living, MD 06/10/2023 Active Self, Pharmacy Records  methocarbamol (ROBAXIN) 500 MG tablet [id number] Yes Take 1 tablet (500 mg total) by mouth 4 (four) times daily. Sabino Dick, DO UNKNOWN Active Self, Pharmacy Records  Multiple Vitamin (MULTIVITAMIN) tablet [id number] Yes Take 1 tablet by mouth daily. [provider] Past Week Active Self, Pharmacy Records  Semaglutide, 2 MG/DOSE, 8 MG/3ML SOPN [id number] Yes Inject 2 mg into the skin once a week.  Patient taking differently: Inject 2 mg into the skin once a week. Tuesday   Carney Living, MD 06/10/2023 Active Self, Pharmacy Records  traZODone (DESYREL) 50 MG tablet [id number] Yes Take 1 tablet (50 mg total) by mouth at bedtime as needed for sleep. [name], MD Past Week Active Self, Pharmacy Records  venlafaxine XR (EFFEXOR-XR) 150 MG 24 hr capsule [id number] Yes Take 1 capsule (150 mg total) by mouth daily. Total of 187.5 mg daily. Take along with 37.5 mg cap [name], MD 06/10/2023 Active Self, Pharmacy Records  venlafaxine XR (EFFEXOR-XR) 37.5 MG 24 hr capsule [id number] Yes Take 1 capsule (37.5 mg total) by mouth daily. TAKE 1 CAPSULE BY MOUTH DAILY ALONG WITH THE 150 MG FOR TOTAL DAILY DOSE OF 187.5 MG Hisada, Barbee Cough, MD 06/10/2023 Active Self, Pharmacy Records            Home Care and Equipment/Supplies: Were Home Health Services Ordered?: No Any new equipment or medical supplies ordered?: No  Functional Questionnaire: Do you need assistance with bathing/showering or dressing?:  No Do you need assistance with meal preparation?: No Do you need assistance with eating?: No Do you have difficulty maintaining continence: No Do you need assistance with getting out of bed/getting out of a chair/moving?: No Do you have difficulty managing or taking your medications?: No  Follow up appointments reviewed: PCP Follow-up appointment confirmed?: Yes Date of PCP follow-up appointment?: 06/24/23 Follow-up Provider: Dr. Deirdre Priest Specialist [facility] Follow-up appointment confirmed?: NA Do you need transportation to your follow-up appointment?: No Do you understand care options if your condition(s) worsen?: Yes-patient verbalized understanding  SDOH Interventions Today    Flowsheet Row Most Recent Value  SDOH Interventions   Food Insecurity Interventions Intervention Not Indicated  Housing Interventions Intervention Not Indicated  Transportation Interventions Intervention Not Indicated      Interventions Today    Flowsheet Row Most Recent Value  Chronic Disease   Chronic disease during today's visit Diabetes  General Interventions   General Interventions Discussed/Reviewed General Interventions Discussed, General Interventions Reviewed, Referral to Nurse  Pharmacy Interventions   Pharmacy Dicussed/Reviewed Pharmacy Topics Discussed       TOC Interventions Today    Flowsheet Row Most Recent Value  TOC Interventions   TOC Interventions Discussed/Reviewed TOC Interventions Discussed, TOC Interventions Reviewed, Arranged PCP follow up within 7 days/Care Guide scheduled     Patient agreed  to appointments made with Dr. Deirdre Priest at 8:50 on 06/24/23 and appt with Juanell Fairly, RN at 11:00 on 06/25/23.  Verified the RN appt is a phone visit.  Jodelle Gross, RN, BSN, CCM Care Management Coordinator [place] Phone: [phone]/Fax: [phone]

## 2023-06-24 ENCOUNTER — Other Ambulatory Visit: Payer: Self-pay

## 2023-06-24 ENCOUNTER — Encounter: Payer: Self-pay | Admitting: Family Medicine

## 2023-06-24 ENCOUNTER — Ambulatory Visit: Payer: Medicare PPO | Admitting: Family Medicine

## 2023-06-24 VITALS — BP 94/72 | HR 79 | Ht 64.0 in | Wt 222.0 lb

## 2023-06-24 DIAGNOSIS — R1310 Dysphagia, unspecified: Secondary | ICD-10-CM

## 2023-06-24 DIAGNOSIS — M4712 Other spondylosis with myelopathy, cervical region: Secondary | ICD-10-CM | POA: Diagnosis not present

## 2023-06-24 DIAGNOSIS — M4722 Other spondylosis with radiculopathy, cervical region: Secondary | ICD-10-CM

## 2023-06-24 DIAGNOSIS — E119 Type 2 diabetes mellitus without complications: Secondary | ICD-10-CM

## 2023-06-24 DIAGNOSIS — E010 Iodine-deficiency related diffuse (endemic) goiter: Secondary | ICD-10-CM

## 2023-06-24 NOTE — Assessment & Plan Note (Signed)
Had diskectomy by Dr Dutch Quint.  Today with improved pain and better shoulder range of motion

## 2023-06-24 NOTE — Progress Notes (Signed)
    SUBJECTIVE:   CHIEF COMPLAINT / HPI:   Cervical Nerve compression C3-4 anterior cervical discectomy with interbody fusion utilizing her body cage, low curves that autograft, and anterior plate instrumentation on 6/20.  Readmitted for dysphagia on 6/22.  Feeling better now - able to swallowing liquids, soft solid and medications    Thyromegaly Has not heard from [facility] about an appointment  Diabetes Has been off metformin jardiance and ozempic because had low blood sugar in hospital.  Can check at home.   OBJECTIVE:   BP 94/72   Pulse 79   Ht 5\' 4"  (1.626 m)   Wt 222 lb (100.7 kg)   SpO2 100%   BMI 38.11 kg/m   Good spirits Lungs:  Normal respiratory effort, chest expands symmetrically. Lungs are clear to auscultation, no crackles or wheezes. Heart - Regular rate and rhythm.  No murmurs, gallops or rubs.    Neck - healing wound with steristrips.  No redness or discharge   ASSESSMENT/PLAN:   Type 2 diabetes mellitus without complication, without long-term current use of insulin (HCC) Assessment & Plan: Stable Asked her resume her metformin jardiance and ozempic.  Monitor her blood sugar    Thyromegaly Assessment & Plan: Recommend she call [facility] for an appointment.     Dysphagia, unspecified type Assessment & Plan: Improving after discharge.  Likely a combination of post cervical diskectomy soft tissue swelling and chronic thyromegaly.   Gradually resume regular diet    Cervical spondylosis with myelopathy and radiculopathy Assessment & Plan: Had diskectomy by Dr Dutch Quint.  Today with improved pain and better shoulder range of motion       Patient Instructions  Good to see you today - Thank you for coming in  Things we discussed today:  DIABETES Check you blood sugar each morning before you eat.  It less than 90 then call me Resume all the diabetes medications   You need an diabetes eye exam every year.  Please see your eye doctor.  Ask them to  fax [name] a report of your exam   Can call [facility] Address: [address], [place], [place] [address] Phone: [phone] For an appointment to see Dr Gerrit Friends    Please always bring your medication bottles  Come back to see me in 2 months    Carney Living, MD [facility]

## 2023-06-24 NOTE — Assessment & Plan Note (Signed)
Improving after discharge.  Likely a combination of post cervical diskectomy soft tissue swelling and chronic thyromegaly.   Gradually resume regular diet

## 2023-06-24 NOTE — Assessment & Plan Note (Signed)
Stable Asked her resume her metformin jardiance and ozempic.  Monitor her blood sugar

## 2023-06-24 NOTE — Patient Instructions (Signed)
Good to see you today - Thank you for coming in  Things we discussed today:  DIABETES Check you blood sugar each morning before you eat.  It less than 90 then call me Resume all the diabetes medications   You need an diabetes eye exam every year.  Please see your eye doctor.  Ask them to fax [name] a report of your exam   Can call [facility] Address: [address], [place], [place] [address] Phone: [phone] For an appointment to see Dr Gerrit Friends    Please always bring your medication bottles  Come back to see me in 2 months

## 2023-06-24 NOTE — Assessment & Plan Note (Signed)
Recommend she call [facility] for an appointment.

## 2023-06-25 ENCOUNTER — Ambulatory Visit: Payer: Self-pay

## 2023-06-25 NOTE — Patient Instructions (Signed)
Visit Information  Thank you for taking time to visit with me today. Please don't hesitate to contact me if I can be of assistance to you.   Following are the goals we discussed today:   Goals Addressed             This Visit's Progress    Get my diabetes back under control       Care Coordination Interventions: Provided education to patient about basic DM disease process Reviewed medications with patient and discussed importance of medication adherence Counseled on importance of regular laboratory monitoring as prescribed Discussed plans with patient for ongoing care management follow up and provided patient with direct contact information for care management team Provided patient with written educational materials related to hypo and hyperglycemia and importance of correct treatment Review of patient status, including review of consultants reports, relevant laboratory and other test results, and medications completed Active listening / Reflection utilized  Emotional Support Provided Problem Solving /Task Center strategies reviewed         Our next appointment is by telephone on 07/23/23 at 1130 am  Please call the care guide team at [phone] if you need to cancel or reschedule your appointment.   If you are experiencing a Mental Health or Behavioral Health Crisis or need someone to talk to, please call 1-800-273-TALK (toll free, 24 hour hotline)  Patient verbalizes understanding of instructions and care plan provided today and agrees to view in MyChart. Active MyChart status and patient understanding of how to access instructions and care plan via MyChart confirmed with patient.     Juanell Fairly RN, BSN, [facility] Care Coordinator [facility]   Phone: [phone]

## 2023-06-25 NOTE — Patient Outreach (Signed)
  Care Coordination   Initial Visit Note   06/25/2023 Name: Jenny [name] MRN: [id number] DOB: Jun 24, 1961  Jenny [name] is a 62 y.o. year old female who sees Chambliss, Estill Batten, MD for primary care. I spoke with  Jenny [name] by phone today.  What matters to the patients health and wellness today?  Jenny [name] is back home and doing the best she can. We went over her medications, and she has begun taking her diabetes medication again after visiting the doctor's office. She is currently on prednisone, and I explained that her blood sugar levels would be high because of the medication, as her blood sugar was 214 this morning, which she didn't understand. I will send her information about diabetes to review.    Goals Addressed             This Visit's Progress    Get my diabetes back under control       Care Coordination Interventions: Provided education to patient about basic DM disease process Reviewed medications with patient and discussed importance of medication adherence Counseled on importance of regular laboratory monitoring as prescribed Discussed plans with patient for ongoing care management follow up and provided patient with direct contact information for care management team Provided patient with written educational materials related to hypo and hyperglycemia and importance of correct treatment Review of patient status, including review of consultants reports, relevant laboratory and other test results, and medications completed Active listening / Reflection utilized  Emotional Support Provided Problem Solving /Task Center strategies reviewed         SDOH assessments and interventions completed:  Yes  SDOH Interventions Today    Flowsheet Row Most Recent Value  SDOH Interventions   Food Insecurity Interventions Intervention Not Indicated  Transportation Interventions Intervention Not Indicated        Care Coordination Interventions:  Yes, provided    Interventions Today    Flowsheet Row Most Recent Value  Chronic Disease   Chronic disease during today's visit Diabetes  General Interventions   General Interventions Discussed/Reviewed General Interventions Discussed, General Interventions Reviewed, Labs  Nutrition Interventions   Nutrition Discussed/Reviewed Nutrition Discussed  Pharmacy Interventions   Pharmacy Dicussed/Reviewed Pharmacy Topics Discussed  Safety Interventions   Safety Discussed/Reviewed Safety Discussed, Safety Reviewed        Follow up plan: Follow up call scheduled for 07/23/23  1130 am    Encounter Outcome:  Pt. Visit Completed   Juanell Fairly RN, BSN, [facility] Care Coordinator [facility]   Phone: [phone]

## 2023-06-27 ENCOUNTER — Ambulatory Visit (INDEPENDENT_AMBULATORY_CARE_PROVIDER_SITE_OTHER): Payer: Medicare PPO | Admitting: Psychiatry

## 2023-06-27 DIAGNOSIS — F330 Major depressive disorder, recurrent, mild: Secondary | ICD-10-CM

## 2023-06-27 DIAGNOSIS — F4310 Post-traumatic stress disorder, unspecified: Secondary | ICD-10-CM
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
## 2023-07-17 DIAGNOSIS — Z1231 Encounter for screening mammogram for malignant neoplasm of breast: Secondary | ICD-10-CM | POA: Diagnosis not present

## 2023-07-21 ENCOUNTER — Encounter: Payer: Self-pay | Admitting: Family Medicine

## 2023-07-21 ENCOUNTER — Telehealth: Payer: Self-pay

## 2023-07-21 NOTE — Telephone Encounter (Signed)
Patient calls nurse line reporting she tested positive for covid this morning.   She reports her symptoms started yesterday with cough, body aches, chills, low grade fever and headache. She reports some diarrhea.   Patient advised to try warm fluids with honey, OTC cough syrups and tylenol/ibuprofen for body aches and fevers.   Patient is requesting antiviral therapy.   Will forward to PCP.

## 2023-07-21 NOTE — Telephone Encounter (Signed)
See my chart message

## 2023-07-22 ENCOUNTER — Ambulatory Visit (INDEPENDENT_AMBULATORY_CARE_PROVIDER_SITE_OTHER): Payer: Medicare PPO | Admitting: Psychiatry

## 2023-07-22 DIAGNOSIS — F329 Major depressive disorder, single episode, unspecified: Secondary | ICD-10-CM | POA: Diagnosis not present

## 2023-07-22 DIAGNOSIS — F330 Major depressive disorder, recurrent, mild: Secondary | ICD-10-CM

## 2023-07-22 DIAGNOSIS — F4310 Post-traumatic stress disorder, unspecified: Secondary | ICD-10-CM
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
## 2023-07-23 ENCOUNTER — Ambulatory Visit: Payer: Self-pay

## 2023-07-23 NOTE — Patient Outreach (Unsigned)
  Care Coordination   Follow Up Visit Note   07/24/2023 Name: ANALYSSA ROHRBAUGH MRN: [id number] DOB: [birth year]  Scarlette Slice Grullon is a 62 y.o. year old female who sees Chambliss, Estill Batten, MD for primary care. I spoke with  [name] by phone today.  What matters to the patients health and wellness today?  Mrs. Swoveland has recently contracted COVID-19. She is treating the symptoms by staying hydrated, resting, and taking Tylenol. Her blood sugar levels for the past three days have been 130, 144, and 132. Prior to contracting COVID-19, she had started walking for exercise.      Goals Addressed             This Visit's Progress    Get my diabetes back under control       Care Coordination Interventions: Provided education to patient about basic DM disease process Reviewed medications with patient and discussed importance of medication adherence Counseled on importance of regular laboratory monitoring as prescribed Discussed plans with patient for ongoing care management follow up and provided patient with direct contact information for care management team Provided patient with written educational materials related to hypo and hyperglycemia and importance of correct treatment Review of patient status, including review of consultants reports, relevant laboratory and other test results, and medications completed Active listening / Reflection utilized  Emotional Support Provided Problem Solving /Task Center strategies reviewed Continue to check blood sugars daily          SDOH assessments and interventions completed:  No     Care Coordination Interventions:  Yes, provided   Follow up plan: Follow up call scheduled for 08/22/23  10 am    Encounter Outcome:  Pt. Visit Completed   Juanell Fairly RN, BSN, [facility] Care Coordinator [facility]   Phone: [phone]

## 2023-07-24 NOTE — Patient Instructions (Signed)
Visit Information  Thank you for taking time to visit with me today. Please don't hesitate to contact me if I can be of assistance to you.   Following are the goals we discussed today:   Goals Addressed             This Visit's Progress    Get my diabetes back under control       Care Coordination Interventions: Provided education to patient about basic DM disease process Reviewed medications with patient and discussed importance of medication adherence Counseled on importance of regular laboratory monitoring as prescribed Discussed plans with patient for ongoing care management follow up and provided patient with direct contact information for care management team Provided patient with written educational materials related to hypo and hyperglycemia and importance of correct treatment Review of patient status, including review of consultants reports, relevant laboratory and other test results, and medications completed Active listening / Reflection utilized  Emotional Support Provided Problem Solving /Task Center strategies reviewed Continue to check blood sugars daily          Our next appointment is by telephone on 08/22/23 at 10  am  Please call the care guide team at [phone] if you need to cancel or reschedule your appointment.   If you are experiencing a Mental Health or Behavioral Health Crisis or need someone to talk to, please call 1-800-273-TALK (toll free, 24 hour hotline)  Patient verbalizes understanding of instructions and care plan provided today and agrees to view in MyChart. Active MyChart status and patient understanding of how to access instructions and care plan via MyChart confirmed with patient.     Juanell Fairly RN, BSN, [facility] Care Coordinator [facility]   Phone: [phone]
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
## 2023-07-27 NOTE — Patient Instructions (Signed)
Ms. Zuccaro , Thank you for taking time to come for your Medicare Wellness Visit. I appreciate your ongoing commitment to your health goals. Please review the following plan we discussed and let me know if I can assist you in the future.   Referrals/Orders/Follow-Ups/Clinician Recommendations: Aim for 30 minutes of exercise or brisk walking, 6-8 glasses of water, and 5 servings of fruits and vegetables each day.  This is a list of the screening recommended for you and due dates:  Health Maintenance  Topic Date Due   Medicare Annual Wellness Visit  Never done   DTaP/Tdap/Td vaccine (1 - Tdap) Never done   Zoster (Shingles) Vaccine (1 of 2) Never done   Complete foot exam   06/03/2019   Eye exam for diabetics  12/08/2020   COVID-19 Vaccine (5 - 2023-24 season) 08/23/2022   Flu Shot  07/24/2023   Hemoglobin A1C  12/06/2023   Yearly kidney health urinalysis for diabetes  03/10/2024   Yearly kidney function blood test for diabetes  06/14/2024   Mammogram  08/12/2024   Colon Cancer Screening  09/13/2031   Hepatitis C Screening  Completed   HIV Screening  Completed   HPV Vaccine  Aged Out    Advanced directives: (ACP Link)Information on Advanced Care Planning can be found at [facility] of [place] Advance Health Care Directives Advance Health Care Directives ([email or web address]   Next Medicare Annual Wellness Visit scheduled for next year: Yes  Preventive Care 40-64 Years, Female Preventive care refers to lifestyle choices and visits with your health care provider that can promote health and wellness. What does preventive care include? A yearly physical exam. This is also called an annual well check. Dental exams once or twice a year. Routine eye exams. Ask your health care provider how often you should have your eyes checked. Personal lifestyle choices, including: Daily care of your teeth and gums. Regular physical activity. Eating a healthy diet. Avoiding tobacco and drug  use. Limiting alcohol use. Practicing safe sex. Taking low-dose aspirin daily starting at age 69. Taking vitamin and mineral supplements as recommended by your health care provider. What happens during an annual well check? The services and screenings done by your health care provider during your annual well check will depend on your age, overall health, lifestyle risk factors, and family history of disease. Counseling  Your health care provider may ask you questions about your: Alcohol use. Tobacco use. Drug use. Emotional well-being. Home and relationship well-being. Sexual activity. Eating habits. Work and work Astronomer. Method of birth control. Menstrual cycle. Pregnancy history. Screening  You may have the following tests or measurements: Height, weight, and BMI. Blood pressure. Lipid and cholesterol levels. These may be checked every 5 years, or more frequently if you are over 89 years old. Skin check. Lung cancer screening. You may have this screening every year starting at age 70 if you have a 30-pack-year history of smoking and currently smoke or have quit within the past 15 years. Fecal occult blood test (FOBT) of the stool. You may have this test every year starting at age 10. Flexible sigmoidoscopy or colonoscopy. You may have a sigmoidoscopy every 5 years or a colonoscopy every 10 years starting at age 73. Hepatitis C blood test. Hepatitis B blood test. Sexually transmitted disease (STD) testing. Diabetes screening. This is done by checking your blood sugar (glucose) after you have not eaten for a while (fasting). You may have this done every 1-3 years. Mammogram. This may be done  every 1-2 years. Talk to your health care provider about when you should start having regular mammograms. This may depend on whether you have a family history of breast cancer. BRCA-related cancer screening. This may be done if you have a family history of breast, ovarian, tubal, or  peritoneal cancers. Pelvic exam and Pap test. This may be done every 3 years starting at age 56. Starting at age 54, this may be done every 5 years if you have a Pap test in combination with an HPV test. Bone density scan. This is done to screen for osteoporosis. You may have this scan if you are at high risk for osteoporosis. Discuss your test results, treatment options, and if necessary, the need for more tests with your health care provider. Vaccines  Your health care provider may recommend certain vaccines, such as: Influenza vaccine. This is recommended every year. Tetanus, diphtheria, and acellular pertussis (Tdap, Td) vaccine. You may need a Td booster every 10 years. Zoster vaccine. You may need this after age 58. Pneumococcal 13-valent conjugate (PCV13) vaccine. You may need this if you have certain conditions and were not previously vaccinated. Pneumococcal polysaccharide (PPSV23) vaccine. You may need one or two doses if you smoke cigarettes or if you have certain conditions. Talk to your health care provider about which screenings and vaccines you need and how often you need them. This information is not intended to replace advice given to you by your health care provider. Make sure you discuss any questions you have with your health care provider. Document Released: 01/05/2016 Document Revised: 08/28/2016 Document Reviewed: 10/10/2015 Elsevier Interactive Patient Education  2017 [employer].    Fall Prevention in the Home Falls can cause injuries. They can happen to people of all ages. There are many things you can do to make your home safe and to help prevent falls. What can I do on the outside of my home? Regularly fix the edges of walkways and driveways and fix any cracks. Remove anything that might make you trip as you walk through a door, such as a raised step or threshold. Trim any bushes or trees on the path to your home. Use bright outdoor lighting. Clear any walking  paths of anything that might make someone trip, such as rocks or tools. Regularly check to see if handrails are loose or broken. Make sure that both sides of any steps have handrails. Any raised decks and porches should have guardrails on the edges. Have any leaves, snow, or ice cleared regularly. Use sand or salt on walking paths during winter. Clean up any spills in your garage right away. This includes oil or grease spills. What can I do in the bathroom? Use night lights. Install grab bars by the toilet and in the tub and shower. Do not use towel bars as grab bars. Use non-skid mats or decals in the tub or shower. If you need to sit down in the shower, use a plastic, non-slip stool. Keep the floor dry. Clean up any water that spills on the floor as soon as it happens. Remove soap buildup in the tub or shower regularly. Attach bath mats securely with double-sided non-slip rug tape. Do not have throw rugs and other things on the floor that can make you trip. What can I do in the bedroom? Use night lights. Make sure that you have a light by your bed that is easy to reach. Do not use any sheets or blankets that are too big for your  bed. They should not hang down onto the floor. Have a firm chair that has side arms. You can use this for support while you get dressed. Do not have throw rugs and other things on the floor that can make you trip. What can I do in the kitchen? Clean up any spills right away. Avoid walking on wet floors. Keep items that you use a lot in easy-to-reach places. If you need to reach something above you, use a strong step stool that has a grab bar. Keep electrical cords out of the way. Do not use floor polish or wax that makes floors slippery. If you must use wax, use non-skid floor wax. Do not have throw rugs and other things on the floor that can make you trip. What can I do with my stairs? Do not leave any items on the stairs. Make sure that there are handrails  on both sides of the stairs and use them. Fix handrails that are broken or loose. Make sure that handrails are as long as the stairways. Check any carpeting to make sure that it is firmly attached to the stairs. Fix any carpet that is loose or worn. Avoid having throw rugs at the top or bottom of the stairs. If you do have throw rugs, attach them to the floor with carpet tape. Make sure that you have a light switch at the top of the stairs and the bottom of the stairs. If you do not have them, ask someone to add them for you. What else can I do to help prevent falls? Wear shoes that: Do not have high heels. Have rubber bottoms. Are comfortable and fit you well. Are closed at the toe. Do not wear sandals. If you use a stepladder: Make sure that it is fully opened. Do not climb a closed stepladder. Make sure that both sides of the stepladder are locked into place. Ask someone to hold it for you, if possible. Clearly mark and make sure that you can see: Any grab bars or handrails. First and last steps. Where the edge of each step is. Use tools that help you move around (mobility aids) if they are needed. These include: Canes. Walkers. Scooters. Crutches. Turn on the lights when you go into a dark area. Replace any light bulbs as soon as they burn out. Set up your furniture so you have a clear path. Avoid moving your furniture around. If any of your floors are uneven, fix them. If there are any pets around you, be aware of where they are. Review your medicines with your doctor. Some medicines can make you feel dizzy. This can increase your chance of falling. Ask your doctor what other things that you can do to help prevent falls. This information is not intended to replace advice given to you by your health care provider. Make sure you discuss any questions you have with your health care provider. Document Released: 10/05/2009 Document Revised: 05/16/2016 Document Reviewed:  01/13/2015 Elsevier Interactive Patient Education  2017 [employer].

## 2023-07-27 NOTE — Progress Notes (Unsigned)
Subjective:   Jenny [name] is a 62 y.o. female who presents for Medicare Annual (Subsequent) preventive examination.  Visit Complete: {[phone]}  Patient Medicare AWV questionnaire was completed by the patient on ***; I have confirmed that all information answered by patient is correct and no changes since this date.  Review of Systems    ***       Objective:    There were no vitals filed for this visit. There is no height or weight on file to calculate BMI.     06/14/2023    5:20 PM 06/12/2023    6:22 AM 06/06/2023    8:33 AM 03/11/2023   10:15 AM 02/26/2023    9:41 AM 12/19/2022    4:06 PM 10/24/2022   10:13 AM  Advanced Directives  Does Patient Have a Medical Advance Directive? No No No No No No No  Would patient like information on creating a medical advance directive?   Yes (MAU/Ambulatory/Procedural Areas - Information given) No - Patient declined No - Patient declined No - Patient declined No - Patient declined    Current Medications (verified) Outpatient Encounter Medications as of 07/28/2023  Medication Sig   acetaminophen (TYLENOL) 650 MG CR tablet Take 1,300 mg by mouth every 8 (eight) hours as needed for pain.   ARIPiprazole (ABILIFY) 10 MG tablet Take 1 tablet (10 mg total) by mouth daily.   atorvastatin (LIPITOR) 20 MG tablet TAKE 1 TABLET BY MOUTH EVERY DAY   buPROPion (WELLBUTRIN XL) 150 MG 24 hr tablet Take 1 tablet (150 mg total) by mouth daily. Take total of 450 mg daily (300 mg + 150 mg)   buPROPion (WELLBUTRIN XL) 300 MG 24 hr tablet Take 1 tablet (300 mg total) by mouth daily. Take total of 450 mg daily, along with 150 mg daily   cyclobenzaprine (FLEXERIL) 10 MG tablet Take 1 tablet (10 mg total) by mouth 3 (three) times daily as needed for muscle spasms.   diclofenac Sodium (VOLTAREN) 1 % GEL Apply 2 g topically 4 (four) times daily.   fluticasone (FLONASE) 50 MCG/ACT nasal spray Place 2 sprays into both nostrils daily. (Patient taking  differently: Place 2 sprays into both nostrils daily as needed for allergies.)   gabapentin (NEURONTIN) 300 MG capsule Take 1 capsule (300 mg total) by mouth 3 (three) times daily. Take 2-3 capsules before bed (Patient taking differently: Take 300 mg by mouth 2 (two) times daily. Take 2-3 capsules before bed)   HYDROcodone-acetaminophen (NORCO) 10-325 MG tablet Take 1 tablet by mouth every 4 (four) hours as needed for severe pain ((score 7 to 10)).   hydrocortisone cream 0.5 % Apply 1 application  topically 2 (two) times daily as needed for itching.   JARDIANCE 10 MG TABS tablet TAKE 1 TABLET BY MOUTH EVERY DAY   loratadine (CLARITIN) 10 MG tablet Take 1 tablet (10 mg total) by mouth daily. AS NEEDED (Patient taking differently: Take 10 mg by mouth daily as needed for allergies.)   metFORMIN (GLUCOPHAGE-XR) 500 MG 24 hr tablet TAKE 2 TABLETS BY MOUTH TWICE A DAY   methocarbamol (ROBAXIN) 500 MG tablet Take 1 tablet (500 mg total) by mouth 4 (four) times daily.   methylPREDNISolone (MEDROL DOSEPAK) 4 MG TBPK tablet follow package directions   Multiple Vitamin (MULTIVITAMIN) tablet Take 1 tablet by mouth daily.   Semaglutide, 2 MG/DOSE, 8 MG/3ML SOPN Inject 2 mg into the skin once a week.   traZODone (DESYREL) 50 MG tablet Take 1 tablet (  50 mg total) by mouth at bedtime as needed for sleep.   venlafaxine XR (EFFEXOR-XR) 150 MG 24 hr capsule Take 1 capsule (150 mg total) by mouth daily. Total of 187.5 mg daily. Take along with 37.5 mg cap   venlafaxine XR (EFFEXOR-XR) 37.5 MG 24 hr capsule Take 1 capsule (37.5 mg total) by mouth daily. TAKE 1 CAPSULE BY MOUTH DAILY ALONG WITH THE 150 MG FOR TOTAL DAILY DOSE OF 187.5 MG   No facility-administered encounter medications on file as of 07/28/2023.    Allergies (verified) Other, Attends briefs small, and Shellfish allergy   History: Past Medical History:  Diagnosis Date   Anemia    Anxiety    Arthritis    Cancer (HCC)    colon cancer    Depression     Diabetes mellitus, type 2 (HCC)    Enlarged thyroid    Fatty liver    Past Surgical History:  Procedure Laterality Date   ANTERIOR CERVICAL DECOMP/DISCECTOMY FUSION N/A 06/12/2023   Procedure: ANTERIOR CERVICAL DISCECTOMY AND FUSION, CERVICAL THREE-CERVICAL FOUR;  Surgeon: Julio Sicks, MD;  Location: [facility] OR;  Service: Neurosurgery;  Laterality: N/A;  3C   BOWEL RESECTION     CERVICAL SPINE SURGERY  06/17/2012   C5-C7 ACDF   CESAREAN SECTION     PARTIAL HYSTERECTOMY     PORT-A-CATH REMOVAL Left 07/28/2015   Procedure: REMOVAL PORT-A-CATH;  Surgeon: Romie Levee, MD;  Location: [facility] ORS;  Service: General;  Laterality: Left;   PORTACATH PLACEMENT Left 12/22/2014   Procedure: INSERTION PORT-A-CATH LEFT SUBCLAVIAN;  Surgeon: Romie Levee, MD;  Location: [facility] OR[facility];  Service: General;  Laterality: Left;   TONSILLECTOMY     Family History  Problem Relation Age of Onset   Cancer Brother    Depression Brother    Cancer Brother    Hypertension Mother    Colon cancer Neg Hx    Social History   Socioeconomic History   Marital status: Married    Spouse name: [name]   Number of children: 3   Years of education: Not on file   Highest education level: Bachelor's degree (e.g., BA, AB, BS)  Occupational History   Not on file  Tobacco Use   Smoking status: Never    Passive exposure: Never   Smokeless tobacco: Never  Vaping Use   Vaping status: Never Used  Substance and Sexual Activity   Alcohol use: No   Drug use: No   Sexual activity: Yes    Birth control/protection: Surgical  Other Topics Concern   Not on file  Social History Narrative   Lives with husband and 3 children   Right handed   Caffeine: 4x a week   Social Determinants of Health   Financial Resource Strain: Low Risk  (06/01/2019)   Overall Financial Resource Strain (CARDIA)    Difficulty of Paying Living Expenses: Not very hard  Food Insecurity: No Food Insecurity (06/19/2023)   Hunger Vital Sign    Worried About Running  Out of Food in the Last Year: Never true    Ran Out of Food in the Last Year: Never true  Transportation Needs: No Transportation Needs (06/19/2023)   PRAPARE - Administrator, Civil Service (Medical): No    Lack of Transportation (Non-Medical): No  Physical Activity: Unknown (06/01/2019)   Exercise Vital Sign    Days of Exercise per Week: 2 days    Minutes of Exercise per Session: Not on file  Stress: Stress Concern Present (  09/01/2019)   [employer] [employer] - Occupational Stress Questionnaire    Feeling of Stress : Rather much  Social Connections: Not on file    Tobacco Counseling Counseling given: Not Answered   Clinical Intake:                        Activities of Daily Living    06/06/2023    8:37 AM 06/06/2023    8:21 AM  In your present state of health, do you have any difficulty performing the following activities:  Hearing?  0  Vision?  0  Difficulty concentrating or making decisions?  0  Walking or climbing stairs?  1  Dressing or bathing?  1  Doing errands, shopping? 0     Patient Care Team: Carney Living, MD as PCP - General Charna Elizabeth, MD as Consulting Physician (Gastroenterology) Romie Levee, MD as Consulting Physician (General Surgery) Malachy Mood, MD as Consulting Physician (Hematology) [name], MD as Referring Physician (Psychiatry)  Indicate any recent Medical Services you may have received from other than [facility] providers in the past year (date may be approximate).     Assessment:   This is a routine wellness examination for Jenny [name].  Hearing/Vision screen No results found.  Dietary issues and exercise activities discussed:     Goals Addressed   None    Depression Screen    03/11/2023   10:14 AM 02/26/2023    9:45 AM 12/19/2022    4:05 PM 12/03/2022    2:20 PM 10/24/2022   10:13 AM 10/02/2022    9:12 AM 06/26/2022   10:59 AM  PHQ 2/9 Scores  PHQ - 2 Score 2 2 3    3   PHQ- 9 Score  12 11 14    8   Exception Documentation     Patient refusal       Information is confidential and restricted. Go to Review Flowsheets to unlock data.    Fall Risk    06/25/2023   11:05 AM 03/11/2023   10:14 AM 03/06/2022   10:54 AM 09/27/2021    9:10 AM 09/04/2021    9:41 AM  Fall Risk   Falls in the past year? 1 0 0 0 0  Number falls in past yr: 0 0 0 0 0  Injury with Fall? 0 0 0 0 0  Risk for fall due to : Impaired balance/gait      Risk for fall due to: Comment pain un legs      Follow up Falls evaluation completed;Education provided;Falls prevention discussed        MEDICARE RISK AT HOME:   TIMED UP AND GO:  Was the test performed?  No    Cognitive Function:        Immunizations Immunization History  Administered Date(s) Administered   Influenza Whole 09/19/2008   Influenza,inj,Quad PF,6+ Mos 11/20/2014, 10/13/2015, 12/25/2016, 02/25/2018, 09/23/2018, 10/19/2019, 09/27/2020, 09/27/2021, 12/19/2022   PFIZER Comirnaty(Gray Top)Covid-19 Tri-Sucrose Vaccine 07/10/2021   PFIZER(Purple Top)SARS-COV-2 Vaccination 09/27/2020, 10/18/2020   Pfizer Covid-19 Vaccine Bivalent Booster 92yrs & up 09/27/2021   Pneumococcal Polysaccharide-23 12/25/2016    TDAP status: Due, Education has been provided regarding the importance of this vaccine. Advised may receive this vaccine at local pharmacy or Health Dept. Aware to provide a copy of the vaccination record if obtained from local pharmacy or Health Dept. Verbalized acceptance and understanding.  Flu Vaccine status: Up to date  Pneumococcal vaccine status: Up to date  Covid-19 vaccine status: Information provided on how to obtain vaccines.   Qualifies for Shingles Vaccine? Yes   Zostavax completed No   Shingrix Completed?: No.    Education has been provided regarding the importance of this vaccine. Patient has been advised to call insurance company to determine out of pocket expense if they have not yet received this vaccine. Advised  may also receive vaccine at local pharmacy or Health Dept. Verbalized acceptance and understanding.  Screening Tests Health Maintenance  Topic Date Due   Medicare Annual Wellness (AWV)  Never done   DTaP/Tdap/Td (1 - Tdap) Never done   Zoster Vaccines- Shingrix (1 of 2) Never done   FOOT EXAM  06/03/2019   OPHTHALMOLOGY EXAM  12/08/2020   COVID-19 Vaccine (5 - 2023-24 season) 08/23/2022   INFLUENZA VACCINE  07/24/2023   HEMOGLOBIN A1C  12/06/2023   Diabetic kidney evaluation - Urine ACR  03/10/2024   Diabetic kidney evaluation - eGFR measurement  06/14/2024   MAMMOGRAM  08/12/2024   Colonoscopy  09/13/2031   Hepatitis C Screening  Completed   HIV Screening  Completed   HPV VACCINES  Aged Out    Health Maintenance  Health Maintenance Due  Topic Date Due   Medicare Annual Wellness (AWV)  Never done   DTaP/Tdap/Td (1 - Tdap) Never done   Zoster Vaccines- Shingrix (1 of 2) Never done   FOOT EXAM  06/03/2019   OPHTHALMOLOGY EXAM  12/08/2020   COVID-19 Vaccine (5 - 2023-24 season) 08/23/2022   INFLUENZA VACCINE  07/24/2023    Colorectal cancer screening: Type of screening: Colonoscopy. Completed 09/12/21. Repeat every 10 years  Mammogram status: Completed 08/12/22. Repeat every year  Lung Cancer Screening: (Low Dose CT Chest recommended if Age 52-80 years, 20 pack-year currently smoking OR have quit w/in 15years.) does not qualify.   Lung Cancer Screening Referral: n/a  Additional Screening:  Hepatitis C Screening: does qualify; Completed 12/06/15  Vision Screening: Recommended annual ophthalmology exams for early detection of glaucoma and other disorders of the eye. Is the patient up to date with their annual eye exam?  {YES/NO:21197} Who is the provider or what is the name of the office in which the patient attends annual eye exams? *** If pt is not established with a provider, would they like to be referred to a provider to establish care? {YES/NO:21197}.   Dental  Screening: Recommended annual dental exams for proper oral hygiene  Diabetic Foot Exam: Diabetic Foot Exam: Overdue, Pt has been advised about the importance in completing this exam. Pt is scheduled for diabetic foot exam on at next office visit .  Community Resource Referral / Chronic Care Management: CRR required this visit?  {YES/NO:21197}  CCM required this visit?  {CCM Required choices:[phone]}     Plan:     I have personally reviewed and noted the following in the patient's chart:   Medical and social history Use of alcohol, tobacco or illicit drugs  Current medications and supplements including opioid prescriptions. {Opioid [phone]} Functional ability and status Nutritional status Physical activity Advanced directives List of other physicians Hospitalizations, surgeries, and ER visits in previous 12 months Vitals Screenings to include cognitive, depression, and falls Referrals and appointments  In addition, I have reviewed and discussed with patient certain preventive protocols, quality metrics, and best practice recommendations. A written personalized care plan for preventive services as well as general preventive health recommendations were provided to patient.     Kandis Fantasia [place], [place]   03/26/101  After Visit Summary: {CHL AMB AWV After Visit Summary:[phone]}  Nurse Notes: ***

## 2023-07-28 ENCOUNTER — Ambulatory Visit (INDEPENDENT_AMBULATORY_CARE_PROVIDER_SITE_OTHER): Payer: Medicare PPO

## 2023-07-28 VITALS — Ht 64.0 in | Wt 222.0 lb

## 2023-07-28 DIAGNOSIS — Z0000 Encounter for general adult medical examination without abnormal findings: Secondary | ICD-10-CM

## 2023-07-30 ENCOUNTER — Encounter: Payer: Self-pay | Admitting: Psychiatry

## 2023-07-30 ENCOUNTER — Telehealth: Payer: Medicare PPO | Admitting: Psychiatry

## 2023-07-30 DIAGNOSIS — F4310 Post-traumatic stress disorder, unspecified: Secondary | ICD-10-CM | POA: Diagnosis not present

## 2023-07-30 DIAGNOSIS — F330 Major depressive disorder, recurrent, mild: Secondary | ICD-10-CM

## 2023-07-30 DIAGNOSIS — G4700 Insomnia, unspecified: Secondary | ICD-10-CM | POA: Diagnosis not present

## 2023-07-30 MED ORDER — VENLAFAXINE HCL ER 37.5 MG PO CP24
37.5000 mg | ORAL_CAPSULE | Freq: Every day | ORAL | 1 refills | Status: DC
Start: 2023-08-14 — End: 2024-01-21

## 2023-07-30 MED ORDER — ARIPIPRAZOLE 10 MG PO TABS: 10.0000 mg | ORAL_TABLET | Freq: Every day | ORAL | 0 refills | Status: DC

## 2023-07-30 NOTE — Patient Instructions (Signed)
Continue venlafaxine 187.5  mg daily  Continue bupropion 450 mg (300 mg + 150 mg) daily Continue Abilify 10 mg daily Continue gabapentin 300 mg twice a day -drowsiness from higher dose Continue trazodone 25-50 mg at night as needed for sleep Next appointment: 10/9 at 10 30

## 2023-08-05 ENCOUNTER — Ambulatory Visit (HOSPITAL_COMMUNITY): Payer: Medicare PPO | Admitting: Psychiatry

## 2023-08-05 ENCOUNTER — Telehealth (HOSPITAL_COMMUNITY): Payer: Self-pay | Admitting: Psychiatry

## 2023-08-05 NOTE — Telephone Encounter (Signed)
Therapist attempted to contact patient via text through caregility platform for scheduled appointment, no response.  Therapist called patient, left message indicating attempt and requesting patient call office.

## 2023-08-22 ENCOUNTER — Ambulatory Visit: Payer: Self-pay

## 2023-08-23 NOTE — Patient Instructions (Signed)
Visit Information  Thank you for taking time to visit with me today. Please don't hesitate to contact me if I can be of assistance to you.   Following are the goals we discussed today:   Goals Addressed             This Visit's Progress    Get my diabetes and  her pain under control       Care Coordination Interventions: Provided education to patient about basic DM disease process Reviewed medications with patient and discussed importance of medication adherence Counseled on importance of regular laboratory monitoring as prescribed Discussed plans with patient for ongoing care management follow up and provided patient with direct contact information for care management team Provided patient with written educational materials related to hypo and hyperglycemia and importance of correct treatment Review of patient status, including review of consultants reports, relevant laboratory and other test results, and medications completed Active listening / Reflection utilized  Emotional Support Provided Problem Solving /Task Center strategies reviewed Continue to check blood sugars daily *patient will use heating pad or warm towel on painful area no more than 20 minutes at a time..Don't sleep with a heating pad on *patient will demonstrate use of different relaxation  skills and/or diversional activities to assist with pain reduction (distraction, imagery, relaxation, massage, acupressure, TENS, heat, and cold application. *patients will use pharmacological and nonpharmacological pain relief strategies like: creams, patches and supplements. Prayer and meditation can decrease pain Listening to music can decrease pain.   -Patient/Caregiver will take medications as prescribed   -Patient/Caregiver will attend all scheduled provider appointments -Patient/Caregiver will call pharmacy for medication refills 3-7 days in advance of running out of medications -Patient/Caregiver will call provider office  for new concerns or questions  -Patient/Caregiver will focus on medication adherence by taking medications as prescribed            Our next appointment is by telephone on 09/23/23 at 1030 am  Please call the care guide team at [phone] if you need to cancel or reschedule your appointment.   If you are experiencing a Mental Health or Behavioral Health Crisis or need someone to talk to, please call 1-800-273-TALK (toll free, 24 hour hotline)  Patient verbalizes understanding of instructions and care plan provided today and agrees to view in MyChart. Active MyChart status and patient understanding of how to access instructions and care plan via MyChart confirmed with patient.     Juanell Fairly RN, BSN, [facility] [employer] Phone: [phone]

## 2023-08-23 NOTE — Patient Outreach (Signed)
  Care Coordination   Follow Up Visit Note   08/22/2023 Name: Jenny [name] MRN: [id number] DOB: 03/17/1961  Jenny [name] is a 62 y.o. year old female who sees Chambliss, Estill Batten, MD for primary care. I spoke with  [name] by phone today.  What matters to the patients health and wellness today?  Jenny [name] is currently adapting to her circumstances as she experiences discomfort in her shoulders, both sides, and lower extremities. She is adhering to her prescribed medication regimen and applying icy hot for relief. While she has an upcoming follow-up appointment, she needs to confirm the date. Her blood glucose levels have consistently ranged between 120 and 140, and she is conscientiously maintaining her hydration by consuming water.    Goals Addressed             This Visit's Progress    Get my diabetes and  her pain under control       Care Coordination Interventions: Provided education to patient about basic DM disease process Reviewed medications with patient and discussed importance of medication adherence Counseled on importance of regular laboratory monitoring as prescribed Discussed plans with patient for ongoing care management follow up and provided patient with direct contact information for care management team Provided patient with written educational materials related to hypo and hyperglycemia and importance of correct treatment Review of patient status, including review of consultants reports, relevant laboratory and other test results, and medications completed Active listening / Reflection utilized  Emotional Support Provided Problem Solving /Task Center strategies reviewed Continue to check blood sugars daily *patient will use heating pad or warm towel on painful area no more than 20 minutes at a time..Don't sleep with a heating pad on *patient will demonstrate use of different relaxation  skills and/or diversional activities to assist with pain reduction  (distraction, imagery, relaxation, massage, acupressure, TENS, heat, and cold application. *patients will use pharmacological and nonpharmacological pain relief strategies like: creams, patches and supplements. Prayer and meditation can decrease pain Listening to music can decrease pain.   -Patient/Caregiver will take medications as prescribed   -Patient/Caregiver will attend all scheduled provider appointments -Patient/Caregiver will call pharmacy for medication refills 3-7 days in advance of running out of medications -Patient/Caregiver will call provider office for new concerns or questions  -Patient/Caregiver will focus on medication adherence by taking medications as prescribed            SDOH assessments and interventions completed:  No     Care Coordination Interventions:  Yes, provided   Interventions Today    Flowsheet Row Most Recent Value  Chronic Disease   Chronic disease during today's visit Diabetes  General Interventions   General Interventions Discussed/Reviewed General Interventions Discussed  Pharmacy Interventions   Pharmacy Dicussed/Reviewed Pharmacy Topics Discussed  Safety Interventions   Safety Discussed/Reviewed Safety Reviewed        Follow up plan: Follow up call scheduled for 09/23/23  1030 am    Encounter Outcome:  Pt. Visit Completed   Juanell Fairly RN, BSN, [facility]   Care Coordinator Phone: [phone]

## 2023-08-27 ENCOUNTER — Ambulatory Visit (INDEPENDENT_AMBULATORY_CARE_PROVIDER_SITE_OTHER): Payer: Medicare PPO | Admitting: Psychiatry

## 2023-08-27 DIAGNOSIS — F330 Major depressive disorder, recurrent, mild: Secondary | ICD-10-CM | POA: Diagnosis not present

## 2023-08-27 DIAGNOSIS — F4310 Post-traumatic stress disorder, unspecified: Secondary | ICD-10-CM
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
## 2023-08-28 ENCOUNTER — Other Ambulatory Visit: Payer: Self-pay | Admitting: Surgery

## 2023-08-28 DIAGNOSIS — E042 Nontoxic multinodular goiter: Secondary | ICD-10-CM

## 2023-09-11 DIAGNOSIS — M5412 Radiculopathy, cervical region: Secondary | ICD-10-CM | POA: Diagnosis not present

## 2023-09-11 DIAGNOSIS — G992 Myelopathy in diseases classified elsewhere: Secondary | ICD-10-CM | POA: Diagnosis not present

## 2023-09-16 ENCOUNTER — Ambulatory Visit (HOSPITAL_COMMUNITY): Payer: Medicare PPO | Admitting: Psychiatry

## 2023-09-18 DIAGNOSIS — Z7984 Long term (current) use of oral hypoglycemic drugs: Secondary | ICD-10-CM | POA: Diagnosis not present

## 2023-09-18 DIAGNOSIS — H04123 Dry eye syndrome of bilateral lacrimal glands: Secondary | ICD-10-CM | POA: Diagnosis not present

## 2023-09-18 DIAGNOSIS — H2513 Age-related nuclear cataract, bilateral: Secondary | ICD-10-CM | POA: Diagnosis not present

## 2023-09-18 DIAGNOSIS — H52203 Unspecified astigmatism, bilateral: Secondary | ICD-10-CM | POA: Diagnosis not present

## 2023-09-18 DIAGNOSIS — E119 Type 2 diabetes mellitus without complications: Secondary | ICD-10-CM | POA: Diagnosis not present

## 2023-09-18 DIAGNOSIS — H5211 Myopia, right eye: Secondary | ICD-10-CM | POA: Diagnosis not present

## 2023-09-18 DIAGNOSIS — H31002 Unspecified chorioretinal scars, left eye: Secondary | ICD-10-CM | POA: Diagnosis not present

## 2023-09-18 DIAGNOSIS — H524 Presbyopia: Secondary | ICD-10-CM | POA: Diagnosis not present

## 2023-09-18 DIAGNOSIS — H35033 Hypertensive retinopathy, bilateral: Secondary | ICD-10-CM | POA: Diagnosis not present

## 2023-09-18 LAB — HM DIABETES EYE EXAM

## 2023-09-23 ENCOUNTER — Telehealth: Payer: Self-pay

## 2023-09-23 NOTE — Patient Outreach (Signed)
Care Coordination   09/23/2023 Name: Jenny [name] MRN: [id number] DOB: [birth year]   Care Coordination Outreach Attempts:  An unsuccessful telephone outreach was attempted today to offer the patient information about available care coordination services.  Follow Up Plan:  Additional outreach attempts will be made to offer the patient care coordination information and services.   Encounter Outcome:  No Answer   Care Coordination Interventions:  No, not indicated    Juanell Fairly RN, BSN, [facility] [employer] Phone: [phone]

## 2023-09-24 ENCOUNTER — Other Ambulatory Visit: Payer: Self-pay | Admitting: Family Medicine

## 2023-09-25 ENCOUNTER — Telehealth: Payer: Self-pay | Admitting: *Deleted

## 2023-09-25 NOTE — Progress Notes (Signed)
Care Coordination Note  09/25/2023 Name: Jenny [name] MRN: [id number] DOB: 05-07-1961  Jenny [name] is a 62 y.o. year old female who is a primary care patient of Chambliss, Estill Batten, MD and is actively engaged with the care management team. I reached out to Jenny [name] by phone today to assist with re-scheduling a follow up visit with the RN Case Manager  Follow up plan: Telephone appointment with care management team member scheduled for:10/03/23  [facility] Coordination Care Guide  Direct Dial: [phone]

## 2023-09-26 NOTE — Progress Notes (Unsigned)
Sent a video visit link through Epic, but the patient didn't sign in. Tried calling for today's appointment, but got no answer. Left a voicemail instructing the patient to contact the office at [phone].  

## 2023-09-30 ENCOUNTER — Ambulatory Visit (INDEPENDENT_AMBULATORY_CARE_PROVIDER_SITE_OTHER): Payer: Medicare PPO | Admitting: Psychiatry

## 2023-09-30 DIAGNOSIS — F4310 Post-traumatic stress disorder, unspecified: Secondary | ICD-10-CM

## 2023-09-30 DIAGNOSIS — F339 Major depressive disorder, recurrent, unspecified: Secondary | ICD-10-CM | POA: Diagnosis not present

## 2023-09-30 DIAGNOSIS — F330 Major depressive disorder, recurrent, mild: Secondary | ICD-10-CM
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
## 2023-10-01 ENCOUNTER — Telehealth (INDEPENDENT_AMBULATORY_CARE_PROVIDER_SITE_OTHER): Payer: Medicare PPO | Admitting: Psychiatry

## 2023-10-01 DIAGNOSIS — Z91199 Patient's noncompliance with other medical treatment and regimen due to unspecified reason: Secondary | ICD-10-CM

## 2023-10-02 ENCOUNTER — Ambulatory Visit
Admission: RE | Admit: 2023-10-02 | Discharge: 2023-10-02 | Disposition: A | Discharge status: Home / Self Care | Payer: Medicare PPO | Source: Ambulatory Visit | Attending: Surgery | Admitting: Surgery

## 2023-10-02 DIAGNOSIS — E042 Nontoxic multinodular goiter: Secondary | ICD-10-CM

## 2023-10-03 ENCOUNTER — Other Ambulatory Visit: Payer: Self-pay | Admitting: Family Medicine

## 2023-10-03 ENCOUNTER — Ambulatory Visit: Payer: Self-pay

## 2023-10-03 NOTE — Patient Instructions (Signed)
Visit Information  Thank you for taking time to visit with me today. Please don't hesitate to contact me if I can be of assistance to you.   Following are the goals we discussed today:   Goals Addressed             This Visit's Progress    Get my diabetes and  her pain under control       Care Coordination Interventions: Provided education to patient about basic DM disease process Reviewed medications with patient and discussed importance of medication adherence Counseled on importance of regular laboratory monitoring as prescribed Discussed plans with patient for ongoing care management follow up and provided patient with direct contact information for care management team Provided patient with written educational materials related to hypo and hyperglycemia and importance of correct treatment Review of patient status, including review of consultants reports, relevant laboratory and other test results, and medications completed Active listening / Reflection utilized  Emotional Support Provided Problem Solving /Task Center strategies reviewed Continue to check blood sugars daily *patient will use heating pad or warm towel on painful area no more than 20 minutes at a time..Don't sleep with a heating pad on *patient will demonstrate use of different relaxation  skills and/or diversional activities to assist with pain reduction (distraction, imagery, relaxation, massage, acupressure, TENS, heat, and cold application. *patients will use pharmacological and nonpharmacological pain relief strategies like: creams, patches and supplements. Prayer and meditation can decrease pain Listening to music can decrease pain.   -Patient/Caregiver will take medications as prescribed   -Patient/Caregiver will attend all scheduled provider appointments -Patient/Caregiver will call pharmacy for medication refills 3-7 days in advance of running out of medications -Patient/Caregiver will call provider office  for new concerns or questions  Lab Results  Component Value Date   HGBA1C 6.6 (H) 06/06/2023            Our next appointment is by telephone on 11/05/23 at 10 am  Please call the care guide team at [phone] if you need to cancel or reschedule your appointment.   If you are experiencing a Mental Health or Behavioral Health Crisis or need someone to talk to, please call 1-800-273-TALK (toll free, 24 hour hotline)  Patient verbalizes understanding of instructions and care plan provided today and agrees to view in MyChart. Active MyChart status and patient understanding of how to access instructions and care plan via MyChart confirmed with patient.     Juanell Fairly RN, BSN, [facility] [employer] [employer] Phone: [phone]

## 2023-10-03 NOTE — Patient Outreach (Signed)
Care Coordination   Follow Up Visit Note   10/03/2023 Name: Jenny [name] MRN: [id number] DOB: [birth year]  Jenny [name] is a 62 y.o. year old female who sees Chambliss, Estill Batten, MD for primary care. I spoke with  Jenny [name] by phone today.  What matters to the patients health and wellness today?  I had a conversation with Jenny [name]. She expressed feeling frustration  and believes it might be due to her pain, and tiredness. She's been spending a lot of time with her children, as her husband has retired and is at home more often. We discussed the importance of communication with her husband. Her blood sugar levels are stable, staying around 148. However, she mentioned that she tends to eat more when she's frustrated, we disgused the impact on her health and joints, and blood sugar levels.    Goals Addressed             This Visit's Progress    Get my diabetes and  her pain under control       Care Coordination Interventions: Provided education to patient about basic DM disease process Reviewed medications with patient and discussed importance of medication adherence Counseled on importance of regular laboratory monitoring as prescribed Discussed plans with patient for ongoing care management follow up and provided patient with direct contact information for care management team Provided patient with written educational materials related to hypo and hyperglycemia and importance of correct treatment Review of patient status, including review of consultants reports, relevant laboratory and other test results, and medications completed Active listening / Reflection utilized  Emotional Support Provided Problem Solving /Task Center strategies reviewed Continue to check blood sugars daily *patient will use heating pad or warm towel on painful area no more than 20 minutes at a time..Don't sleep with a heating pad on *patient will demonstrate use of different relaxation  skills  and/or diversional activities to assist with pain reduction (distraction, imagery, relaxation, massage, acupressure, TENS, heat, and cold application. *patients will use pharmacological and nonpharmacological pain relief strategies like: creams, patches and supplements. Prayer and meditation can decrease pain Listening to music can decrease pain.   -Patient/Caregiver will take medications as prescribed   -Patient/Caregiver will attend all scheduled provider appointments -Patient/Caregiver will call pharmacy for medication refills 3-7 days in advance of running out of medications -Patient/Caregiver will call provider office for new concerns or questions  Lab Results  Component Value Date   HGBA1C 6.6 (H) 06/06/2023            SDOH assessments and interventions completed:  No     Care Coordination Interventions:  Yes, provided   Interventions Today    Flowsheet Row Most Recent Value  Chronic Disease   Chronic disease during today's visit Diabetes  General Interventions   General Interventions Discussed/Reviewed General Interventions Discussed  Mental Health Interventions   Mental Health Discussed/Reviewed Mental Health Discussed  Nutrition Interventions   Nutrition Discussed/Reviewed Nutrition Discussed  Pharmacy Interventions   Pharmacy Dicussed/Reviewed Pharmacy Topics Discussed  Safety Interventions   Safety Discussed/Reviewed Safety Discussed        Follow up plan: Follow up call scheduled for 11/05/23  10 am    Encounter Outcome:  Patient Visit Completed   Juanell Fairly RN, BSN, [facility]   Care Coordinator Phone: [phone]
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
## 2023-10-09 ENCOUNTER — Telehealth (INDEPENDENT_AMBULATORY_CARE_PROVIDER_SITE_OTHER): Payer: Medicare PPO | Admitting: Psychiatry

## 2023-10-09 ENCOUNTER — Encounter: Payer: Self-pay | Admitting: Psychiatry

## 2023-10-09 DIAGNOSIS — F4310 Post-traumatic stress disorder, unspecified: Secondary | ICD-10-CM

## 2023-10-09 DIAGNOSIS — G4700 Insomnia, unspecified: Secondary | ICD-10-CM

## 2023-10-09 DIAGNOSIS — F330 Major depressive disorder, recurrent, mild: Secondary | ICD-10-CM | POA: Diagnosis not present

## 2023-10-09 MED ORDER — BUPROPION HCL ER (XL) 150 MG PO TB24
150.0000 mg | ORAL_TABLET | Freq: Every day | ORAL | 1 refills | Status: DC
Start: 2023-10-29 — End: 2024-04-26

## 2023-10-09 MED ORDER — BUPROPION HCL ER (XL) 300 MG PO TB24: 300.0000 mg | ORAL_TABLET | Freq: Every day | ORAL | 1 refills | Status: DC

## 2023-10-14 ENCOUNTER — Ambulatory Visit (INDEPENDENT_AMBULATORY_CARE_PROVIDER_SITE_OTHER): Payer: Medicare PPO | Admitting: Psychiatry

## 2023-10-14 DIAGNOSIS — F339 Major depressive disorder, recurrent, unspecified: Secondary | ICD-10-CM

## 2023-10-14 DIAGNOSIS — F331 Major depressive disorder, recurrent, moderate: Secondary | ICD-10-CM

## 2023-10-14 DIAGNOSIS — F4310 Post-traumatic stress disorder, unspecified: Secondary | ICD-10-CM | POA: Diagnosis not present
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
## 2023-11-05 ENCOUNTER — Other Ambulatory Visit: Payer: Self-pay | Admitting: *Deleted

## 2023-11-05 NOTE — Patient Outreach (Signed)
Care Management   Visit Note  11/05/2023 Name: Jenny [name] MRN: [id number] DOB: 12-02-1961  Subjective: Jenny [name] is a 62 y.o. year old female who is a primary care patient of Chambliss, Estill Batten, MD. The Care Management team was consulted for assistance.      Engaged with patient spoke with patient by telephone.    Goals Addressed             This Visit's Progress    RNCM Care Managment Expected Outcome: Monitor, Self-Manage and Reduce Symptoms of: Diabetes, Pain       Current Barriers:  Knowledge Deficits related to plan of care for management of DMII and Pain  Chronic Disease Management support and education needs related to DMII and Pain   RNCM Clinical Goal(s):  Patient will verbalize basic understanding of  DMII and Pain disease process and self health management plan as evidenced by verbal explanation, recognizing symptoms, lifestyle modifications and daily monitoring take all medications exactly as prescribed and will call provider for medication related questions as evidenced by being compliant with all medications attend all scheduled medical appointments: with primary care provider and specialist as evidenced by keeping all scheduled appointments continue to work with RN Care Manager to address care management and care coordination needs related to  DMII and pain as evidenced by adherence to CM Team Scheduled appointments through collaboration with RN Care manager, provider, and care team.   Interventions: Evaluation of current treatment plan related to  self management and patient's adherence to plan as established by provider   Diabetes Interventions:  (Status:  New goal. and Goal on track:  Yes.) Long Term Goal Assessed patient's understanding of A1c goal: <6.5% Provided education to patient about basic DM disease process Reviewed medications with patient and discussed importance of medication adherence Counseled on importance of regular laboratory  monitoring as prescribed Discussed plans with patient for ongoing care management follow up and provided patient with direct contact information for care management team Review of patient status, including review of consultants reports, relevant laboratory and other test results, and medications completed Assessed social determinant of health barriers Lab Results  Component Value Date   HGBA1C 6.6 (H) 06/06/2023    Pain Interventions:  (Status:  New goal. and Goal on track:  NO.) Long Term Goal Pain assessment performed Medications reviewed Reviewed provider established plan for pain management Discussed importance of adherence to all scheduled medical appointments Counseled on the importance of reporting any/all new or changed pain symptoms or management strategies to pain management provider Advised patient to report to care team affect of pain on daily activities Discussed use of relaxation techniques and/or diversional activities to assist with pain reduction (distraction, imagery, relaxation, massage, acupressure, TENS, heat, and cold application Reviewed with patient prescribed pharmacological and nonpharmacological pain relief strategies Screening for signs and symptoms of depression related to chronic disease state   Patient Goals/Self-Care Activities: Take all medications as prescribed Attend all scheduled provider appointments Call pharmacy for medication refills 3-7 days in advance of running out of medications Perform all self care activities independently  Call provider office for new concerns or questions  call the Suicide and Crisis Lifeline: 988 call the [place] National Suicide Prevention Lifeline: (628)817-2855 or TTY: 848-334-1418 TTY (470) 008-8834) to talk to a trained counselor call 1-800-273-TALK (toll free, 24 hour hotline) go to [facility] [address], [place] [phone]) call 911 if experiencing a Mental Health or  Behavioral Health Crisis  check feet daily for cuts, sores or redness take the blood sugar log to all doctor visits manage portion size wear comfortable, cotton socks wear comfortable, well-fitting shoes  Follow Up Plan:  Telephone follow up appointment with care management team member scheduled for:  12-03-2023 at 10:00 am           Consent to Services:  Patient was given information about care management services, agreed to services, and gave verbal consent to participate.   Plan: Telephone follow up appointment with care management team member scheduled for: 12-03-2023 at 10:00 am  Danise Edge, BSN RN RN Care Manager  [facility]  Ambulatory Care Management  Direct Number: [phone]

## 2023-11-05 NOTE — Patient Instructions (Signed)
Visit Information  Thank you for taking time to visit with me today. Please don't hesitate to contact me if I can be of assistance to you before our next scheduled telephone appointment.  Following are the goals we discussed today:   Goals Addressed             This Visit's Progress    RNCM Care Managment Expected Outcome: Monitor, Self-Manage and Reduce Symptoms of: Diabetes, Pain       Current Barriers:  Knowledge Deficits related to plan of care for management of DMII and Pain  Chronic Disease Management support and education needs related to DMII and Pain   RNCM Clinical Goal(s):  Patient will verbalize basic understanding of  DMII and Pain disease process and self health management plan as evidenced by verbal explanation, recognizing symptoms, lifestyle modifications and daily monitoring take all medications exactly as prescribed and will call provider for medication related questions as evidenced by being compliant with all medications attend all scheduled medical appointments: with primary care provider and specialist as evidenced by keeping all scheduled appointments continue to work with RN Care Manager to address care management and care coordination needs related to  DMII and pain as evidenced by adherence to CM Team Scheduled appointments through collaboration with RN Care manager, provider, and care team.   Interventions: Evaluation of current treatment plan related to  self management and patient's adherence to plan as established by provider   Diabetes Interventions:  (Status:  New goal. and Goal on track:  Yes.) Long Term Goal Assessed patient's understanding of A1c goal: <6.5% Provided education to patient about basic DM disease process Reviewed medications with patient and discussed importance of medication adherence Counseled on importance of regular laboratory monitoring as prescribed Discussed plans with patient for ongoing care management follow up and provided  patient with direct contact information for care management team Review of patient status, including review of consultants reports, relevant laboratory and other test results, and medications completed Assessed social determinant of health barriers Lab Results  Component Value Date   HGBA1C 6.6 (H) 06/06/2023    Pain Interventions:  (Status:  New goal. and Goal on track:  NO.) Long Term Goal Pain assessment performed Medications reviewed Reviewed provider established plan for pain management Discussed importance of adherence to all scheduled medical appointments Counseled on the importance of reporting any/all new or changed pain symptoms or management strategies to pain management provider Advised patient to report to care team affect of pain on daily activities Discussed use of relaxation techniques and/or diversional activities to assist with pain reduction (distraction, imagery, relaxation, massage, acupressure, TENS, heat, and cold application Reviewed with patient prescribed pharmacological and nonpharmacological pain relief strategies Screening for signs and symptoms of depression related to chronic disease state   Patient Goals/Self-Care Activities: Take all medications as prescribed Attend all scheduled provider appointments Call pharmacy for medication refills 3-7 days in advance of running out of medications Perform all self care activities independently  Call provider office for new concerns or questions  call the Suicide and Crisis Lifeline: 988 call the [place] National Suicide Prevention Lifeline: 208-587-4100 or TTY: 308-879-4618 TTY 9142846830) to talk to a trained counselor call 1-800-273-TALK (toll free, 24 hour hotline) go to [facility] [address] [place] [phone]) call 911 if experiencing a Mental Health or Behavioral Health Crisis  check feet daily for cuts, sores or redness take the blood sugar log to all doctor  visits manage portion size  wear comfortable, cotton socks wear comfortable, well-fitting shoes  Follow Up Plan:  Telephone follow up appointment with care management team member scheduled for:  12-03-2023 at 10:00 am           Our next appointment is by telephone on 12-03-2023 at 10:00 am  Please call the care guide team at [phone] if you need to cancel or reschedule your appointment.   If you are experiencing a Mental Health or Behavioral Health Crisis or need someone to talk to, please call the Suicide and Crisis Lifeline: 988 call the [place] National Suicide Prevention Lifeline: (980)560-8984 or TTY: (712)710-4981 TTY (785) 592-9146) to talk to a trained counselor call 1-800-273-TALK (toll free, 24 hour hotline) go to [facility] [address], [place] [phone]) call 911   Patient verbalizes understanding of instructions and care plan provided today and agrees to view in MyChart. Active MyChart status and patient understanding of how to access instructions and care plan via MyChart confirmed with patient.     Telephone follow up appointment with care management team member scheduled for: 12-03-2023 at 10:00 am  Danise Edge, BSN RN RN Care Manager  [facility]  Ambulatory Care Management  Direct Number: [phone]

## 2023-11-11 ENCOUNTER — Ambulatory Visit: Payer: Self-pay | Admitting: Surgery

## 2023-11-11 DIAGNOSIS — E049 Nontoxic goiter, unspecified: Secondary | ICD-10-CM | POA: Diagnosis not present

## 2023-11-11 DIAGNOSIS — E042 Nontoxic multinodular goiter: Secondary | ICD-10-CM | POA: Diagnosis not present

## 2023-11-12 DIAGNOSIS — M5412 Radiculopathy, cervical region: Secondary | ICD-10-CM | POA: Diagnosis not present

## 2023-11-12 DIAGNOSIS — G992 Myelopathy in diseases classified elsewhere: Secondary | ICD-10-CM | POA: Diagnosis not present

## 2023-11-12 DIAGNOSIS — Z6839 Body mass index (BMI) 39.0-39.9, adult: Secondary | ICD-10-CM | POA: Diagnosis not present

## 2023-11-17 ENCOUNTER — Telehealth: Payer: Self-pay | Admitting: Family Medicine

## 2023-11-17 ENCOUNTER — Ambulatory Visit: Payer: Medicare PPO | Admitting: Family Medicine

## 2023-11-17 ENCOUNTER — Encounter: Payer: Self-pay | Admitting: Family Medicine

## 2023-11-17 VITALS — BP 134/89 | HR 80 | Temp 98.1°F | Wt 230.2 lb

## 2023-11-17 DIAGNOSIS — J029 Acute pharyngitis, unspecified: Secondary | ICD-10-CM

## 2023-11-17 DIAGNOSIS — B349 Viral infection, unspecified: Secondary | ICD-10-CM

## 2023-11-17 LAB — POC SOFIA 2 FLU + SARS ANTIGEN FIA
Influenza A, POC: NEGATIVE
Influenza B, POC: NEGATIVE
SARS Coronavirus 2 Ag: NEGATIVE

## 2023-11-17 LAB — POCT RAPID STREP A (OFFICE): Rapid Strep A Screen: NEGATIVE

## 2023-11-17 NOTE — Telephone Encounter (Signed)
Called patient at provided number, and after confirming name and date of birth, informed her that she does not have flu, COVID or strep throat advised patient to continue with supportive care and if she is not feeling better within a week to come back for follow-up appointment.

## 2023-11-17 NOTE — Patient Instructions (Addendum)
It was wonderful to see you today!  As we discussed your symptoms are most likely caused by virus.  We tested you for flu and COVID as well as for strep throat today.  If your strep test comes back positive I will call you in an antibiotic and you will get a phone call to let you know.  Otherwise you should continue to manage your symptoms with over-the-counter medications such as Mucinex, Tylenol and ibuprofen.  Please read the labels of any cold and flu medications carefully to make sure that they are not also Tylenol containing (acetaminophen) before taking an extra Tylenol.  You can alternate taking Tylenol or Tylenol containing medications with ibuprofen for continuous pain relief.  If you notice any of the following please go directly to the emergency department: -Difficulty swallowing -Trouble breathing - Sensation of your throat closing -Worsening swelling in your neck  Please call [phone] with any questions about today's appointment.   If you need any additional refills, please call your pharmacy before calling the office.  Gerrit Heck, DO Family Medicine

## 2023-11-17 NOTE — Assessment & Plan Note (Addendum)
Given duration, character and severity of symptoms most likely patient is experiencing a viral upper respiratory illness.  Advised patient to get rest and keep hydrated.  Discussed appropriate over-the-counter medications for pain as well as other cold and flu symptoms.  Provided patient with list of red flag symptoms which should prompt her to go to the emergency department.  Patient will return in 1 week if she has had no improvement in her symptoms.  See AVS for full details

## 2023-11-17 NOTE — Progress Notes (Signed)
    SUBJECTIVE:   CHIEF COMPLAINT / HPI:   Sick visit Cough and sore throat for 1 week.  Has also had nausea and vomiting.  Patient states that she feels like there is a lump in her throat which is painful to the touch and also makes swallowing painful.  Has been able to eat and drink normally although hot foods are her more comfortable cold/hard foods.  No documented fevers though she does feel she likely had one at some point.  Also has generalized sensation of malaise.  PERTINENT  PMH / PSH: Thyroid mass with upcoming thyroidectomy  OBJECTIVE:   BP 134/89   Pulse 80   Temp 98.1 F (36.7 C)   Wt 230 lb 4 oz (104.4 kg)   SpO2 99%   BMI 39.52 kg/m   Head: Normocephalic, atraumatic Eyes: Sclera clear, noninjected.  PERRLA Ears: Bilateral tympanic membranes intact, bilateral EACs intact Nose: Patent nares Throat: Mucous membranes moist and pink.  No obvious mass or tonsillar swelling obstructing the airway.  Mild erythema posterior nasopharynx.  Approximately 1 cm x 1 cm tender mobile mass in the submandibular region.  Uncertain whether this is related to her multiple thyroid nodules or if this represents a reactive lymph node.   ASSESSMENT/PLAN:   Viral illness Given duration, character and severity of symptoms most likely patient is experiencing a viral upper respiratory illness.  Advised patient to get rest and keep hydrated.  Discussed appropriate over-the-counter medications for pain as well as other cold and flu symptoms.  Provided patient with list of red flag symptoms which should prompt her to go to the emergency department.  Patient will return in 1 week if she has had no improvement in her symptoms.  See AVS for full details     Gerrit Heck, DO [facility]
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
## 2023-11-26 ENCOUNTER — Ambulatory Visit (HOSPITAL_COMMUNITY): Payer: Medicare PPO | Admitting: Psychiatry

## 2023-11-26 DIAGNOSIS — F331 Major depressive disorder, recurrent, moderate: Secondary | ICD-10-CM | POA: Diagnosis not present

## 2023-11-26 DIAGNOSIS — F4310 Post-traumatic stress disorder, unspecified: Secondary | ICD-10-CM | POA: Diagnosis not present
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
## 2023-11-27 ENCOUNTER — Encounter: Payer: Self-pay | Admitting: Psychiatry

## 2023-11-27 ENCOUNTER — Telehealth: Payer: Medicare PPO | Admitting: Psychiatry

## 2023-11-27 DIAGNOSIS — F331 Major depressive disorder, recurrent, moderate: Secondary | ICD-10-CM

## 2023-11-27 DIAGNOSIS — F4310 Post-traumatic stress disorder, unspecified: Secondary | ICD-10-CM

## 2023-11-27 DIAGNOSIS — G4700 Insomnia, unspecified: Secondary | ICD-10-CM

## 2023-11-27 MED ORDER — ARIPIPRAZOLE 10 MG PO TABS: 10.0000 mg | ORAL_TABLET | Freq: Every day | ORAL | 1 refills | Status: DC

## 2023-11-27 MED ORDER — TRAZODONE HCL 50 MG PO TABS: 50.0000 mg | ORAL_TABLET | Freq: Every evening | ORAL | 1 refills | Status: DC | PRN

## 2023-11-27 MED ORDER — VENLAFAXINE HCL ER 150 MG PO CP24: 150.0000 mg | ORAL_CAPSULE | Freq: Every day | ORAL | 1 refills | Status: DC

## 2023-11-27 NOTE — Patient Instructions (Signed)
Continue venlafaxine 187.5  mg daily  Continue bupropion 450 mg (300 mg + 150 mg) daily Continue Abilify 10 mg daily Continue gabapentin 300 mg twice a day Continue trazodone 25-50 mg at night as needed for sleep Next appointment: 1/29 at 3 pm

## 2023-12-03 ENCOUNTER — Other Ambulatory Visit: Payer: Self-pay | Admitting: *Deleted

## 2023-12-03 NOTE — Patient Instructions (Signed)
Visit Information  Thank you for taking time to visit with me today. Please don't hesitate to contact me if I can be of assistance to you before our next scheduled telephone appointment.  Following are the goals we discussed today:   Goals Addressed             This Visit's Progress    RNCM Care Managment Expected Outcome: Monitor, Self-Manage and Reduce Symptoms of: Diabetes, Pain       Current Barriers:  Knowledge Deficits related to plan of care for management of DMII and Pain  Chronic Disease Management support and education needs related to DMII and Pain   RNCM Clinical Goal(s):  Patient will verbalize basic understanding of  DMII and Pain disease process and self health management plan as evidenced by verbal explanation, recognizing symptoms, lifestyle modifications and daily monitoring take all medications exactly as prescribed and will call provider for medication related questions as evidenced by being compliant with all medications attend all scheduled medical appointments: with primary care provider and specialist as evidenced by keeping all scheduled appointments continue to work with RN Care Manager to address care management and care coordination needs related to  DMII and pain as evidenced by adherence to CM Team Scheduled appointments through collaboration with RN Care manager, provider, and care team.   Interventions: Evaluation of current treatment plan related to  self management and patient's adherence to plan as established by provider   Diabetes Interventions:  (Status:  New goal. and Goal on track:  Yes.) Long Term Goal Assessed patient's understanding of A1c goal: <6.5% Provided education to patient about basic DM disease process. Patient reports she is managing with her diabetes. She states that she has not had any hypoglycemic or hyperglycemic events. She reports her fasting blood sugar this morning is 140. Lowest 130 & highest 145. She is checking her blood  sugar once daily. Reports that she is trying to follow a ADA diet but reports difficulty with compliance. Reviewed medications with patient and discussed importance of medication adherence. Reports compliance with all medications Counseled on importance of regular laboratory monitoring as prescribed Discussed plans with patient for ongoing care management follow up and provided patient with direct contact information for care management team Review of patient status, including review of consultants reports, relevant laboratory and other test results, and medications completed Assessed social determinant of health barriers Has scheduled follow up appointment: PCP 12-09-2023 Patient reports that she continues to have gynecology issues and wants to request referral because she does not have one. She is also reporting that she needs a podiatry referral due to pain in her feet. RNCM advised to follow up with provider when she is in the office next week to ensure all referrals have been placed but I will send a note to the clinical team as a reminder. Lab Results  Component Value Date   HGBA1C 6.6 (H) 06/06/2023    Pain Interventions:  (Status:  New goal. and Goal on track:  NO.) Long Term Goal Pain assessment performed. Reports 8/10 pain this morning. She states that she is taking her gabapentin, Tylenol Arthritis and using Voltaren gel/Tiger balm to help with the pain. She reports that this is not effective most days and is requesting referral to a specialist for pain management. Medications reviewed Reviewed provider established plan for pain management Discussed importance of adherence to all scheduled medical appointments Counseled on the importance of reporting any/all new or changed pain symptoms or management strategies to  pain management provider Advised patient to report to care team affect of pain on daily activities Discussed use of relaxation techniques and/or diversional activities to  assist with pain reduction (distraction, imagery, relaxation, massage, acupressure, TENS, heat, and cold application Reviewed with patient prescribed pharmacological and nonpharmacological pain relief strategies Screening for signs and symptoms of depression related to chronic disease state   Patient Goals/Self-Care Activities: Take all medications as prescribed Attend all scheduled provider appointments Call pharmacy for medication refills 3-7 days in advance of running out of medications Perform all self care activities independently  Call provider office for new concerns or questions  call the Suicide and Crisis Lifeline: 988 call the [place] National Suicide Prevention Lifeline: (760)674-3303 or TTY: (512) 323-7906 TTY 367-725-9934) to talk to a trained counselor call 1-800-273-TALK (toll free, 24 hour hotline) go to [facility] [address] [place] [phone]) call 911 if experiencing a Mental Health or Behavioral Health Crisis  check feet daily for cuts, sores or redness take the blood sugar log to all doctor visits manage portion size wear comfortable, cotton socks wear comfortable, well-fitting shoes  Follow Up Plan:  Telephone follow up appointment with care management team member scheduled for:  12-31-2023 at 11:45 am           Our next appointment is by telephone on 12-31-2023 at 11:45 am  Please call the care guide team at [phone] if you need to cancel or reschedule your appointment.   If you are experiencing a Mental Health or Behavioral Health Crisis or need someone to talk to, please call the Suicide and Crisis Lifeline: 988 call the [place] National Suicide Prevention Lifeline: 3671613131 or TTY: (934)826-0583 TTY 251-559-1038) to talk to a trained counselor call 1-800-273-TALK (toll free, 24 hour hotline) go to [facility] [address] [place] [phone])   Patient verbalizes  understanding of instructions and care plan provided today and agrees to view in MyChart. Active MyChart status and patient understanding of how to access instructions and care plan via MyChart confirmed with patient.     Telephone follow up appointment with care management team member scheduled for:12-31-2023 at 11:45 am  Danise Edge, BSN RN RN Care Manager  [facility]  Ambulatory Care Management  Direct Number: [phone]

## 2023-12-03 NOTE — Patient Outreach (Addendum)
Care Management   Visit Note  12/03/2023 Name: Jenny [name] MRN: [id number] DOB: 11/03/1961  Subjective: Jenny [name] is a 62 y.o. year old female who is a primary care patient of Chambliss, Estill Batten, MD. The Care Management team was consulted for assistance.      Engaged with patient spoke with patient by telephone.   Goals Addressed             This Visit's Progress    RNCM Care Managment Expected Outcome: Monitor, Self-Manage and Reduce Symptoms of: Diabetes, Pain       Current Barriers:  Knowledge Deficits related to plan of care for management of DMII and Pain  Chronic Disease Management support and education needs related to DMII and Pain   RNCM Clinical Goal(s):  Patient will verbalize basic understanding of  DMII and Pain disease process and self health management plan as evidenced by verbal explanation, recognizing symptoms, lifestyle modifications and daily monitoring take all medications exactly as prescribed and will call provider for medication related questions as evidenced by being compliant with all medications attend all scheduled medical appointments: with primary care provider and specialist as evidenced by keeping all scheduled appointments continue to work with RN Care Manager to address care management and care coordination needs related to  DMII and pain as evidenced by adherence to CM Team Scheduled appointments through collaboration with RN Care manager, provider, and care team.   Interventions: Evaluation of current treatment plan related to  self management and patient's adherence to plan as established by provider   Diabetes Interventions:  (Status:  New goal. and Goal on track:  Yes.) Long Term Goal Assessed patient's understanding of A1c goal: <6.5% Provided education to patient about basic DM disease process. Patient reports she is managing with her diabetes. She states that she has not had any hypoglycemic or hyperglycemic events. She reports  her fasting blood sugar this morning is 140. Lowest 130 & highest 145. She is checking her blood sugar once daily. Reports that she is trying to follow a ADA diet but reports difficulty with compliance. Reviewed medications with patient and discussed importance of medication adherence. Reports compliance with all medications Counseled on importance of regular laboratory monitoring as prescribed Discussed plans with patient for ongoing care management follow up and provided patient with direct contact information for care management team Review of patient status, including review of consultants reports, relevant laboratory and other test results, and medications completed Assessed social determinant of health barriers Has scheduled follow up appointment: PCP 12-09-2023 Patient reports that she continues to have gynecology issues and wants to request referral because she does not have one. She is also reporting that she needs a podiatry referral due to pain in her feet. RNCM advised to follow up with provider when she is in the office next week to ensure all referrals have been placed but I will send a note to the clinical team as a reminder. Lab Results  Component Value Date   HGBA1C 6.6 (H) 06/06/2023    Pain Interventions:  (Status:  New goal. and Goal on track:  NO.) Long Term Goal Pain assessment performed. Reports 8/10 pain this morning. She states that she is taking her gabapentin, Tylenol Arthritis and using Voltaren gel/Tiger balm to help with the pain. She reports that this is not effective most days and is requesting referral to a specialist for pain management. Medications reviewed Reviewed provider established plan for pain management Discussed importance of adherence to all  scheduled medical appointments Counseled on the importance of reporting any/all new or changed pain symptoms or management strategies to pain management provider Advised patient to report to care team affect of pain  on daily activities Discussed use of relaxation techniques and/or diversional activities to assist with pain reduction (distraction, imagery, relaxation, massage, acupressure, TENS, heat, and cold application Reviewed with patient prescribed pharmacological and nonpharmacological pain relief strategies Screening for signs and symptoms of depression related to chronic disease state   Patient Goals/Self-Care Activities: Take all medications as prescribed Attend all scheduled provider appointments Call pharmacy for medication refills 3-7 days in advance of running out of medications Perform all self care activities independently  Call provider office for new concerns or questions  call the Suicide and Crisis Lifeline: 988 call the [place] National Suicide Prevention Lifeline: (703)651-9580 or TTY: 9025716876 TTY (774)037-6377) to talk to a trained counselor call 1-800-273-TALK (toll free, 24 hour hotline) go to [facility] [address], [place] [phone]) call 911 if experiencing a Mental Health or Behavioral Health Crisis  check feet daily for cuts, sores or redness take the blood sugar log to all doctor visits manage portion size wear comfortable, cotton socks wear comfortable, well-fitting shoes  Follow Up Plan:  Telephone follow up appointment with care management team member scheduled for:  12-31-2023 at 11:45 am                Consent to Services:  Patient was given information about care management services, agreed to services, and gave verbal consent to participate.   Plan: Telephone follow up appointment with care management team member scheduled for:12-31-2023 at 11:45 AM  Danise Edge, BSN RN RN Care Manager  [facility]  Ambulatory Care Management  Direct Number: [phone]

## 2023-12-09 ENCOUNTER — Encounter: Payer: Self-pay | Admitting: Family Medicine

## 2023-12-09 ENCOUNTER — Ambulatory Visit: Payer: Medicare PPO | Admitting: Family Medicine

## 2023-12-09 VITALS — BP 121/66 | HR 72 | Ht 64.0 in | Wt 235.8 lb

## 2023-12-09 DIAGNOSIS — Z7984 Long term (current) use of oral hypoglycemic drugs: Secondary | ICD-10-CM

## 2023-12-09 DIAGNOSIS — E010 Iodine-deficiency related diffuse (endemic) goiter: Secondary | ICD-10-CM

## 2023-12-09 DIAGNOSIS — G893 Neoplasm related pain (acute) (chronic): Secondary | ICD-10-CM

## 2023-12-09 DIAGNOSIS — M4722 Other spondylosis with radiculopathy, cervical region: Secondary | ICD-10-CM | POA: Diagnosis not present

## 2023-12-09 DIAGNOSIS — Z6841 Body Mass Index (BMI) 40.0 and over, adult: Secondary | ICD-10-CM

## 2023-12-09 DIAGNOSIS — E66813 Obesity, class 3: Secondary | ICD-10-CM | POA: Diagnosis not present

## 2023-12-09 DIAGNOSIS — E7800 Pure hypercholesterolemia, unspecified: Secondary | ICD-10-CM

## 2023-12-09 DIAGNOSIS — M4712 Other spondylosis with myelopathy, cervical region: Secondary | ICD-10-CM | POA: Diagnosis not present

## 2023-12-09 DIAGNOSIS — E119 Type 2 diabetes mellitus without complications: Secondary | ICD-10-CM | POA: Diagnosis not present

## 2023-12-09 DIAGNOSIS — Z23 Encounter for immunization: Secondary | ICD-10-CM | POA: Diagnosis not present

## 2023-12-09 LAB — POCT GLYCOSYLATED HEMOGLOBIN (HGB A1C): HbA1c, POC (controlled diabetic range): 7.1 % — AB (ref 0.0–7.0)

## 2023-12-09 NOTE — Assessment & Plan Note (Signed)
Scheduled for Thyroidectomy in Jan 2025

## 2023-12-09 NOTE — Assessment & Plan Note (Addendum)
Had surgery in June 2024 with Dr Jordan Likes - slowly improving

## 2023-12-09 NOTE — Assessment & Plan Note (Signed)
Recommend increasing gabapentin at night and adding low dose ibuprofen to her tylenol as well as regular exercise

## 2023-12-09 NOTE — Patient Instructions (Addendum)
Things we discussed today:  Pain and Weight - Increase the gabapentin to 3 tabs at night - Take ibuprofen 2 tabs twice a day as needed  - make sure not taking methocarbamol - regular exercise - walking and weight reduction will help  - look into Silver sneakers   I will call you if your tests are not good.  Otherwise, I will send you a message on MyChart (if it is active) or a letter in the mail..  If you do not hear from me with in 2 weeks please call our office.      Come back in 3 months to see you new doctor   I have really enjoyed working with you - Be Well

## 2023-12-09 NOTE — Assessment & Plan Note (Signed)
Diabetes is stable.  Encouraged exercise. Continue current medications   Lab Results  Component Value Date   HGBA1C 7.1 (A) 12/09/2023

## 2023-12-09 NOTE — Assessment & Plan Note (Signed)
Unfortunately not losing weight despite Ozempic 2 mg.  Likely lack of exercise and stress eating.   She plans to start exercising again in Jan.  If still not losing consider changing GLP1.

## 2023-12-09 NOTE — Progress Notes (Signed)
    SUBJECTIVE:   CHIEF COMPLAINT / HPI:   Diabetes Taking metformin most days and jardiance and semaglutide 2 mg.  Has not been exercising and has been stress eating  Sore Throat Improved from last visit  Obesity Gained almost 6 lbs   Cholesterol Taking lipitor daily  Mood Has been worse since her husband lost his job in September.  She is seeing her psychiatrist regularly   OBJECTIVE:   BP 121/66   Pulse 72   Ht 5\' 4"  (1.626 m)   Wt 235 lb 12.8 oz (107 kg)   SpO2 99%   BMI 40.47 kg/m   Good spirits   ASSESSMENT/PLAN:   Type 2 diabetes mellitus without complication, without long-term current use of insulin (HCC) Assessment & Plan: Diabetes is stable.  Encouraged exercise. Continue current medications   Lab Results  Component Value Date   HGBA1C 7.1 (A) 12/09/2023     Orders: -     POCT glycosylated hemoglobin (Hb A1C) -     Lipid panel  Hypercholesteremia -     Lipid panel  Encounter for immunization -     Flu vaccine trivalent PF, 6mos and older(Flulaval,Afluria,Fluarix,Fluzone)  Thyromegaly Assessment & Plan: Scheduled for Thyroidectomy in Jan 2025   Cervical spondylosis with myelopathy and radiculopathy Assessment & Plan: Had surgery in June 2024 with Dr Jordan Likes - slowly improving    Class 3 severe obesity due to excess calories without serious comorbidity with body mass index (BMI) of 40.0 to 44.9 in adult [facility]) Assessment & Plan: Unfortunately not losing weight despite Ozempic 2 mg.  Likely lack of exercise and stress eating.   She plans to start exercising again in Jan.  If still not losing consider changing GLP1.     Chronic pain after treatment for malignant neoplasm Assessment & Plan: Recommend increasing gabapentin at night and adding low dose ibuprofen to her tylenol as well as regular exercise       Patient Instructions  Things we discussed today:  Pain and Weight - Increase the gabapentin to 3 tabs at night - Take  ibuprofen 2 tabs twice a day as needed  - make sure not taking methocarbamol - regular exercise - walking and weight reduction will help  - look into Silver sneakers   I will call you if your tests are not good.  Otherwise, I will send you a message on MyChart (if it is active) or a letter in the mail..  If you do not hear from me with in 2 weeks please call our office.      Come back in 3 months to see you new doctor   I have really enjoyed working with you - Be Well    Carney Living, MD [facility]

## 2023-12-10 LAB — LIPID PANEL
Chol/HDL Ratio: 2.6 {ratio} (ref 0.0–4.4)
Cholesterol, Total: 114 mg/dL (ref 100–199)
HDL: 44 mg/dL (ref >39)
LDL Chol Calc (NIH): 48 mg/dL (ref 0–99)
Triglycerides: 120 mg/dL (ref 0–149)
VLDL Cholesterol Cal: 22 mg/dL (ref 5–40)

## 2023-12-19 ENCOUNTER — Ambulatory Visit (INDEPENDENT_AMBULATORY_CARE_PROVIDER_SITE_OTHER): Payer: Medicare PPO | Admitting: Psychiatry

## 2023-12-19 DIAGNOSIS — F331 Major depressive disorder, recurrent, moderate: Secondary | ICD-10-CM

## 2023-12-19 DIAGNOSIS — F4310 Post-traumatic stress disorder, unspecified: Secondary | ICD-10-CM | POA: Diagnosis not present
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
## 2023-12-31 ENCOUNTER — Other Ambulatory Visit: Payer: Self-pay | Admitting: *Deleted

## 2023-12-31 ENCOUNTER — Other Ambulatory Visit: Payer: Self-pay | Admitting: Family Medicine

## 2023-12-31 NOTE — Patient Outreach (Signed)
 Care Management   Visit Note  12/31/2023 Name: Jenny [name] MRN: [id number] DOB: 04-03-1961  Subjective: Jenny [name] is a 63 y.o. year old female who is a primary care patient of Chambliss, Layman CROME, MD. The Care Management team was consulted for assistance.      Engaged with patient spoke with patient by telephone.    Goals Addressed             This Visit's Progress    RNCM Care Managment Expected Outcome: Monitor, Self-Manage and Reduce Symptoms of: Diabetes, Pain       Current Barriers:  Knowledge Deficits related to plan of care for management of DMII and Pain  Chronic Disease Management support and education needs related to DMII and Pain   RNCM Clinical Goal(s):  Patient will verbalize basic understanding of  DMII and Pain disease process and self health management plan as evidenced by verbal explanation, recognizing symptoms, lifestyle modifications and daily monitoring take all medications exactly as prescribed and will call provider for medication related questions as evidenced by being compliant with all medications attend all scheduled medical appointments: with primary care provider and specialist as evidenced by keeping all scheduled appointments continue to work with RN Care Manager to address care management and care coordination needs related to  DMII and pain as evidenced by adherence to CM Team Scheduled appointments through collaboration with RN Care manager, provider, and care team.   Interventions: Evaluation of current treatment plan related to  self management and patient's adherence to plan as established by provider   Diabetes Interventions:  (Status:  Goal on track:  NO.) Long Term Goal Assessed patient's understanding of A1c goal: <6.5% Provided education to patient about basic DM disease process. A1C has increased to 7.1 during last check. Patient states she has not been following her diet as closely. No fasting glucose this morning reported,  stated she did not take. She reports her fasting blood sugar yesterday morning is 132. Lowest 127 & highest 143. She is checking her blood sugar once daily. RNCM reviewed with patient the potential complications if she does not get her diabetes under control. Patient scheduled for total thyroidectomy on 01-08-2024.  Reviewed medications with patient and discussed importance of medication adherence. Reports compliance with all medications Counseled on importance of regular laboratory monitoring as prescribed Discussed plans with patient for ongoing care management follow up and provided patient with direct contact information for care management team Review of patient status, including review of consultants reports, relevant laboratory and other test results, and medications completed Assessed social determinant of health barriers  Lab Results  Component Value Date   HGBA1C 7.1 (A) 12/09/2023    Pain Interventions:  (Status:  Goal on track:  Yes.) Long Term Goal Pain assessment performed. Reports 8/10 pain this morning. Reports no improvement with medication regimen. RNCM reviewed use of heating pad, TENS unit, massage for non pharmacological pain relief strategies. Medications reviewed Reviewed provider established plan for pain management Discussed importance of adherence to all scheduled medical appointments Counseled on the importance of reporting any/all new or changed pain symptoms or management strategies to pain management provider Advised patient to report to care team affect of pain on daily activities Discussed use of relaxation techniques and/or diversional activities to assist with pain reduction (distraction, imagery, relaxation, massage, acupressure, TENS, heat, and cold application Reviewed with patient prescribed pharmacological and nonpharmacological pain relief strategies Screening for signs and symptoms of depression related to chronic disease state  Patient Goals/Self-Care  Activities: Take all medications as prescribed Attend all scheduled provider appointments Call pharmacy for medication refills 3-7 days in advance of running out of medications Perform all self care activities independently  Call provider office for new concerns or questions  call the Suicide and Crisis Lifeline: 988 call the [place]  National Suicide Prevention Lifeline: 820-605-4690 or TTY: 5064596174 TTY 8700884161) to talk to a trained counselor call 1-800-273-TALK (toll free, 24 hour hotline) go to [facility] [address], [place] [phone]) call 911 if experiencing a Mental Health or Behavioral Health Crisis  check feet daily for cuts, sores or redness take the blood sugar log to all doctor visits manage portion size wear comfortable, cotton socks wear comfortable, well-fitting shoes  Follow Up Plan:  Telephone follow up appointment with care management team member scheduled for:  02-04-2024 at 11:45 am           Consent to Services:  Patient was given information about care management services, agreed to services, and gave verbal consent to participate.   Plan: Telephone follow up appointment with care management team member scheduled for:02-04-2024 at 11:45 am Rosina Forte, BSN RN RN Care Manager  [facility]  Ambulatory Care Management  Direct Number: [phone]

## 2023-12-31 NOTE — Patient Instructions (Signed)
 Visit Information  Thank you for taking time to visit with me today. Please don't hesitate to contact me if I can be of assistance to you before our next scheduled telephone appointment.  Following are the goals we discussed today:   Goals Addressed             This Visit's Progress    RNCM Care Managment Expected Outcome: Monitor, Self-Manage and Reduce Symptoms of: Diabetes, Pain       Current Barriers:  Knowledge Deficits related to plan of care for management of DMII and Pain  Chronic Disease Management support and education needs related to DMII and Pain   RNCM Clinical Goal(s):  Patient will verbalize basic understanding of  DMII and Pain disease process and self health management plan as evidenced by verbal explanation, recognizing symptoms, lifestyle modifications and daily monitoring take all medications exactly as prescribed and will call provider for medication related questions as evidenced by being compliant with all medications attend all scheduled medical appointments: with primary care provider and specialist as evidenced by keeping all scheduled appointments continue to work with RN Care Manager to address care management and care coordination needs related to  DMII and pain as evidenced by adherence to CM Team Scheduled appointments through collaboration with RN Care manager, provider, and care team.   Interventions: Evaluation of current treatment plan related to  self management and patient's adherence to plan as established by provider   Diabetes Interventions:  (Status:  Goal on track:  NO.) Long Term Goal Assessed patient's understanding of A1c goal: <6.5% Provided education to patient about basic DM disease process. A1C has increased to 7.1 during last check. Patient states she has not been following her diet as closely. No fasting glucose this morning reported, stated she did not take. She reports her fasting blood sugar yesterday morning is 132. Lowest 127 &  highest 143. She is checking her blood sugar once daily. RNCM reviewed with patient the potential complications if she does not get her diabetes under control. Patient scheduled for total thyroidectomy on 01-08-2024.  Reviewed medications with patient and discussed importance of medication adherence. Reports compliance with all medications Counseled on importance of regular laboratory monitoring as prescribed Discussed plans with patient for ongoing care management follow up and provided patient with direct contact information for care management team Review of patient status, including review of consultants reports, relevant laboratory and other test results, and medications completed Assessed social determinant of health barriers  Lab Results  Component Value Date   HGBA1C 7.1 (A) 12/09/2023    Pain Interventions:  (Status:  Goal on track:  Yes.) Long Term Goal Pain assessment performed. Reports 8/10 pain this morning. Reports no improvement with medication regimen. RNCM reviewed use of heating pad, TENS unit, massage for non pharmacological pain relief strategies. Medications reviewed Reviewed provider established plan for pain management Discussed importance of adherence to all scheduled medical appointments Counseled on the importance of reporting any/all new or changed pain symptoms or management strategies to pain management provider Advised patient to report to care team affect of pain on daily activities Discussed use of relaxation techniques and/or diversional activities to assist with pain reduction (distraction, imagery, relaxation, massage, acupressure, TENS, heat, and cold application Reviewed with patient prescribed pharmacological and nonpharmacological pain relief strategies Screening for signs and symptoms of depression related to chronic disease state   Patient Goals/Self-Care Activities: Take all medications as prescribed Attend all scheduled provider appointments Call  pharmacy for medication refills  3-7 days in advance of running out of medications Perform all self care activities independently  Call provider office for new concerns or questions  call the Suicide and Crisis Lifeline: 988 call the [place]  National Suicide Prevention Lifeline: 4194210929 or TTY: (754) 666-1618 TTY [phone]) to talk to a trained counselor call 1-800-273-TALK (toll free, 24 hour hotline) go to [facility] [address] [place] [phone]) call 911 if experiencing a Mental Health or Behavioral Health Crisis  check feet daily for cuts, sores or redness take the blood sugar log to all doctor visits manage portion size wear comfortable, cotton socks wear comfortable, well-fitting shoes  Follow Up Plan:  Telephone follow up appointment with care management team member scheduled for:  02-04-2024 at 11:45 am           Our next appointment is by telephone on 02-04-2024 at 11:45 am  Please call the care guide team at [phone] if you need to cancel or reschedule your appointment.   If you are experiencing a Mental Health or Behavioral Health Crisis or need someone to talk to, please call the Suicide and Crisis Lifeline: 988 call the [place]  National Suicide Prevention Lifeline: (951)131-4471 or TTY: 8472073328 TTY 330-223-6100) to talk to a trained counselor call 1-800-273-TALK (toll free, 24 hour hotline) go to [facility] [address] [place] [phone])   Patient verbalizes understanding of instructions and care plan provided today and agrees to view in MyChart. Active MyChart status and patient understanding of how to access instructions and care plan via MyChart confirmed with patient.     Telephone follow up appointment with care management team member scheduled for:02-04-2024 at 11:45 am  Rosina Forte, BSN RN RN Care Manager  [facility]  Ambulatory Care Management   Direct Number: [phone]

## 2024-01-01 NOTE — Progress Notes (Addendum)
 COVID Vaccine Completed:  Yes  Date of COVID positive in last 90 days:  No  PCP -  Dr. Delores Cardiologist - N/A  Chest x-ray - N/A EKG - 06-06-23 Epic Stress Test - N/A ECHO - 07-31-18 Epic Cardiac Cath - N/A Pacemaker/ICD device last checked: Spinal Cord Stimulator: N/A  Bowel Prep - N/A  Sleep Study -  Yes, neg sleep apnea CPAP - No  Fasting Blood Sugar - 127 to 140 Checks Blood Sugar - 1 times a day  Semaglutide  Last dose of GLP1 agonist-  12-27-23 GLP1 instructions:  Hold until after surgery    Jardiance  Last dose of SGLT-2 inhibitors-  01-04-24 SGLT-2 instructions:  Hold 3 days before surgery   Blood Thinner Instructions:  N/A Aspirin  Instructions: Last Dose:  Activity level:  Can go up a flight of stairs and perform activities of daily living without stopping and without symptoms of chest pain or shortness of breath.  Anesthesia review:  N/A  Patient denies shortness of breath, fever, cough and chest pain at PAT appointment  Patient verbalized understanding of instructions that were given to them at the PAT appointment. Patient was also instructed that they will need to review over the PAT instructions again at home before surgery.

## 2024-01-01 NOTE — Patient Instructions (Addendum)
 SURGICAL WAITING ROOM VISITATION Patients having surgery or a procedure may have no more than 2 support people in the waiting area - these visitors may rotate.    Children under the age of 7 must have an adult with them who is not the patient.  If the patient needs to stay at the hospital during part of their recovery, the visitor guidelines for inpatient rooms apply. Pre-op nurse will coordinate an appropriate time for 1 support person to accompany patient in pre-op.  This support person may not rotate.    Please refer to the [facility] website for the visitor guidelines for Inpatients (after your surgery is over and you are in a regular room).       Your procedure is scheduled on: 01-08-24   Report to [facility] Main Entrance    Report to admitting at 7:15 AM   Call this number if you have problems the morning of surgery [phone]   Do not eat food :After Midnight.   After Midnight you may have the following liquids until 6:30 AM DAY OF SURGERY  Water Non-Citrus Juices (without pulp, NO RED-Apple, White grape, White cranberry) Black Coffee (NO MILK/CREAM OR CREAMERS, sugar ok)  Clear Tea (NO MILK/CREAM OR CREAMERS, sugar ok) regular and decaf                             Plain Jell-O (NO RED)                                           Fruit ices (not with fruit pulp, NO RED)                                     Popsicles (NO RED)                                                               Sports drinks like Gatorade (NO RED)                        If you have questions, please contact your surgeon's office.   FOLLOW ANY ADDITIONAL PRE OP INSTRUCTIONS YOU RECEIVED FROM YOUR SURGEON'S OFFICE!!!     Oral Hygiene is also important to reduce your risk of infection.                                    Remember - BRUSH YOUR TEETH THE MORNING OF SURGERY WITH YOUR REGULAR TOOTHPASTE   Do NOT smoke after Midnight   Take these medicines the morning of surgery with A SIP  OF WATER:   Aripiprazole   Atorvastatin   Bupropion   Gabapentin   Claritin   Venlafaxine   Tylenol  if needed  Stop all vitamins and herbal supplements 7 days before surgery  How to Manage Your Diabetes Before and After Surgery  Why is it important to control my blood sugar before and after surgery? Improving blood sugar levels before and after surgery helps  healing and can limit problems. A way of improving blood sugar control is eating a healthy diet by:  Eating less sugar and carbohydrates  Increasing activity/exercise  Talking with your doctor about reaching your blood sugar goals High blood sugars (greater than 180 mg/dL) can raise your risk of infections and slow your recovery, so you will need to focus on controlling your diabetes during the weeks before surgery. Make sure that the doctor who takes care of your diabetes knows about your planned surgery including the date and location.  How do I manage my blood sugar before surgery? Check your blood sugar at least 4 times a day, starting 2 days before surgery, to make sure that the level is not too high or low. Check your blood sugar the morning of your surgery when you wake up and every 2 hours until you get to the Short Stay unit. If your blood sugar is less than 70 mg/dL, you will need to treat for low blood sugar: Do not take insulin . Treat a low blood sugar (less than 70 mg/dL) with  cup of clear juice (cranberry or apple), 4 glucose tablets, OR glucose gel. Recheck blood sugar in 15 minutes after treatment (to make sure it is greater than 70 mg/dL). If your blood sugar is not greater than 70 mg/dL on recheck, call [phone] for further instructions. Report your blood sugar to the short stay nurse when you get to Short Stay.  If you are admitted to the hospital after surgery: Your blood sugar will be checked by the staff and you will probably be given insulin  after surgery (instead of oral diabetes medicines) to make sure  you have good blood sugar levels. The goal for blood sugar control after surgery is 80-180 mg/dL.   WHAT DO I DO ABOUT MY DIABETES MEDICATION?  Do not take oral diabetes medicines (pills) the morning of surgery.  Hold Jardiance  3 days before surgery (do not take after 01-04-24).      Hold Ozempic  7 days before surgery (do not take after 12-31-23).  DO NOT TAKE THE FOLLOWING 7 DAYS PRIOR TO SURGERY: Ozempic , Wegovy , Rybelsus  (Semaglutide ), Byetta (exenatide), Bydureon (exenatide ER), Victoza, Saxenda (liraglutide), or Trulicity (dulaglutide) Mounjaro  (Tirzepatide ) Adlyxin (Lixisenatide), Polyethylene Glycol Loxenatide.  Reviewed and Endorsed by [facility] Patient Education Committee, August 2015                              You may not have any metal on your body including hair pins, jewelry, and body piercing             Do not wear make-up, lotions, powders, perfumes or deodorant  Do not wear nail polish including gel and S&S, artificial/acrylic nails, or any other type of covering on natural nails including finger and toenails. If you have artificial nails, gel coating, etc. that needs to be removed by a nail salon please have this removed prior to surgery or surgery may need to be canceled/ delayed if the surgeon/ anesthesia feels like they are unable to be safely monitored.   Do not shave  48 hours prior to surgery.            Do not bring valuables to the hospital. [facility] IS NOT RESPONSIBLE   FOR VALUABLES.   Contacts, dentures or bridgework may not be worn into surgery.   Bring small overnight bag day of surgery.   DO NOT [facility] HOME MEDICATIONS  TO THE HOSPITAL. PHARMACY WILL DISPENSE MEDICATIONS LISTED ON YOUR MEDICATION LIST TO YOU DURING YOUR ADMISSION IN THE HOSPITAL!              Please read over the following fact sheets you were given: IF YOU HAVE QUESTIONS ABOUT YOUR PRE-OP INSTRUCTIONS PLEASE CALL [phone] [facility]  If you received a COVID test during your  pre-op visit  it is requested that you wear a mask when out in public, stay away from anyone that may not be feeling well and notify your surgeon if you develop symptoms. If you test positive for Covid or have been in contact with anyone that has tested positive in the last 10 days please notify you surgeon.   - Preparing for Surgery Before surgery, you can play an important role.  Because skin is not sterile, your skin needs to be as free of germs as possible.  You can reduce the number of germs on your skin by washing with CHG (chlorahexidine gluconate) soap before surgery.  CHG is an antiseptic cleaner which kills germs and bonds with the skin to continue killing germs even after washing. Please DO NOT use if you have an allergy to CHG or antibacterial soaps.  If your skin becomes reddened/irritated stop using the CHG and inform your nurse when you arrive at Short Stay. Do not shave (including legs and underarms) for at least 48 hours prior to the first CHG shower.  You may shave your face/neck.  Please follow these instructions carefully:  1.  Shower with CHG Soap the night before surgery and the  morning of surgery.  2.  If you choose to wash your hair, wash your hair first as usual with your normal  shampoo.  3.  After you shampoo, rinse your hair and body thoroughly to remove the shampoo.                             4.  Use CHG as you would any other liquid soap.  You can apply chg directly to the skin and wash.  Gently with a scrungie or clean washcloth.  5.  Apply the CHG Soap to your body ONLY FROM THE NECK DOWN.   Do   not use on face/ open                           Wound or open sores. Avoid contact with eyes, ears mouth and   genitals (private parts).                       Wash face,  Genitals (private parts) with your normal soap.             6.  Wash thoroughly, paying special attention to the area where your    surgery  will be performed.  7.  Thoroughly rinse your body  with warm water from the neck down.  8.  DO NOT shower/wash with your normal soap after using and rinsing off the CHG Soap.                9.  Pat yourself dry with a clean towel.            10.  Wear clean pajamas.            11.  Place clean sheets on your bed the night of your  first shower and do not  sleep with pets. Day of Surgery : Do not apply any lotions/deodorants the morning of surgery.  Please wear clean clothes to the hospital/surgery center.  FAILURE TO FOLLOW THESE INSTRUCTIONS MAY RESULT IN THE CANCELLATION OF YOUR SURGERY  PATIENT SIGNATURE_________________________________  NURSE SIGNATURE__________________________________  ________________________________________________________________________

## 2024-01-02 ENCOUNTER — Encounter (HOSPITAL_COMMUNITY)
Admission: RE | Admit: 2024-01-02 | Discharge: 2024-01-02 | Disposition: A | Discharge status: Home / Self Care | Payer: Medicare PPO | Source: Ambulatory Visit | Attending: Surgery | Admitting: Surgery

## 2024-01-02 ENCOUNTER — Encounter (HOSPITAL_COMMUNITY): Payer: Self-pay

## 2024-01-02 ENCOUNTER — Other Ambulatory Visit: Payer: Self-pay

## 2024-01-02 ENCOUNTER — Ambulatory Visit (HOSPITAL_COMMUNITY): Payer: Medicare PPO | Admitting: Psychiatry

## 2024-01-02 ENCOUNTER — Other Ambulatory Visit: Payer: Self-pay | Admitting: Family Medicine

## 2024-01-02 VITALS — BP 128/85 | HR 93 | Temp 97.7°F | Resp 16 | Ht 64.0 in | Wt 235.2 lb

## 2024-01-02 DIAGNOSIS — E119 Type 2 diabetes mellitus without complications: Secondary | ICD-10-CM | POA: Insufficient documentation

## 2024-01-02 DIAGNOSIS — Z01812 Encounter for preprocedural laboratory examination: Secondary | ICD-10-CM | POA: Insufficient documentation

## 2024-01-02 DIAGNOSIS — Z01818 Encounter for other preprocedural examination: Secondary | ICD-10-CM

## 2024-01-02 HISTORY — DX: Other complications of anesthesia, initial encounter: T88.59XA

## 2024-01-02 LAB — CBC
HCT: 38.1 % (ref 36.0–46.0)
Hemoglobin: 12.8 g/dL (ref 12.0–15.0)
MCH: 27.8 pg (ref 26.0–34.0)
MCHC: 33.6 g/dL (ref 30.0–36.0)
MCV: 82.6 fL (ref 80.0–100.0)
Platelets: 442 10*3/uL — ABNORMAL HIGH (ref 150–400)
RBC: 4.61 MIL/uL (ref 3.87–5.11)
RDW: 16.6 % — ABNORMAL HIGH (ref 11.5–15.5)
WBC: 5.2 10*3/uL (ref 4.0–10.5)
nRBC: 0 % (ref 0.0–0.2)

## 2024-01-02 LAB — BASIC METABOLIC PANEL
Anion gap: 10 (ref 5–15)
BUN: 19 mg/dL (ref 6–20)
CO2: 25 mmol/L (ref 22–32)
Calcium: 9.4 mg/dL (ref 8.9–10.3)
Chloride: 105 mmol/L (ref 98–111)
Creatinine, Ser: 0.72 mg/dL (ref 0.44–1.00)
GFR, Estimated: >60 mL/min (ref >60)
Glucose, Bld: 114 mg/dL — ABNORMAL HIGH (ref 70–99)
Potassium: 4 mmol/L (ref 3.5–5.1)
Sodium: 140 mmol/L (ref 135–145)

## 2024-01-02 LAB — GLUCOSE, CAPILLARY: Glucose-Capillary: 117 mg/dL — ABNORMAL HIGH (ref 70–99)

## 2024-01-03 ENCOUNTER — Encounter (HOSPITAL_COMMUNITY): Payer: Self-pay | Admitting: Surgery

## 2024-01-03 DIAGNOSIS — E042 Nontoxic multinodular goiter: Secondary | ICD-10-CM | POA: Diagnosis present

## 2024-01-03 NOTE — H&P (Signed)
 PROVIDER: Genavie Boettger Jenny SPINNER, MD   Chief Complaint: Follow-up (Multinodular thyroid  goiter)  History of Present Illness:  Patient returns to my practice for follow-up. Patient has a known multinodular thyroid  goiter. She complains of mild progressive symptoms. She has undergone ultrasound evaluation as of October 07, 2023. This continues to show an enlarged right thyroid  lobe measuring 7.9 cm and an enlarged left thyroid  lobe measuring 6.1 cm. Dominant nodules are little change from prior examinations. No new or worrisome findings are identified. Patient continues to recovery from her cervical spine procedure performed by Dr. Malcolm. She is seeing him in follow-up tomorrow. Patient presents today to discuss thyroid  surgery. She is anxious to proceed with total thyroidectomy.   Review of Systems: A complete review of systems was obtained from the patient. I have reviewed this information and discussed as appropriate with the patient. See HPI as well for other ROS.  Review of Systems  Constitutional: Positive for malaise/fatigue.  HENT: Negative.  Eyes: Negative.  Respiratory: Positive for cough.  Cardiovascular: Negative.  Gastrointestinal:  Dysphagia  Genitourinary: Negative.  Musculoskeletal: Negative.  Skin: Negative.  Neurological: Negative.  Endo/Heme/Allergies: Negative.  Psychiatric/Behavioral: Negative.    Medical History: Past Medical History:  Diagnosis Date  Anxiety  Arthritis  History of cancer  Thyroid  disease   Patient Active Problem List  Diagnosis  Multiple thyroid  nodules  Enlarged thyroid    Past Surgical History:  Procedure Laterality Date  CESAREAN SECTION  1983  COLON SURGERY  2016  fibroid removal  2008  neck surgery  2012  REPEAT CESAREAN SECTION  1986  REPEAT CESAREAN SECTION  1988    Allergies  Allergen Reactions  Adhesive Tape-Silicones Unknown  Shellfish Containing Products Rash   Current Outpatient Medications on File  Prior to Visit  Medication Sig Dispense Refill  atorvastatin  (LIPITOR) 20 MG tablet Take 20 mg by mouth once daily  cyclobenzaprine  (FLEXERIL ) 5 MG tablet Take 5 mg by mouth 2 (two) times daily as needed  diclofenac  (VOLTAREN ) 1 % topical gel Apply 2 g topically 4 (four) times daily  empagliflozin  (JARDIANCE ) 10 mg tablet Take 1 tablet by mouth once daily  gabapentin  (NEURONTIN ) 300 MG capsule Take 2-3 capsules before bed  metFORMIN  (GLUCOPHAGE -XR) 500 MG XR tablet Take 1,000 mg by mouth 2 (two) times daily  venlafaxine  (EFFEXOR -XR) 150 MG XR capsule  ARIPiprazole  (ABILIFY ) 10 MG tablet Take 1 tablet by mouth once daily  buPROPion  (WELLBUTRIN  XL) 150 MG XL tablet Take by mouth   No current facility-administered medications on file prior to visit.   Family History  Problem Relation Age of Onset  Deep vein thrombosis (DVT or abnormal blood clot formation) Mother  Obesity Sister  Colon cancer Brother    Social History   Tobacco Use  Smoking Status Never  Smokeless Tobacco Never    Social History   Socioeconomic History  Marital status: Married  Tobacco Use  Smoking status: Never  Smokeless tobacco: Never  Vaping Use  Vaping status: Never Used  Substance and Sexual Activity  Alcohol use: Never  Drug use: Never   Social Drivers of Corporate Investment Banker Strain: Low Risk (07/28/2023)  Received from [facility]  Overall Financial Resource Strain (CARDIA)  Difficulty of Paying Living Expenses: Not hard at all  Food Insecurity: No Food Insecurity (07/28/2023)  Received from [facility]  Hunger Vital Sign  Worried About Running Out of Food in the Last Year: Never true  Ran Out of Food in the  Last Year: Never true  Transportation Needs: No Transportation Needs (07/28/2023)  Received from [facility] - Transportation  Lack of Transportation (Medical): No  Lack of Transportation (Non-Medical): No  Physical Activity: Inactive (11/05/2023)  Received from [facility]   Exercise Vital Sign  Days of Exercise per Week: 0 days  Minutes of Exercise per Session: 0 min  Stress: No Stress Concern Present (07/28/2023)  Received from [facility] - Occupational Stress Questionnaire  Feeling of Stress : Only a little  Social Connections: Moderately Integrated (07/28/2023)  Received from [facility]  Social Connection and Isolation Panel [NHANES]  Frequency of Communication with Friends and Family: More than three times a week  Frequency of Social Gatherings with Friends and Family: Three times a week  Attends Religious Services: More than 4 times per year  Active Member of Clubs or Organizations: No  Attends Banker Meetings: Never  Marital Status: Married   Objective:   Vitals:  PainSc: 1  PainLoc: Throat   There is no height or weight on file to calculate BMI.  Physical Exam   GENERAL APPEARANCE Comfortable, no acute issues Development: normal Gross deformities: none  SKIN Rash, lesions, ulcers: none Induration, erythema: none Nodules: none palpable  EYES Conjunctiva and lids: normal Pupils: equal  EARS, NOSE, MOUTH, THROAT External ears: no lesion or deformity External nose: no lesion or deformity Hearing: grossly normal  NECK Symmetric: yes Trachea: midline Thyroid : Right thyroid  lobe is larger than the left. Both lobes are relatively soft, diffusely nodular, and nontender. There is no associated lymphadenopathy.  CHEST/CV Not assessed  ABDOMEN Not assessed  GENITOURINARY/RECTAL Not assessed  MUSCULOSKELETAL Station and gait: normal Digits and nails: no clubbing or cyanosis Muscle strength: grossly normal all extremities Deformity: none  LYMPHATIC Cervical: none palpable Supraclavicular: none palpable  PSYCHIATRIC Oriented to person, place, and time: yes Mood and affect: normal for situation Judgment and insight: appropriate for situation   Assessment and Plan:    Multiple thyroid  nodules Enlarged thyroid   Patient continues to remain mildly symptomatic with compressive symptoms due to a multinodular thyroid  goiter. She is undergoing evaluation including a swallowing study by speech-language pathology. Her recent ultrasound continues to show an enlarged multinodular thyroid  gland.  Today we again discussed her symptoms. I again told her that thyroidectomy may or may not improve all of her compressive symptoms. We discussed the size and location of the surgical incision. We discussed the risk and benefits of surgery including the risk of recurrent laryngeal nerve injury and injury to parathyroid glands. We discussed the hospital stay to be anticipated. We discussed her postoperative recovery. We discussed the need for lifelong thyroid  hormone replacement. The patient understands and wishes to proceed with surgery.  Patient will discuss this again tomorrow with her neurosurgeon. Unless there are any concerns at that time, we will proceed with scheduling her procedure at a time convenient for the patient.  Jenny Spinner, MD [facility] practice Office: [phone]

## 2024-01-08 ENCOUNTER — Other Ambulatory Visit: Payer: Self-pay

## 2024-01-08 ENCOUNTER — Ambulatory Visit (HOSPITAL_COMMUNITY): Payer: Medicare PPO | Admitting: Anesthesiology

## 2024-01-08 ENCOUNTER — Ambulatory Visit (HOSPITAL_COMMUNITY)
Admission: RE | Admit: 2024-01-08 | Discharge: 2024-01-09 | Disposition: A | Discharge status: Home / Self Care | Payer: Medicare PPO | Source: Ambulatory Visit | Attending: Surgery | Admitting: Surgery

## 2024-01-08 ENCOUNTER — Encounter (HOSPITAL_COMMUNITY)
Admission: RE | Disposition: A | Discharge status: Home / Self Care | Payer: Self-pay | Source: Ambulatory Visit | Attending: Surgery

## 2024-01-08 ENCOUNTER — Encounter (HOSPITAL_COMMUNITY): Payer: Self-pay | Admitting: Surgery

## 2024-01-08 ENCOUNTER — Ambulatory Visit (HOSPITAL_BASED_OUTPATIENT_CLINIC_OR_DEPARTMENT_OTHER): Payer: Medicare PPO | Admitting: Anesthesiology

## 2024-01-08 DIAGNOSIS — M1990 Unspecified osteoarthritis, unspecified site: Secondary | ICD-10-CM | POA: Diagnosis not present

## 2024-01-08 DIAGNOSIS — E890 Postprocedural hypothyroidism: Secondary | ICD-10-CM | POA: Diagnosis present

## 2024-01-08 DIAGNOSIS — Z85038 Personal history of other malignant neoplasm of large intestine: Secondary | ICD-10-CM | POA: Insufficient documentation

## 2024-01-08 DIAGNOSIS — E042 Nontoxic multinodular goiter: Secondary | ICD-10-CM | POA: Diagnosis present

## 2024-01-08 DIAGNOSIS — Z800 Family history of malignant neoplasm of digestive organs: Secondary | ICD-10-CM | POA: Insufficient documentation

## 2024-01-08 DIAGNOSIS — E66813 Obesity, class 3: Secondary | ICD-10-CM | POA: Insufficient documentation

## 2024-01-08 DIAGNOSIS — F32A Depression, unspecified: Secondary | ICD-10-CM | POA: Diagnosis not present

## 2024-01-08 DIAGNOSIS — Z6841 Body Mass Index (BMI) 40.0 and over, adult: Secondary | ICD-10-CM | POA: Diagnosis not present

## 2024-01-08 DIAGNOSIS — F419 Anxiety disorder, unspecified: Secondary | ICD-10-CM | POA: Diagnosis not present

## 2024-01-08 DIAGNOSIS — E119 Type 2 diabetes mellitus without complications: Secondary | ICD-10-CM | POA: Diagnosis not present

## 2024-01-08 DIAGNOSIS — E049 Nontoxic goiter, unspecified: Secondary | ICD-10-CM | POA: Diagnosis not present

## 2024-01-08 DIAGNOSIS — Z7984 Long term (current) use of oral hypoglycemic drugs: Secondary | ICD-10-CM | POA: Insufficient documentation

## 2024-01-08 HISTORY — PX: THYROIDECTOMY: SHX17

## 2024-01-08 LAB — GLUCOSE, CAPILLARY
Glucose-Capillary: 126 mg/dL — ABNORMAL HIGH (ref 70–99)
Glucose-Capillary: 156 mg/dL — ABNORMAL HIGH (ref 70–99)

## 2024-01-08 SURGERY — THYROIDECTOMY
Anesthesia: General

## 2024-01-08 MED ORDER — FENTANYL CITRATE (PF) 100 MCG/2ML IJ SOLN
INTRAMUSCULAR | Status: AC
  Filled 2024-01-08: qty 2

## 2024-01-08 MED ORDER — LIDOCAINE HCL (PF) 2 % IJ SOLN
INTRAMUSCULAR | Status: AC
  Filled 2024-01-08: qty 5

## 2024-01-08 MED ORDER — MIDAZOLAM HCL 2 MG/2ML IJ SOLN
INTRAMUSCULAR | Status: AC
Start: 2024-01-08 — End: ?
  Filled 2024-01-08: qty 2

## 2024-01-08 MED ORDER — OXYCODONE HCL 5 MG PO TABS
5.0000 mg | ORAL_TABLET | ORAL | Status: DC | PRN
  Administered 2024-01-08: 10 mg via ORAL
  Filled 2024-01-08: qty 2

## 2024-01-08 MED ORDER — FENTANYL CITRATE PF 50 MCG/ML IJ SOSY
25.0000 ug | PREFILLED_SYRINGE | INTRAMUSCULAR | Status: DC | PRN
  Administered 2024-01-08 (×2): 50 ug via INTRAVENOUS

## 2024-01-08 MED ORDER — ONDANSETRON HCL 4 MG/2ML IJ SOLN
4.0000 mg | Freq: Four times a day (QID) | INTRAMUSCULAR | Status: DC | PRN
  Administered 2024-01-09: 4 mg via INTRAVENOUS
  Filled 2024-01-08: qty 2

## 2024-01-08 MED ORDER — CHLORHEXIDINE GLUCONATE CLOTH 2 % EX PADS: 6.0000 | MEDICATED_PAD | Freq: Once | CUTANEOUS | Status: DC

## 2024-01-08 MED ORDER — FENTANYL CITRATE PF 50 MCG/ML IJ SOSY
PREFILLED_SYRINGE | INTRAMUSCULAR | Status: AC
  Filled 2024-01-08: qty 1

## 2024-01-08 MED ORDER — LEVOTHYROXINE SODIUM 100 MCG PO TABS: 100.0000 ug | ORAL_TABLET | Freq: Every day | ORAL | 2 refills | Status: DC

## 2024-01-08 MED ORDER — PROPOFOL 10 MG/ML IV BOLUS
INTRAVENOUS | Status: AC
Start: 2024-01-08 — End: ?
  Filled 2024-01-08: qty 20

## 2024-01-08 MED ORDER — ROCURONIUM BROMIDE 10 MG/ML (PF) SYRINGE
PREFILLED_SYRINGE | INTRAVENOUS | Status: AC
Start: 2024-01-08 — End: ?
  Filled 2024-01-08: qty 10

## 2024-01-08 MED ORDER — BUPROPION HCL ER (XL) 150 MG PO TB24
450.0000 mg | ORAL_TABLET | Freq: Every day | ORAL | Status: DC
  Administered 2024-01-08 – 2024-01-09 (×2): 450 mg via ORAL
  Filled 2024-01-08 (×2): qty 3

## 2024-01-08 MED ORDER — ACETAMINOPHEN 650 MG RE SUPP: 650.0000 mg | Freq: Four times a day (QID) | RECTAL | Status: DC | PRN

## 2024-01-08 MED ORDER — ONDANSETRON HCL 4 MG/2ML IJ SOLN
INTRAMUSCULAR | Status: AC
  Filled 2024-01-08: qty 2

## 2024-01-08 MED ORDER — LIDOCAINE 2% (20 MG/ML) 5 ML SYRINGE
INTRAMUSCULAR | Status: DC | PRN
  Administered 2024-01-08: 60 mg via INTRAVENOUS

## 2024-01-08 MED ORDER — FENTANYL CITRATE (PF) 250 MCG/5ML IJ SOLN
INTRAMUSCULAR | Status: AC
  Filled 2024-01-08: qty 5

## 2024-01-08 MED ORDER — DEXAMETHASONE SODIUM PHOSPHATE 10 MG/ML IJ SOLN
INTRAMUSCULAR | Status: AC
  Filled 2024-01-08: qty 1

## 2024-01-08 MED ORDER — TRAZODONE HCL 50 MG PO TABS: 50.0000 mg | ORAL_TABLET | Freq: Every evening | ORAL | Status: DC | PRN

## 2024-01-08 MED ORDER — HYDROCORTISONE SOD SUC (PF) 100 MG IJ SOLR
100.0000 mg | Freq: Four times a day (QID) | INTRAMUSCULAR | Status: DC
  Administered 2024-01-08 – 2024-01-09 (×3): 100 mg via INTRAVENOUS
  Filled 2024-01-08 (×3): qty 2

## 2024-01-08 MED ORDER — FENTANYL CITRATE (PF) 250 MCG/5ML IJ SOLN
INTRAMUSCULAR | Status: DC | PRN
  Administered 2024-01-08 (×3): 100 ug via INTRAVENOUS
  Administered 2024-01-08: 50 ug via INTRAVENOUS

## 2024-01-08 MED ORDER — OXYCODONE HCL 5 MG PO TABS
5.0000 mg | ORAL_TABLET | Freq: Once | ORAL | Status: AC | PRN
  Administered 2024-01-08: 5 mg via ORAL

## 2024-01-08 MED ORDER — BUPROPION HCL ER (XL) 150 MG PO TB24: 300.0000 mg | ORAL_TABLET | Freq: Every day | ORAL | Status: DC

## 2024-01-08 MED ORDER — EMPAGLIFLOZIN 10 MG PO TABS
10.0000 mg | ORAL_TABLET | Freq: Every day | ORAL | Status: DC
  Administered 2024-01-09: 10 mg via ORAL
  Filled 2024-01-08: qty 1

## 2024-01-08 MED ORDER — 0.9 % SODIUM CHLORIDE (POUR BTL) OPTIME
TOPICAL | Status: DC | PRN
  Administered 2024-01-08: 1000 mL

## 2024-01-08 MED ORDER — HEMOSTATIC AGENTS (NO CHARGE) OPTIME
TOPICAL | Status: DC | PRN
  Administered 2024-01-08: 1

## 2024-01-08 MED ORDER — VENLAFAXINE HCL ER 37.5 MG PO CP24
187.5000 mg | ORAL_CAPSULE | Freq: Every day | ORAL | Status: DC
  Administered 2024-01-08 – 2024-01-09 (×2): 187.5 mg via ORAL
  Filled 2024-01-08 (×2): qty 5

## 2024-01-08 MED ORDER — SODIUM CHLORIDE 0.45 % IV SOLN: INTRAVENOUS | Status: DC

## 2024-01-08 MED ORDER — TRAMADOL HCL 50 MG PO TABS: 50.0000 mg | ORAL_TABLET | Freq: Four times a day (QID) | ORAL | Status: DC | PRN

## 2024-01-08 MED ORDER — PROPOFOL 10 MG/ML IV BOLUS
INTRAVENOUS | Status: DC | PRN
  Administered 2024-01-08: 150 mg via INTRAVENOUS

## 2024-01-08 MED ORDER — GABAPENTIN 100 MG PO CAPS
300.0000 mg | ORAL_CAPSULE | Freq: Two times a day (BID) | ORAL | Status: DC
  Administered 2024-01-08 – 2024-01-09 (×2): 300 mg via ORAL
  Filled 2024-01-08 (×2): qty 3

## 2024-01-08 MED ORDER — ONDANSETRON HCL 4 MG/2ML IJ SOLN
INTRAMUSCULAR | Status: DC | PRN
  Administered 2024-01-08: 4 mg via INTRAVENOUS

## 2024-01-08 MED ORDER — ORAL CARE MOUTH RINSE: 15.0000 mL | Freq: Once | OROMUCOSAL | Status: AC

## 2024-01-08 MED ORDER — ONDANSETRON HCL 4 MG/2ML IJ SOLN: 4.0000 mg | Freq: Four times a day (QID) | INTRAMUSCULAR | Status: DC | PRN

## 2024-01-08 MED ORDER — TRAMADOL HCL 50 MG PO TABS: 50.0000 mg | ORAL_TABLET | Freq: Four times a day (QID) | ORAL | 0 refills | Status: DC | PRN

## 2024-01-08 MED ORDER — CALCIUM CARBONATE 1250 (500 CA) MG PO TABS
2.0000 | ORAL_TABLET | Freq: Three times a day (TID) | ORAL | Status: DC
  Administered 2024-01-08 – 2024-01-09 (×2): 2500 mg via ORAL
  Filled 2024-01-08 (×2): qty 2

## 2024-01-08 MED ORDER — FENTANYL CITRATE PF 50 MCG/ML IJ SOSY
PREFILLED_SYRINGE | INTRAMUSCULAR | Status: AC
  Administered 2024-01-08: 50 ug via INTRAVENOUS
  Filled 2024-01-08: qty 1

## 2024-01-08 MED ORDER — CHLORHEXIDINE GLUCONATE 0.12 % MT SOLN
15.0000 mL | Freq: Once | OROMUCOSAL | Status: AC
  Administered 2024-01-08: 15 mL via OROMUCOSAL

## 2024-01-08 MED ORDER — ONDANSETRON 4 MG PO TBDP: 4.0000 mg | ORAL_TABLET | Freq: Four times a day (QID) | ORAL | Status: DC | PRN

## 2024-01-08 MED ORDER — OXYCODONE HCL 5 MG/5ML PO SOLN: 5.0000 mg | Freq: Once | ORAL | Status: AC | PRN

## 2024-01-08 MED ORDER — METFORMIN HCL ER 500 MG PO TB24
1000.0000 mg | ORAL_TABLET | Freq: Two times a day (BID) | ORAL | Status: DC
  Administered 2024-01-08 – 2024-01-09 (×2): 1000 mg via ORAL
  Filled 2024-01-08 (×2): qty 2

## 2024-01-08 MED ORDER — ROCURONIUM BROMIDE 10 MG/ML (PF) SYRINGE
PREFILLED_SYRINGE | INTRAVENOUS | Status: DC | PRN
  Administered 2024-01-08: 50 mg via INTRAVENOUS
  Administered 2024-01-08: 30 mg via INTRAVENOUS
  Administered 2024-01-08: 20 mg via INTRAVENOUS

## 2024-01-08 MED ORDER — CALCIUM CARBONATE ANTACID 500 MG PO CHEW: 2.0000 | CHEWABLE_TABLET | Freq: Three times a day (TID) | ORAL | 1 refills | Status: AC

## 2024-01-08 MED ORDER — HYDROMORPHONE HCL 1 MG/ML IJ SOLN
1.0000 mg | INTRAMUSCULAR | Status: DC | PRN
  Administered 2024-01-08: 1 mg via INTRAVENOUS
  Filled 2024-01-08: qty 1

## 2024-01-08 MED ORDER — DEXAMETHASONE SODIUM PHOSPHATE 10 MG/ML IJ SOLN
INTRAMUSCULAR | Status: DC | PRN
  Administered 2024-01-08: 5 mg via INTRAVENOUS

## 2024-01-08 MED ORDER — OXYCODONE HCL 5 MG PO TABS
ORAL_TABLET | ORAL | Status: AC
  Filled 2024-01-08: qty 1

## 2024-01-08 MED ORDER — LACTATED RINGERS IV SOLN: INTRAVENOUS | Status: DC

## 2024-01-08 MED ORDER — MIDAZOLAM HCL 2 MG/2ML IJ SOLN
INTRAMUSCULAR | Status: DC | PRN
  Administered 2024-01-08: 2 mg via INTRAVENOUS

## 2024-01-08 MED ORDER — CEFAZOLIN SODIUM-DEXTROSE 2-4 GM/100ML-% IV SOLN
2.0000 g | INTRAVENOUS | Status: AC
  Administered 2024-01-08: 2 g via INTRAVENOUS
  Filled 2024-01-08: qty 100

## 2024-01-08 MED ORDER — SUGAMMADEX SODIUM 200 MG/2ML IV SOLN
INTRAVENOUS | Status: DC | PRN
  Administered 2024-01-08: 200 mg via INTRAVENOUS

## 2024-01-08 MED ORDER — INSULIN ASPART 100 UNIT/ML IJ SOLN: 0.0000 [IU] | INTRAMUSCULAR | Status: DC | PRN

## 2024-01-08 MED ORDER — ACETAMINOPHEN 325 MG PO TABS
ORAL_TABLET | ORAL | Status: AC
  Administered 2024-01-08: 650 mg
  Filled 2024-01-08: qty 2

## 2024-01-08 MED ORDER — ACETAMINOPHEN 325 MG PO TABS: 650.0000 mg | ORAL_TABLET | Freq: Four times a day (QID) | ORAL | Status: DC | PRN

## 2024-01-08 MED ORDER — ARIPIPRAZOLE 10 MG PO TABS
10.0000 mg | ORAL_TABLET | Freq: Every day | ORAL | Status: DC
Start: 2024-01-08 — End: 2024-01-09
  Administered 2024-01-08 – 2024-01-09 (×2): 10 mg via ORAL
  Filled 2024-01-08 (×2): qty 1

## 2024-01-08 SURGICAL SUPPLY — 27 items
ATTRACTOMAT 16X20 MAGNETIC DRP (DRAPES) ×1 IMPLANT
BAG COUNTER SPONGE SURGICOUNT (BAG) ×1 IMPLANT
BLADE SURG 15 STRL LF DISP TIS (BLADE) ×1 IMPLANT
CHLORAPREP W/TINT 26 (MISCELLANEOUS) ×1 IMPLANT
CLIP TI MEDIUM 6 (CLIP) ×2 IMPLANT
CLIP TI WIDE RED SMALL 6 (CLIP) ×2 IMPLANT
COVER SURGICAL LIGHT HANDLE (MISCELLANEOUS) ×1 IMPLANT
DERMABOND ADVANCED .7 DNX12 (GAUZE/BANDAGES/DRESSINGS) ×1 IMPLANT
DRAPE LAPAROTOMY T 98X78 PEDS (DRAPES) ×1 IMPLANT
DRAPE UTILITY XL STRL (DRAPES) ×1 IMPLANT
ELECT PENCIL ROCKER SW 15FT (MISCELLANEOUS) ×1 IMPLANT
ELECT REM PT RETURN 15FT ADLT (MISCELLANEOUS) ×1 IMPLANT
GAUZE 4X4 16PLY ~~LOC~~+RFID DBL (SPONGE) ×1 IMPLANT
GLOVE SURG ORTHO 8.0 STRL STRW (GLOVE) ×1 IMPLANT
GOWN STRL REUS W/ TWL XL LVL3 (GOWN DISPOSABLE) ×2 IMPLANT
HEMOSTAT SURGICEL 2X4 FIBR (HEMOSTASIS) ×1 IMPLANT
ILLUMINATOR WAVEGUIDE N/F (MISCELLANEOUS) ×1 IMPLANT
KIT BASIN OR (CUSTOM PROCEDURE TRAY) ×1 IMPLANT
KIT TURNOVER KIT A (KITS) IMPLANT
PACK BASIC VI WITH GOWN DISP (CUSTOM PROCEDURE TRAY) ×1 IMPLANT
SHEARS HARMONIC 9CM CVD (BLADE) ×1 IMPLANT
SUT MNCRL AB 4-0 PS2 18 (SUTURE) ×1 IMPLANT
SUT SILK 3 0 SH 30 (SUTURE) ×1 IMPLANT
SUT VIC AB 3-0 SH 18 (SUTURE) ×2 IMPLANT
SYR BULB IRRIG 60ML STRL (SYRINGE) ×1 IMPLANT
TOWEL OR 17X26 10 PK STRL BLUE (TOWEL DISPOSABLE) ×1 IMPLANT
TUBING CONNECTING 10 (TUBING) ×1 IMPLANT

## 2024-01-08 NOTE — Discharge Instructions (Signed)
[facility] - Dr. Todd Gerkin  THYROID & PARATHYROID SURGERY:  POST-OP INSTRUCTIONS  Always review the instruction sheet provided by the hospital nurse at discharge.  A prescription for pain medication may be sent to your pharmacy at the time of discharge.  Take your pain medication as prescribed.  If narcotic pain medicine is not needed, then you may take acetaminophen (Tylenol) or ibuprofen (Advil) as needed for pain or soreness.  Take your normal home medications as prescribed unless otherwise directed.  If you need a refill on your pain medication, please contact the office during regular business hours.  Prescriptions will not be processed by the office after 5:00PM or on weekends.  Start with a light diet upon arrival home, such as soup and crackers or toast.  Be sure to drink plenty of fluids.  Resume your normal diet the day after surgery.  Most patients will experience some swelling and bruising on the chest and neck area.  Ice packs will help for the first 48 hours after arriving home.  Swelling and bruising will take several days to resolve.   It is common to experience some constipation after surgery.  Increasing fluid intake and taking a stool softener (Colace) will usually help to prevent this problem.  A mild laxative (Milk of Magnesia or Miralax) should be taken according to package directions if there has been no bowel movement after 48 hours.  Dermabond glue covers your incision. This seals the wound and you may shower at any time. The Dermabond will remain in place for about a week.  You may gradually remove the glue when it loosens around the edges.  If you need to loosen the Dermabond for removal, apply a layer of Vaseline to the wound for 15 minutes and then remove with a Kleenex. Your sutures are under the skin and will not show - they will dissolve on their own.  You may resume light daily activities beginning the day after discharge (such as self-care,  walking, climbing stairs), gradually increasing activities as tolerated. You may have sexual intercourse when it is comfortable. Refrain from any heavy lifting or straining until approved by your doctor. You may drive when you no longer are taking prescription pain medication, you can comfortably wear a seatbelt, and you can safely maneuver your car and apply the brakes.  You will see your doctor in the office for a follow-up appointment approximately three weeks after your surgery.  Make sure that you call for this appointment within a day or two after you arrive home to insure a convenient appointment time. Please have any requested laboratory tests performed a few days prior to your office visit so that the results will be available at your follow up appointment.  WHEN TO CALL THE [facility] OFFICE: -- Fever greater than 101.5 -- Inability to urinate -- Nausea and/or vomiting - persistent -- Extreme swelling or bruising -- Continued bleeding from incision -- Increased pain, redness, or drainage from the incision -- Difficulty swallowing or breathing -- Muscle cramping or spasms -- Numbness or tingling in hands or around lips  The clinic staff is available to answer your questions during regular business hours.  Please don't hesitate to call and ask to speak to one of the nurses if you have concerns.  [facility] OFFICE: [phone] (24 hours)  Please sign up for MyChart accounts. This will allow you to communicate directly with my nurse or myself without having to call the office. It will also allow you   to view your test results. You will need to enroll in MyChart for my office ([facility]) and for the hospital ([facility]).  Todd Gerkin, MD [facility] practice 

## 2024-01-08 NOTE — Plan of Care (Signed)

## 2024-01-08 NOTE — Anesthesia Postprocedure Evaluation (Signed)
Anesthesia Post Note  Patient: El Vanwingerden Alcoser  Procedure(s) Performed: TOTAL THYROIDECTOMY     Patient location during evaluation: PACU Anesthesia Type: General Level of consciousness: awake and alert Pain management: pain level controlled Vital Signs Assessment: post-procedure vital signs reviewed and stable Respiratory status: spontaneous breathing, nonlabored ventilation, respiratory function stable and patient connected to nasal cannula oxygen Cardiovascular status: blood pressure returned to baseline and stable Postop Assessment: no apparent nausea or vomiting Anesthetic complications: yes   Encounter Notable Events  Notable Event Outcome Phase Comment  Difficult to intubate - expected  Intraprocedure Filed from anesthesia note documentation.    Last Vitals:  Vitals:   01/08/24 1545 01/08/24 1657  BP: (!) 165/102 (!) 136/97  Pulse: 74 78  Resp: 18 15  Temp: 36.6 C (!) 36.4 C  SpO2: 96% 96%    Last Pain:  Vitals:   01/08/24 2000  TempSrc:   PainSc: 2                  Tamera Pingley S

## 2024-01-08 NOTE — Anesthesia Procedure Notes (Signed)
Procedure Name: Intubation Date/Time: 01/08/2024 9:16 AM  Performed by: Elyn Peers, CRNAPre-anesthesia Checklist: Patient identified, Emergency Drugs available, Suction available, Patient being monitored and Timeout performed Patient Re-evaluated:Patient Re-evaluated prior to induction Oxygen Delivery Method: Circle system utilized Preoxygenation: Pre-oxygenation with 100% oxygen Induction Type: IV induction Ventilation: Mask ventilation without difficulty Laryngoscope Size: 3 and Glidescope Grade View: Grade I Tube type: Parker flex tip Tube size: 7.0 mm Number of attempts: 1 Airway Equipment and Method: Rigid stylet and Video-laryngoscopy Placement Confirmation: ETT inserted through vocal cords under direct vision, positive ETCO2 and breath sounds checked- equal and bilateral Secured at: 22 cm Tube secured with: Tape Dental Injury: Teeth and Oropharynx as per pre-operative assessment  Difficulty Due To: Difficulty was anticipated Comments: Elective glidescope intubation based on patient's history of difficult airway and glidescope use in the past.   Easy smooth intubation with full view of cords.    No damage to teeth/lips/oral airway.

## 2024-01-08 NOTE — Transfer of Care (Signed)
Immediate Anesthesia Transfer of Care Note  Patient: Jenny [name]  Procedure(s) Performed: TOTAL THYROIDECTOMY  Patient Location: PACU  Anesthesia Type:General  Level of Consciousness: awake, drowsy, and responds to stimulation  Airway & Oxygen Therapy: Patient Spontanous Breathing and Patient connected to face mask oxygen  Post-op Assessment: Report given to RN and Post -op Vital signs reviewed and stable  Post vital signs: Reviewed and stable  Last Vitals:  Vitals Value Taken Time  BP 148/95 01/08/24 1118  Temp    Pulse 76 01/08/24 1120  Resp 13 01/08/24 1120  SpO2 100 % 01/08/24 1120  Vitals shown include unfiled device data.  Last Pain:  Vitals:   01/08/24 0750  TempSrc: Oral  PainSc: 8       Patients Stated Pain Goal: 4 (01/08/24 0750)  Complications:  Encounter Notable Events  Notable Event Outcome Phase Comment  Difficult to intubate - expected  Intraprocedure Filed from anesthesia note documentation.

## 2024-01-08 NOTE — Anesthesia Preprocedure Evaluation (Signed)
Anesthesia Evaluation  Patient identified by MRN, date of birth, ID band Patient awake    Reviewed: Allergy & Precautions, H&P , NPO status , Patient's Chart, lab work & pertinent test results  Airway Mallampati: II   Neck ROM: full    Dental   Pulmonary neg pulmonary ROS   breath sounds clear to auscultation       Cardiovascular negative cardio ROS  Rhythm:regular Rate:Normal     Neuro/Psych  PSYCHIATRIC DISORDERS Anxiety Depression       GI/Hepatic H/o colon CA   Endo/Other  diabetes, Type 2  Class 3 obesity  Renal/GU      Musculoskeletal  (+) Arthritis ,    Abdominal   Peds  Hematology   Anesthesia Other Findings   Reproductive/Obstetrics                             Anesthesia Physical Anesthesia Plan  ASA: 2  Anesthesia Plan: General   Post-op Pain Management:    Induction: Intravenous  PONV Risk Score and Plan: 3 and Ondansetron, Dexamethasone, Midazolam and Treatment may vary due to age or medical condition  Airway Management Planned: Oral ETT  Additional Equipment:   Intra-op Plan:   Post-operative Plan: Extubation in OR  Informed Consent: I have reviewed the patients History and Physical, chart, labs and discussed the procedure including the risks, benefits and alternatives for the proposed anesthesia with the patient or authorized representative who has indicated his/her understanding and acceptance.     Dental advisory given  Plan Discussed with: CRNA, Anesthesiologist and Surgeon  Anesthesia Plan Comments:        Anesthesia Quick Evaluation

## 2024-01-08 NOTE — Op Note (Signed)
Procedure Note  Pre-operative Diagnosis:  multinodular thyroid goiter  Post-operative Diagnosis:  same  Surgeon:  Darnell Level, MD  Assistant:  none   Procedure:  Total thyroidectomy  Anesthesia:  General  Estimated Blood Loss:  75 cc  Drains: none         Specimen: thyroid to pathology  Indications:  Patient returns to my practice for follow-up. Patient has a known multinodular thyroid goiter. She complains of mild progressive symptoms. She has undergone ultrasound evaluation as of October 07, 2023. This continues to show an enlarged right thyroid lobe measuring 7.9 cm and an enlarged left thyroid lobe measuring 6.1 cm. Dominant nodules are little change from prior examinations. No new or worrisome findings are identified. Patient continues to recovery from her cervical spine procedure performed by Dr. Dutch Quint. She is seeing him in follow-up tomorrow. Patient presents today to discuss thyroid surgery. She is anxious to proceed with total thyroidectomy.   Procedure Details: Procedure was done in OR #1 at the [facility]. The patient was brought to the operating room and placed in a supine position on the operating room table. Following administration of general anesthesia, the patient was positioned and then prepped and draped in the usual aseptic fashion. After ascertaining that an adequate level of anesthesia had been achieved, a small Kocher incision was made with #15 blade. Dissection was carried through subcutaneous tissues and platysma.Hemostasis was achieved with the electrocautery. Skin flaps were elevated cephalad and caudad from the thyroid notch to the sternal notch. A Mahorner self-retaining retractor was placed for exposure. Strap muscles were incised in the midline and dissection was begun on the left side.  Strap muscles were reflected laterally.  Left thyroid lobe was mildly enlarged with multiple nodules.  The left lobe was gently mobilized with blunt dissection. Superior  pole vessels were dissected out and divided individually between small and medium ligaclips with the harmonic scalpel. The thyroid lobe was rolled anteriorly. Branches of the inferior thyroid artery were divided between small ligaclips with the harmonic scalpel. Inferior venous tributaries were divided between ligaclips. Both the superior and inferior parathyroid glands were identified and preserved on their vascular pedicles. The recurrent laryngeal nerve was identified and preserved along its course. The ligament of Allyson Sabal was released with the electrocautery and the gland was mobilized onto the anterior trachea. Isthmus was mobilized across the midline. There was a moderate sized pyramidal lobe present which was resected with the isthmus. Dry pack was placed in the left neck.  The right thyroid lobe was gently mobilized with blunt dissection. Right thyroid lobe was markedly enlarged with a dominant mass occupying most of the lobe.  There was moderate scar tissue anteriorly and laterally due to prior spine surgical procedures. Superior pole vessels were dissected out and divided between small and medium ligaclips with the Harmonic scalpel. Inferior venous tributaries were divided between medium ligaclips with the harmonic scalpel. The right thyroid lobe was rolled anteriorly and the branches of the inferior thyroid artery divided between small ligaclips. The ligament of Allyson Sabal was released with the electrocautery. The right thyroid lobe was mobilized onto the anterior trachea and the remainder of the thyroid was dissected off the anterior trachea and the thyroid was completely excised. A suture was used to mark the left lobe. The entire thyroid gland was submitted to pathology for review.  Palpation of the operative field demonstrated no evidence of residual disease and no abnormal lymph nodes.  The neck was irrigated with warm saline. Fibrillar  was placed throughout the operative field. Strap muscles were  approximated in the midline with interrupted 3-0 Vicryl sutures. Platysma was closed with interrupted 3-0 Vicryl sutures. Skin was closed with a running 4-0 Monocryl subcuticular suture. Wound was washed and Dermabond was applied. The patient was awakened from anesthesia and brought to the recovery room. The patient tolerated the procedure well.   Darnell Level, MD [facility] Office: [phone]

## 2024-01-08 NOTE — Interval H&P Note (Signed)
History and Physical Interval Note:  01/08/2024 8:44 AM  Jenny [name]  has presented today for surgery, with the diagnosis of MULTINODULER THYROID GOITER.  The various methods of treatment have been discussed with the patient and family. After consideration of risks, benefits and other options for treatment, the patient has consented to:    Procedure(s): TOTAL THYROIDECTOMY (N/A) as a surgical intervention.    The patient's history has been reviewed, patient examined, no change in status, stable for surgery.  I have reviewed the patient's chart and labs.  Questions were answered to the patient's satisfaction.    Darnell Level, MD [facility] practice Office: [phone]  Darnell Level

## 2024-01-09 ENCOUNTER — Encounter (HOSPITAL_COMMUNITY): Payer: Self-pay | Admitting: Surgery

## 2024-01-09 DIAGNOSIS — Z6841 Body Mass Index (BMI) 40.0 and over, adult: Secondary | ICD-10-CM | POA: Diagnosis not present

## 2024-01-09 DIAGNOSIS — Z85038 Personal history of other malignant neoplasm of large intestine: Secondary | ICD-10-CM | POA: Diagnosis not present

## 2024-01-09 DIAGNOSIS — E119 Type 2 diabetes mellitus without complications: Secondary | ICD-10-CM | POA: Diagnosis not present

## 2024-01-09 DIAGNOSIS — E042 Nontoxic multinodular goiter: Secondary | ICD-10-CM | POA: Diagnosis not present

## 2024-01-09 DIAGNOSIS — Z7984 Long term (current) use of oral hypoglycemic drugs: Secondary | ICD-10-CM | POA: Diagnosis not present

## 2024-01-09 DIAGNOSIS — E66813 Obesity, class 3: Secondary | ICD-10-CM | POA: Diagnosis not present

## 2024-01-09 DIAGNOSIS — F419 Anxiety disorder, unspecified: Secondary | ICD-10-CM | POA: Diagnosis not present

## 2024-01-09 DIAGNOSIS — F32A Depression, unspecified: Secondary | ICD-10-CM | POA: Diagnosis not present

## 2024-01-09 DIAGNOSIS — M1990 Unspecified osteoarthritis, unspecified site: Secondary | ICD-10-CM | POA: Diagnosis not present

## 2024-01-09 LAB — CALCIUM: Calcium: 8.8 mg/dL — ABNORMAL LOW (ref 8.9–10.3)

## 2024-01-09 LAB — SURGICAL PATHOLOGY

## 2024-01-09 MED ORDER — PREDNISONE 5 MG PO TABS: ORAL_TABLET | ORAL | 0 refills | Status: AC

## 2024-01-09 MED ORDER — TRAMADOL HCL 50 MG PO TABS: 50.0000 mg | ORAL_TABLET | Freq: Four times a day (QID) | ORAL | Status: DC | PRN

## 2024-01-09 NOTE — Plan of Care (Signed)
?  Problem: Clinical Measurements: ?Goal: Will remain free from infection ?Outcome: Progressing ?  ?

## 2024-01-09 NOTE — Progress Notes (Signed)
AVS reviewed w/ pt & her daughter who verbalized an understanding. No other questions at this time. PIV removed as noted - Pt dressing for d/c to home. Ride is here

## 2024-01-09 NOTE — TOC CM/SW Note (Signed)
Transition of Care [facility]) - Inpatient Brief Assessment   Patient Details  Name: Jenny [name] MRN: [id number] Date of Birth: 14-May-1961  Transition of Care [facility]) CM/SW Contact:    Armanda Heritage, RN Phone Number: 01/09/2024, 11:55 AM   Clinical Narrative: Chart reviewed, patient plans to discharge home today.  No TOC needs identified at this time.  TOC will sign off.   Transition of Care Asessment: Insurance and Status: Insurance coverage has been reviewed Patient has primary care physician: Yes Home environment has been reviewed: From Home Prior level of function:: inpendent Prior/Current Home Services: No current home services Social Drivers of Health Review: SDOH reviewed no interventions necessary Readmission risk has been reviewed: Yes Transition of care needs: no transition of care needs at this time

## 2024-01-09 NOTE — Plan of Care (Signed)

## 2024-01-09 NOTE — Discharge Summary (Signed)
[facility] Discharge Summary   Patient ID: Jenny [name] MRN: [id number] DOB/AGE: [birth year] 63 y.o.  Admit date: 01/08/2024 Discharge date: 01/09/2024  Admitting Diagnosis: Multinodular thyroid goiter  Discharge Diagnosis S/P total thyroidectomy   Consultants None   Imaging: No results found.  Procedures Dr. Darnell Level (01/08/24) - total thyroidectomy  Hospital Course:  Patient is a 63 year old female who presented to the office with known multinodular thyroid goiter. Patient was admitted and underwent procedure listed above.  Tolerated procedure well and was transferred to the floor.  Diet was advanced as tolerated.  On POD1, the patient was voiding well, tolerating diet, ambulating well, pain well controlled, vital signs stable, incision c/d/i and felt stable for discharge home.  Patient will follow up in our office as outlined below.   Physical Exam: General:  Alert, NAD, pleasant, comfortable Neck: incision C/D/I with steri-strips present, mild edema but phonating well and no stridor  I or a member of my team have reviewed this patient in the Controlled Substance Database.   Allergies as of 01/09/2024       Reactions   Other Rash   *Derma Bond*   Attends Briefs Small Other (See Comments)   Shellfish Allergy Rash        Medication List     TAKE these medications    acetaminophen 650 MG CR tablet Commonly known as: TYLENOL Take 1,300 mg by mouth every 8 (eight) hours as needed for pain.   ARIPiprazole 10 MG tablet Commonly known as: ABILIFY Take 1 tablet (10 mg total) by mouth daily.   atorvastatin 20 MG tablet Commonly known as: LIPITOR TAKE 1 TABLET BY MOUTH EVERY DAY   buPROPion 150 MG 24 hr tablet Commonly known as: WELLBUTRIN XL Take 1 tablet (150 mg total) by mouth daily. Take total of 450 mg daily (300 mg + 150 mg)   buPROPion 300 MG 24 hr tablet Commonly known as: WELLBUTRIN XL Take 1 tablet (300 mg total) by mouth daily. Take  total of 450 mg daily, along with 150 mg daily   calcium carbonate 500 MG chewable tablet Commonly known as: Tums Chew 2 tablets (400 mg of elemental calcium total) by mouth 3 (three) times daily.   cyclobenzaprine 10 MG tablet Commonly known as: FLEXERIL Take 1 tablet (10 mg total) by mouth 3 (three) times daily as needed for muscle spasms.   diclofenac Sodium 1 % Gel Commonly known as: VOLTAREN Apply 2 g topically 4 (four) times daily. What changed:  when to take this reasons to take this   fluticasone 50 MCG/ACT nasal spray Commonly known as: FLONASE Place 2 sprays into both nostrils daily.   gabapentin 300 MG capsule Commonly known as: NEURONTIN Take 1 capsule (300 mg total) by mouth 3 (three) times daily. Take 2-3 capsules before bed What changed: when to take this   hydrocortisone cream 0.5 % Apply 1 application  topically 2 (two) times daily as needed for itching.   Jardiance 10 MG Tabs tablet Generic drug: empagliflozin TAKE 1 TABLET BY MOUTH EVERY DAY   levothyroxine 100 MCG tablet Commonly known as: Synthroid Take 1 tablet (100 mcg total) by mouth daily before breakfast.   loratadine 10 MG tablet Commonly known as: CLARITIN Take 1 tablet (10 mg total) by mouth daily. AS NEEDED   metFORMIN 500 MG 24 hr tablet Commonly known as: GLUCOPHAGE-XR TAKE 2 TABLETS BY MOUTH TWICE A DAY   methocarbamol 500 MG tablet Commonly known as: Robaxin  Take 1 tablet (500 mg total) by mouth 4 (four) times daily.   multivitamin tablet Take 1 tablet by mouth daily.   predniSONE 5 MG tablet Commonly known as: DELTASONE Take 2 tablets (10 mg total) by mouth 2 (two) times daily with a meal for 2 days, THEN 1 tablet (5 mg total) 2 (two) times daily with a meal for 2 days, THEN 1 tablet (5 mg total) daily with breakfast for 2 days. Start taking on: January 09, 2024   Semaglutide (2 MG/DOSE) 8 MG/3ML Sopn Inject 2 mg into the skin once a week.   traMADol 50 MG tablet Commonly  known as: ULTRAM Take 1 tablet (50 mg total) by mouth every 6 (six) hours as needed for moderate pain (pain score 4-6).   traZODone 50 MG tablet Commonly known as: DESYREL Take 1 tablet (50 mg total) by mouth at bedtime as needed for sleep.   venlafaxine XR 37.5 MG 24 hr capsule Commonly known as: EFFEXOR-XR Take 1 capsule (37.5 mg total) by mouth daily. TAKE 1 CAPSULE BY MOUTH DAILY ALONG WITH THE 150 MG FOR TOTAL DAILY DOSE OF 187.5 MG   venlafaxine XR 150 MG 24 hr capsule Commonly known as: EFFEXOR-XR Take 1 capsule (150 mg total) by mouth daily. Total of 187.5 mg daily. Take along with 37.5 mg cap Start taking on: January 10, 2024          Follow-up Information     Darnell Level, MD. Schedule an appointment as soon as possible for a visit in 3 week(s).   Specialty: General Surgery Why: For wound re-check Contact information: [address] [place] [address] [phone]                 Signed: Juliet Rude , [facility] 01/09/2024, 10:23 AM Please see Amion for pager number during day hours 7:00am-4:30pm

## 2024-01-10 ENCOUNTER — Encounter: Payer: Self-pay | Admitting: Surgery

## 2024-01-10 NOTE — Progress Notes (Signed)
Benign pathology results, as expected.  tmg  Darnell Level, MD [facility] A [facility] practice Office: [phone]

## 2024-01-12 ENCOUNTER — Encounter: Payer: Self-pay | Admitting: Family Medicine

## 2024-01-12 DIAGNOSIS — Z9889 Other specified postprocedural states: Secondary | ICD-10-CM | POA: Insufficient documentation

## 2024-01-12 DIAGNOSIS — Z9089 Acquired absence of other organs: Secondary | ICD-10-CM | POA: Insufficient documentation

## 2024-01-12 DIAGNOSIS — E890 Postprocedural hypothyroidism: Secondary | ICD-10-CM | POA: Insufficient documentation

## 2024-01-16 ENCOUNTER — Ambulatory Visit (HOSPITAL_COMMUNITY): Payer: Medicare PPO | Admitting: Psychiatry
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
## 2024-01-21 ENCOUNTER — Encounter: Payer: Self-pay | Admitting: Psychiatry

## 2024-01-21 ENCOUNTER — Telehealth: Payer: Medicare PPO | Admitting: Psychiatry

## 2024-01-21 DIAGNOSIS — F331 Major depressive disorder, recurrent, moderate: Secondary | ICD-10-CM | POA: Diagnosis not present

## 2024-01-21 DIAGNOSIS — G4700 Insomnia, unspecified: Secondary | ICD-10-CM

## 2024-01-21 DIAGNOSIS — F4310 Post-traumatic stress disorder, unspecified: Secondary | ICD-10-CM

## 2024-01-21 MED ORDER — VENLAFAXINE HCL ER 37.5 MG PO CP24: 37.5000 mg | ORAL_CAPSULE | Freq: Every day | ORAL | 1 refills | Status: DC

## 2024-01-21 NOTE — Patient Instructions (Signed)
Continue venlafaxine 187.5  mg daily  Continue bupropion 450 mg (300 mg + 150 mg) daily Continue Abilify 10 mg daily Continue gabapentin 300 mg twice a day  Continue trazodone 25-50 mg at night as needed for sleep Next appointment: 3/25 at 3 30

## 2024-01-30 ENCOUNTER — Ambulatory Visit (INDEPENDENT_AMBULATORY_CARE_PROVIDER_SITE_OTHER): Payer: Medicare PPO | Admitting: Psychiatry

## 2024-01-30 DIAGNOSIS — F329 Major depressive disorder, single episode, unspecified: Secondary | ICD-10-CM | POA: Diagnosis not present

## 2024-01-30 DIAGNOSIS — F331 Major depressive disorder, recurrent, moderate: Secondary | ICD-10-CM

## 2024-01-30 DIAGNOSIS — F4310 Post-traumatic stress disorder, unspecified: Secondary | ICD-10-CM | POA: Diagnosis not present
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
## 2024-02-04 ENCOUNTER — Other Ambulatory Visit: Payer: Self-pay | Admitting: *Deleted

## 2024-02-04 NOTE — Patient Outreach (Signed)
Care Management   Visit Note  02/04/2024 Name: Jenny [name] MRN: [id number] DOB: April 08, 1961  Subjective: Jenny [name] is a 63 y.o. year old female who is a primary care patient of Westley Chandler, MD. The Care Management team was consulted for assistance.      Engaged with patient spoke with patient by telephone.    Goals Addressed             This Visit's Progress    RNCM Care Managment Expected Outcome: Monitor, Self-Manage and Reduce Symptoms of: Diabetes, Pain       Current Barriers:  Knowledge Deficits related to plan of care for management of DMII and Pain  Chronic Disease Management support and education needs related to DMII and Pain   RNCM Clinical Goal(s):  Patient will verbalize basic understanding of  DMII and Pain disease process and self health management plan as evidenced by verbal explanation, recognizing symptoms, lifestyle modifications and daily monitoring take all medications exactly as prescribed and will call provider for medication related questions as evidenced by being compliant with all medications attend all scheduled medical appointments: with primary care provider and specialist as evidenced by keeping all scheduled appointments continue to work with RN Care Manager to address care management and care coordination needs related to  DMII and pain as evidenced by adherence to CM Team Scheduled appointments through collaboration with RN Care manager, provider, and care team.   Interventions: Evaluation of current treatment plan related to  self management and patient's adherence to plan as established by provider   Diabetes Interventions:  (Status:  Goal on track:  NO.) Long Term Goal Assessed patient's understanding of A1c goal: <6.5% Provided education to patient about basic DM disease process. Patient states she has not been following her diet as closely & RNCM reviewed with patient about having half of her plate being vegetables followed by  protein. Fasting glucose this morning 132. Lowest 117 & highest 157. She is checking her blood sugar once daily. RNCM reviewed with patient the potential complications if she does not get her diabetes under control. Patient s/p total thyroidectomy on 01-08-2024 with voice notable for horseness, patient states she is doing well at this time.  Reviewed medications with patient and discussed importance of medication adherence. Reports compliance with all medications Counseled on importance of regular laboratory monitoring as prescribed Discussed plans with patient for ongoing care management follow up and provided patient with direct contact information for care management team Review of patient status, including review of consultants reports, relevant laboratory and other test results, and medications completed Assessed social determinant of health barriers  Lab Results  Component Value Date   HGBA1C 7.1 (A) 12/09/2023    Pain Interventions:  (Status:  Goal on track:  Yes.) Long Term Goal Pain assessment performed. Reports 10/10 pain this morning to her left shoulder. Reports no improvement with medication regimen and non pharmacological interventions. Reports that she feels like it is joint pain. RNCM advised patient to reach out to provider to determine if she needs a referral to ortho.  Medications reviewed Reviewed provider established plan for pain management Discussed importance of adherence to all scheduled medical appointments Counseled on the importance of reporting any/all new or changed pain symptoms or management strategies to pain management provider Advised patient to report to care team affect of pain on daily activities Discussed use of relaxation techniques and/or diversional activities to assist with pain reduction (distraction, imagery, relaxation, massage, acupressure, TENS, heat, and  cold application Reviewed with patient prescribed pharmacological and nonpharmacological pain  relief strategies Screening for signs and symptoms of depression related to chronic disease state   Patient Goals/Self-Care Activities: Take all medications as prescribed Attend all scheduled provider appointments Call pharmacy for medication refills 3-7 days in advance of running out of medications Perform all self care activities independently  Call provider office for new concerns or questions  call the Suicide and Crisis Lifeline: 988 call the [place] National Suicide Prevention Lifeline: 580-794-0134 or TTY: (252)178-8655 TTY (650)106-0009) to talk to a trained counselor call 1-800-273-TALK (toll free, 24 hour hotline) go to [facility] [address], [place] [phone]) call 911 if experiencing a Mental Health or Behavioral Health Crisis  check feet daily for cuts, sores or redness take the blood sugar log to all doctor visits manage portion size wear comfortable, cotton socks wear comfortable, well-fitting shoes  Follow Up Plan:  Telephone follow up appointment with care management team member scheduled for:  03-08-2024 at 11:45 am             Consent to Services:  Patient was given information about care management services, agreed to services, and gave verbal consent to participate.   Plan: Telephone follow up appointment with care management team member scheduled for:03-08-2024 at 11:45 am  Larey Brick, BSN RN [facility], [facility] RN Care Manager Direct Dial: [phone]  Fax: [phone]

## 2024-02-04 NOTE — Patient Instructions (Signed)
Visit Information  Thank you for taking time to visit with me today. Please don't hesitate to contact me if I can be of assistance to you before our next scheduled telephone appointment.  Following are the goals we discussed today:   Goals Addressed             This Visit's Progress    RNCM Care Managment Expected Outcome: Monitor, Self-Manage and Reduce Symptoms of: Diabetes, Pain       Current Barriers:  Knowledge Deficits related to plan of care for management of DMII and Pain  Chronic Disease Management support and education needs related to DMII and Pain   RNCM Clinical Goal(s):  Patient will verbalize basic understanding of  DMII and Pain disease process and self health management plan as evidenced by verbal explanation, recognizing symptoms, lifestyle modifications and daily monitoring take all medications exactly as prescribed and will call provider for medication related questions as evidenced by being compliant with all medications attend all scheduled medical appointments: with primary care provider and specialist as evidenced by keeping all scheduled appointments continue to work with RN Care Manager to address care management and care coordination needs related to  DMII and pain as evidenced by adherence to CM Team Scheduled appointments through collaboration with RN Care manager, provider, and care team.   Interventions: Evaluation of current treatment plan related to  self management and patient's adherence to plan as established by provider   Diabetes Interventions:  (Status:  Goal on track:  NO.) Long Term Goal Assessed patient's understanding of A1c goal: <6.5% Provided education to patient about basic DM disease process. Patient states she has not been following her diet as closely & RNCM reviewed with patient about having half of her plate being vegetables followed by protein. Fasting glucose this morning 132. Lowest 117 & highest 157. She is checking her blood sugar  once daily. RNCM reviewed with patient the potential complications if she does not get her diabetes under control. Patient s/p total thyroidectomy on 01-08-2024 with voice notable for horseness, patient states she is doing well at this time.  Reviewed medications with patient and discussed importance of medication adherence. Reports compliance with all medications Counseled on importance of regular laboratory monitoring as prescribed Discussed plans with patient for ongoing care management follow up and provided patient with direct contact information for care management team Review of patient status, including review of consultants reports, relevant laboratory and other test results, and medications completed Assessed social determinant of health barriers  Lab Results  Component Value Date   HGBA1C 7.1 (A) 12/09/2023    Pain Interventions:  (Status:  Goal on track:  Yes.) Long Term Goal Pain assessment performed. Reports 10/10 pain this morning to her left shoulder. Reports no improvement with medication regimen and non pharmacological interventions. Reports that she feels like it is joint pain. RNCM advised patient to reach out to provider to determine if she needs a referral to ortho.  Medications reviewed Reviewed provider established plan for pain management Discussed importance of adherence to all scheduled medical appointments Counseled on the importance of reporting any/all new or changed pain symptoms or management strategies to pain management provider Advised patient to report to care team affect of pain on daily activities Discussed use of relaxation techniques and/or diversional activities to assist with pain reduction (distraction, imagery, relaxation, massage, acupressure, TENS, heat, and cold application Reviewed with patient prescribed pharmacological and nonpharmacological pain relief strategies Screening for signs and symptoms of depression related  to chronic disease state    Patient Goals/Self-Care Activities: Take all medications as prescribed Attend all scheduled provider appointments Call pharmacy for medication refills 3-7 days in advance of running out of medications Perform all self care activities independently  Call provider office for new concerns or questions  call the Suicide and Crisis Lifeline: 988 call the [place] National Suicide Prevention Lifeline: 3376125520 or TTY: (416) 392-6799 TTY (832)214-5856) to talk to a trained counselor call 1-800-273-TALK (toll free, 24 hour hotline) go to [facility] [address], [place] [phone]) call 911 if experiencing a Mental Health or Behavioral Health Crisis  check feet daily for cuts, sores or redness take the blood sugar log to all doctor visits manage portion size wear comfortable, cotton socks wear comfortable, well-fitting shoes  Follow Up Plan:  Telephone follow up appointment with care management team member scheduled for:  03-08-2024 at 11:45 am           Our next appointment is by telephone on 03-08-2024 at 11:45 am  Please call the care guide team at [phone] if you need to cancel or reschedule your appointment.   If you are experiencing a Mental Health or Behavioral Health Crisis or need someone to talk to, please call the Suicide and Crisis Lifeline: 988 call the [place] National Suicide Prevention Lifeline: 626 737 5684 or TTY: 778-081-0237 TTY 5300094154) to talk to a trained counselor call 1-800-273-TALK (toll free, 24 hour hotline) go to [facility] [address] [place] [phone])   Patient verbalizes understanding of instructions and care plan provided today and agrees to view in MyChart. Active MyChart status and patient understanding of how to access instructions and care plan via MyChart confirmed with patient.     Telephone follow up appointment with care management team member  scheduled for:03-08-2024 at 11:45 am  Larey Brick, BSN RN [facility], [facility] RN Care Manager Direct Dial: [phone]  Fax: [phone]

## 2024-02-10 ENCOUNTER — Encounter: Payer: Self-pay | Admitting: Family Medicine

## 2024-02-10 ENCOUNTER — Ambulatory Visit (INDEPENDENT_AMBULATORY_CARE_PROVIDER_SITE_OTHER): Payer: Medicare PPO | Admitting: Family Medicine

## 2024-02-10 VITALS — BP 146/95 | HR 82 | Ht 64.0 in | Wt 239.6 lb

## 2024-02-10 DIAGNOSIS — M25512 Pain in left shoulder: Secondary | ICD-10-CM | POA: Diagnosis not present

## 2024-02-10 MED ORDER — CYCLOBENZAPRINE HCL 10 MG PO TABS: 5.0000 mg | ORAL_TABLET | Freq: Two times a day (BID) | ORAL | 0 refills | Status: DC | PRN

## 2024-02-10 NOTE — Patient Instructions (Addendum)
Great to see you  Referring to orthopedic specialist, also podiatrist Sent in flexeril to help relax muscles. Use caution as it may make you sleepy Continue heat/ice  Follow up with Dr. Manson Passey as scheduled  Be well, Dr. Pollie Meyer

## 2024-02-10 NOTE — Progress Notes (Unsigned)
  Date of Visit: 02/10/2024   SUBJECTIVE:   HPI:  Jenny [name] presents today for a same day appointment to discuss left shoulder pain.  Has had chronic pain in left arm/shoulder for years, but worsened recently.  Has not ever seen a specialist for her shoulder in the past.  Initially patient reported she has not had any injuries to the shoulder that might of prompted this worsened pain, however her granddaughter is present and reports that the patient was moving furniture last week.  Last week, reportedly had much better range of motion of the shoulder.  She took Tylenol 1000 mg this morning for the pain.  Also takes tramadol 50 to 100 mg at a time for pain.  Recently had thyroidectomy, also had ACDF in June.  PMHx: colon cancer, obesity, type 2 diabetes, anxiety/depression/PTSD, hyperlipidemia, recent thyroidectomy  OBJECTIVE:   BP (!) 146/95   Pulse 82   Ht 5\' 4"  (1.626 m)   Wt 239 lb 9.6 oz (108.7 kg)   SpO2 98%   BMI 41.13 kg/m  Gen: No acute distress, pleasant, cooperative, well-appearing HEENT: Normocephalic, atraumatic Ext: Jumps in pain to light touch of any part of the upper left arm.  No warmth, swelling, erythema appreciated.  Very minimal range of motion of left shoulder actively or passively due to reported pain.  Grip 5 out of 5 bilaterally.  Full strength with left wrist flexion, extension, inversion, eversion.  Also full strength with left elbow extension and flexion.  ASSESSMENT/PLAN:   Assessment & Plan Left shoulder pain, unspecified chronicity Acute on chronic left shoulder pain, with very limited range of motion on exam today.  Suspect some degree of muscle spasm contributing.  Sent Rx for Flexeril.  Counseled on risk of sedation with this medication. Also placing referral to orthopedics, anticipate x-rays will be done at the time of that appointment.  Encouraged her to remain active and still move her arm as she is able to prevent development of adhesive capsulitis.    Has upcoming appointment to meet new PCP; was recently reassigned to Dr. Manson Passey after Dr. Deirdre Priest retired.  [place] J. Pollie Meyer, MD [facility]

## 2024-02-12 DIAGNOSIS — M25512 Pain in left shoulder: Secondary | ICD-10-CM | POA: Diagnosis not present

## 2024-02-13 ENCOUNTER — Ambulatory Visit (HOSPITAL_COMMUNITY): Payer: Medicare PPO | Admitting: Psychiatry

## 2024-02-13 DIAGNOSIS — F331 Major depressive disorder, recurrent, moderate: Secondary | ICD-10-CM | POA: Diagnosis not present

## 2024-02-13 DIAGNOSIS — F4310 Post-traumatic stress disorder, unspecified: Secondary | ICD-10-CM
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
## 2024-02-19 DIAGNOSIS — M25512 Pain in left shoulder: Secondary | ICD-10-CM | POA: Diagnosis not present

## 2024-02-20 NOTE — Progress Notes (Signed)
    SUBJECTIVE:   CHIEF COMPLAINT: check up HPI:   Jenny [name] is a 63 y.o.  with history notable for colon cancer, type 2 DM, and mood disorder presenting for follow up .   She reports she is recovering from her thyroid surgery. Due for a TSH. No symptoms of excess/deficient thyroid.  She recently had frozen shoulder, given Mobic by [facility]. Doing LOTS of stretches and improving.  She is up to date on eye exam--has it scheduled. Declines PCV20 today due to shoulder pain. Reports she is interested in switch to [facility] pending A1C and weight at next visit. She is trying to be more active--getting back to walking with weather.   PERTINENT  PMH / PSH/Family/Social History : colon cancer, thyroid disease s/p thyroidectomy, diabetes  OBJECTIVE:   BP 120/87   Pulse 97   Ht 5\' 4"  (1.626 m)   Wt 238 lb 3.2 oz (108 kg)   SpO2 98%   BMI 40.89 kg/m   Today's weight:  Last Weight  Most recent update: 02/23/2024 10:35 AM    Weight  108 kg (238 lb 3.2 oz)            Review of prior weights: Filed Weights   02/23/24 1035  Weight: 238 lb 3.2 oz (108 kg)     Cardiac: Regular rate and rhythm. Normal S1/S2. No murmurs, rubs, or gallops appreciated. Lungs: Clear bilaterally to ascultation. Psych: Pleasant and appropriate  Neck  Healing scar from thyroidectomy    ASSESSMENT/PLAN:   Assessment & Plan Type 2 diabetes mellitus without complication, without long-term current use of insulin (HCC) A1C at goal UACR today Consider switch to [facility] at next visit  Enlarged thyroid TSH today  MDD (major depressive disorder), recurrent, in partial remission (HCC) Doing well, reviewed medications  History of thyroidectomy TSH today   Left shoulder pain  BMP today as on Mobic   HCM PCV 20 at next visit  At next visit obtain hepatic panel with serologies--RUQ ultrasound--had steatosis on prior CT   Terisa Starr, MD  Family Medicine Teaching Service  [facility]

## 2024-02-23 ENCOUNTER — Encounter: Payer: Self-pay | Admitting: Family Medicine

## 2024-02-23 ENCOUNTER — Ambulatory Visit (INDEPENDENT_AMBULATORY_CARE_PROVIDER_SITE_OTHER): Payer: Medicare PPO | Admitting: Family Medicine

## 2024-02-23 VITALS — BP 120/87 | HR 97 | Ht 64.0 in | Wt 238.2 lb

## 2024-02-23 DIAGNOSIS — E890 Postprocedural hypothyroidism: Secondary | ICD-10-CM | POA: Diagnosis not present

## 2024-02-23 DIAGNOSIS — E049 Nontoxic goiter, unspecified: Secondary | ICD-10-CM

## 2024-02-23 DIAGNOSIS — E119 Type 2 diabetes mellitus without complications: Secondary | ICD-10-CM | POA: Diagnosis not present

## 2024-02-23 DIAGNOSIS — F3341 Major depressive disorder, recurrent, in partial remission: Secondary | ICD-10-CM

## 2024-02-23 NOTE — Patient Instructions (Signed)
 It was wonderful to see you today. TELL YOUR HUSBAND I SAY HELLO--Can't wait to meet him  Please bring ALL of your medications with you to every visit.   Today we talked about:  - I will message Dr. Gerrit Friends your results  We will discuss Mounjaro at your next visit   Please follow up in 1 months   Thank you for choosing [facility].   Please call [phone] with any questions about today's appointment.  Please be sure to schedule follow up at the front  desk before you leave today.   Terisa Starr, MD  Family Medicine

## 2024-02-23 NOTE — Assessment & Plan Note (Signed)
 Doing well, reviewed medications

## 2024-02-23 NOTE — Assessment & Plan Note (Signed)
 TSH today

## 2024-02-23 NOTE — Assessment & Plan Note (Addendum)
 TSH today

## 2024-02-23 NOTE — Assessment & Plan Note (Addendum)
 A1C at goal UACR today Consider switch to [facility] at next visit

## 2024-02-24 ENCOUNTER — Encounter: Payer: Self-pay | Admitting: Family Medicine

## 2024-02-24 LAB — BASIC METABOLIC PANEL
BUN/Creatinine Ratio: 16 (ref 12–28)
BUN: 12 mg/dL (ref 8–27)
CO2: 25 mmol/L (ref 20–29)
Calcium: 9.3 mg/dL (ref 8.7–10.3)
Chloride: 102 mmol/L (ref 96–106)
Creatinine, Ser: 0.73 mg/dL (ref 0.57–1.00)
Glucose: 143 mg/dL — ABNORMAL HIGH (ref 70–99)
Potassium: 4.4 mmol/L (ref 3.5–5.2)
Sodium: 143 mmol/L (ref 134–144)
eGFR: 94 mL/min/{1.73_m2} (ref >59)

## 2024-02-24 LAB — TSH: TSH: 1.05 u[IU]/mL (ref 0.450–4.500)

## 2024-02-25 LAB — MICROALBUMIN / CREATININE URINE RATIO
Creatinine, Urine: 123.6 mg/dL
Microalb/Creat Ratio: 10 mg/g{creat} (ref 0–29)
Microalbumin, Urine: 12.6 ug/mL

## 2024-02-27 ENCOUNTER — Ambulatory Visit (HOSPITAL_COMMUNITY): Payer: Medicare PPO | Admitting: Psychiatry

## 2024-02-27 ENCOUNTER — Telehealth (HOSPITAL_COMMUNITY): Payer: Self-pay | Admitting: Psychiatry

## 2024-02-27 NOTE — Telephone Encounter (Signed)
 Therapist attempted to contact patient via text, no response.  Therapist called patient, left message indicating attempt, and requesting patient call office.

## 2024-03-04 DIAGNOSIS — M25512 Pain in left shoulder: Secondary | ICD-10-CM | POA: Diagnosis not present

## 2024-03-08 ENCOUNTER — Other Ambulatory Visit: Payer: Self-pay | Admitting: *Deleted

## 2024-03-08 NOTE — Patient Instructions (Signed)
 Visit Information  Thank you for taking time to visit with me today. Please don't hesitate to contact me if I can be of assistance to you before our next scheduled telephone appointment.  Following are the goals we discussed today:   Goals Addressed             This Visit's Progress    RNCM Care Managment Expected Outcome: Monitor, Self-Manage and Reduce Symptoms of: Diabetes, Pain       Current Barriers:  Knowledge Deficits related to plan of care for management of DMII and Pain  Chronic Disease Management support and education needs related to DMII and Pain   RNCM Clinical Goal(s):  Patient will verbalize basic understanding of  DMII and Pain disease process and self health management plan as evidenced by verbal explanation, recognizing symptoms, lifestyle modifications and daily monitoring take all medications exactly as prescribed and will call provider for medication related questions as evidenced by being compliant with all medications attend all scheduled medical appointments: with primary care provider and specialist as evidenced by keeping all scheduled appointments continue to work with RN Care Manager to address care management and care coordination needs related to  DMII and pain as evidenced by adherence to CM Team Scheduled appointments through collaboration with RN Care manager, provider, and care team.   Interventions: Evaluation of current treatment plan related to  self management and patient's adherence to plan as established by provider   Diabetes Interventions:  (Status:  Goal on track:  NO.) Long Term Goal Assessed patient's understanding of A1c goal: <6.5% Provided education to patient about basic DM disease process. Checks blood sugar daily. Fasting glucose this morning 150. Lowest 133 & highest 165. She reports that her readings have been higher and notes that it is harder to eat healthy due to cost and her husband being unemployed at this time. RNCM reviewed  with patient goal fasting <130 post prandial <180. Patient verbalized full understanding. Reviewed medications with patient and discussed importance of medication adherence. Reports compliance with all medications Counseled on importance of regular laboratory monitoring as prescribed Discussed plans with patient for ongoing care management follow up and provided patient with direct contact information for care management team Review of patient status, including review of consultants reports, relevant laboratory and other test results, and medications completed Assessed social determinant of health barriers  Lab Results  Component Value Date   HGBA1C 7.1 (A) 12/09/2023    Pain Interventions:  (Status:  Goal on track:  Yes.) Long Term Goal Pain assessment performed. Reports 5/10 pain this morning to her left shoulder. Medications reviewed. Reports that the flexeril that has been provided is helping her some but she still has pain Reviewed provider established plan for pain management Discussed importance of adherence to all scheduled medical appointments Counseled on the importance of reporting any/all new or changed pain symptoms or management strategies to pain management provider Advised patient to report to care team affect of pain on daily activities Discussed use of relaxation techniques and/or diversional activities to assist with pain reduction (distraction, imagery, relaxation, massage, acupressure, TENS, heat, and cold application Reviewed with patient prescribed pharmacological and nonpharmacological pain relief strategies Screening for signs and symptoms of depression related to chronic disease state   Patient Goals/Self-Care Activities: Take all medications as prescribed Attend all scheduled provider appointments Call pharmacy for medication refills 3-7 days in advance of running out of medications Perform all self care activities independently  Call provider office for new  concerns or questions  call the Suicide and Crisis Lifeline: 988 call the [place] National Suicide Prevention Lifeline: 901-366-1828 or TTY: (445)116-6716 TTY 7877970690) to talk to a trained counselor call 1-800-273-TALK (toll free, 24 hour hotline) go to [facility] [address], [place] [phone]) call 911 if experiencing a Mental Health or Behavioral Health Crisis  check feet daily for cuts, sores or redness take the blood sugar log to all doctor visits manage portion size wear comfortable, cotton socks wear comfortable, well-fitting shoes  Follow Up Plan:  Telephone follow up appointment with care management team member scheduled for:  04-08-2024 at 11:00 am           Our next appointment is by telephone on 04-08-2024 at 11:00 am  Please call the care guide team at [phone] if you need to cancel or reschedule your appointment.   If you are experiencing a Mental Health or Behavioral Health Crisis or need someone to talk to, please call the Suicide and Crisis Lifeline: 988 call the [place] National Suicide Prevention Lifeline: 639-225-5043 or TTY: (717)070-4566 TTY 845 580 8092) to talk to a trained counselor call 1-800-273-TALK (toll free, 24 hour hotline) go to [facility] [address] [place] [phone])   Patient verbalizes understanding of instructions and care plan provided today and agrees to view in MyChart. Active MyChart status and patient understanding of how to access instructions and care plan via MyChart confirmed with patient.     Telephone follow up appointment with care management team member scheduled for:04-08-2024 at 11:00 am  Larey Brick, BSN RN [facility], [facility] RN Care Manager Direct Dial: [phone]  Fax: [phone]   Diabetes Mellitus and Nutrition, Adult When you have diabetes, or diabetes mellitus, it is very  important to have healthy eating habits because your blood sugar (glucose) levels are greatly affected by what you eat and drink. Eating healthy foods in the right amounts, at about the same times every day, can help you: Manage your blood glucose. Lower your risk of heart disease. Improve your blood pressure. Reach or maintain a healthy weight. What can affect my meal plan? Every person with diabetes is different, and each person has different needs for a meal plan. Your health care provider may recommend that you work with a dietitian to make a meal plan that is best for you. Your meal plan may vary depending on factors such as: The calories you need. The medicines you take. Your weight. Your blood glucose, blood pressure, and cholesterol levels. Your activity level. Other health conditions you have, such as heart or kidney disease. How do carbohydrates affect me? Carbohydrates, also called carbs, affect your blood glucose level more than any other type of food. Eating carbs raises the amount of glucose in your blood. It is important to know how many carbs you can safely have in each meal. This is different for every person. Your dietitian can help you calculate how many carbs you should have at each meal and for each snack. How does alcohol affect me? Alcohol can cause a decrease in blood glucose (hypoglycemia), especially if you use insulin or take certain diabetes medicines by mouth. Hypoglycemia can be a life-threatening condition. Symptoms of hypoglycemia, such as sleepiness, dizziness, and confusion, are similar to symptoms of having too much alcohol. Do not drink alcohol if: Your health care provider tells you not to drink. You are pregnant, may be pregnant, or are planning to  become pregnant. If you drink alcohol: Limit how much you have to: 0-1 drink a day for women. 0-2 drinks a day for men. Know how much alcohol is in your drink. In the [place] one drink equals one 12 oz bottle of  beer (355 mL), one 5 oz glass of wine (148 mL), or one 1 oz glass of hard liquor (44 mL). Keep yourself hydrated with water, diet soda, or unsweetened iced tea. Keep in mind that regular soda, juice, and other mixers may contain a lot of sugar and must be counted as carbs. What are tips for following this plan?  Reading food labels Start by checking the serving size on the Nutrition Facts label of packaged foods and drinks. The number of calories and the amount of carbs, fats, and other nutrients listed on the label are based on one serving of the item. Many items contain more than one serving per package. Check the total grams (g) of carbs in one serving. Check the number of grams of saturated fats and trans fats in one serving. Choose foods that have a low amount or none of these fats. Check the number of milligrams (mg) of salt (sodium) in one serving. Most people should limit total sodium intake to less than 2,300 mg per day. Always check the nutrition information of foods labeled as "low-fat" or "nonfat." These foods may be higher in added sugar or refined carbs and should be avoided. Talk to your dietitian to identify your daily goals for nutrients listed on the label. Shopping Avoid buying canned, pre-made, or processed foods. These foods tend to be high in fat, sodium, and added sugar. Shop around the outside edge of the grocery store. This is where you will most often find fresh fruits and vegetables, bulk grains, fresh meats, and fresh dairy products. Cooking Use low-heat cooking methods, such as baking, instead of high-heat cooking methods, such as deep frying. Cook using healthy oils, such as olive, canola, or sunflower oil. Avoid cooking with butter, cream, or high-fat meats. Meal planning Eat meals and snacks regularly, preferably at the same times every day. Avoid going long periods of time without eating. Eat foods that are high in fiber, such as fresh fruits, vegetables, beans,  and whole grains. Eat 4-6 oz (112-168 g) of lean protein each day, such as lean meat, chicken, fish, eggs, or tofu. One ounce (oz) (28 g) of lean protein is equal to: 1 oz (28 g) of meat, chicken, or fish. 1 egg.  cup (62 g) of tofu. Eat some foods each day that contain healthy fats, such as avocado, nuts, seeds, and fish. What foods should I eat? Fruits Berries. Apples. Oranges. Peaches. Apricots. Plums. Grapes. Mangoes. Papayas. Pomegranates. Kiwi. Cherries. Vegetables Leafy greens, including lettuce, spinach, kale, chard, collard greens, mustard greens, and cabbage. Beets. Cauliflower. Broccoli. Carrots. Green beans. Tomatoes. Peppers. Onions. Cucumbers. Brussels sprouts. Grains Whole grains, such as whole-wheat or whole-grain bread, crackers, tortillas, cereal, and pasta. Unsweetened oatmeal. Quinoa. Brown or wild rice. Meats and other proteins Seafood. Poultry without skin. Lean cuts of poultry and beef. Tofu. Nuts. Seeds. Dairy Low-fat or fat-free dairy products such as milk, yogurt, and cheese. The items listed above may not be a complete list of foods and beverages you can eat and drink. Contact a dietitian for more information. What foods should I avoid? Fruits Fruits canned with syrup. Vegetables Canned vegetables. Frozen vegetables with butter or cream sauce. Grains Refined white flour and flour products such as bread, pasta,  snack foods, and cereals. Avoid all processed foods. Meats and other proteins Fatty cuts of meat. Poultry with skin. Breaded or fried meats. Processed meat. Avoid saturated fats. Dairy Full-fat yogurt, cheese, or milk. Beverages Sweetened drinks, such as soda or iced tea. The items listed above may not be a complete list of foods and beverages you should avoid. Contact a dietitian for more information. Questions to ask a health care provider Do I need to meet with a certified diabetes care and education specialist? Do I need to meet with a  dietitian? What number can I call if I have questions? When are the best times to check my blood glucose? Where to find more information: American Diabetes Association: diabetes.org Academy of Nutrition and Dietetics: eatright.[employer] of Diabetes and Digestive and Kidney Diseases: StageSync.si Association of Diabetes Care & Education Specialists: diabeteseducator.org Summary It is important to have healthy eating habits because your blood sugar (glucose) levels are greatly affected by what you eat and drink. It is important to use alcohol carefully. A healthy meal plan will help you manage your blood glucose and lower your risk of heart disease. Your health care provider may recommend that you work with a dietitian to make a meal plan that is best for you. This information is not intended to replace advice given to you by your health care provider. Make sure you discuss any questions you have with your health care provider. Document Revised: 07/11/2020 Document Reviewed: 07/12/2020 Elsevier Patient Education  2024 Elsevier Inc.  Carbohydrate Counting for Diabetes Mellitus, Adult Carbohydrate counting is a method of keeping track of how many carbohydrates you eat. Eating carbohydrates increases the amount of sugar (glucose) in the blood. Counting how many carbohydrates you eat improves how well you manage your blood glucose. This, in turn, helps you manage your diabetes. Carbohydrates are measured in grams (g) per serving. It is important to know how many carbohydrates (in grams or by serving size) you can have in each meal. This is different for every person. A dietitian can help you make a meal plan and calculate how many carbohydrates you should have at each meal and snack. What foods contain carbohydrates? Carbohydrates are found in the following foods: Grains, such as breads and cereals. Dried beans and soy products. Starchy vegetables, such as potatoes, peas, and  corn. Fruit and fruit juices. Milk and yogurt. Sweets and snack foods, such as cake, cookies, candy, chips, and soft drinks. How do I count carbohydrates in foods? There are two ways to count carbohydrates in food. You can read food labels or learn standard serving sizes of foods. You can use either of these methods or a combination of both. Using the Nutrition Facts label The Nutrition Facts list is included on the labels of almost all packaged foods and beverages in the [place]. It includes: The serving size. Information about nutrients in each serving, including the grams of carbohydrate per serving. To use the Nutrition Facts, decide how many servings you will have. Then, multiply the number of servings by the number of carbohydrates per serving. The resulting number is the total grams of carbohydrates that you will be having. Learning the standard serving sizes of foods When you eat carbohydrate foods that are not packaged or do not include Nutrition Facts on the label, you need to measure the servings in order to count the grams of carbohydrates. Measure the foods that you will eat with a food scale or measuring cup, if needed. Decide how  many standard-size servings you will eat. Multiply the number of servings by 15. For foods that contain carbohydrates, one serving equals 15 g of carbohydrates. For example, if you eat 2 cups or 10 oz (300 g) of strawberries, you will have eaten 2 servings and 30 g of carbohydrates (2 servings x 15 g = 30 g). For foods that have more than one food mixed, such as soups and casseroles, you must count the carbohydrates in each food that is included. The following list contains standard serving sizes of common carbohydrate-rich foods. Each of these servings has about 15 g of carbohydrates: 1 slice of bread. 1 six-inch (15 cm) tortilla. ? cup or 2 oz (53 g) cooked rice or pasta.  cup or 3 oz (85 g) cooked or canned, drained and rinsed beans or  lentils.  cup or 3 oz (85 g) starchy vegetable, such as peas, corn, or squash.  cup or 4 oz (120 g) hot cereal.  cup or 3 oz (85 g) boiled or mashed potatoes, or  or 3 oz (85 g) of a large baked potato.  cup or 4 fl oz (118 mL) fruit juice. 1 cup or 8 fl oz (237 mL) milk. 1 small or 4 oz (106 g) apple.  or 2 oz (63 g) of a medium banana. 1 cup or 5 oz (150 g) strawberries. 3 cups or 1 oz (28.3 g) popped popcorn. What is an example of carbohydrate counting? To calculate the grams of carbohydrates in this sample meal, follow the steps shown below. Sample meal 3 oz (85 g) chicken breast. ? cup or 4 oz (106 g) brown rice.  cup or 3 oz (85 g) corn. 1 cup or 8 fl oz (237 mL) milk. 1 cup or 5 oz (150 g) strawberries with sugar-free whipped topping. Carbohydrate calculation Identify the foods that contain carbohydrates: Rice. Corn. Milk. Strawberries. Calculate how many servings you have of each food: 2 servings rice. 1 serving corn. 1 serving milk. 1 serving strawberries. Multiply each number of servings by 15 g: 2 servings rice x 15 g = 30 g. 1 serving corn x 15 g = 15 g. 1 serving milk x 15 g = 15 g. 1 serving strawberries x 15 g = 15 g. Add together all of the amounts to find the total grams of carbohydrates eaten: 30 g + 15 g + 15 g + 15 g = 75 g of carbohydrates total. What are tips for following this plan? Shopping Develop a meal plan and then make a shopping list. Buy fresh and frozen vegetables, fresh and frozen fruit, dairy, eggs, beans, lentils, and whole grains. Look at food labels. Choose foods that have more fiber and less sugar. Avoid processed foods and foods with added sugars. Meal planning Aim to have the same number of grams of carbohydrates at each meal and for each snack time. Plan to have regular, balanced meals and snacks. Where to find more information American Diabetes Association: diabetes.org Centers for Disease Control and Prevention:  TonerPromos.no Academy of Nutrition and Dietetics: eatright.org Association of Diabetes Care & Education Specialists: diabeteseducator.org Summary Carbohydrate counting is a method of keeping track of how many carbohydrates you eat. Eating carbohydrates increases the amount of sugar (glucose) in your blood. Counting how many carbohydrates you eat improves how well you manage your blood glucose. This helps you manage your diabetes. A dietitian can help you make a meal plan and calculate how many carbohydrates you should have at each meal and snack.  This information is not intended to replace advice given to you by your health care provider. Make sure you discuss any questions you have with your health care provider. Document Revised: 07/11/2020 Document Reviewed: 07/12/2020 Elsevier Patient Education  2024 [employer].

## 2024-03-08 NOTE — Patient Outreach (Signed)
 Care Management   Visit Note  03/08/2024 Name: Jenny [name] MRN: [id number] DOB: Apr 03, 1961  Subjective: Jenny [name] is a 63 y.o. year old female who is a primary care patient of Westley Chandler, MD. The Care Management team was consulted for assistance.      Engaged with patient spoke with patient by telephone.    Goals Addressed             This Visit's Progress    RNCM Care Managment Expected Outcome: Monitor, Self-Manage and Reduce Symptoms of: Diabetes, Pain       Current Barriers:  Knowledge Deficits related to plan of care for management of DMII and Pain  Chronic Disease Management support and education needs related to DMII and Pain   RNCM Clinical Goal(s):  Patient will verbalize basic understanding of  DMII and Pain disease process and self health management plan as evidenced by verbal explanation, recognizing symptoms, lifestyle modifications and daily monitoring take all medications exactly as prescribed and will call provider for medication related questions as evidenced by being compliant with all medications attend all scheduled medical appointments: with primary care provider and specialist as evidenced by keeping all scheduled appointments continue to work with RN Care Manager to address care management and care coordination needs related to  DMII and pain as evidenced by adherence to CM Team Scheduled appointments through collaboration with RN Care manager, provider, and care team.   Interventions: Evaluation of current treatment plan related to  self management and patient's adherence to plan as established by provider   Diabetes Interventions:  (Status:  Goal on track:  NO.) Long Term Goal Assessed patient's understanding of A1c goal: <6.5% Provided education to patient about basic DM disease process. Checks blood sugar daily. Fasting glucose this morning 150. Lowest 133 & highest 165. She reports that her readings have been higher and notes that it is  harder to eat healthy due to cost and her husband being unemployed at this time. RNCM reviewed with patient goal fasting <130 post prandial <180. Patient verbalized full understanding. Reviewed medications with patient and discussed importance of medication adherence. Reports compliance with all medications Counseled on importance of regular laboratory monitoring as prescribed Discussed plans with patient for ongoing care management follow up and provided patient with direct contact information for care management team Review of patient status, including review of consultants reports, relevant laboratory and other test results, and medications completed Assessed social determinant of health barriers  Lab Results  Component Value Date   HGBA1C 7.1 (A) 12/09/2023    Pain Interventions:  (Status:  Goal on track:  Yes.) Long Term Goal Pain assessment performed. Reports 5/10 pain this morning to her left shoulder. Medications reviewed. Reports that the flexeril that has been provided is helping her some but she still has pain Reviewed provider established plan for pain management Discussed importance of adherence to all scheduled medical appointments Counseled on the importance of reporting any/all new or changed pain symptoms or management strategies to pain management provider Advised patient to report to care team affect of pain on daily activities Discussed use of relaxation techniques and/or diversional activities to assist with pain reduction (distraction, imagery, relaxation, massage, acupressure, TENS, heat, and cold application Reviewed with patient prescribed pharmacological and nonpharmacological pain relief strategies Screening for signs and symptoms of depression related to chronic disease state   Patient Goals/Self-Care Activities: Take all medications as prescribed Attend all scheduled provider appointments Call pharmacy for medication refills 3-7 days  in advance of running out of  medications Perform all self care activities independently  Call provider office for new concerns or questions  call the Suicide and Crisis Lifeline: 988 call the [place] National Suicide Prevention Lifeline: 715-634-5814 or TTY: (281)529-2755 TTY 754-117-0735) to talk to a trained counselor call 1-800-273-TALK (toll free, 24 hour hotline) go to [facility] [address] [place] [phone]) call 911 if experiencing a Mental Health or Behavioral Health Crisis  check feet daily for cuts, sores or redness take the blood sugar log to all doctor visits manage portion size wear comfortable, cotton socks wear comfortable, well-fitting shoes  Follow Up Plan:  Telephone follow up appointment with care management team member scheduled for:  04-08-2024 at 11:00 am            Consent to Services:  Patient was given information about care management services, agreed to services, and gave verbal consent to participate.   Plan: Telephone follow up appointment with care management team member scheduled for:04-08-2024 at 11:00 am  Larey Brick, BSN RN [facility], [facility] RN Care Manager Direct Dial: [phone]  Fax: [phone]

## 2024-03-10 DIAGNOSIS — M5412 Radiculopathy, cervical region: Secondary | ICD-10-CM | POA: Diagnosis not present

## 2024-03-12 ENCOUNTER — Ambulatory Visit (HOSPITAL_COMMUNITY): Payer: Medicare PPO | Admitting: Psychiatry
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
## 2024-03-16 ENCOUNTER — Encounter: Payer: Self-pay | Admitting: Psychiatry

## 2024-03-16 ENCOUNTER — Telehealth (INDEPENDENT_AMBULATORY_CARE_PROVIDER_SITE_OTHER): Payer: Self-pay | Admitting: Psychiatry

## 2024-03-16 DIAGNOSIS — G4700 Insomnia, unspecified: Secondary | ICD-10-CM

## 2024-03-16 DIAGNOSIS — F330 Major depressive disorder, recurrent, mild: Secondary | ICD-10-CM

## 2024-03-16 DIAGNOSIS — F4310 Post-traumatic stress disorder, unspecified: Secondary | ICD-10-CM

## 2024-03-16 NOTE — Patient Instructions (Signed)
 Continue venlafaxine 187.5  mg daily Continue bupropion 450 mg (300 mg + 150 mg) daily Continue Abilify 10 mg daily Continue trazodone 25-50 mg at night as needed for sleep Next appointment: 5/5 at 11 am

## 2024-03-26 ENCOUNTER — Ambulatory Visit (INDEPENDENT_AMBULATORY_CARE_PROVIDER_SITE_OTHER): Payer: Medicare PPO | Admitting: Psychiatry

## 2024-03-26 DIAGNOSIS — F4310 Post-traumatic stress disorder, unspecified: Secondary | ICD-10-CM

## 2024-03-26 DIAGNOSIS — F330 Major depressive disorder, recurrent, mild: Secondary | ICD-10-CM | POA: Diagnosis not present
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
## 2024-04-01 ENCOUNTER — Ambulatory Visit: Admitting: Podiatry

## 2024-04-01 DIAGNOSIS — B351 Tinea unguium: Secondary | ICD-10-CM

## 2024-04-01 MED ORDER — CICLOPIROX 8 % EX SOLN: Freq: Every day | CUTANEOUS | 0 refills | Status: DC

## 2024-04-01 NOTE — Progress Notes (Signed)
 Subjective:  Patient ID: Jenny [name], female    DOB: 1961/01/08,  MRN: [id number]  Chief Complaint  Patient presents with   Toe Pain    Right foot 5th toe pain pt stated she thinks she has a corn     63 y.o. female presents with the above complaint.  Patient presents with thickened and onychodystrophy mycotic toenails x 10 mild pain on palpation worse with ambulation worse with pressure would like to discuss treatment options for nail fungus.  She has a history of fatty liver disease.  Denies any other acute issues   Review of Systems: Negative except as noted in the HPI. Denies N/V/F/Ch.  Past Medical History:  Diagnosis Date   Anemia    Anxiety    Arthritis    Cancer (HCC)    colon cancer    Complication of anesthesia    Hard to wake up   Depression    Diabetes mellitus, type 2 (HCC)    Enlarged thyroid    Fatty liver     Current Outpatient Medications:    ciclopirox (PENLAC) 8 % solution, Apply topically at bedtime. Apply over nail and surrounding skin. Apply daily over previous coat. After seven (7) days, may remove with alcohol and continue cycle., Disp: 6.6 mL, Rfl: 0   acetaminophen (TYLENOL) 650 MG CR tablet, Take 1,300 mg by mouth every 8 (eight) hours as needed for pain., Disp: , Rfl:    ARIPiprazole (ABILIFY) 10 MG tablet, Take 1 tablet (10 mg total) by mouth daily., Disp: 90 tablet, Rfl: 1   atorvastatin (LIPITOR) 20 MG tablet, TAKE 1 TABLET BY MOUTH EVERY DAY, Disp: 90 tablet, Rfl: 3   buPROPion (WELLBUTRIN XL) 150 MG 24 hr tablet, Take 1 tablet (150 mg total) by mouth daily. Take total of 450 mg daily (300 mg + 150 mg), Disp: 90 tablet, Rfl: 1   buPROPion (WELLBUTRIN XL) 300 MG 24 hr tablet, Take 1 tablet (300 mg total) by mouth daily. Take total of 450 mg daily, along with 150 mg daily, Disp: 90 tablet, Rfl: 1   calcium carbonate (TUMS) 500 MG chewable tablet, Chew 2 tablets (400 mg of elemental calcium total) by mouth 3 (three) times daily., Disp: 90 tablet,  Rfl: 1   cyclobenzaprine (FLEXERIL) 10 MG tablet, Take 0.5 tablets (5 mg total) by mouth 2 (two) times daily as needed for muscle spasms. (Patient not taking: Reported on 03/08/2024), Disp: 20 tablet, Rfl: 0   diclofenac Sodium (VOLTAREN) 1 % GEL, Apply 2 g topically 4 (four) times daily. (Patient taking differently: Apply 2 g topically 4 (four) times daily as needed (pain).), Disp: 150 g, Rfl: 4   fluticasone (FLONASE) 50 MCG/ACT nasal spray, Place 2 sprays into both nostrils daily., Disp: 16 g, Rfl: 2   gabapentin (NEURONTIN) 300 MG capsule, Take 1 capsule (300 mg total) by mouth 3 (three) times daily. Take 2-3 capsules before bed (Patient taking differently: Take 300 mg by mouth 2 (two) times daily. Take 2-3 capsules before bed), Disp: 270 capsule, Rfl: 2   hydrocortisone cream 0.5 %, Apply 1 application  topically 2 (two) times daily as needed for itching., Disp: , Rfl:    JARDIANCE 10 MG TABS tablet, TAKE 1 TABLET BY MOUTH EVERY DAY, Disp: 90 tablet, Rfl: 2   levothyroxine (SYNTHROID) 100 MCG tablet, Take 1 tablet (100 mcg total) by mouth daily before breakfast., Disp: 30 tablet, Rfl: 2   loratadine (CLARITIN) 10 MG tablet, Take 1 tablet (10 mg  total) by mouth daily. AS NEEDED (Patient not taking: Reported on 03/08/2024), Disp: 90 tablet, Rfl: 1   metFORMIN (GLUCOPHAGE-XR) 500 MG 24 hr tablet, TAKE 2 TABLETS BY MOUTH TWICE A DAY, Disp: 360 tablet, Rfl: 2   Multiple Vitamin (MULTIVITAMIN) tablet, Take 1 tablet by mouth daily., Disp: , Rfl:    Semaglutide, 2 MG/DOSE, 8 MG/3ML SOPN, Inject 2 mg into the skin once a week., Disp: 9 mL, Rfl: 6   traZODone (DESYREL) 50 MG tablet, Take 1 tablet (50 mg total) by mouth at bedtime as needed for sleep., Disp: 90 tablet, Rfl: 1   venlafaxine XR (EFFEXOR-XR) 150 MG 24 hr capsule, Take 1 capsule (150 mg total) by mouth daily. Total of 187.5 mg daily. Take along with 37.5 mg cap, Disp: 90 capsule, Rfl: 1   venlafaxine XR (EFFEXOR-XR) 37.5 MG 24 hr capsule, Take 1  capsule (37.5 mg total) by mouth daily. TAKE 1 CAPSULE BY MOUTH DAILY ALONG WITH THE 150 MG FOR TOTAL DAILY DOSE OF 187.5 MG, Disp: 90 capsule, Rfl: 1  Social History   Tobacco Use  Smoking Status Never   Passive exposure: Never  Smokeless Tobacco Never    Allergies  Allergen Reactions   Other Rash    *Derma Bond*   Attends Briefs Small Other (See Comments)   Shellfish Allergy Rash   Objective:  There were no vitals filed for this visit. There is no height or weight on file to calculate BMI. Constitutional Well developed. Well nourished.  Vascular Dorsalis pedis pulses palpable bilaterally. Posterior tibial pulses palpable bilaterally. Capillary refill normal to all digits.  No cyanosis or clubbing noted. Pedal hair growth normal.  Neurologic Normal speech. Oriented to person, place, and time. Epicritic sensation to light touch grossly present bilaterally.  Dermatologic Nails thickened onychodystrophy mycotic nail x 10 Skin within normal limits  Orthopedic: Normal joint ROM without pain or crepitus bilaterally. No visible deformities. No bony tenderness.   Radiographs: None Assessment:   1. Onychomycosis due to dermatophyte   2. Nail fungus    Plan:  Patient was evaluated and treated and all questions answered.  Onychomycosis toenails x 10 -Educated the patient on the etiology of onychomycosis and various treatment options associated with improving the fungal load.  I explained to the patient that there is 3 treatment options available to treat the onychomycosis including topical, p.o., laser treatment.  Patient elected to undergo topical medication with Penlac advised her to apply twice a day.  She states understanding Penlac was sent to the pharmacy   No follow-ups on file.

## 2024-04-08 ENCOUNTER — Ambulatory Visit: Payer: Self-pay

## 2024-04-08 ENCOUNTER — Other Ambulatory Visit: Payer: Self-pay

## 2024-04-08 MED ORDER — LEVOTHYROXINE SODIUM 100 MCG PO TABS
100.0000 ug | ORAL_TABLET | Freq: Every day | ORAL | 2 refills | Status: DC
Start: 2024-04-08 — End: 2024-05-07

## 2024-04-08 NOTE — Patient Instructions (Signed)
 Visit Information  Thank you for taking time to visit with me today. Please don't hesitate to contact me if I can be of assistance to you before our next scheduled appointment.  Your next care management appointment is by telephone on 05/11/24 at 1030 am  Telephone follow-up in 1 month  Please call the care guide team at [phone] if you need to cancel, schedule, or reschedule an appointment.   Please call [phone]-TALK (toll free, 24 hour hotline) if you are experiencing a Mental Health or Behavioral Health Crisis or need someone to talk to.  Augustin Leber RN, BSN, [facility] [place]  [facility]  Care Coordinator Phone: [phone]

## 2024-04-08 NOTE — Patient Outreach (Signed)
 Complex Care Management   Visit Note  04/08/2024  Name:  Jenny [name] MRN: [id number] DOB: 15-Sep-1961  Situation: Referral received for Complex Care Management related to Diabetes  I obtained verbal consent from Patient.  Visit completed with patient  on the phone  Background:   Past Medical History:  Diagnosis Date   Anemia    Anxiety    Arthritis    Cancer (HCC)    colon cancer    Complication of anesthesia    Hard to wake up   Depression    Diabetes mellitus, type 2 (HCC)    Enlarged thyroid    Fatty liver     Assessment: Patient Reported Symptoms:  Cognitive Cognitive Status: Able to follow simple commands, Alert and oriented to person, place, and time, Insightful and able to interpret abstract concepts      Neurological      HEENT HEENT Symptoms Reported: No symptoms reported      Cardiovascular Cardiovascular Symptoms Reported: Dizziness Cardiovascular Management Strategies: Adequate rest Cardiovascular Self-Management Outcome: 3 (uncertain)  Respiratory Respiratory Symptoms Reported: No symptoms reported Respiratory Self-Management Outcome: 3 (uncertain)  Endocrine Is patient diabetic?: Yes Is patient checking blood sugars at home?: Yes Endocrine Conditions: Diabetes Endocrine Management Strategies: Medical device Endocrine Self-Management Outcome: 4 (good)  Gastrointestinal   Gastrointestinal Conditions: Constipation Gastrointestinal Management Strategies: Medication therapy Gastrointestinal Self-Management Outcome: 4 (good)    Genitourinary   Genitourinary Self-Management Outcome: 3 (uncertain)  Integumentary Integumentary Symptoms Reported: No symptoms reported    Musculoskeletal Musculoskelatal Symptoms Reviewed: No symptoms reported   Falls in the past year?: No    Psychosocial Psychosocial Symptoms Reported: No symptoms reported            04/08/2024   11:21 AM  Depression screen PHQ 2/9  Decreased Interest 1  Down, Depressed, Hopeless  1  PHQ - 2 Score 2  Altered sleeping 2  Tired, decreased energy 2  Change in appetite 1  Feeling bad or failure about yourself  1  Trouble concentrating 1  Moving slowly or fidgety/restless 2  Suicidal thoughts 0  PHQ-9 Score 11  Difficult doing work/chores Not difficult at all    There were no vitals filed for this visit.  Medications Reviewed Today     Reviewed by Juanell Fairly, RN (Registered Nurse) on 04/08/24 at 1111  Med List Status: <None>   Medication Order Taking? Sig Documenting Provider Last Dose Status Informant  acetaminophen (TYLENOL) 650 MG CR tablet [id number] Yes Take 1,300 mg by mouth every 8 (eight) hours as needed for pain. [provider] Taking Active Self  ARIPiprazole (ABILIFY) 10 MG tablet [id number] Yes Take 1 tablet (10 mg total) by mouth daily. [name], MD Taking Active Self  atorvastatin (LIPITOR) 20 MG tablet [id number] Yes TAKE 1 TABLET BY MOUTH EVERY DAY Chambliss, Estill Batten, MD Taking Active   buPROPion (WELLBUTRIN XL) 150 MG 24 hr tablet [id number] Yes Take 1 tablet (150 mg total) by mouth daily. Take total of 450 mg daily (300 mg + 150 mg) Hisada, Reina, MD Taking Active Self  buPROPion (WELLBUTRIN XL) 300 MG 24 hr tablet [id number] Yes Take 1 tablet (300 mg total) by mouth daily. Take total of 450 mg daily, along with 150 mg daily Hisada, Barbee Cough, MD Taking Active Self  calcium carbonate (TUMS) 500 MG chewable tablet [id number] Yes Chew 2 tablets (400 mg of elemental calcium total) by mouth 3 (three) times daily. Darnell Level, MD Taking Active   ciclopirox [facility]) 8 %  solution [id number] Yes Apply topically at bedtime. Apply over nail and surrounding skin. Apply daily over previous coat. After seven (7) days, may remove with alcohol and continue cycle. Velma Ghazi, DPM Taking Active   cyclobenzaprine (FLEXERIL) 10 MG tablet [id number] No Take 0.5 tablets (5 mg total) by mouth 2 (two) times daily as needed for muscle spasms.  Patient not taking:  Reported on 04/08/2024   Candee Cha, MD Not Taking Active   diclofenac Sodium (VOLTAREN) 1 % GEL [id number] Yes Apply 2 g topically 4 (four) times daily.  Patient taking differently: Apply 2 g topically 4 (four) times daily as needed (pain).   Blinda Burger, NP Taking Active Self  fluticasone (FLONASE) 50 MCG/ACT nasal spray [id number] Yes Place 2 sprays into both nostrils daily. Marceil Sensor, NP Taking Active Self  gabapentin (NEURONTIN) 300 MG capsule [id number] Yes Take 1 capsule (300 mg total) by mouth 3 (three) times daily. Take 2-3 capsules before bed  Patient taking differently: Take 300 mg by mouth 2 (two) times daily. Take 2-3 capsules before bed   Marveen Slick, MD Taking Active Self  hydrocortisone cream 0.5 % [id number] Yes Apply 1 application  topically 2 (two) times daily as needed for itching. [provider] Taking Active Self  JARDIANCE 10 MG TABS tablet [id number] Yes TAKE 1 TABLET BY MOUTH EVERY DAY Azell Boll, MD Taking Active   levothyroxine (SYNTHROID) 100 MCG tablet [id number] Yes Take 1 tablet (100 mcg total) by mouth daily before breakfast. Oralee Billow, MD Taking Active   loratadine (CLARITIN) 10 MG tablet [id number] No Take 1 tablet (10 mg total) by mouth daily. AS NEEDED  Patient not taking: Reported on 03/08/2024   Marveen Slick, MD Not Taking Active Self  metFORMIN (GLUCOPHAGE-XR) 500 MG 24 hr tablet [id number] Yes TAKE 2 TABLETS BY MOUTH TWICE A DAY Chambliss, Vesta Gourd, MD Taking Active Self  Multiple Vitamin (MULTIVITAMIN) tablet [id number] Yes Take 1 tablet by mouth daily. [provider] Taking Active Self  Semaglutide, 2 MG/DOSE, 8 MG/3ML SOPN [id number] Yes Inject 2 mg into the skin once a week. Marveen Slick, MD Taking Active Self  traZODone (DESYREL) 50 MG tablet [id number] Yes Take 1 tablet (50 mg total) by mouth at bedtime as needed for sleep. Todd Fossa, MD Taking Active Self  venlafaxine XR  (EFFEXOR-XR) 150 MG 24 hr capsule [id number] Yes Take 1 capsule (150 mg total) by mouth daily. Total of 187.5 mg daily. Take along with 37.5 mg cap Hisada, Reina, MD Taking Active Self  venlafaxine XR (EFFEXOR-XR) 37.5 MG 24 hr capsule [id number] Yes Take 1 capsule (37.5 mg total) by mouth daily. TAKE 1 CAPSULE BY MOUTH DAILY ALONG WITH THE 150 MG FOR TOTAL DAILY DOSE OF 187.5 MG Hisada, Reina, MD Taking Active             Recommendation:   PCP Follow-up  Follow Up Plan:   Telephone follow-up in 1 month  Edwena Graham, BSN, [facility] [place]  [facility]  Care Coordinator Phone: [phone]
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
## 2024-04-21 ENCOUNTER — Ambulatory Visit (HOSPITAL_COMMUNITY): Admitting: Psychiatry

## 2024-04-21 DIAGNOSIS — F4310 Post-traumatic stress disorder, unspecified: Secondary | ICD-10-CM | POA: Diagnosis not present

## 2024-04-21 DIAGNOSIS — F330 Major depressive disorder, recurrent, mild: Secondary | ICD-10-CM
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
## 2024-04-26 ENCOUNTER — Other Ambulatory Visit: Payer: Self-pay

## 2024-04-26 ENCOUNTER — Ambulatory Visit (INDEPENDENT_AMBULATORY_CARE_PROVIDER_SITE_OTHER): Admitting: Psychiatry

## 2024-04-26 ENCOUNTER — Encounter: Payer: Self-pay | Admitting: Psychiatry

## 2024-04-26 VITALS — BP 119/80 | HR 81 | Temp 97.1°F | Ht 64.0 in | Wt 234.4 lb

## 2024-04-26 DIAGNOSIS — F330 Major depressive disorder, recurrent, mild: Secondary | ICD-10-CM

## 2024-04-26 DIAGNOSIS — F4310 Post-traumatic stress disorder, unspecified: Secondary | ICD-10-CM | POA: Diagnosis not present

## 2024-04-26 DIAGNOSIS — G4700 Insomnia, unspecified: Secondary | ICD-10-CM | POA: Diagnosis not present

## 2024-04-26 MED ORDER — BUPROPION HCL ER (XL) 300 MG PO TB24
300.0000 mg | ORAL_TABLET | Freq: Every day | ORAL | 1 refills | Status: DC
Start: 2024-04-26 — End: 2024-05-07

## 2024-04-26 MED ORDER — TRAZODONE HCL 50 MG PO TABS: 50.0000 mg | ORAL_TABLET | Freq: Every evening | ORAL | 1 refills | Status: DC | PRN

## 2024-04-26 MED ORDER — BUPROPION HCL ER (XL) 150 MG PO TB24: 150.0000 mg | ORAL_TABLET | Freq: Every day | ORAL | 1 refills | Status: DC

## 2024-04-26 NOTE — Patient Instructions (Signed)
 Continue venlafaxine  187.5  mg daily  Continue bupropion  450 mg (300 mg + 150 mg) daily Continue Abilify  10 mg daily  Continue trazodone  25-50 mg at night as needed for sleep Next appointment: 6/18 at 9:30

## 2024-05-07 ENCOUNTER — Other Ambulatory Visit: Payer: Self-pay

## 2024-05-07 DIAGNOSIS — F330 Major depressive disorder, recurrent, mild: Secondary | ICD-10-CM

## 2024-05-07 DIAGNOSIS — F4310 Post-traumatic stress disorder, unspecified: Secondary | ICD-10-CM

## 2024-05-07 MED ORDER — VENLAFAXINE HCL ER 150 MG PO CP24: 150.0000 mg | ORAL_CAPSULE | Freq: Every day | ORAL | 1 refills | Status: DC

## 2024-05-07 MED ORDER — LEVOTHYROXINE SODIUM 100 MCG PO TABS: 100.0000 ug | ORAL_TABLET | Freq: Every day | ORAL | 2 refills | Status: DC

## 2024-05-07 MED ORDER — METFORMIN HCL ER 500 MG PO TB24: 1000.0000 mg | ORAL_TABLET | Freq: Two times a day (BID) | ORAL | 2 refills | Status: DC

## 2024-05-07 MED ORDER — ATORVASTATIN CALCIUM 20 MG PO TABS: 20.0000 mg | ORAL_TABLET | Freq: Every day | ORAL | 3 refills | Status: AC

## 2024-05-07 MED ORDER — BUPROPION HCL ER (XL) 150 MG PO TB24: 150.0000 mg | ORAL_TABLET | Freq: Every day | ORAL | 1 refills | Status: DC

## 2024-05-07 MED ORDER — VENLAFAXINE HCL ER 37.5 MG PO CP24
37.5000 mg | ORAL_CAPSULE | Freq: Every day | ORAL | 1 refills | Status: DC
Start: 2024-05-07 — End: 2024-09-22

## 2024-05-07 MED ORDER — BUPROPION HCL ER (XL) 300 MG PO TB24: 300.0000 mg | ORAL_TABLET | Freq: Every day | ORAL | 1 refills | Status: DC

## 2024-05-07 NOTE — Telephone Encounter (Signed)
 Please call patient--Wellbutrin  was refilled by Psychiatry last week--please confirm she wants a change in pharmacy to mail order for all of this?  Otho Blitz, MD  Family Medicine Teaching Service

## 2024-05-11 ENCOUNTER — Other Ambulatory Visit: Payer: Self-pay

## 2024-05-11 NOTE — Patient Outreach (Signed)
 Complex Care Management   Visit Note  05/11/2024  Name:  Jenny [name] MRN: [id number] DOB: 05/17/1961  Situation: Referral received for Complex Care Management related to Diabetes  I obtained verbal consent from Patient.  Visit completed with patient  on the phone  Background:   Past Medical History:  Diagnosis Date   Anemia    Anxiety    Arthritis    Cancer (HCC)    colon cancer    Complication of anesthesia    Hard to wake up   Depression    Diabetes mellitus, type 2 (HCC)    Enlarged thyroid     Fatty liver     Assessment: Patient Reported Symptoms:  Cognitive Cognitive Status: Able to follow simple commands, Alert and oriented to person, place, and time, Insightful and able to interpret abstract concepts, Normal speech and language skills      Neurological Neurological Review of Symptoms: Headaches Neurological Conditions: Headache Neurological Management Strategies: Medication therapy  HEENT HEENT Symptoms Reported: Tinnitus HEENT Conditions: Ear problem(s) HEENT Management Strategies: Routine screening Ear problem(s)  Cardiovascular Cardiovascular Symptoms Reported: Lightheadness Does patient have uncontrolled Hypertension?: No Cardiovascular Conditions: Hypertension Cardiovascular Management Strategies: Medication therapy  Respiratory Respiratory Symptoms Reported: No symptoms reported    Endocrine Is patient diabetic?: Yes Is patient checking blood sugars at home?: Yes Endocrine Conditions: Diabetes Endocrine Management Strategies: Medication therapy, Medical device  Gastrointestinal Gastrointestinal Symptoms Reported: Abdominal pain or discomfort, Constipation, Diarrhea Gastrointestinal Conditions: Constipation, Abdominal pain, Diarrhea Gastrointestinal Management Strategies: Medication therapy Gastrointestinal Comment: sometime feels likes knots in her stomach and she has to wait for it to release    Genitourinary Genitourinary Symptoms Reported:  Urgency Genitourinary Conditions: Other Other Genitourinary Conditions: has to go right when she feelss the need to go Genitourinary Management Strategies: Coping strategies, Incontinence garment/pad  Integumentary Integumentary Symptoms Reported: No symptoms reported    Musculoskeletal Musculoskelatal Symptoms Reviewed: Difficulty walking Musculoskeletal Conditions: Joint pain, Back pain Musculoskeletal Management Strategies: Medication therapy Falls in the past year?: Yes Number of falls in past year: 2 or more Was there an injury with Fall?: No Fall Risk Category Calculator: 2 Patient Fall Risk Level: Moderate Fall Risk Patient at Risk for Falls Due to: Other (Comment), Impaired balance/gait (Just woke up and was not fully awake) Fall risk Follow up: Falls evaluation completed, Education provided, Falls prevention discussed  Psychosocial       Quality of Family Relationships: supportive Do you feel physically threatened by others?: No      05/11/2024   11:11 AM  Depression screen PHQ 2/9  Decreased Interest 1  Down, Depressed, Hopeless 1  PHQ - 2 Score 2  Altered sleeping 3  Tired, decreased energy 3  Change in appetite 1  Feeling bad or failure about yourself  1  Trouble concentrating 1  Moving slowly or fidgety/restless 1  Suicidal thoughts 1  PHQ-9 Score 13    There were no vitals filed for this visit.  Medications Reviewed Today     Reviewed by Augustin Leber, RN (Registered Nurse) on 05/11/24 at 1045  Med List Status: <None>   Medication Order Taking? Sig Documenting Provider Last Dose Status Informant  acetaminophen  (TYLENOL ) 650 MG CR tablet [id number] Yes Take 1,300 mg by mouth every 8 (eight) hours as needed for pain. [provider] Taking Active Self  ARIPiprazole  (ABILIFY ) 10 MG tablet [id number] Yes Take 1 tablet (10 mg total) by mouth daily. Todd Fossa, MD Taking Active Self  atorvastatin  (LIPITOR) 20 MG tablet  [id number] Yes Take 1 tablet (20 mg  total) by mouth daily. Azell Boll, MD Taking Active   buPROPion  (WELLBUTRIN  XL) 150 MG 24 hr tablet [id number] Yes Take 1 tablet (150 mg total) by mouth daily. Take total of 450 mg daily (300 mg + 150 mg) Azell Boll, MD Taking Active   buPROPion  (WELLBUTRIN  XL) 300 MG 24 hr tablet [id number] Yes Take 1 tablet (300 mg total) by mouth daily. Take total of 450 mg daily, along with 150 mg daily Azell Boll, MD Taking Active   calcium  carbonate (TUMS) 500 MG chewable tablet [id number] Yes Chew 2 tablets (400 mg of elemental calcium  total) by mouth 3 (three) times daily. Oralee Billow, MD Taking Active   ciclopirox  (PENLAC ) 8 % solution [id number] Yes Apply topically at bedtime. Apply over nail and surrounding skin. Apply daily over previous coat. After seven (7) days, may remove with alcohol and continue cycle. Velma Ghazi, DPM Taking Active   cyclobenzaprine  (FLEXERIL ) 10 MG tablet [id number] Yes Take 0.5 tablets (5 mg total) by mouth 2 (two) times daily as needed for muscle spasms. Candee Cha, MD Taking Active   diclofenac  Sodium (VOLTAREN ) 1 % GEL [id number] Yes Apply 2 g topically 4 (four) times daily.  Patient taking differently: Apply 2 g topically 4 (four) times daily as needed (pain).   Blinda Burger, NP Taking Active Self  fluticasone  (FLONASE ) 50 MCG/ACT nasal spray [id number] Yes Place 2 sprays into both nostrils daily. Marceil Sensor, NP Taking Active Self  gabapentin  (NEURONTIN ) 300 MG capsule [id number] Yes Take 1 capsule (300 mg total) by mouth 3 (three) times daily. Take 2-3 capsules before bed  Patient taking differently: Take 300 mg by mouth 2 (two) times daily. Take 2-3 capsules before bed   Marveen Slick, MD Taking Active Self  hydrocortisone  cream 0.5 % [id number] Yes Apply 1 application  topically 2 (two) times daily as needed for itching. [provider] Taking Active Self  JARDIANCE  10 MG TABS tablet [id number] Yes TAKE 1 TABLET BY MOUTH  EVERY DAY Azell Boll, MD Taking Active   levothyroxine  (SYNTHROID ) 100 MCG tablet [id number] Yes Take 1 tablet (100 mcg total) by mouth daily before breakfast. Azell Boll, MD Taking Active   loratadine  (CLARITIN ) 10 MG tablet [id number] Yes Take 1 tablet (10 mg total) by mouth daily. AS NEEDED Marveen Slick, MD Taking Active Self  metFORMIN  (GLUCOPHAGE -XR) 500 MG 24 hr tablet [id number] Yes Take 2 tablets (1,000 mg total) by mouth 2 (two) times daily. Azell Boll, MD Taking Active   Multiple Vitamin (MULTIVITAMIN) tablet [id number] Yes Take 1 tablet by mouth daily. [provider] Taking Active Self  Semaglutide , 2 MG/DOSE, 8 MG/3ML SOPN [id number] Yes Inject 2 mg into the skin once a week. Marveen Slick, MD Taking Active Self  traZODone  (DESYREL ) 50 MG tablet [id number] Yes Take 1 tablet (50 mg total) by mouth at bedtime as needed for sleep. Todd Fossa, MD Taking Active   venlafaxine  XR (EFFEXOR -XR) 150 MG 24 hr capsule [id number] Yes Take 1 capsule (150 mg total) by mouth daily. Total of 187.5 mg daily. Take along with 37.5 mg cap Brown, Carina M, MD Taking Active   venlafaxine  XR (EFFEXOR -XR) 37.5 MG 24 hr capsule [id number] Yes Take 1 capsule (37.5 mg total) by mouth daily. TAKE 1 CAPSULE BY MOUTH DAILY ALONG WITH THE 150 MG FOR TOTAL DAILY DOSE OF 187.5 MG Azell Boll, MD Taking Active  Recommendation:   PCP Follow-up  Follow Up Plan:   Telephone follow up appointment date/time:  06/11/24  11 am  Augustin Leber RN, BSN, [facility]  [facility], [facility]  Care Coordinator Phone: [phone]

## 2024-05-11 NOTE — Patient Instructions (Signed)
 Visit Information  Thank you for taking time to visit with me today. Please don't hesitate to contact me if I can be of assistance to you before our next scheduled appointment.  Your next care management appointment is Telephone follow up appointment date/time:  06/11/24  11 am  Please call the care guide team at [phone] if you need to cancel, schedule, or reschedule an appointment.   Please call [phone]-TALK (toll free, 24 hour hotline) if you are experiencing a Mental Health or Behavioral Health Crisis or need someone to talk to.  Augustin Leber RN, BSN, [facility]   [facility], [facility]  Care Coordinator Phone: [phone]

## 2024-05-12 ENCOUNTER — Ambulatory Visit (HOSPITAL_COMMUNITY): Admitting: Psychiatry

## 2024-05-12 DIAGNOSIS — M542 Cervicalgia: Secondary | ICD-10-CM | POA: Diagnosis not present

## 2024-05-12 DIAGNOSIS — Z981 Arthrodesis status: Secondary | ICD-10-CM | POA: Diagnosis not present

## 2024-05-13 ENCOUNTER — Other Ambulatory Visit (HOSPITAL_COMMUNITY): Payer: Self-pay

## 2024-05-13 NOTE — Progress Notes (Signed)
    SUBJECTIVE:   CHIEF COMPLAINT: check up on diabetes HPI:   Jenny [name] is a 63 y.o.  with history notable for type 2 DM  presenting for check up .  Leg pain Reports worsening lateral leg pain. Different than neuropathic pain from chemotherapy. Has aching in anterior/lateral calf with walking. Improves with rest. No trauma. Voltaren  does not help. No numbness or tingling. Does have 'stabbing' anterior knee pain at times but different than aching with ambulation.   Mood is okay. Follows with Dr. Edda Goo. Still finds herself upset with herself.   Diabetes A1C 6.9.  Smoking status: None smoker Statin: Atorvastatin   ACE/ARB if HTN: NA Albuminuria: No Metformin : Yes Current medications: Jardiance  and Ozempic  Reports multiple dietary changes. Still struggling with weight. Interested in Mounjaro for better A1C control.     PERTINENT  PMH / PSH/Family/Social History : type 2 DM, PTSD   OBJECTIVE:   BP 123/83   Pulse 76   Ht 5\' 4"  (1.626 m)   Wt 233 lb 12.8 oz (106.1 kg)   SpO2 100%   BMI 40.13 kg/m   Today's weight:  Last Weight  Most recent update: 05/14/2024 11:10 AM    Weight  106.1 kg (233 lb 12.8 oz)            Review of prior weights: Filed Weights   05/14/24 1108  Weight: 233 lb 12.8 oz (106.1 kg)      Cardiac: Regular rate and rhythm. Normal S1/S2. No murmurs, rubs, or gallops appreciated. Lungs: Clear bilaterally to ascultation.  Abdomen: Normoactive bowel sounds. No tenderness to deep or light palpation. No rebound or guarding.  No masses Bilateral leg exam No hair  Pulses 1+ Bilateral knees without deformity, no medial joint line tenderness, no crepitus    ASSESSMENT/PLAN:   Assessment & Plan Type 2 diabetes mellitus without complication, without long-term current use of insulin  (HCC) A1C at goal Has not had desired weight loss with Ozempic  Discussed, transition to Mounjaro  PCV 20 day, UTD on foot exam  Hypercholesteremia Had steatosis on  ultrasound Hepatic panel today Bilateral leg pain ? Not consistent with neuropathic pain  DDX includes PAD, OA, statin intolerance CK, Vascular surgery referral for ABI, bilateral knee  xrays  Voltaren  gel for pain  Leg cramp As above Encounter for immunization PCV 20  Pulmonary artery abnormality Resolved. Has echo follow up with normal PA       Otho Blitz, MD  Family Medicine Teaching Service  [facility]

## 2024-05-14 ENCOUNTER — Ambulatory Visit (INDEPENDENT_AMBULATORY_CARE_PROVIDER_SITE_OTHER): Admitting: Family Medicine

## 2024-05-14 ENCOUNTER — Other Ambulatory Visit: Payer: Self-pay | Admitting: Family Medicine

## 2024-05-14 ENCOUNTER — Ambulatory Visit
Admission: RE | Admit: 2024-05-14 | Discharge: 2024-05-14 | Disposition: A | Discharge status: Home / Self Care | Source: Ambulatory Visit | Attending: Family Medicine | Admitting: Family Medicine

## 2024-05-14 ENCOUNTER — Encounter: Payer: Self-pay | Admitting: Family Medicine

## 2024-05-14 ENCOUNTER — Other Ambulatory Visit: Payer: Self-pay

## 2024-05-14 VITALS — BP 123/83 | HR 76 | Ht 64.0 in | Wt 233.8 lb

## 2024-05-14 DIAGNOSIS — Z23 Encounter for immunization: Secondary | ICD-10-CM

## 2024-05-14 DIAGNOSIS — M79605 Pain in left leg: Secondary | ICD-10-CM

## 2024-05-14 DIAGNOSIS — M79604 Pain in right leg: Secondary | ICD-10-CM

## 2024-05-14 DIAGNOSIS — R252 Cramp and spasm: Secondary | ICD-10-CM | POA: Diagnosis not present

## 2024-05-14 DIAGNOSIS — E7800 Pure hypercholesterolemia, unspecified: Secondary | ICD-10-CM | POA: Diagnosis not present

## 2024-05-14 DIAGNOSIS — E119 Type 2 diabetes mellitus without complications: Secondary | ICD-10-CM

## 2024-05-14 DIAGNOSIS — Q2579 Other congenital malformations of pulmonary artery: Secondary | ICD-10-CM

## 2024-05-14 DIAGNOSIS — M25552 Pain in left hip: Secondary | ICD-10-CM | POA: Diagnosis not present

## 2024-05-14 DIAGNOSIS — M25551 Pain in right hip: Secondary | ICD-10-CM | POA: Diagnosis not present

## 2024-05-14 DIAGNOSIS — M160 Bilateral primary osteoarthritis of hip: Secondary | ICD-10-CM | POA: Diagnosis not present

## 2024-05-14 LAB — POCT GLYCOSYLATED HEMOGLOBIN (HGB A1C): HbA1c, POC (controlled diabetic range): 6.9 % (ref 0.0–7.0)

## 2024-05-14 MED ORDER — CICLOPIROX 8 % EX SOLN: Freq: Every day | CUTANEOUS | 0 refills | Status: AC

## 2024-05-14 MED ORDER — MOUNJARO 2.5 MG/0.5ML ~~LOC~~ SOAJ: 2.5000 mg | SUBCUTANEOUS | 1 refills | Status: DC

## 2024-05-14 MED ORDER — GABAPENTIN 300 MG PO CAPS: 300.0000 mg | ORAL_CAPSULE | Freq: Three times a day (TID) | ORAL | 2 refills | Status: DC

## 2024-05-14 MED ORDER — EMPAGLIFLOZIN 10 MG PO TABS: 10.0000 mg | ORAL_TABLET | Freq: Every day | ORAL | 2 refills | Status: AC

## 2024-05-14 NOTE — Assessment & Plan Note (Signed)
 Had steatosis on ultrasound Hepatic panel today

## 2024-05-14 NOTE — Assessment & Plan Note (Addendum)
 A1C at goal Has not had desired weight loss with Ozempic  Discussed, transition to Mounjaro  PCV 20 day, UTD on foot exam

## 2024-05-14 NOTE — Patient Instructions (Signed)
 It was wonderful to see you today.  Please bring ALL of your medications with you to every visit.   Today we talked about:  - Continue your Ozempic  for now  - I sent your Florence Hunt   When you receive the Mounjaro from your pharmacy Call our office You MUST space timing between Ozempic  and Mounjaor by 1 week   The starting dose of Mounjaro is 2.5 mg--you taken this for one month then increase to 5 mg--you will need to follow up in 14month after your start the  Mounjaro   For your legs - This could be arthritis. We are checking other tests.  An x-ray was ordered for you---you do not need an appointment to have this completed.  I recommend going to [facility] [address] [place]  If the results are normal,I will send you a letter  I will call you with results if anything is abnormal    I have referred you to Vascular Surgery  to further evaluate your concern. If you have not received a phone call about this appointment within 3-4 weeks, please call our office back at [phone]. Rosanna Comment coordinates our referrals and can assist you in this.    Please follow up in 1 months for Mounjaro   Thank you for choosing [facility].   Please call [phone] with any questions about today's appointment.  Please be sure to schedule follow up at the front  desk before you leave today.   Otho Blitz, MD  Family Medicine

## 2024-05-14 NOTE — Assessment & Plan Note (Signed)
 Resolved. Has echo follow up with normal PA

## 2024-05-15 ENCOUNTER — Ambulatory Visit: Payer: Self-pay | Admitting: Family Medicine

## 2024-05-15 LAB — HEPATIC FUNCTION PANEL
ALT: 14 IU/L (ref 0–32)
AST: 13 IU/L (ref 0–40)
Albumin: 4.6 g/dL (ref 3.8–4.9)
Alkaline Phosphatase: 79 IU/L (ref 44–121)
Bilirubin Total: 0.5 mg/dL (ref 0.0–1.2)
Bilirubin, Direct: 0.22 mg/dL (ref 0.00–0.40)
Total Protein: 7.1 g/dL (ref 6.0–8.5)

## 2024-05-15 LAB — BASIC METABOLIC PANEL WITH GFR
BUN/Creatinine Ratio: 22 (ref 12–28)
BUN: 14 mg/dL (ref 8–27)
CO2: 21 mmol/L (ref 20–29)
Calcium: 9.2 mg/dL (ref 8.7–10.3)
Chloride: 105 mmol/L (ref 96–106)
Creatinine, Ser: 0.64 mg/dL (ref 0.57–1.00)
Glucose: 134 mg/dL — ABNORMAL HIGH (ref 70–99)
Potassium: 4 mmol/L (ref 3.5–5.2)
Sodium: 142 mmol/L (ref 134–144)
eGFR: 101 mL/min/{1.73_m2} (ref >59)

## 2024-05-15 LAB — CK: Total CK: 171 U/L (ref 32–182)

## 2024-05-26 ENCOUNTER — Ambulatory Visit (INDEPENDENT_AMBULATORY_CARE_PROVIDER_SITE_OTHER): Admitting: Psychiatry

## 2024-05-26 DIAGNOSIS — F4310 Post-traumatic stress disorder, unspecified: Secondary | ICD-10-CM | POA: Diagnosis not present

## 2024-05-26 DIAGNOSIS — F330 Major depressive disorder, recurrent, mild: Secondary | ICD-10-CM | POA: Diagnosis not present
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
## 2024-05-28 ENCOUNTER — Other Ambulatory Visit: Payer: Self-pay | Admitting: *Deleted

## 2024-05-28 DIAGNOSIS — M79605 Pain in left leg: Secondary | ICD-10-CM
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
## 2024-06-09 ENCOUNTER — Telehealth (INDEPENDENT_AMBULATORY_CARE_PROVIDER_SITE_OTHER): Admitting: Psychiatry

## 2024-06-09 ENCOUNTER — Encounter: Payer: Self-pay | Admitting: Psychiatry

## 2024-06-09 DIAGNOSIS — F4310 Post-traumatic stress disorder, unspecified: Secondary | ICD-10-CM | POA: Diagnosis not present

## 2024-06-09 DIAGNOSIS — F330 Major depressive disorder, recurrent, mild: Secondary | ICD-10-CM

## 2024-06-09 DIAGNOSIS — G4700 Insomnia, unspecified: Secondary | ICD-10-CM | POA: Diagnosis not present

## 2024-06-09 NOTE — Patient Instructions (Signed)
 Continue venlafaxine  187.5  mg daily  Continue bupropion  450 mg (300 mg + 150 mg) daily Continue Abilify  10 mg daily  Continue trazodone  25-50 mg at night as needed for sleep Next appointment: 8/13 at 9 am

## 2024-06-10 ENCOUNTER — Ambulatory Visit (HOSPITAL_COMMUNITY)
Admission: RE | Admit: 2024-06-10 | Discharge: 2024-06-10 | Disposition: A | Discharge status: Home / Self Care | Source: Ambulatory Visit | Attending: Vascular Surgery | Admitting: Vascular Surgery

## 2024-06-10 DIAGNOSIS — M79604 Pain in right leg: Secondary | ICD-10-CM

## 2024-06-10 DIAGNOSIS — M79605 Pain in left leg: Secondary | ICD-10-CM | POA: Diagnosis not present

## 2024-06-10 LAB — VAS US ABI WITH/WO TBI
Left ABI: 1.14
Right ABI: 1.13

## 2024-06-11 ENCOUNTER — Other Ambulatory Visit: Payer: Self-pay

## 2024-06-11 NOTE — Patient Instructions (Signed)
 Visit Information  Thank you for taking time to visit with me today. Please don't hesitate to contact me if I can be of assistance to you before our next scheduled appointment.  Your next care management appointment is by telephone on 07/13/24 at 11 am   Please call the care guide team at [phone] if you need to cancel, schedule, or reschedule an appointment.   Please call 1-800-273-TALK (toll free, 24 hour hotline) call 911 if you are experiencing a Mental Health or Behavioral Health Crisis or need someone to talk to.   Augustin Leber RN, BSN, [facility] [place]  [facility], [facility]   Care Coordinator Phone: [phone]

## 2024-06-11 NOTE — Patient Outreach (Signed)
 Complex Care Management   Visit Note  06/11/2024  Name:  Jenny [name] MRN: [id number] DOB: 03-05-1961  Situation: Referral received for Complex Care Management related to Diabetes with Complications I obtained verbal consent from Patient.  Visit completed with patient  on the phone  Background:   Past Medical History:  Diagnosis Date   Anemia    Anxiety    Arthritis    Cancer (HCC)    colon cancer    Complication of anesthesia    Hard to wake up   Depression    Diabetes mellitus, type 2 (HCC)    Enlarged thyroid     Fatty liver     Assessment: Patient Reported Symptoms:  Cognitive Cognitive Status: Able to follow simple commands, Alert and oriented to person, place, and time, Normal speech and language skills      Neurological Neurological Review of Symptoms: Headaches Neurological Conditions: Headache Neurological Management Strategies: Medication therapy  HEENT HEENT Symptoms Reported: Tinnitus HEENT Conditions: Ear problem(s) HEENT Management Strategies: Routine screening Ear problem(s)  Cardiovascular Cardiovascular Symptoms Reported: No symptoms reported    Respiratory Respiratory Symptoms Reported: No symptoms reported    Endocrine Patient reports the following symptoms related to hypoglycemia or hyperglycemia : Increased urination (had frequent urination one day and had to monitor her diet) Is patient diabetic?: Yes Is patient checking blood sugars at home?: Yes Endocrine Conditions: Diabetes Endocrine Management Strategies: Diet modification, Medication therapy, Medical device  Gastrointestinal Gastrointestinal Symptoms Reported: Diarrhea, Constipation Gastrointestinal Conditions: Constipation, Diarrhea Gastrointestinal Management Strategies: Medication therapy, Diet modification Gastrointestinal Comment: She is modify and drink more water and take medication to help    Genitourinary Genitourinary Symptoms Reported: Incontinence Additional Genitourinary  Details: has had incontinence for about  three weeks.  She des Geologist, engineering Genitourinary Conditions: Incontinence Genitourinary Management Strategies: Incontinence garment/pad  Integumentary Integumentary Symptoms Reported: Other Other Integumentary Symptoms: irritation rash in the vaginal area that is itchy Skin Conditions: Itching Skin Management Strategies: Medication therapy  Musculoskeletal Musculoskelatal Symptoms Reviewed: Difficulty walking Musculoskeletal Conditions: Back pain, Joint pain Musculoskeletal Management Strategies: Medication therapy Falls in the past year?: Yes Number of falls in past year: 2 or more Was there an injury with Fall?: No Fall Risk Category Calculator: 2 Patient Fall Risk Level: Moderate Fall Risk Patient at Risk for Falls Due to: Impaired balance/gait Fall risk Follow up: Falls evaluation completed, Education provided, Falls prevention discussed  Psychosocial       Quality of Family Relationships: supportive, involved, stressful Do you feel physically threatened by others?: No      06/11/2024   11:14 AM  Depression screen PHQ 2/9  Decreased Interest 1  Down, Depressed, Hopeless 1  PHQ - 2 Score 2  Altered sleeping 3  Tired, decreased energy 3  Change in appetite 1  Feeling bad or failure about yourself  1  Trouble concentrating 3  Moving slowly or fidgety/restless 1  Suicidal thoughts 1  PHQ-9 Score 15    There were no vitals filed for this visit.  Medications Reviewed Today     Reviewed by Augustin Leber, RN (Registered Nurse) on 06/11/24 at 1123  Med List Status: <None>   Medication Order Taking? Sig Documenting Provider Last Dose Status Informant  acetaminophen  (TYLENOL ) 650 MG CR tablet [id number] Yes Take 1,300 mg by mouth every 8 (eight) hours as needed for pain. [provider]  Active Self  ARIPiprazole  (ABILIFY ) 10 MG tablet [id number] Yes Take 1 tablet (10 mg total) by mouth daily. Hisada, Reina,  MD  Active Self   atorvastatin  (LIPITOR) 20 MG tablet [id number] Yes Take 1 tablet (20 mg total) by mouth daily. Azell Boll, MD  Active   buPROPion  (WELLBUTRIN  XL) 150 MG 24 hr tablet [id number] Yes Take 1 tablet (150 mg total) by mouth daily. Take total of 450 mg daily (300 mg + 150 mg) Azell Boll, MD  Active   buPROPion  (WELLBUTRIN  XL) 300 MG 24 hr tablet [id number] Yes Take 1 tablet (300 mg total) by mouth daily. Take total of 450 mg daily, along with 150 mg daily Azell Boll, MD  Active   calcium  carbonate (TUMS) 500 MG chewable tablet [id number]  Chew 2 tablets (400 mg of elemental calcium  total) by mouth 3 (three) times daily. Oralee Billow, MD  Active   ciclopirox  (PENLAC ) 8 % solution [id number] Yes Apply topically at bedtime. Apply over nail and surrounding skin. Apply daily over previous coat. After seven (7) days, may remove with alcohol and continue cycle. Azell Boll, MD  Active   cyclobenzaprine  (FLEXERIL ) 10 MG tablet [id number] Yes Take 0.5 tablets (5 mg total) by mouth 2 (two) times daily as needed for muscle spasms. Candee Cha, MD  Active   diclofenac  Sodium (VOLTAREN ) 1 % GEL [id number] Yes Apply 2 g topically 4 (four) times daily. Blinda Burger, NP  Active Self  empagliflozin  (JARDIANCE ) 10 MG TABS tablet [id number] Yes Take 1 tablet (10 mg total) by mouth daily. Azell Boll, MD  Active   fluticasone  (FLONASE ) 50 MCG/ACT nasal spray [id number] Yes Place 2 sprays into both nostrils daily. Marceil Sensor, NP  Active Self  gabapentin  (NEURONTIN ) 300 MG capsule [id number] Yes Take 1 capsule (300 mg total) by mouth 3 (three) times daily. Take 2-3 capsules before bed Azell Boll, MD  Active   hydrocortisone  cream 0.5 % [id number] Yes Apply 1 application  topically 2 (two) times daily as needed for itching. [provider]  Active Self  levothyroxine  (SYNTHROID ) 100 MCG tablet [id number] Yes Take 1 tablet (100 mcg total) by mouth daily before breakfast. Azell Boll, MD  Active   loratadine  (CLARITIN ) 10 MG tablet [id number] Yes Take 1 tablet (10 mg total) by mouth daily. AS NEEDED Marveen Slick, MD  Active Self  metFORMIN  (GLUCOPHAGE -XR) 500 MG 24 hr tablet [id number] Yes Take 2 tablets (1,000 mg total) by mouth 2 (two) times daily. Azell Boll, MD  Active   Multiple Vitamin (MULTIVITAMIN) tablet [id number] Yes Take 1 tablet by mouth daily. [provider]  Active Self  tirzepatide  (MOUNJARO ) 2.5 MG/0.5ML Pen [id number] Yes Inject 2.5 mg into the skin once a week. Azell Boll, MD  Active   traZODone  (DESYREL ) 50 MG tablet [id number] Yes Take 1 tablet (50 mg total) by mouth at bedtime as needed for sleep. Hisada, Reina, MD  Active   venlafaxine  XR (EFFEXOR -XR) 150 MG 24 hr capsule [id number] Yes Take 1 capsule (150 mg total) by mouth daily. Total of 187.5 mg daily. Take along with 37.5 mg cap Brown, Carina M, MD  Active   venlafaxine  XR (EFFEXOR -XR) 37.5 MG 24 hr capsule [id number] Yes Take 1 capsule (37.5 mg total) by mouth daily. TAKE 1 CAPSULE BY MOUTH DAILY ALONG WITH THE 150 MG FOR TOTAL DAILY DOSE OF 187.5 MG Azell Boll, MD  Active             Recommendation:   PCP Follow-up  Follow Up Plan:   Telephone follow up  appointment date/time:  7/232/25  11 am  Augustin Leber RN, BSN, [facility]  [facility], [facility]   Care Coordinator Phone: [phone]

## 2024-06-22 ENCOUNTER — Ambulatory Visit: Attending: Vascular Surgery | Admitting: Vascular Surgery

## 2024-06-22 ENCOUNTER — Encounter: Payer: Self-pay | Admitting: Vascular Surgery

## 2024-06-22 VITALS — BP 126/86 | HR 71 | Temp 98.3°F | Resp 18 | Ht 64.0 in | Wt 237.7 lb

## 2024-06-22 DIAGNOSIS — M79605 Pain in left leg: Secondary | ICD-10-CM | POA: Diagnosis not present

## 2024-06-22 DIAGNOSIS — M79604 Pain in right leg: Secondary | ICD-10-CM

## 2024-06-22 NOTE — Progress Notes (Signed)
 [facility] [place]  ASSESSMENT / PLAN: 63 y.o. female with bilateral lower extremity pain and gait instability.  The patient symptoms are more typical of radiculopathy than peripheral arterial disease.  She has normal vascular physical exam and reassuring noninvasive testing.  I reassured the patient about these findings.  She should continue follow-up with her primary care physician.  She can follow-up with me as needed.  CHIEF COMPLAINT: Bilateral lower extremity pain  HISTORY OF PRESENT ILLNESS: Jenny [name] is a 63 y.o. female referred to clinic for evaluation of possible peripheral arterial disease.  The patient reports pain in her legs which she describes as shooting.  Patient also gestures to her right knee pointing at the joint line describing shooting pain in this joint.  She does not have symptoms typical of intermittent claudication, ischemic rest pain, or ischemic ulceration.  She reports the pain is most noticeable when she is laying down at night.  Past Medical History:  Diagnosis Date   Anemia    Anxiety    Arthritis    Cancer (HCC)    colon cancer    Complication of anesthesia    Hard to wake up   Depression    Diabetes mellitus, type 2 (HCC)    Enlarged thyroid     Fatty liver     Past Surgical History:  Procedure Laterality Date   ANTERIOR CERVICAL DECOMP/DISCECTOMY FUSION N/A 06/12/2023   Procedure: ANTERIOR CERVICAL DISCECTOMY AND FUSION, CERVICAL THREE-CERVICAL FOUR;  Surgeon: Louis Shove, MD;  Location: [facility] OR;  Service: Neurosurgery;  Laterality: N/A;  3C   BOWEL RESECTION     CERVICAL SPINE SURGERY  06/17/2012   C5-C7 ACDF   CESAREAN SECTION     PARTIAL HYSTERECTOMY     PORT-A-CATH REMOVAL Left 07/28/2015   Procedure: REMOVAL PORT-A-CATH;  Surgeon: Bernarda Ned, MD;  Location: [facility] ORS;  Service: General;  Laterality: Left;   PORTACATH PLACEMENT Left 12/22/2014   Procedure: INSERTION PORT-A-CATH LEFT SUBCLAVIAN;  Surgeon: Bernarda Ned, MD;  Location: [facility] ORS;  Service: General;  Laterality: Left;   THYROIDECTOMY N/A 01/08/2024   Procedure: TOTAL THYROIDECTOMY;  Surgeon: Eletha Boas, MD;  Location: [facility] ORS;  Service: General;  Laterality: N/A;   TONSILLECTOMY      Family History  Problem Relation Age of Onset   Cancer Brother    Depression Brother    Cancer Brother    Hypertension Mother    Colon cancer Neg Hx     Social History   Socioeconomic History   Marital status: Married    Spouse name: Franky   Number of children: 3   Years of education: Not on file   Highest education level: Bachelor's degree (e.g., BA, AB, BS)  Occupational History   Not on file  Tobacco Use   Smoking status: Never    Passive exposure: Never   Smokeless tobacco: Never  Vaping Use   Vaping status: Never Used  Substance and Sexual Activity   Alcohol use: No   Drug use: No   Sexual activity: Yes    Birth control/protection: Surgical  Other Topics Concern   Not on file  Social History Narrative   Lives with husband and 2 kids   Has FIVE grandkids     Youngest born 2021    Right handed   Caffeine: 4x a week   Social Drivers of Corporate investment banker Strain: Low Risk  (07/28/2023)   Overall Financial Resource Strain (CARDIA)  Difficulty of Paying Living Expenses: Not hard at all  Food Insecurity: No Food Insecurity (06/11/2024)   Hunger Vital Sign    Worried About Running Out of Food in the Last Year: Never true    Ran Out of Food in the Last Year: Never true  Transportation Needs: No Transportation Needs (06/11/2024)   PRAPARE - Administrator, Civil Service (Medical): No    Lack of Transportation (Non-Medical): No  Physical Activity: Inactive (11/05/2023)   Exercise Vital Sign    Days of Exercise per Week: 0 days    Minutes of Exercise per Session: 0 min  Stress: No Stress Concern Present (07/28/2023)   [employer] of Occupational Health - Occupational Stress Questionnaire    Feeling of  Stress : Only a little  Social Connections: Moderately Integrated (07/28/2023)   Social Connection and Isolation Panel    Frequency of Communication with Friends and Family: More than three times a week    Frequency of Social Gatherings with Friends and Family: Three times a week    Attends Religious Services: More than 4 times per year    Active Member of Clubs or Organizations: No    Attends Banker Meetings: Never    Marital Status: Married  Catering manager Violence: Not At Risk (06/11/2024)   Humiliation, Afraid, Rape, and Kick questionnaire    Fear of Current or Ex-Partner: No    Emotionally Abused: No    Physically Abused: No    Sexually Abused: No    Allergies  Allergen Reactions   Other Rash    *Derma Bond*   Attends Briefs Small Other (See Comments)   Shellfish Allergy Rash    Current Outpatient Medications  Medication Sig Dispense Refill   acetaminophen  (TYLENOL ) 650 MG CR tablet Take 1,300 mg by mouth every 8 (eight) hours as needed for pain.     ARIPiprazole  (ABILIFY ) 10 MG tablet Take 1 tablet (10 mg total) by mouth daily. 90 tablet 1   atorvastatin  (LIPITOR) 20 MG tablet Take 1 tablet (20 mg total) by mouth daily. 90 tablet 3   buPROPion  (WELLBUTRIN  XL) 150 MG 24 hr tablet Take 1 tablet (150 mg total) by mouth daily. Take total of 450 mg daily (300 mg + 150 mg) 90 tablet 1   buPROPion  (WELLBUTRIN  XL) 300 MG 24 hr tablet Take 1 tablet (300 mg total) by mouth daily. Take total of 450 mg daily, along with 150 mg daily 90 tablet 1   calcium  carbonate (TUMS) 500 MG chewable tablet Chew 2 tablets (400 mg of elemental calcium  total) by mouth 3 (three) times daily. 90 tablet 1   ciclopirox  (PENLAC ) 8 % solution Apply topically at bedtime. Apply over nail and surrounding skin. Apply daily over previous coat. After seven (7) days, may remove with alcohol and continue cycle. 6.6 mL 0   cyclobenzaprine  (FLEXERIL ) 10 MG tablet Take 0.5 tablets (5 mg total) by mouth 2  (two) times daily as needed for muscle spasms. 20 tablet 0   diclofenac  Sodium (VOLTAREN ) 1 % GEL Apply 2 g topically 4 (four) times daily. 150 g 4   empagliflozin  (JARDIANCE ) 10 MG TABS tablet Take 1 tablet (10 mg total) by mouth daily. 90 tablet 2   fluticasone  (FLONASE ) 50 MCG/ACT nasal spray Place 2 sprays into both nostrils daily. 16 g 2   gabapentin  (NEURONTIN ) 300 MG capsule Take 1 capsule (300 mg total) by mouth 3 (three) times daily. Take 2-3 capsules before bed 270  capsule 2   hydrocortisone  cream 0.5 % Apply 1 application  topically 2 (two) times daily as needed for itching.     levothyroxine  (SYNTHROID ) 100 MCG tablet Take 1 tablet (100 mcg total) by mouth daily before breakfast. 90 tablet 2   loratadine  (CLARITIN ) 10 MG tablet Take 1 tablet (10 mg total) by mouth daily. AS NEEDED 90 tablet 1   metFORMIN  (GLUCOPHAGE -XR) 500 MG 24 hr tablet Take 2 tablets (1,000 mg total) by mouth 2 (two) times daily. 360 tablet 2   Multiple Vitamin (MULTIVITAMIN) tablet Take 1 tablet by mouth daily.     tirzepatide  (MOUNJARO ) 2.5 MG/0.5ML Pen Inject 2.5 mg into the skin once a week. 2 mL 1   traZODone  (DESYREL ) 50 MG tablet Take 1 tablet (50 mg total) by mouth at bedtime as needed for sleep. 90 tablet 1   venlafaxine  XR (EFFEXOR -XR) 150 MG 24 hr capsule Take 1 capsule (150 mg total) by mouth daily. Total of 187.5 mg daily. Take along with 37.5 mg cap 90 capsule 1   venlafaxine  XR (EFFEXOR -XR) 37.5 MG 24 hr capsule Take 1 capsule (37.5 mg total) by mouth daily. TAKE 1 CAPSULE BY MOUTH DAILY ALONG WITH THE 150 MG FOR TOTAL DAILY DOSE OF 187.5 MG 90 capsule 1   No current facility-administered medications for this visit.    PHYSICAL EXAM Vitals:   06/22/24 1137  BP: 126/86  Pulse: 71  Resp: 18  Temp: 98.3 F (36.8 C)  TempSrc: Temporal  SpO2: 98%  Weight: 237 lb 11.2 oz (107.8 kg)  Height: 5' 4 (1.626 m)    Well-appearing woman in no distress Regular rate and rhythm Unlabored  breathing Palpable dorsalis pedis pulses bilaterally   PERTINENT LABORATORY AND RADIOLOGIC DATA  Most recent CBC    Latest Ref Rng & Units 01/02/2024    1:14 PM 06/15/2023    2:17 AM 06/14/2023    5:32 PM  CBC  WBC 4.0 - 10.5 K/uL 5.2  7.3  7.1   Hemoglobin 12.0 - 15.0 g/dL 87.1  88.5  86.7   Hematocrit 36.0 - 46.0 % 38.1  36.1  41.7   Platelets 150 - 400 K/uL 442  220  259      Most recent CMP    Latest Ref Rng & Units 05/14/2024   11:46 AM 02/23/2024   10:54 AM 01/09/2024    5:19 AM  CMP  Glucose 70 - 99 mg/dL 865  856    BUN 8 - 27 mg/dL 14  12    Creatinine 9.42 - 1.00 mg/dL 9.35  9.26    Sodium 865 - 144 mmol/L 142  143    Potassium 3.5 - 5.2 mmol/L 4.0  4.4    Chloride 96 - 106 mmol/L 105  102    CO2 20 - 29 mmol/L 21  25    Calcium  8.7 - 10.3 mg/dL 9.2  9.3  8.8   Total Protein 6.0 - 8.5 g/dL 7.1     Total Bilirubin 0.0 - 1.2 mg/dL 0.5     Alkaline Phos 44 - 121 IU/L 79     AST 0 - 40 IU/L 13     ALT 0 - 32 IU/L 14       Renal function CrCl cannot be calculated (Patient's most recent lab result is older than the maximum 21 days allowed.).  HbA1c, POC (controlled diabetic range) (%)  Date Value  05/14/2024 6.9    LDL Chol Calc (NIH)  Date Value  Ref Range Status  12/09/2023 48 0 - 99 mg/dL Final     +-------+-----------+-----------+------------+------------+  ABI/TBIToday's ABIToday's TBIPrevious ABIPrevious TBI  +-------+-----------+-----------+------------+------------+  Right 1.13       1.03                                 +-------+-----------+-----------+------------+------------+  Left  1.14       1.08                                 +-------+-----------+-----------+------------+------------+    Debby SAILOR. Magda, MD [facility] Phone Number: [phone] 06/22/2024 12:35 PM   Total time spent on preparing this encounter including chart review, data review, collecting history,  examining the patient, and coordinating care: 45 minutes  Portions of this report may have been transcribed using voice recognition software.  Every effort has been made to ensure accuracy; however, inadvertent computerized transcription errors may still be present.

## 2024-06-23 ENCOUNTER — Other Ambulatory Visit: Payer: Self-pay | Admitting: Family Medicine

## 2024-06-30 ENCOUNTER — Ambulatory Visit (HOSPITAL_COMMUNITY): Admitting: Psychiatry

## 2024-07-02 ENCOUNTER — Telehealth: Payer: Self-pay | Admitting: Family Medicine

## 2024-07-02 NOTE — Telephone Encounter (Signed)
 Mounjaor

## 2024-07-02 NOTE — Telephone Encounter (Signed)
 Patient in with husband today and has not heard about Mounjaro  approval--pharmacy team can you please check to see if this medication approved?  [name], MD  Family Medicine Teaching Service

## 2024-07-05 ENCOUNTER — Other Ambulatory Visit: Payer: Self-pay | Admitting: Psychiatry

## 2024-07-05 ENCOUNTER — Other Ambulatory Visit (HOSPITAL_COMMUNITY): Payer: Self-pay

## 2024-07-06 ENCOUNTER — Encounter: Payer: Self-pay | Admitting: Family Medicine

## 2024-07-13 ENCOUNTER — Other Ambulatory Visit: Payer: Self-pay

## 2024-07-13 NOTE — Patient Outreach (Signed)
 Complex Care Management   Visit Note  07/13/2024  Name:  Jenny [name] MRN: [id number] DOB: 02-18-1961  Situation: Referral received for Complex Care Management related to Diabetes with Complications I obtained verbal consent from Patient.  Visit completed with patient  on the phone  Background:   Past Medical History:  Diagnosis Date   Anemia    Anxiety    Arthritis    Cancer (HCC)    colon cancer    Complication of anesthesia    Hard to wake up   Depression    Diabetes mellitus, type 2 (HCC)    Enlarged thyroid     Fatty liver     Assessment: Patient Reported Symptoms:  Cognitive Cognitive Status: Able to follow simple commands, Alert and oriented to person, place, and time, Normal speech and language skills      Neurological Neurological Review of Symptoms: No symptoms reported    HEENT HEENT Symptoms Reported: No symptoms reported      Cardiovascular Cardiovascular Symptoms Reported: Swelling in legs or feet Does patient have uncontrolled Hypertension?: No Cardiovascular Comment: Sweloling in feet but will prop her feet up and it will go down  Respiratory Respiratory Symptoms Reported: No symptoms reported    Endocrine Is patient diabetic?: Yes Is patient checking blood sugars at home?: Yes List most recent blood sugar readings, include date and time of day: 115 this morning and highwas 165    Gastrointestinal Gastrointestinal Symptoms Reported: Diarrhea, Constipation, Incontinence Gastrointestinal Management Strategies: Medication therapy    Genitourinary Genitourinary Symptoms Reported: Urgency, Incontinence Genitourinary Management Strategies: Incontinence garment/pad  Integumentary Integumentary Symptoms Reported: Other, Itching Other Integumentary Symptoms: She has soem irritation in the vaginal area Skin Management Strategies: Medication therapy  Musculoskeletal Musculoskelatal Symptoms Reviewed: Difficulty walking, Joint pain Musculoskeletal Management  Strategies: Medical device Falls in the past year?: Yes Number of falls in past year: 1 or less Was there an injury with Fall?: No Fall Risk Category Calculator: 1 Patient Fall Risk Level: Low Fall Risk Patient at Risk for Falls Due to: Impaired balance/gait Fall risk Follow up: Falls evaluation completed, Education provided, Falls prevention discussed  Psychosocial       Quality of Family Relationships: supportive, involved Do you feel physically threatened by others?: No      07/13/2024   11:26 AM  Depression screen PHQ 2/9  Decreased Interest 1  Down, Depressed, Hopeless 2  PHQ - 2 Score 3  Altered sleeping 2  Tired, decreased energy 3  Change in appetite 1  Feeling bad or failure about yourself  1  Trouble concentrating 1  Moving slowly or fidgety/restless 1  Suicidal thoughts 1  PHQ-9 Score 13    There were no vitals filed for this visit.  Medications Reviewed Today     Reviewed by Weyman Corning, RN (Registered Nurse) on 07/13/24 at 1115  Med List Status: <None>   Medication Order Taking? Sig Documenting Provider Last Dose Status Informant  acetaminophen  (TYLENOL ) 650 MG CR tablet [id number] Yes Take 1,300 mg by mouth every 8 (eight) hours as needed for pain. [provider]  Active Self  ARIPiprazole  (ABILIFY ) 10 MG tablet [id number] Yes Take 1 tablet (10 mg total) by mouth daily. Hisada, Reina, MD  Active Self  atorvastatin  (LIPITOR) 20 MG tablet [id number] Yes Take 1 tablet (20 mg total) by mouth daily. Delores Suzann CHRISTELLA, MD  Active   buPROPion  (WELLBUTRIN  XL) 150 MG 24 hr tablet [id number] Yes Take 1 tablet (150 mg total) by mouth daily.  Take total of 450 mg daily (300 mg + 150 mg) Delores Suzann HERO, MD  Active   buPROPion  (WELLBUTRIN  XL) 300 MG 24 hr tablet [id number] Yes Take 1 tablet (300 mg total) by mouth daily. Take total of 450 mg daily, along with 150 mg daily Delores Suzann HERO, MD  Active   calcium  carbonate (TUMS) 500 MG chewable tablet [id number] Yes Chew 2  tablets (400 mg of elemental calcium  total) by mouth 3 (three) times daily. Eletha Boas, MD  Active   ciclopirox  (PENLAC ) 8 % solution [id number] Yes Apply topically at bedtime. Apply over nail and surrounding skin. Apply daily over previous coat. After seven (7) days, may remove with alcohol and continue cycle. Delores Suzann HERO, MD  Active   cyclobenzaprine  (FLEXERIL ) 10 MG tablet [id number] Yes Take 0.5 tablets (5 mg total) by mouth 2 (two) times daily as needed for muscle spasms. Donah Laymon PARAS, MD  Active   diclofenac  Sodium (VOLTAREN ) 1 % GEL [id number] Yes Apply 2 g topically 4 (four) times daily. Richad Jon HERO, NP  Active Self  empagliflozin  (JARDIANCE ) 10 MG TABS tablet [id number] Yes Take 1 tablet (10 mg total) by mouth daily. Delores Suzann HERO, MD  Active   fluticasone  (FLONASE ) 50 MCG/ACT nasal spray [id number] Yes Place 2 sprays into both nostrils daily. Lucila Delon BROCKS, NP  Active Self  gabapentin  (NEURONTIN ) 300 MG capsule [id number] Yes Take 1 capsule (300 mg total) by mouth 3 (three) times daily. Take 2-3 capsules before bed Delores Suzann HERO, MD  Active   hydrocortisone  cream 0.5 % [id number] Yes Apply 1 application  topically 2 (two) times daily as needed for itching. [provider]  Active Self  levothyroxine  (SYNTHROID ) 100 MCG tablet [id number] Yes Take 1 tablet (100 mcg total) by mouth daily before breakfast. Delores Suzann HERO, MD  Active   loratadine  (CLARITIN ) 10 MG tablet [id number] Yes Take 1 tablet (10 mg total) by mouth daily. AS NEEDED Jeanelle Layman CROME, MD  Active Self  metFORMIN  (GLUCOPHAGE -XR) 500 MG 24 hr tablet [id number] Yes TAKE 2 TABLETS BY MOUTH TWICE A DAY Delores Suzann HERO, MD  Active   Multiple Vitamin (MULTIVITAMIN) tablet [id number] Yes Take 1 tablet by mouth daily. [provider]  Active Self  tirzepatide  (MOUNJARO ) 2.5 MG/0.5ML Pen [id number] Yes Inject 2.5 mg into the skin once a week. Delores Suzann HERO, MD  Active   traZODone  (DESYREL ) 50 MG  tablet [id number] Yes Take 1 tablet (50 mg total) by mouth at bedtime as needed for sleep. Hisada, Reina, MD  Active   venlafaxine  XR (EFFEXOR -XR) 150 MG 24 hr capsule [id number] Yes Take 1 capsule (150 mg total) by mouth daily. Total of 187.5 mg daily. Take along with 37.5 mg cap Brown, Carina M, MD  Active   venlafaxine  XR (EFFEXOR -XR) 37.5 MG 24 hr capsule [id number] Yes Take 1 capsule (37.5 mg total) by mouth daily. TAKE 1 CAPSULE BY MOUTH DAILY ALONG WITH THE 150 MG FOR TOTAL DAILY DOSE OF 187.5 MG Delores Suzann HERO, MD  Active             Recommendation:   PCP Follow-up  Follow Up Plan:   Telephone follow up appointment with care management team member scheduled for:  08/16/24  1145 amwith [id number] RNCM   Wilbert Diver RN, BSN, [facility] [place]  [facility], [facility]    Care Coordinator Phone: [phone]

## 2024-07-13 NOTE — Patient Instructions (Signed)
 Visit Information  Thank you for taking time to visit with me today. Please don't hesitate to contact me if I can be of assistance to you before our next scheduled appointment.  Telephone follow up appointment with care management team member scheduled for:  08/16/24  1145 amwith Rosaline Finlay [facility]   Please call the care guide team at [phone] if you need to cancel, schedule, or reschedule an appointment.   Please call the Suicide and Crisis Lifeline: 988 call the [place]  National Suicide Prevention Lifeline: 2195302103 or TTY: 825-202-5035 TTY 307-442-0444) to talk to a trained counselor call 1-800-273-TALK (toll free, 24 hour hotline) call 911 if you are experiencing a Mental Health or Behavioral Health Crisis or need someone to talk to.  Wilbert Diver RN, BSN, [facility]   [facility], [facility]    Care Coordinator Phone: [phone]

## 2024-07-14 ENCOUNTER — Ambulatory Visit (INDEPENDENT_AMBULATORY_CARE_PROVIDER_SITE_OTHER): Admitting: Psychiatry

## 2024-07-14 DIAGNOSIS — F330 Major depressive disorder, recurrent, mild: Secondary | ICD-10-CM

## 2024-07-14 DIAGNOSIS — F4310 Post-traumatic stress disorder, unspecified: Secondary | ICD-10-CM | POA: Diagnosis not present
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
## 2024-07-21 ENCOUNTER — Other Ambulatory Visit: Payer: Self-pay | Admitting: Psychiatry

## 2024-07-21 DIAGNOSIS — F4310 Post-traumatic stress disorder, unspecified: Secondary | ICD-10-CM

## 2024-07-22 DIAGNOSIS — Z1231 Encounter for screening mammogram for malignant neoplasm of breast: Secondary | ICD-10-CM | POA: Diagnosis not present

## 2024-07-22 LAB — HM MAMMOGRAPHY

## 2024-07-27 ENCOUNTER — Ambulatory Visit (INDEPENDENT_AMBULATORY_CARE_PROVIDER_SITE_OTHER)

## 2024-07-27 VITALS — BP 154/98 | HR 69 | Ht 65.0 in | Wt 238.4 lb

## 2024-07-27 DIAGNOSIS — M542 Cervicalgia: Secondary | ICD-10-CM

## 2024-07-27 DIAGNOSIS — E119 Type 2 diabetes mellitus without complications: Secondary | ICD-10-CM

## 2024-07-27 DIAGNOSIS — Z0000 Encounter for general adult medical examination without abnormal findings: Secondary | ICD-10-CM

## 2024-07-27 DIAGNOSIS — R2681 Unsteadiness on feet: Secondary | ICD-10-CM

## 2024-07-27 DIAGNOSIS — R030 Elevated blood-pressure reading, without diagnosis of hypertension: Secondary | ICD-10-CM | POA: Diagnosis not present

## 2024-07-27 MED ORDER — GABAPENTIN 300 MG PO CAPS: 300.0000 mg | ORAL_CAPSULE | Freq: Three times a day (TID) | ORAL | 2 refills | Status: AC

## 2024-07-27 MED ORDER — CYCLOBENZAPRINE HCL 10 MG PO TABS
5.0000 mg | ORAL_TABLET | Freq: Two times a day (BID) | ORAL | 0 refills | Status: AC | PRN
Start: 2024-07-27 — End: ?

## 2024-07-27 NOTE — Assessment & Plan Note (Signed)
 Controlled, last A1c 05/14/2024 6.9 last ACR was 10 on 02/23/2024.  Was still taking Ozempic , and did not receive message about Mounjaro .  Counseling given regarding hydration and avoiding overeating.  No history of ophthalmology evaluation. -Stop Ozempic , -start Mounjaro  this Sunday. -Ophthalmology referral sent -Follow-up in 4 weeks

## 2024-07-27 NOTE — Patient Instructions (Addendum)
 Thank you for visiting the clinic today, it was good to see you!  Please always bring your medication bottles  In today's visit we discussed:  Neck pain: I have refilled your muscle relaxer, Flexeril  you can take this up to 3 times in a day as needed for the pain.  You can also take extra strength Tylenol  2 pills up to 4 times a day as needed for the pain.  We have very low concern that this is related to a brain bleed.  Please use caution when getting in and out of large vehicles.  Diabetes: The Mounjaro  order has been sent, and I sent you a route password reset for MyChart.  You will start on this dose for 4 weeks, and return to the clinic to assess for side effects and increase the dose.  Lump in throat: This was unlikely to be related to a mass or bleed.  If it returns and persists, if the skin turns red, your voice gets hoarse, or you start to lose your voice these are concerns to schedule an appointment and return.  You need your TDAP and Shingles vaccines, request them at the pharmacy.  Please follow-up in 1 month for diabetes  For any questions, please call the office at [phone] or send me a message in MyChart. Have a great day!  -Fairy Amy, MD  [facility] Family Medicine Resident, PGY-1

## 2024-07-27 NOTE — Progress Notes (Signed)
 SUBJECTIVE:   CHIEF COMPLAINT / HPI:   Fall: Slipped getting into truck Sunday morning. Hit head and torso on ground. Had headache over right forehead and R eye that resolved after 20 minutes. Back of her head and neck burning pain worse when turning her head. Feels better when applying pressure on the neck. 8/10 at baseline. Non radiating, but also has chronic back pain that does improve with Gabapentin . No difficulty ambulating. Reports floaters in R eye that started last week, no blurry vision, or loss of vision in eyes. No hip or knee pain with walking. Mild improvement with 500 mg of tylenol . Ice pack has helped some. Modest improvement from Sunday in pain because the headache has resolved. No family history of brain bleeds or aneurisms in family.  Lump in her throat that appeared yesterday evening, and resolved this morning. Reports it was irritating and itchy but no hoarseness, or soar throat, no fevers or chills, N/V. She was concerned it was related to the headaches.  Type 2 diabetes: Was on Ozempic , and approved for Mounjaro  but did not receive message because she got locked out of her MyChart.  Last dose of Ozempic  this last Sunday.  Denies any GI upset, nausea, vomiting, constipation, diarrhea, fever, chills, chest pain, or shortness of breath.  PERTINENT  PMH / PSH: Stage IIIb metastatic colon cancer, chemo induced peripheral neuropathy.  Thyroidectomy January 2025 with normal path.  OBJECTIVE:   BP (!) 154/98   Pulse 69   Ht 5' 5 (1.651 m)   Wt 238 lb 6.4 oz (108.1 kg)   SpO2 98%   BMI 39.67 kg/m    Neck: Nontender over vertebral prominences, nontender and nonreproducible with palpation of paraspinal muscles.  No step-off palpated.  No swelling or erythema. Cardiac: Regular rate and rhythm. Normal S1/S2. No murmurs, rubs, or gallops appreciated. Lungs: Clear bilaterally to ascultation.  Abdomen: Normoactive bowel sounds. No tenderness to deep or light palpation. No  rebound or guarding.    Psych: Pleasant and appropriate  Neuro: Eyes PERRLA bilaterally, no tongue deviation, no dysarthria, 5 out of 5 strength bilateral upper and lower extremities.  Grip strength adequate and equal bilaterally.  Romberg positive, sit and walk with some left leg favoring but otherwise normal.   ASSESSMENT/PLAN:   Assessment & Plan Neck pain, acute Stable, though pain is 8 out of 10 and worse with movement she does report that it is improving slightly, and is responsive to 500 mg of Tylenol  and ice.  No focal neurologic deficits, headache resolved after 20 minutes, low concern for intracranial hemorrhage.  Symptoms improve with neck pressure, given symptoms are improving and fall was 2 days ago will defer imaging -Flexeril  5 mg 3 times daily as needed -Gabapentin  300 mg 3 times daily was refilled, this will also help with pain. -PT referral sent Gait instability Positive Romberg, gait at baseline sit and walk was normal, given fall this Sunday and another about a year ago was concerned for increased falls in the future -PT referral sent Type 2 diabetes mellitus without complication, without long-term current use of insulin  (HCC) Controlled, last A1c 05/14/2024 6.9 last ACR was 10 on 02/23/2024.  Was still taking Ozempic , and did not receive message about Mounjaro .  Counseling given regarding hydration and avoiding overeating.  No history of ophthalmology evaluation. -Stop Ozempic , -start Mounjaro  this Sunday. -Ophthalmology referral sent -Follow-up in 4 weeks Elevated blood pressure reading 154/98, patient reports that she is having 8 out of 10  pain today, was 128/86 4 weeks ago.  Will reevaluate at 4-week follow-up Healthcare maintenance Printed to receive Tdap and shingles when picking up medications at pharmacy.     Fairy Amy, MD [facility]

## 2024-07-28 ENCOUNTER — Ambulatory Visit (HOSPITAL_COMMUNITY): Admitting: Psychiatry
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
## 2024-08-04 ENCOUNTER — Encounter: Payer: Self-pay | Admitting: Psychiatry

## 2024-08-04 ENCOUNTER — Telehealth: Admitting: Psychiatry

## 2024-08-04 DIAGNOSIS — F330 Major depressive disorder, recurrent, mild: Secondary | ICD-10-CM

## 2024-08-04 DIAGNOSIS — G4700 Insomnia, unspecified: Secondary | ICD-10-CM

## 2024-08-04 DIAGNOSIS — F4310 Post-traumatic stress disorder, unspecified: Secondary | ICD-10-CM

## 2024-08-04 NOTE — Patient Instructions (Signed)
 Continue venlafaxine  187.5  mg daily  Continue bupropion  450 mg (300 mg + 150 mg) daily Continue Abilify  10 mg daily  Continue trazodone  25-50 mg at night as needed for sleep Next appointment: 10/1 at 9 am

## 2024-08-06 ENCOUNTER — Telehealth: Payer: Self-pay | Admitting: Family Medicine

## 2024-08-06 NOTE — Telephone Encounter (Signed)
 Error

## 2024-08-12 NOTE — Progress Notes (Signed)
    SUBJECTIVE:   CHIEF COMPLAINT: diabetes HPI:   Jenny [name] is a 63 y.o.  with history notable for type 2 DM and HTN presenting for follow up.   Discussed the use of AI scribe software for clinical note transcription with the patient, who gave verbal consent to proceed.  History of Present Illness Jenny [name] is a 63 year old female with diabetes who presents with concerns about memory issues and gait instability.  Cognitive impairment - Difficulty with attention when multiple tasks occurring  - Impaired multitasking ability - Frequent forgetfulness regarding tasks - Difficulty focusing in environments with background noise - No recent worsening of symptoms since a recent fall - Has had ongoing since chemotherapy   Gait instability and motor weakness - Gait instability, particularly when walking in a straight line, has been occurring since chemotherapy, has seen PT for this - Left-sided weakness present since chemotherapy - Requires support from a shopping cart when ambulating due to instability and low back pain - Low back pain requiring support when walking - Pain worsens when lying down - Pain persists throughout the day - The patient denies anesthesia in the saddle area, bowel/bladder incontinence, fever.       PERTINENT  PMH / PSH/Family/Social History : chemotherapy induced neuropathy   OBJECTIVE:   BP 125/81   Pulse 93   Ht 5' 5 (1.651 m)   Wt 240 lb 9.6 oz (109.1 kg)   SpO2 97%   BMI 40.04 kg/m   Today's weight:  Last Weight  Most recent update: 08/13/2024 11:12 AM    Weight  109.1 kg (240 lb 9.6 oz)            Review of prior weights: [employer]   08/13/24 1112  Weight: 240 lb 9.6 oz (109.1 kg)    RRR  Lungs clear With starting gait in straight line has truncal ataxia (present for years) Also ataxia with finger to nose on L side  ASSESSMENT/PLAN:   Assessment & Plan Type 2 diabetes mellitus without complication, without  long-term current use of insulin  (HCC) A1C at goal On a statin  Normal UACR, not on a statin BP appropriate Increase Mounjaro  to 5 mg  Ataxic gait Previously referred back to spine surgery for this Has been ongoing for years Localizes to c-spine or brainstem on exam MRI of brain with and without contrast Consider L spine imaging if non-revealing May be related to Abilify  (although she reports this was present in 2016 (documented by PT) and Abilify  started 2020  Hoarseness Since thyroidectomy Referred to SLP  Metabolic dysfunction-associated fatty liver disease (MAFLD) Hepatic panel today  Memory change MOCA at follow up Imaging as above TSH, B12 today  Suspect more attention related, which I discussed with her     [name], MD  Family Medicine Teaching Service  [facility]

## 2024-08-13 ENCOUNTER — Encounter: Payer: Self-pay | Admitting: Family Medicine

## 2024-08-13 ENCOUNTER — Ambulatory Visit (INDEPENDENT_AMBULATORY_CARE_PROVIDER_SITE_OTHER): Admitting: Family Medicine

## 2024-08-13 VITALS — BP 125/81 | HR 93 | Ht 65.0 in | Wt 240.6 lb

## 2024-08-13 DIAGNOSIS — E119 Type 2 diabetes mellitus without complications: Secondary | ICD-10-CM | POA: Diagnosis not present

## 2024-08-13 DIAGNOSIS — R260 Ataxic gait: Secondary | ICD-10-CM

## 2024-08-13 DIAGNOSIS — R413 Other amnesia: Secondary | ICD-10-CM | POA: Diagnosis not present

## 2024-08-13 DIAGNOSIS — K760 Fatty (change of) liver, not elsewhere classified: Secondary | ICD-10-CM

## 2024-08-13 DIAGNOSIS — R490 Dysphonia: Secondary | ICD-10-CM

## 2024-08-13 LAB — POCT GLYCOSYLATED HEMOGLOBIN (HGB A1C): HbA1c, POC (controlled diabetic range): 6.9 % (ref 0.0–7.0)

## 2024-08-13 MED ORDER — MOUNJARO 5 MG/0.5ML ~~LOC~~ SOAJ: 5.0000 mg | SUBCUTANEOUS | 1 refills | Status: DC

## 2024-08-13 NOTE — Assessment & Plan Note (Signed)
 A1C at goal On a statin  Normal UACR, not on a statin BP appropriate Increase Mounjaro  to 5 mg

## 2024-08-13 NOTE — Patient Instructions (Signed)
 It was wonderful to see you today.  Please bring ALL of your medications with you to every visit.   Today we talked about:  - Once you use up all of your 2.5 mg Mounjaro  start 5 mg  - We will test your memory at your next visit  - You will be called about -Speech therapy - MRI brain   We will check blood work today  Please bring your food log to your next visit    Please follow up in 1 months   Thank you for choosing [facility].   Please call [phone] with any questions about today's appointment.  Please be sure to schedule follow up at the front  desk before you leave today.   [name], MD  Family Medicine

## 2024-08-14 LAB — HEPATIC FUNCTION PANEL
ALT: 17 IU/L (ref 0–32)
AST: 17 IU/L (ref 0–40)
Albumin: 4.5 g/dL (ref 3.9–4.9)
Alkaline Phosphatase: 83 IU/L (ref 44–121)
Bilirubin Total: 0.4 mg/dL (ref 0.0–1.2)
Bilirubin, Direct: 0.15 mg/dL (ref 0.00–0.40)
Total Protein: 6.9 g/dL (ref 6.0–8.5)

## 2024-08-14 LAB — TSH: TSH: 0.639 u[IU]/mL (ref 0.450–4.500)

## 2024-08-14 LAB — VITAMIN B12: Vitamin B-12: 500 pg/mL (ref 232–1245)

## 2024-08-15 ENCOUNTER — Ambulatory Visit: Payer: Self-pay | Admitting: Family Medicine

## 2024-08-16 ENCOUNTER — Other Ambulatory Visit: Payer: Self-pay

## 2024-08-16 ENCOUNTER — Ambulatory Visit (INDEPENDENT_AMBULATORY_CARE_PROVIDER_SITE_OTHER): Admitting: Psychiatry

## 2024-08-16 DIAGNOSIS — F4310 Post-traumatic stress disorder, unspecified: Secondary | ICD-10-CM

## 2024-08-16 DIAGNOSIS — F329 Major depressive disorder, single episode, unspecified: Secondary | ICD-10-CM

## 2024-08-16 DIAGNOSIS — F330 Major depressive disorder, recurrent, mild: Secondary | ICD-10-CM
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
## 2024-08-16 NOTE — Patient Outreach (Signed)
 Complex Care Management   Visit Note  08/16/2024  Name:  Jenny [name] MRN: [id number] DOB: March 03, 1961  Situation: Referral received for Complex Care Management related to Diabetes and dysphagia I obtained verbal consent from Patient.  Visit completed with Patient  on the phone  Background:   Past Medical History:  Diagnosis Date   Anemia    Anxiety    Arthritis    Cancer (HCC)    colon cancer    Complication of anesthesia    Hard to wake up   Depression    Diabetes mellitus, type 2 (HCC)    Enlarged thyroid     Fatty liver     Assessment: Patient Reported Symptoms:  Cognitive Cognitive Status: Able to follow simple commands, Alert and oriented to person, place, and time, Normal speech and language skills, Struggling with memory recall Cognitive/Intellectual Conditions Management [RPT]: None reported or documented in medical history or problem list      Neurological Neurological Review of Symptoms: Other: [place] Neurological Symptoms/Conditions [RPT]: Patient reports increased issues with short-term memory. She has discussed with PCP, who is going to perform further memory assessments at follow-up visit next month. Request for MRI has been sent, not scheduled yet per patient. Neurological Management Strategies: Routine screening, Coping strategies  HEENT HEENT Symptoms Reported: Other: HEENT Management Strategies: Routine screening    Cardiovascular Cardiovascular Symptoms Reported: Swelling in legs or feet (Swelling around her ankles, which she attributes to being on her feet more. Patient reports she has been elevating her legs) Does patient have uncontrolled Hypertension?: No Cardiovascular Management Strategies: Medication therapy  Respiratory Respiratory Symptoms Reported: No symptoms reported    Endocrine Endocrine Symptoms Reported: No symptoms reported Is patient diabetic?: Yes Is patient checking blood sugars at home?: Yes List most recent blood sugar readings,  include date and time of day: 126 this morning    Gastrointestinal Gastrointestinal Symptoms Reported: Constipation Additional Gastrointestinal Details: Patient reports her appetite is great. She reports issues with constipation. She uses Miralax , prune juice, and water to help regulate her bowels. Last BM 2 days ago. Gastrointestinal Management Strategies: Medication therapy, Diet modification    Genitourinary Genitourinary Symptoms Reported: Urgency, Incontinence Additional Genitourinary Details: Stress and urge incontinence Genitourinary Management Strategies: Incontinence garment/pad  Integumentary Integumentary Symptoms Reported: Other Other Integumentary Symptoms: Dry patches here and there on her fingers and foot. Irritation in the vaginal area Skin Management Strategies: Coping strategies (Ointments/creams)  Musculoskeletal Musculoskelatal Symptoms Reviewed: Difficulty walking, Joint pain Additional Musculoskeletal Details: Note PT referral was placed 07/27/24. Patient reports she has not heard from office to schedule appointment yet. Unable to tell where referral was sent. Musculoskeletal Management Strategies: Medical device Musculoskeletal Comment: Patient had a fall about 3 weeks ago getting into the car. Falls in the past year?: Yes Number of falls in past year: 1 or less Was there an injury with Fall?: No Fall Risk Category Calculator: 1 Patient Fall Risk Level: Low Fall Risk Patient at Risk for Falls Due to: History of fall(s), Impaired balance/gait Fall risk Follow up: Falls evaluation completed, Education provided, Falls prevention discussed  Psychosocial Psychosocial Symptoms Reported: Other Other Psychosocial Conditions: Patient reports she sees her therapist regularly (at least once a month). She feels that her depression is doing better.          08/16/2024    PHQ2-9 Depression Screening   Little interest or pleasure in doing things    Feeling down, depressed,  or hopeless    PHQ-2 - Total Score  Trouble falling or staying asleep, or sleeping too much    Feeling tired or having little energy    Poor appetite or overeating     Feeling bad about yourself - or that you are a failure or have let yourself or your family down    Trouble concentrating on things, such as reading the newspaper or watching television    Moving or speaking so slowly that other people could have noticed.  Or the opposite - being so fidgety or restless that you have been moving around a lot more than usual    Thoughts that you would be better off dead, or hurting yourself in some way    PHQ2-9 Total Score    If you checked off any problems, how difficult have these problems made it for you to do your work, take care of things at home, or get along with other people    Depression Interventions/Treatment      There were no vitals filed for this visit.  Medications Reviewed Today     Reviewed by Arno Rosaline SQUIBB, RN (Registered Nurse) on 08/16/24 at 1205  Med List Status: <None>   Medication Order Taking? Sig Documenting Provider Last Dose Status Informant  acetaminophen  (TYLENOL ) 650 MG CR tablet [id number]  Take 1,300 mg by mouth every 8 (eight) hours as needed for pain. [provider]  Active Self  ARIPiprazole  (ABILIFY ) 10 MG tablet [id number]  Take 1 tablet (10 mg total) by mouth daily. Vickey Mettle, MD  Expired 08/13/24 2359 Self  atorvastatin  (LIPITOR) 20 MG tablet [id number]  Take 1 tablet (20 mg total) by mouth daily. Delores Suzann HERO, MD  Active   buPROPion  (WELLBUTRIN  XL) 150 MG 24 hr tablet [id number]  Take 1 tablet (150 mg total) by mouth daily. Take total of 450 mg daily (300 mg + 150 mg) Delores Suzann HERO, MD  Active   buPROPion  (WELLBUTRIN  XL) 300 MG 24 hr tablet [id number]  Take 1 tablet (300 mg total) by mouth daily. Take total of 450 mg daily, along with 150 mg daily Delores Suzann HERO, MD  Active   calcium  carbonate (TUMS) 500 MG chewable tablet  [id number]  Chew 2 tablets (400 mg of elemental calcium  total) by mouth 3 (three) times daily. Eletha Boas, MD  Active   ciclopirox  (PENLAC ) 8 % solution [id number]  Apply topically at bedtime. Apply over nail and surrounding skin. Apply daily over previous coat. After seven (7) days, may remove with alcohol and continue cycle.  Patient not taking: Reported on 07/27/2024   Delores Suzann HERO, MD  Active   cyclobenzaprine  (FLEXERIL ) 10 MG tablet [id number]  Take 0.5 tablets (5 mg total) by mouth 2 (two) times daily as needed for muscle spasms. Lorrane Pac, MD  Active   diclofenac  Sodium (VOLTAREN ) 1 % GEL [id number]  Apply 2 g topically 4 (four) times daily. Richad Jon HERO, NP  Active Self  empagliflozin  (JARDIANCE ) 10 MG TABS tablet [id number]  Take 1 tablet (10 mg total) by mouth daily. Delores Suzann HERO, MD  Active   fluticasone  (FLONASE ) 50 MCG/ACT nasal spray [id number]  Place 2 sprays into both nostrils daily.  Patient not taking: Reported on 07/27/2024   Lucila Delon BROCKS, NP  Active Self  gabapentin  (NEURONTIN ) 300 MG capsule [id number]  Take 1 capsule (300 mg total) by mouth 3 (three) times daily. Take 2-3 capsules before bed Nygaard, Joseph, MD  Active   hydrocortisone  cream 0.5 % [id number]  Apply 1 application  topically  2 (two) times daily as needed for itching. [provider]  Active Self  levothyroxine  (SYNTHROID ) 100 MCG tablet [id number]  Take 1 tablet (100 mcg total) by mouth daily before breakfast. Delores Suzann HERO, MD  Active   loratadine  (CLARITIN ) 10 MG tablet [id number]  Take 1 tablet (10 mg total) by mouth daily. AS NEEDED Jeanelle Layman CROME, MD  Active Self  metFORMIN  (GLUCOPHAGE -XR) 500 MG 24 hr tablet [id number]  TAKE 2 TABLETS BY MOUTH TWICE A DAY Delores Suzann HERO, MD  Active   Multiple Vitamin (MULTIVITAMIN) tablet [id number]  Take 1 tablet by mouth daily. [provider]  Active Self  tirzepatide  (MOUNJARO ) 5 MG/0.5ML Pen [id number]  Inject 5 mg into the skin once  a week. Delores Suzann HERO, MD  Active   traZODone  (DESYREL ) 50 MG tablet [id number]  Take 1 tablet (50 mg total) by mouth at bedtime as needed for sleep. Hisada, Reina, MD  Active   venlafaxine  XR (EFFEXOR -XR) 150 MG 24 hr capsule [id number]  Take 1 capsule (150 mg total) by mouth daily. Total of 187.5 mg daily. Take along with 37.5 mg cap Brown, Carina M, MD  Active   venlafaxine  XR (EFFEXOR -XR) 37.5 MG 24 hr capsule [id number]  Take 1 capsule (37.5 mg total) by mouth daily. TAKE 1 CAPSULE BY MOUTH DAILY ALONG WITH THE 150 MG FOR TOTAL DAILY DOSE OF 187.5 MG Delores Suzann HERO, MD  Active             Recommendation:   PCP Follow-up Continue Current Plan of Care  Follow Up Plan:   Telephone follow up appointment date/time:  09/21/24 at 11:30 AM  Rosaline Finlay, RN MSN [place]  [facility] RN Care Manager Direct Dial: [phone]  Fax: [phone]

## 2024-08-16 NOTE — Patient Instructions (Signed)
 Visit Information  Thank you for taking time to visit with me today. Please don't hesitate to contact me if I can be of assistance to you before our next scheduled appointment.  Your next care management appointment is by telephone on 09/21/24 at 11:30 AM  Please call the care guide team at [phone] if you need to cancel, schedule, or reschedule an appointment.   Please call the Suicide and Crisis Lifeline: 988 call 1-800-273-TALK (toll free, 24 hour hotline) if you are experiencing a Mental Health or Behavioral Health Crisis or need someone to talk to.  Rosaline Finlay, RN MSN   [facility] RN Care Manager Direct Dial: [phone]  Fax: [phone]

## 2024-08-24 ENCOUNTER — Ambulatory Visit
Admission: RE | Admit: 2024-08-24 | Discharge: 2024-08-24 | Disposition: A | Discharge status: Home / Self Care | Source: Ambulatory Visit | Attending: Family Medicine | Admitting: Family Medicine

## 2024-08-24 DIAGNOSIS — R260 Ataxic gait: Secondary | ICD-10-CM | POA: Diagnosis not present

## 2024-08-24 MED ORDER — GADOBUTROL 1 MMOL/ML IV SOLN
10.0000 mL | Freq: Once | INTRAVENOUS | Status: AC | PRN
  Administered 2024-08-24: 10 mL via INTRAVENOUS

## 2024-08-26 ENCOUNTER — Encounter: Payer: Self-pay | Admitting: Family Medicine

## 2024-08-30 ENCOUNTER — Ambulatory Visit (HOSPITAL_COMMUNITY): Admitting: Psychiatry

## 2024-08-30 DIAGNOSIS — F329 Major depressive disorder, single episode, unspecified: Secondary | ICD-10-CM

## 2024-08-30 DIAGNOSIS — F4310 Post-traumatic stress disorder, unspecified: Secondary | ICD-10-CM
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
## 2024-09-08 ENCOUNTER — Ambulatory Visit

## 2024-09-08 DIAGNOSIS — M542 Cervicalgia: Secondary | ICD-10-CM | POA: Diagnosis not present

## 2024-09-08 DIAGNOSIS — R2689 Other abnormalities of gait and mobility: Secondary | ICD-10-CM

## 2024-09-08 DIAGNOSIS — R2681 Unsteadiness on feet: Secondary | ICD-10-CM

## 2024-09-08 DIAGNOSIS — R293 Abnormal posture: Secondary | ICD-10-CM | POA: Diagnosis not present

## 2024-09-08 DIAGNOSIS — M6281 Muscle weakness (generalized): Secondary | ICD-10-CM

## 2024-09-15 NOTE — Progress Notes (Signed)
 MILLEE DENISE                                          MRN: [id number]   09/15/2024   The [facility] Quality Team Specialist reviewed this patient medical record for the purposes of chart review for care gap closure. The following were reviewed: chart review for care gap closure-diabetic eye exam.    [facility] Quality Team

## 2024-09-15 NOTE — Progress Notes (Unsigned)
    SUBJECTIVE:   CHIEF COMPLAINT: medication check  HPI:   Jenny [name] is a 63 y.o.  with history notable for type 2 Diabetes presenting for follow up.   Discussed the use of AI scribe software for clinical note transcription with the patient, who gave verbal consent to proceed.  History of Present Illness   Cognitive impairment - Memory concerns with difficulty maintaining attention and missing words during memory tests - Symptoms observed by both patient and her husband - Recent significant psychosocial stressors, including loss of a nephew - Recent blood work including B12 and thyroid  levels is normal - has recent MRI without changes  Gait instability and unsteadiness - Unsteadiness and wobbliness rated as 8 out of 10 in terms of bother - Nearly fallen several times, especially when getting up quickly or turning - No assistive devices at home - Has been ongoing since chemotherapy (2015 diagnosis)  - Likely due to peripheral neuropathy - Since colonic surgery did have fecal incontinence which improved - Significant ataxia of trunk and LE - Chronic back pain with worsening symptoms over time  Diabetes - No side effects from mounjaro   - Practices intermittent fasting, eating between 12 PM and 9 PM - Has lost eight pounds, attributed to dietary regimen  Congestion - 24 hours of nasal congestion - Eating and drinking well - No dyspnea, chest pain or fevers  - Husband is also sick      PERTINENT  PMH / PSH/Family/Social History : type 2 DM, sigmoid colon cancer   OBJECTIVE:   BP (!) 125/90   Pulse 70   Ht 5' 5 (1.651 m)   Wt 232 lb 9.6 oz (105.5 kg)   SpO2 99%   BMI 38.71 kg/m   Today's weight:  Last Weight  Most recent update: 09/16/2024 11:19 AM    Weight  105.5 kg (232 lb 9.6 oz)            Review of prior weights: Filed Weights   09/16/24 1118  Weight: 232 lb 9.6 oz (105.5 kg)     Cardiac: Regular rate and rhythm. Normal S1/S2. No murmurs,  rubs, or gallops appreciated. Lungs: Clear bilaterally to ascultation.  Psych: Pleasant and appropriate   Mini Cog Clock draw appropriate  1/3 on three word recalll   ASSESSMENT/PLAN:   Assessment & Plan Memory change Question if due to medication or mood Referral to neurology largely for gait instability/ataxia  ? If abilify  contributing  Type 2 diabetes mellitus without complication, without long-term current use of insulin  (HCC) A1C in November Increase Mounjaro  to 7.5 mg Discussed side effects  Encounter for immunization Flu given  Chronic bilateral low back pain without sciatica Gait instability Ataxia Suspect complications of chemotherapy  Given bowel symptoms, will obtain MRI, some concern for spinal stenosis  Rollator ordered Discussed home safety  Congestion of throat COVID negative Supportive care     [name], MD  Family Medicine Teaching Service  [facility]

## 2024-09-16 ENCOUNTER — Ambulatory Visit: Payer: Self-pay | Admitting: Family Medicine

## 2024-09-16 ENCOUNTER — Encounter: Payer: Self-pay | Admitting: Family Medicine

## 2024-09-16 ENCOUNTER — Ambulatory Visit: Admitting: Family Medicine

## 2024-09-16 VITALS — BP 131/85 | HR 67 | Ht 65.0 in | Wt 232.6 lb

## 2024-09-16 DIAGNOSIS — R6889 Other general symptoms and signs: Secondary | ICD-10-CM | POA: Diagnosis not present

## 2024-09-16 DIAGNOSIS — Z23 Encounter for immunization: Secondary | ICD-10-CM

## 2024-09-16 DIAGNOSIS — R270 Ataxia, unspecified: Secondary | ICD-10-CM

## 2024-09-16 DIAGNOSIS — M545 Low back pain, unspecified: Secondary | ICD-10-CM | POA: Diagnosis not present

## 2024-09-16 DIAGNOSIS — E119 Type 2 diabetes mellitus without complications: Secondary | ICD-10-CM

## 2024-09-16 DIAGNOSIS — R413 Other amnesia: Secondary | ICD-10-CM

## 2024-09-16 DIAGNOSIS — G8929 Other chronic pain: Secondary | ICD-10-CM | POA: Diagnosis not present

## 2024-09-16 DIAGNOSIS — F3341 Major depressive disorder, recurrent, in partial remission: Secondary | ICD-10-CM

## 2024-09-16 DIAGNOSIS — R2681 Unsteadiness on feet: Secondary | ICD-10-CM | POA: Insufficient documentation

## 2024-09-16 LAB — POC SOFIA 2 FLU + SARS ANTIGEN FIA
Influenza A, POC: NEGATIVE
Influenza B, POC: NEGATIVE
SARS Coronavirus 2 Ag: NEGATIVE

## 2024-09-16 MED ORDER — MOUNJARO 7.5 MG/0.5ML ~~LOC~~ SOAJ: 7.5000 mg | SUBCUTANEOUS | 1 refills | Status: DC

## 2024-09-16 NOTE — Assessment & Plan Note (Signed)
 COVID negative Supportive care

## 2024-09-16 NOTE — Patient Instructions (Addendum)
 It was wonderful to see you today.  Please bring ALL of your medications with you to every visit.   Today we talked about:  Please send my condolences to your husband--I am sorry for your loss    You will be called about a walker  I have referred you to neurology to further evaluate your concern. If you have not received a phone call about this appointment within 3-4 weeks, please call our office back at [phone]. Margit Dimes coordinates our referrals and can assist you in this.   I recommend an MRI of your back to determine the cause of your gait changes  I recommend increasing our Mounjaro --I sent in the next dose   Please follow up in 1 months   Thank you for choosing [facility].   Please call [phone] with any questions about today's appointment.  Please be sure to schedule follow up at the front  desk before you leave today.   [name], MD  Family Medicine

## 2024-09-16 NOTE — Assessment & Plan Note (Signed)
 A1C in November Increase Mounjaro  to 7.5 mg Discussed side effects

## 2024-09-16 NOTE — Assessment & Plan Note (Signed)
 Suspect complications of chemotherapy  Given bowel symptoms, will obtain MRI, some concern for spinal stenosis  Rollator ordered Discussed home safety
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
## 2024-09-20 ENCOUNTER — Encounter: Payer: Self-pay | Admitting: Family Medicine

## 2024-09-21 ENCOUNTER — Other Ambulatory Visit: Payer: Self-pay

## 2024-09-21 ENCOUNTER — Encounter
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
## 2024-09-21 NOTE — Patient Instructions (Signed)
 Visit Information  Thank you for taking time to visit with me today. Please don't hesitate to contact me if I can be of assistance to you before our next scheduled appointment.  Your next care management appointment is no further scheduled appointments.    Closing From: Complex Care Management. Patient has met all care management goals. Care Management case will be closed. Patient has been provided contact information should new needs arise.   Please call the care guide team at [phone] if you need to cancel, schedule, or reschedule an appointment.   Please call the Suicide and Crisis Lifeline: 988 call 1-800-273-TALK (toll free, 24 hour hotline) if you are experiencing a Mental Health or Behavioral Health Crisis or need someone to talk to.  Rosaline Finlay, RN MSN [place]  VBCI Population Health RN Care Manager Direct Dial: [phone]  Fax: [phone]

## 2024-09-21 NOTE — Patient Outreach (Signed)
 Complex Care Management   Visit Note  09/21/2024  Name:  Jenny [name] MRN: [id number] DOB: [birth year]  Situation: Referral received for Complex Care Management related to dysphagia I obtained verbal consent from Patient.  Visit completed with Patient  on the phone  Background:   Past Medical History:  Diagnosis Date   Anemia    Anxiety    Arthritis    Cancer (HCC)    colon cancer    Complication of anesthesia    Hard to wake up   Depression    Diabetes mellitus, type 2 (HCC)    Enlarged thyroid     Fatty liver     Assessment: Patient Reported Symptoms:  Cognitive Cognitive Status: Able to follow simple commands, Alert and oriented to person, place, and time, Normal speech and language skills, Struggling with memory recall Cognitive/Intellectual Conditions Management [RPT]: None reported or documented in medical history or problem list      Neurological Neurological Review of Symptoms: Other: [place] Neurological Symptoms/Conditions [RPT]: Patient has had appointment with PCP regarding memory changes. A referral was placed for neurology. Patient is waiting to hear from their office to schedule an appointment    HEENT HEENT Symptoms Reported: Other: HEENT Management Strategies: Routine screening    Cardiovascular Cardiovascular Symptoms Reported: Swelling in legs or feet (Slight swelling in her ankles, at baseline and improved with elevation) Does patient have uncontrolled Hypertension?: No Cardiovascular Management Strategies: Medication therapy  Respiratory Respiratory Symptoms Reported: No symptoms reported Additional Respiratory Details: Patient reports she received flu vaccine at PCP appointment last week    Endocrine Endocrine Symptoms Reported: No symptoms reported Is patient diabetic?: Yes Is patient checking blood sugars at home?: Yes List most recent blood sugar readings, include date and time of day: 123 this morning Endocrine Comment: Note per chart review, PCP  increased dose of Mounjaro  at visit on 09/16/24. Patient reports she will be increasing after she finishes current pen per PCP instructions.  Gastrointestinal Gastrointestinal Symptoms Reported: Constipation Additional Gastrointestinal Details: Patient reports continued issues with constipation, managed with Miralax , prune juice, and water. Last BM yesterday Gastrointestinal Management Strategies: Diet modification, Medication therapy    Genitourinary Genitourinary Symptoms Reported: Urgency, Incontinence Additional Genitourinary Details: Stress and urge incontinence Genitourinary Management Strategies: Incontinence garment/pad  Integumentary Integumentary Symptoms Reported: Other Other Integumentary Symptoms: Patient reports irritation in the vaginal area has gotten a little better since previous [facility] visit. She continues to report dry patches on fingers and foot that she uses ointments/creams on Skin Management Strategies: Coping strategies (Ointments/creams)  Musculoskeletal Musculoskelatal Symptoms Reviewed: Difficulty walking, Joint pain Additional Musculoskeletal Details: Patient reports she has started working with PT. She is also doing exercises at home as well. Note per chart review that MRI of the back was ordered by PCP, scheduled 09/28/24 Musculoskeletal Management Strategies: Medical device Musculoskeletal Comment: Patient denies falls since previous [facility] visit Falls in the past year?: Yes Number of falls in past year: 1 or less Was there an injury with Fall?: No Fall Risk Category Calculator: 1 Patient Fall Risk Level: Low Fall Risk Patient at Risk for Falls Due to: History of fall(s), Impaired balance/gait Fall risk Follow up: Falls evaluation completed, Education provided, Falls prevention discussed  Psychosocial Psychosocial Symptoms Reported: Not assessed          09/21/2024    PHQ2-9 Depression Screening   Little interest or pleasure in doing things    Feeling down,  depressed, or hopeless    PHQ-2 - Total Score  Trouble falling or staying asleep, or sleeping too much    Feeling tired or having little energy    Poor appetite or overeating     Feeling bad about yourself - or that you are a failure or have let yourself or your family down    Trouble concentrating on things, such as reading the newspaper or watching television    Moving or speaking so slowly that other people could have noticed.  Or the opposite - being so fidgety or restless that you have been moving around a lot more than usual    Thoughts that you would be better off dead, or hurting yourself in some way    PHQ2-9 Total Score    If you checked off any problems, how difficult have these problems made it for you to do your work, take care of things at home, or get along with other people    Depression Interventions/Treatment      There were no vitals filed for this visit.  Medications Reviewed Today     Reviewed by Arno Rosaline SQUIBB, RN (Registered Nurse) on 09/21/24 at 1142  Med List Status: <None>   Medication Order Taking? Sig Documenting Provider Last Dose Status Informant  acetaminophen  (TYLENOL ) 650 MG CR tablet [id number]  Take 1,300 mg by mouth every 8 (eight) hours as needed for pain. [provider]  Active Self  ARIPiprazole  (ABILIFY ) 10 MG tablet [id number]  Take 1 tablet (10 mg total) by mouth daily. Vickey Mettle, MD  Expired 09/16/24 2359 Self  atorvastatin  (LIPITOR) 20 MG tablet [id number]  Take 1 tablet (20 mg total) by mouth daily. Delores Suzann HERO, MD  Active   buPROPion  (WELLBUTRIN  XL) 150 MG 24 hr tablet [id number]  Take 1 tablet (150 mg total) by mouth daily. Take total of 450 mg daily (300 mg + 150 mg) Delores Suzann HERO, MD  Active   buPROPion  (WELLBUTRIN  XL) 300 MG 24 hr tablet [id number]  Take 1 tablet (300 mg total) by mouth daily. Take total of 450 mg daily, along with 150 mg daily Delores Suzann HERO, MD  Active   calcium  carbonate (TUMS) 500 MG chewable  tablet [id number]  Chew 2 tablets (400 mg of elemental calcium  total) by mouth 3 (three) times daily. Eletha Boas, MD  Active   ciclopirox  (PENLAC ) 8 % solution [id number]  Apply topically at bedtime. Apply over nail and surrounding skin. Apply daily over previous coat. After seven (7) days, may remove with alcohol and continue cycle.  Patient not taking: Reported on 07/27/2024   Delores Suzann HERO, MD  Active   cyclobenzaprine  (FLEXERIL ) 10 MG tablet [id number]  Take 0.5 tablets (5 mg total) by mouth 2 (two) times daily as needed for muscle spasms. Lorrane Pac, MD  Active   diclofenac  Sodium (VOLTAREN ) 1 % GEL [id number]  Apply 2 g topically 4 (four) times daily. Richad Jon HERO, NP  Active Self  empagliflozin  (JARDIANCE ) 10 MG TABS tablet [id number]  Take 1 tablet (10 mg total) by mouth daily. Delores Suzann HERO, MD  Active   fluticasone  (FLONASE ) 50 MCG/ACT nasal spray [id number]  Place 2 sprays into both nostrils daily.  Patient not taking: Reported on 07/27/2024   Lucila Delon BROCKS, NP  Active Self  gabapentin  (NEURONTIN ) 300 MG capsule [id number]  Take 1 capsule (300 mg total) by mouth 3 (three) times daily. Take 2-3 capsules before bed Nygaard, Joseph, MD  Active   hydrocortisone  cream 0.5 % [id number]  Apply 1 application  topically  2 (two) times daily as needed for itching. [provider]  Active Self  levothyroxine  (SYNTHROID ) 100 MCG tablet [id number]  Take 1 tablet (100 mcg total) by mouth daily before breakfast. Delores Suzann HERO, MD  Active   loratadine  (CLARITIN ) 10 MG tablet [id number]  Take 1 tablet (10 mg total) by mouth daily. AS NEEDED Jeanelle Layman CROME, MD  Active Self  metFORMIN  (GLUCOPHAGE -XR) 500 MG 24 hr tablet [id number]  TAKE 2 TABLETS BY MOUTH TWICE A DAY Delores Suzann HERO, MD  Active   Multiple Vitamin (MULTIVITAMIN) tablet [id number]  Take 1 tablet by mouth daily. [provider]  Active Self  tirzepatide  (MOUNJARO ) 7.5 MG/0.5ML Pen [id number]  Inject 7.5 mg into  the skin once a week. Delores Suzann HERO, MD  Active   traZODone  (DESYREL ) 50 MG tablet [id number]  Take 1 tablet (50 mg total) by mouth at bedtime as needed for sleep. Hisada, Reina, MD  Active   venlafaxine  XR (EFFEXOR -XR) 150 MG 24 hr capsule [id number]  Take 1 capsule (150 mg total) by mouth daily. Total of 187.5 mg daily. Take along with 37.5 mg cap Brown, Carina M, MD  Active   venlafaxine  XR (EFFEXOR -XR) 37.5 MG 24 hr capsule [id number]  Take 1 capsule (37.5 mg total) by mouth daily. TAKE 1 CAPSULE BY MOUTH DAILY ALONG WITH THE 150 MG FOR TOTAL DAILY DOSE OF 187.5 MG Delores Suzann HERO, MD  Active             Recommendation:   Continue Current Plan of Care  Follow Up Plan:   Closing From:  Complex Care Management Patient has met all care management goals. Care Management case will be closed. Patient has been provided contact information should new needs arise.   Rosaline Finlay, RN MSN [place]  [facility] Population Health RN Care Manager Direct Dial: [phone]  Fax: [phone]

## 2024-09-22 ENCOUNTER — Ambulatory Visit

## 2024-09-22 ENCOUNTER — Telehealth: Admitting: Psychiatry

## 2024-09-22 ENCOUNTER — Encounter: Payer: Self-pay | Admitting: Psychiatry

## 2024-09-22 DIAGNOSIS — G4700 Insomnia, unspecified: Secondary | ICD-10-CM

## 2024-09-22 DIAGNOSIS — M6281 Muscle weakness (generalized): Secondary | ICD-10-CM | POA: Diagnosis not present

## 2024-09-22 DIAGNOSIS — F330 Major depressive disorder, recurrent, mild: Secondary | ICD-10-CM | POA: Diagnosis not present

## 2024-09-22 DIAGNOSIS — R2689 Other abnormalities of gait and mobility: Secondary | ICD-10-CM

## 2024-09-22 DIAGNOSIS — R293 Abnormal posture: Secondary | ICD-10-CM | POA: Diagnosis not present

## 2024-09-22 DIAGNOSIS — R2681 Unsteadiness on feet: Secondary | ICD-10-CM

## 2024-09-22 DIAGNOSIS — F4310 Post-traumatic stress disorder, unspecified: Secondary | ICD-10-CM

## 2024-09-22 DIAGNOSIS — M542 Cervicalgia: Secondary | ICD-10-CM

## 2024-09-22 MED ORDER — VENLAFAXINE HCL ER 150 MG PO CP24: 150.0000 mg | ORAL_CAPSULE | Freq: Every day | ORAL | 1 refills | Status: AC

## 2024-09-22 MED ORDER — VENLAFAXINE HCL ER 37.5 MG PO CP24: 37.5000 mg | ORAL_CAPSULE | Freq: Every day | ORAL | 1 refills | Status: AC

## 2024-09-22 MED ORDER — BUPROPION HCL ER (XL) 300 MG PO TB24: 300.0000 mg | ORAL_TABLET | Freq: Every day | ORAL | 1 refills | Status: AC

## 2024-09-22 MED ORDER — BUPROPION HCL ER (XL) 150 MG PO TB24: 150.0000 mg | ORAL_TABLET | Freq: Every day | ORAL | 1 refills | Status: AC

## 2024-09-22 MED ORDER — ARIPIPRAZOLE 10 MG PO TABS: 10.0000 mg | ORAL_TABLET | Freq: Every day | ORAL | 1 refills | Status: AC

## 2024-09-22 NOTE — Patient Instructions (Signed)
 Continue venlafaxine  187.5  mg daily  Continue bupropion  450 mg (300 mg + 150 mg) daily Continue Abilify  10 mg daily  Continue trazodone  25-50 mg at night as needed for sleep Next appointment: 12/5 at 10 30

## 2024-09-23 ENCOUNTER — Ambulatory Visit

## 2024-09-23 VITALS — Ht 65.0 in | Wt 232.0 lb

## 2024-09-23 DIAGNOSIS — Z0000 Encounter for general adult medical examination without abnormal findings: Secondary | ICD-10-CM | POA: Diagnosis not present

## 2024-09-23 NOTE — Progress Notes (Addendum)
 Subjective:   PATRISIA [name] is a 63 y.o. female who presents for Medicare Annual (Subsequent) preventive examination.  Visit Complete: Virtual I connected with  [name] on 09/23/24 by a audio enabled telemedicine application and verified that I am speaking with the correct person using two identifiers.  Patient Location: Home  Provider Location: Home Office  I discussed the limitations of evaluation and management by telemedicine. The patient expressed understanding and agreed to proceed.  Vital Signs: Because this visit was a virtual/telehealth visit, some criteria may be missing or patient reported. Any vitals not documented were not able to be obtained and vitals that have been documented are patient reported.   Cardiac Risk Factors include: diabetes mellitus;sedentary lifestyle     Objective:    Today's Vitals   09/23/24 1451  Weight: 232 lb (105.2 kg)  Height: 5' 5 (1.651 m)  PainSc: 7    Body mass index is 38.61 kg/m.     09/23/2024    3:00 PM 09/16/2024   11:20 AM 09/08/2024   11:18 AM 08/13/2024   11:13 AM 04/08/2024   11:24 AM 02/23/2024   10:36 AM 02/10/2024   12:25 PM  Advanced Directives  Does Patient Have a Medical Advance Directive? No No No No Yes No No  Would patient like information on creating a medical advance directive? No - Patient declined Yes (MAU/Ambulatory/Procedural Areas - Information given) Yes (MAU/Ambulatory/Procedural Areas - Information given) No - Patient declined  No - Patient declined No - Patient declined    Current Medications (verified) Outpatient Encounter Medications as of 09/23/2024  Medication Sig   acetaminophen  (TYLENOL ) 650 MG CR tablet Take 1,300 mg by mouth every 8 (eight) hours as needed for pain.   ARIPiprazole  (ABILIFY ) 10 MG tablet Take 1 tablet (10 mg total) by mouth daily.   atorvastatin  (LIPITOR) 20 MG tablet Take 1 tablet (20 mg total) by mouth daily.   calcium  carbonate (TUMS) 500 MG chewable tablet Chew 2  tablets (400 mg of elemental calcium  total) by mouth 3 (three) times daily.   cyclobenzaprine  (FLEXERIL ) 10 MG tablet Take 0.5 tablets (5 mg total) by mouth 2 (two) times daily as needed for muscle spasms.   diclofenac  Sodium (VOLTAREN ) 1 % GEL Apply 2 g topically 4 (four) times daily.   empagliflozin  (JARDIANCE ) 10 MG TABS tablet Take 1 tablet (10 mg total) by mouth daily.   gabapentin  (NEURONTIN ) 300 MG capsule Take 1 capsule (300 mg total) by mouth 3 (three) times daily. Take 2-3 capsules before bed   hydrocortisone  cream 0.5 % Apply 1 application  topically 2 (two) times daily as needed for itching.   levothyroxine  (SYNTHROID ) 100 MCG tablet Take 1 tablet (100 mcg total) by mouth daily before breakfast.   loratadine  (CLARITIN ) 10 MG tablet Take 1 tablet (10 mg total) by mouth daily. AS NEEDED   metFORMIN  (GLUCOPHAGE -XR) 500 MG 24 hr tablet TAKE 2 TABLETS BY MOUTH TWICE A DAY   Multiple Vitamin (MULTIVITAMIN) tablet Take 1 tablet by mouth daily.   tirzepatide  (MOUNJARO ) 7.5 MG/0.5ML Pen Inject 7.5 mg into the skin once a week.   traZODone  (DESYREL ) 50 MG tablet Take 1 tablet (50 mg total) by mouth at bedtime as needed for sleep.   [START ON 11/03/2024] buPROPion  (WELLBUTRIN  XL) 150 MG 24 hr tablet Take 1 tablet (150 mg total) by mouth daily. Take total of 450 mg daily (300 mg + 150 mg)   [START ON 11/03/2024] buPROPion  (WELLBUTRIN  XL) 300 MG  24 hr tablet Take 1 tablet (300 mg total) by mouth daily. Take total of 450 mg daily, along with 150 mg daily   ciclopirox  (PENLAC ) 8 % solution Apply topically at bedtime. Apply over nail and surrounding skin. Apply daily over previous coat. After seven (7) days, may remove with alcohol and continue cycle. (Patient not taking: Reported on 09/23/2024)   fluticasone  (FLONASE ) 50 MCG/ACT nasal spray Place 2 sprays into both nostrils daily. (Patient not taking: Reported on 09/23/2024)   [START ON 11/03/2024] venlafaxine  XR (EFFEXOR -XR) 150 MG 24 hr capsule Take 1  capsule (150 mg total) by mouth daily. Total of 187.5 mg daily. Take along with 37.5 mg cap   [START ON 11/03/2024] venlafaxine  XR (EFFEXOR -XR) 37.5 MG 24 hr capsule Take 1 capsule (37.5 mg total) by mouth daily. TAKE 1 CAPSULE BY MOUTH DAILY ALONG WITH THE 150 MG FOR TOTAL DAILY DOSE OF 187.5 MG   No facility-administered encounter medications on file as of 09/23/2024.    Allergies (verified) Other, Attends briefs small, and Shellfish allergy   History: Past Medical History:  Diagnosis Date   Anemia    Anxiety    Arthritis    Cancer (HCC)    colon cancer    Complication of anesthesia    Hard to wake up   Depression    Diabetes mellitus, type 2 (HCC)    Enlarged thyroid     Fatty liver    Past Surgical History:  Procedure Laterality Date   ANTERIOR CERVICAL DECOMP/DISCECTOMY FUSION N/A 06/12/2023   Procedure: ANTERIOR CERVICAL DISCECTOMY AND FUSION, CERVICAL THREE-CERVICAL FOUR;  Surgeon: Louis Shove, MD;  Location: [facility] OR;  Service: Neurosurgery;  Laterality: N/A;  3C   BOWEL RESECTION     CERVICAL SPINE SURGERY  06/17/2012   C5-C7 ACDF   CESAREAN SECTION     PARTIAL HYSTERECTOMY     PORT-A-CATH REMOVAL Left 07/28/2015   Procedure: REMOVAL PORT-A-CATH;  Surgeon: Bernarda Ned, MD;  Location: [facility];  Service: General;  Laterality: Left;   PORTACATH PLACEMENT Left 12/22/2014   Procedure: INSERTION PORT-A-CATH LEFT SUBCLAVIAN;  Surgeon: Bernarda Ned, MD;  Location: [facility];  Service: General;  Laterality: Left;   THYROIDECTOMY N/A 01/08/2024   Procedure: TOTAL THYROIDECTOMY;  Surgeon: Eletha Boas, MD;  Location: [facility];  Service: General;  Laterality: N/A;   TONSILLECTOMY     Family History  Problem Relation Age of Onset   Cancer Brother    Depression Brother    Cancer Brother    Hypertension Mother    Colon cancer Neg Hx    Social History   Socioeconomic History   Marital status: Married    Spouse name: Franky   Number of children: 3   Years of education: Not on file    Highest education level: Bachelor's degree (e.g., BA, AB, BS)  Occupational History   Not on file  Tobacco Use   Smoking status: Never    Passive exposure: Never   Smokeless tobacco: Never  Vaping Use   Vaping status: Never Used  Substance and Sexual Activity   Alcohol use: No   Drug use: No   Sexual activity: Yes    Birth control/protection: Surgical  Other Topics Concern   Not on file  Social History Narrative   Lives with husband and 2 kids   Has FIVE grandkids     Youngest born 2021    Right handed   Caffeine: 4x a week   Social Drivers of Dispensing optician  Resource Strain: Low Risk  (09/23/2024)   Overall Financial Resource Strain (CARDIA)    Difficulty of Paying Living Expenses: Not hard at all  Food Insecurity: No Food Insecurity (09/23/2024)   Hunger Vital Sign    Worried About Running Out of Food in the Last Year: Never true    Ran Out of Food in the Last Year: Never true  Transportation Needs: No Transportation Needs (09/23/2024)   PRAPARE - Administrator, Civil Service (Medical): No    Lack of Transportation (Non-Medical): No  Physical Activity: Insufficiently Active (09/23/2024)   Exercise Vital Sign    Days of Exercise per Week: 2 days    Minutes of Exercise per Session: 20 min  Stress: No Stress Concern Present (09/23/2024)   [employer] of Occupational Health - Occupational Stress Questionnaire    Feeling of Stress: Only a little  Social Connections: Moderately Integrated (09/23/2024)   Social Connection and Isolation Panel    Frequency of Communication with Friends and Family: More than three times a week    Frequency of Social Gatherings with Friends and Family: Three times a week    Attends Religious Services: More than 4 times per year    Active Member of Clubs or Organizations: No    Attends Banker Meetings: Never    Marital Status: Married    Tobacco Counseling Counseling given: Not Answered   Clinical  Intake:  Pre-visit preparation completed: Yes  Pain : 0-10 Pain Score: 7  Pain Type: Chronic pain Pain Location: Generalized Pain Orientation: Right, Left Pain Descriptors / Indicators: Constant Pain Onset: More than a month ago Pain Frequency: Constant Pain Relieving Factors: resting and bracing, medical creams and pain meds  Pain Relieving Factors: resting and bracing, medical creams and pain meds  BMI - recorded: 38.71 Nutritional Status: BMI > 30  Obese Nutritional Risks: None Diabetes: Yes CBG done?: No (124) Did pt. bring in CBG monitor from home?: No  How often do you need to have someone help you when you read instructions, pamphlets, or other written materials from your doctor or pharmacy?: 1 - Never What is the last grade level you completed in school?: 3 years college  Interpreter Needed?: No  Information entered by :: Shirleymae Hauth, cma   Activities of Daily Living    09/23/2024    2:54 PM 01/08/2024    3:50 PM  In your present state of health, do you have any difficulty performing the following activities:  Hearing? 0 0  Vision? 0 0  Difficulty concentrating or making decisions? 1 0  Comment remembering where she lays something down   Walking or climbing stairs? 1   Dressing or bathing? 0   Doing errands, shopping? 0 0  Preparing Food and eating ? N   Using the Toilet? N   In the past six months, have you accidently leaked urine? N   Do you have problems with loss of bowel control? N   Managing your Medications? N   Managing your Finances? N   Housekeeping or managing your Housekeeping? N     Patient Care Team: Delores Suzann HERO, MD as PCP - General (Family Medicine) Kristie Lamprey, MD as Consulting Physician (Gastroenterology) Debby Hila, MD as Consulting Physician (General Surgery) Lanny Callander, MD as Consulting Physician (Hematology) Vickey Mettle, MD as Referring Physician (Psychiatry) Louis Shove, MD as Consulting Physician  (Neurosurgery)  Indicate any recent Medical Services you may have received from other than [facility] providers in  the past year (date may be approximate).     Assessment:   This is a routine wellness examination for Landon.  Hearing/Vision screen Hearing Screening - Comments:: No issues Vision Screening - Comments:: No issues   Goals Addressed             This Visit's Progress    Patient Stated       Working on weight loss and increasing activity.       Depression Screen    09/23/2024    3:01 PM 09/16/2024   11:20 AM 08/13/2024   11:12 AM 07/27/2024   10:34 AM 07/13/2024   11:26 AM 06/11/2024   11:14 AM 05/14/2024   11:10 AM  PHQ 2/9 Scores  PHQ - 2 Score 2   2 3 2    PHQ- 9 Score 9   12 13 15    Exception Documentation  Patient refusal Patient refusal    Patient refusal    Fall Risk    09/23/2024    3:00 PM 09/21/2024   11:31 AM 09/16/2024   11:20 AM 08/16/2024   11:50 AM 08/13/2024   11:12 AM  Fall Risk   Falls in the past year? 1 1 0 1 0  Number falls in past yr: 0 0 0 0 0  Injury with Fall? 0 0 0 0 0  Risk for fall due to : History of fall(s);Impaired balance/gait History of fall(s);Impaired balance/gait  History of fall(s);Impaired balance/gait No Fall Risks  Follow up Falls evaluation completed;Education provided Falls evaluation completed;Education provided;Falls prevention discussed  Falls evaluation completed;Education provided;Falls prevention discussed Falls evaluation completed    MEDICARE RISK AT HOME: Medicare Risk at Home Any stairs in or around the home?: Yes If so, are there any without handrails?: No Home free of loose throw rugs in walkways, pet beds, electrical cords, etc?: No Adequate lighting in your home to reduce risk of falls?: Yes Life alert?: No Use of a cane, walker or w/c?: Yes Grab bars in the bathroom?: No Shower chair or bench in shower?: No Elevated toilet seat or a handicapped toilet?: No  TIMED UP AND GO:  Was the test performed?  No     Cognitive Function:        09/23/2024    3:03 PM 07/28/2023    5:03 PM  6CIT Screen  What Year? 0 points 0 points  What month? 0 points 0 points  What time? 0 points 0 points  Count back from 20 0 points 0 points  Months in reverse 0 points 0 points  Repeat phrase 2 points 0 points  Total Score 2 points 0 points    Immunizations Immunization History  Administered Date(s) Administered   Influenza Whole 09/19/2008   Influenza, Seasonal, Injecte, Preservative Fre 12/09/2023, 09/16/2024   Influenza,inj,Quad PF,6+ Mos 11/20/2014, 10/13/2015, 12/25/2016, 02/25/2018, 09/23/2018, 10/19/2019, 09/27/2020, 09/27/2021, 12/19/2022   PFIZER Comirnaty(Gray Top)Covid-19 Tri-Sucrose Vaccine 07/10/2021   PFIZER(Purple Top)SARS-COV-2 Vaccination 09/27/2020, 10/18/2020   PNEUMOCOCCAL CONJUGATE-20 05/14/2024   Pfizer Covid-19 Vaccine Bivalent Booster 71yrs & up 09/27/2021   Pfizer(Comirnaty)Fall Seasonal Vaccine 12 years and older 05/14/2024   Pneumococcal Polysaccharide-23 12/25/2016    TDAP status: Up to date  Flu Vaccine status: Due, Education has been provided regarding the importance of this vaccine. Advised may receive this vaccine at local pharmacy or Health Dept. Aware to provide a copy of the vaccination record if obtained from local pharmacy or Health Dept. Verbalized acceptance and understanding.  Pneumococcal vaccine status: Up to date  Covid-19  vaccine status: Information provided on how to obtain vaccines.   Qualifies for Shingles Vaccine? Yes   Zostavax completed No   Shingrix Completed?: No.    Education has been provided regarding the importance of this vaccine. Patient has been advised to call insurance company to determine out of pocket expense if they have not yet received this vaccine. Advised may also receive vaccine at local pharmacy or Health Dept. Verbalized acceptance and understanding.  Screening Tests Health Maintenance  Topic Date Due   DTaP/Tdap/Td (1 - Tdap)  Never done   Zoster Vaccines- Shingrix (1 of 2) Never done   Medicare Annual Wellness (AWV)  07/27/2024   OPHTHALMOLOGY EXAM  09/17/2024   COVID-19 Vaccine (6 - Pfizer risk 2024-25 season) 11/14/2024   HEMOGLOBIN A1C  02/13/2025   Diabetic kidney evaluation - Urine ACR  02/22/2025   Diabetic kidney evaluation - eGFR measurement  05/14/2025   FOOT EXAM  05/14/2025   Colonoscopy  09/13/2031   Pneumococcal Vaccine: 50+ Years  Completed   Influenza Vaccine  Completed   Hepatitis C Screening  Completed   HIV Screening  Completed   Hepatitis B Vaccines 19-59 Average Risk  Aged Out   HPV VACCINES  Aged Out   Meningococcal B Vaccine  Aged Out   Mammogram  Discontinued    Health Maintenance  Health Maintenance Due  Topic Date Due   DTaP/Tdap/Td (1 - Tdap) Never done   Zoster Vaccines- Shingrix (1 of 2) Never done   Medicare Annual Wellness (AWV)  07/27/2024   OPHTHALMOLOGY EXAM  09/17/2024    Colorectal cancer screening: Type of screening: Colonoscopy. Completed 09/12/2021. Repeat every 10 years  Mammogram status: Completed 07/22/2024. Repeat every year   Lung Cancer Screening: (Low Dose CT Chest recommended if Age 74-80 years, 20 pack-year currently smoking OR have quit w/in 15years.) does not qualify.   Lung Cancer Screening Referral: n/a  Additional Screening:  Hepatitis C Screening: does qualify; Completed 12/06/2015  Vision Screening: Recommended annual ophthalmology exams for early detection of glaucoma and other disorders of the eye. Is the patient up to date with their annual eye exam?  No -calling to schedule Who is the provider or what is the name of the office in which the patient attends annual eye exams? [facility] If pt is not established with a provider, would they like to be referred to a provider to establish care? No .   Dental Screening: Recommended annual dental exams for proper oral hygiene  Diabetic Foot Exam: Diabetic Foot Exam: Completed  05/14/2024  Community Resource Referral / Chronic Care Management: CRR required this visit?  No   CCM required this visit?  No     Plan:     I have personally reviewed and noted the following in the patient's chart:   Medical and social history Use of alcohol, tobacco or illicit drugs  Current medications and supplements including opioid prescriptions. Patient is not currently taking opioid prescriptions. Functional ability and status Nutritional status Physical activity Advanced directives List of other physicians Hospitalizations, surgeries, and ER visits in previous 12 months Vitals Screenings to include cognitive, depression, and falls Referrals and appointments  In addition, I have reviewed and discussed with patient certain preventive protocols, quality metrics, and best practice recommendations. A written personalized care plan for preventive services as well as general preventive health recommendations were provided to patient.     Arnette LOISE Hoots, CMA   09/23/2024   After Visit Summary: (Mail) Due to this  being a telephonic visit, the after visit summary with patients personalized plan was offered to patient via mail   Nurse Notes: Patient will call to schedule her eye exam. Due last month. She will obtain flu shots and shingle vaccines as well.

## 2024-09-23 NOTE — Patient Instructions (Signed)
 [name],  Thank you for taking the time for your Medicare Wellness Visit. I appreciate your continued commitment to your health goals. Please review the care plan we discussed, and feel free to reach out if I can assist you further.  Medicare recommends these wellness visits once per year to help you and your care team stay ahead of potential health issues. These visits are designed to focus on prevention, allowing your provider to concentrate on managing your acute and chronic conditions during your regular appointments.  Please note that Annual Wellness Visits do not include a physical exam. Some assessments may be limited, especially if the visit was conducted virtually. If needed, we may recommend a separate in-person follow-up with your provider.  Ongoing Care Seeing your primary care provider every 3 to 6 months helps us  monitor your health and provide consistent, personalized care.   Referrals If a referral was made during today's visit and you haven't received any updates within two weeks, please contact the referred provider directly to check on the status.  Recommended Screenings:  Health Maintenance  Topic Date Due   DTaP/Tdap/Td vaccine (1 - Tdap) Never done   Zoster (Shingles) Vaccine (1 of 2) Never done   Eye exam for diabetics  09/17/2024   COVID-19 Vaccine (6 - Pfizer risk 2024-25 season) 11/14/2024   Hemoglobin A1C  02/13/2025   Yearly kidney health urinalysis for diabetes  02/22/2025   Yearly kidney function blood test for diabetes  05/14/2025   Complete foot exam   05/14/2025   Medicare Annual Wellness Visit  09/23/2025   Colon Cancer Screening  09/13/2031   Pneumococcal Vaccine for age over 83  Completed   Flu Shot  Completed   Hepatitis C Screening  Completed   HIV Screening  Completed   Hepatitis B Vaccine  Aged Out   HPV Vaccine  Aged Out   Meningitis B Vaccine  Aged Out   Breast Cancer Screening  Discontinued       09/23/2024    3:00 PM  Advanced  Directives  Does Patient Have a Medical Advance Directive? No  Would patient like information on creating a medical advance directive? No - Patient declined   Advance Care Planning is important because it: Ensures you receive medical care that aligns with your values, goals, and preferences. Provides guidance to your family and loved ones, reducing the emotional burden of decision-making during critical moments.  Vision: Annual vision screenings are recommended for early detection of glaucoma, cataracts, and diabetic retinopathy. These exams can also reveal signs of chronic conditions such as diabetes and high blood pressure.  Dental: Annual dental screenings help detect early signs of oral cancer, gum disease, and other conditions linked to overall health, including heart disease and diabetes.  Please see the attached documents for additional preventive care recommendations.

## 2024-09-24 ENCOUNTER — Ambulatory Visit: Attending: Family Medicine | Admitting: Speech Pathology

## 2024-09-24 DIAGNOSIS — R490 Dysphonia: Secondary | ICD-10-CM | POA: Diagnosis not present

## 2024-09-24 DIAGNOSIS — R2689 Other abnormalities of gait and mobility: Secondary | ICD-10-CM | POA: Insufficient documentation

## 2024-09-24 DIAGNOSIS — M542 Cervicalgia: Secondary | ICD-10-CM | POA: Diagnosis not present

## 2024-09-24 DIAGNOSIS — R2681 Unsteadiness on feet: Secondary | ICD-10-CM | POA: Insufficient documentation

## 2024-09-24 DIAGNOSIS — M4712 Other spondylosis with myelopathy, cervical region: Secondary | ICD-10-CM | POA: Diagnosis not present

## 2024-09-24 DIAGNOSIS — M6281 Muscle weakness (generalized): Secondary | ICD-10-CM | POA: Insufficient documentation

## 2024-09-24 DIAGNOSIS — R293 Abnormal posture: Secondary | ICD-10-CM | POA: Diagnosis not present

## 2024-09-24 DIAGNOSIS — R278 Other lack of coordination: Secondary | ICD-10-CM | POA: Diagnosis not present

## 2024-09-24 DIAGNOSIS — M4722 Other spondylosis with radiculopathy, cervical region: Secondary | ICD-10-CM | POA: Insufficient documentation

## 2024-09-24 NOTE — Therapy (Signed)
 OUTPATIENT SPEECH LANGUAGE PATHOLOGY  VOICE EVALUATION   Patient Name: Jenny [name] MRN: [id number] DOB:1961-04-07, 63 y.o., female Today's Date: 09/24/2024  PCP: [name], MD REFERRING PROVIDER: [name], MD   End of Session - 09/24/24 1117     Visit Number 1    Number of Visits 1    SLP Start Time 1030    SLP Stop Time  1100    SLP Time Calculation (min) 30 min    Activity Tolerance Patient tolerated treatment well;Other (comment)   pt arrived late to session         Past Medical History:  Diagnosis Date   Anemia    Anxiety    Arthritis    Cancer (HCC)    colon cancer    Complication of anesthesia    Hard to wake up   Depression    Diabetes mellitus, type 2 (HCC)    Enlarged thyroid     Fatty liver    Past Surgical History:  Procedure Laterality Date   ANTERIOR CERVICAL DECOMP/DISCECTOMY FUSION N/A 06/12/2023   Procedure: ANTERIOR CERVICAL DISCECTOMY AND FUSION, CERVICAL THREE-CERVICAL FOUR;  Surgeon: Louis Shove, MD;  Location: [facility];  Service: Neurosurgery;  Laterality: N/A;  3C   BOWEL RESECTION     CERVICAL SPINE SURGERY  06/17/2012   C5-C7 ACDF   CESAREAN SECTION     PARTIAL HYSTERECTOMY     PORT-A-CATH REMOVAL Left 07/28/2015   Procedure: REMOVAL PORT-A-CATH;  Surgeon: Bernarda Ned, MD;  Location: [facility];  Service: General;  Laterality: Left;   PORTACATH PLACEMENT Left 12/22/2014   Procedure: INSERTION PORT-A-CATH LEFT SUBCLAVIAN;  Surgeon: Bernarda Ned, MD;  Location: [facility];  Service: General;  Laterality: Left;   THYROIDECTOMY N/A 01/08/2024   Procedure: TOTAL THYROIDECTOMY;  Surgeon: Eletha Boas, MD;  Location: [facility];  Service: General;  Laterality: N/A;   TONSILLECTOMY     Patient Active Problem List   Diagnosis Date Noted   Congestion of throat 09/16/2024   Gait instability 09/16/2024   Cervical spondylosis with myelopathy and radiculopathy 06/12/2023   Hypercholesteremia 09/27/2021   MDD (major depressive disorder), recurrent, in  partial remission 12/23/2019   PTSD (post-traumatic stress disorder) 12/23/2019   Adjustment disorder with mixed anxiety and depressed mood 02/11/2018   Chemotherapy-induced peripheral neuropathy 01/06/2016   Type 2 diabetes mellitus without complications (HCC) 12/07/2015   History of thyroidectomy 12/14/2014   Cancer of sigmoid colon metastatic to intra-abdominal lymph node (HCC) 11/14/2014   Obesity 05/29/2009    ONSET DATE:  01/08/2024: Date of referral 08/13/2024  REFERRING DIAG: R49.0 (ICD-10-CM) - Hoarseness   THERAPY DIAG:  Dysphonia  Rationale for Evaluation and Treatment Rehabilitation  SUBJECTIVE:   SUBJECTIVE STATEMENT: Pt pleasant, good historian Pt accompanied by: self  PERTINENT HISTORY and DIAGNOSTIC FINDINGS: Pt is a 63 year old female who presents with hoarseness following her total thyroidectomy on 01/08/2024 d/t  multinodular thyroid  goiter. Pt with history of dysphagia d/t ACDF and potential impact of goiter.   Modified Barium Swallow Study - 06/16/2023 Pt presents with a moderate pharyngoesophageal dysphagia c/b reduced pharyngeal stripping wave, minimal epiglottic deflection presumed 2/2 hypopharyngeal edema, incomplete laryngeal closure, reduced UES opening with suspected CP bar (see images below), and esophageal retention and backflow. These deficits resulted in sensed, trace penetration of thin liquid which pt was able to fully clear with spontaneous cough and reswallow. With nectar thick liquid, puree, and solid textures there was no penetration.   Modified Barium Swallow Study - 03/16/2020  Pt presents with adequate oropharyngeal abilities.   Pt with past medical history of type 2 diabetes mellitus, ataxic gait, MAFLD, PTSD and memory changes.    PAIN:  Are you having pain? No   FALLS: Has patient fallen in last 6 months? No,   LIVING ENVIRONMENT: Lives with: lives with their family and lives with their spouse Lives in: House/apartment  PLOF:  Independent  PATIENT GOALS    to improve her voice  OBJECTIVE:  COGNITION: Overall cognitive status: No family/caregiver present to determine baseline cognitive functioning: pending referral to neurologist for potential memory/attention deficits  SOCIAL HISTORY: Occupation: retired Publishing rights manager intake: optimal Caffeine/alcohol intake: minimal Daily voice use: excessive Environmental risks: Noisy environment and Stressful environment Occupational risks: Other: retired Runner, broadcasting/film/video Misuse: Speaks excessively, Excessively low pitch, Glottal fry, Strain, Tension, Speaks without adequate warm-up, Speaks without adequate breath support, and Speaks on residual capacity Phonotraumatic behaviors: Speaks in noise, Performs or speaks to large groups without amplification, Speaks at a great distance from listeners, Excessive voice use, and Excessive and/or habitual throat clearing  PERCEPTUAL VOICE ASSESSMENT: Voice quality: hoarse, breathy, harsh, rough, strained, low vocal intensity, diplophonia, vocal fatigue, and aphonic Vocal abuse: habitual throat clearing, excessive voice use, and habitual abnormal pitch Resonance: normal Respiratory function: clavicular breathing  OBJECTIVE VOICE ASSESSMENT: Sustained ah maximum phonation time: 3.8 seconds Sustained ah loudness average: 69 dB Average fundamental frequency during sustained "ah":118 Hz   (4.6 SD below average of  244 Hz +/- 27 for gender)  Oral reading (passage) loudness average: 69 dB Oral reading loudness range: 10 dB Conversational pitch average: 120 Hz Conversational loudness average: 69 dB S/z ratio: 1.5 (Suggestive of dysfunction >1.0) Voice quality: hoarse, breathy, harsh, rough, strained, low vocal intensity, diplophonia, vocal fatigue, and aphonic    ORAL MOTOR EXAMINATION Facial : WFL Lingual: WFL Velum: WFL Mandible: WFL Cough: WFL   PATIENT EDUCATION: Education details: results of this assessment, needs evaluation  by ENT prior to initiation of voice therapy Person educated: Patient Education method: Explanation Education comprehension: verbalized understanding   ASSESSMENT:  CLINICAL IMPRESSION: Patient is a 63 y.o. female who was seen today for a voice evaluation d/t moderate to severe dysphonia observed after total thyroidectomy. Pt has not been evaluated by ENT. Pt's dysphonia is c/b hoarse, breathy, strained, tense, glottal fry   OBJECTIVE IMPAIRMENTS include voice disorder. These impairments are limiting patient from effectively communicating at home and in community. Factors affecting potential to achieve goals and functional outcome are severity of impairments. Patient will benefit from skilled SLP services to address above impairments and improve overall function.  REHAB POTENTIAL: pending ENT evaluation  PLAN: Education provided on need for ENT evaluation prior to initiating voice therapy. All questions were answered to pt's satisfaction.   Cypher Paule B. Rubbie, M.S., CCC-SLP, Tree surgeon Certified Brain Injury Specialist [facility]  [facility] Office [phone] Ascom [phone] Fax [phone]

## 2024-09-26 ENCOUNTER — Other Ambulatory Visit: Payer: Self-pay | Admitting: Family Medicine

## 2024-09-26 DIAGNOSIS — R490 Dysphonia: Secondary | ICD-10-CM

## 2024-09-27 ENCOUNTER — Ambulatory Visit: Admitting: Speech Pathology
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
## 2024-09-28 ENCOUNTER — Ambulatory Visit

## 2024-09-28 ENCOUNTER — Ambulatory Visit
Admission: RE | Admit: 2024-09-28 | Discharge: 2024-09-28 | Disposition: A | Discharge status: Home / Self Care | Source: Ambulatory Visit | Attending: Family Medicine | Admitting: Family Medicine

## 2024-09-28 DIAGNOSIS — G8929 Other chronic pain: Secondary | ICD-10-CM

## 2024-09-28 DIAGNOSIS — R2681 Unsteadiness on feet: Secondary | ICD-10-CM

## 2024-09-28 DIAGNOSIS — M48061 Spinal stenosis, lumbar region without neurogenic claudication: Secondary | ICD-10-CM | POA: Diagnosis not present

## 2024-09-28 DIAGNOSIS — R293 Abnormal posture: Secondary | ICD-10-CM | POA: Diagnosis not present

## 2024-09-28 DIAGNOSIS — M6281 Muscle weakness (generalized): Secondary | ICD-10-CM | POA: Diagnosis not present

## 2024-09-28 DIAGNOSIS — R270 Ataxia, unspecified: Secondary | ICD-10-CM

## 2024-09-28 DIAGNOSIS — M47816 Spondylosis without myelopathy or radiculopathy, lumbar region: Secondary | ICD-10-CM | POA: Diagnosis not present

## 2024-09-28 DIAGNOSIS — M542 Cervicalgia: Secondary | ICD-10-CM

## 2024-09-28 DIAGNOSIS — M5126 Other intervertebral disc displacement, lumbar region: Secondary | ICD-10-CM | POA: Diagnosis not present

## 2024-09-28 DIAGNOSIS — R2689 Other abnormalities of gait and mobility: Secondary | ICD-10-CM
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
## 2024-09-30 ENCOUNTER — Telehealth: Payer: Self-pay

## 2024-09-30 ENCOUNTER — Ambulatory Visit

## 2024-09-30 ENCOUNTER — Ambulatory Visit (HOSPITAL_COMMUNITY): Admitting: Psychiatry

## 2024-09-30 DIAGNOSIS — F329 Major depressive disorder, single episode, unspecified: Secondary | ICD-10-CM

## 2024-09-30 DIAGNOSIS — R2689 Other abnormalities of gait and mobility: Secondary | ICD-10-CM | POA: Diagnosis not present

## 2024-09-30 DIAGNOSIS — R2681 Unsteadiness on feet: Secondary | ICD-10-CM

## 2024-09-30 DIAGNOSIS — F330 Major depressive disorder, recurrent, mild: Secondary | ICD-10-CM | POA: Diagnosis not present

## 2024-09-30 DIAGNOSIS — M542 Cervicalgia: Secondary | ICD-10-CM | POA: Diagnosis not present

## 2024-09-30 DIAGNOSIS — M4722 Other spondylosis with radiculopathy, cervical region: Secondary | ICD-10-CM

## 2024-09-30 DIAGNOSIS — M6281 Muscle weakness (generalized): Secondary | ICD-10-CM | POA: Diagnosis not present

## 2024-09-30 DIAGNOSIS — R293 Abnormal posture: Secondary | ICD-10-CM | POA: Diagnosis not present

## 2024-09-30 DIAGNOSIS — F4310 Post-traumatic stress disorder, unspecified: Secondary | ICD-10-CM | POA: Diagnosis not present
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
## 2024-09-30 NOTE — Telephone Encounter (Signed)
 Patient currently being seen at PT for cervical, shoulder, and generalized back pain with ataxia noted during functional tasks and ambulation. Patient also notes that she has been noticing deficits with ADLS, to include dressing and bathing. PT recommending OT referral. She is also currently being seen for speech therapy at [facility]. If you agree, please send OT referral to that clinic as we are likely to potentially transfer her there as well.   Susannah Daring, PT, DPT 09/30/24 8:49 AM

## 2024-09-30 NOTE — Telephone Encounter (Signed)
Referral placed.  Mandy Fitzwater, MD  Family Medicine Teaching Service   

## 2024-10-05 ENCOUNTER — Ambulatory Visit: Admitting: Physical Therapy

## 2024-10-05 DIAGNOSIS — M542 Cervicalgia: Secondary | ICD-10-CM | POA: Diagnosis not present

## 2024-10-05 DIAGNOSIS — M6281 Muscle weakness (generalized): Secondary | ICD-10-CM

## 2024-10-05 DIAGNOSIS — R2689 Other abnormalities of gait and mobility: Secondary | ICD-10-CM | POA: Diagnosis not present

## 2024-10-05 DIAGNOSIS — R490 Dysphonia: Secondary | ICD-10-CM | POA: Diagnosis not present

## 2024-10-05 DIAGNOSIS — R278 Other lack of coordination: Secondary | ICD-10-CM | POA: Diagnosis not present

## 2024-10-05 DIAGNOSIS — M4722 Other spondylosis with radiculopathy, cervical region: Secondary | ICD-10-CM

## 2024-10-05 DIAGNOSIS — M4712 Other spondylosis with myelopathy, cervical region: Secondary | ICD-10-CM | POA: Diagnosis not present

## 2024-10-05 DIAGNOSIS — R293 Abnormal posture: Secondary | ICD-10-CM | POA: Diagnosis not present

## 2024-10-05 DIAGNOSIS — R2681 Unsteadiness on feet: Secondary | ICD-10-CM | POA: Diagnosis not present
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
## 2024-10-06 ENCOUNTER — Encounter

## 2024-10-07 ENCOUNTER — Encounter: Payer: Self-pay | Admitting: Family Medicine

## 2024-10-07 ENCOUNTER — Ambulatory Visit

## 2024-10-07 DIAGNOSIS — M4722 Other spondylosis with radiculopathy, cervical region: Secondary | ICD-10-CM

## 2024-10-07 DIAGNOSIS — R490 Dysphonia: Secondary | ICD-10-CM | POA: Diagnosis not present

## 2024-10-07 DIAGNOSIS — R2681 Unsteadiness on feet: Secondary | ICD-10-CM | POA: Diagnosis not present

## 2024-10-07 DIAGNOSIS — M6281 Muscle weakness (generalized): Secondary | ICD-10-CM | POA: Diagnosis not present

## 2024-10-07 DIAGNOSIS — M542 Cervicalgia: Secondary | ICD-10-CM

## 2024-10-07 DIAGNOSIS — R2689 Other abnormalities of gait and mobility: Secondary | ICD-10-CM | POA: Diagnosis not present

## 2024-10-07 DIAGNOSIS — R293 Abnormal posture: Secondary | ICD-10-CM | POA: Diagnosis not present

## 2024-10-07 DIAGNOSIS — M4712 Other spondylosis with myelopathy, cervical region: Secondary | ICD-10-CM | POA: Diagnosis not present

## 2024-10-07 DIAGNOSIS — R278 Other lack of coordination: Secondary | ICD-10-CM | POA: Diagnosis not present
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
## 2024-10-08 ENCOUNTER — Encounter

## 2024-10-11 ENCOUNTER — Telehealth: Payer: Self-pay

## 2024-10-11 NOTE — Telephone Encounter (Signed)
 Patient Jenny [name] on nurse line returning call to Dr. Delores.   Returned call to patient. Verified name and DOB. Patient reports that message indicated to return call regarding results.   Read patient mychart message per Dr. Delores.   Scheduled patient for follow up with PCP on 11/7.  Chiquita JAYSON English, RN

## 2024-10-12 ENCOUNTER — Ambulatory Visit: Admitting: Occupational Therapy

## 2024-10-12 ENCOUNTER — Ambulatory Visit

## 2024-10-12 DIAGNOSIS — R490 Dysphonia: Secondary | ICD-10-CM | POA: Diagnosis not present

## 2024-10-12 DIAGNOSIS — R293 Abnormal posture: Secondary | ICD-10-CM | POA: Diagnosis not present

## 2024-10-12 DIAGNOSIS — R278 Other lack of coordination: Secondary | ICD-10-CM

## 2024-10-12 DIAGNOSIS — M6281 Muscle weakness (generalized): Secondary | ICD-10-CM

## 2024-10-12 DIAGNOSIS — M542 Cervicalgia: Secondary | ICD-10-CM

## 2024-10-12 DIAGNOSIS — M4712 Other spondylosis with myelopathy, cervical region: Secondary | ICD-10-CM

## 2024-10-12 DIAGNOSIS — R2689 Other abnormalities of gait and mobility: Secondary | ICD-10-CM | POA: Diagnosis not present

## 2024-10-12 DIAGNOSIS — R2681 Unsteadiness on feet: Secondary | ICD-10-CM

## 2024-10-12 DIAGNOSIS — M4722 Other spondylosis with radiculopathy, cervical region: Secondary | ICD-10-CM | POA: Diagnosis not present

## 2024-10-12 NOTE — Telephone Encounter (Signed)
 Attempted to call patient. Reached voicemail, left generic voicemail to call back.  Please identify good times to call patient if she calls back [name], MD  [facility] Medicine Teaching Service
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
## 2024-10-12 NOTE — Telephone Encounter (Signed)
 Called and discussed. Patient previously saw Dr. Louis. Never informed him of her gait changes. Recommended following up with Dr. Louis to see if he would recommend additional imaging.  She has ENT scheduled.  [name], MD  Family Medicine Teaching Service
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
## 2024-10-12 NOTE — Telephone Encounter (Signed)
 Patient returns call to nurse line.   She reports she will be available for the rest of the day today.   She reports she will keep her phone on her and reports she is sorry we have been playing phone tag.  Will forward to PCP.
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
## 2024-10-14 ENCOUNTER — Ambulatory Visit: Admitting: Occupational Therapy

## 2024-10-14 ENCOUNTER — Ambulatory Visit (HOSPITAL_COMMUNITY): Admitting: Psychiatry

## 2024-10-14 DIAGNOSIS — M542 Cervicalgia: Secondary | ICD-10-CM | POA: Diagnosis not present

## 2024-10-14 DIAGNOSIS — F329 Major depressive disorder, single episode, unspecified: Secondary | ICD-10-CM | POA: Diagnosis not present

## 2024-10-14 DIAGNOSIS — F330 Major depressive disorder, recurrent, mild: Secondary | ICD-10-CM

## 2024-10-14 DIAGNOSIS — R490 Dysphonia: Secondary | ICD-10-CM | POA: Diagnosis not present

## 2024-10-14 DIAGNOSIS — M4712 Other spondylosis with myelopathy, cervical region: Secondary | ICD-10-CM | POA: Diagnosis not present

## 2024-10-14 DIAGNOSIS — R293 Abnormal posture: Secondary | ICD-10-CM | POA: Diagnosis not present

## 2024-10-14 DIAGNOSIS — F4312 Post-traumatic stress disorder, chronic: Secondary | ICD-10-CM | POA: Diagnosis not present

## 2024-10-14 DIAGNOSIS — M6281 Muscle weakness (generalized): Secondary | ICD-10-CM | POA: Diagnosis not present

## 2024-10-14 DIAGNOSIS — R2681 Unsteadiness on feet: Secondary | ICD-10-CM | POA: Diagnosis not present

## 2024-10-14 DIAGNOSIS — F4310 Post-traumatic stress disorder, unspecified: Secondary | ICD-10-CM | POA: Diagnosis not present

## 2024-10-14 DIAGNOSIS — R2689 Other abnormalities of gait and mobility: Secondary | ICD-10-CM | POA: Diagnosis not present

## 2024-10-14 DIAGNOSIS — M4722 Other spondylosis with radiculopathy, cervical region: Secondary | ICD-10-CM | POA: Diagnosis not present

## 2024-10-14 DIAGNOSIS — R278 Other lack of coordination: Secondary | ICD-10-CM | POA: Diagnosis not present
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
## 2024-10-18 ENCOUNTER — Ambulatory Visit: Admitting: Occupational Therapy

## 2024-10-18 ENCOUNTER — Ambulatory Visit: Admitting: Physical Therapy

## 2024-10-18 DIAGNOSIS — R278 Other lack of coordination: Secondary | ICD-10-CM

## 2024-10-18 DIAGNOSIS — M4722 Other spondylosis with radiculopathy, cervical region: Secondary | ICD-10-CM

## 2024-10-18 DIAGNOSIS — R490 Dysphonia: Secondary | ICD-10-CM | POA: Diagnosis not present

## 2024-10-18 DIAGNOSIS — M6281 Muscle weakness (generalized): Secondary | ICD-10-CM

## 2024-10-18 DIAGNOSIS — M542 Cervicalgia: Secondary | ICD-10-CM

## 2024-10-18 DIAGNOSIS — R2689 Other abnormalities of gait and mobility: Secondary | ICD-10-CM | POA: Diagnosis not present

## 2024-10-18 DIAGNOSIS — R2681 Unsteadiness on feet: Secondary | ICD-10-CM | POA: Diagnosis not present

## 2024-10-18 DIAGNOSIS — M4712 Other spondylosis with myelopathy, cervical region: Secondary | ICD-10-CM | POA: Diagnosis not present

## 2024-10-18 DIAGNOSIS — R293 Abnormal posture: Secondary | ICD-10-CM | POA: Diagnosis not present
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
## 2024-10-20 ENCOUNTER — Ambulatory Visit: Admitting: Physical Therapy

## 2024-10-20 ENCOUNTER — Ambulatory Visit: Admitting: Occupational Therapy

## 2024-10-20 DIAGNOSIS — R2681 Unsteadiness on feet: Secondary | ICD-10-CM | POA: Diagnosis not present

## 2024-10-20 DIAGNOSIS — M542 Cervicalgia: Secondary | ICD-10-CM | POA: Diagnosis not present

## 2024-10-20 DIAGNOSIS — R293 Abnormal posture: Secondary | ICD-10-CM | POA: Diagnosis not present

## 2024-10-20 DIAGNOSIS — M4712 Other spondylosis with myelopathy, cervical region: Secondary | ICD-10-CM | POA: Diagnosis not present

## 2024-10-20 DIAGNOSIS — M4722 Other spondylosis with radiculopathy, cervical region: Secondary | ICD-10-CM | POA: Diagnosis not present

## 2024-10-20 DIAGNOSIS — R2689 Other abnormalities of gait and mobility: Secondary | ICD-10-CM | POA: Diagnosis not present

## 2024-10-20 DIAGNOSIS — M6281 Muscle weakness (generalized): Secondary | ICD-10-CM

## 2024-10-20 DIAGNOSIS — R490 Dysphonia: Secondary | ICD-10-CM | POA: Diagnosis not present

## 2024-10-20 DIAGNOSIS — R278 Other lack of coordination: Secondary | ICD-10-CM | POA: Diagnosis not present

## 2024-10-20 DIAGNOSIS — K219 Gastro-esophageal reflux disease without esophagitis: Secondary | ICD-10-CM | POA: Diagnosis not present

## 2024-10-20 DIAGNOSIS — J383 Other diseases of vocal cords: Secondary | ICD-10-CM | POA: Diagnosis not present
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
## 2024-10-25 ENCOUNTER — Ambulatory Visit: Attending: Family Medicine

## 2024-10-25 ENCOUNTER — Ambulatory Visit: Admitting: Occupational Therapy

## 2024-10-25 DIAGNOSIS — R293 Abnormal posture: Secondary | ICD-10-CM | POA: Insufficient documentation

## 2024-10-25 DIAGNOSIS — R2689 Other abnormalities of gait and mobility: Secondary | ICD-10-CM | POA: Diagnosis not present

## 2024-10-25 DIAGNOSIS — M4722 Other spondylosis with radiculopathy, cervical region: Secondary | ICD-10-CM | POA: Insufficient documentation

## 2024-10-25 DIAGNOSIS — M6281 Muscle weakness (generalized): Secondary | ICD-10-CM

## 2024-10-25 DIAGNOSIS — R2681 Unsteadiness on feet: Secondary | ICD-10-CM | POA: Insufficient documentation

## 2024-10-25 DIAGNOSIS — R490 Dysphonia: Secondary | ICD-10-CM | POA: Diagnosis not present

## 2024-10-25 DIAGNOSIS — R278 Other lack of coordination: Secondary | ICD-10-CM | POA: Diagnosis not present

## 2024-10-25 DIAGNOSIS — M542 Cervicalgia: Secondary | ICD-10-CM | POA: Diagnosis not present

## 2024-10-25 DIAGNOSIS — M4712 Other spondylosis with myelopathy, cervical region: Secondary | ICD-10-CM | POA: Diagnosis not present
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
## 2024-10-27 ENCOUNTER — Ambulatory Visit

## 2024-10-27 ENCOUNTER — Ambulatory Visit: Admitting: Physical Therapy

## 2024-10-27 DIAGNOSIS — R278 Other lack of coordination: Secondary | ICD-10-CM

## 2024-10-27 DIAGNOSIS — M6281 Muscle weakness (generalized): Secondary | ICD-10-CM

## 2024-10-27 DIAGNOSIS — R2681 Unsteadiness on feet: Secondary | ICD-10-CM

## 2024-10-27 DIAGNOSIS — M542 Cervicalgia: Secondary | ICD-10-CM

## 2024-10-27 DIAGNOSIS — M4712 Other spondylosis with myelopathy, cervical region: Secondary | ICD-10-CM | POA: Diagnosis not present

## 2024-10-27 DIAGNOSIS — R2689 Other abnormalities of gait and mobility: Secondary | ICD-10-CM | POA: Diagnosis not present

## 2024-10-27 DIAGNOSIS — R490 Dysphonia: Secondary | ICD-10-CM | POA: Diagnosis not present

## 2024-10-27 DIAGNOSIS — R293 Abnormal posture: Secondary | ICD-10-CM

## 2024-10-27 DIAGNOSIS — M4722 Other spondylosis with radiculopathy, cervical region: Secondary | ICD-10-CM | POA: Diagnosis not present
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
## 2024-10-28 NOTE — Progress Notes (Unsigned)
    SUBJECTIVE:   CHIEF COMPLAINT: follow up  HPI:   Jenny [name] is a 63 y.o.  with history notable for type 2 DM and history of colon cancer presenting for follow up.   Discussed the use of AI scribe software for clinical note transcription with the patient, who gave verbal consent to proceed.  History of Present Illness   Vocal cord dysfunction and throat symptoms - Has had hoarseness since thyroid  surgery  - Vocal cords are intact with swelling in the throat muscles. - Diagnosed with acid reflux and currently taking medication for management. - Vocal strain leads to fatigue and difficulty speaking, impacting daily activities.  Diabetes mellitus management - Diabetes management is stable with a recent HbA1c of 6.9%. - Currently taking multiple medications for diabetes, including Jardiance , metformin , and recently missed a dose of Mounjaro . - Tolerating 7.5 mg but desires increased weight loss   Genitourinary symptoms - Vaginal dryness and itching, when she scratches a lot will have a small amount of blood - History of hysterectomy years ago . - Cocoa butter provides partial symptomatic relief.  Gait instability - Continues to have gait instability - Denies falls - Has wide based gait which she reports she has learned as adaptation     PERTINENT  PMH / PSH/Family/Social History : chemotherapy induced neuropathy  Type 2 DM Thyroidectomy with voice changes-referred to ENT   OBJECTIVE:   BP 127/88   Pulse 64   Ht 5' 5 (1.651 m)   Wt 231 lb 3.2 oz (104.9 kg)   SpO2 99%   BMI 38.47 kg/m   Today's weight:  Last Weight  Most recent update: 10/29/2024  9:46 AM    Weight  104.9 kg (231 lb 3.2 oz)            Review of prior weights: [employer]   10/29/24 0946  Weight: 231 lb 3.2 oz (104.9 kg)    RRR Lungs clear bilaterally No edema Has truncal ataxia with wide based gait when trying to walk strait, when observed walking to room this was not present   ASSESSMENT/PLAN:   Assessment & Plan Type 2 diabetes mellitus without complication, without long-term current use of insulin  (HCC) A1C at goal Increase Mounjaro  to 10 mg  On a statin MDD (major depressive disorder), recurrent, in partial remission Doing well  Gait instability ? If due to chemotherapy or  medication or prior C-spine surgery She is to call Neurosurgery  Has Neurology referral in place Rollator ordered given risk of falls  Encounter for immunization COVID given  Laryngopharyngeal reflux Muscle tension dysphonia Seen by ENT  Started on PPI  Vulvar pruritus Discussed exam today, husband here, will schedule for 2 week follo wup     [name], MD  Family Medicine Teaching Service  [facility]

## 2024-10-29 ENCOUNTER — Encounter: Payer: Self-pay | Admitting: Family Medicine

## 2024-10-29 ENCOUNTER — Ambulatory Visit (HOSPITAL_COMMUNITY): Admitting: Psychiatry

## 2024-10-29 ENCOUNTER — Telehealth: Payer: Self-pay | Admitting: Family Medicine

## 2024-10-29 ENCOUNTER — Ambulatory Visit (INDEPENDENT_AMBULATORY_CARE_PROVIDER_SITE_OTHER): Admitting: Family Medicine

## 2024-10-29 VITALS — BP 127/88 | HR 64 | Ht 65.0 in | Wt 231.2 lb

## 2024-10-29 DIAGNOSIS — R490 Dysphonia: Secondary | ICD-10-CM | POA: Diagnosis not present

## 2024-10-29 DIAGNOSIS — F3341 Major depressive disorder, recurrent, in partial remission: Secondary | ICD-10-CM | POA: Diagnosis not present

## 2024-10-29 DIAGNOSIS — L292 Pruritus vulvae: Secondary | ICD-10-CM | POA: Diagnosis not present

## 2024-10-29 DIAGNOSIS — E119 Type 2 diabetes mellitus without complications: Secondary | ICD-10-CM

## 2024-10-29 DIAGNOSIS — Z23 Encounter for immunization: Secondary | ICD-10-CM

## 2024-10-29 DIAGNOSIS — K219 Gastro-esophageal reflux disease without esophagitis: Secondary | ICD-10-CM | POA: Diagnosis not present

## 2024-10-29 DIAGNOSIS — R2681 Unsteadiness on feet: Secondary | ICD-10-CM

## 2024-10-29 LAB — POCT GLYCOSYLATED HEMOGLOBIN (HGB A1C): HbA1c, POC (controlled diabetic range): 6.9 % (ref 0.0–7.0)

## 2024-10-29 MED ORDER — OMEPRAZOLE 20 MG PO CPDR
20.0000 mg | DELAYED_RELEASE_CAPSULE | Freq: Every day | ORAL | Status: AC
Start: 2024-10-29 — End: ?

## 2024-10-29 MED ORDER — TIRZEPATIDE 10 MG/0.5ML ~~LOC~~ SOAJ: 10.0000 mg | SUBCUTANEOUS | 1 refills | Status: DC

## 2024-10-29 NOTE — Assessment & Plan Note (Signed)
 Doing well

## 2024-10-29 NOTE — Assessment & Plan Note (Signed)
?   If due to chemotherapy or  medication or prior C-spine surgery She is to call Neurosurgery  Has Neurology referral in place Rollator ordered given risk of falls

## 2024-10-29 NOTE — Telephone Encounter (Signed)
Patient has need for DME. I have ordered  rollator . I am routing note for [facility] RN Pool.   Westley Chandler, MD

## 2024-10-29 NOTE — Assessment & Plan Note (Signed)
 A1C at goal Increase Mounjaro  to 10 mg  On a statin

## 2024-10-29 NOTE — Patient Instructions (Signed)
 It was wonderful to see you today.  Please bring ALL of your medications with you to every visit.   Today we talked about:  You are doing great with your health  Continue your medications  I increased your  Mounjaro  to 10 mg  Start this today   Please follow up in 2-3 weeks about vaginal irritation   Thank you for choosing [facility].   Please call [phone] with any questions about today's appointment.  Please be sure to schedule follow up at the front  desk before you leave today.   [name], MD  Family Medicine

## 2024-10-29 NOTE — Telephone Encounter (Signed)
 Community message sent to [employer]. Will await response.   Veronda Prude, RN

## 2024-11-01 ENCOUNTER — Ambulatory Visit: Admitting: Physical Therapy

## 2024-11-01 ENCOUNTER — Ambulatory Visit: Admitting: Occupational Therapy

## 2024-11-01 DIAGNOSIS — M4712 Other spondylosis with myelopathy, cervical region: Secondary | ICD-10-CM | POA: Diagnosis not present

## 2024-11-01 DIAGNOSIS — R278 Other lack of coordination: Secondary | ICD-10-CM

## 2024-11-01 DIAGNOSIS — M542 Cervicalgia: Secondary | ICD-10-CM | POA: Diagnosis not present

## 2024-11-01 DIAGNOSIS — R2689 Other abnormalities of gait and mobility: Secondary | ICD-10-CM | POA: Diagnosis not present

## 2024-11-01 DIAGNOSIS — R293 Abnormal posture: Secondary | ICD-10-CM | POA: Diagnosis not present

## 2024-11-01 DIAGNOSIS — R490 Dysphonia: Secondary | ICD-10-CM | POA: Diagnosis not present

## 2024-11-01 DIAGNOSIS — M6281 Muscle weakness (generalized): Secondary | ICD-10-CM

## 2024-11-01 DIAGNOSIS — R2681 Unsteadiness on feet: Secondary | ICD-10-CM | POA: Diagnosis not present

## 2024-11-01 DIAGNOSIS — M4722 Other spondylosis with radiculopathy, cervical region: Secondary | ICD-10-CM | POA: Diagnosis not present
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
## 2024-11-01 NOTE — Telephone Encounter (Signed)
 See below message from [employer].   Viktoria Ephraim Alfredo Chiquita JAYSON, RN; Joylene Cain; Tucker, Dolanda; Sheree, Merilee This has been taken care of and I reached out to patient for delivery today.   Chiquita JAYSON Alfredo, RN
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
## 2024-11-02 DIAGNOSIS — R1310 Dysphagia, unspecified: Secondary | ICD-10-CM | POA: Diagnosis not present

## 2024-11-03 ENCOUNTER — Ambulatory Visit: Admitting: Occupational Therapy

## 2024-11-03 ENCOUNTER — Ambulatory Visit: Admitting: Physical Therapy

## 2024-11-08 ENCOUNTER — Ambulatory Visit: Admitting: Physical Therapy

## 2024-11-08 ENCOUNTER — Ambulatory Visit: Admitting: Occupational Therapy

## 2024-11-08 DIAGNOSIS — M6281 Muscle weakness (generalized): Secondary | ICD-10-CM | POA: Diagnosis not present

## 2024-11-08 DIAGNOSIS — R490 Dysphonia: Secondary | ICD-10-CM | POA: Diagnosis not present

## 2024-11-08 DIAGNOSIS — R2681 Unsteadiness on feet: Secondary | ICD-10-CM

## 2024-11-08 DIAGNOSIS — R2689 Other abnormalities of gait and mobility: Secondary | ICD-10-CM

## 2024-11-08 DIAGNOSIS — M4712 Other spondylosis with myelopathy, cervical region: Secondary | ICD-10-CM | POA: Diagnosis not present

## 2024-11-08 DIAGNOSIS — R278 Other lack of coordination: Secondary | ICD-10-CM | POA: Diagnosis not present

## 2024-11-08 DIAGNOSIS — M4722 Other spondylosis with radiculopathy, cervical region: Secondary | ICD-10-CM | POA: Diagnosis not present

## 2024-11-08 DIAGNOSIS — R293 Abnormal posture: Secondary | ICD-10-CM | POA: Diagnosis not present

## 2024-11-08 DIAGNOSIS — M542 Cervicalgia: Secondary | ICD-10-CM
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
## 2024-11-10 ENCOUNTER — Ambulatory Visit: Admitting: Occupational Therapy

## 2024-11-10 ENCOUNTER — Encounter: Payer: Self-pay | Admitting: Physical Therapy

## 2024-11-10 ENCOUNTER — Ambulatory Visit: Admitting: Physical Therapy

## 2024-11-10 DIAGNOSIS — R2689 Other abnormalities of gait and mobility: Secondary | ICD-10-CM

## 2024-11-10 DIAGNOSIS — M6281 Muscle weakness (generalized): Secondary | ICD-10-CM | POA: Diagnosis not present

## 2024-11-10 DIAGNOSIS — M542 Cervicalgia: Secondary | ICD-10-CM | POA: Diagnosis not present

## 2024-11-10 DIAGNOSIS — R278 Other lack of coordination: Secondary | ICD-10-CM

## 2024-11-10 DIAGNOSIS — R2681 Unsteadiness on feet: Secondary | ICD-10-CM

## 2024-11-10 DIAGNOSIS — R293 Abnormal posture: Secondary | ICD-10-CM

## 2024-11-10 DIAGNOSIS — M4722 Other spondylosis with radiculopathy, cervical region: Secondary | ICD-10-CM | POA: Diagnosis not present

## 2024-11-10 DIAGNOSIS — R490 Dysphonia: Secondary | ICD-10-CM | POA: Diagnosis not present

## 2024-11-10 DIAGNOSIS — M4712 Other spondylosis with myelopathy, cervical region: Secondary | ICD-10-CM | POA: Diagnosis not present
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
## 2024-11-12 ENCOUNTER — Ambulatory Visit (INDEPENDENT_AMBULATORY_CARE_PROVIDER_SITE_OTHER): Admitting: Psychiatry

## 2024-11-12 DIAGNOSIS — F4312 Post-traumatic stress disorder, chronic: Secondary | ICD-10-CM | POA: Diagnosis not present

## 2024-11-12 DIAGNOSIS — F339 Major depressive disorder, recurrent, unspecified: Secondary | ICD-10-CM

## 2024-11-12 DIAGNOSIS — F4310 Post-traumatic stress disorder, unspecified: Secondary | ICD-10-CM

## 2024-11-12 DIAGNOSIS — F330 Major depressive disorder, recurrent, mild: Secondary | ICD-10-CM
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
## 2024-11-16 ENCOUNTER — Ambulatory Visit: Admitting: Occupational Therapy

## 2024-11-16 ENCOUNTER — Ambulatory Visit

## 2024-11-16 DIAGNOSIS — R278 Other lack of coordination: Secondary | ICD-10-CM | POA: Diagnosis not present

## 2024-11-16 DIAGNOSIS — R2681 Unsteadiness on feet: Secondary | ICD-10-CM | POA: Diagnosis not present

## 2024-11-16 DIAGNOSIS — R293 Abnormal posture: Secondary | ICD-10-CM | POA: Diagnosis not present

## 2024-11-16 DIAGNOSIS — M542 Cervicalgia: Secondary | ICD-10-CM

## 2024-11-16 DIAGNOSIS — M6281 Muscle weakness (generalized): Secondary | ICD-10-CM | POA: Diagnosis not present

## 2024-11-16 DIAGNOSIS — M4712 Other spondylosis with myelopathy, cervical region: Secondary | ICD-10-CM

## 2024-11-16 DIAGNOSIS — R2689 Other abnormalities of gait and mobility: Secondary | ICD-10-CM

## 2024-11-16 DIAGNOSIS — R490 Dysphonia: Secondary | ICD-10-CM | POA: Diagnosis not present

## 2024-11-16 DIAGNOSIS — M4722 Other spondylosis with radiculopathy, cervical region: Secondary | ICD-10-CM | POA: Diagnosis not present
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
## 2024-11-22 ENCOUNTER — Ambulatory Visit: Admitting: Occupational Therapy

## 2024-11-22 ENCOUNTER — Ambulatory Visit: Admitting: Physical Therapy

## 2024-11-22 DIAGNOSIS — R278 Other lack of coordination: Secondary | ICD-10-CM | POA: Diagnosis present

## 2024-11-22 DIAGNOSIS — M4722 Other spondylosis with radiculopathy, cervical region: Secondary | ICD-10-CM | POA: Diagnosis present

## 2024-11-22 DIAGNOSIS — M6281 Muscle weakness (generalized): Secondary | ICD-10-CM

## 2024-11-22 DIAGNOSIS — R2681 Unsteadiness on feet: Secondary | ICD-10-CM | POA: Diagnosis present

## 2024-11-22 DIAGNOSIS — R293 Abnormal posture: Secondary | ICD-10-CM | POA: Insufficient documentation

## 2024-11-22 DIAGNOSIS — R490 Dysphonia: Secondary | ICD-10-CM | POA: Insufficient documentation

## 2024-11-22 DIAGNOSIS — M4712 Other spondylosis with myelopathy, cervical region: Secondary | ICD-10-CM | POA: Insufficient documentation

## 2024-11-22 DIAGNOSIS — M542 Cervicalgia: Secondary | ICD-10-CM | POA: Insufficient documentation

## 2024-11-22 DIAGNOSIS — R2689 Other abnormalities of gait and mobility: Secondary | ICD-10-CM | POA: Insufficient documentation
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
## 2024-11-23 NOTE — Progress Notes (Deleted)
    SUBJECTIVE:   CHIEF COMPLAINT: vaginal irritation HPI:   Jenny [name] is a 63 y.o.  with history notable for type 2 DM and history of colon cancer presenting for follow up.   Discussed the use of AI scribe software for clinical note transcription with the patient, who gave verbal consent to proceed.  History of Present Illness      PERTINENT  PMH / PSH/Family/Social History : sigmoid colon cancer, muscle tension dysphonia   OBJECTIVE:   There were no vitals taken for this visit.  Today's weight:  Review of prior weights: There were no vitals filed for this visit.  RRR Lungs clear bilaterally GU Exam:    External exam: Normal-appearing female external genitalia.  Vaginal exam notable for ***.  Cervix without discharge or obvious lesion.  Bimanual exam reveals normal sized uterus no masses appreciated, no pain with examination.***  Chaperoned examine, CMA ***.    ASSESSMENT/PLAN:   Assessment & Plan Type 2 diabetes mellitus without complication, without long-term current use of insulin  (HCC)  Chemotherapy-induced peripheral neuropathy  Vaginal irritation     [name], MD  Family Medicine Teaching Service  [facility]

## 2024-11-24 ENCOUNTER — Ambulatory Visit: Admitting: Occupational Therapy

## 2024-11-24 ENCOUNTER — Ambulatory Visit

## 2024-11-25 ENCOUNTER — Encounter: Payer: Self-pay | Admitting: Family Medicine

## 2024-11-25 ENCOUNTER — Other Ambulatory Visit (HOSPITAL_COMMUNITY)
Admission: RE | Admit: 2024-11-25 | Discharge: 2024-11-25 | Disposition: A | Source: Ambulatory Visit | Attending: Family Medicine | Admitting: Family Medicine

## 2024-11-25 ENCOUNTER — Ambulatory Visit: Admitting: Family Medicine

## 2024-11-25 VITALS — BP 120/70 | HR 75 | Ht 65.0 in | Wt 225.2 lb

## 2024-11-25 DIAGNOSIS — E119 Type 2 diabetes mellitus without complications: Secondary | ICD-10-CM | POA: Diagnosis not present

## 2024-11-25 DIAGNOSIS — G620 Drug-induced polyneuropathy: Secondary | ICD-10-CM

## 2024-11-25 DIAGNOSIS — N952 Postmenopausal atrophic vaginitis: Secondary | ICD-10-CM

## 2024-11-25 DIAGNOSIS — R030 Elevated blood-pressure reading, without diagnosis of hypertension: Secondary | ICD-10-CM | POA: Diagnosis not present

## 2024-11-25 DIAGNOSIS — N898 Other specified noninflammatory disorders of vagina: Secondary | ICD-10-CM

## 2024-11-25 MED ORDER — PREMARIN 0.625 MG/GM VA CREA: 0.7500 | TOPICAL_CREAM | Freq: Every day | VAGINAL | 12 refills | Status: AC

## 2024-11-25 MED ORDER — TIRZEPATIDE 10 MG/0.5ML ~~LOC~~ SOAJ: 10.0000 mg | SUBCUTANEOUS | 1 refills | Status: DC

## 2024-11-25 NOTE — Patient Instructions (Signed)
 It was wonderful to see you today.  Please bring ALL of your medications with you to every visit.   Today we talked about:  I sent in premarin  This is cream that comes in a tube  You squeeze this into an applicator and then place in the vagina  Use daily for 1 week then twice weekly thereafter   STOP vagisil and coconut oil   I sent in Mounjaro  refills    Please follow up in 1-2 months   Thank you for choosing [facility].   Please call [phone] with any questions about today's appointment.  Please be sure to schedule follow up at the front  desk before you leave today.   [name], MD  Family Medicine

## 2024-11-25 NOTE — Progress Notes (Addendum)
    SUBJECTIVE:   CHIEF COMPLAINT: vaginal irritation HPI:   Jenny [name] is a 63 y.o.  with history notable for type 2 DM and history of colon cancer presenting for follow up.   Discussed the use of AI scribe software for clinical note transcription with the patient, who gave verbal consent to proceed.  History of Present Illness Elevated BP - Has diagnosis of diabetes - Diastolic value elevated today - Has been taking Mobic  regularly for shoulder pain  Type 2 DM Tolerating medications well On mounjaro  10 mg weekly, no issues with this  - Lost six pounds since last visit - Weight loss attributed to grazing eating habits and not eating after 8 PM  Genitourinary symptoms - Vaginal dryness and tenderness - Attributes symptoms to diabetes and aging - No vaginal bleeding, dysuria, hematuria - Has not tried anything for this - Has irritation if she scrubs area with soap too much      PERTINENT  PMH / PSH/Family/Social History : sigmoid colon cancer, muscle tension dysphonia, hysterectomy   OBJECTIVE:   BP (!) 136/96   Pulse 73   Ht 5' 5 (1.651 m)   Wt 225 lb 3.2 oz (102.2 kg)   SpO2 99%   BMI 37.48 kg/m   Today's weight:  Last Weight  Most recent update: 11/25/2024  9:56 AM    Weight  102.2 kg (225 lb 3.2 oz)            Review of prior weights: Filed Weights   11/25/24 0955  Weight: 225 lb 3.2 oz (102.2 kg)    RRR Lungs clear bilaterally GU Exam:    External exam: Normal-appearing female external genitalia but with thinning and labial atrophy.  Vaginal exam notable for no lesions, no white appearing papules or macules, normal vulvar architecture Chaperoned examine, CMA [name] .    ASSESSMENT/PLAN:   Assessment & Plan Elevated blood pressure reading Normal on repeat Consider ARB in future if needed  BMP today given NSAID use  Vaginal atrophy Vaginal irritation Most consistent with postmenopausal vaginal and vulvar atrophy No signs of  lichen sclerosus  Rx premarin Follow up in 1 month to check symptosm  Type 2 diabetes mellitus without complication, without long-term current use of insulin  (HCC) Given weight loss on 10 mg will continue this dose of Mounjaro  at this time     [name], MD  Family Medicine Teaching Service  [facility]

## 2024-11-25 NOTE — Assessment & Plan Note (Signed)
 Given weight loss on 10 mg will continue this dose of Mounjaro  at this time

## 2024-11-26 ENCOUNTER — Ambulatory Visit: Payer: Self-pay | Admitting: Family Medicine

## 2024-11-26 ENCOUNTER — Encounter: Payer: Self-pay | Admitting: Psychiatry

## 2024-11-26 ENCOUNTER — Telehealth: Admitting: Psychiatry

## 2024-11-26 DIAGNOSIS — F330 Major depressive disorder, recurrent, mild: Secondary | ICD-10-CM | POA: Diagnosis not present

## 2024-11-26 DIAGNOSIS — G4700 Insomnia, unspecified: Secondary | ICD-10-CM

## 2024-11-26 DIAGNOSIS — B3731 Acute candidiasis of vulva and vagina: Secondary | ICD-10-CM

## 2024-11-26 DIAGNOSIS — F4310 Post-traumatic stress disorder, unspecified: Secondary | ICD-10-CM | POA: Diagnosis not present

## 2024-11-26 LAB — CERVICOVAGINAL ANCILLARY ONLY
Bacterial Vaginitis (gardnerella): NEGATIVE
Candida Glabrata: NEGATIVE
Candida Vaginitis: POSITIVE — AB
Chlamydia: NEGATIVE
Comment: NEGATIVE
Comment: NEGATIVE
Comment: NEGATIVE
Comment: NEGATIVE
Comment: NEGATIVE
Comment: NORMAL
Neisseria Gonorrhea: NEGATIVE
Trichomonas: NEGATIVE

## 2024-11-26 LAB — BASIC METABOLIC PANEL WITH GFR
BUN/Creatinine Ratio: 26 (ref 12–28)
BUN: 19 mg/dL (ref 8–27)
CO2: 21 mmol/L (ref 20–29)
Calcium: 9.2 mg/dL (ref 8.7–10.3)
Chloride: 104 mmol/L (ref 96–106)
Creatinine, Ser: 0.74 mg/dL (ref 0.57–1.00)
Glucose: 94 mg/dL (ref 70–99)
Potassium: 4.1 mmol/L (ref 3.5–5.2)
Sodium: 140 mmol/L (ref 134–144)
eGFR: 92 mL/min/1.73 (ref >59)

## 2024-11-26 MED ORDER — TRAZODONE HCL 50 MG PO TABS: 50.0000 mg | ORAL_TABLET | Freq: Every evening | ORAL | 1 refills | Status: AC | PRN

## 2024-11-26 NOTE — Patient Instructions (Signed)
 Continue venlafaxine  187.5  mg daily  Continue bupropion  450 mg (300 mg + 150 mg) daily Continue Abilify  10 mg daily  Continue trazodone  25-50 mg at night as needed for sleep Next appointment: 2/4 at 11:30

## 2024-11-29 ENCOUNTER — Ambulatory Visit: Admitting: Occupational Therapy

## 2024-11-29 ENCOUNTER — Ambulatory Visit: Admitting: Speech Pathology

## 2024-11-29 DIAGNOSIS — M6281 Muscle weakness (generalized): Secondary | ICD-10-CM

## 2024-11-29 DIAGNOSIS — R490 Dysphonia: Secondary | ICD-10-CM

## 2024-11-29 DIAGNOSIS — R2681 Unsteadiness on feet: Secondary | ICD-10-CM | POA: Diagnosis not present
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
## 2024-11-29 NOTE — Therapy (Signed)
 OUTPATIENT SPEECH LANGUAGE PATHOLOGY  RE-VOICE EVALUATION   Patient Name: Jenny [name] MRN: [id number] DOB:12-17-1961, 63 y.o., female Today's Date: 11/29/2024  PCP: [name], MD REFERRING PROVIDER: Carolee Hunter, MD   End of Session - 11/29/24 0850     Visit Number 1    Number of Visits 17    Date for Recertification  01/24/25    Authorization Type [employer]    Progress Note Due on Visit 10    SLP Start Time 0850    SLP Stop Time  0925    SLP Time Calculation (min) 35 min    Activity Tolerance Patient tolerated treatment well;Patient limited by fatigue;Other (comment)   pt falling asleep at the end of session         Past Medical History:  Diagnosis Date   Anemia    Anxiety    Arthritis    Cancer (HCC)    colon cancer    Complication of anesthesia    Hard to wake up   Depression    Diabetes mellitus, type 2 (HCC)    Enlarged thyroid     Fatty liver    Past Surgical History:  Procedure Laterality Date   ANTERIOR CERVICAL DECOMP/DISCECTOMY FUSION N/A 06/12/2023   Procedure: ANTERIOR CERVICAL DISCECTOMY AND FUSION, CERVICAL THREE-CERVICAL FOUR;  Surgeon: Louis Shove, MD;  Location: [facility];  Service: Neurosurgery;  Laterality: N/A;  3C   BOWEL RESECTION     CERVICAL SPINE SURGERY  06/17/2012   C5-C7 ACDF   CESAREAN SECTION     PARTIAL HYSTERECTOMY     PORT-A-CATH REMOVAL Left 07/28/2015   Procedure: REMOVAL PORT-A-CATH;  Surgeon: Bernarda Ned, MD;  Location: [facility];  Service: General;  Laterality: Left;   PORTACATH PLACEMENT Left 12/22/2014   Procedure: INSERTION PORT-A-CATH LEFT SUBCLAVIAN;  Surgeon: Bernarda Ned, MD;  Location: [facility];  Service: General;  Laterality: Left;   THYROIDECTOMY N/A 01/08/2024   Procedure: TOTAL THYROIDECTOMY;  Surgeon: Eletha Boas, MD;  Location: [facility];  Service: General;  Laterality: N/A;   TONSILLECTOMY     Patient Active Problem List   Diagnosis Date Noted   Gait instability 09/16/2024   Cervical spondylosis  with myelopathy and radiculopathy 06/12/2023   Hypercholesteremia 09/27/2021   Moderate recurrent major depression (HCC) 12/23/2019   PTSD (post-traumatic stress disorder) 12/23/2019   Adjustment disorder with mixed anxiety and depressed mood 02/11/2018   Chemotherapy-induced peripheral neuropathy 01/06/2016   Type 2 diabetes mellitus without complications (HCC) 12/07/2015   History of thyroidectomy 12/14/2014   History of colon cancer 11/14/2014   Obesity 05/29/2009    ONSET DATE:  01/08/2024: Date of referral 08/13/2024: date of ENT referral 10/20/2024  REFERRING DIAG: R49.0 (ICD-10-CM) - Hoarseness   THERAPY DIAG:  Dysphonia  Rationale for Evaluation and Treatment Rehabilitation  SUBJECTIVE:   SUBJECTIVE STATEMENT: Pt pleasant, good historian Pt accompanied by: self  PERTINENT HISTORY and DIAGNOSTIC FINDINGS: Pt is a 63 year old female who presents with hoarseness following her total thyroidectomy on 01/08/2024 d/t  multinodular thyroid  goiter. Pt with history of dysphagia d/t ACDF and potential impact of goiter. Pt with past medical history of type 2 diabetes mellitus, ataxic gait, MAFLD, PTSD and memory changes.   Modified Barium Swallow Study - 06/16/2023 Pt presents with a moderate pharyngoesophageal dysphagia c/b reduced pharyngeal stripping wave, minimal epiglottic deflection presumed 2/2 hypopharyngeal edema, incomplete laryngeal closure, reduced UES opening with suspected CP bar (see images below), and esophageal retention and backflow. These deficits resulted in sensed,  trace penetration of thin liquid which pt was able to fully clear with spontaneous cough and reswallow. With nectar thick liquid, puree, and solid textures there was no penetration.   Modified Barium Swallow Study - 03/16/2020 Pt presents with adequate oropharyngeal abilities.   ENT Evaluation 10/20/2024 Laryngoscopy revealed: Edema and cobblestoning, interarytenoid notch: interarytenoid  Muscle  Tension Dysphonia - Reflux findings on exam as well as significant muscle tension dysphonia. Some mild edema of vibratory margin of vocal folds but no lesion. I think this may be some due to her significant squeezing. Good mobility of vocal folds with no obvious weakness.  Laryngopharyngeal reflux: Omeprazole  20mg  prior to first meal of the day.   PAIN:  Are you having pain? No   FALLS: Has patient fallen in last 6 months? No,   LIVING ENVIRONMENT: Lives with: lives with their family and lives with their spouse Lives in: House/apartment  PLOF: Independent  PATIENT GOALS    to improve her voice  OBJECTIVE:  COGNITION: Overall cognitive status: No family/caregiver present to determine baseline cognitive functioning: pending referral to neurologist for potential memory/attention deficits on 12/01/2024  SOCIAL HISTORY: Occupation: retired Publishing Rights Manager intake: optimal Caffeine/alcohol intake: minimal Daily voice use: excessive Environmental risks: Noisy environment and Stressful environment Occupational risks: Other: retired runner, broadcasting/film/video Misuse: Speaks excessively, Excessively low pitch, Glottal fry, Strain, Tension, Speaks without adequate warm-up, Speaks without adequate breath support, and Speaks on residual capacity Phonotraumatic behaviors: Speaks in noise, Performs or speaks to large groups without amplification, Speaks at a great distance from listeners, Excessive voice use, and Excessive and/or habitual throat clearing  PERCEPTUAL VOICE ASSESSMENT: Voice quality: hoarse, breathy, harsh, rough, strained, low vocal intensity, diplophonia, vocal fatigue, and aphonic Vocal abuse: habitual throat clearing, excessive voice use, and habitual abnormal pitch Resonance: normal Respiratory function: clavicular breathing  OBJECTIVE VOICE ASSESSMENT: Sustained ah maximum phonation time: 2.2 seconds Sustained ah loudness average: 76 dB Average fundamental frequency during sustained  "ah":135 Hz   ( 4 SD below average of  244 Hz +/- 27 for gender)  Oral reading (passage) loudness average: 72 dB Oral reading loudness range: 18 dB Conversational pitch average: 111 Hz Conversational loudness average: 73 dB S/z ratio: 2.3 (Suggestive of dysfunction >1.0) Voice quality: hoarse, breathy, harsh, rough, strained, low vocal intensity, diplophonia, vocal fatigue, and aphonic    ORAL MOTOR EXAMINATION Facial : WFL Lingual: WFL Velum: WFL Mandible: WFL Cough: WFL  PATIENT REPORTED OUTCOME MEASURE   VOICE HANDICAP INDEX (VHI)  The Voice Handicap Index is comprised of a series of questions to assess the patient's perception of their voice. It is designed to evaluate the emotional, physical and functional components of the voice problem.  Functional: 31 Physical: 40 Emotional: 39 Total: 110 (Normal mean 8.75, SD =14.97)  z score =  6.7  severe = 3.00+   TODAY'S TREATMENT NOTE: Skilled treatment focused on providing skilled written and verbal education on muscle tension dysfunction with use of diagram and picture of pt's own vocal cords at rest and during phonation (as provided by pt's ENT).   PATIENT EDUCATION: Education details: results of this assessment, ST POC Person educated: Patient Education method: Explanation Education comprehension: verbalized understanding  GOALS: Goals reviewed with patient? Yes  SHORT TERM GOALS: Target date: 10 sessions  1.Pt will use abdominal breathing when producing single words to support phonation in 80% of trials across 3 data sessions.  Baseline: Goal status: INITIAL  2.  The patient will eliminate phonotraumatic behaviors such as chronic throat  clearing, by substituting non-traumatic methods to clear mucus.  Baseline:  Goal status: INITIAL  3.  The patient will utilize a forward tone focused/resonant voice to decrease vocal hyperfunction and improve voice quality and vocal projection at the word level with 75% accuracy.   Baseline:  Goal status: INITIAL  4.  The patient will decrease laryngeal and articulatory muscle tension by independently completing relaxation/stretching exercises.  Baseline:  Goal status: INITIAL   LONG TERM GOALS: Target date: 01/24/2025  Patient will report improved communication effectiveness as measured by PROM Baseline:  Goal status: INITIAL  2.  With Supervision A, patient will use voice strategies at the sentence level in 80% of opportunities to effectively communicate theirs wants and needs.  Baseline:  Goal status: INITIAL     ASSESSMENT:  CLINICAL IMPRESSION: Patient is a 63 y.o. female who was seen today for a voice evaluation d/t severe dysphonia related to muscle tension dysphonia.  Pt's dysphonia is c/b hoarse, breathy, strained, tense, glottal fry with aphonia observed after talking for several minutes. As a result, pt is not able to readily communicate her wants and needs effectively.   OBJECTIVE IMPAIRMENTS include voice disorder. These impairments are limiting patient from effectively communicating at home and in community. Factors affecting potential to achieve goals and functional outcome are severity of impairments. Patient will benefit from skilled SLP services to address above impairments and improve overall function.  REHAB POTENTIAL: Good  PLAN:   SLP FREQUENCY: 1-2x/week  SLP DURATION: 8 weeks  PLANNED INTERVENTIONS: SLP instruction and feedback, Compensatory strategies, and Patient/family education   Raywood Wailes B. Rubbie, M.S., CCC-SLP, Tree Surgeon Certified Brain Injury Specialist [facility]  [facility] Rehabilitation Services Office [phone] Ascom [phone] Fax [phone]

## 2024-11-30 MED ORDER — FLUCONAZOLE 150 MG PO TABS: 150.0000 mg | ORAL_TABLET | Freq: Once | ORAL | 0 refills | Status: AC

## 2024-11-30 NOTE — Telephone Encounter (Signed)
 Called with results. Rx fluconazole . All questions answered. [name], MD  Family Medicine Teaching Service

## 2024-11-30 NOTE — Addendum Note (Signed)
 Addended by: DELORES, Deng Kemler on: 11/30/2024 10:09 AM   Modules accepted: Orders

## 2024-12-03 ENCOUNTER — Ambulatory Visit: Admitting: Speech Pathology

## 2024-12-03 ENCOUNTER — Ambulatory Visit (INDEPENDENT_AMBULATORY_CARE_PROVIDER_SITE_OTHER): Admitting: Psychiatry

## 2024-12-03 DIAGNOSIS — F330 Major depressive disorder, recurrent, mild: Secondary | ICD-10-CM | POA: Diagnosis not present

## 2024-12-03 DIAGNOSIS — F329 Major depressive disorder, single episode, unspecified: Secondary | ICD-10-CM

## 2024-12-03 DIAGNOSIS — F4310 Post-traumatic stress disorder, unspecified: Secondary | ICD-10-CM | POA: Diagnosis not present
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
## 2024-12-06 ENCOUNTER — Ambulatory Visit

## 2024-12-06 ENCOUNTER — Ambulatory Visit: Admitting: Speech Pathology

## 2024-12-06 DIAGNOSIS — R278 Other lack of coordination: Secondary | ICD-10-CM

## 2024-12-06 DIAGNOSIS — M6281 Muscle weakness (generalized): Secondary | ICD-10-CM

## 2024-12-06 DIAGNOSIS — R490 Dysphonia: Secondary | ICD-10-CM

## 2024-12-06 DIAGNOSIS — R293 Abnormal posture: Secondary | ICD-10-CM

## 2024-12-06 DIAGNOSIS — R2681 Unsteadiness on feet: Secondary | ICD-10-CM | POA: Diagnosis not present

## 2024-12-06 DIAGNOSIS — M4712 Other spondylosis with myelopathy, cervical region: Secondary | ICD-10-CM

## 2024-12-06 DIAGNOSIS — R2689 Other abnormalities of gait and mobility: Secondary | ICD-10-CM

## 2024-12-06 DIAGNOSIS — M542 Cervicalgia: Secondary | ICD-10-CM

## 2024-12-06 NOTE — Therapy (Signed)
 OUTPATIENT SPEECH LANGUAGE PATHOLOGY  VOICE TREATMENT NOTE   Patient Name: Jenny [name] MRN: [id number] DOB:August 12, 1961, 63 y.o., female Today's Date: 12/06/2024  PCP: [name], MD REFERRING PROVIDER: Carolee Hunter, MD   End of Session - 12/06/24 0954     Visit Number 2    Number of Visits 17    Date for Recertification  01/24/25    Authorization Type [employer]    Authorization Time Period 11/29/2024 thru 02/27/2025    Authorization - Visit Number 2    Authorization - Number of Visits 16    Progress Note Due on Visit 10    SLP Start Time 0900    SLP Stop Time  0930    SLP Time Calculation (min) 30 min    Activity Tolerance Patient tolerated treatment well          Past Medical History:  Diagnosis Date   Anemia    Anxiety    Arthritis    Cancer (HCC)    colon cancer    Complication of anesthesia    Hard to wake up   Depression    Diabetes mellitus, type 2 (HCC)    Enlarged thyroid     Fatty liver    Past Surgical History:  Procedure Laterality Date   ANTERIOR CERVICAL DECOMP/DISCECTOMY FUSION N/A 06/12/2023   Procedure: ANTERIOR CERVICAL DISCECTOMY AND FUSION, CERVICAL THREE-CERVICAL FOUR;  Surgeon: Louis Shove, MD;  Location: [facility];  Service: Neurosurgery;  Laterality: N/A;  3C   BOWEL RESECTION     CERVICAL SPINE SURGERY  06/17/2012   C5-C7 ACDF   CESAREAN SECTION     PARTIAL HYSTERECTOMY     PORT-A-CATH REMOVAL Left 07/28/2015   Procedure: REMOVAL PORT-A-CATH;  Surgeon: Bernarda Ned, MD;  Location: [facility];  Service: General;  Laterality: Left;   PORTACATH PLACEMENT Left 12/22/2014   Procedure: INSERTION PORT-A-CATH LEFT SUBCLAVIAN;  Surgeon: Bernarda Ned, MD;  Location: [facility];  Service: General;  Laterality: Left;   THYROIDECTOMY N/A 01/08/2024   Procedure: TOTAL THYROIDECTOMY;  Surgeon: Eletha Boas, MD;  Location: [facility];  Service: General;  Laterality: N/A;   TONSILLECTOMY     Patient Active Problem List   Diagnosis Date Noted    Gait instability 09/16/2024   Cervical spondylosis with myelopathy and radiculopathy 06/12/2023   Hypercholesteremia 09/27/2021   Moderate recurrent major depression (HCC) 12/23/2019   PTSD (post-traumatic stress disorder) 12/23/2019   Adjustment disorder with mixed anxiety and depressed mood 02/11/2018   Chemotherapy-induced peripheral neuropathy 01/06/2016   Type 2 diabetes mellitus without complications (HCC) 12/07/2015   History of thyroidectomy 12/14/2014   History of colon cancer 11/14/2014   Obesity 05/29/2009    ONSET DATE:  01/08/2024: Date of referral 08/13/2024: date of ENT referral 10/20/2024  REFERRING DIAG: R49.0 (ICD-10-CM) - Hoarseness   THERAPY DIAG:  Dysphonia  Rationale for Evaluation and Treatment Rehabilitation  SUBJECTIVE:   PERTINENT HISTORY and DIAGNOSTIC FINDINGS: Pt is a 64 year old female who presents with hoarseness following her total thyroidectomy on 01/08/2024 d/t  multinodular thyroid  goiter. Pt with history of dysphagia d/t ACDF and potential impact of goiter. Pt with past medical history of type 2 diabetes mellitus, ataxic gait, MAFLD, PTSD and memory changes.   Modified Barium Swallow Study - 06/16/2023 Pt presents with a moderate pharyngoesophageal dysphagia c/b reduced pharyngeal stripping wave, minimal epiglottic deflection presumed 2/2 hypopharyngeal edema, incomplete laryngeal closure, reduced UES opening with suspected CP bar (see images below), and esophageal retention and backflow. These deficits  resulted in sensed, trace penetration of thin liquid which pt was able to fully clear with spontaneous cough and reswallow. With nectar thick liquid, puree, and solid textures there was no penetration.   Modified Barium Swallow Study - 03/16/2020 Pt presents with adequate oropharyngeal abilities.   ENT Evaluation 10/20/2024 Laryngoscopy revealed: Edema and cobblestoning, interarytenoid notch: interarytenoid  Muscle Tension Dysphonia - Reflux  findings on exam as well as significant muscle tension dysphonia. Some mild edema of vibratory margin of vocal folds but no lesion. I think this may be some due to her significant squeezing. Good mobility of vocal folds with no obvious weakness.  Laryngopharyngeal reflux: Omeprazole  20mg  prior to first meal of the day.   PAIN:  Are you having pain? No   FALLS: Has patient fallen in last 6 months? No,   LIVING ENVIRONMENT: Lives with: lives with their family and lives with their spouse Lives in: House/apartment  PLOF: Independent  PATIENT GOALS    to improve her voice  SUBJECTIVE STATEMENT: Pt pleasant, limited fatigue, arrived 15 minutes late to session Pt accompanied by: self  OBJECTIVE:   TODAY'S TREATMENT NOTE: Skilled treatment session focused on pt's dysphonia goals. SLP facilitated session by providing the following interventions:  Skilled verbal and written information provided How the Voice works with visual images shown of pt's vocal cords during inhalation and phonation (as provided by ENT), hydration and mucus management including pectin based throat lozenge and finally management of throat clearing. Pt voiced understanding of information.   Pt instructed in flow phonation with good initial moments of improved phonation.     PATIENT EDUCATION: Education details: as above Person educated: Patient Education method: Explanation Education comprehension: verbalized understanding  GOALS: Goals reviewed with patient? Yes  SHORT TERM GOALS: Target date: 10 sessions  1.Pt will use abdominal breathing when producing single words to support phonation in 80% of trials across 3 data sessions.  Baseline: Goal status: INITIAL  2.  The patient will eliminate phonotraumatic behaviors such as chronic throat clearing, by substituting non-traumatic methods to clear mucus.  Baseline:  Goal status: INITIAL  3.  The patient will utilize a forward tone focused/resonant voice  to decrease vocal hyperfunction and improve voice quality and vocal projection at the word level with 75% accuracy.  Baseline:  Goal status: INITIAL  4.  The patient will decrease laryngeal and articulatory muscle tension by independently completing relaxation/stretching exercises.  Baseline:  Goal status: INITIAL   LONG TERM GOALS: Target date: 01/24/2025  Patient will report improved communication effectiveness as measured by PROM Baseline:  Goal status: INITIAL  2.  With Supervision A, patient will use voice strategies at the sentence level in 80% of opportunities to effectively communicate theirs wants and needs.  Baseline:  Goal status: INITIAL     ASSESSMENT:  CLINICAL IMPRESSION: Patient is a 63 y.o. female who was seen today for a voice treatment d/t severe dysphonia related to muscle tension dysphonia.  Pt's dysphonia is c/b hoarse, breathy, strained, tense, glottal fry with aphonia observed after talking for several minutes. As a result, pt is not able to readily communicate her wants and needs effectively.   OBJECTIVE IMPAIRMENTS include voice disorder. These impairments are limiting patient from effectively communicating at home and in community. Factors affecting potential to achieve goals and functional outcome are severity of impairments. Patient will benefit from skilled SLP services to address above impairments and improve overall function.  REHAB POTENTIAL: Good  PLAN:   SLP FREQUENCY: 1-2x/week  SLP DURATION: 8 weeks  PLANNED INTERVENTIONS: SLP instruction and feedback, Compensatory strategies, and Patient/family education   Chaynce Schafer B. Rubbie, M.S., CCC-SLP, Tree Surgeon Certified Brain Injury Specialist [facility]  [facility] Rehabilitation Services Office [phone] Ascom [phone] Fax [phone]
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
## 2024-12-09 ENCOUNTER — Ambulatory Visit: Admitting: Speech Pathology

## 2024-12-09 DIAGNOSIS — R490 Dysphonia: Secondary | ICD-10-CM

## 2024-12-09 DIAGNOSIS — R2681 Unsteadiness on feet: Secondary | ICD-10-CM | POA: Diagnosis not present

## 2024-12-09 NOTE — Therapy (Signed)
OUTPATIENT SPEECH LANGUAGE PATHOLOGY  VOICE TREATMENT NOTE   Patient Name: Jenny [name] MRN: [id number] DOB:1961/05/13, 63 y.o., female Today's Date: 12/09/2024  PCP: [name], MD REFERRING PROVIDER: Carolee Hunter, MD   End of Session - 12/09/24 1144     Visit Number 3    Number of Visits 17    Date for Recertification  01/24/25    Authorization Type [employer]    Authorization Time Period 11/29/2024 thru 02/27/2025    Authorization - Visit Number 3    Authorization - Number of Visits 16    Progress Note Due on Visit 10    SLP Start Time 1145    SLP Stop Time  1230    SLP Time Calculation (min) 45 min    Activity Tolerance Patient tolerated treatment well          Past Medical History:  Diagnosis Date   Anemia    Anxiety    Arthritis    Cancer (HCC)    colon cancer    Complication of anesthesia    Hard to wake up   Depression    Diabetes mellitus, type 2 (HCC)    Enlarged thyroid     Fatty liver    Past Surgical History:  Procedure Laterality Date   ANTERIOR CERVICAL DECOMP/DISCECTOMY FUSION N/A 06/12/2023   Procedure: ANTERIOR CERVICAL DISCECTOMY AND FUSION, CERVICAL THREE-CERVICAL FOUR;  Surgeon: Louis Shove, MD;  Location: [facility];  Service: Neurosurgery;  Laterality: N/A;  3C   BOWEL RESECTION     CERVICAL SPINE SURGERY  06/17/2012   C5-C7 ACDF   CESAREAN SECTION     PARTIAL HYSTERECTOMY     PORT-A-CATH REMOVAL Left 07/28/2015   Procedure: REMOVAL PORT-A-CATH;  Surgeon: Bernarda Ned, MD;  Location: [facility];  Service: General;  Laterality: Left;   PORTACATH PLACEMENT Left 12/22/2014   Procedure: INSERTION PORT-A-CATH LEFT SUBCLAVIAN;  Surgeon: Bernarda Ned, MD;  Location: [facility];  Service: General;  Laterality: Left;   THYROIDECTOMY N/A 01/08/2024   Procedure: TOTAL THYROIDECTOMY;  Surgeon: Eletha Boas, MD;  Location: [facility];  Service: General;  Laterality: N/A;   TONSILLECTOMY     Patient Active Problem List   Diagnosis Date Noted    Gait instability 09/16/2024   Cervical spondylosis with myelopathy and radiculopathy 06/12/2023   Hypercholesteremia 09/27/2021   Moderate recurrent major depression (HCC) 12/23/2019   PTSD (post-traumatic stress disorder) 12/23/2019   Adjustment disorder with mixed anxiety and depressed mood 02/11/2018   Chemotherapy-induced peripheral neuropathy 01/06/2016   Type 2 diabetes mellitus without complications (HCC) 12/07/2015   History of thyroidectomy 12/14/2014   History of colon cancer 11/14/2014   Obesity 05/29/2009    ONSET DATE:  01/08/2024: Date of referral 08/13/2024: date of ENT referral 10/20/2024  REFERRING DIAG: R49.0 (ICD-10-CM) - Hoarseness   THERAPY DIAG:  Dysphonia  Rationale for Evaluation and Treatment Rehabilitation  SUBJECTIVE:   PERTINENT HISTORY and DIAGNOSTIC FINDINGS: Pt is a 63 year old female who presents with hoarseness following her total thyroidectomy on 01/08/2024 d/t  multinodular thyroid  goiter. Pt with history of dysphagia d/t ACDF and potential impact of goiter. Pt with past medical history of type 2 diabetes mellitus, ataxic gait, MAFLD, PTSD and memory changes.   Modified Barium Swallow Study - 06/16/2023 Pt presents with a moderate pharyngoesophageal dysphagia c/b reduced pharyngeal stripping wave, minimal epiglottic deflection presumed 2/2 hypopharyngeal edema, incomplete laryngeal closure, reduced UES opening with suspected CP bar (see images below), and esophageal retention and backflow. These deficits  resulted in sensed, trace penetration of thin liquid which pt was able to fully clear with spontaneous cough and reswallow. With nectar thick liquid, puree, and solid textures there was no penetration.   Modified Barium Swallow Study - 03/16/2020 Pt presents with adequate oropharyngeal abilities.   ENT Evaluation 10/20/2024 Laryngoscopy revealed: Edema and cobblestoning, interarytenoid notch: interarytenoid  Muscle Tension Dysphonia - Reflux  findings on exam as well as significant muscle tension dysphonia. Some mild edema of vibratory margin of vocal folds but no lesion. I think this may be some due to her significant squeezing. Good mobility of vocal folds with no obvious weakness.  Laryngopharyngeal reflux: Omeprazole  20mg  prior to first meal of the day.   PAIN:  Are you having pain? No   FALLS: Has patient fallen in last 6 months? No,   LIVING ENVIRONMENT: Lives with: lives with their family and lives with their spouse Lives in: House/apartment  PLOF: Independent  PATIENT GOALS    to improve her voice  SUBJECTIVE STATEMENT: Pt pleasant,  Pt accompanied by: self  OBJECTIVE:   TODAY'S TREATMENT NOTE: Skilled treatment session focused on pt's dysphonia goals. SLP facilitated session by providing the following interventions:  Pt reports increased pain with neck stretches. This writer instructed her to discontinue. Pt benefited from maximal instruction thru flow phonation - improvement noted    PATIENT EDUCATION: Education details: as above Person educated: Patient Education method: Explanation Education comprehension: verbalized understanding  GOALS: Goals reviewed with patient? Yes  SHORT TERM GOALS: Target date: 10 sessions  1.Pt will use abdominal breathing when producing single words to support phonation in 80% of trials across 3 data sessions.  Baseline: Goal status: INITIAL  2.  The patient will eliminate phonotraumatic behaviors such as chronic throat clearing, by substituting non-traumatic methods to clear mucus.  Baseline:  Goal status: INITIAL  3.  The patient will utilize a forward tone focused/resonant voice to decrease vocal hyperfunction and improve voice quality and vocal projection at the word level with 75% accuracy.  Baseline:  Goal status: INITIAL  4.  The patient will decrease laryngeal and articulatory muscle tension by independently completing relaxation/stretching exercises.   Baseline:  Goal status: INITIAL   LONG TERM GOALS: Target date: 01/24/2025  Patient will report improved communication effectiveness as measured by PROM Baseline:  Goal status: INITIAL  2.  With Supervision A, patient will use voice strategies at the sentence level in 80% of opportunities to effectively communicate theirs wants and needs.  Baseline:  Goal status: INITIAL     ASSESSMENT:  CLINICAL IMPRESSION: Patient is a 63 y.o. female who was seen today for a voice treatment d/t severe dysphonia related to muscle tension dysphonia.  Pt's dysphonia is c/b hoarse, breathy, strained, tense, glottal fry with aphonia observed after talking for several minutes. As a result, pt is not able to readily communicate her wants and needs effectively.   OBJECTIVE IMPAIRMENTS include voice disorder. These impairments are limiting patient from effectively communicating at home and in community. Factors affecting potential to achieve goals and functional outcome are severity of impairments. Patient will benefit from skilled SLP services to address above impairments and improve overall function.  REHAB POTENTIAL: Good  PLAN:   SLP FREQUENCY: 1-2x/week  SLP DURATION: 8 weeks  PLANNED INTERVENTIONS: SLP instruction and feedback, Compensatory strategies, and Patient/family education   Broady Lafoy B. Rubbie, M.S., CCC-SLP, CBIS Speech-Language Pathologist Certified Brain Injury Specialist [facility]  [facility] Office [phone] Ascom [phone] Fax  [phone] ° ° °
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
## 2024-12-14 ENCOUNTER — Ambulatory Visit

## 2024-12-14 ENCOUNTER — Ambulatory Visit: Admitting: Speech Pathology

## 2024-12-14 ENCOUNTER — Ambulatory Visit: Admitting: Occupational Therapy

## 2024-12-14 DIAGNOSIS — R278 Other lack of coordination: Secondary | ICD-10-CM

## 2024-12-14 DIAGNOSIS — M4712 Other spondylosis with myelopathy, cervical region: Secondary | ICD-10-CM

## 2024-12-14 DIAGNOSIS — R2689 Other abnormalities of gait and mobility: Secondary | ICD-10-CM

## 2024-12-14 DIAGNOSIS — M6281 Muscle weakness (generalized): Secondary | ICD-10-CM

## 2024-12-14 DIAGNOSIS — R2681 Unsteadiness on feet: Secondary | ICD-10-CM | POA: Diagnosis not present

## 2024-12-14 DIAGNOSIS — M542 Cervicalgia: Secondary | ICD-10-CM

## 2024-12-14 DIAGNOSIS — R490 Dysphonia: Secondary | ICD-10-CM

## 2024-12-14 DIAGNOSIS — R293 Abnormal posture: Secondary | ICD-10-CM

## 2024-12-14 NOTE — Therapy (Signed)
 " OUTPATIENT SPEECH LANGUAGE PATHOLOGY  VOICE TREATMENT NOTE   Patient Name: Jenny [name] MRN: [id number] DOB:1961/09/19, 63 y.o., female Today's Date: 12/14/2024  PCP: [name], MD REFERRING PROVIDER: Carolee Hunter, MD   End of Session - 12/14/24 0901     Visit Number 4    Number of Visits 17    Date for Recertification  01/24/25    Authorization Type [employer]    Authorization Time Period 11/29/2024 thru 02/27/2025    Authorization - Visit Number 4    Authorization - Number of Visits 16    Progress Note Due on Visit 10    SLP Start Time 0900    SLP Stop Time  0915    SLP Time Calculation (min) 15 min    Activity Tolerance Patient limited by pain   session discontinued d/t inability to focus on therapy because of pt reported 10 out of 10 pain         Past Medical History:  Diagnosis Date   Anemia    Anxiety    Arthritis    Cancer (HCC)    colon cancer    Complication of anesthesia    Hard to wake up   Depression    Diabetes mellitus, type 2 (HCC)    Enlarged thyroid     Fatty liver    Past Surgical History:  Procedure Laterality Date   ANTERIOR CERVICAL DECOMP/DISCECTOMY FUSION N/A 06/12/2023   Procedure: ANTERIOR CERVICAL DISCECTOMY AND FUSION, CERVICAL THREE-CERVICAL FOUR;  Surgeon: Louis Shove, MD;  Location: [facility];  Service: Neurosurgery;  Laterality: N/A;  3C   BOWEL RESECTION     CERVICAL SPINE SURGERY  06/17/2012   C5-C7 ACDF   CESAREAN SECTION     PARTIAL HYSTERECTOMY     PORT-A-CATH REMOVAL Left 07/28/2015   Procedure: REMOVAL PORT-A-CATH;  Surgeon: Bernarda Ned, MD;  Location: [facility];  Service: General;  Laterality: Left;   PORTACATH PLACEMENT Left 12/22/2014   Procedure: INSERTION PORT-A-CATH LEFT SUBCLAVIAN;  Surgeon: Bernarda Ned, MD;  Location: [facility];  Service: General;  Laterality: Left;   THYROIDECTOMY N/A 01/08/2024   Procedure: TOTAL THYROIDECTOMY;  Surgeon: Eletha Boas, MD;  Location: [facility];  Service: General;   Laterality: N/A;   TONSILLECTOMY     Patient Active Problem List   Diagnosis Date Noted   Gait instability 09/16/2024   Cervical spondylosis with myelopathy and radiculopathy 06/12/2023   Hypercholesteremia 09/27/2021   Moderate recurrent major depression (HCC) 12/23/2019   PTSD (post-traumatic stress disorder) 12/23/2019   Adjustment disorder with mixed anxiety and depressed mood 02/11/2018   Chemotherapy-induced peripheral neuropathy 01/06/2016   Type 2 diabetes mellitus without complications (HCC) 12/07/2015   History of thyroidectomy 12/14/2014   History of colon cancer 11/14/2014   Obesity 05/29/2009    ONSET DATE:  01/08/2024: Date of referral 08/13/2024: date of ENT referral 10/20/2024  REFERRING DIAG: R49.0 (ICD-10-CM) - Hoarseness   THERAPY DIAG:  Dysphonia  Rationale for Evaluation and Treatment Rehabilitation  SUBJECTIVE:   PERTINENT HISTORY and DIAGNOSTIC FINDINGS: Pt is a 63 year old female who presents with hoarseness following her total thyroidectomy on 01/08/2024 d/t  multinodular thyroid  goiter. Pt with history of dysphagia d/t ACDF and potential impact of goiter. Pt with past medical history of type 2 diabetes mellitus, ataxic gait, MAFLD, PTSD and memory changes.   Modified Barium Swallow Study - 06/16/2023 Pt presents with a moderate pharyngoesophageal dysphagia c/b reduced pharyngeal stripping wave, minimal epiglottic deflection presumed 2/2 hypopharyngeal edema, incomplete  laryngeal closure, reduced UES opening with suspected CP bar (see images below), and esophageal retention and backflow. These deficits resulted in sensed, trace penetration of thin liquid which pt was able to fully clear with spontaneous cough and reswallow. With nectar thick liquid, puree, and solid textures there was no penetration.   Modified Barium Swallow Study - 03/16/2020 Pt presents with adequate oropharyngeal abilities.   ENT Evaluation 10/20/2024 Laryngoscopy revealed: Edema and  cobblestoning, interarytenoid notch: interarytenoid  Muscle Tension Dysphonia - Reflux findings on exam as well as significant muscle tension dysphonia. Some mild edema of vibratory margin of vocal folds but no lesion. I think this may be some due to her significant squeezing. Good mobility of vocal folds with no obvious weakness.  Laryngopharyngeal reflux: Omeprazole  20mg  prior to first meal of the day.   PAIN:  Are you having pain? No   FALLS: Has patient fallen in last 6 months? No,   LIVING ENVIRONMENT: Lives with: lives with their family and lives with their spouse Lives in: House/apartment  PLOF: Independent  PATIENT GOALS    to improve her voice  SUBJECTIVE STATEMENT: Pt pleasant, arrived 10 minutes late to session, reports that she is in significant pain (10 out of 10 pain) Pt accompanied by: self  OBJECTIVE:   TODAY'S TREATMENT NOTE: Skilled treatment session focused on pt's dysphonia goals. SLP facilitated session by providing the following interventions:  Pt arrived to session late stating I am tired, my goodness, I am tired, I try not to complain by I am tired, we must going to get some weather because my body hurts Pt walking with cane today. She took several mintues to disrobe d/t wering soft collar, maoning loudly when sitting down. Pt reports 10 out of 10 whole body pain.  Pt writhing in her chair in pain, breaks provided but she remained significantly internally distracted by whole body pain. Pt attempted to place soft collar behind her back to ease pain with no improvement.   Pt also reports reduced completion of HEP and when producing in session she required maximal cues to improve accuracy. Concern that pt might be making her dysphonia worse by incorrect nature of production.   Pt unable to effectively completed several voice activities d/t distraction - throat clears were worse throughout attempts - as cues to reduce throat clears increased, pt's pain also  appeared to in nature.     PATIENT EDUCATION: Education details: as above Person educated: Patient Education method: Explanation Education comprehension: verbalized understanding  GOALS: Goals reviewed with patient? Yes  SHORT TERM GOALS: Target date: 10 sessions  1.Pt will use abdominal breathing when producing single words to support phonation in 80% of trials across 3 data sessions.  Baseline: Goal status: INITIAL  2.  The patient will eliminate phonotraumatic behaviors such as chronic throat clearing, by substituting non-traumatic methods to clear mucus.  Baseline:  Goal status: INITIAL  3.  The patient will utilize a forward tone focused/resonant voice to decrease vocal hyperfunction and improve voice quality and vocal projection at the word level with 75% accuracy.  Baseline:  Goal status: INITIAL  4.  The patient will decrease laryngeal and articulatory muscle tension by independently completing relaxation/stretching exercises.  Baseline:  Goal status: INITIAL   LONG TERM GOALS: Target date: 01/24/2025  Patient will report improved communication effectiveness as measured by PROM Baseline:  Goal status: INITIAL  2.  With Supervision A, patient will use voice strategies at the sentence level in 80% of opportunities to  effectively communicate theirs wants and needs.  Baseline:  Goal status: INITIAL     ASSESSMENT:  CLINICAL IMPRESSION: Patient is a 63 y.o. female who was seen today for a voice treatment d/t severe dysphonia related to muscle tension dysphonia.  Pt's dysphonia is c/b hoarse, breathy, strained, tense, glottal fry with aphonia observed after talking for several minutes. As a result, pt is not able to readily communicate her wants and needs effectively.   Pt continues to arrive late to sessions and remains internally distracted by pain. Prognosis for improved is guarded.   OBJECTIVE IMPAIRMENTS include voice disorder. These impairments are limiting  patient from effectively communicating at home and in community. Factors affecting potential to achieve goals and functional outcome are severity of impairments. Patient will benefit from skilled SLP services to address above impairments and improve overall function.  REHAB POTENTIAL: Good  PLAN:   SLP FREQUENCY: 1-2x/week  SLP DURATION: 8 weeks  PLANNED INTERVENTIONS: SLP instruction and feedback, Compensatory strategies, and Patient/family education   Veola Cafaro B. Rubbie, M.S., CCC-SLP, CBIS Speech-Language Pathologist Certified Brain Injury Specialist [facility]  [facility] [phone] Ascom [phone] Fax [phone]   "
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
## 2024-12-20 ENCOUNTER — Ambulatory Visit (HOSPITAL_COMMUNITY): Admitting: Psychiatry

## 2024-12-20 DIAGNOSIS — F4310 Post-traumatic stress disorder, unspecified: Secondary | ICD-10-CM

## 2024-12-20 DIAGNOSIS — F339 Major depressive disorder, recurrent, unspecified: Secondary | ICD-10-CM

## 2024-12-20 DIAGNOSIS — F330 Major depressive disorder, recurrent, mild: Secondary | ICD-10-CM
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
## 2024-12-21 ENCOUNTER — Ambulatory Visit

## 2024-12-21 ENCOUNTER — Other Ambulatory Visit: Payer: Self-pay

## 2024-12-21 ENCOUNTER — Ambulatory Visit: Admitting: Occupational Therapy

## 2024-12-21 ENCOUNTER — Ambulatory Visit: Admitting: Speech Pathology

## 2024-12-21 DIAGNOSIS — M6281 Muscle weakness (generalized): Secondary | ICD-10-CM

## 2024-12-21 DIAGNOSIS — R2681 Unsteadiness on feet: Secondary | ICD-10-CM | POA: Diagnosis not present

## 2024-12-21 DIAGNOSIS — R490 Dysphonia: Secondary | ICD-10-CM
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
## 2024-12-21 NOTE — Therapy (Signed)
 " OUTPATIENT SPEECH LANGUAGE PATHOLOGY  VOICE TREATMENT NOTE   Patient Name: Jenny [name] MRN: [id number] DOB:03-17-1961, 63 y.o., female Today's Date: 12/21/2024  PCP: [name], MD REFERRING PROVIDER: Carolee Hunter, MD   End of Session - 12/21/24 0851     Visit Number 5    Number of Visits 17    Date for Recertification  01/24/25    Authorization Type [employer]    Authorization Time Period 11/29/2024 thru 02/27/2025    Authorization - Visit Number 5    Authorization - Number of Visits 16    Progress Note Due on Visit 10    SLP Start Time 0845    SLP Stop Time  0930    SLP Time Calculation (min) 45 min    Activity Tolerance Patient tolerated treatment well          Past Medical History:  Diagnosis Date   Anemia    Anxiety    Arthritis    Cancer (HCC)    colon cancer    Complication of anesthesia    Hard to wake up   Depression    Diabetes mellitus, type 2 (HCC)    Enlarged thyroid     Fatty liver    Past Surgical History:  Procedure Laterality Date   ANTERIOR CERVICAL DECOMP/DISCECTOMY FUSION N/A 06/12/2023   Procedure: ANTERIOR CERVICAL DISCECTOMY AND FUSION, CERVICAL THREE-CERVICAL FOUR;  Surgeon: Louis Shove, MD;  Location: [facility] OR;  Service: Neurosurgery;  Laterality: N/A;  3C   BOWEL RESECTION     CERVICAL SPINE SURGERY  06/17/2012   C5-C7 ACDF   CESAREAN SECTION     PARTIAL HYSTERECTOMY     PORT-A-CATH REMOVAL Left 07/28/2015   Procedure: REMOVAL PORT-A-CATH;  Surgeon: Bernarda Ned, MD;  Location: [facility];  Service: General;  Laterality: Left;   PORTACATH PLACEMENT Left 12/22/2014   Procedure: INSERTION PORT-A-CATH LEFT SUBCLAVIAN;  Surgeon: Bernarda Ned, MD;  Location: [facility];  Service: General;  Laterality: Left;   THYROIDECTOMY N/A 01/08/2024   Procedure: TOTAL THYROIDECTOMY;  Surgeon: Eletha Boas, MD;  Location: [facility];  Service: General;  Laterality: N/A;   TONSILLECTOMY     Patient Active Problem List   Diagnosis Date Noted    Gait instability 09/16/2024   Cervical spondylosis with myelopathy and radiculopathy 06/12/2023   Hypercholesteremia 09/27/2021   Moderate recurrent major depression (HCC) 12/23/2019   PTSD (post-traumatic stress disorder) 12/23/2019   Adjustment disorder with mixed anxiety and depressed mood 02/11/2018   Chemotherapy-induced peripheral neuropathy 01/06/2016   Type 2 diabetes mellitus without complications (HCC) 12/07/2015   History of thyroidectomy 12/14/2014   History of colon cancer 11/14/2014   Obesity 05/29/2009    ONSET DATE:  01/08/2024: Date of referral 08/13/2024: date of ENT referral 10/20/2024  REFERRING DIAG: R49.0 (ICD-10-CM) - Hoarseness   THERAPY DIAG:  Dysphonia  Rationale for Evaluation and Treatment Rehabilitation  SUBJECTIVE:   PERTINENT HISTORY and DIAGNOSTIC FINDINGS: Pt is a 63 year old female who presents with hoarseness following her total thyroidectomy on 01/08/2024 d/t  multinodular thyroid  goiter. Pt with history of dysphagia d/t ACDF and potential impact of goiter. Pt with past medical history of type 2 diabetes mellitus, ataxic gait, MAFLD, PTSD and memory changes.   Modified Barium Swallow Study - 06/16/2023 Pt presents with a moderate pharyngoesophageal dysphagia c/b reduced pharyngeal stripping wave, minimal epiglottic deflection presumed 2/2 hypopharyngeal edema, incomplete laryngeal closure, reduced UES opening with suspected CP bar (see images below), and esophageal retention and backflow. These  deficits resulted in sensed, trace penetration of thin liquid which pt was able to fully clear with spontaneous cough and reswallow. With nectar thick liquid, puree, and solid textures there was no penetration.   Modified Barium Swallow Study - 03/16/2020 Pt presents with adequate oropharyngeal abilities.   ENT Evaluation 10/20/2024 Laryngoscopy revealed: Edema and cobblestoning, interarytenoid notch: interarytenoid  Muscle Tension Dysphonia - Reflux  findings on exam as well as significant muscle tension dysphonia. Some mild edema of vibratory margin of vocal folds but no lesion. I think this may be some due to her significant squeezing. Good mobility of vocal folds with no obvious weakness.  Laryngopharyngeal reflux: Omeprazole  20mg  prior to first meal of the day.   PAIN:  Are you having pain? No   FALLS: Has patient fallen in last 6 months? No,   LIVING ENVIRONMENT: Lives with: lives with their family and lives with their spouse Lives in: House/apartment  PLOF: Independent  PATIENT GOALS    to improve her voice  SUBJECTIVE STATEMENT: Pt pleasant, pt arrived with all of her worksheets about voice, no questions at this time Pt accompanied by: self  OBJECTIVE:   TODAY'S TREATMENT NOTE: Skilled treatment session focused on pt's dysphonia goals. SLP facilitated session by providing the following interventions:  I am paying more attention to how much I talk and how much water that I drink Pt with overall much improved ability to participate in today's session.   SLP instructed pt in combination of flow phonation, easy onset transition to voicing as well as resonant voice. Pt with must improve vocal quality during resonant voice therapy at the single syllable word level targeting m - SLP made audio recording of HEP on pt's phone to improve accuracy of at home practice.    PATIENT EDUCATION: Education details: as above Person educated: Patient Education method: Explanation Education comprehension: verbalized understanding  GOALS: Goals reviewed with patient? Yes  SHORT TERM GOALS: Target date: 10 sessions  1.Pt will use abdominal breathing when producing single words to support phonation in 80% of trials across 3 data sessions.  Baseline: Goal status: INITIAL  2.  The patient will eliminate phonotraumatic behaviors such as chronic throat clearing, by substituting non-traumatic methods to clear mucus.  Baseline:  Goal  status: INITIAL  3.  The patient will utilize a forward tone focused/resonant voice to decrease vocal hyperfunction and improve voice quality and vocal projection at the word level with 75% accuracy.  Baseline:  Goal status: INITIAL  4.  The patient will decrease laryngeal and articulatory muscle tension by independently completing relaxation/stretching exercises.  Baseline:  Goal status: INITIAL   LONG TERM GOALS: Target date: 01/24/2025  Patient will report improved communication effectiveness as measured by PROM Baseline:  Goal status: INITIAL  2.  With Supervision A, patient will use voice strategies at the sentence level in 80% of opportunities to effectively communicate theirs wants and needs.  Baseline:  Goal status: INITIAL     ASSESSMENT:  CLINICAL IMPRESSION: Patient is a 63 y.o. female who was seen today for a voice treatment d/t severe dysphonia related to muscle tension dysphonia.  Pt's dysphonia is c/b hoarse, breathy, strained, tense, glottal fry with aphonia observed after talking for several minutes. As a result, pt is not able to readily communicate her wants and needs effectively.   Pt with improved response to therapy activities during today's session. See the above treatment note for details.   OBJECTIVE IMPAIRMENTS include voice disorder. These impairments are limiting patient  from effectively communicating at home and in community. Factors affecting potential to achieve goals and functional outcome are severity of impairments. Patient will benefit from skilled SLP services to address above impairments and improve overall function.  REHAB POTENTIAL: Good  PLAN:   SLP FREQUENCY: 1-2x/week  SLP DURATION: 8 weeks  PLANNED INTERVENTIONS: SLP instruction and feedback, Compensatory strategies, and Patient/family education   Garin Mata B. Rubbie, M.S., CCC-SLP, CBIS Speech-Language Pathologist Certified Brain Injury Specialist [facility]  [facility] [place] [phone] Ascom [phone] Fax [phone]   "

## 2024-12-22 MED ORDER — LEVOTHYROXINE SODIUM 100 MCG PO TABS: 100.0000 ug | ORAL_TABLET | Freq: Every day | ORAL | 3 refills | Status: DC

## 2024-12-28 ENCOUNTER — Ambulatory Visit: Admitting: Occupational Therapy

## 2024-12-28 ENCOUNTER — Ambulatory Visit: Admitting: Family Medicine

## 2024-12-28 ENCOUNTER — Encounter: Payer: Self-pay | Admitting: Family Medicine

## 2024-12-28 ENCOUNTER — Ambulatory Visit: Attending: Family Medicine | Admitting: Speech Pathology

## 2024-12-28 ENCOUNTER — Ambulatory Visit

## 2024-12-28 DIAGNOSIS — R2681 Unsteadiness on feet: Secondary | ICD-10-CM

## 2024-12-28 DIAGNOSIS — R278 Other lack of coordination: Secondary | ICD-10-CM | POA: Insufficient documentation

## 2024-12-28 DIAGNOSIS — E119 Type 2 diabetes mellitus without complications: Secondary | ICD-10-CM

## 2024-12-28 DIAGNOSIS — M542 Cervicalgia: Secondary | ICD-10-CM | POA: Insufficient documentation

## 2024-12-28 DIAGNOSIS — R293 Abnormal posture: Secondary | ICD-10-CM | POA: Insufficient documentation

## 2024-12-28 DIAGNOSIS — R2689 Other abnormalities of gait and mobility: Secondary | ICD-10-CM | POA: Insufficient documentation

## 2024-12-28 DIAGNOSIS — M4712 Other spondylosis with myelopathy, cervical region: Secondary | ICD-10-CM | POA: Insufficient documentation

## 2024-12-28 DIAGNOSIS — R490 Dysphonia: Secondary | ICD-10-CM | POA: Insufficient documentation

## 2024-12-28 DIAGNOSIS — M6281 Muscle weakness (generalized): Secondary | ICD-10-CM | POA: Insufficient documentation

## 2024-12-28 DIAGNOSIS — M4722 Other spondylosis with radiculopathy, cervical region: Secondary | ICD-10-CM | POA: Insufficient documentation

## 2024-12-28 MED ORDER — TIRZEPATIDE 10 MG/0.5ML ~~LOC~~ SOAJ: 10.0000 mg | SUBCUTANEOUS | 1 refills | Status: DC

## 2024-12-28 NOTE — Progress Notes (Signed)
" ° ° °  SUBJECTIVE:   CHIEF COMPLAINT / HPI:   Medication refill for mounjaro  - no side effects, maybe the occasional stomach cramp - drinking plenty of water - feels like she could probably go up but will discuss with Dr. Delores at their next visit.  - gained 6lbs over the holidays  PERTINENT  PMH / PSH: T2DM  OBJECTIVE:   BP 118/70   Pulse 74   Ht 5' 5 (1.651 m)   Wt 231 lb (104.8 kg)   SpO2 95%   BMI 38.44 kg/m   General: A&O, NAD Cardiac: RRR, no m/r/g Respiratory: CTAB, normal WOB, no w/c/r  ASSESSMENT/PLAN:   Assessment & Plan Type 2 diabetes mellitus without complication, without long-term current use of insulin  (HCC) - stable - refilled mounjaro  at current dose - follow up with Dr. Delores on 1/23 to discuss increasing dose if appropriate.    Jenny Pinal, DO [facility] "
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
## 2024-12-28 NOTE — Patient Instructions (Signed)
 It was wonderful to see you today!  Today we refilled your mounjaro . If you still feel like you need to increase at your next appointment with Dr. Delores, you can talk about changing the dose at that time.   Please call [phone] with any questions about today's appointment.   If you need any additional refills, please call your pharmacy before calling the office.  Lucie Pinal, DO Family Medicine

## 2024-12-28 NOTE — Therapy (Signed)
 " OUTPATIENT SPEECH LANGUAGE PATHOLOGY  VOICE TREATMENT NOTE   Patient Name: Jenny BISAILLON MRN: [id number] DOB:[birth year], 64 y.o., female Today's Date: 12/28/2024  PCP: [name], MD REFERRING PROVIDER: Carolee Hunter, MD   End of Session - 12/28/24 0854     Visit Number 6    Number of Visits 17    Date for Recertification  01/24/25    Authorization Type [employer]    Authorization Time Period 11/29/2024 thru 02/27/2025    Authorization - Visit Number 6    Authorization - Number of Visits 16    Progress Note Due on Visit 10    SLP Start Time 0857    SLP Stop Time  0930    SLP Time Calculation (min) 33 min    Activity Tolerance Patient tolerated treatment well          Past Medical History:  Diagnosis Date   Anemia    Anxiety    Arthritis    Cancer (HCC)    colon cancer    Complication of anesthesia    Hard to wake up   Depression    Diabetes mellitus, type 2 (HCC)    Enlarged thyroid     Fatty liver    Past Surgical History:  Procedure Laterality Date   ANTERIOR CERVICAL DECOMP/DISCECTOMY FUSION N/A 06/12/2023   Procedure: ANTERIOR CERVICAL DISCECTOMY AND FUSION, CERVICAL THREE-CERVICAL FOUR;  Surgeon: Louis Shove, MD;  Location: [facility] OR;  Service: Neurosurgery;  Laterality: N/A;  3C   BOWEL RESECTION     CERVICAL SPINE SURGERY  06/17/2012   C5-C7 ACDF   CESAREAN SECTION     PARTIAL HYSTERECTOMY     PORT-A-CATH REMOVAL Left 07/28/2015   Procedure: REMOVAL PORT-A-CATH;  Surgeon: Bernarda Ned, MD;  Location: [facility];  Service: General;  Laterality: Left;   PORTACATH PLACEMENT Left 12/22/2014   Procedure: INSERTION PORT-A-CATH LEFT SUBCLAVIAN;  Surgeon: Bernarda Ned, MD;  Location: [facility];  Service: General;  Laterality: Left;   THYROIDECTOMY N/A 01/08/2024   Procedure: TOTAL THYROIDECTOMY;  Surgeon: Eletha Boas, MD;  Location: [facility];  Service: General;  Laterality: N/A;   TONSILLECTOMY     Patient Active Problem List   Diagnosis Date Noted   Gait  instability 09/16/2024   Cervical spondylosis with myelopathy and radiculopathy 06/12/2023   Hypercholesteremia 09/27/2021   Moderate recurrent major depression (HCC) 12/23/2019   PTSD (post-traumatic stress disorder) 12/23/2019   Adjustment disorder with mixed anxiety and depressed mood 02/11/2018   Chemotherapy-induced peripheral neuropathy 01/06/2016   Type 2 diabetes mellitus without complications (HCC) 12/07/2015   History of thyroidectomy 12/14/2014   History of colon cancer 11/14/2014   Obesity 05/29/2009    ONSET DATE:  01/08/2024: Date of referral 08/13/2024: date of ENT referral 10/20/2024  REFERRING DIAG: R49.0 (ICD-10-CM) - Hoarseness   THERAPY DIAG:  Dysphonia  Rationale for Evaluation and Treatment Rehabilitation  SUBJECTIVE:   PERTINENT HISTORY and DIAGNOSTIC FINDINGS: Pt is a 63 year old female who presents with hoarseness following her total thyroidectomy on 01/08/2024 d/t  multinodular thyroid  goiter. Pt with history of dysphagia d/t ACDF and potential impact of goiter. Pt with past medical history of type 2 diabetes mellitus, ataxic gait, MAFLD, PTSD and memory changes.   Modified Barium Swallow Study - 06/16/2023 Pt presents with a moderate pharyngoesophageal dysphagia c/b reduced pharyngeal stripping wave, minimal epiglottic deflection presumed 2/2 hypopharyngeal edema, incomplete laryngeal closure, reduced UES opening with suspected CP bar (see images below), and esophageal retention and backflow. These  deficits resulted in sensed, trace penetration of thin liquid which pt was able to fully clear with spontaneous cough and reswallow. With nectar thick liquid, puree, and solid textures there was no penetration.   Modified Barium Swallow Study - 03/16/2020 Pt presents with adequate oropharyngeal abilities.   ENT Evaluation 10/20/2024 Laryngoscopy revealed: Edema and cobblestoning, interarytenoid notch: interarytenoid  Muscle Tension Dysphonia - Reflux findings on  exam as well as significant muscle tension dysphonia. Some mild edema of vibratory margin of vocal folds but no lesion. I think this may be some due to her significant squeezing. Good mobility of vocal folds with no obvious weakness.  Laryngopharyngeal reflux: Omeprazole  20mg  prior to first meal of the day.   PAIN:  Are you having pain? No   FALLS: Has patient fallen in last 6 months? No,   LIVING ENVIRONMENT: Lives with: lives with their family and lives with their spouse Lives in: House/apartment  PLOF: Independent  PATIENT GOALS    to improve her voice  SUBJECTIVE STATEMENT: Pt with increased hoarseness, appeared to have difficulty getting out of chair in waiting area Pt accompanied by: self  OBJECTIVE:   TODAY'S TREATMENT NOTE: Skilled treatment session focused on pt's dysphonia goals. SLP facilitated session by providing the following interventions:  Pt arrives to session was observed having difficulty getting out of chair in the reception area, limping down hallway (without her cane) pt described emotional situations at home and states I am trying not to spiral back downhill in depression. Recommend she reach out to her therapist prior to scheduled appt if needed. Her dysphonia appears worse today with increased gravely quality.   I love having it on my phone that really helped me practice.   Pt with difficulty achieving relaxed phonation during today's session despite maximal multimodal cues. Pt gave permission for this writer to reach out to her behavioral health therapist to gather additional baseline information regarding vocal quality in relationship to pt's overall emotional/physical health.   PATIENT EDUCATION: Education details: as above Person educated: Patient Education method: Explanation Education comprehension: verbalized understanding  GOALS: Goals reviewed with patient? Yes  SHORT TERM GOALS: Target date: 10 sessions  1.Pt will use abdominal  breathing when producing single words to support phonation in 80% of trials across 3 data sessions.  Baseline: Goal status: INITIAL  2.  The patient will eliminate phonotraumatic behaviors such as chronic throat clearing, by substituting non-traumatic methods to clear mucus.  Baseline:  Goal status: INITIAL  3.  The patient will utilize a forward tone focused/resonant voice to decrease vocal hyperfunction and improve voice quality and vocal projection at the word level with 75% accuracy.  Baseline:  Goal status: INITIAL  4.  The patient will decrease laryngeal and articulatory muscle tension by independently completing relaxation/stretching exercises.  Baseline:  Goal status: INITIAL   LONG TERM GOALS: Target date: 01/24/2025  Patient will report improved communication effectiveness as measured by PROM Baseline:  Goal status: INITIAL  2.  With Supervision A, patient will use voice strategies at the sentence level in 80% of opportunities to effectively communicate theirs wants and needs.  Baseline:  Goal status: INITIAL     ASSESSMENT:  CLINICAL IMPRESSION: Patient is a 64 y.o. female who was seen today for a voice treatment d/t severe dysphonia related to muscle tension dysphonia.  Pt's dysphonia is c/b hoarse, breathy, strained, tense, glottal fry with aphonia observed after talking for several minutes. As a result, pt is not able to readily communicate her  wants and needs effectively.   Pt's response to therapy activities is reduced d/t level of physical/emotional state during the session. See the above treatment note for details.   OBJECTIVE IMPAIRMENTS include voice disorder. These impairments are limiting patient from effectively communicating at home and in community. Factors affecting potential to achieve goals and functional outcome are severity of impairments. Patient will benefit from skilled SLP services to address above impairments and improve overall  function.  REHAB POTENTIAL: Good  PLAN:   SLP FREQUENCY: 1-2x/week  SLP DURATION: 8 weeks  PLANNED INTERVENTIONS: SLP instruction and feedback, Compensatory strategies, and Patient/family education   Saket Hellstrom B. Rubbie, M.S., CCC-SLP, CBIS Speech-Language Pathologist Certified Brain Injury Specialist [facility]  [facility] [phone] Ascom [phone] Fax [phone]   "

## 2024-12-28 NOTE — Assessment & Plan Note (Signed)
-   stable - refilled mounjaro  at current dose - follow up with Dr. Delores on 1/23 to discuss increasing dose if appropriate.
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
## 2025-01-03 ENCOUNTER — Ambulatory Visit (INDEPENDENT_AMBULATORY_CARE_PROVIDER_SITE_OTHER): Admitting: Psychiatry

## 2025-01-03 DIAGNOSIS — F329 Major depressive disorder, single episode, unspecified: Secondary | ICD-10-CM | POA: Diagnosis not present

## 2025-01-03 DIAGNOSIS — F4310 Post-traumatic stress disorder, unspecified: Secondary | ICD-10-CM
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
## 2025-01-04 ENCOUNTER — Ambulatory Visit: Admitting: Occupational Therapy

## 2025-01-04 ENCOUNTER — Ambulatory Visit: Admitting: Speech Pathology

## 2025-01-04 ENCOUNTER — Ambulatory Visit

## 2025-01-04 DIAGNOSIS — R278 Other lack of coordination: Secondary | ICD-10-CM

## 2025-01-04 DIAGNOSIS — M542 Cervicalgia: Secondary | ICD-10-CM

## 2025-01-04 DIAGNOSIS — R2689 Other abnormalities of gait and mobility: Secondary | ICD-10-CM

## 2025-01-04 DIAGNOSIS — R293 Abnormal posture: Secondary | ICD-10-CM

## 2025-01-04 DIAGNOSIS — M6281 Muscle weakness (generalized): Secondary | ICD-10-CM

## 2025-01-04 DIAGNOSIS — M4712 Other spondylosis with myelopathy, cervical region: Secondary | ICD-10-CM

## 2025-01-04 DIAGNOSIS — R2681 Unsteadiness on feet: Secondary | ICD-10-CM

## 2025-01-04 DIAGNOSIS — R490 Dysphonia: Secondary | ICD-10-CM
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
## 2025-01-04 NOTE — Therapy (Signed)
 " OUTPATIENT SPEECH LANGUAGE PATHOLOGY  VOICE TREATMENT NOTE   Patient Name: Jenny [name] MRN: [id number] DOB:08-06-1961, 64 y.o., female Today's Date: 01/04/2025  PCP: [name], MD REFERRING PROVIDER: Carolee Hunter, MD   End of Session - 01/04/25 0851     Visit Number 7    Number of Visits 17    Date for Recertification  01/24/25    Authorization Type [employer]    Authorization Time Period 11/29/2024 thru 02/27/2025    Authorization - Visit Number 7    Authorization - Number of Visits 16    Progress Note Due on Visit 10    SLP Start Time 0845    SLP Stop Time  0930    SLP Time Calculation (min) 45 min    Activity Tolerance Patient tolerated treatment well          Past Medical History:  Diagnosis Date   Anemia    Anxiety    Arthritis    Cancer (HCC)    colon cancer    Complication of anesthesia    Hard to wake up   Depression    Diabetes mellitus, type 2 (HCC)    Enlarged thyroid     Fatty liver    Past Surgical History:  Procedure Laterality Date   ANTERIOR CERVICAL DECOMP/DISCECTOMY FUSION N/A 06/12/2023   Procedure: ANTERIOR CERVICAL DISCECTOMY AND FUSION, CERVICAL THREE-CERVICAL FOUR;  Surgeon: Louis Shove, MD;  Location: [facility];  Service: Neurosurgery;  Laterality: N/A;  3C   BOWEL RESECTION     CERVICAL SPINE SURGERY  06/17/2012   C5-C7 ACDF   CESAREAN SECTION     PARTIAL HYSTERECTOMY     PORT-A-CATH REMOVAL Left 07/28/2015   Procedure: REMOVAL PORT-A-CATH;  Surgeon: Bernarda Ned, MD;  Location: [facility];  Service: General;  Laterality: Left;   PORTACATH PLACEMENT Left 12/22/2014   Procedure: INSERTION PORT-A-CATH LEFT SUBCLAVIAN;  Surgeon: Bernarda Ned, MD;  Location: [facility];  Service: General;  Laterality: Left;   THYROIDECTOMY N/A 01/08/2024   Procedure: TOTAL THYROIDECTOMY;  Surgeon: Eletha Boas, MD;  Location: [facility];  Service: General;  Laterality: N/A;   TONSILLECTOMY     Patient Active Problem List   Diagnosis Date Noted   Gait  instability 09/16/2024   Cervical spondylosis with myelopathy and radiculopathy 06/12/2023   Hypercholesteremia 09/27/2021   Moderate recurrent major depression (HCC) 12/23/2019   PTSD (post-traumatic stress disorder) 12/23/2019   Adjustment disorder with mixed anxiety and depressed mood 02/11/2018   Chemotherapy-induced peripheral neuropathy 01/06/2016   Type 2 diabetes mellitus without complications (HCC) 12/07/2015   History of thyroidectomy 12/14/2014   History of colon cancer 11/14/2014   Obesity 05/29/2009    ONSET DATE:  01/08/2024: Date of referral 08/13/2024: date of ENT referral 10/20/2024  REFERRING DIAG: R49.0 (ICD-10-CM) - Hoarseness   THERAPY DIAG:  Dysphonia  Rationale for Evaluation and Treatment Rehabilitation  SUBJECTIVE:   PERTINENT HISTORY and DIAGNOSTIC FINDINGS: Pt is a 64 year old female who presents with hoarseness following her total thyroidectomy on 01/08/2024 d/t  multinodular thyroid  goiter. Pt with history of dysphagia d/t ACDF and potential impact of goiter. Pt with past medical history of type 2 diabetes mellitus, ataxic gait, MAFLD, PTSD and memory changes.   Modified Barium Swallow Study - 06/16/2023 Pt presents with a moderate pharyngoesophageal dysphagia c/b reduced pharyngeal stripping wave, minimal epiglottic deflection presumed 2/2 hypopharyngeal edema, incomplete laryngeal closure, reduced UES opening with suspected CP bar (see images below), and esophageal retention and backflow. These  deficits resulted in sensed, trace penetration of thin liquid which pt was able to fully clear with spontaneous cough and reswallow. With nectar thick liquid, puree, and solid textures there was no penetration.   Modified Barium Swallow Study - 03/16/2020 Pt presents with adequate oropharyngeal abilities.   ENT Evaluation 10/20/2024 Laryngoscopy revealed: Edema and cobblestoning, interarytenoid notch: interarytenoid  Muscle Tension Dysphonia - Reflux findings on  exam as well as significant muscle tension dysphonia. Some mild edema of vibratory margin of vocal folds but no lesion. I think this may be some due to her significant squeezing. Good mobility of vocal folds with no obvious weakness.  Laryngopharyngeal reflux: Omeprazole  20mg  prior to first meal of the day.   PAIN:  Are you having pain? No   FALLS: Has patient fallen in last 6 months? No,   LIVING ENVIRONMENT: Lives with: lives with their family and lives with their spouse Lives in: House/apartment  PLOF: Independent  PATIENT GOALS    to improve her voice  SUBJECTIVE STATEMENT: Pt arrived with heat wrap around her neck - states that she is more prepared  Pt accompanied by: self  OBJECTIVE:   TODAY'S TREATMENT NOTE: Skilled treatment session focused on pt's dysphonia goals. SLP facilitated session by providing the following interventions:  Pt arrives to therapy with heat wrap around her neck.  Appears more relaxed. With verbal and visualization descriptions, pt able to achieve increased relaxation and as a result increased easy onset phonation.   Using resonant voice to continue to promote vibratory feedback, pt able to imitate lists/phrase/sentences with much improve accuracy ~ 95%. Some initial carryover observed in off the cuff comments.     PATIENT EDUCATION: Education details: as above Person educated: Patient Education method: Explanation Education comprehension: verbalized understanding  GOALS: Goals reviewed with patient? Yes  SHORT TERM GOALS: Target date: 10 sessions  1.Pt will use abdominal breathing when producing single words to support phonation in 80% of trials across 3 data sessions.  Baseline: Goal status: INITIAL  2.  The patient will eliminate phonotraumatic behaviors such as chronic throat clearing, by substituting non-traumatic methods to clear mucus.  Baseline:  Goal status: INITIAL  3.  The patient will utilize a forward tone  focused/resonant voice to decrease vocal hyperfunction and improve voice quality and vocal projection at the word level with 75% accuracy.  Baseline:  Goal status: INITIAL  4.  The patient will decrease laryngeal and articulatory muscle tension by independently completing relaxation/stretching exercises.  Baseline:  Goal status: INITIAL   LONG TERM GOALS: Target date: 01/24/2025  Patient will report improved communication effectiveness as measured by PROM Baseline:  Goal status: INITIAL  2.  With Supervision A, patient will use voice strategies at the sentence level in 80% of opportunities to effectively communicate theirs wants and needs.  Baseline:  Goal status: INITIAL     ASSESSMENT:  CLINICAL IMPRESSION: Patient is a 64 y.o. female who was seen today for a voice treatment d/t severe dysphonia related to muscle tension dysphonia.  Pt's dysphonia is c/b hoarse, breathy, strained, tense, glottal fry with aphonia observed after talking for several minutes. As a result, pt is not able to readily communicate her wants and needs effectively.   Pt with improved abilities during today's session.  See the above treatment note for details.   OBJECTIVE IMPAIRMENTS include voice disorder. These impairments are limiting patient from effectively communicating at home and in community. Factors affecting potential to achieve goals and functional outcome are severity of  impairments. Patient will benefit from skilled SLP services to address above impairments and improve overall function.  REHAB POTENTIAL: Good  PLAN:   SLP FREQUENCY: 1-2x/week  SLP DURATION: 8 weeks  PLANNED INTERVENTIONS: SLP instruction and feedback, Compensatory strategies, and Patient/family education   Nasha Diss B. Rubbie, M.S., CCC-SLP, CBIS Speech-Language Pathologist Certified Brain Injury Specialist [facility]  [facility] [phone] Ascom  [phone] Fax [phone]   "
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
## 2025-01-06 ENCOUNTER — Encounter: Payer: Self-pay | Admitting: Speech Pathology

## 2025-01-06 ENCOUNTER — Telehealth: Payer: Self-pay | Admitting: Speech Pathology

## 2025-01-06 ENCOUNTER — Ambulatory Visit: Admitting: Speech Pathology

## 2025-01-06 NOTE — Telephone Encounter (Signed)
 Pt was a no call/no show for today's ST session. This clinical research associate called and left a voicemail informing pt of missed session and information left on upcoming session next week.   Draper Gallon B. Rubbie, M.S., CCC-SLP, Tree Surgeon Certified Brain Injury Specialist [facility]  [facility] Services Office [phone] Ascom [phone] Fax [phone]

## 2025-01-11 ENCOUNTER — Ambulatory Visit

## 2025-01-11 ENCOUNTER — Ambulatory Visit: Admitting: Speech Pathology

## 2025-01-11 ENCOUNTER — Ambulatory Visit: Admitting: Occupational Therapy

## 2025-01-11 DIAGNOSIS — R278 Other lack of coordination: Secondary | ICD-10-CM

## 2025-01-11 DIAGNOSIS — R2681 Unsteadiness on feet: Secondary | ICD-10-CM

## 2025-01-11 DIAGNOSIS — R490 Dysphonia: Secondary | ICD-10-CM

## 2025-01-11 DIAGNOSIS — R293 Abnormal posture: Secondary | ICD-10-CM

## 2025-01-11 DIAGNOSIS — M6281 Muscle weakness (generalized): Secondary | ICD-10-CM

## 2025-01-11 DIAGNOSIS — M4722 Other spondylosis with radiculopathy, cervical region: Secondary | ICD-10-CM

## 2025-01-11 DIAGNOSIS — M542 Cervicalgia: Secondary | ICD-10-CM

## 2025-01-11 DIAGNOSIS — R2689 Other abnormalities of gait and mobility: Secondary | ICD-10-CM
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
## 2025-01-11 NOTE — Therapy (Signed)
 " OUTPATIENT SPEECH LANGUAGE PATHOLOGY  VOICE TREATMENT NOTE   Patient Name: Jenny [name] MRN: [id number] DOB:Jun 07, 1961, 64 y.o., female Today's Date: 01/11/2025  PCP: [name], MD REFERRING PROVIDER: Carolee Hunter, MD   End of Session - 01/11/25 1935     Visit Number 8    Number of Visits 17    Date for Recertification  01/24/25    Authorization Type [employer]    Authorization Time Period 11/29/2024 thru 02/27/2025    Authorization - Visit Number 8    Authorization - Number of Visits 16    Progress Note Due on Visit 10    SLP Start Time 1100    SLP Stop Time  1145    SLP Time Calculation (min) 45 min    Activity Tolerance Patient tolerated treatment well          Past Medical History:  Diagnosis Date   Anemia    Anxiety    Arthritis    Cancer (HCC)    colon cancer    Complication of anesthesia    Hard to wake up   Depression    Diabetes mellitus, type 2 (HCC)    Enlarged thyroid     Fatty liver    Past Surgical History:  Procedure Laterality Date   ANTERIOR CERVICAL DECOMP/DISCECTOMY FUSION N/A 06/12/2023   Procedure: ANTERIOR CERVICAL DISCECTOMY AND FUSION, CERVICAL THREE-CERVICAL FOUR;  Surgeon: Louis Shove, MD;  Location: [facility];  Service: Neurosurgery;  Laterality: N/A;  3C   BOWEL RESECTION     CERVICAL SPINE SURGERY  06/17/2012   C5-C7 ACDF   CESAREAN SECTION     PARTIAL HYSTERECTOMY     PORT-A-CATH REMOVAL Left 07/28/2015   Procedure: REMOVAL PORT-A-CATH;  Surgeon: Bernarda Ned, MD;  Location: [facility];  Service: General;  Laterality: Left;   PORTACATH PLACEMENT Left 12/22/2014   Procedure: INSERTION PORT-A-CATH LEFT SUBCLAVIAN;  Surgeon: Bernarda Ned, MD;  Location: [facility];  Service: General;  Laterality: Left;   THYROIDECTOMY N/A 01/08/2024   Procedure: TOTAL THYROIDECTOMY;  Surgeon: Eletha Boas, MD;  Location: [facility];  Service: General;  Laterality: N/A;   TONSILLECTOMY     Patient Active Problem List   Diagnosis Date Noted   Gait  instability 09/16/2024   Cervical spondylosis with myelopathy and radiculopathy 06/12/2023   Hypercholesteremia 09/27/2021   Moderate recurrent major depression (HCC) 12/23/2019   PTSD (post-traumatic stress disorder) 12/23/2019   Adjustment disorder with mixed anxiety and depressed mood 02/11/2018   Chemotherapy-induced peripheral neuropathy 01/06/2016   Type 2 diabetes mellitus without complications (HCC) 12/07/2015   History of thyroidectomy 12/14/2014   History of colon cancer 11/14/2014   Obesity 05/29/2009    ONSET DATE:  01/08/2024: Date of referral 08/13/2024: date of ENT referral 10/20/2024  REFERRING DIAG: R49.0 (ICD-10-CM) - Hoarseness   THERAPY DIAG:  Dysphonia  Rationale for Evaluation and Treatment Rehabilitation  SUBJECTIVE:   PERTINENT HISTORY and DIAGNOSTIC FINDINGS: Pt is a 64 year old female who presents with hoarseness following her total thyroidectomy on 01/08/2024 d/t  multinodular thyroid  goiter. Pt with history of dysphagia d/t ACDF and potential impact of goiter. Pt with past medical history of type 2 diabetes mellitus, ataxic gait, MAFLD, PTSD and memory changes.   Modified Barium Swallow Study - 06/16/2023 Pt presents with a moderate pharyngoesophageal dysphagia c/b reduced pharyngeal stripping wave, minimal epiglottic deflection presumed 2/2 hypopharyngeal edema, incomplete laryngeal closure, reduced UES opening with suspected CP bar (see images below), and esophageal retention and backflow. These  deficits resulted in sensed, trace penetration of thin liquid which pt was able to fully clear with spontaneous cough and reswallow. With nectar thick liquid, puree, and solid textures there was no penetration.   Modified Barium Swallow Study - 03/16/2020 Pt presents with adequate oropharyngeal abilities.   ENT Evaluation 10/20/2024 Laryngoscopy revealed: Edema and cobblestoning, interarytenoid notch: interarytenoid  Muscle Tension Dysphonia - Reflux findings on  exam as well as significant muscle tension dysphonia. Some mild edema of vibratory margin of vocal folds but no lesion. I think this may be some due to her significant squeezing. Good mobility of vocal folds with no obvious weakness.  Laryngopharyngeal reflux: Omeprazole  20mg  prior to first meal of the day.   PAIN:  Are you having pain? No   FALLS: Has patient fallen in last 6 months? No,   LIVING ENVIRONMENT: Lives with: lives with their family and lives with their spouse Lives in: House/apartment  PLOF: Independent  PATIENT GOALS    to improve her voice  SUBJECTIVE STATEMENT: Pt brought in her HEP, reports continued dedication to practice, especially practicing increased relaxation Pt accompanied by: self  OBJECTIVE:   TODAY'S TREATMENT NOTE: Skilled treatment session focused on pt's dysphonia goals. SLP facilitated session by providing the following interventions:  With verbal and visualization descriptions, pt able to achieve increased relaxation and as a result increased easy onset phonation.   Using resonant voice to continue to promote vibratory feedback, pt able to imitate lists/phrase/sentences with much improve accuracy ~ 95%. Some initial carryover observed in off the cuff comments and conversational questions. As length of responses increased, pt benefited from mildly increased cues for relaxed phonation.     PATIENT EDUCATION: Education details: as above Person educated: Patient Education method: Explanation Education comprehension: verbalized understanding  GOALS: Goals reviewed with patient? Yes  SHORT TERM GOALS: Target date: 10 sessions  1.Pt will use abdominal breathing when producing single words to support phonation in 80% of trials across 3 data sessions.  Baseline: Goal status: INITIAL  2.  The patient will eliminate phonotraumatic behaviors such as chronic throat clearing, by substituting non-traumatic methods to clear mucus.  Baseline:  Goal  status: INITIAL  3.  The patient will utilize a forward tone focused/resonant voice to decrease vocal hyperfunction and improve voice quality and vocal projection at the word level with 75% accuracy.  Baseline:  Goal status: INITIAL  4.  The patient will decrease laryngeal and articulatory muscle tension by independently completing relaxation/stretching exercises.  Baseline:  Goal status: INITIAL   LONG TERM GOALS: Target date: 01/24/2025  Patient will report improved communication effectiveness as measured by PROM Baseline:  Goal status: INITIAL  2.  With Supervision A, patient will use voice strategies at the sentence level in 80% of opportunities to effectively communicate theirs wants and needs.  Baseline:  Goal status: INITIAL     ASSESSMENT:  CLINICAL IMPRESSION: Patient is a 64 y.o. female who was seen today for a voice treatment d/t severe dysphonia related to muscle tension dysphonia.  Pt's dysphonia is c/b hoarse, breathy, strained, tense, glottal fry with aphonia observed after talking for several minutes. As a result, pt is not able to readily communicate her wants and needs effectively.   Pt with improved abilities during today's session.  See the above treatment note for details.   OBJECTIVE IMPAIRMENTS include voice disorder. These impairments are limiting patient from effectively communicating at home and in community. Factors affecting potential to achieve goals and functional outcome are severity  of impairments. Patient will benefit from skilled SLP services to address above impairments and improve overall function.  REHAB POTENTIAL: Good  PLAN:   SLP FREQUENCY: 1-2x/week  SLP DURATION: 8 weeks  PLANNED INTERVENTIONS: SLP instruction and feedback, Compensatory strategies, and Patient/family education   Nuno Brubacher B. Rubbie, M.S., CCC-SLP, CBIS Speech-Language Pathologist Certified Brain Injury Specialist [facility]  [facility] [phone] Ascom [phone] Fax [phone]   "

## 2025-01-12 NOTE — Addendum Note (Signed)
 Addended by: Darryl Willner M on: 01/12/2025 10:42 AM   Modules accepted: Orders
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
## 2025-01-13 ENCOUNTER — Ambulatory Visit: Admitting: Speech Pathology

## 2025-01-13 DIAGNOSIS — R490 Dysphonia: Secondary | ICD-10-CM

## 2025-01-13 NOTE — Progress Notes (Unsigned)
" ° ° °  SUBJECTIVE:   CHIEF COMPLAINT: diabetes HPI:   Jenny [name] is a 64 y.o.  with history notable for diabetes, spastic dysphonia, and history of colon cancer presenting for follow up.   Discussed the use of AI scribe software for clinical note transcription with the patient, who gave verbal consent to proceed.  History of Present Illness      PERTINENT  PMH / PSH/Family/Social History : diabetes, spastic dysphonia, colon cancer   OBJECTIVE:   There were no vitals taken for this visit.  Today's weight:  Review of prior weights: There were no vitals filed for this visit.   Cardiac: Regular rate and rhythm. Normal S1/S2. No murmurs, rubs, or gallops appreciated. Lungs: Clear bilaterally to ascultation.  Abdomen: Normoactive bowel sounds. No tenderness to deep or light palpation. No rebound or guarding.  ***  Psych: Pleasant and appropriate    ASSESSMENT/PLAN:   Assessment & Plan Type 2 diabetes mellitus without complication, without long-term current use of insulin  (HCC)  Chemotherapy-induced peripheral neuropathy     [name], MD  Family Medicine Teaching Service  [facility]   "

## 2025-01-13 NOTE — Therapy (Signed)
 " OUTPATIENT SPEECH LANGUAGE PATHOLOGY  VOICE TREATMENT NOTE   Patient Name: Jenny [name] MRN: [id number] DOB:26-Feb-1961, 64 y.o., female Today's Date: 01/13/2025  PCP: [name], MD REFERRING PROVIDER: Carolee Hunter, MD   End of Session - 01/13/25 1048     Visit Number 9    Number of Visits 17    Date for Recertification  01/24/25    Authorization Type [employer]    Authorization Time Period 11/29/2024 thru 02/27/2025    Authorization - Visit Number 9    Authorization - Number of Visits 16    Progress Note Due on Visit 10    SLP Start Time 1015    SLP Stop Time  1055    SLP Time Calculation (min) 40 min    Activity Tolerance Patient tolerated treatment well          Past Medical History:  Diagnosis Date   Anemia    Anxiety    Arthritis    Cancer (HCC)    colon cancer    Complication of anesthesia    Hard to wake up   Depression    Diabetes mellitus, type 2 (HCC)    Enlarged thyroid     Fatty liver    Past Surgical History:  Procedure Laterality Date   ANTERIOR CERVICAL DECOMP/DISCECTOMY FUSION N/A 06/12/2023   Procedure: ANTERIOR CERVICAL DISCECTOMY AND FUSION, CERVICAL THREE-CERVICAL FOUR;  Surgeon: Louis Shove, MD;  Location: [facility];  Service: Neurosurgery;  Laterality: N/A;  3C   BOWEL RESECTION     CERVICAL SPINE SURGERY  06/17/2012   C5-C7 ACDF   CESAREAN SECTION     PARTIAL HYSTERECTOMY     PORT-A-CATH REMOVAL Left 07/28/2015   Procedure: REMOVAL PORT-A-CATH;  Surgeon: Bernarda Ned, MD;  Location: [facility];  Service: General;  Laterality: Left;   PORTACATH PLACEMENT Left 12/22/2014   Procedure: INSERTION PORT-A-CATH LEFT SUBCLAVIAN;  Surgeon: Bernarda Ned, MD;  Location: [facility];  Service: General;  Laterality: Left;   THYROIDECTOMY N/A 01/08/2024   Procedure: TOTAL THYROIDECTOMY;  Surgeon: Eletha Boas, MD;  Location: [facility];  Service: General;  Laterality: N/A;   TONSILLECTOMY     Patient Active Problem List   Diagnosis Date Noted   Gait  instability 09/16/2024   Cervical spondylosis with myelopathy and radiculopathy 06/12/2023   Hypercholesteremia 09/27/2021   Moderate recurrent major depression (HCC) 12/23/2019   PTSD (post-traumatic stress disorder) 12/23/2019   Adjustment disorder with mixed anxiety and depressed mood 02/11/2018   Chemotherapy-induced peripheral neuropathy 01/06/2016   Type 2 diabetes mellitus without complications (HCC) 12/07/2015   History of thyroidectomy 12/14/2014   History of colon cancer 11/14/2014   Obesity 05/29/2009    ONSET DATE:  01/08/2024: Date of referral 08/13/2024: date of ENT referral 10/20/2024  REFERRING DIAG: R49.0 (ICD-10-CM) - Hoarseness   THERAPY DIAG:  Dysphonia  Rationale for Evaluation and Treatment Rehabilitation  SUBJECTIVE:   PERTINENT HISTORY and DIAGNOSTIC FINDINGS: Pt is a 64 year old female who presents with hoarseness following her total thyroidectomy on 01/08/2024 d/t  multinodular thyroid  goiter. Pt with history of dysphagia d/t ACDF and potential impact of goiter. Pt with past medical history of type 2 diabetes mellitus, ataxic gait, MAFLD, PTSD and memory changes.   Modified Barium Swallow Study - 06/16/2023 Pt presents with a moderate pharyngoesophageal dysphagia c/b reduced pharyngeal stripping wave, minimal epiglottic deflection presumed 2/2 hypopharyngeal edema, incomplete laryngeal closure, reduced UES opening with suspected CP bar (see images below), and esophageal retention and backflow. These  deficits resulted in sensed, trace penetration of thin liquid which pt was able to fully clear with spontaneous cough and reswallow. With nectar thick liquid, puree, and solid textures there was no penetration.   Modified Barium Swallow Study - 03/16/2020 Pt presents with adequate oropharyngeal abilities.   ENT Evaluation 10/20/2024 Laryngoscopy revealed: Edema and cobblestoning, interarytenoid notch: interarytenoid  Muscle Tension Dysphonia - Reflux findings on  exam as well as significant muscle tension dysphonia. Some mild edema of vibratory margin of vocal folds but no lesion. I think this may be some due to her significant squeezing. Good mobility of vocal folds with no obvious weakness.  Laryngopharyngeal reflux: Omeprazole  20mg  prior to first meal of the day.   PAIN:  Are you having pain? No   FALLS: Has patient fallen in last 6 months? No,   LIVING ENVIRONMENT: Lives with: lives with their family and lives with their spouse Lives in: House/apartment  PLOF: Independent  PATIENT GOALS    to improve her voice  SUBJECTIVE STATEMENT: Pt brought in her HEP, reports continued dedication to practice, especially practicing increased relaxation Pt accompanied by: self  OBJECTIVE:   TODAY'S TREATMENT NOTE: Skilled treatment session focused on pt's dysphonia goals. SLP facilitated session by providing the following interventions:  With verbal and visualization descriptions, pt able to achieve increased relaxation and as a result increased easy onset phonation.   Using resonant voice to continue to promote vibratory feedback, pt able to imitate lists/phrase/sentences with much improved accuracy ~ 95%. Some initial carryover observed in off the cuff comments and conversational questions. As length of responses increased, pt benefited from mildly increased cues for relaxed phonation and increased breath support.     PATIENT EDUCATION: Education details: as above Person educated: Patient Education method: Explanation Education comprehension: verbalized understanding  GOALS: Goals reviewed with patient? Yes  SHORT TERM GOALS: Target date: 10 sessions  1.Pt will use abdominal breathing when producing single words to support phonation in 80% of trials across 3 data sessions.  Baseline: Goal status: INITIAL  2.  The patient will eliminate phonotraumatic behaviors such as chronic throat clearing, by substituting non-traumatic methods to  clear mucus.  Baseline:  Goal status: INITIAL  3.  The patient will utilize a forward tone focused/resonant voice to decrease vocal hyperfunction and improve voice quality and vocal projection at the word level with 75% accuracy.  Baseline:  Goal status: INITIAL  4.  The patient will decrease laryngeal and articulatory muscle tension by independently completing relaxation/stretching exercises.  Baseline:  Goal status: INITIAL   LONG TERM GOALS: Target date: 01/24/2025  Patient will report improved communication effectiveness as measured by PROM Baseline:  Goal status: INITIAL  2.  With Supervision A, patient will use voice strategies at the sentence level in 80% of opportunities to effectively communicate theirs wants and needs.  Baseline:  Goal status: INITIAL     ASSESSMENT:  CLINICAL IMPRESSION: Patient is a 64 y.o. female who was seen today for a voice treatment d/t severe dysphonia related to muscle tension dysphonia.  Pt's dysphonia is c/b hoarse, breathy, strained, tense, glottal fry with aphonia observed after talking for several minutes. As a result, pt is not able to readily communicate her wants and needs effectively.   Pt with improved abilities during today's session.  See the above treatment note for details.   OBJECTIVE IMPAIRMENTS include voice disorder. These impairments are limiting patient from effectively communicating at home and in community. Factors affecting potential to achieve goals and  functional outcome are severity of impairments. Patient will benefit from skilled SLP services to address above impairments and improve overall function.  REHAB POTENTIAL: Good  PLAN:   SLP FREQUENCY: 1-2x/week  SLP DURATION: 8 weeks  PLANNED INTERVENTIONS: SLP instruction and feedback, Compensatory strategies, and Patient/family education   Avamae Dehaan B. Rubbie, M.S., CCC-SLP, CBIS Speech-Language Pathologist Certified Brain Injury Specialist [facility]   [facility] [phone] Ascom [phone] Fax [phone]   "

## 2025-01-14 ENCOUNTER — Ambulatory Visit: Admitting: Family Medicine

## 2025-01-14 ENCOUNTER — Encounter: Payer: Self-pay | Admitting: Family Medicine

## 2025-01-14 VITALS — BP 122/82 | HR 70 | Wt 227.0 lb

## 2025-01-14 DIAGNOSIS — D229 Melanocytic nevi, unspecified: Secondary | ICD-10-CM | POA: Diagnosis not present

## 2025-01-14 DIAGNOSIS — G620 Drug-induced polyneuropathy: Secondary | ICD-10-CM | POA: Diagnosis not present

## 2025-01-14 DIAGNOSIS — G44229 Chronic tension-type headache, not intractable: Secondary | ICD-10-CM | POA: Diagnosis not present

## 2025-01-14 DIAGNOSIS — E119 Type 2 diabetes mellitus without complications: Secondary | ICD-10-CM

## 2025-01-14 MED ORDER — MOUNJARO 12.5 MG/0.5ML ~~LOC~~ SOAJ: 12.5000 mg | SUBCUTANEOUS | 1 refills | Status: AC

## 2025-01-14 NOTE — Assessment & Plan Note (Signed)
Increased Mounjaro to 12.5 mg

## 2025-01-14 NOTE — Assessment & Plan Note (Signed)
 Already sees PT for gait May benefit from assistive device in future Neurology evaluation pending Will add 2 lights to bedroom, brainstormed with her today

## 2025-01-14 NOTE — Patient Instructions (Signed)
 It was wonderful to see you today.  Please bring ALL of your medications with you to every visit.   Today we talked about:  Stay warm and safe this weekend!   For your neck pain - I recommend gentle massage twice a day - You can use ice twice daily  - Tylenol  for severe pain  I sent in an increased dose of your Mounjaro   This is 12.5 mg You can start this on Sunday  Stop the 10 mg injections   Please follow up in 1  months   Thank you for choosing [facility].   Please call [phone] with any questions about today's appointment.  Please be sure to schedule follow up at the front  desk before you leave today.   [name], MD  Family Medicine

## 2025-01-18 ENCOUNTER — Ambulatory Visit: Admitting: Physical Therapy

## 2025-01-18 ENCOUNTER — Ambulatory Visit: Admitting: Occupational Therapy

## 2025-01-18 ENCOUNTER — Ambulatory Visit: Admitting: Speech Pathology

## 2025-01-20 ENCOUNTER — Ambulatory Visit

## 2025-01-21 ENCOUNTER — Ambulatory Visit: Admitting: Speech Pathology

## 2025-01-21 ENCOUNTER — Ambulatory Visit (HOSPITAL_COMMUNITY): Admitting: Psychiatry

## 2025-01-21 DIAGNOSIS — F330 Major depressive disorder, recurrent, mild: Secondary | ICD-10-CM

## 2025-01-21 DIAGNOSIS — R490 Dysphonia: Secondary | ICD-10-CM

## 2025-01-21 DIAGNOSIS — F4310 Post-traumatic stress disorder, unspecified: Secondary | ICD-10-CM
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
## 2025-01-21 NOTE — Therapy (Signed)
 " OUTPATIENT SPEECH LANGUAGE PATHOLOGY  VOICE TREATMENT NOTE 10th VISIT PROGRESS NOTE    Patient Name: KIMMI ACOCELLA MRN: [id number] DOB:[birth year], 64 y.o., female Today's Date: 01/21/2025  PCP: [name], MD REFERRING PROVIDER: Carolee Hunter, MD  Speech Therapy Progress Note  Dates of Reporting Period: 11/29/2024 to 01/21/2025  Objective: Patient has been seen for 10 speech therapy sessions this reporting period targeting pt moderate dysphonia. See skilled intervention, clinical impressions, and goals below for details.    End of Session - 01/21/25 1508     Visit Number 10    Number of Visits 17    Date for Recertification  01/24/25    Authorization Type [employer]    Authorization Time Period 11/29/2024 thru 02/27/2025    Authorization - Visit Number 10    Authorization - Number of Visits 16    Progress Note Due on Visit 10    SLP Start Time 0935    SLP Stop Time  1015    SLP Time Calculation (min) 40 min    Activity Tolerance Patient tolerated treatment well          Past Medical History:  Diagnosis Date   Anemia    Anxiety    Arthritis    Cancer (HCC)    colon cancer    Complication of anesthesia    Hard to wake up   Depression    Diabetes mellitus, type 2 (HCC)    Enlarged thyroid     Fatty liver    Past Surgical History:  Procedure Laterality Date   ANTERIOR CERVICAL DECOMP/DISCECTOMY FUSION N/A 06/12/2023   Procedure: ANTERIOR CERVICAL DISCECTOMY AND FUSION, CERVICAL THREE-CERVICAL FOUR;  Surgeon: Louis Shove, MD;  Location: [facility] OR;  Service: Neurosurgery;  Laterality: N/A;  3C   BOWEL RESECTION     CERVICAL SPINE SURGERY  06/17/2012   C5-C7 ACDF   CESAREAN SECTION     PARTIAL HYSTERECTOMY     PORT-A-CATH REMOVAL Left 07/28/2015   Procedure: REMOVAL PORT-A-CATH;  Surgeon: Bernarda Ned, MD;  Location: [facility];  Service: General;  Laterality: Left;   PORTACATH PLACEMENT Left 12/22/2014   Procedure: INSERTION PORT-A-CATH LEFT SUBCLAVIAN;   Surgeon: Bernarda Ned, MD;  Location: [facility];  Service: General;  Laterality: Left;   THYROIDECTOMY N/A 01/08/2024   Procedure: TOTAL THYROIDECTOMY;  Surgeon: Eletha Boas, MD;  Location: [facility];  Service: General;  Laterality: N/A;   TONSILLECTOMY     Patient Active Problem List   Diagnosis Date Noted   Gait instability 09/16/2024   Cervical spondylosis with myelopathy and radiculopathy 06/12/2023   Hypercholesteremia 09/27/2021   Moderate recurrent major depression (HCC) 12/23/2019   PTSD (post-traumatic stress disorder) 12/23/2019   Adjustment disorder with mixed anxiety and depressed mood 02/11/2018   Chemotherapy-induced peripheral neuropathy 01/06/2016   Type 2 diabetes mellitus without complications (HCC) 12/07/2015   History of thyroidectomy 12/14/2014   History of colon cancer 11/14/2014   Obesity 05/29/2009    ONSET DATE:  01/08/2024: Date of referral 08/13/2024: date of ENT referral 10/20/2024  REFERRING DIAG: R49.0 (ICD-10-CM) - Hoarseness   THERAPY DIAG:  Dysphonia  Rationale for Evaluation and Treatment Rehabilitation  SUBJECTIVE:   PERTINENT HISTORY and DIAGNOSTIC FINDINGS: Pt is a 64 year old female who presents with hoarseness following her total thyroidectomy on 01/08/2024 d/t  multinodular thyroid  goiter. Pt with history of dysphagia d/t ACDF and potential impact of goiter. Pt with past medical history of type 2 diabetes mellitus, ataxic gait, MAFLD, PTSD and memory  changes.   Modified Barium Swallow Study - 06/16/2023 Pt presents with a moderate pharyngoesophageal dysphagia c/b reduced pharyngeal stripping wave, minimal epiglottic deflection presumed 2/2 hypopharyngeal edema, incomplete laryngeal closure, reduced UES opening with suspected CP bar (see images below), and esophageal retention and backflow. These deficits resulted in sensed, trace penetration of thin liquid which pt was able to fully clear with spontaneous cough and reswallow. With nectar thick  liquid, puree, and solid textures there was no penetration.   Modified Barium Swallow Study - 03/16/2020 Pt presents with adequate oropharyngeal abilities.   ENT Evaluation 10/20/2024 Laryngoscopy revealed: Edema and cobblestoning, interarytenoid notch: interarytenoid  Muscle Tension Dysphonia - Reflux findings on exam as well as significant muscle tension dysphonia. Some mild edema of vibratory margin of vocal folds but no lesion. I think this may be some due to her significant squeezing. Good mobility of vocal folds with no obvious weakness.  Laryngopharyngeal reflux: Omeprazole  20mg  prior to first meal of the day.   PAIN:  Are you having pain? No   FALLS: Has patient fallen in last 6 months? No,   LIVING ENVIRONMENT: Lives with: lives with their family and lives with their spouse Lives in: House/apartment  PLOF: Independent  PATIENT GOALS    to improve her voice  SUBJECTIVE STATEMENT: Pt reports reduced completion of HEP  - reports practicing ~ 3 times since last session on 01/13/2025 Pt accompanied by: self  OBJECTIVE:   TODAY'S TREATMENT NOTE: Skilled treatment session focused on pt's dysphonia goals. SLP facilitated session by providing the following interventions:  With moderate verbal and visualization descriptions, pt able to achieve increased relaxation and as a result increased easy onset phonation.   Using resonant voice to continue to promote vibratory feedback, pt able to read lists of phrases and sentences with moderate verbal cues to achieve ~ 75% improved vocal quality. . Reduced carryover observed within conversation and off the cuff comments suspect d/t reduced practice outside of ST sessions.   Education provided on number of remaining visits under current insurance authorization.     PATIENT EDUCATION: Education details: as above Person educated: Patient Education method: Explanation Education comprehension: verbalized  understanding  GOALS: Goals reviewed with patient? Yes  SHORT TERM GOALS: Target date: 10 sessions Updated 01/21/2025 1.Pt will use abdominal breathing when producing single words to support phonation in 80% of trials across 3 data sessions.  Baseline: Goal status: INITIAL: MET - upgraded to Mod A at sentence level  2.  The patient will eliminate phonotraumatic behaviors such as chronic throat clearing, by substituting non-traumatic methods to clear mucus.  Baseline:  Goal status: INITIAL: progress made  3.  The patient will utilize a forward tone focused/resonant voice to decrease vocal hyperfunction and improve voice quality and vocal projection at the word level with 75% accuracy.  Baseline:  Goal status: INITIAL: MET - upgraded to Min A at the sentence level  4.  The patient will decrease laryngeal and articulatory muscle tension by independently completing relaxation/stretching exercises.  Baseline:  Goal status: INITIAL: progress made   LONG TERM GOALS: Target date: 01/24/2025 Updated 01/21/2025 Patient will report improved communication effectiveness as measured by PROM Baseline:  Goal status: INITIAL: progress made; pt voices improvement  2.  With Supervision A, patient will use voice strategies at the sentence level in 80% of opportunities to effectively communicate theirs wants and needs.  Baseline:  Goal status: INITIAL: slower than anticipated progress     ASSESSMENT:  CLINICAL IMPRESSION: Patient is a  64 y.o. female who was seen today for a voice treatment d/t severe dysphonia related to muscle tension dysphonia.  Pt's dysphonia is c/b hoarse, breathy, strained, tense, glottal fry with aphonia observed after talking for several minutes. As a result, pt is not able to readily communicate her wants and needs effectively.   Pt has made slower than anticipated progress over the last 10 sessions. While pt voices understanding of concepts taught within sessions - lack  of completion of HEP, pain, fatigue and inconsistent ability to tolerate sessions d/t these have made progress more difficult.  See the above treatment note for details.   OBJECTIVE IMPAIRMENTS include voice disorder. These impairments are limiting patient from effectively communicating at home and in community. Factors affecting potential to achieve goals and functional outcome are severity of impairments. Patient will benefit from skilled SLP services to address above impairments and improve overall function.  REHAB POTENTIAL: Good  PLAN:   SLP FREQUENCY: 1-2x/week  SLP DURATION: 8 weeks  PLANNED INTERVENTIONS: SLP instruction and feedback, Compensatory strategies, and Patient/family education   Briggitte Boline B. Rubbie, M.S., CCC-SLP, CBIS Speech-Language Pathologist Certified Brain Injury Specialist [facility]  [facility] [phone] Ascom [phone] Fax [phone]   "

## 2025-01-22 ENCOUNTER — Other Ambulatory Visit: Payer: Self-pay | Admitting: Family Medicine
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
## 2025-01-25 ENCOUNTER — Ambulatory Visit: Admitting: Speech Pathology

## 2025-01-25 ENCOUNTER — Ambulatory Visit: Admitting: Occupational Therapy

## 2025-01-25 ENCOUNTER — Ambulatory Visit: Admitting: Physical Therapy

## 2025-01-26 ENCOUNTER — Encounter: Payer: Self-pay | Admitting: Psychiatry

## 2025-01-26 ENCOUNTER — Telehealth: Admitting: Psychiatry

## 2025-01-26 DIAGNOSIS — G4700 Insomnia, unspecified: Secondary | ICD-10-CM | POA: Diagnosis not present

## 2025-01-26 DIAGNOSIS — F3341 Major depressive disorder, recurrent, in partial remission: Secondary | ICD-10-CM

## 2025-01-26 DIAGNOSIS — F4310 Post-traumatic stress disorder, unspecified: Secondary | ICD-10-CM

## 2025-01-26 NOTE — Patient Instructions (Signed)
 Continue venlafaxine  187.5  mg daily  Continue bupropion  450 mg (300 mg + 150 mg) daily Decrease Abilify  5 mg daily  Continue trazodone  25-50 mg at night as needed for sleep Next appointment: 2/4 at 11:30

## 2025-01-27 ENCOUNTER — Ambulatory Visit: Admitting: Speech Pathology

## 2025-01-28 ENCOUNTER — Ambulatory Visit: Admitting: Neurology

## 2025-01-28 ENCOUNTER — Encounter: Payer: Self-pay | Admitting: Neurology

## 2025-01-28 VITALS — BP 135/84 | HR 65 | Ht 65.0 in | Wt 228.8 lb

## 2025-01-28 DIAGNOSIS — Z9889 Other specified postprocedural states: Secondary | ICD-10-CM

## 2025-01-28 DIAGNOSIS — M5440 Lumbago with sciatica, unspecified side: Secondary | ICD-10-CM

## 2025-01-28 DIAGNOSIS — G620 Drug-induced polyneuropathy: Secondary | ICD-10-CM

## 2025-01-28 DIAGNOSIS — M4712 Other spondylosis with myelopathy, cervical region: Secondary | ICD-10-CM

## 2025-01-28 DIAGNOSIS — R41840 Attention and concentration deficit: Secondary | ICD-10-CM

## 2025-01-28 DIAGNOSIS — Z85038 Personal history of other malignant neoplasm of large intestine: Secondary | ICD-10-CM

## 2025-01-28 NOTE — Patient Instructions (Signed)
" °  Dear Jenny Suzann HERO, Md [address] [place],  [place] [address],   Thank you for entrusting me with your patient's care.   As you know, your patient  Jenny [name] a 64 y.o. female presented here on 01-28-2025 for an Evaluation of  Memory impairment and balance issues upon your request .  We concentrated on memory dysfunction, obtained an educational, social and health history : the patient reports symptoms of ADHD , but she used to compensate easily for these, now she feels overwhelmed and distracted.   Her granddaughters have ADHD, there is a chance she has it too, see also educational history- she took a year longer to get her bachelor degree, procrastination tendencies were there.   I have still added the dementia panel. Her MOCA was 25/ 30 , would correlate to MCI>  her fathers medical history is not known and she has no full-siblings, but  among her half-siblings was nobody with  dementia/ memory loss.   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 She limps.  This is a gait disorder due to back problem , right hip, knee ,  more  than a  a balance issue.   Reviewed MRI lumbar spine and Brain- brain is normal.  Lumbar arthritis with midline pain,  knee's buckle- that fits L4-5.    My Plan is to proceed with:    1) GNA DEMENTIA PANEL  2) ATN 3) EEG 4) MRI brain with and without , if not already done ( was done  and was reviewed )  5 ) Referral to neuropsychology for cognitive testing.( I would like to get recommendations about adult ADHD testing if there is an office in the community) .  6)  PT for gait and back pain.  7) Mounjaro  - a great step for DM control and weight loss. That's helping her joints too.    PCP; Please  refer for vision and audiology testing if applicable.   The plan is to identify cognitive domains affected by changes, to rule out brain lesions or vascular abnormalities, to obtain testing of  AD  bio-markers, genetic markers and , if applicable , consolidate these findings by a PET scan.  Serial testing  by [facility] or [facility]SE test is needed for all patients that are currently scoring in the subjective memory loss, MCI, or mild dementia category.   A referral for neuropsychological interview and testing battery has been ordered.  Medication to help slow cognitive decline are available but may not be tolerated, these are available in oral and patch form and their use is not specific to a single form of neurodegenerative cognitive disorder.    I plan to follow up through our NP or personally within 5-6 month.   "

## 2025-01-28 NOTE — Progress Notes (Signed)
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 capsule (37.5 mg total) by mouth daily. TAKE 1 CAPSULE BY MOUTH DAILY ALONG WITH THE 150 MG FOR TOTAL DAILY DOSE OF 187.5 MG 90 capsule 1   No current facility-administered medications on file prior to visit.  [2]  Allergies Allergen Reactions   Other Rash    *Derma Bond*   Attends Briefs Small Other (See Comments)   Shellfish Allergy Rash

## 2025-02-01 ENCOUNTER — Ambulatory Visit: Admitting: Speech Pathology

## 2025-02-01 ENCOUNTER — Ambulatory Visit: Admitting: Physical Therapy

## 2025-02-01 ENCOUNTER — Ambulatory Visit: Attending: Family Medicine | Admitting: Occupational Therapy

## 2025-02-02 ENCOUNTER — Other Ambulatory Visit: Admitting: *Deleted

## 2025-02-03 ENCOUNTER — Ambulatory Visit: Admitting: Speech Pathology

## 2025-02-08 ENCOUNTER — Ambulatory Visit: Admitting: Speech Pathology

## 2025-02-08 ENCOUNTER — Ambulatory Visit: Admitting: Occupational Therapy

## 2025-02-08 ENCOUNTER — Ambulatory Visit: Admitting: Physical Therapy

## 2025-02-10 ENCOUNTER — Ambulatory Visit: Admitting: Speech Pathology

## 2025-02-14 ENCOUNTER — Ambulatory Visit: Admitting: Family Medicine

## 2025-02-14 ENCOUNTER — Ambulatory Visit (HOSPITAL_COMMUNITY): Admitting: Psychiatry

## 2025-02-15 ENCOUNTER — Ambulatory Visit: Admitting: Speech Pathology

## 2025-02-15 ENCOUNTER — Ambulatory Visit

## 2025-02-15 ENCOUNTER — Ambulatory Visit: Admitting: Occupational Therapy

## 2025-02-17 ENCOUNTER — Ambulatory Visit: Admitting: Speech Pathology

## 2025-02-22 ENCOUNTER — Ambulatory Visit

## 2025-02-22 ENCOUNTER — Ambulatory Visit: Attending: Family Medicine | Admitting: Speech Pathology

## 2025-02-22 ENCOUNTER — Ambulatory Visit: Admitting: Occupational Therapy

## 2025-02-24 ENCOUNTER — Ambulatory Visit: Admitting: Speech Pathology

## 2025-03-01 ENCOUNTER — Ambulatory Visit: Admitting: Physical Therapy

## 2025-03-01 ENCOUNTER — Ambulatory Visit: Admitting: Neurology

## 2025-03-01 ENCOUNTER — Ambulatory Visit: Admitting: Speech Pathology

## 2025-03-01 ENCOUNTER — Ambulatory Visit: Admitting: Occupational Therapy

## 2025-03-03 ENCOUNTER — Ambulatory Visit: Admitting: Speech Pathology

## 2025-03-08 ENCOUNTER — Ambulatory Visit: Admitting: Physical Therapy

## 2025-03-08 ENCOUNTER — Ambulatory Visit: Admitting: Occupational Therapy

## 2025-03-08 ENCOUNTER — Ambulatory Visit: Admitting: Speech Pathology

## 2025-03-10 ENCOUNTER — Ambulatory Visit: Admitting: Physical Therapy

## 2025-03-10 ENCOUNTER — Ambulatory Visit: Admitting: Occupational Therapy

## 2025-03-10 ENCOUNTER — Ambulatory Visit: Admitting: Speech Pathology

## 2025-03-15 ENCOUNTER — Ambulatory Visit: Admitting: Speech Pathology

## 2025-03-15 ENCOUNTER — Ambulatory Visit: Admitting: Occupational Therapy

## 2025-03-15 ENCOUNTER — Ambulatory Visit: Admitting: Physical Therapy

## 2025-03-17 ENCOUNTER — Ambulatory Visit: Admitting: Speech Pathology

## 2025-03-17 ENCOUNTER — Ambulatory Visit: Admitting: Occupational Therapy

## 2025-03-17 ENCOUNTER — Ambulatory Visit: Admitting: Physical Therapy

## 2025-03-22 ENCOUNTER — Ambulatory Visit: Admitting: Speech Pathology

## 2025-03-22 ENCOUNTER — Ambulatory Visit: Admitting: Occupational Therapy

## 2025-03-22 ENCOUNTER — Ambulatory Visit: Admitting: Physical Therapy

## 2025-03-24 ENCOUNTER — Ambulatory Visit: Admitting: Occupational Therapy

## 2025-03-24 ENCOUNTER — Ambulatory Visit: Admitting: Physical Therapy

## 2025-03-24 ENCOUNTER — Ambulatory Visit: Attending: Family Medicine | Admitting: Speech Pathology

## 2025-03-28 ENCOUNTER — Telehealth: Admitting: Psychiatry

## 2025-03-29 ENCOUNTER — Ambulatory Visit: Admitting: Speech Pathology

## 2025-03-29 ENCOUNTER — Ambulatory Visit: Admitting: Physical Therapy

## 2025-03-29 ENCOUNTER — Ambulatory Visit: Admitting: Occupational Therapy

## 2025-03-31 ENCOUNTER — Ambulatory Visit: Admitting: Speech Pathology

## 2025-04-05 ENCOUNTER — Ambulatory Visit: Admitting: Speech Pathology

## 2025-04-05 ENCOUNTER — Ambulatory Visit: Admitting: Occupational Therapy

## 2025-04-05 ENCOUNTER — Ambulatory Visit: Admitting: Physical Therapy

## 2025-04-07 ENCOUNTER — Ambulatory Visit: Admitting: Speech Pathology

## 2025-04-12 ENCOUNTER — Ambulatory Visit: Admitting: Speech Pathology

## 2025-04-12 ENCOUNTER — Ambulatory Visit: Admitting: Occupational Therapy

## 2025-04-12 ENCOUNTER — Ambulatory Visit: Admitting: Physical Therapy

## 2025-04-14 ENCOUNTER — Ambulatory Visit: Admitting: Speech Pathology

## 2025-04-19 ENCOUNTER — Ambulatory Visit: Admitting: Physical Therapy

## 2025-04-19 ENCOUNTER — Ambulatory Visit: Admitting: Occupational Therapy

## 2025-04-19 ENCOUNTER — Ambulatory Visit: Admitting: Speech Pathology

## 2025-04-21 ENCOUNTER — Ambulatory Visit: Admitting: Speech Pathology

## 2025-04-26 ENCOUNTER — Ambulatory Visit: Attending: Family Medicine | Admitting: Speech Pathology

## 2025-04-26 ENCOUNTER — Ambulatory Visit: Admitting: Occupational Therapy

## 2025-04-26 ENCOUNTER — Ambulatory Visit: Admitting: Physical Therapy

## 2025-04-28 ENCOUNTER — Ambulatory Visit: Admitting: Speech Pathology

## 2025-05-03 ENCOUNTER — Ambulatory Visit: Admitting: Occupational Therapy

## 2025-05-03 ENCOUNTER — Ambulatory Visit: Admitting: Speech Pathology

## 2025-05-03 ENCOUNTER — Ambulatory Visit: Admitting: Physical Therapy

## 2025-05-05 ENCOUNTER — Ambulatory Visit: Admitting: Speech Pathology

## 2025-05-10 ENCOUNTER — Ambulatory Visit: Admitting: Speech Pathology

## 2025-05-10 ENCOUNTER — Ambulatory Visit: Admitting: Physical Therapy

## 2025-05-10 ENCOUNTER — Ambulatory Visit: Admitting: Occupational Therapy

## 2025-05-12 ENCOUNTER — Ambulatory Visit: Admitting: Speech Pathology

## 2025-07-28 ENCOUNTER — Ambulatory Visit: Admitting: Neurology
